# Patient Record
Sex: Female | Born: 1935 | Race: White | Hispanic: No | Marital: Married | Smoking: Never smoker
Health system: Southern US, Community
[De-identification: ages and names within clinical notes are randomized; demographics above are authoritative.]

## PROBLEM LIST (undated history)

## (undated) DIAGNOSIS — I119 Hypertensive heart disease without heart failure: Secondary | ICD-10-CM

## (undated) DIAGNOSIS — I480 Paroxysmal atrial fibrillation: Secondary | ICD-10-CM

## (undated) DIAGNOSIS — N183 Chronic kidney disease, stage 3 unspecified: Secondary | ICD-10-CM

## (undated) DIAGNOSIS — E119 Type 2 diabetes mellitus without complications: Secondary | ICD-10-CM

## (undated) DIAGNOSIS — M1990 Unspecified osteoarthritis, unspecified site: Secondary | ICD-10-CM

## (undated) DIAGNOSIS — S2231XA Fracture of one rib, right side, initial encounter for closed fracture: Secondary | ICD-10-CM

## (undated) DIAGNOSIS — J45909 Unspecified asthma, uncomplicated: Secondary | ICD-10-CM

## (undated) DIAGNOSIS — S4292XA Fracture of left shoulder girdle, part unspecified, initial encounter for closed fracture: Secondary | ICD-10-CM

## (undated) DIAGNOSIS — E785 Hyperlipidemia, unspecified: Secondary | ICD-10-CM

## (undated) DIAGNOSIS — M069 Rheumatoid arthritis, unspecified: Secondary | ICD-10-CM

## (undated) DIAGNOSIS — I639 Cerebral infarction, unspecified: Secondary | ICD-10-CM

## (undated) DIAGNOSIS — I2510 Atherosclerotic heart disease of native coronary artery without angina pectoris: Secondary | ICD-10-CM

## (undated) DIAGNOSIS — I5032 Chronic diastolic (congestive) heart failure: Secondary | ICD-10-CM

## (undated) DIAGNOSIS — K219 Gastro-esophageal reflux disease without esophagitis: Secondary | ICD-10-CM

## (undated) DIAGNOSIS — E669 Obesity, unspecified: Secondary | ICD-10-CM

## (undated) DIAGNOSIS — E039 Hypothyroidism, unspecified: Secondary | ICD-10-CM

## (undated) HISTORY — DX: Rheumatoid arthritis, unspecified: M06.9

## (undated) HISTORY — DX: Atherosclerotic heart disease of native coronary artery without angina pectoris: I25.10

## (undated) HISTORY — DX: Hypothyroidism, unspecified: E03.9

## (undated) HISTORY — DX: Cerebral infarction, unspecified: I63.9

## (undated) HISTORY — PX: OTHER SURGICAL HISTORY: SHX169

## (undated) HISTORY — DX: Hyperlipidemia, unspecified: E78.5

## (undated) HISTORY — DX: Fracture of left shoulder girdle, part unspecified, initial encounter for closed fracture: S42.92XA

## (undated) HISTORY — DX: Type 2 diabetes mellitus without complications: E11.9

## (undated) HISTORY — DX: Obesity, unspecified: E66.9

## (undated) HISTORY — PX: CORONARY ANGIOPLASTY: SHX604

## (undated) HISTORY — DX: Gastro-esophageal reflux disease without esophagitis: K21.9

## (undated) HISTORY — DX: Unspecified osteoarthritis, unspecified site: M19.90

---

## 1998-05-20 HISTORY — PX: OTHER SURGICAL HISTORY: SHX169

## 1999-02-02 ENCOUNTER — Ambulatory Visit (HOSPITAL_COMMUNITY): Admission: RE | Admit: 1999-02-02 | Discharge: 1999-02-03 | Payer: Self-pay | Admitting: Cardiology

## 1999-02-02 ENCOUNTER — Encounter: Payer: Self-pay | Admitting: Cardiology

## 2003-05-21 HISTORY — PX: OTHER SURGICAL HISTORY: SHX169

## 2003-08-21 ENCOUNTER — Inpatient Hospital Stay (HOSPITAL_COMMUNITY): Admission: EM | Admit: 2003-08-21 | Discharge: 2003-08-23 | Payer: Self-pay | Admitting: Cardiology

## 2004-03-29 ENCOUNTER — Ambulatory Visit: Payer: Self-pay | Admitting: Internal Medicine

## 2004-06-27 ENCOUNTER — Ambulatory Visit: Payer: Self-pay | Admitting: Family Medicine

## 2004-07-05 ENCOUNTER — Ambulatory Visit: Payer: Self-pay | Admitting: Internal Medicine

## 2004-07-19 ENCOUNTER — Ambulatory Visit: Payer: Self-pay

## 2004-07-19 ENCOUNTER — Ambulatory Visit: Payer: Self-pay | Admitting: Internal Medicine

## 2004-07-20 ENCOUNTER — Ambulatory Visit: Payer: Self-pay

## 2005-06-17 ENCOUNTER — Ambulatory Visit: Payer: Self-pay | Admitting: Internal Medicine

## 2005-08-12 ENCOUNTER — Ambulatory Visit: Payer: Self-pay | Admitting: Internal Medicine

## 2005-12-16 ENCOUNTER — Ambulatory Visit: Payer: Self-pay | Admitting: Family Medicine

## 2005-12-16 ENCOUNTER — Inpatient Hospital Stay (HOSPITAL_COMMUNITY): Admission: EM | Admit: 2005-12-16 | Discharge: 2005-12-19 | Payer: Self-pay | Admitting: Emergency Medicine

## 2006-05-28 ENCOUNTER — Ambulatory Visit: Payer: Self-pay | Admitting: Cardiology

## 2006-05-29 ENCOUNTER — Inpatient Hospital Stay (HOSPITAL_BASED_OUTPATIENT_CLINIC_OR_DEPARTMENT_OTHER): Admission: RE | Admit: 2006-05-29 | Discharge: 2006-05-29 | Payer: Self-pay | Admitting: Internal Medicine

## 2006-05-29 ENCOUNTER — Ambulatory Visit: Payer: Self-pay | Admitting: Internal Medicine

## 2006-06-04 ENCOUNTER — Ambulatory Visit (HOSPITAL_COMMUNITY): Admission: RE | Admit: 2006-06-04 | Discharge: 2006-06-05 | Payer: Self-pay | Admitting: Cardiology

## 2006-06-04 ENCOUNTER — Ambulatory Visit: Payer: Self-pay | Admitting: Cardiology

## 2006-07-22 ENCOUNTER — Ambulatory Visit: Payer: Self-pay | Admitting: Cardiology

## 2006-07-22 LAB — CONVERTED CEMR LAB
Bilirubin, Direct: 0.1 mg/dL (ref 0.0–0.3)
Cholesterol: 172 mg/dL (ref 0–200)
Direct LDL: 56.7 mg/dL
Total Bilirubin: 0.8 mg/dL (ref 0.3–1.2)
Total Protein: 7.3 g/dL (ref 6.0–8.3)
VLDL: 102 mg/dL — ABNORMAL HIGH (ref 0–40)

## 2006-10-02 ENCOUNTER — Ambulatory Visit: Payer: Self-pay | Admitting: Internal Medicine

## 2006-10-02 LAB — CONVERTED CEMR LAB
BUN: 22 mg/dL (ref 6–23)
CO2: 28 meq/L (ref 19–32)
Chloride: 104 meq/L (ref 96–112)
Creatinine, Ser: 0.8 mg/dL (ref 0.4–1.2)
GFR calc non Af Amer: 76 mL/min
Glucose, Bld: 153 mg/dL — ABNORMAL HIGH (ref 70–99)
Potassium: 4 meq/L (ref 3.5–5.1)
Sodium: 140 meq/L (ref 135–145)

## 2006-12-19 ENCOUNTER — Ambulatory Visit: Payer: Self-pay | Admitting: Internal Medicine

## 2007-01-01 ENCOUNTER — Ambulatory Visit: Payer: Self-pay | Admitting: Internal Medicine

## 2007-03-04 ENCOUNTER — Emergency Department (HOSPITAL_COMMUNITY): Admission: EM | Admit: 2007-03-04 | Discharge: 2007-03-04 | Payer: Self-pay | Admitting: Emergency Medicine

## 2008-07-22 ENCOUNTER — Ambulatory Visit: Payer: Self-pay | Admitting: Internal Medicine

## 2008-07-27 ENCOUNTER — Ambulatory Visit: Payer: Self-pay

## 2008-08-01 ENCOUNTER — Ambulatory Visit: Payer: Self-pay

## 2008-08-17 DIAGNOSIS — M069 Rheumatoid arthritis, unspecified: Secondary | ICD-10-CM | POA: Insufficient documentation

## 2008-08-17 DIAGNOSIS — Z8639 Personal history of other endocrine, nutritional and metabolic disease: Secondary | ICD-10-CM

## 2008-08-17 DIAGNOSIS — E119 Type 2 diabetes mellitus without complications: Secondary | ICD-10-CM | POA: Insufficient documentation

## 2008-08-17 DIAGNOSIS — I2510 Atherosclerotic heart disease of native coronary artery without angina pectoris: Secondary | ICD-10-CM | POA: Insufficient documentation

## 2008-08-17 DIAGNOSIS — I4891 Unspecified atrial fibrillation: Secondary | ICD-10-CM | POA: Insufficient documentation

## 2008-08-17 DIAGNOSIS — M179 Osteoarthritis of knee, unspecified: Secondary | ICD-10-CM | POA: Insufficient documentation

## 2008-08-17 DIAGNOSIS — M1710 Unilateral primary osteoarthritis, unspecified knee: Secondary | ICD-10-CM | POA: Insufficient documentation

## 2008-08-17 DIAGNOSIS — E785 Hyperlipidemia, unspecified: Secondary | ICD-10-CM | POA: Insufficient documentation

## 2008-08-17 DIAGNOSIS — K219 Gastro-esophageal reflux disease without esophagitis: Secondary | ICD-10-CM | POA: Insufficient documentation

## 2008-08-17 DIAGNOSIS — Z862 Personal history of diseases of the blood and blood-forming organs and certain disorders involving the immune mechanism: Secondary | ICD-10-CM | POA: Insufficient documentation

## 2008-08-22 ENCOUNTER — Encounter: Payer: Self-pay | Admitting: Internal Medicine

## 2008-08-22 ENCOUNTER — Ambulatory Visit: Payer: Self-pay | Admitting: Internal Medicine

## 2008-08-22 DIAGNOSIS — R0602 Shortness of breath: Secondary | ICD-10-CM | POA: Insufficient documentation

## 2008-08-22 DIAGNOSIS — E032 Hypothyroidism due to medicaments and other exogenous substances: Secondary | ICD-10-CM | POA: Insufficient documentation

## 2008-08-22 LAB — CONVERTED CEMR LAB: Triglycerides: 167 mg/dL — ABNORMAL HIGH (ref 0.0–149.0)

## 2008-08-29 ENCOUNTER — Telehealth: Payer: Self-pay | Admitting: Internal Medicine

## 2009-01-04 ENCOUNTER — Encounter (INDEPENDENT_AMBULATORY_CARE_PROVIDER_SITE_OTHER): Payer: Self-pay | Admitting: *Deleted

## 2009-01-26 ENCOUNTER — Ambulatory Visit: Payer: Self-pay | Admitting: Cardiovascular Disease

## 2009-01-26 ENCOUNTER — Inpatient Hospital Stay (HOSPITAL_COMMUNITY): Admission: AD | Admit: 2009-01-26 | Discharge: 2009-01-28 | Payer: Self-pay | Admitting: Cardiology

## 2009-02-06 ENCOUNTER — Telehealth: Payer: Self-pay | Admitting: Cardiovascular Disease

## 2009-02-07 ENCOUNTER — Encounter: Payer: Self-pay | Admitting: Cardiology

## 2009-02-09 ENCOUNTER — Telehealth: Payer: Self-pay | Admitting: Cardiovascular Disease

## 2009-02-20 ENCOUNTER — Ambulatory Visit: Payer: Self-pay | Admitting: Cardiovascular Disease

## 2009-06-19 ENCOUNTER — Ambulatory Visit: Payer: Self-pay | Admitting: Cardiovascular Disease

## 2009-07-19 ENCOUNTER — Ambulatory Visit: Payer: Self-pay | Admitting: Cardiovascular Disease

## 2009-09-25 ENCOUNTER — Emergency Department: Payer: Self-pay | Admitting: Emergency Medicine

## 2010-03-19 ENCOUNTER — Telehealth: Payer: Self-pay | Admitting: Cardiovascular Disease

## 2010-06-19 NOTE — Progress Notes (Signed)
  Phone Note Call from Patient   Caller: Patient Summary of Call: Patient called-BP elevated.  Not feeling well.  Lives near [place] office now.  Advised to call that office and make f/u appt. for BP management. Initial call taken by: Dessie Coma  LPN,  March 19, 2010 9:28 AM

## 2010-08-24 LAB — CBC
HCT: 34.9 % — ABNORMAL LOW (ref 36.0–46.0)
HCT: 37.9 % (ref 36.0–46.0)
Hemoglobin: 12.7 g/dL (ref 12.0–15.0)
MCHC: 33.5 g/dL (ref 30.0–36.0)
MCHC: 34.3 g/dL (ref 30.0–36.0)
Platelets: 236 10*3/uL (ref 150–400)
Platelets: 263 10*3/uL (ref 150–400)
WBC: 6.6 10*3/uL (ref 4.0–10.5)
WBC: 7.9 10*3/uL (ref 4.0–10.5)

## 2010-08-24 LAB — LIPID PANEL
LDL Cholesterol: 62 mg/dL (ref 0–99)
Total CHOL/HDL Ratio: 4.1 RATIO

## 2010-08-24 LAB — CARDIAC PANEL(CRET KIN+CKTOT+MB+TROPI)
CK, MB: 1.9 ng/mL (ref 0.3–4.0)
CK, MB: 2.2 ng/mL (ref 0.3–4.0)
CK, MB: 2.3 ng/mL (ref 0.3–4.0)
Relative Index: INVALID (ref 0.0–2.5)
Relative Index: INVALID (ref 0.0–2.5)
Troponin I: 0.02 ng/mL (ref 0.00–0.06)

## 2010-08-24 LAB — BASIC METABOLIC PANEL
BUN: 16 mg/dL (ref 6–23)
Calcium: 8.7 mg/dL (ref 8.4–10.5)
Chloride: 103 mEq/L (ref 96–112)
Creatinine, Ser: 0.9 mg/dL (ref 0.4–1.2)
GFR calc Af Amer: >60 mL/min (ref >60)
GFR calc non Af Amer: >60 mL/min (ref >60)

## 2010-08-24 LAB — COMPREHENSIVE METABOLIC PANEL
ALT: 14 U/L (ref 0–35)
AST: 16 U/L (ref 0–37)
Alkaline Phosphatase: 72 U/L (ref 39–117)
BUN: 16 mg/dL (ref 6–23)
Calcium: 9.3 mg/dL (ref 8.4–10.5)
Creatinine, Ser: 0.95 mg/dL (ref 0.4–1.2)
Sodium: 140 mEq/L (ref 135–145)

## 2010-08-24 LAB — GLUCOSE, CAPILLARY
Glucose-Capillary: 182 mg/dL — ABNORMAL HIGH (ref 70–99)
Glucose-Capillary: 328 mg/dL — ABNORMAL HIGH (ref 70–99)

## 2010-08-24 LAB — TSH: TSH: 2.929 u[IU]/mL (ref 0.350–4.500)

## 2010-08-24 LAB — BRAIN NATRIURETIC PEPTIDE: Pro B Natriuretic peptide (BNP): 71 pg/mL (ref 0.0–100.0)

## 2010-08-24 LAB — APTT: aPTT: 26 seconds (ref 24–37)

## 2010-10-02 NOTE — Assessment & Plan Note (Signed)
[facility]                            CARDIOLOGY OFFICE NOTE   NAME:Delillo, JONNELLE LAWNICZAK                     MRN:          [id number]  DATE:10/02/2006                            DOB:          1935/01/11    IDENTIFICATION:  [name] [name] is a 76 year old woman, who was last seen in  cardiology clinic back in March by Wende Bushy.  I had previously  seen her back in 2007.  She has been seen by a couple of days in  between.   The patient has a history of PTCA with cutting balloon for in-stent  restenosis to the mid-RCA.  She initially had a tri-star tent placed in  2000 by Dr. Juanda Chance, and a cutting balloon angioplasty again, as noted  above, by Dr. Chales Abrahams in 2005.   When she was seen in March, she was told to stop her Toprol, to see if  her fatigue improved.  She did not do that.  She is on the same  medicines.  She gives out, she says, some with activity, takes activity  as tolerated.   She denies chest pain, no dizziness.  Notes occasional fluttering in her  chest that lasts a couple of seconds.   CURRENT MEDICATIONS:  1. Bisoprolol 5/6.25 q. day.  2. Felodipine 5 q. day.  3. Fluorometholone 0.1.  4. Lasix 40 b.i.d.  5. Isorbid 30 q. day.  6. Levothyroxine 175 mcg q. day.  7. Lisinopril 10 q. day.  8. Lovastatin 40 q. day.  9. Metformin 500 b.i.d.  10.Metoprolol 25 q. day.  11.Novolin as directed.  12.Simvastatin 80 q. day.  13.Propoxyphene 100/650 q. day.   PHYSICAL EXAMINATION:  GENERAL:  Patient is in no distress.  VITAL SIGNS:  Blood pressure 130/70, pulse is 64, weight 252, stable,  (Patient notes her weight is down at home by home scales)  LUNGS:  Clear to auscultation.  CARDIAC:  Regular rate and rhythm, S1, S2, no S3, no murmurs.  ABDOMEN:  Benign, obese.  EXTREMITIES:  No edema.   IMPRESSION:  1. Coronary artery disease, clinically stable.  I am not convinced      that her shortness of breath is angina, most likely  deconditioning.      Would continue.  I am not sure why she is on 2 beta blockers.  I      will stop the bisoprolol, go up on her metoprolol 25 b.i.d. and see      her back in 6 weeks for blood pressure evaluation.  Will check a      BMET today with fluttering.  2. Fluttering as noted above.  3. Dyslipidemia.  Continue on Zocor.  We will need to follow up on      this.     Pricilla Riffle, MD, [facility]     PVR/MedQ  DD: 10/02/2006  DT: 10/02/2006  Job #: [id number]   cc:   Cheri Rous

## 2010-10-02 NOTE — Assessment & Plan Note (Signed)
[facility]                        [place] CARDIOLOGY OFFICE NOTE   NAME:[name] [name], [name] [name]                     MRN:          [id number]  DATE:01/26/2009                            DOB:          [birth year]    Office note and admission history and physical.   CHIEF COMPLAINT:  Chest discomfort.   HISTORY OF PRESENT ILLNESS:  [name] [name] is a 76 year old white female  with a past medical history significant for coronary artery disease  status post PCI to the right coronary artery in 2000 with two subsequent  episodes of in-stent stenosis requiring cutting balloon interventions in  2005 and 2008 who is presenting now with a 6-day history of chest  discomfort with exertion and some arm numbness and left face numbness at  rest.  The patient states she has been in her normal state of health  until last Friday when she had acute onset of left arm numbness and  numbness in her tongue lasting approximately 1-2 hours.  There was some  associated shortness of breath.  Over the subsequent several days she  has been experiencing substernal chest discomfort with some radiation to  her left arm associated with some nausea and shortness of breath.  This  chest discomfort predominately occurs with exertion and is relieved with  rest.  Prior to last Friday, the patient had not had any chest  discomfort in quite some time.  She states this pain is similar but not  exactly the same as pain that led to left heart catheterizations in the  past.  Patient also endorses some mild bilateral lower extremity edema  x1 week.  She denies any PND, but states she is more comfortable  sleeping upright than she is lying down.  The patient denies any  syncope, dizziness, hematochezia, melena or other bleeding issues.   PAST MEDICAL HISTORY:  1. Coronary artery disease status post PCI to the right coronary      artery in 2000 with a TriStar stent as part of the POST-IT      protocol.   She had a left heart catheterization in April 2005 and      had in-stent restenosis of that stent and had a 3.5-mm x 15-mm      cutting balloon angioplasty performed.  In January 2008, the      patient again was admitted with chest discomfort and was found to      have in-stent restenosis of that right coronary artery stent and      again underwent a 3.5-mm x 15-mm cutting balloon with a residual      20% stenosis.  During that heart catheterization a distal right      coronary artery lesion of 40% was also found.  In addition, the      patient had a 30% proximal LAD lesion, a 50% to 60% mid LAD lesion      and a 20% ostial circumflex lesion.  2. Diabetes.  3. Obesity.  4. Hypertension.  5. Hyperlipidemia.  6. Chronic urinary tract infections.  7. History of atrial flutter.  8. History of depression.  9. History of gastroesophageal reflux disease.  10.History of hypothyroidism.   SOCIAL HISTORY:  No tobacco, no alcohol.  The patient lives with one of  her children and their spouse.   FAMILY HISTORY:  Significant for esophageal cancer in the mother.   ALLERGIES:  No known drug allergies.   MEDICATIONS:  1. Aspirin 81 mg daily.  2. Lisinopril 10 mg daily.  3. Simvastatin 80 mg daily.  4. Amlodipine 5 mg daily.  5. Lasix 40 mg daily.  6. Fish Oil 1000 mg daily.  7. Novolin N 25 units every morning and 12 units every evening.  8. Regular insulin p.r.n.  9. Cephalexin 250 mg daily.  10.Levothyroxine 150 mcg daily.  11.Isosorbide p.r.n.  12.Metformin 1000 mg b.i.d.  13.Potassium supplementation.  14.Nabumetone 750 mg daily p.r.n.   REVIEW OF SYSTEMS:  As in HPI.  All other systems were reviewed and are  negative.   PHYSICAL EXAM:  The patient has a blood pressure of 129/63, pulse 64,  she weighs 243 pounds, saturating 98% on room air.  GENERAL:  No acute distress.  HEENT:  Nonfocal.  NECK:  Supple.  There is no JVD.  There are no carotid bruits.  HEART:  Regular rate and  rhythm without murmur, rub or gallop.  LUNGS:  Clear to auscultation bilaterally.  ABDOMEN:  Soft, nontender, nondistended.  EXTREMITIES:  There is trace bilateral lower extremity edema.  SKIN:  Warm and dry.  There is no rash.  NEURO:  Nonfocal.  PSYCHIATRIC:  The patient is appropriate with normal levels of insight.  MUSCULOSKELETAL:  The patient has 5/5 bilateral upper and lower  extremity strength.   Electrocardiogram, independently reviewed by myself, demonstrates a  normal sinus rhythm with delayed R wave progression and baseline  artifact.  There is no ST or T-wave abnormalities concerning for  ischemia.   Review of the patient's medical record as above in HPI.   ASSESSMENT:  A 76 year old white female with known coronary artery  disease presenting with symptoms times one week that are concerning for  unstable angina.  Patient also is endorsing increased lower extremity  and perhaps increased dyspnea over the past week that is concerning for  potential left ventricular dysfunction or worsening diastolic  dysfunction that may be associated with ongoing ischemia.   PLAN:  The patient will be admitted to [facility] and ruled out  for acute myocardial infarction.  She will be made n.p.o. after midnight  and will undergo a left heart catheterization with possible intervention  tomorrow.  We will continue her above medications with the exception of  her diabetes medications, instead we will place her on sliding scale  insulin.  She will also be placed on a low dose B-blocker and NTG prn.  We will also hold her proton pump inhibitor as the patient will likely  need percutaneous intervention, and therefore be placed on Plavix.  She  will be placed on Lovenox 40 mg [place] daily for DVT prophylaxis.  Anticoagulation with heparin or full dose lovenox should be considered  if the patients hemoglobin is stable, especially if the chest discomfort  were to reoccur.  The patient's  family will transport her from the  office to [facility] as she is instructed not to drive.     Brayton El, MD  Electronically Signed    SGA/MedQ  DD: 01/26/2009  DT: 01/26/2009  Job #: [id number]

## 2010-10-02 NOTE — Assessment & Plan Note (Signed)
[facility]                        [place] CARDIOLOGY OFFICE NOTE   NAME:[name] [name], [name] [name]                     MRN:          [id number]  DATE:02/20/2009                            DOB:          1934/12/30    PROBLEM LIST:  1. Coronary artery disease.  The patient's most recent heart      catheterization was on January 27, 2009.  At that time, she had      an 80% restenosis of the right coronary artery with subsequent      cutting balloon.  In December 2009, a cutting balloon was previosly      used with a 20% residual stenosis.  The patient also had a mid LAD      lesion 50%.  Her ejection fraction was 60%.  2. Diabetes.  3. Hyperlipidemia.  LDL in April 2010 was 64, HDL 39, triglycerides      167.  4. History of atrial flutter.  5. Depression.  6. Thyroid disorder.  7. Gastroesophageal reflux disease.   INTERVAL HISTORY:  The patient's last office visit, she was admitted to  [facility] for unstable angina.  The patient underwent left heart  catheterization with cutting balloon to in-stent restenosis of the right  coronary artery.  The patient's post procedure course was uneventful.  The patient states that since she has returned to home she has not had  any recurrence of the chest discomfort.  She also denies any PND,  orthopnea, or syncope.  The patient states that she does feel somewhat  tired and of depressed mood.  She has been compliant with her aspirin  and Plavix, although it took her a few days to get the Plavix.   PHYSICAL EXAMINATION:  VITAL SIGNS:  The patient has a blood pressure of  131/58, pulse 67, weight 242 pounds which is the same she weighed in  earlier this month.  She is sating 95% on room air.  GENERAL:  She is in no acute distress.  NECK:  Supple.  There is no JVD in the seated position.  HEART:  Regular rate and rhythm without murmur, rub, or gallop.  LUNGS:  Clear to auscultation bilaterally.  ABDOMEN:  Soft,  nontender, nondistended.  EXTREMITIES:  Without edema.  SKIN:  Warm and dry.  PSYCH:  The patient is appropriate with normal levels of insight, but  does appear as somewhat depressed mood.   ASSESSMENT AND PLAN:  The patient is doing well from a cardiac  standpoint.  She has had no recurrence of angina-like symptoms after her  percutaneous intervention.  Her LDL was at goal earlier on in this year  and she is currently on the same medical therapy.  We recommend she  continue aspirin and Plavix.  She can discontinue the Plavix after 1  month and just take aspirin as her only antiplatelet agent thereafter.  She is following up with her primary care physician later in the week to  discuss the issues regarding depressed mood.  The patient also  states she gets very little sun light.  We recommend that vitamin D  supplementation  may be  helpful for her.  We will see the patient back in 3 months' time, and  she has to call our office in the interim if any problems were to arise.     Brayton El, MD  Electronically Signed    SGA/MedQ  DD: 02/20/2009  DT: 02/21/2009  Job #: [id number]

## 2010-10-02 NOTE — Assessment & Plan Note (Signed)
[facility]                        [place] CARDIOLOGY OFFICE NOTE   NAME:[name] [name], [name] [name]                     MRN:          [id number]  DATE:06/19/2009                            DOB:          1934/08/04    PROBLEM LIST:  1. Coronary artery disease.  Left heart catheterization on January 27, 2009, showed 80% restenosis of the right coronary artery and at      that time, she underwent cutting balloon angioplasty.  She had a      mid left anterior descending lesion at 50%.  Her ejection fraction      was 60%.  2. Diabetes.  3. Hyperlipidemia.  LDL in April 2010 was 64, HDL 39, TG 167.  4. History of aflutter.  5. Depression.  6. Thyroid disorder.  7. Gastroesophageal reflux disease.   INTERVAL HISTORY:  The patient states since her last visit, she has had  infrequent chest discomfort.  She states that it occurs less than once a  week and does not alter in her quality of life.  She endorses of  psychosocial stressors and feels that she is depressed.  She has stopped  taking her Plavix since the last visit, which is reasonable, as she did  not have a stent placed at her last heart cath.   PHYSICAL EXAMINATION:  VITAL SIGNS:  Blood pressure is 120/64, pulse is  63, sating 98% on room air, and she weighs 236 pounds.  GENERAL:  No acute distress.  HEENT:  Normocephalic, atraumatic.  NECK:  Supple.  HEART:  Regular rate and rhythm without murmur, rub or gallop.  LUNGS:  Clear bilaterally.  ABDOMEN:  Soft, nontender, nondistended.  EXTREMITIES:  Without edema.  SKIN:  Warm and dry.   ASSESSMENT AND PLAN:  1. Coronary artery disease.  The patient is not having any significant      angina.  She should continue on medications as listed in the chart.  2. Hypertension.  Her blood pressure is under very good control.  She      should continue on her antihypertensive therapy.  3. Hyperlipidemia.  She had a good lipid profile in April 2010 and   this should be rechecked at      her next office visit.  In the interim, she should continue on      simvastatin 80 mg daily.  We will see the patient back in 4 months'      time.     Brayton El, MD  Electronically Signed    SGA/MedQ  DD: 06/19/2009  DT: 06/20/2009  Job #: [id number]

## 2010-10-02 NOTE — Assessment & Plan Note (Signed)
[facility]                        [place] CARDIOLOGY OFFICE NOTE   NAME:[name] [name], [name] [name]                     MRN:          [id number]  DATE:07/19/2009                            DOB:          May 14, 1935    PROBLEM LIST:  1. Coronary artery disease, left heart catheterization on January 27, 2009, showed 80% of restenosis of the right coronary artery at      that time she underwent cutting balloon angioplasty.  She also had      mid left anterior descending lesion of 50%, her ejection fraction      was 60%.  2. Diabetes.  3. Hyperlipidemia.  LDL in April 2010 was 64, HDL 39, triglycerides      167.  4. History of atrial flutter.  5. Depression.  6. Thyroid disorder.  7. Gastroesophageal reflux disease.   INTERVAL HISTORY:  The patient states that 6 days prior to today she  experienced left arm tingling that involved to the left shoulder pain  and chest pain in the evening.  She states that she is felt like she was  having a heart attack and took 2 aspirins and an isosorbide and went to  sleep.  When she woke up at 3 o'clock the chest discomfort was still  there and over the next day it was much improved.  She saw her primary  care doctor 2 days after.  At that time, her troponin was completely  within normal limits and her EKG was unchanged.  She states that over  the past week she has had increased psychosocial stressors and has  occasionally felt uneasy feeling in her chest.  She endorses some  intermittent chest discomfort, but states it is usually brought on by  stressful interactions.  This morning, the patient states she did not  take her medications as she was in hurry to get out the door the door.   REVIEW OF SYSTEMS:  The patient endorses occasional fleeting  palpitations.   PHYSICAL EXAMINATION:  VITAL SIGNS:  Today, blood pressure is 158/80,  pulse is 59, sating 97% on room air, and she weighs 238 pounds.  GENERAL:  No acute  distress.  HEENT:  Normocephalic and atraumatic.  HEART:  Regular rate and rhythm without murmur, rub, or gallop.  LUNGS:  Clear bilaterally.  ABDOMEN:  Soft, nontender, nondistended.  EXTREMITIES:  Trace bilateral lower extremity edema.   EKG taken today in the office independently interrupted by myself  demonstrates normal sinus rhythm with T-wave inversions in 1 AVL that  are old.  Compared with EKG taken earlier in the week in her primary  care doctor's office, there is no significant change.  There is also no  significant change from EKG dated as January 26, 2009.   Review of the patient's labs, drawn at the primary care doctor's office,  troponin was within normal limits.  A CBC was within normal limits and a  BMP within normal limits with the exception of a glucose of 325.   ASSESSMENT AND PLAN:  1. Coronary artery disease.  The patient is  having some chest      discomfort that appears to be brought on by a psychosocial      stressors.  She also appears to have been intermittently compliant      with her medications.  We stressed the importance of taking her      medications on a regular basis.  I have also increased her      isosorbide dinitrate dose from 5 mg daily to 10 mg b.i.d.  She did      increase her aspirin from 81 mg to 162 mg daily.  And she should      continue on the amlodipine 5 mg daily, simvastatin 40 mg daily, and      lisinopril 10 mg daily.  She will contact our office early next      week to let us know if the chest discomfort has improved.  She will      call 911 if the chest discomfort comes on and does not get relieved      with sublingual nitroglycerin.  2. Hypertension.  The patient's blood pressure is not under control      today likely secondary to noncompliance with medications.  We      stress compliance.  3. Hyperlipidemia.  She should continue on simvastatin 80 mg daily.      We will recheck lipid profile the next office visit.  4.  Palpitations.  The patient appears to have a remote atrial flutter      and she endorses occasional palpitations.  We will defer Holter      monitor to the next office visit as she is currently under      considerable psychosocial stressors.      Brayton El, MD  Electronically Signed    SGA/MedQ  DD: 07/19/2009  DT: 07/20/2009  Job #: [id number]   cc:   [facility]

## 2010-10-02 NOTE — Assessment & Plan Note (Signed)
[facility]                            CARDIOLOGY OFFICE NOTE   NAME:Ernster, NAW LASALA                     MRN:          [id number]  DATE:07/22/2008                            DOB:          1935-01-10    IDENTIFICATION:  [name] [name] is a 76 year old woman, I last saw her back  in August 2008.  She has a history of CAD (status post PTCA with cutting  balloon angioplasty for in-stent restenosis to the RCA).   She called in because she has been experiencing more shortness of  breath.  She says that if she tries to go cross the house she gets short  of breath and feels tight.  She says this is a different from 6 months  ago.  She has cut back or slow down her activity because of this.   CURRENT MEDICINES:  1. Novolin ER 24 units in the a.m. and 12 units in the p.m.  2. Meclizine 25 daily.  3. Lisinopril 10.  4. Metformin 500.  5. Synthroid 200 daily.  6. Cephalexin 250.  7. Nabumetone 750.  8. Furosemide 40 b.i.d.  9. Isorbid 5 mg b.i.d.?  10.Klor-Con 20 b.i.d.  11.Amlodipine 5.   PHYSICAL EXAMINATION:  GENERAL:  On exam, the patient is in no distress  at rest.  VITAL SIGNS:  Blood pressure is 122/63, pulse is 71 and regular, weight  257 which is up 6 pounds from 2008.  LUNGS:  Clear.  She is moving air well.  There no rales or wheezes.  CARDIAC:  Regular rate and rhythm.  S1 and S2.  No S3.  No significant  murmur.  ABDOMEN:  Benign.  EXTREMITIES:  Good distal pulses.  Trace edema.   IMPRESSION:  1. Coronary artery disease and shortness of breath.  I am concerned      again her anatomy back in January 2008, she had stenting to the      right coronary artery that underwent in-stent restenosis, was re-      angioplasty in 2005, and again needed cutting balloon angioplasty      in 2008.  Other vessel shows 30% proximal left anterior descending      lesion, 50-60% mid left anterior descending lesion, and circumflex      had a 20% ostial lesion.   Her symptoms are worrisome, I discussed      with her noninvasive versus invasive evaluation.  We will go ahead      and plan for a Myoview as she was prefer this approach.  I told her      if she has a spell that is worrisome she should of course go to the      emergency room first.  She has nitroglycerin to take as needed.  2. Coronary artery disease as noted above.  3. Dyslipidemia.  We will need to check on fasting lipids.  Note, she      is not on a statin at present, had been on lovastatin.  4. History of fluttering, no complaints significant at present.   I will set to tentatively see  the patient in 1 month, but will be in  touch with her test results.   ADDENDUM:  CBC will be checked today.  Thyroid dose was adjusted.      Pricilla Riffle, MD, [facility]  Electronically Signed    PVR/MedQ  DD: 07/24/2008  DT: 07/25/2008  Job #: [id number]   cc:   Gordy Savers, MD

## 2010-10-02 NOTE — Assessment & Plan Note (Signed)
[facility]                            CARDIOLOGY OFFICE NOTE   NAME:[name] [name], FLOETTA BRICKEY                     MRN:          [id number]  DATE:12/19/2006                            DOB:          [birth year]    IDENTIFICATION:  Ms. Milner is a 76 year old woman, history of CAD (PTCA  with cutting balloon for in-stent restenosis to the mid RCA).  Last  cardiac catheterization again was done in 2008 when she had this done.  I last saw her in May.   When I saw her last I stopped bisoprolol and went up on her metoprolol  to 25 b.i.d.   In the interval she one, realized she was on 2 statins and has stopped,  said she is feeling some better.   She walks a lot, goes up and down steps.  Has noted some arm discomfort,  occasional chest pressure.  She will occasionally feel her heart quiver.  About 2 weeks ago she had some chest pain not associated with any  particular activity.  Actually went to bed with it and woke up with it,  still having the pain.  Took an aspirin, a nitroglycerin the night  before.   On talking to her about her quivering sensation it can last seconds to  minutes, 1-2 times per week.  Occasional dizziness.   Overall on questioning her she thinks she may have felt the same as she  did before she had her intervention done in January.   CURRENT MEDICATIONS:  1. Felodipine 5.  2. Fluorometholone 0.1.  3. Furosemide 40 daily.  4. Isosorbide 30 daily.  5. Synthroid 175 daily.  6. Lisinopril 10 daily.  7. Simvastatin 80.  8. Metoprolol 25 b.i.d.  9. Metformin 500 b.i.d.  10.Propoxyphene.  11.Novolin as directed.   PHYSICAL EXAMINATION:  Patient is in no distress.  Blood pressure  132/70, pulse is 58 and regular, weight 251.  LUNGS:  Clear.  CARDIAC:  Regular rate and rhythm, S1-S2, no S3, no significant murmurs.  ABDOMEN:  Benign.  EXTREMITIES:  No edema.   IMPRESSION:  1. Coronary artery disease is concerning for her symptoms, I  would      schedule a Myoview to rule out a significant ischemia especially in      the right distribution.  2. Dyslipidemia, on 1 statin now,  will need to follow up.  3. Fluttering, not sure what this represents, very short lived.      Continue current medicines for now.  Will check on Myoview, will      also check BMET, BNP, TSH.  I will be in touch with the patient      after seeing the test results, tentative followup for the fall.     Pricilla Riffle, MD, [facility]  Electronically Signed    PVR/MedQ  DD: 12/19/2006  DT: 12/20/2006  Job #: [id number]   cc:   Cheri Rous

## 2010-10-05 NOTE — Discharge Summary (Signed)
NAME:  [name] [name], [name] [name]                        ACCOUNT NO.:  [id number]   MEDICAL RECORD NO.:  [id number]                   PATIENT TYPE:  INP   LOCATION:  2921                                 FACILITY:  [facility]   PHYSICIAN:  Willa Rough, M.D.                  DATE OF BIRTH:  1935-05-10   DATE OF ADMISSION:  08/21/2003  DATE OF DISCHARGE:  08/23/2003                                 DISCHARGE SUMMARY   PROCEDURES:  1. Cardiac catheterization.  2. Coronary arteriogram.  3. Left ventriculogram.  4. PTCA and stent x1 for in-stent restenosis.  5. CT of the chest.   HOSPITAL COURSE:  [name] [name] is a 76 year old female with a history of  coronary artery disease.  She had a cardiac catheterization with a stent to  the RCA for 90% stenosis in September of 2000.  She had onset of 10/10 chest  pain at 7:30 on the day of admission.  It started in the left hand and  forearm and moved to the substernal area of her chest.  She took sublingual  nitroglycerin at home that burned under her tongue but did not help the  pain.  She also took aspirin.  She went to the hospital by car and at  [facility] had IV nitroglycerin, heparin and Integrilin.  She had  morphine which helped more than anything else.  She was unable to qualify  the pain further but it was a 6/10 once she was medicated.  She had never  had this pain before.  It was associated with shortness of breath, nausea,  and diaphoresis.  She was admitted for further evaluation and treatment.   Her enzymes were negative for MI and her pain was controlled with  medication.  It was felt that cardiac catheterization was indicated to  follow-up evaluate her anatomy and this was performed on August 22, 2003.  The  cardiac catheterization showed nonobstructive coronary artery disease in the  LAD, ramus intermedius and circumflex at 30 to 40%.  There was 70% in-stent  restenosis in the RCA and this was treated with PTCA, reducing the stenosis  to  less than 10% with TIMI-3 flow.  Her EF was greater than 55% with no MR.   There was concern still for other causes of chest pain so she had a CT  performed to rule out dissection.  There was no evidence of pulmonary  embolus and no evidence of pneumonia or other acute chest process.  There  was concern that the pain was GI in origin and she was started on a proton  pump inhibitor instead of an H2 blocker.  On August 23, 2003, she had no chest  pain or shortness of breath.  Post procedure labs were within normal limits.  She is to continue her proton pump inhibitor and follow up with Dr. Pricilla Riffle as well as with Dr. Allena Katz.  [name] [name] was considered stable for  discharge on August 23, 2003.   LABORATORY DATA:  Hemoglobin 11.7, hematocrit 33.9, wbc 7.3, platelets 247.  Sodium 137, potassium 3.6, chloride 105, CO2 27, BUN 17, creatinine 0.9,  glucose 247.   Hemoglobin A1C 10.2.   Lipid profile with total cholesterol 177, triglycerides 198, HDL 39, LDL 98.  Iron and TIBC as well as serum ferritin pending at the time of dictation.   CONDITION ON DISCHARGE:  Improved.   DISCHARGE DIAGNOSES:  1. Chest pain, no significant abnormality on chest CT, status post     percutaneous transluminal coronary angioplasty to the right coronary     artery for in-stent restenosis.  2. Status post percutaneous transluminal coronary angioplasty and stent to     the right coronary artery in September of 2000.  3. History of hypothyroidism.  4. Gastroesophageal reflux disease.  5. Depression.  6. History of hemorrhoids.  7. Diabetes.  8. Hypertension.  9. Hyperlipidemia.  10.      Obesity.  11.      Status post cataract surgery, partial hysterectomy, and knee     surgery.   DISCHARGE INSTRUCTIONS:  1. Her activity level is to include no strenuous activity for two days.  2. She is to stick to a low fat, diabetic diet.  3. She is to call the office for problems with the catheterization site.  4. She  has a follow-up appointment with Dr. Dietrich Pates and she also is to     follow up with Dr. Allena Katz.   DISCHARGE MEDICATIONS:  1. Glucophage is to be restarted on August 25, 2003.  2. Nexium 40 mg daily.  3. Humibid p.r.n.  4. Lopid 600 mg b.i.d.  5. K-Dur 20 mEq b.i.d.  6. Imdur 30 mg daily.  7. Lasix 40 mg b.i.d.  8. Levoxyl 200 mcg daily.  9. Atenolol 25 mg daily.  10.      Accupril 40 mg daily.  11.      Lipitor 40 mg daily.  12.      Zyrtec 10 mg daily.  13.      Zoloft 100 mg daily.  14.      Actos 45 mg daily.  15.      Naprosyn and Tylenol No. 3 as prior to admission.  16.      Norvasc 10 mg daily.  17.      70/30 insulin 35 units a.m. and 15 p.m.  18.      Ranitidine is on hold for now.      Theodore Demark, P.A. [facility]                  Willa Rough, M.D.    RB/MEDQ  D:  08/23/2003  T:  08/24/2003  Job:  [id number]   cc:   Dr. Marcelene Butte [facility]  [place], [place]

## 2010-10-05 NOTE — Cardiovascular Report (Signed)
NAMEDEMRI, POULTON              ACCOUNT NO.:  [id number]   MEDICAL RECORD NO.:  [id number]          PATIENT TYPE:  OIB   LOCATION:  6525                         FACILITY:  [facility]   PHYSICIAN:  Everardo Beals. Juanda Chance, MD, FACCDATE OF BIRTH:  Jul 13, 1934   DATE OF PROCEDURE:  06/04/2006  DATE OF DISCHARGE:                            CARDIAC CATHETERIZATION   PROCEDURE:  Percutaneous intervention.   Primary care physician and by Dr. Cheri Rous.  Cardiologist, Dr.  Dietrich Pates.   CLINICAL HISTORY:  Ms. Keckler is 76 years old and has hypertension,  hyperlipidemia and diabetes.  She had a TriStar stent placed in 2000 in  the right coronary artery by myself.  She had cutting balloon  angioplasty for in-stent restenosis in 2005 by Dr. Chales Abrahams.  She recently  developed recurrent exertional chest pain and was catheterized in the  outpatient laboratory by Dr. Gala Romney and found to have moderately  tight in-stent restenosis estimated at 80%.  She was brought in today  for intervention.   PROCEDURE:  The procedure was performed via the right femoral stent  using an arterial sheath and a 6-French JR-4 guiding catheter with side  holes.  A front wall arterial puncture was performed and Omnipaque  contrast was used.  The patient given Angiomax bolus and infusion and  was given a 600 mg of Plavix.  We navigated a Prowater wire down the  right coronary artery across the lesion in the mid right coronary artery  without difficulty.  We then used a 3.5 x 15 mm cutting balloon and  performed approximately 9 inflations within and just out the side of the  proximal and distal edges of the stent with pressures up to 12  atmospheres for 30 seconds.  We then used a 3.5 x 15-mm Quantum Maverick  and performed 2 inflations up to 22 atmospheres for 30 seconds.  Final  diagnostic study was then performed through the guiding catheter.  The  patient tolerated the procedure well and left the laboratory in  satisfactory  condition.   RESULTS:  Initially the stenosis in the mid right coronary within the  stent was 80%.  Following PTCA, this improved to less than 20%.   CONCLUSION:  Successful percutaneous transluminal coronary angioplasty  (cutting balloon) of the in-stent restenotic lesion in the mid-right  coronary with improvement in stenting from 80% to less than 20%.   DISPOSITION:  The patient was returned to the [facility] unit for  further observation.   ADDENDUM:  We which we chose to use a cutting balloon rather than a drug-  eluting stent because of the patient's very uncertain compliance with  Plavix.  However, the vessel is a large-caliber vessel and I do not  think the recurrence rate will be prohibitive.      Bruce Elvera Lennox Juanda Chance, MD, [facility]  Electronically Signed    BRB/MEDQ  D:  06/04/2006  T:  06/05/2006  Job:  [id number]

## 2010-10-05 NOTE — Cardiovascular Report (Signed)
NAME:  [name] [name]                        ACCOUNT NO.:  [id number]   MEDICAL RECORD NO.:  [id number]                   PATIENT TYPE:  INP   LOCATION:  2921                                 FACILITY:  [facility]   PHYSICIAN:  Veneda Melter, M.D.                   DATE OF BIRTH:  12-29-1934   DATE OF PROCEDURE:  08/22/2003  DATE OF DISCHARGE:  08/23/2003                              CARDIAC CATHETERIZATION   PROCEDURES PERFORMED:  1. Left heart catheterization.  2. Left ventriculogram.  3. Selective coronary angiography.  4. Percutaneous transluminal coronary angioplasty of the mid right coronary     artery.  5. Angio-Seal right femoral artery.   DIAGNOSES:  1. Coronary atherosclerotic disease.  2. Normal left ventricular systolic function.  3. Unstable angina.  4. In-stent restenosis.   HISTORY:  [name] is a 76 year old white female with diabetes mellitus and  coronary disease who has previously undergone percutaneous intervention with  stent placement to the mid right coronary artery on February 02, 1999.  This was a TriStar stent as part of the POST-IT protocol.  She developed  severe substernal chest discomfort with radiation to the left arm this  weekend prompting admission to the hospital.  She was stabilized medically  and ruled out for acute myocardial infarction.  She presents now for further  cardiac assessment.   TECHNIQUE:  Informed consent was obtained.  The patient brought to the  catheterization lab.  A 6 French sheath was placed in the right femoral  artery using the modified Seldinger technique.  A 6 [place] JL-4 and JR-4  catheter was then used to engage the left and right coronary arteries and  selective angiography performed in various projections using manual  injection contrast.  A 6 French pigtail catheter was advanced in the left  ventricle and a left ventriculogram performed using power injection  contrast.   FINDINGS:   LEFT HEART  CATHETERIZATION:  1. Left main trunk:  Large caliber vessel and ostial taper of 20-30%.  2. LAD:  This is a medium caliber vessel that provides two trivial diagonal     branches in the distal segment.  The LAD has mild diffuse disease of 30-     40% in the proximal mid section.  3. Left circumflex artery:  This is a medium caliber vessel that provides     two marginal branches.  The left circumflex system has mild disease of     30% in the proximal mid section.  4. Ramus intermedius is a small caliber vessel that provides anterior     lateral wall.  The ramus branch has moderate disease of 30-40% in the     proximal segment.  5. Right coronary artery is dominant.  This is a large caliber vessel that     provides posterior descending artery and posterior ventricular branch in     the terminal segment.  The right coronary artery has previously placed     stent in the mid section with in-stent restenosis of 70%.  The distal     vessel has mild irregularities.   LEFT VENTRICULOGRAPHY:  1. Normal end-systolic and end-diastolic dimensions.  2. Overall left ventricular function is well preserved.  3. Ejection fraction greater than 55%.  4. No mitral regurgitation.  5. LV pressure is 130/15.  6. Aortic pressure is 130/60.  7. LVEDP equals 25.   With these findings, we elected to proceed with percutaneous intervention of  the mid right coronary artery.  The patient had been pretreated with  aspirin, heparin and integrilin. The ACT was greater than 200 seconds.  A 6  [place] JR-4 guide catheter was used to engage the right coronary artery and  0.014 inch support wire advanced into the distal RCA.  A 3.5 x 15-mm cutting  balloon was then introduced and multiple inflations performed at up to 14  atmospheres for 60 seconds. Repeat angiography then showed an excellent  result with significant improvement in vessel lumen with perhaps 10-20%  residual narrowing.  There was no vessel damage and  TIMI-3 flow to the RCA.  The patient did have ECG changes and chest discomfort reminiscent of her  pain on presentation.  These resolved at the end of the case.   Final angiography was performed showing no distal vessel damage.  The guide  catheter was then removed and the Angio-Seal closure device deployed at the  right femoral artery.  Adequate hemostasis was achieved.  The patient was  then transferred to the holding area in stable condition.   FINAL RESULT:  Successful percutaneous transluminal coronary angioplasty of  the mid right coronary artery with reduction of 70% in-stent restenosis to  10% using a 3.5 mm cutting balloon.                                               Veneda Melter, M.D.    NG/MEDQ  D:  08/22/2003  T:  08/23/2003  Job:  [id number]   cc:   Dr. Arsenio Loader

## 2010-10-05 NOTE — Assessment & Plan Note (Signed)
[name]                            CARDIOLOGY OFFICE NOTE   NAME:[name] [name], [name] [name]                     MRN:          [id number]  DATE:07/22/2006                            DOB:          [birth year]    REFERRING PHYSICIAN:  Pricilla Riffle, MD, [facility]   This is a post-[name] visit.  This is a 76 year old white female  patient who underwent cardiac catheterization for recurrent chest pain.  She underwent successful percutaneous cutting balloon angioplasty for in-  stent restenosis of the mid-RCA, with improvement from 80% to less than  20%.  She initially had TriStar stent placed in 2000 in the right  coronary artery by Dr. Juanda Chance, and cutting balloon angioplasty for in-  stent restenosis in 2005 by Dr. Chales Abrahams.   Since the patient has been home, she has not had any recurrent chest  pain.  Her main complaint is she has no energy and complains of complete  fatigue.  She was started on Toprol prior to admission to the [name]  and says she has no energy or appetite.  There is some confusion over  her medications.  She cannot afford the Crestor, and when I gave her a  prescription for lovastatin she says she is actually taking that.  I  asked her to bring her medications with her the next time she comes.  The patient also states that her blood pressure has been running a  little bit low.  Yesterday morning, she took it, and it was 84/36, and  last night it was 90/38.   CURRENT MEDICATIONS:  1. We know she is taking felodipine 5 mg daily.  2. Levothyroxine 175 mcg daily.  3. Aspirin 325 daily.  4. Toprol-XL 25 daily.  5. Lovastatin 10 mg daily.  6. Glucophage 1000 mg b.i.d.  7. Quinapril 10  mg daily.  8. Lasix 40 mg daily.  9. Novolin 70/30 b.i.d.  10.Glucotrol XL 10 mg daily.  11.Proventil 2 puffs q.4 h.  12.K-Dur 20 mEq b.i.d.  13.Isosorbide 30 mg a day.   PHYSICAL EXAMINATION:  GENERAL:  This is a pleasant 76 year old white  female in no  acute distress.  VITAL SIGNS:  Blood pressure 130/70, pulse 60, weight 251, which is down  9 pounds since her office visit 2 months ago.  NECK:  Without JVD, HJR, bruit, or thyroid enlargement.  LUNGS:  Clear anterior, posterior, and lateral.  HEART:  Regular rate and rhythm at 60 beats per minute, normal S1 and  S2.  No murmur, rub, bruit, thrill, or heave noted.  ABDOMEN:  Soft, without organomegaly, masses, lesions, or abnormal  tenderness.  EXTREMITIES:  Without cyanosis, clubbing, or edema.  She has good distal  pulses.   EKG:  Normal sinus rhythm, with nonspecific ST changes.  No acute  change.   IMPRESSION:  1. Coronary artery disease, status post cutting balloon angioplasty      for in-stent restenosis of the right coronary artery on June 04, 2006 by Dr. Charlies Constable.  2. Status post TriStar stent to the right coronary artery  in 2000.  3. Status post cutting balloon angioplasty for in-stent restenosis of      the right coronary artery in 2005.  4. Normal left ventricular function.  5. History of palpitations.  6. Morbid obesity.  7. Diabetes mellitus.  8. History of paroxysmal atrial tachycardia in March 2007 on Holter      monitor.  9. Dyslipidemia.  10.Gastroesophageal reflux disease.  11.Hypothyroidism.  12.Hypertension.  13.Depression.  14.Diabetes mellitus.   PLAN AT THIS TIME:  I have asked the patient to stop her Toprol to see  if that will improve her fatigue and her blood pressure.  We will check  a fasting lipid panel today, as she has been on lovastatin for  approximately 6 weeks, and she has not eaten today.  She will see Dr.  Tenny Craw back in one month and hopefully bring her medications with her at  that time.      Jacolyn Reedy, PA-C  Electronically Signed      Arturo Morton. Riley Kill, MD, [name] [name]  Electronically Signed   ML/MedQ  DD: 07/22/2006  DT: 07/22/2006  Job #: [id number]   cc:   Cheri Rous

## 2010-10-05 NOTE — H&P (Signed)
NAMEMarland Kitchen  EVERETT, RICCIARDELLI NO.:  [id number]   MEDICAL RECORD NO.:  [id number]          PATIENT TYPE:  INP   LOCATION:  1824                         FACILITY:  [facility]   PHYSICIAN:  Pearlean Brownie, M.D.DATE OF BIRTH:  02-26-1935   DATE OF ADMISSION:  12/16/2005  DATE OF DISCHARGE:                                HISTORY & PHYSICAL   CHIEF COMPLAINT:  Left back and abdominal pain x2 weeks.   HISTORY OF PRESENT ILLNESS:  Patient is a 76 year old Caucasian female with  a history of CAD, hypothyroidism, diabetes mellitus, hypertension, who  complains of two weeks of lower left back pain and left abdominal pain  radiating to the ribs.  At worst the pain is 10/10 and described as sharp.  The pain has gotten progressively worse.  There are no alleviating or  aggravating factors.  The patient has tried over-the-counter pain  medications with no relief.  She is also endorsing some fevers, sweats, and  chills.  Over the past several days, the patient has had some nausea and  vomiting.  The patient has also had decreased p.o. intake for the last  several days.  Patient denies any change in urinary habits or bowel  symptoms.  She denies diarrhea, melena, or hematochezia.  In the emergency  department, the pretibial edema received 4 mg of morphine and 4 mg of Zofran  with some relief.  At present, the patient is still having some pain, mostly  in her left back and in the left lower abdomen but has had some resolution.   PAST MEDICAL HISTORY:  1.  CAD, status post RCA stent placement in 2000, restenosis with PTCA      placement in 2005, EF 55% in 2005.  2.  Hypothyroidism.  3.  GERD.  4.  Depression.  5.  Diabetes mellitus.  6.  History of hemorrhoids.  7.  Hypertension.  8.  Hyperlipidemia.  9.  Obesity.  10. Status post cataract surgery, knee surgery, partial hysterectomy.  11. Recent history of bronchitis.   SOCIAL HISTORY:  [place]  She is a retired Psychiatric nurse.  She denies alcohol use, tobacco  use, and illicit drug use.   FAMILY HISTORY:  Patient's mother died of esophageal cancer.   MEDICATIONS:  Home meds include Actos 45 mg daily, bisoprolol/HCTZ 5/6.25 mg  daily, Crestor 40 mg daily, Lasix daily, glipizide 10 mg daily, isosorbide  mononitrate 30 mg p.r.n., Novolin 70/30 25 units in the morning and 15 units  in the evening, felodipine 5 mg daily, Proventil inhaler, Metformin 500 mg  b.i.d., Levothroid 150 mcg daily, Klor-Con 20 daily.   ALLERGIES:  No known drug allergies.   PHYSICAL EXAMINATION:  VITAL SIGNS: Temperature 97.1, pulse 60, respiratory  rate 22, O2 sats 96% on room air, blood pressure 175/87.  GENERAL:  Patient is an obese female lying in bed, appearing in mild pain  but in no apparent distress.  HEENT/NECK:  Patient's extraocular muscles are intact.  PERRL.  Moist mucous  membranes.  Dentures superiorly.  Neck is  supple with no LAD and no enlarged  thyroids.  LUNGS:  Clear to auscultation bilaterally.  CARDIOVASCULAR:  Patient has a regular rate and rhythm with no murmurs, rubs  or gallops.  No carotid bruits.  Good distal pulses.  ABDOMEN:  Patient is obese.  Abdomen is soft, nondistended.  Positive  normoactive bowel sounds.  Tenderness to palpation in the right upper  quadrant and left lower quadrant with no rebound or guarding.  Left CVA  tenderness.  NEUROLOGIC:  Patient is alert and oriented x4.  Cranial nerves II-XII are  intact.  She has decreased sensation bilaterally in her feet.  PSYCHIATRIC:  Patient is pleasant with a full affect.  EXTREMITIES:  No clubbing, cyanosis or edema.   LABORATORY DATA:  Hemoglobin 13.6, hematocrit 40.1, WBC 8 with ANC 5.4,  platelets 221.  BNP:  Patient has a sodium of 138, potassium 3.9, chloride  105, bicarb 26, BUN 10, creatinine 0.8, glucose 161, calcium 9.2.  Of note,  the potassium was hemolyzed on the labs.   Urinalysis showed nitrite  positive, leukocyte esterase positive, 21-50 WBCs per high-powered field  with many bacteria and rare epithelials.  Urine culture and blood culture  are pending.   RADIOLOGY:  CT scan of the abdomen without contrast shows no acute findings,  consistent with an abscess or kidney stones.   ASSESSMENT/PLAN:  This is a 76 year old female with a long medical history  complaining of two weeks of worse back and abdominal pain with nausea,  vomiting, and decreased p.o. intake as well as a dirty urinalysis.  This is  very concerning for acute pyelonephritis.  1.  Pyelonephritis:  We will begin Rocephin 1 gm IV q.24h.  We will add      urine Gram's stain to the urine in the lab.  We also await urine culture      and sensitivity.  Patient will be admitted to the hospital for this      reason.  2.  Diabetes mellitus:  Patient's present blood sugar is 161.  We will cut      her Novolin 70/30 in half.  We will continue her home Actos and      Metformin.  We will hold her glipizide.  We will also check an A1C.  3.  Coronary artery disease/hypertension/hyperlipidemia:  We will continue      her home meds.  We will also check an EKG.  4.  Hypothyroidism:  We will continue her Levothroid medication.  5.  Diet:  Patient will be on clears and advance as tolerated.  6.  Pain:  We will begin the patient on morphine 2 mg IV q.4h.  7.  Prophylaxis:  Patient will be begun on Protonix 40 mg daily for GI      prophylaxis.  Patient will be given TED hose/SCDs for ambulation for DVT      prophylaxis.  8.  Bowel regimen:  Patient will be begun on milk of magnesia.  9.  Possible bronchitis:  Patient will receive a chest x-ray.      Angeline Slim, M.D.    ______________________________  Pearlean Brownie, M.D.    AL/MEDQ  D:  12/16/2005  T:  12/16/2005  Job:  [id number]

## 2010-10-05 NOTE — Discharge Summary (Signed)
[name] [name], [name] [name]              ACCOUNT NO.:  [id number]   MEDICAL RECORD NO.:  [id number]          PATIENT TYPE:  INP   LOCATION:  5006                         FACILITY:  [facility]   PHYSICIAN:  Johney Maine, M.D.   DATE OF BIRTH:  09/09/1934   DATE OF ADMISSION:  12/16/2005  DATE OF DISCHARGE:                                 DISCHARGE SUMMARY   ADMISSION DIAGNOSES:  1.  Pyelonephritis.  2.  Diabetes mellitus.  3.  Coronary artery disease.  4.  Hypertension.  5.  Hypothyroid.   REASON FOR ADMISSION:  This is a 76 year old Caucasian female with history  coronary artery disease, hypothyroid, diabetes and hypertension who  complains of 2 weeks of lower left back pain and left abdominal pain that  radiates to the ribs.  She described the pain, on admission, as a 10/10 and  it was sharp.  It has gotten worse.  Nothing makes it worse or better.  She  denies fevers, sweat or chills.  She has had some nausea and vomiting along  with this.  On the date of admission, a urinalysis was done, which showed a  urinary tract infection with E. coli.   The patient was admitted to treat what was thought to be pyelonephritis.  The patient was started on Rocephin 1 gram q.24 hours.  The patient was  continued on her diabetes medications, although lower than her home dose.  An EKG was performed, which showed bradycardia; otherwise, normal.  The  patient was given IV fluids and kept on a clear liquid diet initially.   HOSPITAL COURSE:  The patient improved during her hospital stay.  Her nausea  and vomiting subsided with her IV fluids and liquid diet.  However, she  continued to have this left-sided back pain also a little bit on the  midline.  Questionable CVA tenderness.  We continued the antibiotics.  Gave  her medicines, including morphine for pain.   Pyelonephritis.  Patient was continued on antibiotics throughout her  hospital stay, although her symptoms began to resemble something other  than  pyelonephritis.  Upon discharge, she was switched to p.o. amoxicillin to  treat a urinary tract infection.  We decided that pyelonephritis was not one  of her diagnoses.   Diabetes mellitus.  This patient continued to have severely elevated blood  sugars in the 200s and 300s throughout her course of stay.  It was noted  that she was not on her home regimen of Novolin 70/30 twenty-five units in  the morning, 15 units in the afternoon.  We thus increased her medications  to her home doses.  Her sugars decreased slightly.  We will continue this  regimen at home.  She will follow up with her primary care [name] for her  diabetes control.  On admission, we initially held her Glipizide, but was  added later in her hospital course.   Hypertension.  This patient's blood pressure was well controlled with her  home meds of amlodipine.  No problems with that.   Nausea and vomiting resolved early on in the course.  Patient does not  have  any nausea at this time.   Anterolisthesis.  This was found on lumbosacral spine x-ray at L4 and L5,  grade I.  Patient has degenerative changes of this area and will follow up  with her primary care [name] if any actions need to be taken.  She will be  sent home on pain medications to help this.   Constipation.  The patient did not have a bowel movement during this  hospital stay; however, she was passing gas.  We began her on MiraLax to  help her with this and she will also be followed by her primary care  Vitoria Conyer at home.   PROCEDURES:  This patient had an EKG, chest x-ray, lumbosacral spine x-ray,  which showed anterolisthesis.  Her chest x-ray showed mild cardiac  enlargement.  CT of the pelvis was done, which showed no acute findings.  A  CT of the abdomen was done, which showed no acute findings.   DISPOSITION:  The patient will be discharged home with her home medications,  in addition to an antibiotic to treat her urinary tract infection.   Patient  discharged in improved condition to her family.  She will follow up with her  primary care [name], Dr. __________ in [place].  Her next appointment is  Monday, December 23, 2005, and this was a previously scheduled appointment.   FOLLOWUP ISSUES:  She needs to follow up with her anterolisthesis with her  primary care [name] and also discuss diabetes management.  Her blood  sugars are not well controlled at this time.   DISCHARGE CONDITION:  Stable with improved condition, able to walk on her  own.   DISCHARGE MEDICATIONS:  1.  Actos 45 mg take 1 p.o. daily.  2.  Bisoprolol/HCTZ take 1 p.o. daily.  3.  Crestor 40 mg take 1 p.o. daily.  4.  Novolin 70/30 take 25 units subcu in the morning, 15 units subcu in the      evening.  5.  Glipizide 10 mg one p.o. daily.  6.  Isosorbide mononitrate take 1 p.o. daily.  7.  Amlodipine 5 mg take 1 p.o. daily.  8.  Lasix 20 mg take 1 p.o. daily.  9.  Levothyroxine 150 mcg one p.o. daily.  10. Metformin 500 mg take 1 pill p.o. b.i.d.  11. Percocet 5/325 take 1 pill p.o. q.4 to 6 hours p.r.n. pain.  12. Amoxicillin 500 mg take 1 pill p.o. t.i.d.  13. Klor-Con 20 mEq one pill p.o. daily.  14. Albuterol MDI inhaler 1 to 2 puffs p.o. q.4 to 6 hours p.r.n.      wheezing/shortness of breath.   DISCHARGE DIAGNOSES:  1.  Urinary tract infection.  2.  Anterolisthesis.  3.  Hypertension.  4.  Diabetes.  5.  Constipation.  6.  Coronary artery disease.   DISCHARGE LABS:  This patient had normal basic metabolic panel, except for  an elevated glucose of 223, creatinine was normal at 0.9.  Labs remained  normal throughout her stay, except for the elevated glucose.  Hemoglobin A1c  on admission was 8.2, which is elevated and follow up with her primary care  physician for this.  Urinary tract infection, which showed positive nitrates  and large amount of esterase.  CBC:  White count was improving.  Today, December 19, 2005, white count 5.2.   Previous white count 7.8.   This patient is discharged to follow up with primary care [name], being  treated with amoxicillin for urinary  tract infection, among many other  problems including diabetes and hypertension.           ______________________________  Johney Maine, M.D.     JT/MEDQ  D:  12/19/2005  T:  12/19/2005  Job:  [id number]   cc:   Dr. Maida Sale, M.D.

## 2010-10-05 NOTE — Assessment & Plan Note (Signed)
[facility]                            CARDIOLOGY OFFICE NOTE   NAME:[name] [name], [name] [name]                     MRN:          [id number]  DATE:05/28/2006                            DOB:          [birth year]    CARDIOLOGIST:  Dr. Pricilla Riffle.   PRIMARY CARE PHYSICIAN:  Dr. Cheri Rous.   HISTORY OF PRESENT ILLNESS:  [name] [name] is a 76 year old female patient  with a history of coronary artery disease status post stenting of the  RCA with angioplasty in April 2005 secondary to InStent restenosis whose  had a history of palpitations in the past. She was evaluated with a  Holter monitor back in March of this year that showed brief runs of  paroxysmal atrial tachycardia. She recently saw Dr. Egbert Garibaldi back in  followup and she had another episode of palpitations. This episode was  somewhat prolonged lasting about 30 minutes to an hour. She felt like it  was rapid. She had no associated symptoms. She denied any shortness of  breath, near syncope or light-headedness. She does have occasional  dyspnea on exertion. She has had this for several years. It may be  getting a little bit worse. She has also noted some lower extremity  edema. This is somewhat chronic but is worse today. Her left lower  extremity is worse than her right. She denies any chest pain. She denies  any true orthopnea or paroxysmal nocturnal dyspnea.   CURRENT MEDICATIONS:  1. Felodipine 5 mg daily.  2. Mobic 15 mg daily.  3. Levothyroxine 175 mg daily.  4. Aspirin 81 mg daily.  5. Glucophage 1 gram b.i.d.  6. Quinapril 10 mg daily.  7. Lasix 40 mg daily.  8. Novolin 70/30 b.i.d.  9. Crestor 40 mg daily - she is currently not taking secondary to      cough.  10.Glucotrol XL 10 mg daily.  11.Proventil.  12.K-Dur 20 mEq twice daily.  13.Isosorbide 30 mg daily.  14.Atrovent nasal spray 3 times a day.  15.Hydrocodone p.r.n.   ALLERGIES:  No known drug allergies.   SOCIAL HISTORY:   She is married, lives in [place]. She does not smoke.   REVIEW OF SYSTEMS:  Please see HPI. She has occasional hiccups. She  denies any excessive caffeine use. Denies any over-the-counter cold  medicine use. She had a bout of bronchitis early last year. The rest of  the review of systems are negative.   PHYSICAL EXAMINATION:  GENERAL:  She is a well-nourished, well-developed  female.  VITAL SIGNS:  Blood pressure 158/74, pulse 66, weight 260 pounds.  HEENT:  Unremarkable.  NECK:  Without JVD.  CARDIAC:  S1, S2, regular rate and rhythm without murmur.  LUNGS:  Clear to auscultation bilaterally without wheeze, rhonchi or  rales.  ABDOMEN:  Soft, nontender. Normal active bowel sounds, no organomegaly.  EXTREMITIES:  With 1+ edema bilaterally, the left being greater than the  right.  SKIN:  Warm and dry.  NEUROLOGIC:  She is alert and oriented x3. Cranial nerves II-XII grossly  intact. Calves are soft nontender. No palpable cords.  Electrocardiogram  reveals sinus rhythm with a heart rate of 66, normal  axis. Slight T wave inversions at 1 and AVL. No significant change since  previous tracing.   IMPRESSION:  1. Palpitations.  2. Coronary artery disease status post intervention to the RCA with      angioplasty in 2005 secondary to InStent restenosis.      a.     Catheterization at that time revealing left main 20%, left       anterior descending 30-40%, proximal mid circumflex with       __________  30%, ramus intermedius 30%.  3.   Dictation ended at this point.      Tereso Newcomer, PA-C       Luis Abed, MD, [facility]  Electronically Signed   SW/MedQ  DD: 05/28/2006  DT: 05/28/2006  Job #: [id number]   cc:   Cheri Rous

## 2010-10-05 NOTE — Assessment & Plan Note (Signed)
[facility]                            CARDIOLOGY OFFICE NOTE   NAME:[name], [name] BRUNETTE                     MRN:          [id number]  DATE:05/28/2006                            DOB:          May 26, 1934    CARDIOLOGIST:  Dietrich Pates, M.D.   PRIMARY CARE PHYSICIAN:  Dr. Cheri Rous   HISTORY OF PRESENT ILLNESS:  [name] [name] is a 76 year old female patient  who is referred back to [place] today by Dr. Egbert Garibaldi for history of  palpitations.  The patient had previously been evaluated for  palpitations back in March 2008.  She had Holter monitor that revealed  brief runs of paroxysmal atrial tachycardia.  She was treated medically.  She recently had an episode of tachypalpitations and saw Dr. Egbert Garibaldi  and he referred her back here.  She notes exertional shortness of breath  and has had this for quite some time.  It seems to be a little bit worse  recently.  She denies any true orthopnea, paroxysmal nocturnal dyspnea.  She has lower extremity edema that is chronic, left greater than the  right.  She thinks it is a little bit worse today than it usually is.  Initially, the patient denied any chest pain.  We had completed our  interview and she was actually on the way out the door when she told us  that she has had recent exertional chest heaviness and left arm pain  that resolves with rest.  She has slight diaphoresis with this.  She  denies any syncope or near-syncope.  She has noted the symptoms for the  last 2-3 months.   CURRENT MEDICATIONS:  1. Felodipine 5 mg daily.  2. Mobic 50 mg daily.  3. Levothyroxine 175 mcg daily.  4. Aspirin 81 mg daily.  5. Glucophage 1 g b.i.d.  6. Quinapril 10 mg daily.  7. Lasix 40 mg daily.  8. Novolin 70/30 b.i.d.  9. Crestor 40 mg daily - she is currently not taking this secondary to      cost.  10.Glucotrol XL 10 mg daily.  11.Proventil inhaler.  12.K-Dur 20 mEq b.i.d.  13.Isosorbide 30 mg daily.  14.Atrovent  nasal spray.  15.Hydrocodone p.r.n.   ALLERGIES:  No known drug allergies.   SOCIAL HISTORY:  She denies any tobacco abuse.   REVIEW OF SYSTEMS:  Please see HPI.  Denies any fever, chills, cough,  melena, hematochezia, hematuria, dysuria.  The rest of review of systems  are negative.   PHYSICAL EXAMINATION:  GENERAL:  She is a well-nourished, well-developed  female in no acute distress.  VITAL SIGNS:  Blood pressure 150/74, weight 260 pounds, pulse 66.  HEENT:  Head:  Normocephalic, atraumatic.  Eyes:  PERRLA, EOMI, sclerae  clear.  NECK:  Without JVD, lymphadenopathy.  ENDOCRINE:  No thyromegaly.  CARDIAC:  S1, S2.  Regular rate and rhythm without murmurs, clicks, rubs  or gallops.  LUNGS:  Clear to auscultation bilaterally without wheezing, rhonchi or  rales.  ABDOMEN:  Soft, nontender, with normal active bowel sounds, no  organomegaly.  EXTREMITIES:  Show 1+ edema bilaterally with  left being greater than  right.  Calves are soft and nontender without palpable cords.  SKIN:  Warm and dry.  NEUROLOGIC:  Alert and oriented x3.  Cranial nerves II-XII grossly  intact.   Electrocardiogram reveals sinus rhythm with a heart rate of 66, normal  axis, T-wave inversions in I and aVL with no significant change since  previous tracings.   IMPRESSION:  1. Exertional chest pain concerning for exertional angina.  2. Palpitations.  3. Coronary artery disease status post intervention to the right      coronary artery with angioplasty to the right coronary artery in      April 2005 secondary to in-stent restenosis.  Catheterization at      that time revealing residual left main 20%, left anterior      descending artery 30-40% proximal/mid, left circumflex with two      obtuse marginals 30%, ramus intermedius 30% proximal.  Her right      coronary artery had 70% mid in-stent restenosis that was      angioplastied.  4. History of good left ventricular function.  5. Dyspnea on exertion.   6. Palpitations.  Intermittent paroxysmal atrial tachycardia in the      past on Holter monitor in March 2007.  7. Dyslipidemia.  8. History of gastroesophageal reflux disease.  9. Treated hypothyroidism.  Levothyroxine recently increased.  10.History of depression.  11.Diabetes mellitus.  12.Morbid obesity.   PLAN:  The patient presents to the office today for palpitations.  Initially I suggested adding Toprol-XL 25 mg a day and setting her up  for a stress test as she has had more shortness of breath recently.  As  noted above, upon leaving the office she noted symptoms that are  concerning for exertional angina.  Therefore, we cancelled the stress  test and have recommended proceeding with cardiac catheterization in the  outpatient lab.  I discussed this with Dr. Myrtis Ser, who agreed.  I have  given her a prescription for sublingual nitroglycerin.  She will  be set up for outpatient catheterization in the next few days.  She will  go ahead and start Toprol-XL today as an anti-anginal and increase her  aspirin to 325 mg a day.      Tereso Newcomer, PA-C  Electronically Signed      Luis Abed, MD, [facility]  Electronically Signed   SW/MedQ  DD: 05/28/2006  DT: 05/28/2006  Job #: [id number]   cc:   Cheri Rous

## 2010-10-05 NOTE — Discharge Summary (Signed)
NAMEMarland Kitchen  [name] [name], [name] [name]              ACCOUNT NO.:  [id number]   MEDICAL RECORD NO.:  [id number]          PATIENT TYPE:  OIB   LOCATION:  6525                         FACILITY:  [facility]   PHYSICIAN:  Noralyn Pick. Eden Emms, MD, FACCDATE OF BIRTH:  07-13-1934   DATE OF ADMISSION:  06/04/2006  DATE OF DISCHARGE:  06/05/2006                               DISCHARGE SUMMARY   DISCHARGING PHYSICIAN:  Dr. Maurine Cane.   PRIMARY CARDIOLOGIST:  Dr. Dietrich Pates.   PRIMARY CARE PHYSICIAN:  Dr. Cheri Rous.   DISCHARGING DIAGNOSIS:  Chest pain, status post cardiac  catheterization/percutaneous intervention on 06/04/2006.  The patient  underwent a successful percutaneous transluminal coronary angioplasty  (cutting balloon) of the in-stent restenotic lesion in the mid right  coronary with improvement with stenting from 80 to less than 20.   PAST MEDICAL HISTORY:  1. Coronary artery disease, status post intervention to the right      coronary artery with angioplasty in 2005, secondary to in-stent      restenosis.  2. Good left ventricular function.  3. Palpitations.  4. Morbid obesity.  5. Status post Holter monitor, March 2007, showing paroxysmal atrial      tachycardia.  6. Dyslipidemia, with the patient discontinuing her Crestor secondary      to financial issues.  7. GERD.  8. Hypothyroidism.  9. Hypertension.  10.Depression.  11.Diabetes.  12.Noncompliance to medication secondary to socioeconomic issues.   HOSPITAL COURSE:  [name] [name] is a 76 year old female seen on January 9 by  Tereso Newcomer in the office setting secondary to exertional chest  discomfort.  The patient was started on Toprol XL 25 mg daily.  The  patient was also given prescription for nitroglycerin, scheduled for  cardiac catheterization.  The patient underwent cardiac catheterization  on the 16th with results as stated above.  She tolerated procedure  without complications.  The patient will not be started on Plavix  secondary to socioeconomic issues, and the patient states she will not  fill prescription because she cannot afford it.   At at time of discharge, Dr. Eden Emms in to see the patient.  Vital Signs:  Stable.  Cath site stable.  She is being discharged home to follow up  with Dr. Tenny Craw after cardiac rehabilitation saw her.  The patient has  been given the post cardiac catheterization discharge instructions.  She  is to follow up with Dr. Tenny Craw in 2 weeks.   MEDICATIONS AT DISCHARGE:  The patient instructed to resume her  Glucophage on the 19th.  Continue aspirin 325, metoprolol 25,  bisoprolol/HCTZ 5.625 daily, insulin as previously ordered, Plendil 5  mg, Glucotrol XL 10 mg daily, levothyroxine 175 mcg daily, furosemide 40  mg b.i.d., potassium 20 mEq b.i.d., quinapril 10 mg daily.  Note:  The  patient did not list quinapril as one of her home medications, and she  has not been receiving here, but it was not a discontinued medication  per office notes.  The patient is unable to tell me the names of her  medications or which ones she is on.  I am going  to have her continue  her quinapril as previously taken.  Nebulizers as previously instructed,  isosorbide 30 mg daily, and I have written her a prescription for  simvastatin 80 mg daily in place of her Crestor, as the patient states  she discontinued herself secondary to financial issues.   Duration of discharge encounter 30 minutes.      Dorian Pod, ACNP      Noralyn Pick. Eden Emms, MD, [facility]  Electronically Signed    MB/MEDQ  D:  06/05/2006  T:  06/05/2006  Job:  [id number]   cc:   Cheri Rous

## 2010-10-05 NOTE — Cardiovascular Report (Signed)
NAMEMarland Kitchen  [name] [name], [name] [name] NO.:  [id number]   MEDICAL RECORD NO.:  [id number]          PATIENT TYPE:  OIB   LOCATION:  1966                         FACILITY:  [facility]   PHYSICIAN:  Bevelyn Buckles. Bensimhon, MDDATE OF BIRTH:  [birth year]   DATE OF PROCEDURE:  05/29/2006  DATE OF DISCHARGE:                            CARDIAC CATHETERIZATION   PRIMARY CARE PHYSICIAN:  Cheri Rous, M.D.   CARDIOLOGIST:  Pricilla Riffle, M.D.   PATIENT IDENTIFICATION:  [name] [name] is a 71-year woman with history of  morbid obesity, diabetes, hypertension, and hyperlipidemia.  She has a  history of coronary artery disease and previously underwent stenting of  her right coronary artery which was complicated by in-stent restenosis,  and this was re-angioplastied by Dr. Juanda Chance in April 2005.  Recently,  she was seen in clinic for some palpitations, and during that visit  admitted to some exertional chest pain.  She was thus referred for  catheterization in the outpatient catheterization lab.   PROCEDURES PERFORMED:  1. Selective coronary angiography.  2. Left heart catheterization.  3. Left ventriculogram.   DESCRIPTION OF THE PROCEDURE:  The risks and benefits of catheterization  were explained.  Consent was signed and placed on the chart.  A 4-French  arterial sheath was placed in the right femoral artery using a modified  Seldinger technique.  Standard catheters included a JL-4, 3DRC,  and  angled pigtail.  All catheter exchanges were made over a wire.  There  were no apparent complications.   Central aortic pressure 121/58, with a mean of 83.  LV pressure 137/0,  with EDP of 9.  There was no aortic stenosis.   Left main had a mid 50% lesion.   LAD was a long vessel, coursing to the apex.  There was a 30% lesion  proximally and a 50-60% lesion in the midsection.   Left circumflex was a moderate-sized system and gave off a moderate size  ramus, a small OM1, and a large OM2. There was a 20%  ostial lesion in  the left circumflex proper.   Right coronary artery was a large dominant vessel that had a dual PDA  system and two small PLs. There was a 70%-75% tubular lesion inside of  the stent.  There was a distal 40% lesion prior to the takeoff of the  first PDA.   Left ventriculogram showed an EF of 60%.  There were no wall motion  abnormalities or mitral regurgitation.   ASSESSMENT:  1. Significant one-vessel coronary artery disease, with 70%-75% in-      stent restenosis of the RCA.  2. Nonobstructive coronary artery disease elsewhere, including a 50%      left main lesion.  3. Normal left ventricular function.   PLAN/DISCUSSION:  I will review the films with Dr. Juanda Chance regarding  possible percutaneous intervention on the RCA.      Bevelyn Buckles. Bensimhon, MD  Electronically Signed     DRB/MEDQ  D:  05/29/2006  T:  05/29/2006  Job:  [id number]   cc:   Myriam Jacobson, MD, [facility]

## 2010-12-17 ENCOUNTER — Encounter: Payer: Self-pay | Admitting: Cardiovascular Disease

## 2010-12-25 ENCOUNTER — Ambulatory Visit: Payer: Self-pay | Admitting: Neurology

## 2011-01-23 ENCOUNTER — Encounter: Payer: Self-pay | Admitting: Cardiovascular Disease

## 2011-01-25 ENCOUNTER — Encounter: Payer: Self-pay | Admitting: Cardiovascular Disease

## 2011-01-25 ENCOUNTER — Ambulatory Visit (INDEPENDENT_AMBULATORY_CARE_PROVIDER_SITE_OTHER): Payer: Medicare PPO | Admitting: Cardiovascular Disease

## 2011-01-25 VITALS — BP 135/60 | HR 66 | Ht 64.0 in | Wt 222.0 lb

## 2011-01-25 DIAGNOSIS — E785 Hyperlipidemia, unspecified: Secondary | ICD-10-CM

## 2011-01-25 DIAGNOSIS — I2510 Atherosclerotic heart disease of native coronary artery without angina pectoris: Secondary | ICD-10-CM

## 2011-01-25 DIAGNOSIS — R0602 Shortness of breath: Secondary | ICD-10-CM

## 2011-01-25 DIAGNOSIS — R002 Palpitations: Secondary | ICD-10-CM

## 2011-01-25 MED ORDER — METOPROLOL TARTRATE 25 MG PO TABS: 25.0000 mg | ORAL_TABLET | Freq: Two times a day (BID) | ORAL | Status: DC | PRN

## 2011-01-25 NOTE — Patient Instructions (Signed)
You are doing well. Take a 1/2 pill of metoprolol for palpitations twice a day as needed. Please call us if you have new issues that need to be addressed before your next appt.  We will call you for a follow up Appt. In 12 months

## 2011-01-26 ENCOUNTER — Encounter: Payer: Self-pay | Admitting: Cardiovascular Disease

## 2011-01-26 NOTE — Assessment & Plan Note (Signed)
Currently with no symptoms of angina. No further workup at this time. Continue current medication regimen. 

## 2011-01-26 NOTE — Assessment & Plan Note (Signed)
We do not have a recent cholesterol panel. She is trying to set up a new primary care in the area as she recently moved from [place]. We will await on her labs she may have a complete panel when she has an appointment with a new primary care physician.

## 2011-01-26 NOTE — Progress Notes (Signed)
Patient ID: [name] [name], female    DOB: [birth year], 76 y.o.   MRN: [id number]  HPI Comments: Ms. Leason is a 76 year old woman with a history of CAD, status post PTCA/cutting balloon angioplasty for in-stent restenosis of the RCA, adenosine Myoview on 3/10 that showed normal perfusion, Who presents for routine followup.  She reports that in general she has been doing well. She did receive prednisone for arthralgias and this caused her sugars to go very high. She continues to have mild chronic shortness of breath with exertion. Her weight has slowly been decreasing from 260 pounds 2 years ago, Now down to 220 pounds. She denies any significant chest pain, lightheadedness or dizziness.  EKG shows normal sinus rhythm with rate 66 beats per minute, left axis deviation, unable to exclude old anterior infarct   Outpatient Encounter Prescriptions as of 01/25/2011  Medication Sig Dispense Refill  . amLODipine (NORVASC) 5 MG tablet Take 5 mg by mouth daily.        Marland Kitchen aspirin 81 MG tablet Take 162 mg by mouth daily.        . cephALEXin (KEFLEX) 250 MG capsule Take 1 tablet by mouth Daily.        . furosemide (LASIX) 40 MG tablet Take 40 mg by mouth 2 (two) times daily as needed.       Marland Kitchen HUMULIN N 100 UNIT/ML injection as directed.      Marland Kitchen HUMULIN R 100 UNIT/ML injection as directed.      . isosorbide mononitrate (ISMO,MONOKET) 10 MG tablet Take 5 mg by mouth 2 (two) times daily.        Marland Kitchen levothyroxine (SYNTHROID, LEVOTHROID) 200 MCG tablet Take 200 mcg by mouth daily.        Marland Kitchen lisinopril (PRINIVIL,ZESTRIL) 10 MG tablet Take 10 mg by mouth daily.        . meclizine (ANTIVERT) 25 MG tablet Take 25 mg by mouth daily.        . metFORMIN (GLUCOPHAGE) 500 MG tablet Take 1,000 mg by mouth 2 (two) times daily.        . nabumetone (RELAFEN) 750 MG tablet Take 750 mg by mouth daily.        . nitroGLYCERIN (NITROSTAT) 0.4 MG SL tablet Place 0.4 mg under the tongue every 5 (five) minutes as needed.        .  potassium chloride SA (K-DUR,KLOR-CON) 20 MEQ tablet Take 20 mEq by mouth 2 (two) times daily.        . simvastatin (ZOCOR) 80 MG tablet Take 80 mg by mouth daily.        . metoprolol tartrate (LOPRESSOR) 25 MG tablet Take 1 tablet (25 mg total) by mouth 2 (two) times daily as needed.  60 tablet  11     Review of Systems  Constitutional: Negative.   HENT: Negative.   Eyes: Negative.   Respiratory: Positive for shortness of breath.   Cardiovascular: Negative.   Gastrointestinal: Negative.   Musculoskeletal: Positive for back pain and arthralgias.  Skin: Negative.   Neurological: Negative.   Hematological: Negative.   Psychiatric/Behavioral: Negative.   All other systems reviewed and are negative.    BP 135/60  Pulse 66  Ht 5\' 4"  (1.626 m)  Wt 222 lb (100.699 kg)  BMI 38.11 kg/m2   Physical Exam  Nursing note and vitals reviewed. Constitutional: She is oriented to person, place, and time. She appears well-developed and well-nourished.       obese  HENT:  Head: Normocephalic.  Nose: Nose normal.  Mouth/Throat: Oropharynx is clear and moist.  Eyes: Conjunctivae are normal. Pupils are equal, round, and reactive to light.  Neck: Normal range of motion. Neck supple. No JVD present.  Cardiovascular: Normal rate, regular rhythm, S1 normal, S2 normal, normal heart sounds and intact distal pulses.  Exam reveals no gallop and no friction rub.   No murmur heard. Pulmonary/Chest: Effort normal and breath sounds normal. No respiratory distress. She has no wheezes. She has no rales. She exhibits no tenderness.  Abdominal: Soft. Bowel sounds are normal. She exhibits no distension. There is no tenderness.  Musculoskeletal: Normal range of motion. She exhibits no edema and no tenderness.  Lymphadenopathy:    She has no cervical adenopathy.  Neurological: She is alert and oriented to person, place, and time. Coordination normal.  Skin: Skin is warm and dry. No rash noted. No erythema.    Psychiatric: She has a normal mood and affect. Her behavior is normal. Judgment and thought content normal.         Assessment and Plan

## 2011-01-26 NOTE — Assessment & Plan Note (Signed)
Chronic mild shortness of breath. I suspect this is secondary to obesity and deconditioning.

## 2011-01-31 ENCOUNTER — Encounter: Payer: Self-pay | Admitting: Cardiovascular Disease

## 2011-02-21 ENCOUNTER — Ambulatory Visit (INDEPENDENT_AMBULATORY_CARE_PROVIDER_SITE_OTHER): Payer: Medicare PPO | Admitting: Internal Medicine

## 2011-02-21 ENCOUNTER — Encounter: Payer: Self-pay | Admitting: Internal Medicine

## 2011-02-21 VITALS — BP 136/74 | HR 80 | Temp 98.9°F | Resp 18 | Ht 60.0 in | Wt 182.0 lb

## 2011-02-21 DIAGNOSIS — M549 Dorsalgia, unspecified: Secondary | ICD-10-CM

## 2011-02-21 DIAGNOSIS — E119 Type 2 diabetes mellitus without complications: Secondary | ICD-10-CM

## 2011-02-21 DIAGNOSIS — Z0000 Encounter for general adult medical examination without abnormal findings: Secondary | ICD-10-CM

## 2011-02-21 DIAGNOSIS — Z23 Encounter for immunization: Secondary | ICD-10-CM

## 2011-02-21 DIAGNOSIS — N390 Urinary tract infection, site not specified: Secondary | ICD-10-CM

## 2011-02-21 DIAGNOSIS — G8929 Other chronic pain: Secondary | ICD-10-CM

## 2011-02-21 DIAGNOSIS — F329 Major depressive disorder, single episode, unspecified: Secondary | ICD-10-CM

## 2011-02-21 DIAGNOSIS — E785 Hyperlipidemia, unspecified: Secondary | ICD-10-CM

## 2011-02-21 DIAGNOSIS — E032 Hypothyroidism due to medicaments and other exogenous substances: Secondary | ICD-10-CM

## 2011-02-21 DIAGNOSIS — F3289 Other specified depressive episodes: Secondary | ICD-10-CM

## 2011-02-21 LAB — MICROALBUMIN / CREATININE URINE RATIO
Creatinine,U: 50 mg/dL
Microalb Creat Ratio: 23 mg/g (ref 0.0–30.0)
Microalb, Ur: 11.5 mg/dL — ABNORMAL HIGH (ref 0.0–1.9)

## 2011-02-21 LAB — POCT URINALYSIS DIPSTICK
Bilirubin, UA: NEGATIVE
Protein, UA: NEGATIVE
pH, UA: 6

## 2011-02-21 LAB — COMPREHENSIVE METABOLIC PANEL
ALT: 15 U/L (ref 0–35)
AST: 17 U/L (ref 0–37)
Albumin: 4 g/dL (ref 3.5–5.2)
Calcium: 9.3 mg/dL (ref 8.4–10.5)
Chloride: 104 mEq/L (ref 96–112)
GFR: 73.5 mL/min (ref >60.00)
Potassium: 4.2 mEq/L (ref 3.5–5.1)
Total Protein: 7.2 g/dL (ref 6.0–8.3)

## 2011-02-21 LAB — TSH: TSH: 0.24 u[IU]/mL — ABNORMAL LOW (ref 0.35–5.50)

## 2011-02-21 NOTE — Patient Instructions (Signed)
Bring record of blood sugar to next appointment in 1 month.

## 2011-02-21 NOTE — Progress Notes (Signed)
Addended by: Jobie Quaker on: 02/21/2011 05:26 PM   Modules accepted: Orders

## 2011-02-21 NOTE — Progress Notes (Signed)
Subjective:    Patient ID: [name] [name], female    DOB: [birth year], 76 y.o.   MRN: [id number]  HPI [name] [name] is a 75 year old female with a history of diabetes, hyperlipidemia, hypertension, coronary artery disease, and hypothyroidism who presents to establish care. She reports that her primary concern is chronic ongoing back pain. She was told in the past that she has spinal stenosis and that no surgical interventions would be helpful. She reports that she is unable to afford evaluation by orthopedics or pain clinic again at this time because of financial constraints. She is currently taking hydrocodone with minimal improvement in her symptoms. She denies any incontinence of bowel or bladder. Her ambulation and function are limited by severe back pain.  In regards to her diabetes, she reports that her blood sugars are typically elevated above 200. She notes that her last hemoglobin A1c was above 11%. She reports that she has tried other forms of insulin including Lantus in the past with no improvement. She reports compliance with her regular insulin but is not always compliant with her NPH insulin because of cost. She reports that after insurance this typically cost her about $25 per month. She denies any recent low blood sugars.  She is tearful today describing isolation from her family. She currently lives alone. She reports that her children do not visit her. Her husband died 3 years ago after suffering from congestive heart failure. She reports that a nurse who cared for him is her main friend and provided some financial support for her such as by paying for groceries. She reports that she can often not afford groceries. She has tried using [employer] Whe[employer] in the past but found his food to be unacceptable. She reports a recent weight loss of over 20 pounds in the setting of her limited access to food.  Outpatient Encounter Prescriptions as of 02/21/2011  Medication Sig Dispense Refill  .  amLODipine (NORVASC) 5 MG tablet Take 5 mg by mouth daily.        Marland Kitchen aspirin 81 MG tablet Take 162 mg by mouth daily.        . furosemide (LASIX) 40 MG tablet Take 40 mg by mouth 2 (two) times daily as needed.       Marland Kitchen HUMULIN N 100 UNIT/ML injection as directed.      Marland Kitchen HUMULIN R 100 UNIT/ML injection as directed.      Marland Kitchen HYDROcodone-acetaminophen (VICODIN) 5-500 MG per tablet Take 1 tablet by mouth as needed.        . isosorbide mononitrate (ISMO,MONOKET) 10 MG tablet Take 5 mg by mouth 2 (two) times daily.        Marland Kitchen levothyroxine (SYNTHROID, LEVOTHROID) 200 MCG tablet Take 200 mcg by mouth daily.        Marland Kitchen lisinopril (PRINIVIL,ZESTRIL) 10 MG tablet Take 10 mg by mouth daily.        . meclizine (ANTIVERT) 25 MG tablet Take 25 mg by mouth daily.        . metFORMIN (GLUCOPHAGE) 500 MG tablet Take 1,000 mg by mouth 2 (two) times daily.        . metoprolol tartrate (LOPRESSOR) 25 MG tablet Take 1 tablet (25 mg total) by mouth 2 (two) times daily as needed.  60 tablet  11  . nabumetone (RELAFEN) 750 MG tablet Take 750 mg by mouth daily.        . nitroGLYCERIN (NITROSTAT) 0.4 MG SL tablet Place 0.4 mg under the tongue  every 5 (five) minutes as needed.        . potassium chloride SA (K-DUR,KLOR-CON) 20 MEQ tablet Take 20 mEq by mouth 2 (two) times daily.        . simvastatin (ZOCOR) 80 MG tablet Take 80 mg by mouth daily.        . cephALEXin (KEFLEX) 250 MG capsule Take 1 tablet by mouth Daily.          Review of Systems  Constitutional: Negative for fever, chills, appetite change, fatigue and unexpected weight change.  HENT: Negative for ear pain, congestion, sore throat, trouble swallowing, neck pain, voice change and sinus pressure.   Eyes: Negative for visual disturbance.  Respiratory: Negative for cough, shortness of breath, wheezing and stridor.   Cardiovascular: Negative for chest pain, palpitations and leg swelling.  Gastrointestinal: Negative for nausea, vomiting, abdominal pain, diarrhea,  constipation, blood in stool, abdominal distention and anal bleeding.  Genitourinary: Positive for urgency. Negative for dysuria and flank pain.  Musculoskeletal: Positive for back pain. Negative for myalgias, arthralgias and gait problem.  Skin: Negative for color change and rash.  Neurological: Negative for dizziness and headaches.  Hematological: Negative for adenopathy. Does not bruise/bleed easily.  Psychiatric/Behavioral: Positive for dysphoric mood. Negative for suicidal ideas and sleep disturbance. The patient is nervous/anxious.    BP 136/74  Pulse 80  Temp 98.9 F (37.2 C)  Resp 18  Ht 5' (1.524 m)  Wt 182 lb (82.555 kg)  BMI 35.54 kg/m2  SpO2 96%     Objective:   Physical Exam  Constitutional: She is oriented to person, place, and time. She appears well-developed and well-nourished. No distress.  HENT:  Head: Normocephalic and atraumatic.  Right Ear: External ear normal.  Left Ear: External ear normal.  Nose: Nose normal.  Mouth/Throat: Oropharynx is clear and moist. No oropharyngeal exudate.  Eyes: Conjunctivae are normal. Pupils are equal, round, and reactive to light. Right eye exhibits no discharge. Left eye exhibits no discharge. No scleral icterus.  Neck: Normal range of motion. Neck supple. No tracheal deviation present. No thyromegaly present.  Cardiovascular: Normal rate, regular rhythm, normal heart sounds and intact distal pulses.  Exam reveals no gallop and no friction rub.   No murmur heard. Pulmonary/Chest: Effort normal and breath sounds normal. No respiratory distress. She has no wheezes. She has no rales. She exhibits no tenderness.  Abdominal: Soft. Bowel sounds are normal. She exhibits no distension and no mass. There is no tenderness. There is no rebound and no guarding.  Musculoskeletal: She exhibits no edema and no tenderness.       Lumbar back: She exhibits decreased range of motion and pain. She exhibits no tenderness.  Lymphadenopathy:    She  has no cervical adenopathy.  Neurological: She is alert and oriented to person, place, and time. No cranial nerve deficit. She exhibits normal muscle tone. Coordination normal.  Skin: Skin is warm and dry. No rash noted. She is not diaphoretic. No erythema. No pallor.  Psychiatric: Her behavior is normal. Judgment and thought content normal. Cognition and memory are normal. She exhibits a depressed mood.          Assessment & Plan:  1. Diabetes - patient reports poor control. Will check hemoglobin A1c with labs today. I think she would benefit from a longer acting insulin such as Lantus or Levemir, however, she does not seem open to this suggestion. For now, will continue NPH and regular insulin. She will record her blood sugars  and bring to her next visit. We'll have her followup in one month.  2. Chronic low back pain -patient with chronic low back pain which is secondary to spinal stenosis per her report. I think she might benefit from evaluation by orthopedics or the pain clinic with consideration for epidural injection. However, she reports being unable to afford co-pays for this evaluation. For now, we'll continue with hydrocodone. Will obtain records on previous evaluation and treatment.  3. Hypertension - patient with history of hypertension. Blood pressure is well-controlled today. We'll check renal function with labs today. She will followup in one month.  4. Hyperlipidemia - patient with history of hyperlipidemia. Currently on simvastatin 80 mg. Will check liver function test with labs today.  5. Depression -patient is tearful today describing isolation from her family and loss of her husband. Offered support today. If symptoms persistent, will discuss counseling and/or medications at her next visit.

## 2011-02-22 ENCOUNTER — Other Ambulatory Visit: Payer: Self-pay | Admitting: *Deleted

## 2011-02-22 LAB — HEMOGLOBIN A1C: Hgb A1c MFr Bld: 11.6 % — ABNORMAL HIGH (ref 4.6–6.5)

## 2011-02-22 MED ORDER — SULFAMETHOXAZOLE-TRIMETHOPRIM 800-160 MG PO TABS: 1.0000 | ORAL_TABLET | Freq: Two times a day (BID) | ORAL | Status: AC

## 2011-02-23 LAB — URINE CULTURE

## 2011-02-27 LAB — CBC
HCT: 38.7
Hemoglobin: 13.3
MCV: 88.3
Platelets: 270
RDW: 12.6

## 2011-02-27 LAB — DIFFERENTIAL
Basophils Absolute: 0
Eosinophils Absolute: 0.1
Eosinophils Relative: 1
Lymphocytes Relative: 26
Lymphs Abs: 1.8
Monocytes Absolute: 0.6

## 2011-02-27 LAB — I-STAT 8, (EC8 V) (CONVERTED LAB)
BUN: 14
Bicarbonate: 23.3
Chloride: 107
pCO2, Ven: 38.4 — ABNORMAL LOW
pH, Ven: 7.39 — ABNORMAL HIGH

## 2011-02-27 LAB — POCT I-STAT CREATININE
Creatinine, Ser: 0.8
Operator id: 146091

## 2011-02-28 ENCOUNTER — Telehealth: Payer: Self-pay | Admitting: Internal Medicine

## 2011-02-28 NOTE — Telephone Encounter (Signed)
Pt called to get her lab results

## 2011-03-01 ENCOUNTER — Telehealth: Payer: Self-pay | Admitting: Internal Medicine

## 2011-03-01 NOTE — Telephone Encounter (Signed)
Patient wants her lab results.

## 2011-03-01 NOTE — Telephone Encounter (Signed)
For some reason her labs were not routed to me. I saw her urine culture and she was treated, but I don't see that she was ever notified about her other labs. Labs show that blood sugar is very high.  We should make sure she has a follow up next week.

## 2011-03-01 NOTE — Telephone Encounter (Signed)
Patient notified. Appt scheduled.

## 2011-03-07 ENCOUNTER — Ambulatory Visit (INDEPENDENT_AMBULATORY_CARE_PROVIDER_SITE_OTHER): Payer: Medicare PPO | Admitting: Internal Medicine

## 2011-03-07 ENCOUNTER — Telehealth: Payer: Self-pay | Admitting: Internal Medicine

## 2011-03-07 ENCOUNTER — Encounter: Payer: Self-pay | Admitting: Internal Medicine

## 2011-03-07 DIAGNOSIS — R300 Dysuria: Secondary | ICD-10-CM

## 2011-03-07 DIAGNOSIS — E1169 Type 2 diabetes mellitus with other specified complication: Secondary | ICD-10-CM | POA: Insufficient documentation

## 2011-03-07 DIAGNOSIS — E669 Obesity, unspecified: Secondary | ICD-10-CM | POA: Insufficient documentation

## 2011-03-07 DIAGNOSIS — E039 Hypothyroidism, unspecified: Secondary | ICD-10-CM

## 2011-03-07 DIAGNOSIS — E119 Type 2 diabetes mellitus without complications: Secondary | ICD-10-CM

## 2011-03-07 MED ORDER — LEVOTHYROXINE SODIUM 150 MCG PO TABS: 150.0000 ug | ORAL_TABLET | Freq: Every day | ORAL | Status: DC

## 2011-03-07 MED ORDER — INSULIN DETEMIR 100 UNIT/ML ~~LOC~~ SOLN: 20.0000 [IU] | Freq: Every day | SUBCUTANEOUS | Status: DC

## 2011-03-07 NOTE — Telephone Encounter (Signed)
OK. She should use the samples until I see her back. I gave her a 3 months supply of Levemir. I think we can get her a voucher card for if.

## 2011-03-07 NOTE — Telephone Encounter (Signed)
Informed patient to use the samples until we see her back.

## 2011-03-07 NOTE — Telephone Encounter (Signed)
Patient states her insulin  Is $80.84, she cannot afford that. Her levothyroxine at home is 175

## 2011-03-07 NOTE — Progress Notes (Signed)
Subjective:    Patient ID: [name] [name], female    DOB: Dec 24, 1934, 76 y.o.   MRN: [id number]  HPI Ms. Gsell is a 76 year old female with a history of diabetes who presents for followup. Her recent blood work showed her hemoglobin A1c was elevated at 11%. Her fasting sugar today is 260. She reports that she did not take her medication today. She denies any low blood sugar.  She also presents to followup after recent urinary tract infection. She reports that symptoms are improved however she continues to have some burning and itching. She denies any fever, chills, flank pain.  Outpatient Prescriptions Prior to Visit  Medication Sig Dispense Refill  . amLODipine (NORVASC) 5 MG tablet Take 5 mg by mouth daily.        Marland Kitchen aspirin 81 MG tablet Take 162 mg by mouth daily.        . cephALEXin (KEFLEX) 250 MG capsule Take 1 tablet by mouth Daily.        . furosemide (LASIX) 40 MG tablet Take 40 mg by mouth 2 (two) times daily as needed.       Marland Kitchen HYDROcodone-acetaminophen (VICODIN) 5-500 MG per tablet Take 1 tablet by mouth as needed.        . isosorbide mononitrate (ISMO,MONOKET) 10 MG tablet Take 5 mg by mouth 2 (two) times daily.        Marland Kitchen lisinopril (PRINIVIL,ZESTRIL) 10 MG tablet Take 10 mg by mouth daily.        . meclizine (ANTIVERT) 25 MG tablet Take 25 mg by mouth daily.        . metFORMIN (GLUCOPHAGE) 500 MG tablet Take 1,000 mg by mouth 2 (two) times daily.        . metoprolol tartrate (LOPRESSOR) 25 MG tablet Take 1 tablet (25 mg total) by mouth 2 (two) times daily as needed.  60 tablet  11  . nabumetone (RELAFEN) 750 MG tablet Take 750 mg by mouth daily.        . nitroGLYCERIN (NITROSTAT) 0.4 MG SL tablet Place 0.4 mg under the tongue every 5 (five) minutes as needed.        . potassium chloride SA (K-DUR,KLOR-CON) 20 MEQ tablet Take 20 mEq by mouth 2 (two) times daily.        . simvastatin (ZOCOR) 80 MG tablet Take 80 mg by mouth daily.        Marland Kitchen HUMULIN N 100 UNIT/ML injection as  directed.      Marland Kitchen HUMULIN R 100 UNIT/ML injection as directed.      Marland Kitchen levothyroxine (SYNTHROID, LEVOTHROID) 200 MCG tablet Take 200 mcg by mouth daily.          Review of Systems  Constitutional: Negative for fever, chills, appetite change, fatigue and unexpected weight change.  HENT: Negative for ear pain, congestion, sore throat, trouble swallowing, neck pain, voice change and sinus pressure.   Eyes: Positive for pain (after applying makeup this morning) and discharge. Negative for visual disturbance.  Respiratory: Negative for cough, shortness of breath, wheezing and stridor.   Cardiovascular: Negative for chest pain, palpitations and leg swelling.  Gastrointestinal: Negative for nausea, vomiting, abdominal pain, diarrhea, constipation, blood in stool, abdominal distention and anal bleeding.  Genitourinary: Negative for dysuria and flank pain.  Musculoskeletal: Positive for myalgias and back pain. Negative for arthralgias and gait problem.  Skin: Negative for color change and rash.  Neurological: Negative for dizziness and headaches.  Hematological: Negative for adenopathy. Does not bruise/bleed  easily.  Psychiatric/Behavioral: Negative for suicidal ideas, sleep disturbance and dysphoric mood. The patient is not nervous/anxious.    BP 152/72  Pulse 67  Temp(Src) 98.5 F (36.9 C) (Oral)  Resp 18  Ht 5' (1.524 m)  Wt 223 lb 4 oz (101.266 kg)  BMI 43.60 kg/m2  SpO2 97%     Objective:   Physical Exam  Constitutional: She is oriented to person, place, and time. She appears well-developed and well-nourished. No distress.  HENT:  Head: Normocephalic and atraumatic.  Right Ear: External ear normal.  Left Ear: External ear normal.  Nose: Nose normal.  Mouth/Throat: Oropharynx is clear and moist. No oropharyngeal exudate.  Eyes: Conjunctivae are normal. Pupils are equal, round, and reactive to light. Right eye exhibits no discharge. Left eye exhibits no discharge. No scleral icterus.    Neck: Normal range of motion. Neck supple. No tracheal deviation present. No thyromegaly present.  Cardiovascular: Normal rate, regular rhythm, normal heart sounds and intact distal pulses.  Exam reveals no gallop and no friction rub.   No murmur heard. Pulmonary/Chest: Effort normal and breath sounds normal. No respiratory distress. She has no wheezes. She has no rales. She exhibits no tenderness.  Musculoskeletal: Normal range of motion. She exhibits no edema and no tenderness.  Lymphadenopathy:    She has no cervical adenopathy.  Neurological: She is alert and oriented to person, place, and time. No cranial nerve deficit. She exhibits normal muscle tone. Coordination normal.  Skin: Skin is warm and dry. No rash noted. She is not diaphoretic. No erythema. No pallor.  Psychiatric: She has a normal mood and affect. Her behavior is normal. Judgment and thought content normal.          Assessment & Plan:  1. Diabetes -poorly controlled on current regimen. We discussed changing to levemir. She was given samples of levemir today and instructed on how to use these. She started with an injection of 10 units today. She will increase to 20 units tomorrow and take 20 units daily until her return visit in one week. She will call if her blood sugar is less than 80 or over 250.  2. Dysuria - patient with recent urinary tract infection and persistent dysuria. Will recheck urinalysis and urine culture today.  3. Hypothyroidism - patient was noted to have over suppressed TSH on labs. Will decrease her Synthroid dose to 150 mcg daily and plan to repeat her TSH in 6 weeks.

## 2011-03-07 NOTE — Patient Instructions (Signed)
Stop Regular Insulin. Stop NPH Insulin. Start Levemir 20 units daily. Call if blood sugar is less than 80 or above 250. Return in 1 week.

## 2011-03-08 LAB — POCT URINALYSIS DIPSTICK
Blood, UA: NEGATIVE
Leukocytes, UA: NEGATIVE
Nitrite, UA: NEGATIVE
Protein, UA: NEGATIVE
Urobilinogen, UA: 0.2
pH, UA: 6

## 2011-03-10 LAB — URINE CULTURE
Colony Count: NO GROWTH
Organism ID, Bacteria: NO GROWTH

## 2011-03-15 ENCOUNTER — Ambulatory Visit: Payer: Medicare PPO | Admitting: Internal Medicine

## 2011-03-28 ENCOUNTER — Ambulatory Visit (INDEPENDENT_AMBULATORY_CARE_PROVIDER_SITE_OTHER): Payer: Medicare PPO | Admitting: Internal Medicine

## 2011-03-28 ENCOUNTER — Encounter: Payer: Self-pay | Admitting: Internal Medicine

## 2011-03-28 DIAGNOSIS — G8929 Other chronic pain: Secondary | ICD-10-CM

## 2011-03-28 DIAGNOSIS — E119 Type 2 diabetes mellitus without complications: Secondary | ICD-10-CM

## 2011-03-28 DIAGNOSIS — M549 Dorsalgia, unspecified: Secondary | ICD-10-CM

## 2011-03-28 DIAGNOSIS — E039 Hypothyroidism, unspecified: Secondary | ICD-10-CM

## 2011-03-28 MED ORDER — INSULIN GLARGINE 100 UNIT/ML ~~LOC~~ SOLN: 40.0000 [IU] | Freq: Every day | SUBCUTANEOUS | Status: DC

## 2011-03-28 MED ORDER — NABUMETONE 750 MG PO TABS: 750.0000 mg | ORAL_TABLET | Freq: Every day | ORAL | Status: DC

## 2011-03-28 NOTE — Patient Instructions (Signed)
Start Lantus 40units daily. Check blood sugars twice daily. Call if blood sugar is less than 80 or greater than 250.  Start Novolog 4units BEFORE meals if blood sugar is greater than 250.

## 2011-03-28 NOTE — Progress Notes (Signed)
Subjective:    Patient ID: [name] [name], female    DOB: 06/30/1934, 76 y.o.   MRN: [id number]  HPI 76 year old female with diabetes mellitus presents for followup. She reports that her blood sugars continue to be elevated, however slightly improved with the use of Levemir. She is currently up to 40 units per day. However, she reports that she is unable to afford this medication. Her out-of-pocket costs over $80 per month. She is interested in considering alternative medications. She reports that over the last month most of her fasting blood sugars have been just below 200. She did have one low blood sugar which cause some diaphoresis and woke her out of her sleep. She reports making effort to her diet by eating small meals to help improve her blood sugar.  She is also concerned about persistent low back pain. She reports that the back pain persists despite the use of anti-inflammatory medicines. He has been present for years. She was told in the past the she abnormality of her spine including spinal stenosis. She reports being unable to afford co-pays for followup at the pain clinic. She denies any loss of continence of her bowel or bladder. She does have weakness in both of her lower extremities.  Outpatient Encounter Prescriptions as of 03/28/2011  Medication Sig Dispense Refill  . amLODipine (NORVASC) 5 MG tablet Take 5 mg by mouth daily.        Marland Kitchen aspirin 81 MG tablet Take 162 mg by mouth daily.        . cephALEXin (KEFLEX) 250 MG capsule Take 1 tablet by mouth Daily.        . furosemide (LASIX) 40 MG tablet Take 40 mg by mouth 2 (two) times daily as needed.       Marland Kitchen HYDROcodone-acetaminophen (VICODIN) 5-500 MG per tablet Take 1 tablet by mouth as needed.        . isosorbide mononitrate (ISMO,MONOKET) 10 MG tablet Take 5 mg by mouth 2 (two) times daily.        Marland Kitchen levothyroxine (LEVOTHROID) 150 MCG tablet Take 1 tablet (150 mcg total) by mouth daily.  30 tablet  11  . lisinopril  (PRINIVIL,ZESTRIL) 10 MG tablet Take 10 mg by mouth daily.        . meclizine (ANTIVERT) 25 MG tablet Take 25 mg by mouth daily.        . metFORMIN (GLUCOPHAGE) 500 MG tablet Take 1,000 mg by mouth 2 (two) times daily.        . metoprolol tartrate (LOPRESSOR) 25 MG tablet Take 1 tablet (25 mg total) by mouth 2 (two) times daily as needed.  60 tablet  11  . nitroGLYCERIN (NITROSTAT) 0.4 MG SL tablet Place 0.4 mg under the tongue every 5 (five) minutes as needed.        . potassium chloride SA (K-DUR,KLOR-CON) 20 MEQ tablet Take 20 mEq by mouth 2 (two) times daily.        . simvastatin (ZOCOR) 80 MG tablet Take 80 mg by mouth daily.           Review of Systems  Constitutional: Negative for fever, chills, appetite change, fatigue and unexpected weight change.  HENT: Positive for congestion. Negative for ear pain and neck pain.   Eyes: Negative for visual disturbance.  Respiratory: Negative for cough, shortness of breath and wheezing.   Cardiovascular: Negative for chest pain, palpitations and leg swelling.  Gastrointestinal: Negative for vomiting, abdominal pain and abdominal distention.  Genitourinary: Negative  for dysuria and flank pain.  Musculoskeletal: Positive for myalgias and back pain. Negative for arthralgias and gait problem.  Skin: Negative for color change and rash.  Neurological: Negative for dizziness and headaches.  Hematological: Negative for adenopathy. Does not bruise/bleed easily.  Psychiatric/Behavioral: Negative for suicidal ideas, sleep disturbance and dysphoric mood. The patient is not nervous/anxious.    BP 142/60  Pulse 68  Temp(Src) 97.7 F (36.5 C) (Oral)  Wt 220 lb (99.791 kg)  SpO2 97%     Objective:   Physical Exam  Constitutional: She is oriented to person, place, and time. She appears well-developed and well-nourished. No distress.  HENT:  Head: Normocephalic and atraumatic.  Right Ear: External ear normal.  Left Ear: External ear normal.  Nose:  Nose normal.  Mouth/Throat: Oropharynx is clear and moist. No oropharyngeal exudate.  Eyes: Conjunctivae are normal. Pupils are equal, round, and reactive to light. Right eye exhibits no discharge. Left eye exhibits no discharge. No scleral icterus.  Neck: Normal range of motion. Neck supple. No tracheal deviation present. No thyromegaly present.  Cardiovascular: Normal rate, regular rhythm, normal heart sounds and intact distal pulses.  Exam reveals no gallop and no friction rub.   No murmur heard. Pulmonary/Chest: Effort normal and breath sounds normal. No respiratory distress. She has no wheezes. She has no rales. She exhibits no tenderness.  Musculoskeletal: She exhibits no edema and no tenderness.       Lumbar back: She exhibits decreased range of motion, tenderness, pain and spasm.  Lymphadenopathy:    She has no cervical adenopathy.  Neurological: She is alert and oriented to person, place, and time. No cranial nerve deficit. She exhibits normal muscle tone. Coordination normal.  Skin: Skin is warm and dry. No rash noted. She is not diaphoretic. No erythema. No pallor.  Psychiatric: She has a normal mood and affect. Her behavior is normal. Judgment and thought content normal.          Assessment & Plan:  1. Diabetes - will change to Lantus as this is a covered medication on her plan. We'll start with 40 units daily. We'll also add NovoLog 4 units prior to a large meal if her blood sugars greater than 250. Will check hemoglobin A1c in one month. She will followup in one month. She will call immediately if any blood sugars less than 80 or greater than 250.  2. Chronic back pain -discussed referral to neurosurgery today. She is willing to set up his referral as long as it is over a month from now so she can save money for her co-pay. We will need to obtain results of her previous MRI prior to referral. She will continue to use nambutone as needed for pain.

## 2011-04-08 ENCOUNTER — Telehealth: Payer: Self-pay | Admitting: Internal Medicine

## 2011-04-08 NOTE — Telephone Encounter (Signed)
Can you look into this?  Thanks. 

## 2011-04-08 NOTE — Telephone Encounter (Signed)
Patient wants to nurse to call her about her insuline .

## 2011-04-08 NOTE — Telephone Encounter (Signed)
Patient about out of Sample Lantus given in office. Informed patient that she had a Rx available at [facility] on [address] for her Lantus. Pt is going to call pharmacy and see what the cost of medication is.

## 2011-04-15 ENCOUNTER — Ambulatory Visit: Payer: Medicare PPO | Admitting: Internal Medicine

## 2011-04-25 ENCOUNTER — Ambulatory Visit (INDEPENDENT_AMBULATORY_CARE_PROVIDER_SITE_OTHER): Payer: Medicare PPO | Admitting: Internal Medicine

## 2011-04-25 ENCOUNTER — Encounter: Payer: Self-pay | Admitting: Internal Medicine

## 2011-04-25 ENCOUNTER — Telehealth: Payer: Self-pay | Admitting: Internal Medicine

## 2011-04-25 DIAGNOSIS — E119 Type 2 diabetes mellitus without complications: Secondary | ICD-10-CM

## 2011-04-25 DIAGNOSIS — M545 Low back pain, unspecified: Secondary | ICD-10-CM

## 2011-04-25 DIAGNOSIS — L0291 Cutaneous abscess, unspecified: Secondary | ICD-10-CM

## 2011-04-25 DIAGNOSIS — L0390 Cellulitis, unspecified: Secondary | ICD-10-CM

## 2011-04-25 DIAGNOSIS — E039 Hypothyroidism, unspecified: Secondary | ICD-10-CM

## 2011-04-25 LAB — COMPREHENSIVE METABOLIC PANEL
ALT: 12 U/L (ref 0–35)
AST: 19 U/L (ref 0–37)
Albumin: 4.2 g/dL (ref 3.5–5.2)
Calcium: 9.2 mg/dL (ref 8.4–10.5)
Chloride: 102 mEq/L (ref 96–112)
Creatinine, Ser: 0.9 mg/dL (ref 0.4–1.2)
Potassium: 3.5 mEq/L (ref 3.5–5.1)
Sodium: 140 mEq/L (ref 135–145)

## 2011-04-25 LAB — TSH: TSH: 0.39 u[IU]/mL (ref 0.35–5.50)

## 2011-04-25 LAB — HEMOGLOBIN A1C: Hgb A1c MFr Bld: 9.2 % — ABNORMAL HIGH (ref 4.6–6.5)

## 2011-04-25 MED ORDER — INSULIN REGULAR HUMAN 100 UNIT/ML IJ SOLN: INTRAMUSCULAR | Status: DC

## 2011-04-25 MED ORDER — HYDROCODONE-ACETAMINOPHEN 5-500 MG PO TABS: 1.0000 | ORAL_TABLET | Freq: Four times a day (QID) | ORAL | Status: DC | PRN

## 2011-04-25 MED ORDER — GENTAMICIN SULFATE 0.1 % EX OINT: TOPICAL_OINTMENT | Freq: Three times a day (TID) | CUTANEOUS | Status: DC

## 2011-04-25 NOTE — Telephone Encounter (Signed)
[phone] Pt called to let you know the ointment she was using  Clotrim/betadiprop  cre   Mupirocin ointment usp 2%   22grams nyspatin cream usp 100,000 units per gram  Nerve meds gapapentan

## 2011-04-25 NOTE — Progress Notes (Signed)
Subjective:    Patient ID: [name] [name], female    DOB: [birth year], 76 y.o.   MRN: [id number]  HPI 76 year old female with a history of diabetes, hypertension, and chronic back pain presents for followup. In regards to her diabetes, she reports that her blood sugars have been fairly well controlled with most blood sugars below 200. She reports full compliance with her Lantus insulin. 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 She would prefer to use regular insulin versus NovoLog. She denies any low blood sugars. She did have one blood sugar above 400 after eating cake on her birthday.  In regards to her chronic back pain she reports that her symptoms are gradually worsening. She notes that she has been spending prolonged periods sewing which has exacerbated her pain. She has been using hydrocodone on occasion for severe pain. She notes that she was contacted by neurosurgery for her appointment however they will not see her until MRI reports from her previous physician are available. She notes some difficulty with her gait secondary to her severe lower back pain. She also notes some worsening pain in her right wrist which she attributes to prolonged period of time spent sewing recently.  She also notes recent skin lesions on her upper back and right arm. She notes that these lesions are red and have occurred in places where she scratched. She notes a history in the past of MRSA. She has not been applying any topical ointments to these lesions. She denies any fever or chills.  Outpatient Encounter Prescriptions as of 04/25/2011  Medication Sig Dispense Refill  . amLODipine (NORVASC) 5 MG tablet Take 5 mg by mouth daily.        Marland Kitchen aspirin 81 MG tablet Take 162 mg by mouth daily.        . cephALEXin (KEFLEX) 250 MG capsule Take 1 tablet by mouth Daily.        . furosemide (LASIX)  40 MG tablet Take 40 mg by mouth 2 (two) times daily as needed.       Marland Kitchen HYDROcodone-acetaminophen (VICODIN) 5-500 MG per tablet Take 1 tablet by mouth every 6 (six) hours as needed.  90 tablet  2  . insulin glargine (LANTUS) 100 UNIT/ML injection Inject 40 Units into the skin at bedtime.  10 mL  12  . isosorbide mononitrate (ISMO,MONOKET) 10 MG tablet Take 5 mg by mouth 2 (two) times daily.        Marland Kitchen levothyroxine (LEVOTHROID) 150 MCG tablet Take 1 tablet (150 mcg total) by mouth daily.  30 tablet  11  . lisinopril (PRINIVIL,ZESTRIL) 10 MG tablet Take 10 mg by mouth daily.        . meclizine (ANTIVERT) 25 MG tablet Take 25 mg by mouth daily.        . metFORMIN (GLUCOPHAGE) 500 MG tablet Take 1,000 mg by mouth 2 (two) times daily.        . metoprolol tartrate (LOPRESSOR) 25 MG tablet Take 1 tablet (25 mg total) by mouth 2 (two) times daily as needed.  60 tablet  11  . nabumetone (RELAFEN) 750 MG tablet Take 1 tablet (750 mg total) by mouth daily.  90 tablet  3  . nitroGLYCERIN (NITROSTAT) 0.4 MG SL tablet Place 0.4 mg under the tongue every 5 (five) minutes as needed.        Marland Kitchen  potassium chloride SA (K-DUR,KLOR-CON) 20 MEQ tablet Take 20 mEq by mouth 2 (two) times daily.        . simvastatin (ZOCOR) 80 MG tablet Take 80 mg by mouth daily.          Review of Systems  Constitutional: Negative for fever, chills, appetite change, fatigue and unexpected weight change.  HENT: Negative for ear pain, congestion, neck pain and sinus pressure.   Eyes: Negative for visual disturbance.  Respiratory: Negative for cough, shortness of breath, wheezing and stridor.   Cardiovascular: Negative for chest pain, palpitations and leg swelling.  Genitourinary: Negative for dysuria and flank pain.  Musculoskeletal: Positive for myalgias, back pain and arthralgias. Negative for gait problem.  Skin: Negative for color change and rash.  Neurological: Negative for dizziness and headaches.  Hematological: Negative for  adenopathy. Does not bruise/bleed easily.  Psychiatric/Behavioral: Negative for suicidal ideas, sleep disturbance and dysphoric mood. The patient is not nervous/anxious.    BP 124/62  Pulse 66  Temp(Src) 97.6 F (36.4 C) (Oral)  Wt 220 lb (99.791 kg)  SpO2 97%     Objective:   Physical Exam  Constitutional: She is oriented to person, place, and time. She appears well-developed and well-nourished. No distress.  HENT:  Head: Normocephalic and atraumatic.  Right Ear: External ear normal.  Left Ear: External ear normal.  Nose: Nose normal.  Mouth/Throat: Oropharynx is clear and moist. No oropharyngeal exudate.  Eyes: Conjunctivae are normal. Pupils are equal, round, and reactive to light. Right eye exhibits no discharge. Left eye exhibits no discharge. No scleral icterus.  Neck: Normal range of motion. Neck supple. No tracheal deviation present. No thyromegaly present.  Cardiovascular: Normal rate, regular rhythm, normal heart sounds and intact distal pulses.  Exam reveals no gallop and no friction rub.   No murmur heard. Pulmonary/Chest: Effort normal and breath sounds normal. No respiratory distress. She has no wheezes. She has no rales. She exhibits no tenderness.  Musculoskeletal: She exhibits no edema and no tenderness.       Right wrist: She exhibits tenderness.       Lumbar back: She exhibits decreased range of motion, tenderness and pain.  Lymphadenopathy:    She has no cervical adenopathy.  Neurological: She is alert and oriented to person, place, and time. No cranial nerve deficit. She exhibits normal muscle tone. Coordination normal.  Skin: Skin is warm and dry. No rash noted. She is not diaphoretic. No erythema. No pallor.       Few excoriated areas on upper back and right arm, with minimal surrounding erythema  Psychiatric: She has a normal mood and affect. Her behavior is normal. Judgment and thought content normal.          Assessment & Plan:  1. Diabetes mellitus  - patient reports good control of blood sugars. Will check hemoglobin A1c with labs today. She prefers to use Lantus and regular insulin instead of NovoLog for her sliding scale. We discussed that regular insulin is quite long acting, however given cost we'll proceed with the use of regular insulin. She will use Lantus 40 units daily. She will use 4 units of regular insulin for blood sugars greater than 250. She will followup in 2 months.  2. Chronic low back pain - Will request MRI lumbar spine from previous physician (second request). Referral to neurosurgery in process, but pending MRI result. Will continue hydrocodone prn severe pain. Follow up 2 months.  3. Cellultitis - Will use gentamicin bid prn.  She will call if symptoms not improving or if fever/chills.

## 2011-05-16 ENCOUNTER — Telehealth: Payer: Self-pay | Admitting: Cardiovascular Disease

## 2011-05-16 NOTE — Telephone Encounter (Signed)
Pt c/o chest discomfort for several days

## 2011-05-16 NOTE — Telephone Encounter (Signed)
Spoke to [name] [name], she has had CP for 1 week, has tried antacid with no relief. Has been taking NTG x 3 with some relief. [name] [name] last seen in office by Dr. Mariah Milling 01/25/11 doing well at visit; h/o CAD, s/p PTCA balloon in-stent restenosis RCA. She had adenosine myoview 07/2008 with normal perfusion. Added [name] [name] on to see Dr. Elease Hashimoto tomorrow, she has to depend on a ride and can come in tomorrow. I advised she go to the ER with her symptoms, she does not want to go at this time. I advised if sx worsen over night/ or unstable to call 911. She agrees. Will f/u in clinic tomorrow.

## 2011-05-16 NOTE — Telephone Encounter (Signed)
Attempted to call pt back, line busy. Will try again later.

## 2011-05-17 ENCOUNTER — Encounter: Payer: Self-pay | Admitting: Cardiovascular Disease

## 2011-05-17 ENCOUNTER — Ambulatory Visit (INDEPENDENT_AMBULATORY_CARE_PROVIDER_SITE_OTHER): Payer: Medicare PPO | Admitting: Cardiovascular Disease

## 2011-05-17 ENCOUNTER — Inpatient Hospital Stay: Payer: Self-pay | Admitting: Internal Medicine

## 2011-05-17 DIAGNOSIS — E119 Type 2 diabetes mellitus without complications: Secondary | ICD-10-CM

## 2011-05-17 DIAGNOSIS — R079 Chest pain, unspecified: Secondary | ICD-10-CM

## 2011-05-17 DIAGNOSIS — I10 Essential (primary) hypertension: Secondary | ICD-10-CM

## 2011-05-17 DIAGNOSIS — E785 Hyperlipidemia, unspecified: Secondary | ICD-10-CM

## 2011-05-17 DIAGNOSIS — R0602 Shortness of breath: Secondary | ICD-10-CM

## 2011-05-17 DIAGNOSIS — I2510 Atherosclerotic heart disease of native coronary artery without angina pectoris: Secondary | ICD-10-CM

## 2011-05-17 MED ORDER — NITROGLYCERIN 0.4 MG SL SUBL: 0.4000 mg | SUBLINGUAL_TABLET | SUBLINGUAL | Status: DC | PRN

## 2011-05-17 MED ORDER — OMEPRAZOLE 20 MG PO CPDR: 20.0000 mg | DELAYED_RELEASE_CAPSULE | Freq: Two times a day (BID) | ORAL | Status: DC

## 2011-05-17 MED ORDER — SIMVASTATIN 20 MG PO TABS: 20.0000 mg | ORAL_TABLET | Freq: Every day | ORAL | Status: DC

## 2011-05-17 NOTE — Assessment & Plan Note (Addendum)
Patient presents with a history of chest pain and chest tightness for the past 8 days. She's had relatively constant chest pain. Despite this she has not had any EKG changes.  We gave her some nitroglycerin in the office and he did not relieve her chest pain.  Her chest pains are somewhat atypical but she does have significant risk factors including a previous history of coronary artery disease and a previous episode of in-stent restenosis.  She finally agreed to be admitted for further evaluation.  I have spoken to Dr. Allena Katz who has accepted her for admission.  Rec:  Admit to step down or ICU Serial CK/MB, D-dimer, other admission labs IV heparin  ? IV NtG depending on CP We will follow through the weekend. She will need a cath on Monday if her enzymes are elevated.  She will need an urgent cath during the weekend if she becomes unstable.

## 2011-05-17 NOTE — Progress Notes (Signed)
[name] [name] Date of Birth  Aug 26, 1934 [facility]     [place] [facility]  [address]    [address]   [address] [place], [place]  [address]    [place], [place]  [address] [phone]  Fax  [phone]  [phone]  Fax [phone]   History of Present Illness:  [name] is a 76 yo with a history of coronary artery disease, hypertension, hypercholesterolemia, and diabetes mellitus.. She status post PTCA and stenting in the past. She is also status post balloon reexpansion of her stent for in-stent restenosis. She presents today with a recent onset of chest heaviness.  It was described as an intense heaviness - like a ton of bricks.    She saw Dr. Mariah Milling in September.  She was having some symptoms of palpitations but was not having any any the chest heaviness.  The pain has been there constantly for the past 8 days. It finally eased up slightly last night but has returned today. She tried old nitroglycerin but that did not help.  The nitroglycerin did still burn under her tongue a little bit. She tried some Gas-X which seemed to help a little bit.    The chest tightness did not worsen with exertion. She does not get any exercise.  She has not been eating any extra salt. Her blood pressure has been little bit higher than normal recently.   Current Outpatient Prescriptions on File Prior to Visit  Medication Sig Dispense Refill  . amLODipine (NORVASC) 5 MG tablet Take 5 mg by mouth daily.        Marland Kitchen aspirin 81 MG tablet Take 81 mg by mouth daily.       . cephALEXin (KEFLEX) 250 MG capsule Take 1 tablet by mouth Daily.        . furosemide (LASIX) 40 MG tablet Take 40 mg by mouth 2 (two) times daily as needed.       Marland Kitchen HYDROcodone-acetaminophen (VICODIN) 5-500 MG per tablet Take 1 tablet by mouth every 6 (six) hours as needed.  90 tablet  2  . insulin glargine (LANTUS) 100 UNIT/ML injection Inject 40 Units into the skin at bedtime.  10 mL  12  . isosorbide mononitrate  (ISMO,MONOKET) 10 MG tablet Take 5 mg by mouth 2 (two) times daily.        Marland Kitchen levothyroxine (LEVOTHROID) 150 MCG tablet Take 1 tablet (150 mcg total) by mouth daily.  30 tablet  11  . lisinopril (PRINIVIL,ZESTRIL) 10 MG tablet Take 10 mg by mouth daily.        . meclizine (ANTIVERT) 25 MG tablet Take 25 mg by mouth daily.        . metFORMIN (GLUCOPHAGE) 500 MG tablet Take 1,000 mg by mouth 2 (two) times daily.        . nabumetone (RELAFEN) 750 MG tablet Take 1 tablet (750 mg total) by mouth daily.  90 tablet  3  . nitroGLYCERIN (NITROSTAT) 0.4 MG SL tablet Place 0.4 mg under the tongue every 5 (five) minutes as needed.        . potassium chloride SA (K-DUR,KLOR-CON) 20 MEQ tablet Take 20 mEq by mouth 2 (two) times daily.        . simvastatin (ZOCOR) 80 MG tablet Take 80 mg by mouth daily.          No Known Allergies  Past Medical History  Diagnosis Date  . DM (diabetes mellitus)   . Atrial flutter   .  Dyslipidemia   . CAD (coronary artery disease)   . Osteoarthritis   . Hypothyroidism     hx  . Rheumatoid arthritis   . Obesity   . GERD (gastroesophageal reflux disease)   . Depression     Past Surgical History  Procedure Date  . Rca stent placement 2000  . Restenosis with ptca placement 2005  . Cataract surgery   . Partial hysterectomy     History  Smoking status  . Never Smoker   Smokeless tobacco  . Not on file  Comment: former passive smoker    History  Alcohol Use No    Family History  Problem Relation Age of Onset  . Esophageal cancer Mother   . Diabetes Mother   . Cancer Mother     esophageal     Reviw of Systems:  Reviewed in the HPI.  All other systems are negative.  Physical Exam: BP 156/68  Pulse 61  Ht 5\' 3"  (1.6 m)  Wt 222 lb 12.8 oz (101.061 kg)  BMI 39.47 kg/m2 The patient is alert and oriented x 3.  The mood and affect are normal.   Skin: warm and dry.  Color is normal.    HEENT:   [place]/AT, normal carotids, no JVD  Lungs: clear    Heart: RR, no murmurs    Abdomen: normal BS, nontender,  Extremities:  No c/c/e  Neuro:  Nonfocal, gait is normal.    ECG: NSR, Poor R wave progression, no ST or T changes  Assessment / Plan:

## 2011-05-17 NOTE — Assessment & Plan Note (Signed)
Her BP remains mildly elevated.  She will likely need some additional meds.

## 2011-05-17 NOTE — Assessment & Plan Note (Signed)
Continue current meds. Plans per Internal Medicine.

## 2011-05-17 NOTE — Patient Instructions (Signed)
Pt will be sent to hospital to be admitted.

## 2011-05-17 NOTE — Assessment & Plan Note (Signed)
She's on simvastatin 80 mg a day. She's also on amlodipine. We will need to decrease her simvastatin to 20 mg a day. We will send in a new prescription for this. She will try to break her current tablets into 1/4 tabs.

## 2011-05-18 ENCOUNTER — Encounter: Payer: Self-pay | Admitting: Cardiovascular Disease

## 2011-05-18 DIAGNOSIS — I2510 Atherosclerotic heart disease of native coronary artery without angina pectoris: Secondary | ICD-10-CM

## 2011-05-18 DIAGNOSIS — R079 Chest pain, unspecified: Secondary | ICD-10-CM

## 2011-05-21 HISTORY — PX: REPLACEMENT TOTAL KNEE: SUR1224

## 2011-05-29 ENCOUNTER — Ambulatory Visit: Payer: Medicare PPO | Admitting: Internal Medicine

## 2011-06-06 ENCOUNTER — Ambulatory Visit: Payer: Medicare PPO | Admitting: Internal Medicine

## 2011-06-07 ENCOUNTER — Encounter: Payer: Medicare PPO | Admitting: Cardiovascular Disease

## 2011-06-11 ENCOUNTER — Encounter: Payer: Self-pay | Admitting: Cardiovascular Disease

## 2011-06-11 ENCOUNTER — Ambulatory Visit (INDEPENDENT_AMBULATORY_CARE_PROVIDER_SITE_OTHER): Payer: Medicare Other | Admitting: Cardiovascular Disease

## 2011-06-11 DIAGNOSIS — I2510 Atherosclerotic heart disease of native coronary artery without angina pectoris: Secondary | ICD-10-CM

## 2011-06-11 DIAGNOSIS — I10 Essential (primary) hypertension: Secondary | ICD-10-CM

## 2011-06-11 DIAGNOSIS — E785 Hyperlipidemia, unspecified: Secondary | ICD-10-CM

## 2011-06-11 DIAGNOSIS — E119 Type 2 diabetes mellitus without complications: Secondary | ICD-10-CM

## 2011-06-11 NOTE — Assessment & Plan Note (Signed)
Currently with no symptoms of angina. No further workup at this time. Continue current medication regimen. 

## 2011-06-11 NOTE — Patient Instructions (Signed)
You are doing well. No medication changes were made.  Please call us if you have new issues that need to be addressed before your next appt.  Your physician wants you to follow-up in: 6 months.  You will receive a reminder letter in the mail two months in advance. If you don't receive a letter, please call our office to schedule the follow-up appointment.   

## 2011-06-11 NOTE — Assessment & Plan Note (Signed)
We have encouraged continued exercise, careful diet management in an effort to lose weight. 

## 2011-06-11 NOTE — Assessment & Plan Note (Signed)
She reports that she takes simvastatin 20 mg daily. She has been on a lower dose for one month. I would agree with the decreased dose given she is on amlodipine.  If she does not have adequately controlled cholesterol, we could change the medication to Lipitor or Crestor.

## 2011-06-11 NOTE — Assessment & Plan Note (Signed)
Blood pressure is well controlled on today's visit. No changes made to the medications. Blood pressure did improve on recheck. I suspect it was elevated initially secondary to worsening knee pain with walking.

## 2011-06-11 NOTE — Progress Notes (Addendum)
Patient ID: [name] [name], female    DOB: 1934/11/13, 77 y.o.   MRN: [id number]  HPI Comments: Ms. Bergquist is a 77 year old woman with a history of CAD, status post PTCA/cutting balloon angioplasty for in-stent restenosis of the RCA, adenosine Myoview on 3/10 that showed normal perfusion, Who presents for routine followup.  She reports that she was seen recently in our clinic and was having chest pain. She was sent over to the hospital, workup including cardiac enzymes and a stress test. Stress test showed no ischemia and she was discharged home. She has not had any significant chest pain since that time. She has had problems with her knee. She was climbing into a van and hurt her right knee and now uses a cane.  She continues to have problems with her weight.  She denies  lightheadedness or dizziness.  EKG shows normal sinus rhythm , left axis deviation, unable to exclude old anterior infarct, T wave abnormality in inferior leads       Outpatient Encounter Prescriptions as of 06/11/2011  Medication Sig Dispense Refill  . amLODipine (NORVASC) 5 MG tablet Take 5 mg by mouth daily.        Marland Kitchen aspirin 81 MG tablet Take 162 mg by mouth daily.       . cephALEXin (KEFLEX) 250 MG capsule Take 1 tablet by mouth Daily.        . furosemide (LASIX) 40 MG tablet Take 40 mg by mouth 2 (two) times daily as needed.       Marland Kitchen HYDROcodone-acetaminophen (VICODIN) 5-500 MG per tablet Take 1 tablet by mouth every 6 (six) hours as needed.  90 tablet  2  . insulin glargine (LANTUS) 100 UNIT/ML injection Inject 40 Units into the skin at bedtime.  10 mL  12  . insulin regular (NOVOLIN R,HUMULIN R) 100 units/mL injection as needed. Take 4 units if blood sugar is >250.       Marland Kitchen isosorbide mononitrate (ISMO,MONOKET) 10 MG tablet Take 5 mg by mouth 2 (two) times daily.        Marland Kitchen levothyroxine (LEVOTHROID) 150 MCG tablet Take 1 tablet (150 mcg total) by mouth daily.  30 tablet  11  . lisinopril (PRINIVIL,ZESTRIL) 10 MG  tablet Take 10 mg by mouth daily.        . meclizine (ANTIVERT) 25 MG tablet Take 25 mg by mouth daily.        . metFORMIN (GLUCOPHAGE) 500 MG tablet Take 500 mg by mouth 2 (two) times daily.       . metoprolol tartrate (LOPRESSOR) 25 MG tablet Take 25 mg by mouth 2 (two) times daily as needed.       . nabumetone (RELAFEN) 750 MG tablet Take 1 tablet (750 mg total) by mouth daily.  90 tablet  3  . nitroGLYCERIN (NITROSTAT) 0.4 MG SL tablet Place 1 tablet (0.4 mg total) under the tongue every 5 (five) minutes as needed.  25 tablet  3  . omeprazole (PRILOSEC) 20 MG capsule Take 1 capsule (20 mg total) by mouth 2 (two) times daily.  60 capsule  6  . potassium chloride SA (K-DUR,KLOR-CON) 20 MEQ tablet Take 20 mEq by mouth 2 (two) times daily.        .  simvastatin (ZOCOR) 20 MG tablet Take 1 tablet (20 mg total) by mouth daily.  30 tablet  6     Review of Systems  Constitutional: Negative.   HENT: Negative.   Eyes: Negative.  Respiratory: Negative.   Cardiovascular: Negative.   Gastrointestinal: Negative.   Musculoskeletal: Positive for joint swelling, arthralgias and gait problem.  Skin: Negative.   Neurological: Negative.   Hematological: Negative.   Psychiatric/Behavioral: Negative.   All other systems reviewed and are negative.    BP 181/79  Pulse 67  Ht 5\' 3"  (1.6 m)  Wt 225 lb 6.4 oz (102.241 kg)  BMI 39.93 kg/m2 Repeat blood pressure is 140/70 Physical Exam  Nursing note and vitals reviewed. Constitutional: She is oriented to person, place, and time. She appears well-developed and well-nourished.  HENT:  Head: Normocephalic.  Nose: Nose normal.  Mouth/Throat: Oropharynx is clear and moist.  Eyes: Conjunctivae are normal. Pupils are equal, round, and reactive to light.  Neck: Normal range of motion. Neck supple. No JVD present.  Cardiovascular: Normal rate, regular rhythm, S1 normal, S2 normal, normal heart sounds and intact distal pulses.  Exam reveals no gallop and no  friction rub.   No murmur heard. Pulmonary/Chest: Effort normal and breath sounds normal. No respiratory distress. She has no wheezes. She has no rales. She exhibits no tenderness.  Abdominal: Soft. Bowel sounds are normal. She exhibits no distension. There is no tenderness.  Musculoskeletal: Normal range of motion. She exhibits no edema and no tenderness.  Lymphadenopathy:    She has no cervical adenopathy.  Neurological: She is alert and oriented to person, place, and time. Coordination normal.  Skin: Skin is warm and dry. No rash noted. No erythema.  Psychiatric: She has a normal mood and affect. Her behavior is normal. Judgment and thought content normal.         Assessment and Plan

## 2011-06-12 ENCOUNTER — Ambulatory Visit (INDEPENDENT_AMBULATORY_CARE_PROVIDER_SITE_OTHER)
Admission: RE | Admit: 2011-06-12 | Discharge: 2011-06-12 | Disposition: A | Discharge status: Home / Self Care | Payer: Medicare Other | Source: Ambulatory Visit | Attending: Internal Medicine | Admitting: Internal Medicine

## 2011-06-12 ENCOUNTER — Ambulatory Visit (INDEPENDENT_AMBULATORY_CARE_PROVIDER_SITE_OTHER): Payer: Medicare Other | Admitting: Internal Medicine

## 2011-06-12 ENCOUNTER — Encounter: Payer: Self-pay | Admitting: Internal Medicine

## 2011-06-12 ENCOUNTER — Telehealth: Payer: Self-pay | Admitting: *Deleted

## 2011-06-12 VITALS — BP 130/60 | HR 62 | Temp 98.0°F | Ht 63.0 in | Wt 224.0 lb

## 2011-06-12 DIAGNOSIS — M545 Low back pain, unspecified: Secondary | ICD-10-CM

## 2011-06-12 DIAGNOSIS — G8929 Other chronic pain: Secondary | ICD-10-CM

## 2011-06-12 DIAGNOSIS — M25561 Pain in right knee: Secondary | ICD-10-CM

## 2011-06-12 DIAGNOSIS — M25569 Pain in unspecified knee: Secondary | ICD-10-CM

## 2011-06-12 DIAGNOSIS — K219 Gastro-esophageal reflux disease without esophagitis: Secondary | ICD-10-CM

## 2011-06-12 DIAGNOSIS — N390 Urinary tract infection, site not specified: Secondary | ICD-10-CM

## 2011-06-12 DIAGNOSIS — M549 Dorsalgia, unspecified: Secondary | ICD-10-CM

## 2011-06-12 DIAGNOSIS — R42 Dizziness and giddiness: Secondary | ICD-10-CM

## 2011-06-12 DIAGNOSIS — I10 Essential (primary) hypertension: Secondary | ICD-10-CM

## 2011-06-12 DIAGNOSIS — E039 Hypothyroidism, unspecified: Secondary | ICD-10-CM

## 2011-06-12 DIAGNOSIS — E785 Hyperlipidemia, unspecified: Secondary | ICD-10-CM

## 2011-06-12 DIAGNOSIS — M1710 Unilateral primary osteoarthritis, unspecified knee: Secondary | ICD-10-CM

## 2011-06-12 DIAGNOSIS — M179 Osteoarthritis of knee, unspecified: Secondary | ICD-10-CM

## 2011-06-12 DIAGNOSIS — E119 Type 2 diabetes mellitus without complications: Secondary | ICD-10-CM

## 2011-06-12 MED ORDER — ISOSORBIDE MONONITRATE 10 MG PO TABS: 10.0000 mg | ORAL_TABLET | Freq: Two times a day (BID) | ORAL | Status: DC

## 2011-06-12 MED ORDER — NABUMETONE 750 MG PO TABS: 750.0000 mg | ORAL_TABLET | Freq: Every day | ORAL | Status: DC

## 2011-06-12 MED ORDER — LISINOPRIL 10 MG PO TABS: 10.0000 mg | ORAL_TABLET | Freq: Every day | ORAL | Status: DC

## 2011-06-12 MED ORDER — OMEPRAZOLE 20 MG PO CPDR: 20.0000 mg | DELAYED_RELEASE_CAPSULE | Freq: Two times a day (BID) | ORAL | Status: DC

## 2011-06-12 MED ORDER — INSULIN GLARGINE 100 UNIT/ML ~~LOC~~ SOLN: 40.0000 [IU] | Freq: Every day | SUBCUTANEOUS | Status: DC

## 2011-06-12 MED ORDER — INSULIN REGULAR HUMAN 100 UNIT/ML IJ SOLN: INTRAMUSCULAR | Status: DC

## 2011-06-12 MED ORDER — FUROSEMIDE 40 MG PO TABS: 40.0000 mg | ORAL_TABLET | Freq: Two times a day (BID) | ORAL | Status: DC

## 2011-06-12 MED ORDER — POTASSIUM CHLORIDE CRYS ER 20 MEQ PO TBCR: 20.0000 meq | EXTENDED_RELEASE_TABLET | Freq: Two times a day (BID) | ORAL | Status: DC

## 2011-06-12 MED ORDER — METOPROLOL TARTRATE 25 MG PO TABS: 25.0000 mg | ORAL_TABLET | Freq: Two times a day (BID) | ORAL | Status: DC | PRN

## 2011-06-12 MED ORDER — METFORMIN HCL 500 MG PO TABS: 500.0000 mg | ORAL_TABLET | Freq: Two times a day (BID) | ORAL | Status: DC

## 2011-06-12 MED ORDER — CEPHALEXIN 250 MG PO CAPS: 250.0000 mg | ORAL_CAPSULE | Freq: Every day | ORAL | Status: DC

## 2011-06-12 MED ORDER — HYDROCODONE-ACETAMINOPHEN 5-500 MG PO TABS: 1.0000 | ORAL_TABLET | Freq: Four times a day (QID) | ORAL | Status: DC | PRN

## 2011-06-12 MED ORDER — LEVOTHYROXINE SODIUM 150 MCG PO TABS: 150.0000 ug | ORAL_TABLET | Freq: Every day | ORAL | Status: DC

## 2011-06-12 MED ORDER — SIMVASTATIN 80 MG PO TABS: 80.0000 mg | ORAL_TABLET | Freq: Every day | ORAL | Status: DC

## 2011-06-12 MED ORDER — MECLIZINE HCL 25 MG PO TABS: 25.0000 mg | ORAL_TABLET | Freq: Every day | ORAL | Status: DC

## 2011-06-12 MED ORDER — AMLODIPINE BESYLATE 5 MG PO TABS: 5.0000 mg | ORAL_TABLET | Freq: Every day | ORAL | Status: DC

## 2011-06-12 NOTE — Assessment & Plan Note (Signed)
Blood pressure well-controlled today. We'll continue current medications. She will followup here in 2 weeks.

## 2011-06-12 NOTE — Telephone Encounter (Signed)
Message copied by Vernie Murders on Wed Jun 12, 2011  5:23 PM ------      Message from: Ronna Polio A      Created: Wed Jun 12, 2011  5:08 PM       XRAY of the knee showed severe osteoarthritis and calcification in the joint.  She needs to see orthopedics asap.  My preference Dr. Martha Clan at [facility].

## 2011-06-12 NOTE — Assessment & Plan Note (Signed)
Symptoms and exam are consistent with either severe osteoarthritis or possible meniscal tear. Will get plain x-ray of the right knee today. We'll set her up with orthopedic surgery. She will use hydrocodone as needed for severe pain. We will followup with her once results of x-ray are available.

## 2011-06-12 NOTE — Assessment & Plan Note (Signed)
Patient reports that blood sugars have been well controlled at home. Will check hemoglobin A1c with labs at next visit in 2 weeks. We'll continue current medications.

## 2011-06-12 NOTE — Telephone Encounter (Signed)
Referral entered, Patient informed  

## 2011-06-12 NOTE — Progress Notes (Signed)
Subjective:    Patient ID: [name] [name], female    DOB: 11/21/1934, 77 y.o.   MRN: [id number]  HPI 77 year old female with a history of hypertension, diabetes, hypothyroidism presents for followup after recent hospitalization. Her primary concern today is right knee pain. She notes that this weekend, she was trying to get onto a bus when she twisted her right knee resulting in severe pain. She notes that, in the past, she has been diagnosed with osteoarthritis and told that she needed a knee replacement. However, her symptoms have been manageable with use of pain medication. After this event over the weekend, she has had difficulty. Weight on her right leg. She has noticed swelling and tenderness over her right knee. She has not had any falls. She has not had any fever or chills.  In regards to her diabetes, she reports that her blood sugars have recently been well-controlled. Her fasting blood sugar this morning was 140. She reports full compliance with her medications. She denies any sugars greater than 250 or below 80.  In regards to her hypertension, she notes that she was seen by her cardiologist yesterday and her blood pressure has been well-controlled. She reports full compliance with her medication. She denies any chest pain, palpitations, or shortness of breath.  Outpatient Encounter Prescriptions as of 06/12/2011  Medication Sig Dispense Refill  . amLODipine (NORVASC) 5 MG tablet Take 1 tablet (5 mg total) by mouth daily.  90 tablet  3  . aspirin 81 MG tablet Take 162 mg by mouth daily.       . cephALEXin (KEFLEX) 250 MG capsule Take 1 capsule (250 mg total) by mouth daily.  90 capsule  3  . furosemide (LASIX) 40 MG tablet Take 1 tablet (40 mg total) by mouth 2 (two) times daily.  180 tablet  1  . HYDROcodone-acetaminophen (VICODIN) 5-500 MG per tablet Take 1 tablet by mouth every 6 (six) hours as needed.  180 tablet  2  . insulin glargine (LANTUS) 100 UNIT/ML injection Inject 40 Units  into the skin at bedtime.  30 mL  3  . insulin regular (NOVOLIN R,HUMULIN R) 100 units/mL injection Take 4 units if blood sugar is >250 before meals  30 mL  3  . isosorbide mononitrate (ISMO,MONOKET) 10 MG tablet Take 1 tablet (10 mg total) by mouth 2 (two) times daily.  90 tablet  3  . levothyroxine (LEVOTHROID) 150 MCG tablet Take 1 tablet (150 mcg total) by mouth daily.  30 tablet  11  . lisinopril (PRINIVIL,ZESTRIL) 10 MG tablet Take 1 tablet (10 mg total) by mouth daily.  90 tablet  3  . meclizine (ANTIVERT) 25 MG tablet Take 1 tablet (25 mg total) by mouth daily. As needed for dizziness or nausea  90 tablet  3  . metFORMIN (GLUCOPHAGE) 500 MG tablet Take 1 tablet (500 mg total) by mouth 2 (two) times daily.  180 tablet  3  . metoprolol tartrate (LOPRESSOR) 25 MG tablet Take 1 tablet (25 mg total) by mouth 2 (two) times daily as needed.  180 tablet  3  . nabumetone (RELAFEN) 750 MG tablet Take 1 tablet (750 mg total) by mouth daily.  90 tablet  3  . nitroGLYCERIN (NITROSTAT) 0.4 MG SL tablet Place 1 tablet (0.4 mg total) under the tongue every 5 (five) minutes as needed.  25 tablet  3  . omeprazole (PRILOSEC) 20 MG capsule Take 1 capsule (20 mg total) by mouth 2 (two) times daily.  180 capsule  3  . potassium chloride SA (K-DUR,KLOR-CON) 20 MEQ tablet Take 1 tablet (20 mEq total) by mouth 2 (two) times daily.  180 tablet  3  . simvastatin (ZOCOR) 80 MG tablet Take 1 tablet (80 mg total) by mouth at bedtime.  90 tablet  3    Review of Systems  Constitutional: Negative for fever, chills and diaphoresis.  Respiratory: Negative for cough and shortness of breath.   Cardiovascular: Negative for chest pain, palpitations and leg swelling.  Gastrointestinal: Negative for abdominal pain.  Musculoskeletal: Positive for myalgias, back pain, joint swelling and arthralgias.   BP 130/60  Pulse 62  Temp(Src) 98 F (36.7 C) (Oral)  Ht 5\' 3"  (1.6 m)  Wt 224 lb (101.606 kg)  BMI 39.68 kg/m2  SpO2  96%     Objective:   Physical Exam  Constitutional: She is oriented to person, place, and time. She appears well-developed and well-nourished. No distress.  HENT:  Head: Normocephalic and atraumatic.  Right Ear: External ear normal.  Left Ear: External ear normal.  Nose: Nose normal.  Mouth/Throat: Oropharynx is clear and moist. No oropharyngeal exudate.  Eyes: Conjunctivae are normal. Pupils are equal, round, and reactive to light. Right eye exhibits no discharge. Left eye exhibits no discharge. No scleral icterus.  Neck: Normal range of motion. Neck supple. No tracheal deviation present. No thyromegaly present.  Cardiovascular: Normal rate, regular rhythm, normal heart sounds and intact distal pulses.  Exam reveals no gallop and no friction rub.   No murmur heard. Pulmonary/Chest: Effort normal and breath sounds normal. No respiratory distress. She has no wheezes. She has no rales. She exhibits no tenderness.  Musculoskeletal: She exhibits no edema and no tenderness.       Right knee: She exhibits decreased range of motion, swelling and bony tenderness.  Lymphadenopathy:    She has no cervical adenopathy.  Neurological: She is alert and oriented to person, place, and time. No cranial nerve deficit. She exhibits normal muscle tone. Coordination normal.  Skin: Skin is warm and dry. No rash noted. She is not diaphoretic. No erythema. No pallor.  Psychiatric: She has a normal mood and affect. Her behavior is normal. Judgment and thought content normal.          Assessment & Plan:

## 2011-06-14 ENCOUNTER — Other Ambulatory Visit: Payer: Self-pay | Admitting: *Deleted

## 2011-06-14 DIAGNOSIS — N390 Urinary tract infection, site not specified: Secondary | ICD-10-CM

## 2011-06-14 MED ORDER — CEPHALEXIN 250 MG PO CAPS: 250.0000 mg | ORAL_CAPSULE | Freq: Every day | ORAL | Status: DC

## 2011-07-02 ENCOUNTER — Ambulatory Visit: Payer: Medicare PPO | Admitting: Internal Medicine

## 2011-07-03 ENCOUNTER — Other Ambulatory Visit: Payer: Self-pay | Admitting: *Deleted

## 2011-07-03 MED ORDER — SIMVASTATIN 40 MG PO TABS: 40.0000 mg | ORAL_TABLET | Freq: Every day | ORAL | Status: DC

## 2011-07-23 ENCOUNTER — Encounter: Payer: Self-pay | Admitting: Internal Medicine

## 2011-08-21 ENCOUNTER — Ambulatory Visit (INDEPENDENT_AMBULATORY_CARE_PROVIDER_SITE_OTHER): Payer: Medicare Other | Admitting: Internal Medicine

## 2011-08-21 ENCOUNTER — Encounter: Payer: Self-pay | Admitting: Internal Medicine

## 2011-08-21 DIAGNOSIS — M069 Rheumatoid arthritis, unspecified: Secondary | ICD-10-CM

## 2011-08-21 DIAGNOSIS — M1990 Unspecified osteoarthritis, unspecified site: Secondary | ICD-10-CM

## 2011-08-21 DIAGNOSIS — M62838 Other muscle spasm: Secondary | ICD-10-CM

## 2011-08-21 DIAGNOSIS — E119 Type 2 diabetes mellitus without complications: Secondary | ICD-10-CM

## 2011-08-21 LAB — COMPREHENSIVE METABOLIC PANEL
ALT: 13 U/L (ref 0–35)
AST: 17 U/L (ref 0–37)
Albumin: 4 g/dL (ref 3.5–5.2)
Alkaline Phosphatase: 65 U/L (ref 39–117)
BUN: 22 mg/dL (ref 6–23)
Potassium: 4.3 mEq/L (ref 3.5–5.1)
Sodium: 142 mEq/L (ref 135–145)

## 2011-08-21 LAB — HEMOGLOBIN A1C: Hgb A1c MFr Bld: 7.9 % — ABNORMAL HIGH (ref 4.6–6.5)

## 2011-08-21 MED ORDER — CYCLOBENZAPRINE HCL 5 MG PO TABS: 5.0000 mg | ORAL_TABLET | Freq: Three times a day (TID) | ORAL | Status: AC | PRN

## 2011-08-21 MED ORDER — CYCLOBENZAPRINE HCL 5 MG PO TABS: 5.0000 mg | ORAL_TABLET | Freq: Three times a day (TID) | ORAL | Status: DC | PRN

## 2011-08-21 NOTE — Assessment & Plan Note (Signed)
Symptoms gradually worsening. Patient would likely benefit from bilateral knee replacement. Her other medical conditions are currently stable. Will send letter to her orthopedic surgeon to this effect.

## 2011-08-21 NOTE — Progress Notes (Signed)
Subjective:    Patient ID: [name] [name], female    DOB: 1934/07/04, 77 y.o.   MRN: [id number]  HPI 77 year old female with history of diabetes and osteoarthritis presents for followup. She is concerned about persistent bilateral knee pain, worse in her right knee versus left. She notes that she was evaluated by orthopedic surgeon and had an injection which she did not find helpful. She is using hydrocodone for severe pain at night with some improvement. She questions whether she might benefit from knee replacement. Pain is worse with persistent standing or movement.  In regards to her diabetes, she notes that her blood sugars have been well-controlled. Average blood sugar over the last 2 weeks was 111 and over the last month was 149. She reports full compliance with her medications. She notes improved efforts at diet and notes that her clothes are fitting better.  Patient is also concerned today about several week history of cramping in her right neck. She denies any known trauma to her neck. She does note some spasm of the muscle over her right neck. She has not taking any medication for this.   Outpatient Encounter Prescriptions as of 08/21/2011  Medication Sig Dispense Refill  . amLODipine (NORVASC) 5 MG tablet Take 1 tablet (5 mg total) by mouth daily.  90 tablet  3  . aspirin 81 MG tablet Take 162 mg by mouth daily.       . cephALEXin (KEFLEX) 250 MG capsule Take 1 capsule (250 mg total) by mouth daily.  90 capsule  3  . furosemide (LASIX) 40 MG tablet Take 1 tablet (40 mg total) by mouth 2 (two) times daily.  180 tablet  1  . HYDROcodone-acetaminophen (VICODIN) 5-500 MG per tablet Take 1 tablet by mouth every 6 (six) hours as needed.  180 tablet  2  . insulin glargine (LANTUS) 100 UNIT/ML injection Inject 40 Units into the skin at bedtime.  30 mL  3  . insulin regular (NOVOLIN R,HUMULIN R) 100 units/mL injection Take 4 units if blood sugar is >250 before meals  30 mL  3  . isosorbide  mononitrate (ISMO,MONOKET) 10 MG tablet Take 1 tablet (10 mg total) by mouth 2 (two) times daily.  90 tablet  3  . levothyroxine (LEVOTHROID) 150 MCG tablet Take 1 tablet (150 mcg total) by mouth daily.  30 tablet  11  . lisinopril (PRINIVIL,ZESTRIL) 10 MG tablet Take 1 tablet (10 mg total) by mouth daily.  90 tablet  3  . meclizine (ANTIVERT) 25 MG tablet Take 1 tablet (25 mg total) by mouth daily. As needed for dizziness or nausea  90 tablet  3  . metFORMIN (GLUCOPHAGE) 500 MG tablet Take 1 tablet (500 mg total) by mouth 2 (two) times daily.  180 tablet  3  . metoprolol tartrate (LOPRESSOR) 25 MG tablet Take 1 tablet (25 mg total) by mouth 2 (two) times daily as needed.  180 tablet  3  . nabumetone (RELAFEN) 750 MG tablet Take 1 tablet (750 mg total) by mouth daily.  90 tablet  3  . nitroGLYCERIN (NITROSTAT) 0.4 MG SL tablet Place 1 tablet (0.4 mg total) under the tongue every 5 (five) minutes as needed.  25 tablet  3  . omeprazole (PRILOSEC) 20 MG capsule Take 1 capsule (20 mg total) by mouth 2 (two) times daily.  180 capsule  3  . potassium chloride SA (K-DUR,KLOR-CON) 20 MEQ tablet Take 1 tablet (20 mEq total) by mouth 2 (two) times daily.  180 tablet  3  . simvastatin (ZOCOR) 40 MG tablet Take 1 tablet (40 mg total) by mouth at bedtime.  90 tablet  3  . cyclobenzaprine (FLEXERIL) 5 MG tablet Take 1 tablet (5 mg total) by mouth 3 (three) times daily as needed for muscle spasms.  30 tablet  1  . DISCONTD: cyclobenzaprine (FLEXERIL) 5 MG tablet Take 1 tablet (5 mg total) by mouth 3 (three) times daily as needed for muscle spasms.  30 tablet  1   BP 132/58  Pulse 67  Temp(Src) 97.7 F (36.5 C) (Oral)  Ht 5\' 3"  (1.6 m)  Wt 223 lb (101.152 kg)  BMI 39.50 kg/m2  SpO2 96%  Review of Systems  Constitutional: Negative for fever, chills, appetite change, fatigue and unexpected weight change.  HENT: Negative for ear pain, congestion, sore throat, trouble swallowing, neck pain, voice change and  sinus pressure.   Eyes: Negative for visual disturbance.  Respiratory: Negative for cough, shortness of breath, wheezing and stridor.   Cardiovascular: Negative for chest pain, palpitations and leg swelling.  Gastrointestinal: Negative for nausea, vomiting, abdominal pain, diarrhea, constipation, blood in stool, abdominal distention and anal bleeding.  Genitourinary: Negative for dysuria and flank pain.  Musculoskeletal: Positive for myalgias, joint swelling and arthralgias. Negative for gait problem.  Skin: Negative for color change and rash.  Neurological: Negative for dizziness and headaches.  Hematological: Negative for adenopathy. Does not bruise/bleed easily.  Psychiatric/Behavioral: Negative for suicidal ideas, sleep disturbance and dysphoric mood. The patient is not nervous/anxious.        Objective:   Physical Exam  Constitutional: She is oriented to person, place, and time. She appears well-developed and well-nourished. No distress.  HENT:  Head: Normocephalic and atraumatic.  Right Ear: External ear normal.  Left Ear: External ear normal.  Nose: Nose normal.  Mouth/Throat: Oropharynx is clear and moist. No oropharyngeal exudate.  Eyes: Conjunctivae are normal. Pupils are equal, round, and reactive to light. Right eye exhibits no discharge. Left eye exhibits no discharge. No scleral icterus.  Neck: Normal range of motion. Neck supple. No tracheal deviation present. No thyromegaly present.  Cardiovascular: Normal rate, regular rhythm, normal heart sounds and intact distal pulses.  Exam reveals no gallop and no friction rub.   No murmur heard. Pulmonary/Chest: Effort normal and breath sounds normal. No respiratory distress. She has no wheezes. She has no rales. She exhibits no tenderness.  Musculoskeletal: She exhibits no edema and no tenderness.       Right knee: She exhibits decreased range of motion and swelling.       Left knee: She exhibits decreased range of motion and  swelling.       Cervical back: She exhibits pain and spasm.  Lymphadenopathy:    She has no cervical adenopathy.  Neurological: She is alert and oriented to person, place, and time. No cranial nerve deficit. She exhibits normal muscle tone. Coordination normal.  Skin: Skin is warm and dry. No rash noted. She is not diaphoretic. No erythema. No pallor.  Psychiatric: She has a normal mood and affect. Her behavior is normal. Judgment and thought content normal.          Assessment & Plan:

## 2011-08-21 NOTE — Assessment & Plan Note (Signed)
Will try adding Flexeril to see if any improvement. Followup in one month.

## 2011-08-21 NOTE — Assessment & Plan Note (Signed)
Patient reports good control of blood sugars. Will check hemoglobin A1c with labs. We'll continue current medications.

## 2011-10-01 ENCOUNTER — Telehealth: Payer: Self-pay | Admitting: Internal Medicine

## 2011-10-01 NOTE — Telephone Encounter (Signed)
Patient waiting on scheduling to be done for TKR surgery; informed pt that we made referral to Dr. Martha Clan office at [facility] has already had an OV], and that she needs to contact surgeon's office to get this information. Patient understood & agreed, and will have [facility] office fax any necessary paperwork needed from us/[facility]

## 2011-10-01 NOTE — Telephone Encounter (Signed)
Patient needs to talk with the doctor about her knee replacement surgery.

## 2011-10-17 ENCOUNTER — Encounter: Payer: Self-pay | Admitting: Internal Medicine

## 2011-10-17 ENCOUNTER — Ambulatory Visit (INDEPENDENT_AMBULATORY_CARE_PROVIDER_SITE_OTHER): Payer: Medicare Other | Admitting: Internal Medicine

## 2011-10-17 DIAGNOSIS — E669 Obesity, unspecified: Secondary | ICD-10-CM

## 2011-10-17 DIAGNOSIS — IMO0001 Reserved for inherently not codable concepts without codable children: Secondary | ICD-10-CM

## 2011-10-17 DIAGNOSIS — M1990 Unspecified osteoarthritis, unspecified site: Secondary | ICD-10-CM

## 2011-10-17 DIAGNOSIS — D649 Anemia, unspecified: Secondary | ICD-10-CM

## 2011-10-17 DIAGNOSIS — I2510 Atherosclerotic heart disease of native coronary artery without angina pectoris: Secondary | ICD-10-CM

## 2011-10-17 DIAGNOSIS — E1169 Type 2 diabetes mellitus with other specified complication: Secondary | ICD-10-CM

## 2011-10-17 DIAGNOSIS — E039 Hypothyroidism, unspecified: Secondary | ICD-10-CM

## 2011-10-17 LAB — CBC WITH DIFFERENTIAL/PLATELET
Basophils Relative: 0.4 % (ref 0.0–3.0)
Eosinophils Absolute: 0.2 10*3/uL (ref 0.0–0.7)
HCT: 39.3 % (ref 36.0–46.0)
Hemoglobin: 13 g/dL (ref 12.0–15.0)
Lymphocytes Relative: 29 % (ref 12.0–46.0)
Lymphs Abs: 2.4 10*3/uL (ref 0.7–4.0)
MCHC: 33.1 g/dL (ref 30.0–36.0)
MCV: 92.7 fl (ref 78.0–100.0)
Monocytes Absolute: 0.5 10*3/uL (ref 0.1–1.0)
Neutro Abs: 5.1 10*3/uL (ref 1.4–7.7)
RBC: 4.24 Mil/uL (ref 3.87–5.11)

## 2011-10-17 LAB — COMPREHENSIVE METABOLIC PANEL
ALT: 11 U/L (ref 0–35)
BUN: 24 mg/dL — ABNORMAL HIGH (ref 6–23)
CO2: 28 mEq/L (ref 19–32)
Chloride: 104 mEq/L (ref 96–112)
Glucose, Bld: 93 mg/dL (ref 70–99)
Potassium: 3.7 mEq/L (ref 3.5–5.1)
Total Protein: 7.4 g/dL (ref 6.0–8.3)

## 2011-10-17 NOTE — Assessment & Plan Note (Signed)
Scheduled for right knee replacement. As above, will set up cardiology evaluation for risk stratification prior to procedure.

## 2011-10-17 NOTE — Assessment & Plan Note (Signed)
Symptoms have been relatively well controlled, however she does occasionally have brief episodes of chest pain. We'll move up her cardiology followup to this month for risk stratification prior to orthopedic surgery.

## 2011-10-17 NOTE — Progress Notes (Signed)
Subjective:    Patient ID: [name] [name], female    DOB: 06/14/1934, 77 y.o.   MRN: [id number]  HPI 77 year old female with history of diabetes, coronary artery disease, and osteoarthritis presents for surgical clearance prior to right knee replacement. She reports that she is generally feeling well. In regards to her coronary artery disease, she does continue to have occasional episodes of brief left-sided chest pain. This resolves without any intervention. It occurs both at rest and on exertion. She denies any palpitations, shortness of breath, or nausea. She reports full compliance with her medications.  In regards to her diabetes, she reports that blood sugars have been mostly below 200. She did not bring her glucometer today. She reports full compliance with her insulin.  In regards to osteoarthritis, she reports severe right knee pain, particularly with ambulation. She reports that plan is for total knee replacement on the right.  She has never had problems with anesthesia in the past. She has no history of easy bleeding or easy bruising.  Outpatient Encounter Prescriptions as of 10/17/2011  Medication Sig Dispense Refill  . amLODipine (NORVASC) 5 MG tablet Take 1 tablet (5 mg total) by mouth daily.  90 tablet  3  . aspirin 81 MG tablet Take 162 mg by mouth daily.       . cephALEXin (KEFLEX) 250 MG capsule Take 1 capsule (250 mg total) by mouth daily.  90 capsule  3  . furosemide (LASIX) 40 MG tablet Take 1 tablet (40 mg total) by mouth 2 (two) times daily.  180 tablet  1  . HYDROcodone-acetaminophen (VICODIN) 5-500 MG per tablet Take 1 tablet by mouth every 6 (six) hours as needed.  180 tablet  2  . insulin glargine (LANTUS) 100 UNIT/ML injection Inject 40 Units into the skin at bedtime.  30 mL  3  . insulin regular (NOVOLIN R,HUMULIN R) 100 units/mL injection Take 4 units if blood sugar is >250 before meals  30 mL  3  . isosorbide mononitrate (ISMO,MONOKET) 10 MG tablet Take 1 tablet  (10 mg total) by mouth 2 (two) times daily.  90 tablet  3  . levothyroxine (LEVOTHROID) 150 MCG tablet Take 1 tablet (150 mcg total) by mouth daily.  30 tablet  11  . lisinopril (PRINIVIL,ZESTRIL) 10 MG tablet Take 1 tablet (10 mg total) by mouth daily.  90 tablet  3  . meclizine (ANTIVERT) 25 MG tablet Take 1 tablet (25 mg total) by mouth daily. As needed for dizziness or nausea  90 tablet  3  . metFORMIN (GLUCOPHAGE) 500 MG tablet Take 1 tablet (500 mg total) by mouth 2 (two) times daily.  180 tablet  3  . metoprolol tartrate (LOPRESSOR) 25 MG tablet Take 1 tablet (25 mg total) by mouth 2 (two) times daily as needed.  180 tablet  3  . nabumetone (RELAFEN) 750 MG tablet Take 1 tablet (750 mg total) by mouth daily.  90 tablet  3  . nitroGLYCERIN (NITROSTAT) 0.4 MG SL tablet Place 1 tablet (0.4 mg total) under the tongue every 5 (five) minutes as needed.  25 tablet  3  . omeprazole (PRILOSEC) 20 MG capsule Take 1 capsule (20 mg total) by mouth 2 (two) times daily.  180 capsule  3  . potassium chloride SA (K-DUR,KLOR-CON) 20 MEQ tablet Take 1 tablet (20 mEq total) by mouth 2 (two) times daily.  180 tablet  3  . simvastatin (ZOCOR) 40 MG tablet Take 1 tablet (40 mg total) by  mouth at bedtime.  90 tablet  3   BP 140/62  Pulse 71  Temp(Src) 98.3 F (36.8 C) (Oral)  Ht 5\' 3"  (1.6 m)  Wt 213 lb 12 oz (96.956 kg)  BMI 37.86 kg/m2  Review of Systems  Constitutional: Negative for fever, chills, appetite change, fatigue and unexpected weight change.  HENT: Negative for ear pain, congestion, sore throat, trouble swallowing, neck pain, voice change and sinus pressure.   Eyes: Negative for visual disturbance.  Respiratory: Negative for cough, shortness of breath, wheezing and stridor.   Cardiovascular: Positive for chest pain. Negative for palpitations and leg swelling.  Gastrointestinal: Negative for nausea, vomiting, abdominal pain, diarrhea, constipation, blood in stool, abdominal distention and anal  bleeding.  Genitourinary: Negative for dysuria and flank pain.  Musculoskeletal: Positive for back pain, arthralgias and gait problem. Negative for myalgias.  Skin: Negative for color change and rash.  Neurological: Negative for dizziness and headaches.  Hematological: Negative for adenopathy. Does not bruise/bleed easily.  Psychiatric/Behavioral: Negative for suicidal ideas, sleep disturbance and dysphoric mood. The patient is not nervous/anxious.        Objective:   Physical Exam  Constitutional: She is oriented to person, place, and time. She appears well-developed and well-nourished. No distress.  HENT:  Head: Normocephalic and atraumatic.  Right Ear: External ear normal.  Left Ear: External ear normal.  Nose: Nose normal.  Mouth/Throat: Oropharynx is clear and moist. No oropharyngeal exudate.  Eyes: Conjunctivae are normal. Pupils are equal, round, and reactive to light. Right eye exhibits no discharge. Left eye exhibits no discharge. No scleral icterus.  Neck: Normal range of motion. Neck supple. No tracheal deviation present. No thyromegaly present.  Cardiovascular: Normal rate, regular rhythm, normal heart sounds and intact distal pulses.  Exam reveals no gallop and no friction rub.   No murmur heard. Pulmonary/Chest: Effort normal and breath sounds normal. No respiratory distress. She has no wheezes. She has no rales. She exhibits no tenderness.  Musculoskeletal: She exhibits no edema and no tenderness.       Right knee: She exhibits decreased range of motion and swelling.  Lymphadenopathy:    She has no cervical adenopathy.  Neurological: She is alert and oriented to person, place, and time. No cranial nerve deficit. She exhibits normal muscle tone. Coordination normal.  Skin: Skin is warm and dry. No rash noted. She is not diaphoretic. No erythema. No pallor.  Psychiatric: She has a normal mood and affect. Her behavior is normal. Judgment and thought content normal.           Assessment & Plan:

## 2011-10-17 NOTE — Assessment & Plan Note (Signed)
Patient reports good control of blood sugars. Will plan to repeat hemoglobin A1c today. We'll continue current medications. She will followup in 3 months.

## 2011-10-17 NOTE — Assessment & Plan Note (Signed)
Congratulated patient on 10 pound weight loss. Encouraged her to continue with efforts at healthy diet and regular physical activity.

## 2011-10-18 ENCOUNTER — Other Ambulatory Visit: Payer: Self-pay | Admitting: *Deleted

## 2011-10-18 DIAGNOSIS — I10 Essential (primary) hypertension: Secondary | ICD-10-CM

## 2011-10-18 NOTE — Telephone Encounter (Signed)
Opened in error

## 2011-10-29 ENCOUNTER — Encounter: Payer: Self-pay | Admitting: Cardiovascular Disease

## 2011-10-29 ENCOUNTER — Ambulatory Visit (INDEPENDENT_AMBULATORY_CARE_PROVIDER_SITE_OTHER): Payer: Medicare Other | Admitting: Cardiovascular Disease

## 2011-10-29 VITALS — BP 166/64 | HR 71 | Ht 63.0 in | Wt 227.0 lb

## 2011-10-29 DIAGNOSIS — E119 Type 2 diabetes mellitus without complications: Secondary | ICD-10-CM

## 2011-10-29 DIAGNOSIS — E1169 Type 2 diabetes mellitus with other specified complication: Secondary | ICD-10-CM

## 2011-10-29 DIAGNOSIS — E785 Hyperlipidemia, unspecified: Secondary | ICD-10-CM

## 2011-10-29 DIAGNOSIS — E669 Obesity, unspecified: Secondary | ICD-10-CM

## 2011-10-29 DIAGNOSIS — I10 Essential (primary) hypertension: Secondary | ICD-10-CM

## 2011-10-29 DIAGNOSIS — I2510 Atherosclerotic heart disease of native coronary artery without angina pectoris: Secondary | ICD-10-CM

## 2011-10-29 DIAGNOSIS — I4892 Unspecified atrial flutter: Secondary | ICD-10-CM

## 2011-10-29 DIAGNOSIS — Z01810 Encounter for preprocedural cardiovascular examination: Secondary | ICD-10-CM

## 2011-10-29 MED ORDER — AMLODIPINE BESYLATE 10 MG PO TABS: 10.0000 mg | ORAL_TABLET | Freq: Every day | ORAL | Status: DC

## 2011-10-29 MED ORDER — LISINOPRIL 10 MG PO TABS: 10.0000 mg | ORAL_TABLET | Freq: Every day | ORAL | Status: DC

## 2011-10-29 MED ORDER — ISOSORBIDE MONONITRATE 10 MG PO TABS: 10.0000 mg | ORAL_TABLET | Freq: Two times a day (BID) | ORAL | Status: DC

## 2011-10-29 MED ORDER — FUROSEMIDE 40 MG PO TABS: 40.0000 mg | ORAL_TABLET | Freq: Two times a day (BID) | ORAL | Status: DC

## 2011-10-29 MED ORDER — METOPROLOL TARTRATE 25 MG PO TABS: 25.0000 mg | ORAL_TABLET | Freq: Two times a day (BID) | ORAL | Status: DC

## 2011-10-29 NOTE — Assessment & Plan Note (Signed)
We have encouraged continued careful diet management in an effort to lose weight.  

## 2011-10-29 NOTE — Assessment & Plan Note (Signed)
We have asked her to watch her blood pressure at home. If it continues to be mildly elevated, she could increase the amlodipine to 10 mg daily or the lisinopril to 20 mg daily. She does report adequate blood pressure at home.

## 2011-10-29 NOTE — Assessment & Plan Note (Signed)
Currently with no symptoms of angina. No further workup at this time. Continue current medication regimen. Negative stress test last year.

## 2011-10-29 NOTE — Progress Notes (Signed)
Patient ID: [name] [name], female    DOB: 19-Oct-1934, 77 y.o.   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  Previous episode of chest pain in 2012 and She was sent over to the hospital, workup including cardiac enzymes and a stress test. Stress test showed no ischemia and she was discharged home. She has not had any significant chest pain since that time. She continues to have  problems with her knee. She uses a cane for support . She continues to have problems with her weight.  She denies  lightheadedness or dizziness. She has a relatively high level of activity as she has to maintain her house. She lives by herself. 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 Weight is down 10 pounds from prior visits by her records. She is drinking Atkins shakes.  EKG shows normal sinus rhythm ,  rate of 71 beats per minute , no significant ST-T wave changes       Outpatient Encounter Prescriptions as of 10/29/2011  Medication Sig Dispense Refill  . amLODipine (NORVASC) 10 MG tablet Take 1 tablet (10 mg total) by mouth daily.  30 tablet  6  . aspirin 81 MG tablet Take 162 mg by mouth daily.       . cephALEXin (KEFLEX) 250 MG capsule Take 250 mg by mouth daily as needed.      . cyclobenzaprine (FLEXERIL) 5 MG tablet Take 5 mg by mouth 3 (three) times daily as needed.      . furosemide (LASIX) 40 MG tablet Take 1 tablet (40 mg total) by mouth 2 (two) times daily.  60 tablet  6  . HYDROcodone-acetaminophen (VICODIN) 5-500 MG per tablet Take 1 tablet by mouth every 6 (six) hours as needed.  180 tablet  2  . insulin glargine (LANTUS) 100 UNIT/ML injection Inject 40 Units into the skin at bedtime.  30 mL  3  . insulin regular (NOVOLIN R,HUMULIN R) 100 units/mL injection Take 4 units if blood sugar is >250 before meals  30 mL  3  .  isosorbide mononitrate (ISMO,MONOKET) 10 MG tablet Take 1 tablet (10 mg total) by mouth 2 (two) times daily.  60 tablet  6  . levothyroxine (LEVOTHROID) 150 MCG tablet Take 1 tablet (150 mcg total) by mouth daily.  30 tablet  11  . lisinopril (PRINIVIL,ZESTRIL) 10 MG tablet Take 1 tablet (10 mg total) by mouth daily.  30 tablet  6  . meclizine (ANTIVERT) 25 MG tablet Take 1 tablet (25 mg total) by mouth daily. As needed for dizziness or nausea  90 tablet  3  . metFORMIN (GLUCOPHAGE) 500 MG tablet Take 1 tablet (500 mg total) by mouth 2 (two) times daily.  180 tablet  3  . metoprolol tartrate (LOPRESSOR) 25 MG tablet Take 1 tablet (25 mg total) by mouth 2 (two) times daily.  60 tablet  6  . nabumetone (RELAFEN) 750 MG tablet Take 1 tablet (750 mg total) by mouth daily.  90 tablet  3  . nitroGLYCERIN (NITROSTAT) 0.4 MG SL tablet Place 1 tablet (0.4 mg total) under the tongue every 5 (five) minutes as needed.  25 tablet  3  . omeprazole (PRILOSEC) 20 MG capsule Take 1 capsule (20 mg total) by mouth 2 (two) times daily.  180 capsule  3  . potassium  chloride SA (K-DUR,KLOR-CON) 20 MEQ tablet Take 1 tablet (20 mEq total) by mouth 2 (two) times daily.  180 tablet  3  . simvastatin (ZOCOR) 40 MG tablet Take 1 tablet (40 mg total) by mouth at bedtime.  90 tablet  3   Review of Systems  Constitutional: Negative.   HENT: Negative.   Eyes: Negative.   Respiratory: Negative.   Cardiovascular: Negative.   Gastrointestinal: Negative.   Musculoskeletal: Positive for joint swelling, arthralgias and gait problem.  Skin: Negative.   Neurological: Negative.   Hematological: Negative.   Psychiatric/Behavioral: Negative.   [name] other systems reviewed and are negative.    BP 166/64  Pulse 71  Ht 5\' 3"  (1.6 m)  Wt 227 lb (102.967 kg)  BMI 40.21 kg/m2 She reports blood pressure at home has been in the 130-140 range Physical Exam  Nursing note and vitals reviewed. Constitutional: She is oriented to person,  place, and time. She appears well-developed and well-nourished.  HENT:  Head: Normocephalic.  Nose: Nose normal.  Mouth/Throat: Oropharynx is clear and moist.  Eyes: Conjunctivae are normal. Pupils are equal, round, and reactive to light.  Neck: Normal range of motion. Neck supple. No JVD present.  Cardiovascular: Normal rate, regular rhythm, S1 normal, S2 normal, normal heart sounds and intact distal pulses.  Exam reveals no gallop and no friction rub.   No murmur heard. Pulmonary/Chest: Effort normal and breath sounds normal. No respiratory distress. She has no wheezes. She has no rales. She exhibits no tenderness.  Abdominal: Soft. Bowel sounds are normal. She exhibits no distension. There is no tenderness.  Musculoskeletal: Normal range of motion. She exhibits no edema and no tenderness.  Lymphadenopathy:    She has no cervical adenopathy.  Neurological: She is alert and oriented to person, place, and time. Coordination normal.  Skin: Skin is warm and dry. No rash noted. No erythema.  Psychiatric: She has a normal mood and affect. Her behavior is normal. Judgment and thought content normal.         Assessment and Plan

## 2011-10-29 NOTE — Assessment & Plan Note (Signed)
Cholesterol is at goal on the current lipid regimen (labs from several years ago). No changes to the medications were made.

## 2011-10-29 NOTE — Assessment & Plan Note (Signed)
No recent shortness of breath or chest pain episodes. Negative stress test last year. She would be acceptable risk for upcoming total knee replacement. No further testing needed.

## 2011-10-29 NOTE — Patient Instructions (Signed)
You are doing well. No medication changes were made.  If your blood pressure is running high, take extra lisinopril or extra amlodipine  Please call us if you have new issues that need to be addressed before your next appt.  Your physician wants you to follow-up in: 6 months.  You will receive a reminder letter in the mail two months in advance. If you don't receive a letter, please call our office to schedule the follow-up appointment.

## 2011-11-01 ENCOUNTER — Telehealth: Payer: Self-pay | Admitting: Internal Medicine

## 2011-11-01 NOTE — Telephone Encounter (Signed)
Pt wanted to know if you could make her appointment with dr Gillermina Hu after 6/16

## 2011-11-04 ENCOUNTER — Telehealth: Payer: Self-pay | Admitting: Internal Medicine

## 2011-11-04 MED ORDER — HYDROCODONE-ACETAMINOPHEN 5-500 MG PO TABS: 1.0000 | ORAL_TABLET | Freq: Three times a day (TID) | ORAL | Status: DC | PRN

## 2011-11-04 NOTE — Telephone Encounter (Signed)
Rx called to [facility], patient advised as instructed via telephone. 

## 2011-11-04 NOTE — Telephone Encounter (Signed)
Patient called in and would like something called in for knee pain, she states you normally call in hydrocodone, however she is out of them.  Please advise

## 2011-11-04 NOTE — Telephone Encounter (Signed)
Fine to call in Hydrocodone 5-500mg  po tid prn pain, #60. No refill.

## 2011-11-14 NOTE — Telephone Encounter (Signed)
Can you do this? I think she means Dr. Merlyn Albert

## 2011-11-18 ENCOUNTER — Ambulatory Visit: Payer: Medicare Other | Admitting: Cardiovascular Disease

## 2011-11-20 ENCOUNTER — Ambulatory Visit: Payer: Medicare Other | Admitting: Internal Medicine

## 2011-11-26 ENCOUNTER — Telehealth: Payer: Self-pay | Admitting: Cardiovascular Disease

## 2011-11-26 NOTE — Telephone Encounter (Signed)
After reviewing Dr. Windell Hummingbird last office note, I faxed surgical clearance form to Dr. Martha Clan.

## 2011-11-26 NOTE — Telephone Encounter (Signed)
Pt needs total knee replacement and need clearance. Surgery set for August 21st. Is pt ok to have surgery? Fax [phone] clearance letter Attn: Delaney Meigs Dr Martha Clan.

## 2011-12-05 ENCOUNTER — Other Ambulatory Visit: Payer: Self-pay | Admitting: *Deleted

## 2011-12-05 MED ORDER — HYDROCODONE-ACETAMINOPHEN 5-500 MG PO TABS: 1.0000 | ORAL_TABLET | Freq: Three times a day (TID) | ORAL | Status: DC | PRN

## 2011-12-05 NOTE — Telephone Encounter (Signed)
Rx called to Casimiro Needle at [facility].

## 2011-12-31 ENCOUNTER — Ambulatory Visit: Payer: Self-pay | Admitting: Orthopedic Surgery

## 2011-12-31 LAB — URINALYSIS, COMPLETE
Bilirubin,UR: NEGATIVE
Glucose,UR: NEGATIVE mg/dL (ref 0–75)
Ph: 5 (ref 4.5–8.0)
Squamous Epithelial: NONE SEEN

## 2011-12-31 LAB — BASIC METABOLIC PANEL
Anion Gap: 5 — ABNORMAL LOW (ref 7–16)
BUN: 16 mg/dL (ref 7–18)
Co2: 31 mmol/L (ref 21–32)
Creatinine: 0.79 mg/dL (ref 0.60–1.30)
Osmolality: 287 (ref 275–301)

## 2011-12-31 LAB — CBC
HCT: 37.6 % (ref 35.0–47.0)
HGB: 13 g/dL (ref 12.0–16.0)
MCH: 30.5 pg (ref 26.0–34.0)
MCHC: 34.4 g/dL (ref 32.0–36.0)
MCV: 89 fL (ref 80–100)
Platelet: 282 10*3/uL (ref 150–440)

## 2011-12-31 LAB — PROTIME-INR
INR: 0.9
Prothrombin Time: 12.1 secs (ref 11.5–14.7)

## 2012-01-06 ENCOUNTER — Telehealth: Payer: Self-pay | Admitting: Internal Medicine

## 2012-01-06 NOTE — Telephone Encounter (Signed)
Looks like patient was treated for an UTI by Dr. Dan Humphreys on 06/12/2011 and was given Cephalexin, patient should have completed this medication and should no longer be on it.  We haven't seen patient since 05/2011.  Called and left a message for Joni to return call.

## 2012-01-06 NOTE — Telephone Encounter (Signed)
[name] [name] @ DR Rico Ala called  [name] [name] had labs there and it looked like she had UTI   Pt stated she was taking cephalexin 250mg  daily Dr Rico Ala wanted to know why she was taking this for uti??  Pt is having total knee replacement on Wednesday Please call [name] [name]

## 2012-01-06 NOTE — Telephone Encounter (Signed)
Joni called back stating that patient was told to continue taking Cephalexin daily.  Does she need to be on this medicine for prophylactic treatment?  Please advise.

## 2012-01-06 NOTE — Telephone Encounter (Signed)
Patient advised as instructed via telephone, she stated that she is waiting to hear back from the doctors office that will be doing her surgery.  I will call Joni back tomorrow and advise her as well.

## 2012-01-06 NOTE — Telephone Encounter (Signed)
I would recommend stopping medication, as low dose antibiotics usually leads to resistance.

## 2012-01-07 NOTE — Telephone Encounter (Signed)
Spoke with Joni with [facility] and she stated that patient only has about six tablets of Keflex left and they advised her to finish what she has.

## 2012-01-08 ENCOUNTER — Inpatient Hospital Stay: Payer: Self-pay | Admitting: Orthopedic Surgery

## 2012-01-08 LAB — URINALYSIS, COMPLETE
Blood: NEGATIVE
Glucose,UR: >=500 mg/dL (ref 0–75)
Hyaline Cast: 4
Nitrite: NEGATIVE
Protein: 100
RBC,UR: 3 /HPF (ref 0–5)
Squamous Epithelial: 1

## 2012-01-09 LAB — BASIC METABOLIC PANEL
Anion Gap: 6 — ABNORMAL LOW (ref 7–16)
Calcium, Total: 8.6 mg/dL (ref 8.5–10.1)
Co2: 28 mmol/L (ref 21–32)
Creatinine: 0.82 mg/dL (ref 0.60–1.30)
Glucose: 200 mg/dL — ABNORMAL HIGH (ref 65–99)
Sodium: 137 mmol/L (ref 136–145)

## 2012-01-09 LAB — CBC WITH DIFFERENTIAL/PLATELET
Basophil %: 0.3 %
Eosinophil %: 0.2 %
HCT: 30.9 % — ABNORMAL LOW (ref 35.0–47.0)
HGB: 10.6 g/dL — ABNORMAL LOW (ref 12.0–16.0)
MCHC: 34.3 g/dL (ref 32.0–36.0)
MCV: 90 fL (ref 80–100)
Monocyte #: 1.5 x10 3/mm — ABNORMAL HIGH (ref 0.2–0.9)
Monocyte %: 10.7 %
Neutrophil #: 10.9 10*3/uL — ABNORMAL HIGH (ref 1.4–6.5)
Platelet: 233 10*3/uL (ref 150–440)
RDW: 12.9 % (ref 11.5–14.5)
WBC: 14 10*3/uL — ABNORMAL HIGH (ref 3.6–11.0)

## 2012-01-10 LAB — CBC WITH DIFFERENTIAL/PLATELET
Basophil #: 0 10*3/uL (ref 0.0–0.1)
Eosinophil #: 0.1 10*3/uL (ref 0.0–0.7)
Eosinophil %: 0.5 %
HCT: 26.8 % — ABNORMAL LOW (ref 35.0–47.0)
HGB: 9.2 g/dL — ABNORMAL LOW (ref 12.0–16.0)
Lymphocyte #: 1.6 10*3/uL (ref 1.0–3.6)
MCH: 30.7 pg (ref 26.0–34.0)
MCHC: 34.2 g/dL (ref 32.0–36.0)
MCV: 90 fL (ref 80–100)
Monocyte #: 1.3 x10 3/mm — ABNORMAL HIGH (ref 0.2–0.9)
Monocyte %: 10.3 %
Neutrophil #: 9.9 10*3/uL — ABNORMAL HIGH (ref 1.4–6.5)
RBC: 2.98 10*6/uL — ABNORMAL LOW (ref 3.80–5.20)
RDW: 13.1 % (ref 11.5–14.5)

## 2012-01-11 LAB — BASIC METABOLIC PANEL
Anion Gap: 7 (ref 7–16)
BUN: 22 mg/dL — ABNORMAL HIGH (ref 7–18)
Chloride: 100 mmol/L (ref 98–107)
Creatinine: 1.17 mg/dL (ref 0.60–1.30)
EGFR (African American): 53 — ABNORMAL LOW
EGFR (Non-African Amer.): 46 — ABNORMAL LOW
Glucose: 249 mg/dL — ABNORMAL HIGH (ref 65–99)
Osmolality: 276 (ref 275–301)

## 2012-01-11 LAB — CBC WITH DIFFERENTIAL/PLATELET
Basophil #: 0 10*3/uL (ref 0.0–0.1)
Eosinophil %: 0.8 %
MCH: 31 pg (ref 26.0–34.0)
MCHC: 34.7 g/dL (ref 32.0–36.0)
Monocyte #: 0.9 x10 3/mm (ref 0.2–0.9)
Monocyte %: 8.7 %
Neutrophil %: 79.5 %
Platelet: 214 10*3/uL (ref 150–440)
RBC: 2.62 10*6/uL — ABNORMAL LOW (ref 3.80–5.20)
WBC: 10.6 10*3/uL (ref 3.6–11.0)

## 2012-01-13 LAB — BASIC METABOLIC PANEL
Anion Gap: 7 (ref 7–16)
Calcium, Total: 8.7 mg/dL (ref 8.5–10.1)
Chloride: 99 mmol/L (ref 98–107)
Co2: 25 mmol/L (ref 21–32)
Creatinine: 1.1 mg/dL (ref 0.60–1.30)
EGFR (African American): 57 — ABNORMAL LOW
EGFR (Non-African Amer.): 49 — ABNORMAL LOW
Osmolality: 277 (ref 275–301)

## 2012-01-13 LAB — CBC WITH DIFFERENTIAL/PLATELET
Basophil #: 0 10*3/uL (ref 0.0–0.1)
Eosinophil #: 0.2 10*3/uL (ref 0.0–0.7)
HCT: 25.2 % — ABNORMAL LOW (ref 35.0–47.0)
Lymphocyte #: 1 10*3/uL (ref 1.0–3.6)
Lymphocyte %: 12.4 %
MCHC: 34 g/dL (ref 32.0–36.0)
MCV: 91 fL (ref 80–100)
Neutrophil #: 5.7 10*3/uL (ref 1.4–6.5)
Platelet: 322 10*3/uL (ref 150–440)
RDW: 13.4 % (ref 11.5–14.5)

## 2012-01-13 LAB — HEMOGLOBIN A1C: Hemoglobin A1C: 8.6 % — ABNORMAL HIGH (ref 4.2–6.3)

## 2012-01-14 LAB — BASIC METABOLIC PANEL
Anion Gap: 7 (ref 7–16)
BUN: 22 mg/dL — ABNORMAL HIGH (ref 7–18)
Chloride: 101 mmol/L (ref 98–107)
Creatinine: 1.14 mg/dL (ref 0.60–1.30)
Potassium: 4 mmol/L (ref 3.5–5.1)
Sodium: 133 mmol/L — ABNORMAL LOW (ref 136–145)

## 2012-01-14 LAB — URINALYSIS, COMPLETE
Glucose,UR: NEGATIVE mg/dL (ref 0–75)
Hyaline Cast: 11
Ketone: NEGATIVE
Leukocyte Esterase: NEGATIVE
Ph: 5 (ref 4.5–8.0)
Protein: NEGATIVE
RBC,UR: 1 /HPF (ref 0–5)
Squamous Epithelial: 2
WBC UR: 2 /HPF (ref 0–5)

## 2012-01-21 ENCOUNTER — Ambulatory Visit: Payer: Medicare Other | Admitting: Internal Medicine

## 2012-01-21 DIAGNOSIS — Z0289 Encounter for other administrative examinations: Secondary | ICD-10-CM

## 2012-01-28 ENCOUNTER — Emergency Department: Payer: Self-pay | Admitting: Emergency Medicine

## 2012-01-31 ENCOUNTER — Telehealth: Payer: Self-pay | Admitting: Internal Medicine

## 2012-01-31 NOTE — Telephone Encounter (Signed)
Patient came from rehab and they have given her more medication . She think she is taking to much medication.

## 2012-02-03 ENCOUNTER — Ambulatory Visit: Payer: Medicare Other | Admitting: Internal Medicine

## 2012-02-04 ENCOUNTER — Encounter: Payer: Self-pay | Admitting: Internal Medicine

## 2012-02-04 ENCOUNTER — Ambulatory Visit (INDEPENDENT_AMBULATORY_CARE_PROVIDER_SITE_OTHER): Payer: Medicare Other | Admitting: Internal Medicine

## 2012-02-04 VITALS — BP 106/64 | HR 64 | Temp 98.3°F | Resp 16 | Wt 215.0 lb

## 2012-02-04 DIAGNOSIS — Z23 Encounter for immunization: Secondary | ICD-10-CM

## 2012-02-04 DIAGNOSIS — IMO0001 Reserved for inherently not codable concepts without codable children: Secondary | ICD-10-CM

## 2012-02-04 DIAGNOSIS — E1159 Type 2 diabetes mellitus with other circulatory complications: Secondary | ICD-10-CM | POA: Insufficient documentation

## 2012-02-04 DIAGNOSIS — E039 Hypothyroidism, unspecified: Secondary | ICD-10-CM

## 2012-02-04 DIAGNOSIS — R300 Dysuria: Secondary | ICD-10-CM

## 2012-02-04 DIAGNOSIS — E1165 Type 2 diabetes mellitus with hyperglycemia: Secondary | ICD-10-CM | POA: Insufficient documentation

## 2012-02-04 DIAGNOSIS — M25569 Pain in unspecified knee: Secondary | ICD-10-CM

## 2012-02-04 DIAGNOSIS — M25561 Pain in right knee: Secondary | ICD-10-CM

## 2012-02-04 DIAGNOSIS — IMO0002 Reserved for concepts with insufficient information to code with codable children: Secondary | ICD-10-CM

## 2012-02-04 LAB — COMPREHENSIVE METABOLIC PANEL
ALT: 11 U/L (ref 0–35)
AST: 16 U/L (ref 0–37)
Albumin: 3.3 g/dL — ABNORMAL LOW (ref 3.5–5.2)
Alkaline Phosphatase: 97 U/L (ref 39–117)
Calcium: 8.5 mg/dL (ref 8.4–10.5)
Chloride: 96 mEq/L (ref 96–112)
Potassium: 4 mEq/L (ref 3.5–5.1)
Sodium: 137 mEq/L (ref 135–145)
Total Protein: 7.2 g/dL (ref 6.0–8.3)

## 2012-02-04 LAB — POCT URINALYSIS DIPSTICK
Blood, UA: NEGATIVE
Ketones, UA: NEGATIVE
Protein, UA: 30
Spec Grav, UA: 1.015
Urobilinogen, UA: 0.2

## 2012-02-04 LAB — HEMOGLOBIN A1C: Hgb A1c MFr Bld: 7.2 % — ABNORMAL HIGH (ref 4.6–6.5)

## 2012-02-04 MED ORDER — HYDROCODONE-ACETAMINOPHEN 5-500 MG PO TABS: 1.0000 | ORAL_TABLET | Freq: Three times a day (TID) | ORAL | Status: DC | PRN

## 2012-02-04 MED ORDER — LEVOTHYROXINE SODIUM 150 MCG PO TABS: 150.0000 ug | ORAL_TABLET | Freq: Every day | ORAL | Status: DC

## 2012-02-04 NOTE — Telephone Encounter (Signed)
Pt was seen today, 09.17.13 for OV/SLS

## 2012-02-04 NOTE — Assessment & Plan Note (Signed)
Patient with right knee pain after total knee replacement. Will use hydrocodone as needed. She has not been able to tolerate oxycodone because of sedation. She has followup with her orthopedic surgeon. Followup here in 4 weeks.

## 2012-02-04 NOTE — Assessment & Plan Note (Signed)
Patient notes that urine appears cloudy. She was started on Macrobid at rehabilitation facility for urinary tract infection. Will repeat urine culture today.

## 2012-02-04 NOTE — Assessment & Plan Note (Signed)
Blood sugars have recently been elevated after surgery. Encourage compliance with Lantus insulin. Will check A1c with labs today. Followup in 4 weeks.

## 2012-02-04 NOTE — Progress Notes (Signed)
Subjective:    Patient ID: [name] [name], female    DOB: 11-07-1934, 77 y.o.   MRN: [id number]  HPI 77 year old female with history of diabetes, hypertension presents for followup after recent right knee replacement. She reports that she completed therapy at rehabilitation. She is at home now. She reports that she continues to have pain in her right knee. She was taking oxycodone but found this to be too sedating. She would like to try alternative medications. She would also like to stop using Lovenox injections because she finds these painful. She has not yet discussed this with her orthopedic surgeon.  In regards to diabetes, she reports blood sugars have been elevated, typically over 200. She reports compliance with her Lantus insulin.  She also notes that her urine has been cloudy over the last few days. She was started on Macrobid in her rehabilitation facility. She denies any fever, chills, flank pain.   Outpatient Encounter Prescriptions as of 02/04/2012  Medication Sig Dispense Refill  . amLODipine (NORVASC) 10 MG tablet Take 1 tablet (10 mg total) by mouth daily.  30 tablet  6  . cyclobenzaprine (FLEXERIL) 5 MG tablet Take 5 mg by mouth 3 (three) times daily as needed.      . furosemide (LASIX) 40 MG tablet Take 1 tablet (40 mg total) by mouth 2 (two) times daily.  60 tablet  6  . HYDROcodone-acetaminophen (VICODIN) 5-500 MG per tablet Take 1 tablet by mouth 3 (three) times daily as needed.  60 tablet  2  . insulin glargine (LANTUS) 100 UNIT/ML injection Inject 40 Units into the skin at bedtime.  30 mL  3  . insulin regular (NOVOLIN R,HUMULIN R) 100 units/mL injection Take 4 units if blood sugar is >250 before meals  30 mL  3  . isosorbide mononitrate (ISMO,MONOKET) 10 MG tablet Take 1 tablet (10 mg total) by mouth 2 (two) times daily.  60 tablet  6  . levothyroxine (LEVOTHROID) 150 MCG tablet Take 1 tablet (150 mcg total) by mouth daily.  30 tablet  11  . lisinopril  (PRINIVIL,ZESTRIL) 10 MG tablet Take 1 tablet (10 mg total) by mouth daily.  30 tablet  6  . meclizine (ANTIVERT) 25 MG tablet Take 1 tablet (25 mg total) by mouth daily. As needed for dizziness or nausea  90 tablet  3  . metFORMIN (GLUCOPHAGE) 500 MG tablet Take 1 tablet (500 mg total) by mouth 2 (two) times daily.  180 tablet  3  . metoprolol tartrate (LOPRESSOR) 25 MG tablet Take 1 tablet (25 mg total) by mouth 2 (two) times daily.  60 tablet  6  . nitroGLYCERIN (NITROSTAT) 0.4 MG SL tablet Place 1 tablet (0.4 mg total) under the tongue every 5 (five) minutes as needed.  25 tablet  3  . omeprazole (PRILOSEC) 20 MG capsule Take 1 capsule (20 mg total) by mouth 2 (two) times daily.  180 capsule  3  . potassium chloride SA (K-DUR,KLOR-CON) 20 MEQ tablet Take 1 tablet (20 mEq total) by mouth 2 (two) times daily.  180 tablet  3  . simvastatin (ZOCOR) 40 MG tablet Take 1 tablet (40 mg total) by mouth at bedtime.  90 tablet  3  . aspirin 81 MG tablet Take 162 mg by mouth daily.       . cephALEXin (KEFLEX) 250 MG capsule Take 250 mg by mouth daily as needed.      . nabumetone (RELAFEN) 750 MG tablet Take 1 tablet (750 mg  total) by mouth daily.  90 tablet  3  . traMADol (ULTRAM) 50 MG tablet        BP 106/64  Pulse 64  Temp 98.3 F (36.8 C) (Oral)  Resp 16  Wt 215 lb (97.523 kg)  SpO2 99%  Review of Systems  Constitutional: Negative for fever, chills, appetite change, fatigue and unexpected weight change.  HENT: Negative for ear pain, congestion, sore throat, trouble swallowing, neck pain, voice change and sinus pressure.   Eyes: Negative for visual disturbance.  Respiratory: Negative for cough, shortness of breath, wheezing and stridor.   Cardiovascular: Negative for chest pain, palpitations and leg swelling.  Gastrointestinal: Negative for nausea, vomiting, abdominal pain, diarrhea, constipation, blood in stool, abdominal distention and anal bleeding.  Genitourinary: Negative for dysuria and  flank pain.  Musculoskeletal: Positive for myalgias, joint swelling, arthralgias and gait problem.  Skin: Negative for color change and rash.  Neurological: Negative for dizziness and headaches.  Hematological: Negative for adenopathy. Does not bruise/bleed easily.  Psychiatric/Behavioral: Negative for suicidal ideas, disturbed wake/sleep cycle and dysphoric mood. The patient is not nervous/anxious.        Objective:   Physical Exam  Constitutional: She is oriented to person, place, and time. She appears well-developed and well-nourished. No distress.  HENT:  Head: Normocephalic and atraumatic.  Right Ear: External ear normal.  Left Ear: External ear normal.  Nose: Nose normal.  Mouth/Throat: Oropharynx is clear and moist. No oropharyngeal exudate.  Eyes: Conjunctivae normal are normal. Pupils are equal, round, and reactive to light. Right eye exhibits no discharge. Left eye exhibits no discharge. No scleral icterus.  Neck: Normal range of motion. Neck supple. No tracheal deviation present. No thyromegaly present.  Cardiovascular: Normal rate, regular rhythm, normal heart sounds and intact distal pulses.  Exam reveals no gallop and no friction rub.   No murmur heard. Pulmonary/Chest: Effort normal and breath sounds normal. No respiratory distress. She has no wheezes. She has no rales. She exhibits no tenderness.  Musculoskeletal: Normal range of motion. She exhibits no edema and no tenderness.       Legs: Lymphadenopathy:    She has no cervical adenopathy.  Neurological: She is alert and oriented to person, place, and time. No cranial nerve deficit. She exhibits normal muscle tone. Coordination normal.  Skin: Skin is warm and dry. No rash noted. She is not diaphoretic. No erythema. No pallor.  Psychiatric: She has a normal mood and affect. Her behavior is normal. Judgment and thought content normal.          Assessment & Plan:

## 2012-02-06 LAB — URINE CULTURE: Colony Count: 75000

## 2012-02-10 ENCOUNTER — Other Ambulatory Visit: Payer: Medicare Other

## 2012-02-10 DIAGNOSIS — N390 Urinary tract infection, site not specified: Secondary | ICD-10-CM

## 2012-02-12 LAB — URINE CULTURE: Colony Count: >=100000

## 2012-02-14 ENCOUNTER — Other Ambulatory Visit (INDEPENDENT_AMBULATORY_CARE_PROVIDER_SITE_OTHER): Payer: Medicare Other

## 2012-02-14 DIAGNOSIS — R300 Dysuria: Secondary | ICD-10-CM

## 2012-02-14 LAB — POCT URINALYSIS DIPSTICK
Bilirubin, UA: NEGATIVE
Blood, UA: NEGATIVE
Ketones, UA: NEGATIVE
pH, UA: 5

## 2012-02-16 LAB — URINE CULTURE

## 2012-02-17 ENCOUNTER — Telehealth: Payer: Self-pay | Admitting: Internal Medicine

## 2012-02-17 DIAGNOSIS — M25561 Pain in right knee: Secondary | ICD-10-CM

## 2012-02-17 NOTE — Telephone Encounter (Signed)
Patient wants her prescription for her hydrocodone sent to [facility] [phone].

## 2012-02-17 NOTE — Telephone Encounter (Signed)
We can refill #30 hydrocodone no additional refills

## 2012-02-18 ENCOUNTER — Other Ambulatory Visit: Payer: Self-pay

## 2012-02-19 MED ORDER — HYDROCODONE-ACETAMINOPHEN 5-500 MG PO TABS: 1.0000 | ORAL_TABLET | Freq: Three times a day (TID) | ORAL | Status: DC | PRN

## 2012-02-19 NOTE — Telephone Encounter (Signed)
rx called into pharmacy. [facility]

## 2012-03-02 ENCOUNTER — Encounter: Payer: Self-pay | Admitting: Internal Medicine

## 2012-03-02 ENCOUNTER — Ambulatory Visit (INDEPENDENT_AMBULATORY_CARE_PROVIDER_SITE_OTHER): Payer: Medicare Other | Admitting: Internal Medicine

## 2012-03-02 VITALS — BP 122/70 | HR 73 | Temp 98.3°F | Ht 63.0 in | Wt 217.2 lb

## 2012-03-02 DIAGNOSIS — N390 Urinary tract infection, site not specified: Secondary | ICD-10-CM

## 2012-03-02 DIAGNOSIS — E1165 Type 2 diabetes mellitus with hyperglycemia: Secondary | ICD-10-CM

## 2012-03-02 DIAGNOSIS — IMO0001 Reserved for inherently not codable concepts without codable children: Secondary | ICD-10-CM

## 2012-03-02 DIAGNOSIS — R1013 Epigastric pain: Secondary | ICD-10-CM

## 2012-03-02 DIAGNOSIS — IMO0002 Reserved for concepts with insufficient information to code with codable children: Secondary | ICD-10-CM

## 2012-03-02 LAB — POCT URINALYSIS DIPSTICK
Glucose, UA: NEGATIVE
Nitrite, UA: NEGATIVE
Protein, UA: 100
Urobilinogen, UA: 0.2

## 2012-03-02 NOTE — Assessment & Plan Note (Signed)
Recent A1c was improved at 7.2%. We'll continue current medications. Followup in 4 weeks or sooner as needed.

## 2012-03-02 NOTE — Progress Notes (Signed)
Subjective:    Patient ID: [name] [name], female    DOB: 04/13/1935, 77 y.o.   MRN: [id number]  HPI 77 year old female with history of diabetes, hypertension, hyperlipidemia, osteoarthritis status post right knee replacement presents for followup. She reports that she is generally doing well. She notes her blood sugars continue to be quite variable however are better controlled than previous. She reports full compliance with her medications. In regards to osteoarthritis, she reports that she is gradually improving her ability to move her right knee postoperatively. She continues to be dissipating physical therapy.  She is concerned today about several week history of epigastric abdominal pain. She reports this first began after she develops diarrhea and dehydration when in rehabilitation. She feels that her symptoms are gradually improving. She has been taking omeprazole twice daily. She denies any nausea, vomiting, diarrhea, blood in her stool, fever, chills.  Outpatient Encounter Prescriptions as of 03/02/2012  Medication Sig Dispense Refill  . amLODipine (NORVASC) 10 MG tablet Take 1 tablet (10 mg total) by mouth daily.  30 tablet  6  . aspirin 81 MG tablet Take 162 mg by mouth daily.       . cyclobenzaprine (FLEXERIL) 5 MG tablet Take 5 mg by mouth 3 (three) times daily as needed.      . furosemide (LASIX) 40 MG tablet Take 1 tablet (40 mg total) by mouth 2 (two) times daily.  60 tablet  6  . HYDROcodone-acetaminophen (VICODIN) 5-500 MG per tablet Take 1 tablet by mouth 3 (three) times daily as needed.  30 tablet  0  . insulin glargine (LANTUS) 100 UNIT/ML injection Inject 40 Units into the skin at bedtime.  30 mL  3  . isosorbide mononitrate (ISMO,MONOKET) 10 MG tablet Take 1 tablet (10 mg total) by mouth 2 (two) times daily.  60 tablet  6  . levothyroxine (LEVOTHROID) 150 MCG tablet Take 1 tablet (150 mcg total) by mouth daily.  30 tablet  11  . lisinopril (PRINIVIL,ZESTRIL) 10 MG tablet  Take 1 tablet (10 mg total) by mouth daily.  30 tablet  6  . meclizine (ANTIVERT) 25 MG tablet Take 1 tablet (25 mg total) by mouth daily. As needed for dizziness or nausea  90 tablet  3  . metFORMIN (GLUCOPHAGE) 500 MG tablet Take 1 tablet (500 mg total) by mouth 2 (two) times daily.  180 tablet  3  . metoprolol tartrate (LOPRESSOR) 25 MG tablet Take 1 tablet (25 mg total) by mouth 2 (two) times daily.  60 tablet  6  . nitroGLYCERIN (NITROSTAT) 0.4 MG SL tablet Place 1 tablet (0.4 mg total) under the tongue every 5 (five) minutes as needed.  25 tablet  3  . omeprazole (PRILOSEC) 20 MG capsule Take 1 capsule (20 mg total) by mouth 2 (two) times daily.  180 capsule  3  . potassium chloride SA (K-DUR,KLOR-CON) 20 MEQ tablet Take 1 tablet (20 mEq total) by mouth 2 (two) times daily.  180 tablet  3  . simvastatin (ZOCOR) 40 MG tablet Take 1 tablet (40 mg total) by mouth at bedtime.  90 tablet  3  . traMADol (ULTRAM) 50 MG tablet        BP 122/70  Pulse 73  Temp 98.3 F (36.8 C) (Oral)  Ht 5\' 3"  (1.6 m)  Wt 217 lb 4 oz (98.544 kg)  BMI 38.48 kg/m2  SpO2 97%  Review of Systems  Constitutional: Negative for fever, chills, appetite change, fatigue and unexpected weight change.  HENT: Negative for ear pain, congestion, sore throat, trouble swallowing, neck pain, voice change and sinus pressure.   Eyes: Negative for visual disturbance.  Respiratory: Negative for cough, shortness of breath, wheezing and stridor.   Cardiovascular: Negative for chest pain, palpitations and leg swelling.  Gastrointestinal: Positive for abdominal pain. Negative for nausea, vomiting, diarrhea, constipation, blood in stool, abdominal distention and anal bleeding.  Genitourinary: Negative for dysuria and flank pain.  Musculoskeletal: Positive for myalgias, arthralgias and gait problem.  Skin: Negative for color change and rash.  Neurological: Negative for dizziness and headaches.  Hematological: Negative for adenopathy.  Does not bruise/bleed easily.  Psychiatric/Behavioral: Negative for suicidal ideas, disturbed wake/sleep cycle and dysphoric mood. The patient is not nervous/anxious.        Objective:   Physical Exam  Constitutional: She is oriented to person, place, and time. She appears well-developed and well-nourished. No distress.  HENT:  Head: Normocephalic and atraumatic.  Right Ear: External ear normal.  Left Ear: External ear normal.  Nose: Nose normal.  Mouth/Throat: Oropharynx is clear and moist. No oropharyngeal exudate.  Eyes: Conjunctivae normal are normal. Pupils are equal, round, and reactive to light. Right eye exhibits no discharge. Left eye exhibits no discharge. No scleral icterus.  Neck: Normal range of motion. Neck supple. No tracheal deviation present. No thyromegaly present.  Cardiovascular: Normal rate, regular rhythm, normal heart sounds and intact distal pulses.  Exam reveals no gallop and no friction rub.   No murmur heard. Pulmonary/Chest: Effort normal and breath sounds normal. No respiratory distress. She has no wheezes. She has no rales. She exhibits no tenderness.  Abdominal: Soft. Bowel sounds are normal. She exhibits no distension and no mass. There is tenderness (epigastric area). There is no rebound and no guarding.  Musculoskeletal: She exhibits no edema and no tenderness.       Right knee: She exhibits decreased range of motion.       Legs: Lymphadenopathy:    She has no cervical adenopathy.  Neurological: She is alert and oriented to person, place, and time. No cranial nerve deficit. She exhibits normal muscle tone. Coordination normal.  Skin: Skin is warm and dry. No rash noted. She is not diaphoretic. No erythema. No pallor.  Psychiatric: She has a normal mood and affect. Her behavior is normal. Judgment and thought content normal.          Assessment & Plan:

## 2012-03-02 NOTE — Assessment & Plan Note (Signed)
Patient with several week history of epigastric abdominal pain. Suspect gastritis or ulcer. Question H. pylori. Will send testing for H. pylori today. We'll have her continue omeprazole twice daily. She will call or return to clinic if symptoms are worsening or become severe. If symptoms do not continue to improve, would favor GI referral for EGD. Followup in 4 weeks.

## 2012-03-04 LAB — URINE CULTURE

## 2012-03-05 ENCOUNTER — Other Ambulatory Visit: Payer: Self-pay | Admitting: *Deleted

## 2012-03-05 ENCOUNTER — Telehealth: Payer: Self-pay | Admitting: Internal Medicine

## 2012-03-05 MED ORDER — NYSTATIN-TRIAMCINOLONE 100000-0.1 UNIT/GM-% EX CREA: 1.0000 "application " | TOPICAL_CREAM | Freq: Two times a day (BID) | CUTANEOUS | Status: DC | PRN

## 2012-03-05 MED ORDER — NYSTATIN-TRIAMCINOLONE 100000-0.1 UNIT/GM-% EX OINT: TOPICAL_OINTMENT | Freq: Two times a day (BID) | CUTANEOUS | Status: DC

## 2012-03-05 NOTE — Telephone Encounter (Signed)
Caller: Margorie/Patient; Patient Name: [name] [name]; PCP: Ronna Polio (Adults only); Best Callback Phone Number: [phone] Chronic urinary symptoms for long time.  Nocturia, urinates small amounts during day.  Afebrile.  Seen in office Monday 10/14 and had urine culture.  Got call from office and trying to call back - thinks message was that she would have medicine for itching but pharmacy has not received prescription yet.   Wanting to know how she is to treat the itching and has scratched until raw.  Still cloudy urine at times and sometimes clear.  Low back discomfort also present for very long time.    Triaged in Urinary Symptoms Guideline - Disposition:  See Provider Within 24 Hours due to Genital itching. Uses [facility]   [phone].   Can reach patient at  [phone] please.

## 2012-03-05 NOTE — Telephone Encounter (Signed)
Urine culture showed mixed small amount of bacteria.  I would recommend starting Triamcinolon/Nystatin cream (I will call in) topically and setting up exam with pelvic exam. (see previous note)

## 2012-03-05 NOTE — Telephone Encounter (Signed)
Patient advised via telephone

## 2012-04-08 ENCOUNTER — Encounter: Payer: Self-pay | Admitting: Internal Medicine

## 2012-04-08 ENCOUNTER — Ambulatory Visit (INDEPENDENT_AMBULATORY_CARE_PROVIDER_SITE_OTHER): Payer: Medicare Other | Admitting: Internal Medicine

## 2012-04-08 VITALS — BP 112/66 | HR 69 | Temp 97.6°F | Resp 15 | Wt 214.5 lb

## 2012-04-08 DIAGNOSIS — G629 Polyneuropathy, unspecified: Secondary | ICD-10-CM

## 2012-04-08 DIAGNOSIS — G589 Mononeuropathy, unspecified: Secondary | ICD-10-CM

## 2012-04-08 DIAGNOSIS — B3731 Acute candidiasis of vulva and vagina: Secondary | ICD-10-CM | POA: Insufficient documentation

## 2012-04-08 DIAGNOSIS — M25569 Pain in unspecified knee: Secondary | ICD-10-CM

## 2012-04-08 DIAGNOSIS — E1165 Type 2 diabetes mellitus with hyperglycemia: Secondary | ICD-10-CM

## 2012-04-08 DIAGNOSIS — B373 Candidiasis of vulva and vagina: Secondary | ICD-10-CM

## 2012-04-08 DIAGNOSIS — IMO0002 Reserved for concepts with insufficient information to code with codable children: Secondary | ICD-10-CM

## 2012-04-08 DIAGNOSIS — M25561 Pain in right knee: Secondary | ICD-10-CM

## 2012-04-08 DIAGNOSIS — G8929 Other chronic pain: Secondary | ICD-10-CM

## 2012-04-08 DIAGNOSIS — M549 Dorsalgia, unspecified: Secondary | ICD-10-CM

## 2012-04-08 DIAGNOSIS — IMO0001 Reserved for inherently not codable concepts without codable children: Secondary | ICD-10-CM

## 2012-04-08 LAB — POCT URINALYSIS DIPSTICK
Bilirubin, UA: NEGATIVE
Ketones, UA: NEGATIVE
pH, UA: 5.5

## 2012-04-08 MED ORDER — LIRAGLUTIDE 18 MG/3ML ~~LOC~~ SOLN: 0.6000 mg | Freq: Every day | SUBCUTANEOUS | Status: DC

## 2012-04-08 MED ORDER — GABAPENTIN 100 MG PO CAPS: 100.0000 mg | ORAL_CAPSULE | Freq: Three times a day (TID) | ORAL | Status: DC

## 2012-04-08 MED ORDER — FLUCONAZOLE 150 MG PO TABS: 150.0000 mg | ORAL_TABLET | Freq: Once | ORAL | Status: DC

## 2012-04-08 MED ORDER — HYDROCODONE-ACETAMINOPHEN 5-500 MG PO TABS: 1.0000 | ORAL_TABLET | Freq: Three times a day (TID) | ORAL | Status: DC | PRN

## 2012-04-08 NOTE — Progress Notes (Signed)
Subjective:    Patient ID: [name] Bible, female    DOB: [birth year], 77 y.o.   MRN: [id number]  HPI 77 year old female with history of hypertension, hypothyroidism, diabetes, and osteoarthritis presents for followup. In regards to diabetes, she reports blood sugars have been fairly well-controlled. She did not bring a record of blood sugars today. She reports full compliance with medication. She would like to consider trying new medication, Victoza, to better control blood sugars and help with weight loss. She reports that she eats very little but has difficulty losing weight. Exercise is limited because of chronic osteoarthritis.   She reports chronic ongoing pain in her lower back. This limits her ability to exercise. She is currently using Flexeril, and occasional hydrocodone with pain relief. We reviewed MRI lumbar spine results today. This showed spinal stenosis and degenerative changes. She is not interested in referral to neurosurgery for evaluation at this time. She like to continue on current medications.  Outpatient Encounter Prescriptions as of 04/08/2012  Medication Sig Dispense Refill  . amLODipine (NORVASC) 10 MG tablet Take 1 tablet (10 mg total) by mouth daily.  30 tablet  6  . aspirin 81 MG tablet Take 162 mg by mouth daily.       . cyclobenzaprine (FLEXERIL) 5 MG tablet Take 5 mg by mouth 3 (three) times daily as needed.      . furosemide (LASIX) 40 MG tablet Take 1 tablet (40 mg total) by mouth 2 (two) times daily.  60 tablet  6  . HYDROcodone-acetaminophen (VICODIN) 5-500 MG per tablet Take 1 tablet by mouth 3 (three) times daily as needed.  90 tablet  3  . insulin glargine (LANTUS) 100 UNIT/ML injection Inject 40 Units into the skin at bedtime.  30 mL  3  . isosorbide mononitrate (ISMO,MONOKET) 10 MG tablet Take 1 tablet (10 mg total) by mouth 2 (two) times daily.  60 tablet  6  . latanoprost (XALATAN) 0.005 % ophthalmic solution       . levothyroxine (LEVOTHROID) 150 MCG  tablet Take 1 tablet (150 mcg total) by mouth daily.  30 tablet  11  . lisinopril (PRINIVIL,ZESTRIL) 10 MG tablet Take 1 tablet (10 mg total) by mouth daily.  30 tablet  6  . meclizine (ANTIVERT) 25 MG tablet Take 1 tablet (25 mg total) by mouth daily. As needed for dizziness or nausea  90 tablet  3  . meloxicam (MOBIC) 7.5 MG tablet       . metFORMIN (GLUCOPHAGE) 500 MG tablet Take 1 tablet (500 mg total) by mouth 2 (two) times daily.  180 tablet  3  . metoprolol tartrate (LOPRESSOR) 25 MG tablet Take 1 tablet (25 mg total) by mouth 2 (two) times daily.  60 tablet  6  . nitroGLYCERIN (NITROSTAT) 0.4 MG SL tablet Place 1 tablet (0.4 mg total) under the tongue every 5 (five) minutes as needed.  25 tablet  3  . nystatin-triamcinolone (MYCOLOG II) cream Apply 1 application topically 2 (two) times daily as needed.  30 g  1  . nystatin-triamcinolone ointment (MYCOLOG) Apply topically 2 (two) times daily.  30 g  0  . omeprazole (PRILOSEC) 20 MG capsule Take 1 capsule (20 mg total) by mouth 2 (two) times daily.  180 capsule  3  . potassium chloride SA (K-DUR,KLOR-CON) 20 MEQ tablet Take 1 tablet (20 mEq total) by mouth 2 (two) times daily.  180 tablet  3  . simvastatin (ZOCOR) 40 MG tablet Take 1 tablet (40  mg total) by mouth at bedtime.  90 tablet  3  . traMADol (ULTRAM) 50 MG tablet       . [DISCONTINUED] HYDROcodone-acetaminophen (VICODIN) 5-500 MG per tablet Take 1 tablet by mouth 3 (three) times daily as needed.  30 tablet  0  . fluconazole (DIFLUCAN) 150 MG tablet Take 1 tablet (150 mg total) by mouth once.  1 tablet  0  . gabapentin (NEURONTIN) 100 MG capsule Take 1 capsule (100 mg total) by mouth 3 (three) times daily.  90 capsule  3  . Liraglutide (VICTOZA) 18 MG/3ML SOLN Inject 0.1 mLs (0.6 mg total) into the skin daily.  6 mL  0   BP 112/66  Pulse 69  Temp 97.6 F (36.4 C) (Oral)  Resp 15  Wt 214 lb 8 oz (97.297 kg)  SpO2 97%  Review of Systems  Constitutional: Negative for fever,  chills, appetite change, fatigue and unexpected weight change.  HENT: Negative for ear pain, congestion, sore throat, trouble swallowing, neck pain, voice change and sinus pressure.   Eyes: Negative for visual disturbance.  Respiratory: Negative for cough, shortness of breath, wheezing and stridor.   Cardiovascular: Negative for chest pain, palpitations and leg swelling.  Gastrointestinal: Negative for nausea, vomiting, abdominal pain, diarrhea, constipation, blood in stool, abdominal distention and anal bleeding.  Genitourinary: Negative for dysuria and flank pain.  Musculoskeletal: Positive for myalgias and arthralgias. Negative for gait problem.  Skin: Negative for color change and rash.  Neurological: Negative for dizziness and headaches.  Hematological: Negative for adenopathy. Does not bruise/bleed easily.  Psychiatric/Behavioral: Negative for suicidal ideas, sleep disturbance and dysphoric mood. The patient is not nervous/anxious.        Objective:   Physical Exam  Constitutional: She is oriented to person, place, and time. She appears well-developed and well-nourished. No distress.  HENT:  Head: Normocephalic and atraumatic.  Right Ear: External ear normal.  Left Ear: External ear normal.  Nose: Nose normal.  Mouth/Throat: Oropharynx is clear and moist. No oropharyngeal exudate.  Eyes: Conjunctivae normal are normal. Pupils are equal, round, and reactive to light. Right eye exhibits no discharge. Left eye exhibits no discharge. No scleral icterus.  Neck: Normal range of motion. Neck supple. No tracheal deviation present. No thyromegaly present.  Cardiovascular: Normal rate, regular rhythm, normal heart sounds and intact distal pulses.  Exam reveals no gallop and no friction rub.   No murmur heard. Pulmonary/Chest: Effort normal and breath sounds normal. No respiratory distress. She has no wheezes. She has no rales. She exhibits no tenderness.  Musculoskeletal: She exhibits no  edema and no tenderness.       Lumbar back: She exhibits decreased range of motion and pain. She exhibits no tenderness.  Lymphadenopathy:    She has no cervical adenopathy.  Neurological: She is alert and oriented to person, place, and time. No cranial nerve deficit. She exhibits normal muscle tone. Coordination normal.  Skin: Skin is warm and dry. No rash noted. She is not diaphoretic. No erythema. No pallor.  Psychiatric: She has a normal mood and affect. Her behavior is normal. Judgment and thought content normal.          Assessment & Plan:

## 2012-04-08 NOTE — Assessment & Plan Note (Signed)
Symptoms consistent with vaginal candidiasis. Will treat with Diflucan. Patient will call if no improvement. We discussed pelvic exam with culture if symptoms are persistent.

## 2012-04-08 NOTE — Assessment & Plan Note (Signed)
Symptoms well controlled on current medications. Will continue. We reviewed recent MRI lumbar spine and findings of DJD and spinal stenosis. Recommended [facility] referral, however pt would like to defer for now. Pt is having pain relief with hydrocodone prn, and we discussed referral to pain management for chronic narcotics. Referral placed.

## 2012-04-08 NOTE — Assessment & Plan Note (Signed)
Will check A1c with labs today. Patient would like to try Victoza to see if she can obtain better blood sugar control and weight loss. We discussed potential side effects of nausea. Patient will start at 0.6 mg daily. She will monitor blood sugars carefully for hypoglycemia. She will followup in one month.

## 2012-04-09 LAB — COMPREHENSIVE METABOLIC PANEL
ALT: 13 U/L (ref 0–35)
AST: 14 U/L (ref 0–37)
Albumin: 4 g/dL (ref 3.5–5.2)
Alkaline Phosphatase: 74 U/L (ref 39–117)
Potassium: 4.5 mEq/L (ref 3.5–5.1)
Sodium: 135 mEq/L (ref 135–145)
Total Protein: 7.9 g/dL (ref 6.0–8.3)

## 2012-04-10 LAB — URINE CULTURE: Colony Count: >=100000

## 2012-04-10 MED ORDER — CIPROFLOXACIN HCL 500 MG PO TABS: 500.0000 mg | ORAL_TABLET | Freq: Two times a day (BID) | ORAL | Status: DC

## 2012-04-10 NOTE — Addendum Note (Signed)
Addended by: Jackson Latino on: 04/10/2012 12:08 PM   Modules accepted: Orders

## 2012-04-14 ENCOUNTER — Telehealth: Payer: Self-pay | Admitting: Internal Medicine

## 2012-04-14 NOTE — Telephone Encounter (Signed)
Pt has been taking Cipro for 5 days. Advised to stop the Victoza

## 2012-04-14 NOTE — Telephone Encounter (Signed)
Patient Information:  Caller Name: Morine  Phone: [phone]  Patient: [name] [name]  Gender: Female  DOB: Dec 24, 1934  Age: 77 Years  PCP: Ronna Polio (Adults only)   Symptoms  Reason For Call & Symptoms: abdominal pain with episodes of vomiting x 1  Reviewed Health History In EMR: Yes  Reviewed Medications In EMR: Yes  Reviewed Allergies In EMR: Yes  Date of Onset of Symptoms: 04/13/2012  Treatments Tried: emetrol  Treatments Tried Worked: No  Guideline(s) Used:  Vomiting  Disposition Per Guideline:   Callback by PCP Today  Reason For Disposition Reached:   Vomiting a prescription medication  Advice Given:  N/A  Office Follow Up:  Does the office need to follow up with this patient?: Yes  Instructions For The Office: Vomiting x 1,, nausea upper abdominal discomfort; taking victoza and cipro; BS 188 krs/can  RN Note:  Taking cipro for positive e. coli in urine.  BS 188 on arising AM 04/14/12.  Abdominal pain noted "in upper part of my stomach," and patient believes it may be secondary to her Victoza or cipro.  INfo to office for staff/provider review/callback.  May reach patient at [phone].

## 2012-04-14 NOTE — Telephone Encounter (Signed)
I would recommend that she stop the Victoza.  How many days of Cipro has she completed?

## 2012-04-14 NOTE — Telephone Encounter (Signed)
Pt.notified

## 2012-04-14 NOTE — Telephone Encounter (Signed)
OK. She can also stop Cipro.

## 2012-05-06 ENCOUNTER — Ambulatory Visit (INDEPENDENT_AMBULATORY_CARE_PROVIDER_SITE_OTHER): Payer: Medicare Other | Admitting: Internal Medicine

## 2012-05-06 ENCOUNTER — Encounter: Payer: Self-pay | Admitting: Internal Medicine

## 2012-05-06 VITALS — BP 124/72 | HR 69 | Temp 98.2°F | Resp 16 | Wt 215.0 lb

## 2012-05-06 DIAGNOSIS — E1165 Type 2 diabetes mellitus with hyperglycemia: Secondary | ICD-10-CM

## 2012-05-06 DIAGNOSIS — M549 Dorsalgia, unspecified: Secondary | ICD-10-CM

## 2012-05-06 DIAGNOSIS — IMO0001 Reserved for inherently not codable concepts without codable children: Secondary | ICD-10-CM

## 2012-05-06 DIAGNOSIS — IMO0002 Reserved for concepts with insufficient information to code with codable children: Secondary | ICD-10-CM

## 2012-05-06 DIAGNOSIS — E669 Obesity, unspecified: Secondary | ICD-10-CM

## 2012-05-06 LAB — POCT URINALYSIS DIPSTICK
Blood, UA: NEGATIVE
Glucose, UA: NEGATIVE
Nitrite, UA: NEGATIVE
Protein, UA: 30
Urobilinogen, UA: 0.2
pH, UA: 5

## 2012-05-06 MED ORDER — TRAMADOL HCL 50 MG PO TABS: 50.0000 mg | ORAL_TABLET | Freq: Three times a day (TID) | ORAL | Status: DC | PRN

## 2012-05-06 MED ORDER — INSULIN GLARGINE 100 UNIT/ML ~~LOC~~ SOLN: 50.0000 [IU] | Freq: Every day | SUBCUTANEOUS | Status: DC

## 2012-05-06 NOTE — Assessment & Plan Note (Signed)
Patient with a several day history of right sided back and flank pain. Exam is most consistent with musculoskeletal strain. Will continue meloxicam and Flexeril. Will continue hydrocodone as needed for severe pain. Urinalysis is negative for blood and there are no other symptoms to suggest nephrolithiasis. We discussed potentially getting imaging to she would like to hold off for now. She will call if symptoms are persistent or worsening.

## 2012-05-06 NOTE — Assessment & Plan Note (Signed)
Blood sugars continue to be poorly controlled. Will increase Lantus to 50 units daily. Patient was unable to tolerate Victoza because of nausea. Encouraged compliance with diet. Encouraged increased physical activity. Note the physical activity is limited by chronic back pain and osteoarthritis.

## 2012-05-06 NOTE — Progress Notes (Signed)
Subjective:    Patient ID: [name] Bible, female    DOB: 1935/04/23, 77 y.o.   MRN: [id number]  HPI 77 year old female with history of diabetes, obesity, hypertension, hypothyroidism, osteoarthritis presents for followup. In regards to diabetes, she reports that blood sugars have been elevated typically between 150 and 200. She has not been compliant with diet. She was unable to tolerate Victoza because of nausea. She does report compliance with her insulin. She has tried to increase her physical activity, participating in a local dancing group.  She is concerned today about several day history of right-sided low back and flank pain. She believes that this pain first occurred when she was doing some laundry and lifting heavy objects. Pain is described as aching which is worse with movement. She denies any fever, chills, change in bowel habits, dysuria, urinary frequency, urgency. She does occasionally have pain that radiates around to her right upper abdomen. However this is rare.  Outpatient Encounter Prescriptions as of 05/06/2012  Medication Sig Dispense Refill  . amLODipine (NORVASC) 10 MG tablet Take 1 tablet (10 mg total) by mouth daily.  30 tablet  6  . aspirin 81 MG tablet Take 162 mg by mouth daily.       . cyclobenzaprine (FLEXERIL) 5 MG tablet Take 5 mg by mouth 3 (three) times daily as needed.      . fluconazole (DIFLUCAN) 150 MG tablet Take 1 tablet (150 mg total) by mouth once.  1 tablet  0  . furosemide (LASIX) 40 MG tablet Take 1 tablet (40 mg total) by mouth 2 (two) times daily.  60 tablet  6  . gabapentin (NEURONTIN) 100 MG capsule Take 1 capsule (100 mg total) by mouth 3 (three) times daily.  90 capsule  3  . HYDROcodone-acetaminophen (VICODIN) 5-500 MG per tablet Take 1 tablet by mouth 3 (three) times daily as needed.  90 tablet  3  . insulin glargine (LANTUS) 100 UNIT/ML injection Inject 50 Units into the skin at bedtime.  30 mL  3  . isosorbide mononitrate (ISMO,MONOKET)  10 MG tablet Take 1 tablet (10 mg total) by mouth 2 (two) times daily.  60 tablet  6  . latanoprost (XALATAN) 0.005 % ophthalmic solution       . levothyroxine (LEVOTHROID) 150 MCG tablet Take 1 tablet (150 mcg total) by mouth daily.  30 tablet  11  . lisinopril (PRINIVIL,ZESTRIL) 10 MG tablet Take 1 tablet (10 mg total) by mouth daily.  30 tablet  6  . meclizine (ANTIVERT) 25 MG tablet Take 1 tablet (25 mg total) by mouth daily. As needed for dizziness or nausea  90 tablet  3  . meloxicam (MOBIC) 7.5 MG tablet       . metFORMIN (GLUCOPHAGE) 500 MG tablet Take 1 tablet (500 mg total) by mouth 2 (two) times daily.  180 tablet  3  . metoprolol tartrate (LOPRESSOR) 25 MG tablet Take 1 tablet (25 mg total) by mouth 2 (two) times daily.  60 tablet  6  . nitroGLYCERIN (NITROSTAT) 0.4 MG SL tablet Place 1 tablet (0.4 mg total) under the tongue every 5 (five) minutes as needed.  25 tablet  3  . nystatin-triamcinolone (MYCOLOG II) cream Apply 1 application topically 2 (two) times daily as needed.  30 g  1  . nystatin-triamcinolone ointment (MYCOLOG) Apply topically 2 (two) times daily.  30 g  0  . omeprazole (PRILOSEC) 20 MG capsule Take 1 capsule (20 mg total) by mouth 2 (two)  times daily.  180 capsule  3  . potassium chloride SA (K-DUR,KLOR-CON) 20 MEQ tablet Take 1 tablet (20 mEq total) by mouth 2 (two) times daily.  180 tablet  3  . simvastatin (ZOCOR) 40 MG tablet Take 1 tablet (40 mg total) by mouth at bedtime.  90 tablet  3  . traMADol (ULTRAM) 50 MG tablet Take 1 tablet (50 mg total) by mouth every 8 (eight) hours as needed for pain.  90 tablet  3   BP 124/72  Pulse 69  Temp 98.2 F (36.8 C) (Oral)  Resp 16  Wt 215 lb (97.523 kg)  SpO2 99%  Review of Systems  Constitutional: Negative for fever, chills, appetite change, fatigue and unexpected weight change.  HENT: Negative for ear pain, congestion, sore throat, trouble swallowing, neck pain, voice change and sinus pressure.   Eyes: Negative  for visual disturbance.  Respiratory: Negative for cough, shortness of breath, wheezing and stridor.   Cardiovascular: Negative for chest pain, palpitations and leg swelling.  Gastrointestinal: Negative for nausea, vomiting, abdominal pain, diarrhea, constipation, blood in stool, abdominal distention and anal bleeding.  Genitourinary: Positive for flank pain. Negative for dysuria.  Musculoskeletal: Positive for back pain. Negative for myalgias, arthralgias and gait problem.  Skin: Negative for color change and rash.  Neurological: Negative for dizziness and headaches.  Hematological: Negative for adenopathy. Does not bruise/bleed easily.  Psychiatric/Behavioral: Negative for suicidal ideas, sleep disturbance and dysphoric mood. The patient is not nervous/anxious.        Objective:   Physical Exam  Constitutional: She is oriented to person, place, and time. She appears well-developed and well-nourished. No distress.  HENT:  Head: Normocephalic and atraumatic.  Right Ear: External ear normal.  Left Ear: External ear normal.  Nose: Nose normal.  Mouth/Throat: Oropharynx is clear and moist. No oropharyngeal exudate.  Eyes: Conjunctivae normal are normal. Pupils are equal, round, and reactive to light. Right eye exhibits no discharge. Left eye exhibits no discharge. No scleral icterus.  Neck: Normal range of motion. Neck supple. No tracheal deviation present. No thyromegaly present.  Cardiovascular: Normal rate, regular rhythm, normal heart sounds and intact distal pulses.  Exam reveals no gallop and no friction rub.   No murmur heard. Pulmonary/Chest: Effort normal and breath sounds normal. No respiratory distress. She has no wheezes. She has no rales. She exhibits no tenderness.  Musculoskeletal: Normal range of motion. She exhibits no edema and no tenderness.       Back:  Lymphadenopathy:    She has no cervical adenopathy.  Neurological: She is alert and oriented to person, place, and  time. No cranial nerve deficit. She exhibits normal muscle tone. Coordination normal.  Skin: Skin is warm and dry. No rash noted. She is not diaphoretic. No erythema. No pallor.  Psychiatric: She has a normal mood and affect. Her behavior is normal. Judgment and thought content normal.          Assessment & Plan:

## 2012-05-06 NOTE — Assessment & Plan Note (Signed)
Weight has been stable. Encouraged her to continue efforts at healthy diet and work on limiting intake of processed carbohydrates. Encouraged her to increase physical activity.

## 2012-05-15 ENCOUNTER — Telehealth: Payer: Self-pay | Admitting: Internal Medicine

## 2012-05-15 NOTE — Telephone Encounter (Signed)
Patient Information:  Caller Name: [name]  Phone: [phone]  Patient: [name] [name]  Gender: Female  DOB: 01-01-1935  Age: 77 Years  PCP: Ronna Polio (Adults only)  Office Follow Up:  Does the office need to follow up with this patient?: Yes  Instructions For The Office: Patient has no way to come to appt. in the office would olike Rx called in.  PLEASE F/U WITH PATIENT.  THANK YOU.  RN Note:  Patient states she lives alone and does not have anyone to bring her to the office for appt. today.  Please f/u with patient for appt. needs.  Symptoms  Reason For Call & Symptoms: Cough and congestion  Reviewed Health History In EMR: Yes  Reviewed Medications In EMR: Yes  Reviewed Allergies In EMR: Yes  Reviewed Surgeries / Procedures: Yes  Date of Onset of Symptoms: 05/14/2012  Guideline(s) Used:  Cough  Disposition Per Guideline:   Go to Office Now  Reason For Disposition Reached:   Wheezing is present  Advice Given:  Reassurance  Coughing is the way that our lungs remove irritants and mucus. It helps protect our lungs from getting pneumonia.  You can also get a cough after being exposed to irritating substances like smoke, strong perfumes, and dust.  Patient Refused Recommendation:  Patient Requests Prescription  Patient states she does not have anyone to bring her to the office per disposition.  Would like Rx called in.

## 2012-05-15 NOTE — Telephone Encounter (Signed)
Needs to be evaluated prior to any Rx called in. If cannot come in today, will need to go to urgent care over weekend. May also want to try Robitussin DM for cough which is OTC

## 2012-05-15 NOTE — Telephone Encounter (Signed)
Called at 2pm on 12/27 to advise. Could not LM due to no voicemail set up.

## 2012-05-21 ENCOUNTER — Ambulatory Visit (INDEPENDENT_AMBULATORY_CARE_PROVIDER_SITE_OTHER)
Admission: RE | Admit: 2012-05-21 | Discharge: 2012-05-21 | Disposition: A | Discharge status: Home / Self Care | Payer: Medicare Other | Source: Ambulatory Visit | Attending: Adult Health | Admitting: Adult Health

## 2012-05-21 ENCOUNTER — Telehealth: Payer: Self-pay | Admitting: *Deleted

## 2012-05-21 ENCOUNTER — Ambulatory Visit (INDEPENDENT_AMBULATORY_CARE_PROVIDER_SITE_OTHER): Payer: Medicare Other | Admitting: Adult Health

## 2012-05-21 ENCOUNTER — Encounter: Payer: Self-pay | Admitting: Adult Health

## 2012-05-21 VITALS — BP 190/95 | HR 74 | Temp 98.4°F | Ht 62.0 in | Wt 216.0 lb

## 2012-05-21 DIAGNOSIS — I10 Essential (primary) hypertension: Secondary | ICD-10-CM

## 2012-05-21 DIAGNOSIS — R059 Cough, unspecified: Secondary | ICD-10-CM

## 2012-05-21 DIAGNOSIS — J189 Pneumonia, unspecified organism: Secondary | ICD-10-CM

## 2012-05-21 DIAGNOSIS — R05 Cough: Secondary | ICD-10-CM

## 2012-05-21 MED ORDER — LEVOFLOXACIN 750 MG PO TABS: ORAL_TABLET | ORAL | Status: DC

## 2012-05-21 MED ORDER — HYDROCODONE-HOMATROPINE 5-1.5 MG/5ML PO SYRP: 5.0000 mL | ORAL_SOLUTION | Freq: Three times a day (TID) | ORAL | Status: DC | PRN

## 2012-05-21 NOTE — Telephone Encounter (Signed)
Opened in error

## 2012-05-21 NOTE — Patient Instructions (Addendum)
  Please go to [facility] for your chest xray.  I suspect you may have pneumonia.  Start your antibiotic today and take with food.  You can take the Hycodan syrup for your cough. This can cause drowsiness.  Drink plenty of fluids to stay hydrated.

## 2012-05-21 NOTE — Progress Notes (Signed)
Subjective:    Patient ID: [name] [name], female    DOB: 03-27-1935, 78 y.o.   MRN: [id number]  HPI  Patient presents to clinic with productive cough of green phlegm, HA and chills since Christmas day. Denies fever. She has had a few episodes of N/V and diarrhea, although she reports this has subsided. Pt has tried some OTC cough medicine with little results. She is concerned that she may have pneumonia. She also reports that she had some Cipro 500 mg tablets and was cutting those in half and taking it once a day. She took this for 8 days.   Current Outpatient Prescriptions on File Prior to Visit  Medication Sig Dispense Refill  . amLODipine (NORVASC) 10 MG tablet Take 1 tablet (10 mg total) by mouth daily.  30 tablet  6  . aspirin 81 MG tablet Take 162 mg by mouth daily.       . cyclobenzaprine (FLEXERIL) 5 MG tablet Take 5 mg by mouth 3 (three) times daily as needed.      . furosemide (LASIX) 40 MG tablet Take 1 tablet (40 mg total) by mouth 2 (two) times daily.  60 tablet  6  . gabapentin (NEURONTIN) 100 MG capsule Take 1 capsule (100 mg total) by mouth 3 (three) times daily.  90 capsule  3  . HYDROcodone-acetaminophen (VICODIN) 5-500 MG per tablet Take 1 tablet by mouth 3 (three) times daily as needed.  90 tablet  3  . insulin glargine (LANTUS) 100 UNIT/ML injection Inject 50 Units into the skin at bedtime.  30 mL  3  . isosorbide mononitrate (ISMO,MONOKET) 10 MG tablet Take 1 tablet (10 mg total) by mouth 2 (two) times daily.  60 tablet  6  . latanoprost (XALATAN) 0.005 % ophthalmic solution       . levothyroxine (LEVOTHROID) 150 MCG tablet Take 1 tablet (150 mcg total) by mouth daily.  30 tablet  11  . lisinopril (PRINIVIL,ZESTRIL) 10 MG tablet Take 1 tablet (10 mg total) by mouth daily.  30 tablet  6  . meclizine (ANTIVERT) 25 MG tablet Take 1 tablet (25 mg total) by mouth daily. As needed for dizziness or nausea  90 tablet  3  . meloxicam (MOBIC) 7.5 MG tablet       . metoprolol  tartrate (LOPRESSOR) 25 MG tablet Take 1 tablet (25 mg total) by mouth 2 (two) times daily.  60 tablet  6  . omeprazole (PRILOSEC) 20 MG capsule Take 1 capsule (20 mg total) by mouth 2 (two) times daily.  180 capsule  3  . potassium chloride SA (K-DUR,KLOR-CON) 20 MEQ tablet Take 1 tablet (20 mEq total) by mouth 2 (two) times daily.  180 tablet  3  . simvastatin (ZOCOR) 40 MG tablet Take 1 tablet (40 mg total) by mouth at bedtime.  90 tablet  3  . traMADol (ULTRAM) 50 MG tablet Take 1 tablet (50 mg total) by mouth every 8 (eight) hours as needed for pain.  90 tablet  3  . fluconazole (DIFLUCAN) 150 MG tablet Take 1 tablet (150 mg total) by mouth once.  1 tablet  0  . metFORMIN (GLUCOPHAGE) 500 MG tablet Take 1 tablet (500 mg total) by mouth 2 (two) times daily.  180 tablet  3  . nitroGLYCERIN (NITROSTAT) 0.4 MG SL tablet Place 1 tablet (0.4 mg total) under the tongue every 5 (five) minutes as needed.  25 tablet  3  . nystatin-triamcinolone (MYCOLOG II) cream Apply 1 application  topically 2 (two) times daily as needed.  30 g  1  . nystatin-triamcinolone ointment (MYCOLOG) Apply topically 2 (two) times daily.  30 g  0     Review of Systems  Constitutional: Positive for chills and fatigue. Negative for fever.  HENT: Positive for congestion, rhinorrhea and postnasal drip. Negative for sore throat.   Eyes: Negative.   Respiratory: Positive for cough, chest tightness, shortness of breath and wheezing.   Cardiovascular: Negative for chest pain.  Gastrointestinal: Negative for nausea, vomiting and diarrhea.  Genitourinary: Negative.   Neurological: Positive for dizziness and light-headedness.  Psychiatric/Behavioral: Negative.     BP 190/95  Pulse 74  Temp 98.4 F (36.9 C) (Oral)  Ht 5\' 2"  (1.575 m)  Wt 216 lb (97.977 kg)  BMI 39.51 kg/m2  SpO2 96%     Objective:   Physical Exam  Constitutional: She is oriented to person, place, and time. She appears well-developed and well-nourished.    Cardiovascular: Normal rate, regular rhythm and normal heart sounds.   Pulmonary/Chest: Effort normal. No respiratory distress. She has no rales.       Positive for rhonchi bilateral upper lobes. Does not clear with coughing. Diminished air movement bilateral lower lobes.  Abdominal: Soft. Bowel sounds are normal.  Neurological: She is alert and oriented to person, place, and time.  Skin: Skin is warm and dry. No rash noted.  Psychiatric: She has a normal mood and affect. Her behavior is normal. Thought content normal.           Assessment & Plan:

## 2012-05-21 NOTE — Assessment & Plan Note (Signed)
Patient is hypertensive during visit. She reports she did not take her medicine today. Instructed to take her medicine as soon as she gets home. She has a B/P machine and she will recheck and call in the morning with result.

## 2012-05-21 NOTE — Assessment & Plan Note (Signed)
Coarse lung sounds that do not clear with coughing and decreased air movement at bases bilaterally. Suspect pneumonia. Chest xray ordered. Start Levaquin. Kidney function recently checked and is normal.

## 2012-05-22 ENCOUNTER — Telehealth: Payer: Self-pay | Admitting: Internal Medicine

## 2012-05-22 NOTE — Telephone Encounter (Signed)
Pt forgot to take b/p this morning she just took it and it was 134/58 and pulse is 73. She was told to call in and give you these readings.

## 2012-06-01 ENCOUNTER — Other Ambulatory Visit: Payer: Self-pay | Admitting: Internal Medicine

## 2012-06-03 ENCOUNTER — Ambulatory Visit: Payer: Medicare Other | Admitting: Internal Medicine

## 2012-06-05 ENCOUNTER — Ambulatory Visit (INDEPENDENT_AMBULATORY_CARE_PROVIDER_SITE_OTHER): Payer: Medicare Other | Admitting: Adult Health

## 2012-06-05 ENCOUNTER — Ambulatory Visit: Payer: Medicare Other | Admitting: Adult Health

## 2012-06-05 ENCOUNTER — Encounter: Payer: Self-pay | Admitting: Adult Health

## 2012-06-05 VITALS — BP 185/81 | HR 72 | Temp 98.3°F | Resp 18 | Ht 64.0 in | Wt 223.0 lb

## 2012-06-05 DIAGNOSIS — B3731 Acute candidiasis of vulva and vagina: Secondary | ICD-10-CM

## 2012-06-05 DIAGNOSIS — B373 Candidiasis of vulva and vagina: Secondary | ICD-10-CM

## 2012-06-05 DIAGNOSIS — J189 Pneumonia, unspecified organism: Secondary | ICD-10-CM

## 2012-06-05 MED ORDER — FLUTICASONE PROPIONATE 50 MCG/ACT NA SUSP: 2.0000 | Freq: Every day | NASAL | Status: DC

## 2012-06-05 MED ORDER — FLUCONAZOLE 150 MG PO TABS: ORAL_TABLET | ORAL | Status: DC

## 2012-06-05 MED ORDER — LEVOFLOXACIN 750 MG PO TABS: ORAL_TABLET | ORAL | Status: DC

## 2012-06-05 NOTE — Progress Notes (Signed)
Subjective:    Patient ID: [name] [name], female    DOB: Sep 17, 1934, 78 y.o.   MRN: [id number]  HPI  Patient is a pleasant 78 y/o female who presents today for f/u visit. She was last seen in clinic on 05/21/12 for suspected pneumonia, although, chest xray did not show this. Patient was started on antibiotic and was asked to return in 2 weeks. Patient states she is feeling much better but feels her lungs have not completely healed. She reports completing course of antibiotic. Patient still has a cough and congestion but overall feels much improved. She has been getting around and has been very active. She has driven herself here today.  Patient reports that she has a yeast infection secondary to antibiotic.   Current Outpatient Prescriptions on File Prior to Visit  Medication Sig Dispense Refill  . amLODipine (NORVASC) 10 MG tablet Take 1 tablet (10 mg total) by mouth daily.  30 tablet  6  . aspirin 81 MG tablet Take 162 mg by mouth daily.       . cyclobenzaprine (FLEXERIL) 5 MG tablet Take 5 mg by mouth 3 (three) times daily as needed.      . furosemide (LASIX) 40 MG tablet Take 1 tablet (40 mg total) by mouth 2 (two) times daily.  60 tablet  6  . gabapentin (NEURONTIN) 100 MG capsule Take 1 capsule (100 mg total) by mouth 3 (three) times daily.  90 capsule  3  . HYDROcodone-acetaminophen (VICODIN) 5-500 MG per tablet Take 1 tablet by mouth 3 (three) times daily as needed.  90 tablet  3  . insulin glargine (LANTUS) 100 UNIT/ML injection Inject 50 Units into the skin at bedtime.  30 mL  3  . isosorbide mononitrate (ISMO,MONOKET) 10 MG tablet Take 1 tablet (10 mg total) by mouth 2 (two) times daily.  60 tablet  6  . latanoprost (XALATAN) 0.005 % ophthalmic solution       . levothyroxine (LEVOTHROID) 150 MCG tablet Take 1 tablet (150 mcg total) by mouth daily.  30 tablet  11  . lisinopril (PRINIVIL,ZESTRIL) 10 MG tablet Take 1 tablet (10 mg total) by mouth daily.  30 tablet  6  . meclizine  (ANTIVERT) 25 MG tablet Take 1 tablet (25 mg total) by mouth daily. As needed for dizziness or nausea  90 tablet  3  . meloxicam (MOBIC) 7.5 MG tablet       . metFORMIN (GLUCOPHAGE) 500 MG tablet Take 1 tablet (500 mg total) by mouth 2 (two) times daily.  180 tablet  3  . metoprolol tartrate (LOPRESSOR) 25 MG tablet Take 1 tablet (25 mg total) by mouth 2 (two) times daily.  60 tablet  6  . nitroGLYCERIN (NITROSTAT) 0.4 MG SL tablet Place 1 tablet (0.4 mg total) under the tongue every 5 (five) minutes as needed.  25 tablet  3  . NOVOLIN R RELION 100 UNIT/ML injection INJECT  SUBCUTANEOUSLY 4  UNITS  IF  BLOOD  SUGAR  IS  GREATER  THAN  250  10 mL  2  . nystatin-triamcinolone (MYCOLOG II) cream Apply 1 application topically 2 (two) times daily as needed.  30 g  1  . nystatin-triamcinolone ointment (MYCOLOG) Apply topically 2 (two) times daily.  30 g  0  . omeprazole (PRILOSEC) 20 MG capsule Take 1 capsule (20 mg total) by mouth 2 (two) times daily.  180 capsule  3  . potassium chloride SA (K-DUR,KLOR-CON) 20 MEQ tablet Take 1 tablet (  20 mEq total) by mouth 2 (two) times daily.  180 tablet  3  . simvastatin (ZOCOR) 40 MG tablet Take 1 tablet (40 mg total) by mouth at bedtime.  90 tablet  3  . traMADol (ULTRAM) 50 MG tablet Take 1 tablet (50 mg total) by mouth every 8 (eight) hours as needed for pain.  90 tablet  3  . fluticasone (FLONASE) 50 MCG/ACT nasal spray Place 2 sprays into the nose daily.  16 g  6  . HYDROcodone-homatropine (HYCODAN) 5-1.5 MG/5ML syrup Take 5 mLs by mouth every 8 (eight) hours as needed for cough.  120 mL  0     Review of Systems  Constitutional: Negative for fever, chills and appetite change.  HENT: Positive for congestion and postnasal drip. Negative for sore throat and sinus pressure.   Respiratory: Positive for cough. Negative for shortness of breath.   Cardiovascular: Negative for chest pain and palpitations.  Gastrointestinal: Negative.   Genitourinary: Negative.     Musculoskeletal: Negative.   Psychiatric/Behavioral: Negative.         Objective:   Physical Exam  Constitutional: She is oriented to person, place, and time. She appears well-developed and well-nourished. No distress.  HENT:  Head: Normocephalic and atraumatic.  Right Ear: External ear normal.  Left Ear: External ear normal.  Nose: Nose normal.  Mouth/Throat: Oropharynx is clear and moist. No oropharyngeal exudate.  Eyes: Conjunctivae normal are normal. No scleral icterus.  Neck: No tracheal deviation present.  Cardiovascular: Normal rate, regular rhythm and normal heart sounds.  Exam reveals no gallop.   No murmur heard. Pulmonary/Chest: Effort normal. She has wheezes. She has no rales.       Scattered rhonchi; clear with cough. Exp wheezing bil upper lobes posteriorly  Abdominal: Soft. Bowel sounds are normal.  Lymphadenopathy:    She has no cervical adenopathy.  Neurological: She is alert and oriented to person, place, and time.  Skin: Skin is warm and dry.  Psychiatric: She has a normal mood and affect. Her behavior is normal. Judgment and thought content normal.          Assessment & Plan:

## 2012-06-05 NOTE — Patient Instructions (Addendum)
  We will do an antibiotic for 5 more days.  Diflucan for yeast infection. Take one today and then if symptoms do not improve take another tablet in 3 days.  Flonase nasal spray daily to help with inflammation.  Call if your symptoms are not better when you finish this round of antibiotics.

## 2012-06-07 ENCOUNTER — Encounter: Payer: Self-pay | Admitting: Adult Health

## 2012-06-07 DIAGNOSIS — B373 Candidiasis of vulva and vagina: Secondary | ICD-10-CM | POA: Insufficient documentation

## 2012-06-07 DIAGNOSIS — B3731 Acute candidiasis of vulva and vagina: Secondary | ICD-10-CM | POA: Insufficient documentation

## 2012-06-07 NOTE — Assessment & Plan Note (Signed)
Completed course of antibiotic. Symptoms greatly improved but still with some cough and exp. Wheezing. Will do second course of Levaquin. Continue flonase.

## 2012-06-07 NOTE — Assessment & Plan Note (Signed)
Developed yeast with antibiotic. Diflucan prescribed.

## 2012-06-11 ENCOUNTER — Other Ambulatory Visit: Payer: Self-pay | Admitting: Internal Medicine

## 2012-06-11 DIAGNOSIS — R42 Dizziness and giddiness: Secondary | ICD-10-CM

## 2012-06-11 DIAGNOSIS — G629 Polyneuropathy, unspecified: Secondary | ICD-10-CM

## 2012-06-11 DIAGNOSIS — I10 Essential (primary) hypertension: Secondary | ICD-10-CM

## 2012-06-11 DIAGNOSIS — E039 Hypothyroidism, unspecified: Secondary | ICD-10-CM

## 2012-06-11 NOTE — Telephone Encounter (Signed)
Pt called..her mail order pharmacy [facility]  The phone # [phone]   Fax# [phone]  Pt wanted to know if you could sent rx to them.  All but her insulin she gets this at a local pharmacy.

## 2012-06-12 MED ORDER — FUROSEMIDE 40 MG PO TABS: 40.0000 mg | ORAL_TABLET | Freq: Two times a day (BID) | ORAL | Status: DC

## 2012-06-12 MED ORDER — METFORMIN HCL 500 MG PO TABS: 500.0000 mg | ORAL_TABLET | Freq: Two times a day (BID) | ORAL | Status: DC

## 2012-06-12 MED ORDER — GABAPENTIN 100 MG PO CAPS: 100.0000 mg | ORAL_CAPSULE | Freq: Three times a day (TID) | ORAL | Status: DC

## 2012-06-12 MED ORDER — SIMVASTATIN 40 MG PO TABS: 40.0000 mg | ORAL_TABLET | Freq: Every day | ORAL | Status: DC

## 2012-06-12 MED ORDER — LISINOPRIL 10 MG PO TABS: 10.0000 mg | ORAL_TABLET | Freq: Every day | ORAL | Status: DC

## 2012-06-12 MED ORDER — ISOSORBIDE MONONITRATE 10 MG PO TABS: 10.0000 mg | ORAL_TABLET | Freq: Two times a day (BID) | ORAL | Status: DC

## 2012-06-12 MED ORDER — LEVOTHYROXINE SODIUM 150 MCG PO TABS: 150.0000 ug | ORAL_TABLET | Freq: Every day | ORAL | Status: DC

## 2012-06-12 MED ORDER — POTASSIUM CHLORIDE CRYS ER 20 MEQ PO TBCR: 20.0000 meq | EXTENDED_RELEASE_TABLET | Freq: Two times a day (BID) | ORAL | Status: DC

## 2012-06-12 MED ORDER — METOPROLOL TARTRATE 25 MG PO TABS: 25.0000 mg | ORAL_TABLET | Freq: Two times a day (BID) | ORAL | Status: DC

## 2012-06-12 MED ORDER — AMLODIPINE BESYLATE 10 MG PO TABS: 10.0000 mg | ORAL_TABLET | Freq: Every day | ORAL | Status: DC

## 2012-06-12 MED ORDER — MECLIZINE HCL 25 MG PO TABS: 25.0000 mg | ORAL_TABLET | Freq: Every day | ORAL | Status: DC

## 2012-06-12 MED ORDER — FLUTICASONE PROPIONATE 50 MCG/ACT NA SUSP: 2.0000 | Freq: Every day | NASAL | Status: DC

## 2012-06-12 NOTE — Telephone Encounter (Signed)
Pt called back and clarified pharmacy is now [facility]. Told pt okay will send Rx's there for her. Pt verbalized understanding. Rx's sent to [facility] all 90 day supply.

## 2012-06-15 ENCOUNTER — Telehealth: Payer: Self-pay | Admitting: *Deleted

## 2012-06-15 DIAGNOSIS — K219 Gastro-esophageal reflux disease without esophagitis: Secondary | ICD-10-CM

## 2012-06-15 MED ORDER — OMEPRAZOLE 20 MG PO CPDR: 20.0000 mg | DELAYED_RELEASE_CAPSULE | Freq: Two times a day (BID) | ORAL | Status: DC

## 2012-06-15 NOTE — Telephone Encounter (Signed)
Med filled.  

## 2012-06-15 NOTE — Telephone Encounter (Signed)
Rx request:  Omeprazole cap   20mg   1 PO BID  Quantity: 90 days supply

## 2012-06-25 ENCOUNTER — Telehealth: Payer: Self-pay | Admitting: Internal Medicine

## 2012-06-25 DIAGNOSIS — E039 Hypothyroidism, unspecified: Secondary | ICD-10-CM

## 2012-06-25 NOTE — Telephone Encounter (Signed)
Pt states insurance Herbalist) is needing an explanation for why the patient needs insulin R.  Pt asking for a return call so she can speak with the nurse about this.  Please advise.

## 2012-06-26 NOTE — Telephone Encounter (Signed)
Tried calling patient, phone just rang, no answering machine, will try again later

## 2012-06-29 NOTE — Telephone Encounter (Signed)
Called [facility], as of 05/20/2012 Novolin R is not covered. She will fax form to be completed and faxed back.

## 2012-06-29 NOTE — Telephone Encounter (Signed)
The insurance company wants to know why she is using insulin, a prior autho is needed. [facility] is stating [employer] will not cover unless PA is done.

## 2012-06-30 NOTE — Telephone Encounter (Signed)
Patient needing her Levothyroxine filled also.

## 2012-07-08 ENCOUNTER — Telehealth: Payer: Self-pay | Admitting: *Deleted

## 2012-07-08 NOTE — Telephone Encounter (Signed)
Received a notice of approval for the Novolin R, fax to the pharmacy ([facility])

## 2012-07-09 MED ORDER — LEVOTHYROXINE SODIUM 150 MCG PO TABS: 150.0000 ug | ORAL_TABLET | Freq: Every day | ORAL | Status: DC

## 2012-07-09 NOTE — Telephone Encounter (Signed)
Rx sent 

## 2012-07-17 LAB — HM DIABETES EYE EXAM

## 2012-08-03 ENCOUNTER — Telehealth: Payer: Self-pay | Admitting: Internal Medicine

## 2012-08-03 NOTE — Telephone Encounter (Signed)
Pt asking to have medication called in.  Has chest pain and afraid she is going to get pneumonia again.  Has been taking sinus headache tablets from [employer] but that is not helping at all and is all she has to take.  Pt also having shoulder pain and asking for pain medication.  States she cannot get out and drive in this weather.  Please call.  Transferred to triage line.

## 2012-08-03 NOTE — Telephone Encounter (Signed)
Needs appointment to be seen

## 2012-08-03 NOTE — Telephone Encounter (Signed)
Okey Regal, Didn't you offer her an appointment and she declined? She needs to be seen prior to calling in any meds.

## 2012-08-03 NOTE — Telephone Encounter (Signed)
Patient Information:  Caller Name: Veronica  Phone: [phone]  Patient: [name] [name]  Gender: Female  DOB: [birth year]  Age: 78 Years  PCP: Ronna Polio (Adults only)  Office Follow Up:  Does the office need to follow up with this patient?: Yes  Instructions For The Office: Please review and contact patient  [phone].  RN Note:  Patient declinig Appt  Symptoms  Reason For Call & Symptoms: Visiting in son's home, has smoke in home and started congestion head Friday 3/7, returning home Tues 3/11.  Now drainage down back of throat "getting into lungs", denies cough.  Thinks mucus is colored but does not know what color.   Also thinks she pulled muscle in Left shoulder while changing bed 2 weeks ago, constant pain, limited range of motion, can only lift arm but about half way up and not over head or behind back.    Reviewed Health History In EMR: Yes  Reviewed Medications In EMR: Yes  Reviewed Allergies In EMR: Yes  Reviewed Surgeries / Procedures: Yes  Date of Onset of Symptoms: 07/24/2012  Treatments Tried: pill for nasal congestion that she got [facility]  Treatments Tried Worked: No  Guideline(s) Used:  Colds  Shoulder Pain  Disposition Per Guideline:   See Today or Tomorrow in Office  Reason For Disposition Reached:   Sinus congestion (pressure, fullness) present > 10 days  Advice Given:  Call Back If  You become worse.  Patient Refused Recommendation:  Patient Requests Prescription  Patient declining Appt, wanting medication called in.  Uses [facility]  [phone]

## 2012-08-03 NOTE — Telephone Encounter (Signed)
Had pneumonia in January and has been to her son house where they smoke. Not sure if this may be any relation but she has that stuff again all down in her chest. Head has been all stopped now it is all down in her chest, when she get sick she need abx because the stuff OTC does not do her any good. Does not have a cough, per patient she does have nasal discharge but does not know what color it be. All she know it has gotten down in her chest. Also was making up her bed and feels like she may have pulled a muscle in her left shoulder.

## 2012-08-03 NOTE — Telephone Encounter (Signed)
Patient informed and she declined scheduling an appointment today, will call back

## 2012-08-04 NOTE — Telephone Encounter (Signed)
Called patient again this morning but no answer or voicemail to leave message

## 2012-08-04 NOTE — Telephone Encounter (Signed)
Can she see another provider? Or we can add her next Tuesday afternoon. Will need to ask Carollee Herter about that schedule.

## 2012-08-04 NOTE — Telephone Encounter (Signed)
Patient called back and insisted something be called in for her, I explained to her we can not call in antibitotics without being seen. Patient finally agreed to be come in, do not see anything with you tomorrow and she do not want to come early in the morning. Please give me any suggestions.

## 2012-08-04 NOTE — Telephone Encounter (Signed)
Offered patient an appointment with NP tomorrow she declined and stated she wanted to wait to see Dr. Dan Humphreys on Friday. Appointment scheduled for Friday with Dr. Dan Humphreys.

## 2012-08-07 ENCOUNTER — Ambulatory Visit (INDEPENDENT_AMBULATORY_CARE_PROVIDER_SITE_OTHER): Payer: Medicare Other | Admitting: Internal Medicine

## 2012-08-07 ENCOUNTER — Encounter: Payer: Self-pay | Admitting: Internal Medicine

## 2012-08-07 VITALS — BP 150/70 | HR 72 | Temp 98.2°F | Wt 222.0 lb

## 2012-08-07 DIAGNOSIS — M25512 Pain in left shoulder: Secondary | ICD-10-CM

## 2012-08-07 DIAGNOSIS — J0100 Acute maxillary sinusitis, unspecified: Secondary | ICD-10-CM

## 2012-08-07 DIAGNOSIS — M25519 Pain in unspecified shoulder: Secondary | ICD-10-CM

## 2012-08-07 MED ORDER — AMOXICILLIN-POT CLAVULANATE 875-125 MG PO TABS: 1.0000 | ORAL_TABLET | Freq: Two times a day (BID) | ORAL | Status: DC

## 2012-08-07 MED ORDER — HYDROCODONE-ACETAMINOPHEN 5-325 MG PO TABS: 1.0000 | ORAL_TABLET | Freq: Four times a day (QID) | ORAL | Status: DC | PRN

## 2012-08-07 NOTE — Assessment & Plan Note (Signed)
Symptoms and exam are consistent with acute maxillary sinusitis. Will treat with Augmentin. Patient will call or return to clinic if symptoms are not and over the next 72 hours.

## 2012-08-07 NOTE — Progress Notes (Signed)
Subjective:    Patient ID: [name] [name], female    DOB: 01-26-1935, 78 y.o.   MRN: [id number]  HPI 78 year old female with history of diabetes, hypertension presents for acute visit complaining of one-week history of sinus congestion, bilateral ear pain, cough productive of purulent sputum. She denies shortness of breath, chest pain, fever, chills. Symptoms first began when she stated her son's house. She reports that he smokes and is often trigger sinus infections for her.  She is also concerned about several days of left shoulder pain and weakness with movement of her left arm. She reports that she was putting a new ruffle on her bed and she developed sudden pain in her left shoulder. She also notes some anterior swelling in her left shoulder. She describes this as painful. The pain does not radiate. She is not currently taking anything for pain except for meloxicam with minimal improvement.  Outpatient Encounter Prescriptions as of 08/07/2012  Medication Sig Dispense Refill  . amLODipine (NORVASC) 10 MG tablet Take 1 tablet (10 mg total) by mouth daily.  90 tablet  3  . aspirin 81 MG tablet Take 162 mg by mouth daily.       . cyclobenzaprine (FLEXERIL) 5 MG tablet Take 5 mg by mouth 3 (three) times daily as needed.      . fluticasone (FLONASE) 50 MCG/ACT nasal spray Place 2 sprays into the nose daily.  16 g  3  . furosemide (LASIX) 40 MG tablet Take 1 tablet (40 mg total) by mouth 2 (two) times daily.  180 tablet  3  . gabapentin (NEURONTIN) 100 MG capsule Take 1 capsule (100 mg total) by mouth 3 (three) times daily.  270 capsule  3  . insulin glargine (LANTUS) 100 UNIT/ML injection Inject 50 Units into the skin at bedtime.  30 mL  3  . isosorbide mononitrate (ISMO,MONOKET) 10 MG tablet Take 1 tablet (10 mg total) by mouth 2 (two) times daily.  180 tablet  3  . latanoprost (XALATAN) 0.005 % ophthalmic solution       . lisinopril (PRINIVIL,ZESTRIL) 10 MG tablet Take 1 tablet (10 mg total) by  mouth daily.  90 tablet  3  . meclizine (ANTIVERT) 25 MG tablet Take 1 tablet (25 mg total) by mouth daily. As needed for dizziness or nausea  90 tablet  3  . meloxicam (MOBIC) 7.5 MG tablet       . metFORMIN (GLUCOPHAGE) 500 MG tablet Take 1 tablet (500 mg total) by mouth 2 (two) times daily.  180 tablet  3  . metoprolol tartrate (LOPRESSOR) 25 MG tablet Take 1 tablet (25 mg total) by mouth 2 (two) times daily.  180 tablet  3  . nitroGLYCERIN (NITROSTAT) 0.4 MG SL tablet Place 1 tablet (0.4 mg total) under the tongue every 5 (five) minutes as needed.  25 tablet  3  . NOVOLIN R RELION 100 UNIT/ML injection INJECT  SUBCUTANEOUSLY 4  UNITS  IF  BLOOD  SUGAR  IS  GREATER  THAN  250  10 mL  2  . nystatin-triamcinolone (MYCOLOG II) cream Apply 1 application topically 2 (two) times daily as needed.  30 g  1  . nystatin-triamcinolone ointment (MYCOLOG) Apply topically 2 (two) times daily.  30 g  0  . omeprazole (PRILOSEC) 20 MG capsule Take 1 capsule (20 mg total) by mouth 2 (two) times daily.  180 capsule  3  . potassium chloride SA (K-DUR,KLOR-CON) 20 MEQ tablet Take 1 tablet (20 mEq  total) by mouth 2 (two) times daily.  180 tablet  3  . simvastatin (ZOCOR) 40 MG tablet Take 1 tablet (40 mg total) by mouth at bedtime.  90 tablet  3  . traMADol (ULTRAM) 50 MG tablet Take 1 tablet (50 mg total) by mouth every 8 (eight) hours as needed for pain.  90 tablet  3  . amoxicillin-clavulanate (AUGMENTIN) 875-125 MG per tablet Take 1 tablet by mouth 2 (two) times daily.  20 tablet  0  . fluconazole (DIFLUCAN) 150 MG tablet Take one tablet today. If symptoms do not improve then take the second tablet.  2 tablet  0  . HYDROcodone-acetaminophen (NORCO/VICODIN) 5-325 MG per tablet Take 1 tablet by mouth every 6 (six) hours as needed for pain.  30 tablet  0  . levothyroxine (LEVOTHROID) 150 MCG tablet Take 1 tablet (150 mcg total) by mouth daily.  90 tablet  3   No facility-administered encounter medications on file as  of 08/07/2012.   BP 150/70  Pulse 72  Temp(Src) 98.2 F (36.8 C) (Oral)  Wt 222 lb (100.699 kg)  BMI 38.09 kg/m2  SpO2 96%  Review of Systems  Constitutional: Negative for fever, chills, appetite change, fatigue and unexpected weight change.  HENT: Positive for ear pain, congestion, postnasal drip and sinus pressure. Negative for sore throat, trouble swallowing, neck pain and voice change.   Eyes: Negative for visual disturbance.  Respiratory: Positive for cough. Negative for shortness of breath, wheezing and stridor.   Cardiovascular: Negative for chest pain, palpitations and leg swelling.  Gastrointestinal: Negative for nausea, vomiting, abdominal pain, diarrhea, constipation, blood in stool, abdominal distention and anal bleeding.  Genitourinary: Negative for dysuria and flank pain.  Musculoskeletal: Positive for myalgias and arthralgias. Negative for gait problem.  Skin: Negative for color change and rash.  Neurological: Positive for weakness. Negative for dizziness and headaches.  Hematological: Negative for adenopathy. Does not bruise/bleed easily.  Psychiatric/Behavioral: Negative for suicidal ideas, sleep disturbance and dysphoric mood. The patient is not nervous/anxious.        Objective:   Physical Exam  Constitutional: She is oriented to person, place, and time. She appears well-developed and well-nourished. No distress.  HENT:  Head: Normocephalic and atraumatic.  Right Ear: External ear normal. Tympanic membrane is bulging. A middle ear effusion is present.  Left Ear: External ear normal. Tympanic membrane is bulging. A middle ear effusion is present.  Nose: Mucosal edema and sinus tenderness present.  Mouth/Throat: Oropharynx is clear and moist. No oropharyngeal exudate or posterior oropharyngeal erythema.  Eyes: Conjunctivae are normal. Pupils are equal, round, and reactive to light. Right eye exhibits no discharge. Left eye exhibits no discharge. No scleral icterus.   Neck: Normal range of motion. Neck supple. No tracheal deviation present. No thyromegaly present.  Cardiovascular: Normal rate, regular rhythm, normal heart sounds and intact distal pulses.  Exam reveals no gallop and no friction rub.   No murmur heard. Pulmonary/Chest: Effort normal and breath sounds normal. No accessory muscle usage. Not tachypneic. No respiratory distress. She has no decreased breath sounds. She has no wheezes. She has no rhonchi. She has no rales. She exhibits no tenderness.  Musculoskeletal: She exhibits no edema and no tenderness.       Left shoulder: She exhibits decreased range of motion, tenderness, bony tenderness, swelling, pain and decreased strength (abduction).  Lymphadenopathy:    She has no cervical adenopathy.  Neurological: She is alert and oriented to person, place, and time. No cranial  nerve deficit. She exhibits normal muscle tone. Coordination normal.  Skin: Skin is warm and dry. No rash noted. She is not diaphoretic. No erythema. No pallor.  Psychiatric: She has a normal mood and affect. Her behavior is normal. Judgment and thought content normal.          Assessment & Plan:

## 2012-08-07 NOTE — Assessment & Plan Note (Signed)
Left shoulder pain and weakness of left arm abduction after injury are most consistent with rotator cuff tear. Discussed scheduling MRI evaluation and orthopedic evaluation. Patient prefers to set up her own appointment with her orthopedic next week. Will use hydrocodone prn pain in short term.

## 2012-08-17 ENCOUNTER — Telehealth: Payer: Self-pay | Admitting: Internal Medicine

## 2012-08-17 NOTE — Telephone Encounter (Signed)
Amber, Can you look into this?

## 2012-08-17 NOTE — Telephone Encounter (Signed)
Please read below...

## 2012-08-17 NOTE — Telephone Encounter (Signed)
Pt missed her appt for pain management and the office she was scheduled at told her to call us and get another appt scheduled ???

## 2012-08-28 NOTE — Telephone Encounter (Signed)
Amber, has this been taken care of?

## 2012-08-28 NOTE — Telephone Encounter (Signed)
Yes, they gave her 1 extra chance before she is d/c'd. Dan Humphreys is aware.

## 2012-09-14 ENCOUNTER — Ambulatory Visit: Payer: Self-pay | Admitting: Pain Medicine

## 2012-09-16 ENCOUNTER — Telehealth: Payer: Self-pay | Admitting: Internal Medicine

## 2012-09-16 NOTE — Telephone Encounter (Signed)
She will need to get pain meds from the pain clinic.

## 2012-09-16 NOTE — Telephone Encounter (Signed)
Patient is calling stating she has seen the pain clinic and they need her to have a MRI. The patient was wondering if you will call her in some pain meds for her. Please advise.

## 2012-09-16 NOTE — Telephone Encounter (Signed)
She will need to be seen then if severe pain.

## 2012-09-16 NOTE — Telephone Encounter (Signed)
Informed patient to get the medication from pain clinic, per patient they would not give her anything.

## 2012-09-17 NOTE — Telephone Encounter (Signed)
Spoke with patient, she has already been scheduled for an appointment on Monday with Dr. Dan Humphreys. Stated she has Tylenol at home but she can not take it because it messes up her stomach, but would like to know if some Tylenol #3 could be called in.

## 2012-09-17 NOTE — Telephone Encounter (Signed)
No. We cannot call in narcotics for chronic back pain. She will need to either be seen or follow at the pain clinic.

## 2012-09-17 NOTE — Telephone Encounter (Signed)
Patient informed and will wait for appointment next week.

## 2012-09-21 ENCOUNTER — Telehealth: Payer: Self-pay | Admitting: Internal Medicine

## 2012-09-21 ENCOUNTER — Encounter: Payer: Self-pay | Admitting: Internal Medicine

## 2012-09-21 ENCOUNTER — Ambulatory Visit (INDEPENDENT_AMBULATORY_CARE_PROVIDER_SITE_OTHER): Payer: Medicare Other | Admitting: Internal Medicine

## 2012-09-21 VITALS — BP 208/90 | HR 74 | Temp 98.6°F | Wt 220.0 lb

## 2012-09-21 DIAGNOSIS — M25512 Pain in left shoulder: Secondary | ICD-10-CM

## 2012-09-21 DIAGNOSIS — M25519 Pain in unspecified shoulder: Secondary | ICD-10-CM

## 2012-09-21 DIAGNOSIS — G8929 Other chronic pain: Secondary | ICD-10-CM

## 2012-09-21 DIAGNOSIS — M549 Dorsalgia, unspecified: Secondary | ICD-10-CM

## 2012-09-21 DIAGNOSIS — E1165 Type 2 diabetes mellitus with hyperglycemia: Secondary | ICD-10-CM

## 2012-09-21 DIAGNOSIS — L03319 Cellulitis of trunk, unspecified: Secondary | ICD-10-CM | POA: Insufficient documentation

## 2012-09-21 DIAGNOSIS — IMO0002 Reserved for concepts with insufficient information to code with codable children: Secondary | ICD-10-CM

## 2012-09-21 DIAGNOSIS — IMO0001 Reserved for inherently not codable concepts without codable children: Secondary | ICD-10-CM

## 2012-09-21 DIAGNOSIS — L02219 Cutaneous abscess of trunk, unspecified: Secondary | ICD-10-CM | POA: Insufficient documentation

## 2012-09-21 DIAGNOSIS — I10 Essential (primary) hypertension: Secondary | ICD-10-CM

## 2012-09-21 DIAGNOSIS — B379 Candidiasis, unspecified: Secondary | ICD-10-CM | POA: Insufficient documentation

## 2012-09-21 MED ORDER — HYDROCODONE-ACETAMINOPHEN 5-325 MG PO TABS: 1.0000 | ORAL_TABLET | Freq: Four times a day (QID) | ORAL | Status: DC | PRN

## 2012-09-21 MED ORDER — NYSTATIN 100000 UNIT/GM EX CREA: TOPICAL_CREAM | Freq: Two times a day (BID) | CUTANEOUS | Status: DC

## 2012-09-21 MED ORDER — GENTAMICIN SULFATE 0.1 % EX CREA: TOPICAL_CREAM | Freq: Three times a day (TID) | CUTANEOUS | Status: DC

## 2012-09-21 MED ORDER — CYCLOBENZAPRINE HCL 5 MG PO TABS: 5.0000 mg | ORAL_TABLET | Freq: Three times a day (TID) | ORAL | Status: DC | PRN

## 2012-09-21 MED ORDER — ISOSORBIDE MONONITRATE 10 MG PO TABS: 10.0000 mg | ORAL_TABLET | Freq: Two times a day (BID) | ORAL | Status: DC

## 2012-09-21 NOTE — Progress Notes (Signed)
Subjective:    Patient ID: [name] [name], female    DOB: 05-18-1935, 78 y.o.   MRN: [id number]  HPI 78 year old female with history of diabetes, hypertension, chronic back pain secondary to degenerative arthritis presents for followup. She reports she is generally feeling well. She reports blood sugars have been well-controlled. She has been compliant with her medication. She had one low blood sugar over the last 3 months for which she was symptomatic with diaphoresis and nausea. This resolved by taking some orange juice. She denies blood sugars greater than 250.  She reports that blood pressures have been well-controlled at home., Typically 120s over 80s. She denies any chest pain, palpitations, headache. She is compliant with her medication.  She is concerned today about rash over her upper back described as itching. She denies use of any new soaps, lotions, perfumes. She denies any fever or chills. It has been present for several weeks. She has been applying a topical antibiotic ointment with some improvement.  She is also concerned about some redness in her bellybutton over the last several weeks. She describes a foul odor coming from this area. She has been applying baby powder with no improvement.  Outpatient Encounter Prescriptions as of 09/21/2012  Medication Sig Dispense Refill  . amLODipine (NORVASC) 10 MG tablet Take 1 tablet (10 mg total) by mouth daily.  90 tablet  3  . aspirin 81 MG tablet Take 162 mg by mouth daily.       . fluticasone (FLONASE) 50 MCG/ACT nasal spray Place 2 sprays into the nose daily.  16 g  3  . furosemide (LASIX) 40 MG tablet Take 1 tablet (40 mg total) by mouth 2 (two) times daily.  180 tablet  3  . gabapentin (NEURONTIN) 100 MG capsule Take 1 capsule (100 mg total) by mouth 3 (three) times daily.  270 capsule  3  . insulin glargine (LANTUS) 100 UNIT/ML injection Inject 50 Units into the skin at bedtime.  30 mL  3  . isosorbide mononitrate (ISMO,MONOKET) 10  MG tablet Take 1 tablet (10 mg total) by mouth 2 (two) times daily.  180 tablet  3  . latanoprost (XALATAN) 0.005 % ophthalmic solution       . levothyroxine (LEVOTHROID) 150 MCG tablet Take 1 tablet (150 mcg total) by mouth daily.  90 tablet  3  . lisinopril (PRINIVIL,ZESTRIL) 10 MG tablet Take 1 tablet (10 mg total) by mouth daily.  90 tablet  3  . meclizine (ANTIVERT) 25 MG tablet Take 1 tablet (25 mg total) by mouth daily. As needed for dizziness or nausea  90 tablet  3  . metFORMIN (GLUCOPHAGE) 500 MG tablet Take 1 tablet (500 mg total) by mouth 2 (two) times daily.  180 tablet  3  . metoprolol tartrate (LOPRESSOR) 25 MG tablet Take 1 tablet (25 mg total) by mouth 2 (two) times daily.  180 tablet  3  . nitroGLYCERIN (NITROSTAT) 0.4 MG SL tablet Place 1 tablet (0.4 mg total) under the tongue every 5 (five) minutes as needed.  25 tablet  3  . NOVOLIN R RELION 100 UNIT/ML injection INJECT  SUBCUTANEOUSLY 4  UNITS  IF  BLOOD  SUGAR  IS  GREATER  THAN  250  10 mL  2  . omeprazole (PRILOSEC) 20 MG capsule Take 1 capsule (20 mg total) by mouth 2 (two) times daily.  180 capsule  3  . potassium chloride SA (K-DUR,KLOR-CON) 20 MEQ tablet Take 1 tablet (20 mEq total)  by mouth 2 (two) times daily.  180 tablet  3  . simvastatin (ZOCOR) 40 MG tablet Take 1 tablet (40 mg total) by mouth at bedtime.  90 tablet  3  . traMADol (ULTRAM) 50 MG tablet Take 1 tablet (50 mg total) by mouth every 8 (eight) hours as needed for pain.  90 tablet  3  . [DISCONTINUED] isosorbide mononitrate (ISMO,MONOKET) 10 MG tablet Take 1 tablet (10 mg total) by mouth 2 (two) times daily.  180 tablet  3  . cyclobenzaprine (FLEXERIL) 5 MG tablet Take 1 tablet (5 mg total) by mouth 3 (three) times daily as needed.  90 tablet  1  . diazepam (VALIUM) 5 MG tablet       . fluconazole (DIFLUCAN) 150 MG tablet Take one tablet today. If symptoms do not improve then take the second tablet.  2 tablet  0  . gentamicin cream (GARAMYCIN) 0.1 % Apply  topically 3 (three) times daily.  15 g  0  . HYDROcodone-acetaminophen (NORCO/VICODIN) 5-325 MG per tablet Take 1 tablet by mouth every 6 (six) hours as needed for pain.  30 tablet  0  . levofloxacin (LEVAQUIN) 750 MG tablet Take 1 tablet daily for 5 days. Take with food.  5 tablet  0  . meloxicam (MOBIC) 7.5 MG tablet       . nystatin cream (MYCOSTATIN) Apply topically 2 (two) times daily.  30 g  0  . nystatin-triamcinolone (MYCOLOG II) cream Apply 1 application topically 2 (two) times daily as needed.  30 g  1  . nystatin-triamcinolone ointment (MYCOLOG) Apply topically 2 (two) times daily.  30 g  0   No facility-administered encounter medications on file as of 09/21/2012.   BP 208/90  Pulse 74  Temp(Src) 98.6 F (37 C) (Oral)  Wt 220 lb (99.791 kg)  BMI 37.74 kg/m2  SpO2 96%  Review of Systems  Constitutional: Negative for fever, chills, appetite change, fatigue and unexpected weight change.  HENT: Negative for ear pain, congestion, sore throat, trouble swallowing, neck pain, voice change and sinus pressure.   Eyes: Negative for visual disturbance.  Respiratory: Negative for cough, shortness of breath, wheezing and stridor.   Cardiovascular: Negative for chest pain, palpitations and leg swelling.  Gastrointestinal: Negative for nausea, vomiting, abdominal pain, diarrhea, constipation, blood in stool, abdominal distention and anal bleeding.  Genitourinary: Negative for dysuria and flank pain.  Musculoskeletal: Positive for myalgias, back pain and arthralgias. Negative for gait problem.  Skin: Positive for color change and wound. Negative for rash.  Neurological: Negative for dizziness and headaches.  Hematological: Negative for adenopathy. Does not bruise/bleed easily.  Psychiatric/Behavioral: Negative for suicidal ideas, sleep disturbance and dysphoric mood. The patient is not nervous/anxious.        Objective:   Physical Exam  Constitutional: She is oriented to person, place,  and time. She appears well-developed and well-nourished. No distress.  HENT:  Head: Normocephalic and atraumatic.  Right Ear: External ear normal.  Left Ear: External ear normal.  Nose: Nose normal.  Mouth/Throat: Oropharynx is clear and moist. No oropharyngeal exudate.  Eyes: Conjunctivae are normal. Pupils are equal, round, and reactive to light. Right eye exhibits no discharge. Left eye exhibits no discharge. No scleral icterus.  Neck: Normal range of motion. Neck supple. No tracheal deviation present. No thyromegaly present.  Cardiovascular: Normal rate, regular rhythm, normal heart sounds and intact distal pulses.  Exam reveals no gallop and no friction rub.   No murmur heard. Pulmonary/Chest: Effort  normal and breath sounds normal. No accessory muscle usage. Not tachypneic. No respiratory distress. She has no decreased breath sounds. She has no wheezes. She has no rhonchi. She has no rales. She exhibits no tenderness.  Musculoskeletal: Normal range of motion. She exhibits no edema and no tenderness.  Lymphadenopathy:    She has no cervical adenopathy.  Neurological: She is alert and oriented to person, place, and time. No cranial nerve deficit. She exhibits normal muscle tone. Coordination normal.  Skin: Skin is warm and dry. Rash noted. Rash is papular (with overlying excoriations over upper back, consistent with dermatitis with excoriations from  scratching). She is not diaphoretic. There is erythema (periumbilical with white patches consistent with candidiasis.). No pallor.  Psychiatric: She has a normal mood and affect. Her behavior is normal. Judgment and thought content normal.          Assessment & Plan:

## 2012-09-21 NOTE — Telephone Encounter (Signed)
Can you ask pt to monitor her BP at home and email with readings? She will need a nurse visit end of this week to recheck blood pressure and a 1 month follow up.

## 2012-09-21 NOTE — Assessment & Plan Note (Signed)
Umbilical rash most consistent with candidiasis. Will treat with topical nystatin. Pt will call if no improvement.

## 2012-09-21 NOTE — Assessment & Plan Note (Signed)
Rash over upper back most consistent with dermatitis, exacerbated by scratching. Lesions are crusted and appear to be healing with no surrounding induration or drainage. Given her h/o MRSA, will apply topical gentamicin. She will call if lesions do not continue to improve.

## 2012-09-21 NOTE — Assessment & Plan Note (Signed)
Symptoms are persistent. Patient reports that recent imaging showed potential fracture in the lumbar spine. Will request notes from the pain clinic. Will continue as needed hydrocodone for severe pain. Additional refills on Hydrocodone should be placed through pain clinic.

## 2012-09-21 NOTE — Assessment & Plan Note (Signed)
Patient reports good control of her blood sugars with current medications however did not bring record of blood sugars today. Will check A1c with labs. Followup 3 months.

## 2012-09-21 NOTE — Assessment & Plan Note (Signed)
Blood pressure markedly elevated today. However, patient reports blood pressure has been well-controlled at home. We'll have her return to clinic later this week for repeat check of her blood pressure. She will also record readings at home and bring with her. If persistently >150/90, consider increase lisinopril to 20mg  daily. Will check renal function with labs today.

## 2012-09-21 NOTE — Telephone Encounter (Signed)
Patient did not answer or have a voicemail to leave message

## 2012-09-21 NOTE — Assessment & Plan Note (Signed)
Persistent symptoms of left shoulder pain and decreased ROM. S/p recent evaluation at the pain clinic. Additional imaging pending. Will request notes on evaluation/plan.

## 2012-09-22 LAB — COMPREHENSIVE METABOLIC PANEL
ALT: 13 U/L (ref 0–35)
Albumin: 3.8 g/dL (ref 3.5–5.2)
Alkaline Phosphatase: 72 U/L (ref 39–117)
CO2: 25 mEq/L (ref 19–32)
GFR: 77.58 mL/min (ref >60.00)
Glucose, Bld: 145 mg/dL — ABNORMAL HIGH (ref 70–99)
Potassium: 4.2 mEq/L (ref 3.5–5.1)
Sodium: 138 mEq/L (ref 135–145)
Total Bilirubin: 0.8 mg/dL (ref 0.3–1.2)
Total Protein: 7.4 g/dL (ref 6.0–8.3)

## 2012-09-22 NOTE — Telephone Encounter (Signed)
Spoke with patient she state she is not sure if she will be able to come in the end of the week for a nurse visit, she will call back to schedule appointment. Also she has been monitoring her BP at home, but will not be able to email them to you because she does not have a computer. Informed her to bring them in with her when she come for her BP check.

## 2012-09-23 ENCOUNTER — Telehealth: Payer: Self-pay | Admitting: Emergency Medicine

## 2012-09-23 NOTE — Telephone Encounter (Signed)
Spoke with patient and informed her of lab results.

## 2012-09-23 NOTE — Telephone Encounter (Signed)
Pt calling again.  Pt states Okey Regal called her this morning and she has not been able to get back in touch with her.

## 2012-09-23 NOTE — Telephone Encounter (Signed)
Patient is returning your call. She would like for you to call her back and let the phone ring and ring. She will be in her sewing room and its far from her phone. Please let the phone ring a little longer.  Patient states she is out of her norvasc. Please advise as well.

## 2012-09-24 NOTE — Telephone Encounter (Signed)
Spoke with patient she called and gave some BP readings, they are as listed below: 5/7 134/81 and 134/66 5/8 123/60

## 2012-10-01 ENCOUNTER — Ambulatory Visit: Payer: Self-pay | Admitting: Pain Medicine

## 2012-10-01 LAB — BASIC METABOLIC PANEL
Anion Gap: 5 — ABNORMAL LOW (ref 7–16)
BUN: 15 mg/dL (ref 7–18)
Calcium, Total: 9.5 mg/dL (ref 8.5–10.1)
Creatinine: 0.65 mg/dL (ref 0.60–1.30)
EGFR (African American): >60
Osmolality: 282 (ref 275–301)
Sodium: 138 mmol/L (ref 136–145)

## 2012-10-01 LAB — SEDIMENTATION RATE: Erythrocyte Sed Rate: 33 mm/hr — ABNORMAL HIGH (ref 0–30)

## 2012-10-01 LAB — MAGNESIUM: Magnesium: 1.4 mg/dL — ABNORMAL LOW

## 2012-10-05 ENCOUNTER — Telehealth: Payer: Self-pay | Admitting: Internal Medicine

## 2012-10-05 MED ORDER — AMLODIPINE BESYLATE 10 MG PO TABS: 10.0000 mg | ORAL_TABLET | Freq: Every day | ORAL | Status: DC

## 2012-10-05 NOTE — Telephone Encounter (Signed)
amLODipine (NORVASC) 10 MG tablet #90 °

## 2012-10-05 NOTE — Telephone Encounter (Signed)
Spoke with patient, she stated she never received the refill sent in January. Rx re-sent to [facility]

## 2012-10-28 ENCOUNTER — Ambulatory Visit: Payer: Medicare Other | Admitting: Internal Medicine

## 2012-11-02 ENCOUNTER — Telehealth: Payer: Self-pay | Admitting: Internal Medicine

## 2012-11-02 NOTE — Telephone Encounter (Signed)
Needs to be seen by ortho or in ED where they can get imaging of her shoulder.

## 2012-11-02 NOTE — Telephone Encounter (Signed)
Patient Information:  Caller Name: Tommi  Phone: [phone]  Patient: [name] [name]  Gender: Female  DOB: May 21, 1934  Age: 78 Years  PCP: Ronna Polio (Adults only)  Office Follow Up:  Does the office need to follow up with this patient?: No  Instructions For The Office: N/A  RN Note:  Pt states that she doesn't want to go to the ED and would prefer office visit; offered an appt for today but pt states that she called her orthopedist office and would prefer to be seen there; pt states that she is going to wait to hear from Orthopedist office first; will call us back if unable to see Orthopedist  Symptoms  Reason For Call & Symptoms: Pt is calling and states that she fell in her yard on to her left shoulder;  incident occured 10/31/12;  left shoulder is painful around shoulder blade, ribs and neck area; denies diff breathing; can not move left arm above waist  Reviewed Health History In EMR: Yes  Reviewed Medications In EMR: Yes  Reviewed Allergies In EMR: Yes  Reviewed Surgeries / Procedures: Yes  Date of Onset of Symptoms: 10/31/2012  Treatments Tried: Tramadol  Treatments Tried Worked: No  Guideline(s) Used:  Shoulder Injury  Disposition Per Guideline:   Go to ED Now (or to Office with PCP Approval)  Reason For Disposition Reached:   Can't move injured shoulder at all  Advice Given:  Apply a Cold Pack:  Continue this for the first 48 hours after an injury.  Apply a Cold Pack:  Apply a cold pack or an ice bag (wrapped in a moist towel) to the area for 20 minutes. Repeat in 1 hour, then every 4 hours while awake.  Continue this for the first 48 hours after an injury.  Apply Heat to the Area:  Beginning 48 hours after an injury, apply a warm washcloth or heating pad for 10 minutes three times a day.  This will help increase blood flow and improve healing.  Rest vs. Movement:  Continue normal activities as much as your pain permits.  Avoid heavy lifting and  active sports for 1-2 weeks or until the pain and swelling are gone.  Call Back If:  You become worse.  Patient Refused Recommendation:  Patient Will Make Own Appointment  Pt wants to be seen by her orthopedist and she is waiting to hear back from his office

## 2012-11-02 NOTE — Telephone Encounter (Signed)
[name] [name] call back still wanting to be seen today by Dr. Dan Humphreys, told [name] [name] that Dr. Dan Humphreys said she needs to be by a ortho or in ED where they can get imaging of her shoulder. Pt response was okay and she hung up.

## 2012-11-02 NOTE — Telephone Encounter (Signed)
Fwd to Dr. Walker 

## 2012-11-13 ENCOUNTER — Other Ambulatory Visit: Payer: Self-pay | Admitting: Internal Medicine

## 2012-11-18 ENCOUNTER — Telehealth: Payer: Self-pay | Admitting: Internal Medicine

## 2012-11-18 NOTE — Telephone Encounter (Signed)
Patient Information:  Caller Name: [name] [name]  Phone: [phone]  Patient: [name] [name]  Gender: Female  DOB: 05-23-1934  Age: 78 Years  PCP: Ronna Polio (Adults only)  Office Follow Up:  Does the office need to follow up with this patient?: No  Instructions For The Office: N/A  RN Note:  Called to requet antibiotic for flank pain. Denies UTI symptoms. Right flank pain rated 9/10 now, pain worse with movement. Reluctant to go to ED due to no one available to drive her.  Offered suggestions including calling cab or 911, if necessary.    Symptoms  Reason For Call & Symptoms: Right flank pain with movement.  FBS 123 at 0600. Fell 2 weeks ago in yard injuring left shoulder; evaluated by orthoped.  Reviewed Health History In EMR: Yes  Reviewed Medications In EMR: Yes  Reviewed Allergies In EMR: Yes  Reviewed Surgeries / Procedures: Yes  Date of Onset of Symptoms: 11/15/2012  Treatments Tried: Azo standard, Tramadol, Flexer cold patch  Treatments Tried Worked: No  Guideline(s) Used:  Flank Pain  Disposition Per Guideline:   Go to ED Now  Reason For Disposition Reached:   Severe pain (e.g., excruciating, scale 8-10) and present > 1 hour  Advice Given:  N/A  Patient Will Follow Care Advice:  YES

## 2012-11-18 NOTE — Telephone Encounter (Signed)
Spoke with patient, she does have any transportation get any medical treatment today, has another appointment tomorrow at [facility] so she could not come until later in the morning. Patient confirmed appointment with Raquel tomorrow.

## 2012-11-18 NOTE — Telephone Encounter (Signed)
The patient did not go to the ER. She could not find a ride.

## 2012-11-18 NOTE — Telephone Encounter (Signed)
She needs to be seen. At this point, either at urgent care or ED tonight, or could be seen in visit tomorrow in office if pain not severe.

## 2012-11-18 NOTE — Telephone Encounter (Signed)
Tried to reach patient at home number, must of followed RN advise and went to ER.

## 2012-11-18 NOTE — Telephone Encounter (Signed)
Spoke with patient, the pain began about 3 days ago on Sunday. At the end of ribs on the back, just a bad pain and any movement makes it hurt. As long as she sit still it will not hurt, can not describe the pain. If she make any move it will hurt. Did not go to the ED, was not able to find a ride.

## 2012-11-19 ENCOUNTER — Encounter: Payer: Self-pay | Admitting: Adult Health

## 2012-11-19 ENCOUNTER — Ambulatory Visit (INDEPENDENT_AMBULATORY_CARE_PROVIDER_SITE_OTHER): Payer: Medicare Other | Admitting: Adult Health

## 2012-11-19 ENCOUNTER — Telehealth: Payer: Self-pay | Admitting: *Deleted

## 2012-11-19 ENCOUNTER — Ambulatory Visit: Payer: Self-pay | Admitting: Adult Health

## 2012-11-19 VITALS — BP 126/60 | HR 73 | Temp 98.4°F | Resp 14 | Wt 219.0 lb

## 2012-11-19 DIAGNOSIS — M549 Dorsalgia, unspecified: Secondary | ICD-10-CM

## 2012-11-19 LAB — BASIC METABOLIC PANEL
Calcium: 9.4 mg/dL (ref 8.4–10.5)
GFR: 79.94 mL/min (ref >60.00)
Glucose, Bld: 161 mg/dL — ABNORMAL HIGH (ref 70–99)
Potassium: 4.4 mEq/L (ref 3.5–5.1)
Sodium: 140 mEq/L (ref 135–145)

## 2012-11-19 LAB — POCT URINALYSIS DIPSTICK
Bilirubin, UA: NEGATIVE
Ketones, UA: NEGATIVE
Protein, UA: 100
Spec Grav, UA: 1.02
pH, UA: 5.5

## 2012-11-19 NOTE — Assessment & Plan Note (Signed)
Patient's pain is over right flank area. Musculoskeletal vs. Nephrolithiasis. UA dipstick shows blood. Send urine for culture. CT scan of abdomen and pelvis without to rule out nephrolithiasis. Patient has tramadol for pain.

## 2012-11-19 NOTE — Telephone Encounter (Signed)
Would you like a culture done?

## 2012-11-19 NOTE — Telephone Encounter (Signed)
Yes

## 2012-11-19 NOTE — Progress Notes (Signed)
Subjective:    Patient ID: [name] [name], female    DOB: Oct 01, 1934, 78 y.o.   MRN: [id number]  HPI  Patient presents with right flank pain which began on Sunday. Worse with movement, improves with rest. She denies any activity that may have caused her pain. No fever. She has taken tramadol without any relief.  She has applied ice and heat to the area but does not notice any improvement with either. No dysuria but is having some hesitancy. She has taken AZO but has not helped.  Current Outpatient Prescriptions on File Prior to Visit  Medication Sig Dispense Refill  . amLODipine (NORVASC) 10 MG tablet Take 1 tablet (10 mg total) by mouth daily.  90 tablet  2  . aspirin 81 MG tablet Take 162 mg by mouth daily.       . cyclobenzaprine (FLEXERIL) 5 MG tablet Take 1 tablet (5 mg total) by mouth 3 (three) times daily as needed.  90 tablet  1  . fluticasone (FLONASE) 50 MCG/ACT nasal spray Place 2 sprays into the nose daily.  16 g  3  . furosemide (LASIX) 40 MG tablet Take 1 tablet (40 mg total) by mouth 2 (two) times daily.  180 tablet  3  . gabapentin (NEURONTIN) 100 MG capsule Take 1 capsule (100 mg total) by mouth 3 (three) times daily.  270 capsule  3  . gentamicin cream (GARAMYCIN) 0.1 % Apply topically 3 (three) times daily.  15 g  0  . insulin glargine (LANTUS) 100 UNIT/ML injection Inject 50 Units into the skin at bedtime.  30 mL  3  . isosorbide mononitrate (ISMO,MONOKET) 10 MG tablet Take 1 tablet (10 mg total) by mouth 2 (two) times daily.  180 tablet  3  . latanoprost (XALATAN) 0.005 % ophthalmic solution       . levothyroxine (LEVOTHROID) 150 MCG tablet Take 1 tablet (150 mcg total) by mouth daily.  90 tablet  3  . lisinopril (PRINIVIL,ZESTRIL) 10 MG tablet Take 1 tablet (10 mg total) by mouth daily.  90 tablet  3  . meclizine (ANTIVERT) 25 MG tablet Take 1 tablet (25 mg total) by mouth daily. As needed for dizziness or nausea  90 tablet  3  . meloxicam (MOBIC) 7.5 MG tablet       .  metFORMIN (GLUCOPHAGE) 500 MG tablet Take 1 tablet (500 mg total) by mouth 2 (two) times daily.  180 tablet  3  . metoprolol tartrate (LOPRESSOR) 25 MG tablet Take 1 tablet (25 mg total) by mouth 2 (two) times daily.  180 tablet  3  . nitroGLYCERIN (NITROSTAT) 0.4 MG SL tablet Place 1 tablet (0.4 mg total) under the tongue every 5 (five) minutes as needed.  25 tablet  3  . NOVOLIN R RELION 100 UNIT/ML injection INJECT  SUBCUTANEOUSLY 4  UNITS  IF  BLOOD  SUGAR  IS  GREATER  THAN  250  10 mL  2  . nystatin cream (MYCOSTATIN) Apply topically 2 (two) times daily.  30 g  0  . nystatin-triamcinolone (MYCOLOG II) cream Apply 1 application topically 2 (two) times daily as needed.  30 g  1  . omeprazole (PRILOSEC) 20 MG capsule Take 1 capsule (20 mg total) by mouth 2 (two) times daily.  180 capsule  3  . potassium chloride SA (K-DUR,KLOR-CON) 20 MEQ tablet Take 1 tablet (20 mEq total) by mouth 2 (two) times daily.  180 tablet  3  . simvastatin (ZOCOR) 40 MG  tablet Take 1 tablet (40 mg total) by mouth at bedtime.  90 tablet  3  . traMADol (ULTRAM) 50 MG tablet Take 1 tablet (50 mg total) by mouth every 8 (eight) hours as needed for pain.  90 tablet  3   No current facility-administered medications on file prior to visit.      Review of Systems  Respiratory: Negative.   Cardiovascular: Negative.   Gastrointestinal: Negative for nausea, vomiting and abdominal pain.  Genitourinary: Negative for dysuria and hematuria.       Objective:   Physical Exam  Constitutional: She is oriented to person, place, and time.  Obese, pleasant female who appears uncomfortable with movement  Cardiovascular: Normal rate, regular rhythm and normal heart sounds.  Exam reveals no gallop and no friction rub.   No murmur heard. Pulmonary/Chest: Effort normal and breath sounds normal. No respiratory distress. She has no wheezes. She has no rales.  Abdominal: Soft. Bowel sounds are normal.  Musculoskeletal: She exhibits  tenderness.  Difficulty moving from sitting to standing position. Ambulating with a cane.  Neurological: She is alert and oriented to person, place, and time.  Skin: Skin is warm and dry.  Psychiatric: She has a normal mood and affect. Her behavior is normal. Judgment and thought content normal.          Assessment & Plan:

## 2012-11-22 LAB — URINE CULTURE

## 2012-11-23 ENCOUNTER — Other Ambulatory Visit: Payer: Self-pay | Admitting: *Deleted

## 2012-11-23 ENCOUNTER — Other Ambulatory Visit: Payer: Self-pay | Admitting: Adult Health

## 2012-11-23 DIAGNOSIS — M25512 Pain in left shoulder: Secondary | ICD-10-CM

## 2012-11-23 DIAGNOSIS — G8929 Other chronic pain: Secondary | ICD-10-CM

## 2012-11-23 MED ORDER — HYDROCODONE-ACETAMINOPHEN 5-325 MG PO TABS: 1.0000 | ORAL_TABLET | Freq: Four times a day (QID) | ORAL | Status: DC | PRN

## 2012-11-23 MED ORDER — INSULIN REGULAR HUMAN 100 UNIT/ML IJ SOLN: INTRAMUSCULAR | Status: DC

## 2012-11-23 MED ORDER — CYCLOBENZAPRINE HCL 5 MG PO TABS: 5.0000 mg | ORAL_TABLET | Freq: Three times a day (TID) | ORAL | Status: DC | PRN

## 2012-11-23 NOTE — Progress Notes (Signed)
  Patient with pain in right flank area. CT abdomen/pelvis without acute abnormality.  Flexeril and Norco ordered for short term.  Return to clinic if symptoms do not improve within 1 week.

## 2012-11-23 NOTE — Telephone Encounter (Signed)
Pt notified of CT results, denies any urinary frequency or dysuria, daily BMs. Notified of Rxs sent to pharmacy, advised to call back next week with failure of improvement. Norco called to [facility].

## 2012-11-23 NOTE — Telephone Encounter (Signed)
Pt called today requesting the result from her CT scan ([facility]). Pt is still in a lot of pain. And states that she wasn't given any pain meds.

## 2012-11-23 NOTE — Telephone Encounter (Signed)
Sent in prescription for flexeril and Norco. This is only for short term. Please tell patient to return to clinic if symptoms not improved within 1 week.

## 2012-11-23 NOTE — Telephone Encounter (Signed)
Pt called requesting refill & ct scan results ordered by Raquel (sent note to [facility]).

## 2012-12-10 ENCOUNTER — Telehealth: Payer: Self-pay | Admitting: Internal Medicine

## 2012-12-10 MED ORDER — INSULIN REGULAR HUMAN 100 UNIT/ML IJ SOLN: INTRAMUSCULAR | Status: DC

## 2012-12-10 NOTE — Telephone Encounter (Signed)
Pt states pharmacy told her that her insulin was not ordered.  Advised pt med lists shows receipt from pharmacy 7/7.  Pt states she is completely out.  Please resend.

## 2012-12-10 NOTE — Telephone Encounter (Signed)
Rx re-sent to pharmacy, stated it was to go to [place], not [place]. Informed patient this is why we ask them to contact their pharmacy and they contact us, if done this way it will cut down all the confusion especially when you use multiple pharmacies.

## 2012-12-15 ENCOUNTER — Other Ambulatory Visit: Payer: Self-pay | Admitting: Adult Health

## 2012-12-15 ENCOUNTER — Other Ambulatory Visit: Payer: Self-pay | Admitting: *Deleted

## 2012-12-15 DIAGNOSIS — G8929 Other chronic pain: Secondary | ICD-10-CM

## 2012-12-15 MED ORDER — CYCLOBENZAPRINE HCL 5 MG PO TABS: 5.0000 mg | ORAL_TABLET | Freq: Three times a day (TID) | ORAL | Status: DC | PRN

## 2012-12-15 NOTE — Telephone Encounter (Signed)
Spoke with pt, states back pain has not improved from 11/19/12 visit. States [facility] and sent for numerous tests. Has an appointment to see that physician again next week. Advised pt that she would need to contact that office for pain medication refills. Refill denied.

## 2012-12-21 ENCOUNTER — Ambulatory Visit: Payer: Self-pay | Admitting: Pain Medicine

## 2012-12-30 ENCOUNTER — Ambulatory Visit: Payer: Medicare Other | Admitting: Internal Medicine

## 2013-01-08 ENCOUNTER — Ambulatory Visit: Payer: Self-pay | Admitting: Internal Medicine

## 2013-01-13 ENCOUNTER — Encounter: Payer: Self-pay | Admitting: *Deleted

## 2013-01-14 ENCOUNTER — Encounter: Payer: Self-pay | Admitting: Internal Medicine

## 2013-01-14 ENCOUNTER — Ambulatory Visit (INDEPENDENT_AMBULATORY_CARE_PROVIDER_SITE_OTHER): Payer: Medicare Other | Admitting: Internal Medicine

## 2013-01-14 VITALS — BP 122/64 | HR 76 | Temp 98.2°F | Wt 225.0 lb

## 2013-01-14 DIAGNOSIS — E1165 Type 2 diabetes mellitus with hyperglycemia: Secondary | ICD-10-CM

## 2013-01-14 DIAGNOSIS — IMO0001 Reserved for inherently not codable concepts without codable children: Secondary | ICD-10-CM

## 2013-01-14 DIAGNOSIS — IMO0002 Reserved for concepts with insufficient information to code with codable children: Secondary | ICD-10-CM

## 2013-01-14 DIAGNOSIS — E039 Hypothyroidism, unspecified: Secondary | ICD-10-CM

## 2013-01-14 DIAGNOSIS — I10 Essential (primary) hypertension: Secondary | ICD-10-CM

## 2013-01-14 DIAGNOSIS — Z1239 Encounter for other screening for malignant neoplasm of breast: Secondary | ICD-10-CM | POA: Insufficient documentation

## 2013-01-14 DIAGNOSIS — E669 Obesity, unspecified: Secondary | ICD-10-CM

## 2013-01-14 DIAGNOSIS — N390 Urinary tract infection, site not specified: Secondary | ICD-10-CM

## 2013-01-14 DIAGNOSIS — E785 Hyperlipidemia, unspecified: Secondary | ICD-10-CM

## 2013-01-14 LAB — POCT URINALYSIS DIPSTICK
Bilirubin, UA: NEGATIVE
Nitrite, UA: POSITIVE
Urobilinogen, UA: 1
pH, UA: 5

## 2013-01-14 LAB — MICROALBUMIN / CREATININE URINE RATIO
Creatinine,U: 173.7 mg/dL
Microalb Creat Ratio: 23 mg/g (ref 0.0–30.0)

## 2013-01-14 LAB — HM DIABETES FOOT EXAM: HM Diabetic Foot Exam: NORMAL

## 2013-01-14 MED ORDER — LISINOPRIL 10 MG PO TABS: 10.0000 mg | ORAL_TABLET | Freq: Every day | ORAL | Status: DC

## 2013-01-14 NOTE — Assessment & Plan Note (Signed)
Will check lipids and LFTs with labs today. Continue simvastatin. 

## 2013-01-14 NOTE — Assessment & Plan Note (Signed)
Will check TSH with labs today. Continue levothyroxine. 

## 2013-01-14 NOTE — Assessment & Plan Note (Signed)
BP Readings from Last 3 Encounters:  01/14/13 122/64  11/19/12 126/60  09/21/12 208/90   Blood pressure well-controlled on current medications. Will check renal function and urine microalbumin with labs today.

## 2013-01-14 NOTE — Progress Notes (Signed)
Subjective:    Patient ID: [name] [name], female    DOB: 08/17/1934, 78 y.o.   MRN: [id number]  HPI 78 year old female with history of obesity, diabetes, hypertension, hyperlipidemia, chronic back pain with spinal stenosis, and recent history of left shoulder fracture presents for followup.  In regards to diabetes, she reports blood sugars have been elevated. She did not bring record of her blood sugars today. Last hemoglobin A1c was 8.5%. She is interested in trying alternative medications to better control her blood sugar. She is trying to follow a lower glycemic index diet.  She reports ongoing issues with chronic pain in her back and now pain in her left shoulder after fracture. She is followed at the pain clinic and has been using hydrocodone with some improvement in symptoms. She is scheduled to have epidural steroid injection next month. She is also scheduled to have surgical repair of her shoulder fracture next month.  She is concerned about her weight. She reports that she continues to gain weight despite trying to limit caloric intake. She has been unable to exercise recently because of ongoing issues with pain in her back and shoulder. She is interested in trying weight loss medications.  Outpatient Encounter Prescriptions as of 01/14/2013  Medication Sig Dispense Refill  . amLODipine (NORVASC) 10 MG tablet Take 1 tablet (10 mg total) by mouth daily.  90 tablet  2  . aspirin 81 MG tablet Take 162 mg by mouth daily.       . cyclobenzaprine (FLEXERIL) 5 MG tablet Take 1 tablet (5 mg total) by mouth 3 (three) times daily as needed.  30 tablet  0  . diazepam (VALIUM) 5 MG tablet       . ferrous sulfate 325 (65 FE) MG tablet Take 325 mg by mouth daily with breakfast.      . fluticasone (FLONASE) 50 MCG/ACT nasal spray Place 2 sprays into the nose daily.  16 g  3  . furosemide (LASIX) 40 MG tablet Take 1 tablet (40 mg total) by mouth 2 (two) times daily.  180 tablet  3  . gabapentin  (NEURONTIN) 100 MG capsule Take 1 capsule (100 mg total) by mouth 3 (three) times daily.  270 capsule  3  . gentamicin cream (GARAMYCIN) 0.1 % Apply topically 3 (three) times daily.  15 g  0  . HYDROcodone-acetaminophen (NORCO/VICODIN) 5-325 MG per tablet Take 1 tablet by mouth every 6 (six) hours as needed for pain.  30 tablet  0  . insulin glargine (LANTUS) 100 UNIT/ML injection Inject 50 Units into the skin at bedtime.  30 mL  3  . insulin regular (NOVOLIN R RELION) 100 units/mL injection INJECT  SUBCUTANEOUSLY 4  UNITS  IF  BLOOD  SUGAR  IS  GREATER  THAN  250  10 mL  2  . isosorbide mononitrate (ISMO,MONOKET) 10 MG tablet Take 1 tablet (10 mg total) by mouth 2 (two) times daily.  180 tablet  3  . latanoprost (XALATAN) 0.005 % ophthalmic solution       . levothyroxine (LEVOTHROID) 150 MCG tablet Take 1 tablet (150 mcg total) by mouth daily.  90 tablet  3  . lisinopril (PRINIVIL,ZESTRIL) 10 MG tablet Take 1 tablet (10 mg total) by mouth daily.  90 tablet  3  . meclizine (ANTIVERT) 25 MG tablet Take 1 tablet (25 mg total) by mouth daily. As needed for dizziness or nausea  90 tablet  3  . meloxicam (MOBIC) 7.5 MG tablet       .  metFORMIN (GLUCOPHAGE) 500 MG tablet Take 1 tablet (500 mg total) by mouth 2 (two) times daily.  180 tablet  3  . metoprolol tartrate (LOPRESSOR) 25 MG tablet Take 1 tablet (25 mg total) by mouth 2 (two) times daily.  180 tablet  3  . nitroGLYCERIN (NITROSTAT) 0.4 MG SL tablet Place 1 tablet (0.4 mg total) under the tongue every 5 (five) minutes as needed.  25 tablet  3  . nystatin cream (MYCOSTATIN) Apply topically 2 (two) times daily.  30 g  0  . nystatin-triamcinolone (MYCOLOG II) cream Apply 1 application topically 2 (two) times daily as needed.  30 g  1  . omeprazole (PRILOSEC) 20 MG capsule Take 1 capsule (20 mg total) by mouth 2 (two) times daily.  180 capsule  3  . potassium chloride SA (K-DUR,KLOR-CON) 20 MEQ tablet Take 1 tablet (20 mEq total) by mouth 2 (two)  times daily.  180 tablet  3  . simvastatin (ZOCOR) 40 MG tablet Take 1 tablet (40 mg total) by mouth at bedtime.  90 tablet  3  . traMADol (ULTRAM) 50 MG tablet Take 1 tablet (50 mg total) by mouth every 8 (eight) hours as needed for pain.  90 tablet  3  . [DISCONTINUED] lisinopril (PRINIVIL,ZESTRIL) 10 MG tablet Take 1 tablet (10 mg total) by mouth daily.  90 tablet  3   No facility-administered encounter medications on file as of 01/14/2013.   BP 122/64  Pulse 76  Temp(Src) 98.2 F (36.8 C) (Oral)  Wt 225 lb (102.059 kg)  BMI 38.6 kg/m2  SpO2 96%  Review of Systems  Constitutional: Negative for fever, chills, appetite change, fatigue and unexpected weight change.  HENT: Negative for ear pain, congestion, sore throat, trouble swallowing, neck pain, voice change and sinus pressure.   Eyes: Negative for visual disturbance.  Respiratory: Negative for cough, shortness of breath, wheezing and stridor.   Cardiovascular: Negative for chest pain, palpitations and leg swelling.  Gastrointestinal: Negative for nausea, vomiting, abdominal pain, diarrhea, constipation, blood in stool, abdominal distention and anal bleeding.  Genitourinary: Negative for dysuria and flank pain.  Musculoskeletal: Positive for myalgias, back pain and arthralgias. Negative for gait problem.  Skin: Negative for color change and rash.  Neurological: Negative for dizziness and headaches.  Hematological: Negative for adenopathy. Does not bruise/bleed easily.  Psychiatric/Behavioral: Negative for suicidal ideas, sleep disturbance and dysphoric mood. The patient is not nervous/anxious.        Objective:   Physical Exam  Constitutional: She is oriented to person, place, and time. She appears well-developed and well-nourished. No distress.  HENT:  Head: Normocephalic and atraumatic.  Right Ear: External ear normal.  Left Ear: External ear normal.  Nose: Nose normal.  Mouth/Throat: Oropharynx is clear and moist. No  oropharyngeal exudate.  Eyes: Conjunctivae are normal. Pupils are equal, round, and reactive to light. Right eye exhibits no discharge. Left eye exhibits no discharge. No scleral icterus.  Neck: Normal range of motion. Neck supple. No tracheal deviation present. No thyromegaly present.  Cardiovascular: Normal rate, regular rhythm, normal heart sounds and intact distal pulses.  Exam reveals no gallop and no friction rub.   No murmur heard. Pulmonary/Chest: Effort normal and breath sounds normal. No accessory muscle usage. Not tachypneic. No respiratory distress. She has no decreased breath sounds. She has no wheezes. She has no rhonchi. She has no rales. She exhibits no tenderness.  Musculoskeletal: Normal range of motion. She exhibits no edema and no tenderness.  Lymphadenopathy:  She has no cervical adenopathy.  Neurological: She is alert and oriented to person, place, and time. No cranial nerve deficit. She exhibits normal muscle tone. Coordination normal.  Skin: Skin is warm and dry. No rash noted. She is not diaphoretic. No erythema. No pallor.  Psychiatric: She has a normal mood and affect. Her behavior is normal. Judgment and thought content normal.          Assessment & Plan:

## 2013-01-14 NOTE — Assessment & Plan Note (Signed)
Wt Readings from Last 3 Encounters:  01/14/13 225 lb (102.059 kg)  11/19/12 219 lb (99.338 kg)  09/21/12 220 lb (99.791 kg)   Encouraged better compliance with healthy, low glycemic index diet and regular physical activity. Patient is interested in taking medications to help suppress appetite however we reviewed potential medications and side effects, and because of risks with her h/o hypertension and heart disease, will hold off for now.

## 2013-01-14 NOTE — Assessment & Plan Note (Signed)
Blood sugars have been historically poorly controlled. Patient did not bring record of blood sugars today. Encouraged her monitor blood sugar at home. Encouraged compliance with healthy, low glycemic index diet. Will check A1c with labs today. Will continue Lantus insulin and metformin. Will set up endocrinology referral. Foot exam abnormal with diminished sensation to monofilament bilaterally.

## 2013-01-14 NOTE — Assessment & Plan Note (Signed)
Patient is overdue for screening mammogram. She has been reluctant to schedule because of cost. Reassured her that mammogram are typically covered by insurance. Will schedule mammogram today. She can discuss costs of procedure with staff at [facility].

## 2013-01-14 NOTE — Addendum Note (Signed)
Addended by: Montine Circle D on: 01/14/2013 02:33 PM   Modules accepted: Orders

## 2013-01-15 LAB — COMPREHENSIVE METABOLIC PANEL
ALT: 10 U/L (ref 0–35)
AST: 18 U/L (ref 0–37)
Alkaline Phosphatase: 76 U/L (ref 39–117)
Creatinine, Ser: 0.7 mg/dL (ref 0.4–1.2)
Sodium: 137 mEq/L (ref 135–145)
Total Bilirubin: 0.6 mg/dL (ref 0.3–1.2)
Total Protein: 7.5 g/dL (ref 6.0–8.3)

## 2013-01-15 LAB — LDL CHOLESTEROL, DIRECT: Direct LDL: 92.7 mg/dL

## 2013-01-15 LAB — LIPID PANEL: HDL: 42 mg/dL (ref >39.00)

## 2013-01-15 LAB — URINE CULTURE
Colony Count: NO GROWTH
Organism ID, Bacteria: NO GROWTH

## 2013-01-19 ENCOUNTER — Telehealth: Payer: Self-pay | Admitting: *Deleted

## 2013-01-19 DIAGNOSIS — N390 Urinary tract infection, site not specified: Secondary | ICD-10-CM

## 2013-01-19 MED ORDER — CIPROFLOXACIN HCL 250 MG PO TABS: 250.0000 mg | ORAL_TABLET | Freq: Two times a day (BID) | ORAL | Status: DC

## 2013-01-19 NOTE — Telephone Encounter (Signed)
Message copied by Theola Sequin on Tue Jan 19, 2013 11:10 AM ------      Message from: Ronna Polio A      Created: Mon Jan 18, 2013  4:38 PM       Blood sugar continues to be very high. Thyroid function is also slightly decreased. I would like to set up referral to Dr. Elvera Lennox in endocrinology. ------

## 2013-01-19 NOTE — Telephone Encounter (Signed)
Informed patient of her lab results, she would like to know if you would like to do anything about her thyroid? Antibiotics for UTI sent to pharmacy of patient choice. She is also fine with being referred, but thought you had already done it.

## 2013-01-19 NOTE — Telephone Encounter (Signed)
Can we schedule her a follow up this week to discuss the thyroid function?

## 2013-01-19 NOTE — Telephone Encounter (Signed)
Message copied by Theola Sequin on Tue Jan 19, 2013 11:10 AM ------      Message from: Ronna Polio A      Created: Thu Jan 14, 2013  2:10 PM       Urinalysis is consistent with UTI. Please send urine for culture and start Cipro 250mg  po bid x 7 days. We will need to let pt know results and to pick up Cipro ------

## 2013-01-20 NOTE — Telephone Encounter (Signed)
Patient scheduled for appointment on Monday.

## 2013-01-21 ENCOUNTER — Telehealth: Payer: Self-pay | Admitting: Internal Medicine

## 2013-01-21 NOTE — Telephone Encounter (Signed)
We have a slot open on 9/16 at 7:15 that we can use. There is not enough time at the visit 9/8

## 2013-01-21 NOTE — Telephone Encounter (Signed)
Fwd to Dr. Walker 

## 2013-01-21 NOTE — Telephone Encounter (Signed)
Dr. Claiborne Rigg office calling to see if pt appt 9/8 can be extended to include medical clearance.  Pt is having L shoulder surgery 9/18.  Pt is in acute visit slot 9/8, no 30 min slot available.

## 2013-01-25 ENCOUNTER — Ambulatory Visit: Payer: Medicare Other | Admitting: Internal Medicine

## 2013-01-26 ENCOUNTER — Ambulatory Visit: Payer: Self-pay | Admitting: Pain Medicine

## 2013-01-26 ENCOUNTER — Ambulatory Visit: Payer: Medicare Other | Admitting: Adult Health

## 2013-01-26 ENCOUNTER — Encounter: Payer: Self-pay | Admitting: *Deleted

## 2013-01-26 NOTE — Telephone Encounter (Signed)
Patient could not do that time, it was too early for her. Scheduled an appointment to see [name] [name].

## 2013-01-27 ENCOUNTER — Ambulatory Visit (INDEPENDENT_AMBULATORY_CARE_PROVIDER_SITE_OTHER): Payer: Medicare Other | Admitting: Adult Health

## 2013-01-27 ENCOUNTER — Encounter: Payer: Self-pay | Admitting: Adult Health

## 2013-01-27 VITALS — BP 126/62 | HR 61 | Temp 98.3°F | Resp 12 | Wt 214.5 lb

## 2013-01-27 DIAGNOSIS — Z01818 Encounter for other preprocedural examination: Secondary | ICD-10-CM | POA: Insufficient documentation

## 2013-01-27 NOTE — Assessment & Plan Note (Signed)
Blood work 01/14/13 shows uncontrolled blood glucose. Pt has been referred to endocrine for hx of uncontrolled diabetes. Steroid injection into her back driving blood glucose up further. She does not have any hx of poor healing; however, that is a concern with upcoming surgery. She would be at some risk for poor wound healing. EKG showed inverted T waves in lateral leads and slight ST depression in V5, V6. We have scheduled her to be seen by her cardiologist tomorrow for cardiac clearance.

## 2013-01-27 NOTE — Patient Instructions (Addendum)
  We have scheduled an appointment for you to see Dr. Mariah Milling tomorrow at 3:15 pm  For cardiac clearance prior to your surgery.

## 2013-01-27 NOTE — Progress Notes (Signed)
Subjective:    Patient ID: [name] [name], female    DOB: 1934/08/20, 78 y.o.   MRN: [id number]  HPI  Patient is a pleasant 78 year old female who presents to clinic for medical evaluation prior to having left shoulder surgery on 02/04/2013. Patient reports that she is having preop testing at the hospital tomorrow. She is requesting that we not repeat blood work today since she has been getting bills for multiple lab work and she voices concerns about being able to afford them. We will check an EKG. Recent lab work done 01/14/2013 revealed blood sugars not well controlled. She had a hemoglobin A1c of 8.3. She reports being on Lantus 50 units at bedtime and regular insulin 4 units if blood glucose greater than 250. She is also on metformin 500 mg twice a day. Patient reports getting steroid injection yesterday for her back pain. This drove her blood glucose levels up to 500 yesterday per her report. She reports taking regular insulin at that time to drop levels. There are also some dietary indiscretions. Patient has been referred to endocrinology for history of uncontrolled diabetes.   Current Outpatient Prescriptions on File Prior to Visit  Medication Sig Dispense Refill  . amLODipine (NORVASC) 10 MG tablet Take 1 tablet (10 mg total) by mouth daily.  90 tablet  2  . aspirin 81 MG tablet Take by mouth daily.       . ciprofloxacin (CIPRO) 250 MG tablet Take 1 tablet (250 mg total) by mouth 2 (two) times daily.  14 tablet  0  . cyclobenzaprine (FLEXERIL) 5 MG tablet Take 1 tablet (5 mg total) by mouth 3 (three) times daily as needed.  30 tablet  0  . ferrous sulfate 325 (65 FE) MG tablet Take 325 mg by mouth daily with breakfast.      . furosemide (LASIX) 40 MG tablet Take 1 tablet (40 mg total) by mouth 2 (two) times daily.  180 tablet  3  . gabapentin (NEURONTIN) 100 MG capsule Take 1 capsule (100 mg total) by mouth 3 (three) times daily.  270 capsule  3  . gentamicin cream (GARAMYCIN) 0.1 %  Apply topically 3 (three) times daily.  15 g  0  . HYDROcodone-acetaminophen (NORCO/VICODIN) 5-325 MG per tablet Take 1 tablet by mouth every 6 (six) hours as needed for pain.  30 tablet  0  . insulin glargine (LANTUS) 100 UNIT/ML injection Inject 50 Units into the skin at bedtime.  30 mL  3  . insulin regular (NOVOLIN R RELION) 100 units/mL injection INJECT  SUBCUTANEOUSLY 4  UNITS  IF  BLOOD  SUGAR  IS  GREATER  THAN  250  10 mL  2  . isosorbide mononitrate (ISMO,MONOKET) 10 MG tablet Take 1 tablet (10 mg total) by mouth 2 (two) times daily.  180 tablet  3  . latanoprost (XALATAN) 0.005 % ophthalmic solution Place 1 drop into both eyes at bedtime.       Marland Kitchen levothyroxine (LEVOTHROID) 150 MCG tablet Take 1 tablet (150 mcg total) by mouth daily.  90 tablet  3  . lisinopril (PRINIVIL,ZESTRIL) 10 MG tablet Take 1 tablet (10 mg total) by mouth daily.  90 tablet  3  . meclizine (ANTIVERT) 25 MG tablet Take 1 tablet (25 mg total) by mouth daily. As needed for dizziness or nausea  90 tablet  3  . metFORMIN (GLUCOPHAGE) 500 MG tablet Take 1 tablet (500 mg total) by mouth 2 (two) times daily.  180 tablet  3  . omeprazole (PRILOSEC) 20 MG capsule Take 1 capsule (20 mg total) by mouth 2 (two) times daily.  180 capsule  3  . potassium chloride SA (K-DUR,KLOR-CON) 20 MEQ tablet Take 1 tablet (20 mEq total) by mouth 2 (two) times daily.  180 tablet  3  . simvastatin (ZOCOR) 40 MG tablet Take 1 tablet (40 mg total) by mouth at bedtime.  90 tablet  3  . traMADol (ULTRAM) 50 MG tablet Take 1 tablet (50 mg total) by mouth every 8 (eight) hours as needed for pain.  90 tablet  3  . fluticasone (FLONASE) 50 MCG/ACT nasal spray Place 2 sprays into the nose daily.  16 g  3  . nitroGLYCERIN (NITROSTAT) 0.4 MG SL tablet Place 1 tablet (0.4 mg total) under the tongue every 5 (five) minutes as needed.  25 tablet  3  . nystatin cream (MYCOSTATIN) Apply topically 2 (two) times daily.  30 g  0  . nystatin-triamcinolone (MYCOLOG  II) cream Apply 1 application topically 2 (two) times daily as needed.  30 g  1   No current facility-administered medications on file prior to visit.    Review of Systems  Constitutional: Negative.   Respiratory: Negative.   Cardiovascular: Negative.   Gastrointestinal: Negative.   Genitourinary: Positive for flank pain.       Currently on antibiotic for recent UTI  Musculoskeletal:       Pain left shoulder.  Skin: Negative.   Neurological: Negative.   Hematological: Negative.   Psychiatric/Behavioral: Negative.    BP 126/62  Pulse 61  Temp(Src) 98.3 F (36.8 C) (Oral)  Resp 12  Wt 214 lb 8 oz (97.297 kg)  BMI 36.8 kg/m2  SpO2 97%    Objective:   Physical Exam  Constitutional: She is oriented to person, place, and time.  Cardiovascular: Normal rate, regular rhythm, normal heart sounds and intact distal pulses.  Exam reveals no gallop and no friction rub.   No murmur heard. Pulmonary/Chest: Effort normal and breath sounds normal. No respiratory distress. She has no wheezes. She has no rales.  Abdominal: Soft. Bowel sounds are normal.  Musculoskeletal:  Fracture of left shoulder - inability to raise left arm. Sgy for repair scheduled 02/04/13. Ambulating with cane.   Neurological: She is alert and oriented to person, place, and time.  Psychiatric: She has a normal mood and affect. Her behavior is normal. Judgment and thought content normal.        Assessment & Plan:

## 2013-01-28 ENCOUNTER — Encounter: Payer: Self-pay | Admitting: Cardiovascular Disease

## 2013-01-28 ENCOUNTER — Ambulatory Visit (INDEPENDENT_AMBULATORY_CARE_PROVIDER_SITE_OTHER): Payer: Medicare Other | Admitting: Cardiovascular Disease

## 2013-01-28 VITALS — BP 158/80 | HR 66 | Ht 63.0 in | Wt 225.5 lb

## 2013-01-28 DIAGNOSIS — I10 Essential (primary) hypertension: Secondary | ICD-10-CM

## 2013-01-28 DIAGNOSIS — IMO0001 Reserved for inherently not codable concepts without codable children: Secondary | ICD-10-CM

## 2013-01-28 DIAGNOSIS — R0602 Shortness of breath: Secondary | ICD-10-CM

## 2013-01-28 DIAGNOSIS — E1165 Type 2 diabetes mellitus with hyperglycemia: Secondary | ICD-10-CM

## 2013-01-28 DIAGNOSIS — I2510 Atherosclerotic heart disease of native coronary artery without angina pectoris: Secondary | ICD-10-CM

## 2013-01-28 DIAGNOSIS — IMO0002 Reserved for concepts with insufficient information to code with codable children: Secondary | ICD-10-CM

## 2013-01-28 DIAGNOSIS — E785 Hyperlipidemia, unspecified: Secondary | ICD-10-CM

## 2013-01-28 DIAGNOSIS — Z01810 Encounter for preprocedural cardiovascular examination: Secondary | ICD-10-CM

## 2013-01-28 NOTE — Assessment & Plan Note (Signed)
Talked to her about a more strict diet. She attributes the poorly controlled diabetes 2 cortisone shots

## 2013-01-28 NOTE — Assessment & Plan Note (Signed)
Acceptable risk for upcoming left shoulder surgery. No further testing needed

## 2013-01-28 NOTE — Patient Instructions (Addendum)
You are doing well. No medication changes were made.  Please call us if you have new issues that need to be addressed before your next appt.  Your physician wants you to follow-up in: 12 months.  You will receive a reminder letter in the mail two months in advance. If you don't receive a letter, please call our office to schedule the follow-up appointment. 

## 2013-01-28 NOTE — Progress Notes (Signed)
Patient ID: [name] [name], female    DOB: 1935/02/04, 78 y.o.   MRN: [id number]  HPI Comments: [name] [name] is a 78 year old woman with a history of CAD, status post PTCA/cutting balloon angioplasty for in-stent restenosis of the RCA,  Who presents for routine followup.   episode of chest pain in 2012, admitted to the hospital, workup including cardiac enzymes and a stress test. Stress test showed no ischemia and she was discharged home She has significant orthopedic problems. She had a fall in the recent past, broke her left shoulder. Scheduled for surgery on September 18.  She reports having history of total right knee replacement, may need surgery on the left knee, also spinal stenosis. She has mild chronic shortness of breath. This is unchanged from previous visits. Has received prednisone/cortisone shots which has pushed her sugars up. Hemoglobin A1c 8.3. Total cholesterol has increased from 154 up to 195.  She denies any chest pain, anginal-type symptoms, no lightheadedness or dizziness. She walks with a cane  EKG shows normal sinus rhythm ,  rate of 66 and beats per minute , ST and T wave abnormality in lead one and aVL       Outpatient Encounter Prescriptions as of 01/28/2013  Medication Sig Dispense Refill  . amLODipine (NORVASC) 10 MG tablet Take 1 tablet (10 mg total) by mouth daily.  90 tablet  2  . aspirin 81 MG tablet Take by mouth daily.       . ciprofloxacin (CIPRO) 250 MG tablet Take 1 tablet (250 mg total) by mouth 2 (two) times daily.  14 tablet  0  . cyclobenzaprine (FLEXERIL) 5 MG tablet Take 1 tablet (5 mg total) by mouth 3 (three) times daily as needed.  30 tablet  0  . ferrous sulfate 325 (65 FE) MG tablet Take 325 mg by mouth daily with breakfast.      . fluticasone (FLONASE) 50 MCG/ACT nasal spray Place 2 sprays into the nose daily.  16 g  3  . furosemide (LASIX) 40 MG tablet Take 1 tablet (40 mg total) by mouth 2 (two) times daily.  180 tablet  3  . gabapentin  (NEURONTIN) 100 MG capsule Take 1 capsule (100 mg total) by mouth 3 (three) times daily.  270 capsule  3  . gentamicin cream (GARAMYCIN) 0.1 % Apply topically 3 (three) times daily.  15 g  0  . HYDROcodone-acetaminophen (NORCO/VICODIN) 5-325 MG per tablet Take 1 tablet by mouth every 6 (six) hours as needed for pain.  30 tablet  0  . insulin glargine (LANTUS) 100 UNIT/ML injection Inject 50 Units into the skin at bedtime.  30 mL  3  . insulin regular (NOVOLIN R RELION) 100 units/mL injection INJECT  SUBCUTANEOUSLY 4  UNITS  IF  BLOOD  SUGAR  IS  GREATER  THAN  250  10 mL  2  . isosorbide mononitrate (ISMO,MONOKET) 10 MG tablet Take 1 tablet (10 mg total) by mouth 2 (two) times daily.  180 tablet  3  . latanoprost (XALATAN) 0.005 % ophthalmic solution Place 1 drop into both eyes at bedtime.       Marland Kitchen levothyroxine (LEVOTHROID) 150 MCG tablet Take 1 tablet (150 mcg total) by mouth daily.  90 tablet  3  . lisinopril (PRINIVIL,ZESTRIL) 10 MG tablet Take 1 tablet (10 mg total) by mouth daily.  90 tablet  3  . meclizine (ANTIVERT) 25 MG tablet Take 1 tablet (25 mg total) by mouth daily. As needed for  dizziness or nausea  90 tablet  3  . metFORMIN (GLUCOPHAGE) 500 MG tablet Take 1 tablet (500 mg total) by mouth 2 (two) times daily.  180 tablet  3  . metoprolol tartrate (LOPRESSOR) 25 MG tablet Take 25 mg by mouth 2 (two) times daily as needed.      . nitroGLYCERIN (NITROSTAT) 0.4 MG SL tablet Place 1 tablet (0.4 mg total) under the tongue every 5 (five) minutes as needed.  25 tablet  3  . nystatin cream (MYCOSTATIN) Apply topically 2 (two) times daily.  30 g  0  . nystatin-triamcinolone (MYCOLOG II) cream Apply 1 application topically 2 (two) times daily as needed.  30 g  1  . omeprazole (PRILOSEC) 20 MG capsule Take 1 capsule (20 mg total) by mouth 2 (two) times daily.  180 capsule  3  . potassium chloride SA (K-DUR,KLOR-CON) 20 MEQ tablet Take 1 tablet (20 mEq total) by mouth 2 (two) times daily.  180  tablet  3  . simvastatin (ZOCOR) 40 MG tablet Take 1 tablet (40 mg total) by mouth at bedtime.  90 tablet  3  . traMADol (ULTRAM) 50 MG tablet Take 1 tablet (50 mg total) by mouth every 8 (eight) hours as needed for pain.  90 tablet  3    Review of Systems  Constitutional: Negative.   HENT: Negative.   Eyes: Negative.   Respiratory: Positive for shortness of breath.   Cardiovascular: Negative.   Gastrointestinal: Negative.   Endocrine: Negative.   Musculoskeletal: Positive for back pain, joint swelling, arthralgias and gait problem.  Skin: Negative.   Allergic/Immunologic: Negative.   Neurological: Negative.   Hematological: Negative.   Psychiatric/Behavioral: Negative.   All other systems reviewed and are negative.    BP 158/80  Pulse 66  Ht 5\' 3"  (1.6 m)  Wt 225 lb 8 oz (102.286 kg)  BMI 39.96 kg/m2  Physical Exam  Nursing note and vitals reviewed. Constitutional: She is oriented to person, place, and time. She appears well-developed and well-nourished.  Obese  HENT:  Head: Normocephalic.  Nose: Nose normal.  Mouth/Throat: Oropharynx is clear and moist.  Eyes: Conjunctivae are normal. Pupils are equal, round, and reactive to light.  Neck: Normal range of motion. Neck supple. No JVD present.  Cardiovascular: Normal rate, regular rhythm, S1 normal, S2 normal, normal heart sounds and intact distal pulses.  Exam reveals no gallop and no friction rub.   No murmur heard. Pulmonary/Chest: Effort normal and breath sounds normal. No respiratory distress. She has no wheezes. She has no rales. She exhibits no tenderness.  Abdominal: Soft. Bowel sounds are normal. She exhibits no distension. There is no tenderness.  Musculoskeletal: Normal range of motion. She exhibits no edema and no tenderness.  Lymphadenopathy:    She has no cervical adenopathy.  Neurological: She is alert and oriented to person, place, and time. Coordination normal.  Skin: Skin is warm and dry. No rash noted.  No erythema.  Psychiatric: She has a normal mood and affect. Her behavior is normal. Judgment and thought content normal.    Assessment and Plan

## 2013-01-28 NOTE — Assessment & Plan Note (Signed)
Currently with no symptoms of angina. No further workup at this time. Continue current medication regimen. 

## 2013-01-28 NOTE — Assessment & Plan Note (Signed)
Blood pressure is well controlled on today's visit. No changes made to the medications. 

## 2013-01-28 NOTE — Assessment & Plan Note (Signed)
Cholesterol is above goal. Diabetes is poorly controlled. Encouraged her to watch her diet, try to lose weight. We'll continue on current dose of statin

## 2013-01-29 ENCOUNTER — Telehealth: Payer: Self-pay

## 2013-01-29 NOTE — Telephone Encounter (Signed)
Will fax yesterday's office note to Joni at [facility] at [phone], as it states that pt is "acceptable risk for surgery".

## 2013-01-29 NOTE — Telephone Encounter (Signed)
Needs clearance for shoulder surgery for 02/04/2013. Please call. Pt was seen yesterday.

## 2013-02-01 NOTE — Telephone Encounter (Signed)
Joni with [facility] calling back, states she did not received the surgery clearance. Please call.

## 2013-02-01 NOTE — Telephone Encounter (Signed)
Spoke with Joni--she is aware I am faxing note for clearance.

## 2013-02-02 ENCOUNTER — Telehealth: Payer: Self-pay

## 2013-02-02 ENCOUNTER — Ambulatory Visit: Payer: Self-pay | Admitting: Orthopedic Surgery

## 2013-02-02 LAB — CBC
HCT: 39.3 % (ref 35.0–47.0)
MCH: 29.1 pg (ref 26.0–34.0)
MCHC: 33.5 g/dL (ref 32.0–36.0)

## 2013-02-02 LAB — PROTIME-INR: INR: 0.9

## 2013-02-02 LAB — BASIC METABOLIC PANEL
Anion Gap: 4 — ABNORMAL LOW (ref 7–16)
Calcium, Total: 9.3 mg/dL (ref 8.5–10.1)
EGFR (African American): >60
Osmolality: 283 (ref 275–301)
Potassium: 4.3 mmol/L (ref 3.5–5.1)
Sodium: 138 mmol/L (ref 136–145)

## 2013-02-02 NOTE — Telephone Encounter (Signed)
States she needs completion of last note. Only received 1st and 2nd page. Fax [phone]

## 2013-02-02 NOTE — Telephone Encounter (Signed)
Faxed last office note to Joni at [phone].

## 2013-02-04 ENCOUNTER — Ambulatory Visit: Payer: Self-pay | Admitting: Specialist

## 2013-02-04 HISTORY — PX: SHOULDER SURGERY: SHX246

## 2013-02-04 LAB — CBC WITH DIFFERENTIAL/PLATELET
Basophil #: 0 10*3/uL (ref 0.0–0.1)
Basophil %: 0.2 %
HCT: 34.8 % — ABNORMAL LOW (ref 35.0–47.0)
HGB: 11.7 g/dL — ABNORMAL LOW (ref 12.0–16.0)
Lymphocyte #: 1.4 10*3/uL (ref 1.0–3.6)
MCH: 29.5 pg (ref 26.0–34.0)
MCHC: 33.5 g/dL (ref 32.0–36.0)
MCV: 88 fL (ref 80–100)
Monocyte #: 1.1 x10 3/mm — ABNORMAL HIGH (ref 0.2–0.9)
Platelet: 293 10*3/uL (ref 150–440)
RBC: 3.96 10*6/uL (ref 3.80–5.20)
RDW: 14.2 % (ref 11.5–14.5)
WBC: 15.7 10*3/uL — ABNORMAL HIGH (ref 3.6–11.0)

## 2013-02-04 LAB — URINALYSIS, COMPLETE
Bilirubin,UR: NEGATIVE
Glucose,UR: >=500 mg/dL (ref 0–75)
Hyaline Cast: 6
Ketone: NEGATIVE
Leukocyte Esterase: NEGATIVE
Protein: 100
Squamous Epithelial: 1
WBC UR: 2 /HPF (ref 0–5)

## 2013-02-04 LAB — BASIC METABOLIC PANEL
Anion Gap: 8 (ref 7–16)
BUN: 21 mg/dL — ABNORMAL HIGH (ref 7–18)
Calcium, Total: 8.4 mg/dL — ABNORMAL LOW (ref 8.5–10.1)
Co2: 24 mmol/L (ref 21–32)
EGFR (African American): >60
EGFR (Non-African Amer.): 54 — ABNORMAL LOW
Glucose: 284 mg/dL — ABNORMAL HIGH (ref 65–99)
Sodium: 141 mmol/L (ref 136–145)

## 2013-02-16 ENCOUNTER — Ambulatory Visit: Payer: Medicare Other | Admitting: Cardiovascular Disease

## 2013-03-08 ENCOUNTER — Encounter: Payer: Self-pay | Admitting: *Deleted

## 2013-03-09 ENCOUNTER — Encounter: Payer: Self-pay | Admitting: Adult Health

## 2013-03-09 ENCOUNTER — Ambulatory Visit (INDEPENDENT_AMBULATORY_CARE_PROVIDER_SITE_OTHER): Payer: Medicare Other | Admitting: Adult Health

## 2013-03-09 VITALS — BP 150/62 | HR 68 | Temp 98.4°F | Resp 12 | Wt 227.5 lb

## 2013-03-09 DIAGNOSIS — M25512 Pain in left shoulder: Secondary | ICD-10-CM

## 2013-03-09 DIAGNOSIS — M25519 Pain in unspecified shoulder: Secondary | ICD-10-CM

## 2013-03-09 DIAGNOSIS — I10 Essential (primary) hypertension: Secondary | ICD-10-CM

## 2013-03-09 MED ORDER — LISINOPRIL 10 MG PO TABS: 10.0000 mg | ORAL_TABLET | Freq: Every day | ORAL | Status: DC

## 2013-03-09 NOTE — Assessment & Plan Note (Addendum)
Status post surgery for rotator cuff repair and feels greatly improved. She has not required much pain medication. She is scheduled to see her surgeon tomorrow. Maintain arm in sling until instructed otherwise by surgeon. Continue to work with physical therapy.

## 2013-03-09 NOTE — Progress Notes (Signed)
Subjective:    Patient ID: [name] [name], female    DOB: 1934-07-28, 78 y.o.   MRN: [id number]  HPI  Patient is a pleasant 78 y/o female who presents to clinic s/p left rotator cuff repair. Surgery was 02/04/13. She has been receiving physical therapy at home. She is wearing a sling. Patient reports that her level of pain in the left shoulder has significantly improved. Patient reports she has not needed much pain medication. Returns for follow up with her surgeon tomorrow. Requesting refill on her lisinopril.    Current Outpatient Prescriptions on File Prior to Visit  Medication Sig Dispense Refill  . amLODipine (NORVASC) 10 MG tablet Take 1 tablet (10 mg total) by mouth daily.  90 tablet  2  . aspirin 81 MG tablet Take by mouth daily.       . cyclobenzaprine (FLEXERIL) 5 MG tablet Take 1 tablet (5 mg total) by mouth 3 (three) times daily as needed.  30 tablet  0  . ferrous sulfate 325 (65 FE) MG tablet Take 325 mg by mouth daily with breakfast.      . fluticasone (FLONASE) 50 MCG/ACT nasal spray Place 2 sprays into the nose daily.  16 g  3  . furosemide (LASIX) 40 MG tablet Take 1 tablet (40 mg total) by mouth 2 (two) times daily.  180 tablet  3  . gabapentin (NEURONTIN) 100 MG capsule Take 1 capsule (100 mg total) by mouth 3 (three) times daily.  270 capsule  3  . gentamicin cream (GARAMYCIN) 0.1 % Apply topically 3 (three) times daily.  15 g  0  . HYDROcodone-acetaminophen (NORCO/VICODIN) 5-325 MG per tablet Take 1 tablet by mouth every 6 (six) hours as needed for pain.  30 tablet  0  . insulin glargine (LANTUS) 100 UNIT/ML injection Inject 50 Units into the skin at bedtime.  30 mL  3  . insulin regular (NOVOLIN R RELION) 100 units/mL injection INJECT  SUBCUTANEOUSLY 4  UNITS  IF  BLOOD  SUGAR  IS  GREATER  THAN  250  10 mL  2  . isosorbide mononitrate (ISMO,MONOKET) 10 MG tablet Take 1 tablet (10 mg total) by mouth 2 (two) times daily.  180 tablet  3  . latanoprost (XALATAN) 0.005 %  ophthalmic solution Place 1 drop into both eyes at bedtime.       Marland Kitchen levothyroxine (LEVOTHROID) 150 MCG tablet Take 1 tablet (150 mcg total) by mouth daily.  90 tablet  3  . meclizine (ANTIVERT) 25 MG tablet Take 1 tablet (25 mg total) by mouth daily. As needed for dizziness or nausea  90 tablet  3  . metFORMIN (GLUCOPHAGE) 500 MG tablet Take 1 tablet (500 mg total) by mouth 2 (two) times daily.  180 tablet  3  . metoprolol tartrate (LOPRESSOR) 25 MG tablet Take 25 mg by mouth 2 (two) times daily as needed.      . nitroGLYCERIN (NITROSTAT) 0.4 MG SL tablet Place 1 tablet (0.4 mg total) under the tongue every 5 (five) minutes as needed.  25 tablet  3  . omeprazole (PRILOSEC) 20 MG capsule Take 1 capsule (20 mg total) by mouth 2 (two) times daily.  180 capsule  3  . potassium chloride SA (K-DUR,KLOR-CON) 20 MEQ tablet Take 1 tablet (20 mEq total) by mouth 2 (two) times daily.  180 tablet  3  . simvastatin (ZOCOR) 40 MG tablet Take 1 tablet (40 mg total) by mouth at bedtime.  90 tablet  3  . traMADol (ULTRAM) 50 MG tablet Take 1 tablet (50 mg total) by mouth every 8 (eight) hours as needed for pain.  90 tablet  3  . nystatin cream (MYCOSTATIN) Apply topically 2 (two) times daily.  30 g  0  . nystatin-triamcinolone (MYCOLOG II) cream Apply 1 application topically 2 (two) times daily as needed.  30 g  1   No current facility-administered medications on file prior to visit.    Review of Systems  Constitutional: Negative.   Respiratory: Negative.   Cardiovascular: Negative.   Gastrointestinal: Negative.   Musculoskeletal:       Left arm in sling. S/p rotator cuff repair       Objective:   Physical Exam  Constitutional: She is oriented to person, place, and time. She appears well-developed and well-nourished. No distress.  Cardiovascular: Normal rate, regular rhythm, normal heart sounds and intact distal pulses.  Exam reveals no gallop.   No murmur heard. Pulmonary/Chest: Effort normal and  breath sounds normal. She has no wheezes. She has no rales.  Musculoskeletal:  Left arm with limited ROM. Arm in sling following left rotator cuff repair. She is able to abduct arm to 90. Working with physical therapy.  Neurological: She is alert and oriented to person, place, and time.  Skin: Skin is warm and dry.  Psychiatric: She has a normal mood and affect. Her behavior is normal. Judgment and thought content normal.          Assessment & Plan:

## 2013-03-31 ENCOUNTER — Encounter: Payer: Self-pay | Admitting: Internal Medicine

## 2013-03-31 ENCOUNTER — Ambulatory Visit (INDEPENDENT_AMBULATORY_CARE_PROVIDER_SITE_OTHER): Payer: Medicare Other | Admitting: Internal Medicine

## 2013-03-31 VITALS — BP 130/64 | HR 84 | Temp 98.0°F | Resp 12 | Ht 63.5 in | Wt 215.0 lb

## 2013-03-31 DIAGNOSIS — E1159 Type 2 diabetes mellitus with other circulatory complications: Secondary | ICD-10-CM

## 2013-03-31 DIAGNOSIS — E1165 Type 2 diabetes mellitus with hyperglycemia: Secondary | ICD-10-CM

## 2013-03-31 MED ORDER — LINAGLIPTIN 5 MG PO TABS: 5.0000 mg | ORAL_TABLET | Freq: Every day | ORAL | Status: DC

## 2013-03-31 NOTE — Patient Instructions (Addendum)
Please return in 1 month with your sugar log.  Start 5 mg Tradjenta in am, before breakfast. You also have combination Tradjenta-Metformin - take 2 tablets with breakfast and skip the morning metformin when you do this.  Continue Lantus, but decrease to 36 units at night.   PATIENT INSTRUCTIONS FOR TYPE 2 DIABETES:  **Please join MyChart!** - see attached instructions about how to join   DIET AND EXERCISE Diet and exercise is an important part of diabetic treatment.  We recommended aerobic exercise in the form of brisk walking (working between 40-60% of maximal aerobic capacity, similar to brisk walking) for 150 minutes per week (such as 30 minutes five days per week) along with 3 times per week performing 'resistance' training (using various gauge rubber tubes with handles) 5-10 exercises involving the major muscle groups (upper body, lower body and core) performing 10-15 repetitions (or near fatigue) each exercise. Start at half the above goal but build slowly to reach the above goals. If limited by weight, joint pain, or disability, we recommend daily walking in a swimming pool with water up to waist to reduce pressure from joints while allow for adequate exercise.    BLOOD GLUCOSES Monitoring your blood glucoses is important for continued management of your diabetes. Please check your blood glucoses 2-4 times a day: fasting, before meals and at bedtime (you can rotate these measurements - e.g. one day check before the 3 meals, the next day check before 2 of the meals and before bedtime, etc.   HYPOGLYCEMIA (low blood sugar) Hypoglycemia is usually a reaction to not eating, exercising, or taking too much insulin/ other diabetes drugs.  Symptoms include tremors, sweating, hunger, confusion, headache, etc. Treat IMMEDIATELY with 15 grams of Carbs:   4 glucose tablets    cup regular juice/soda   2 tablespoons raisins   4 teaspoons sugar   1 tablespoon honey Recheck blood glucose in 15 mins  and repeat above if still symptomatic/blood glucose <100. Please contact our office at [phone] if you have questions about how to next handle your insulin.  RECOMMENDATIONS TO REDUCE YOUR RISK OF DIABETIC COMPLICATIONS: * Take your prescribed MEDICATION(S). * Follow a DIABETIC diet: Complex carbs, fiber rich foods, heart healthy fish twice weekly, (monounsaturated and polyunsaturated) fats * AVOID saturated/trans fats, high fat foods, >2,300 mg salt per day. * EXERCISE at least 5 times a week for 30 minutes or preferably daily.  * DO NOT SMOKE OR DRINK more than 1 drink a day. * Check your FEET every day. Do not wear tightfitting shoes. Contact us if you develop an ulcer * See your EYE doctor once a year or more if needed * Get a FLU shot once a year * Get a PNEUMONIA vaccine once before and once after age 75 years  GOALS:  * Your Hemoglobin A1c of <7%  * fasting sugars need to be <130 * after meals sugars need to be <180 (2h after you start eating) * Your Systolic BP should be 140 or lower  * Your Diastolic BP should be 80 or lower  * Your HDL (Good Cholesterol) should be 40 or higher  * Your LDL (Bad Cholesterol) should be 100 or lower  * Your Triglycerides should be 150 or lower  * Your Urine microalbumin (kidney function) should be <30 * Your Body Mass Index should be 25 or lower   We will be glad to help you achieve these goals. Our telephone number is: [phone].

## 2013-03-31 NOTE — Progress Notes (Signed)
Patient ID: [name] [name], female   DOB: [birth year], 78 y.o.   MRN: [id number]  HPI: [name] [name] is a 78 y.o.-year-old female, referred by her PCP, Dr.Walker, for management of DM2, insulin-dependent, uncontrolled, with complications (CAD, s/p stent placement; PN).  Patient has been diagnosed with diabetes in 1983; she started insulin in 1989. Last hemoglobin A1c was: Lab Results  Component Value Date   HGBA1C 8.3* 01/14/2013   HGBA1C 8.5* 09/21/2012   HGBA1C 8.2* 04/08/2012  She had a steroid inj in back >> sugars 500s.  Pt is on a regimen of: - Metformin 1000 mg po bid - Lantus 40 units qhs - vials - Regular SSI units tid ac - only if sugars high >> over 250: draws a little in the needle (? Amount). She is on the doughnut hole.  Pt checks her sugars 3x a day and they are: - am: 90-197 - 2h after b'fast: n/a - before lunch: 120-200s - 2h after lunch: n/a - before dinner: n/a - 2h after dinner: n/a - bedtime: 200s - nighttime: 3-4 am: 39-40  Drops sugars especially after chocolate. Lost 10 lbs recently by dieting and exercise.  Has lows. Lowest sugar was 39 very rarely >> at night, wakes up sweating; she has hypoglycemia awareness at 60.  Highest sugar was 550 (after steroids this summer).   She exercises on the stepper every day for few minutes at a time.  - no CKD, last BUN/creatinine:  Lab Results  Component Value Date   BUN 16 01/14/2013   CREATININE 0.7 01/14/2013  She is on Lisinopril.  - last set of lipids: Lab Results  Component Value Date   CHOL 195 01/14/2013   HDL 42.00 01/14/2013   LDLCALC  Value: 62        Total Cholesterol/HDL:CHD Risk Coronary Heart Disease Risk Table                     Men   Women  1/2 Average Risk   3.4   3.3  Average Risk       5.0   4.4  2 X Average Risk   9.6   7.1  3 X Average Risk  23.4   11.0        Use the calculated Patient Ratio above and the CHD Risk Table to determine the patient's CHD Risk.        ATP III CLASSIFICATION  (LDL):  <100     mg/dL   Optimal  409-811  mg/dL   Near or Above                    Optimal  130-159  mg/dL   Borderline  914-782  mg/dL   High  >956     mg/dL   Very High 07/03/863   LDLDIRECT 92.7 01/14/2013   TRIG 343.0* 01/14/2013   CHOLHDL 5 01/14/2013  She is on Zocor. She is on ASA 81. - last eye exam was last year - cannot afford it this year. No DR.  - + numbness and tingling in her feet. On Neurontin.  Pt has FH of DM in daughter.  ROS: Constitutional: no weight gain/loss, no fatigue, no subjective hyperthermia/hypothermia Eyes: no blurry vision, no xerophthalmia ENT: no sore throat, no nodules palpated in throat, no dysphagia/odynophagia, no hoarseness Cardiovascular: no CP/SOB/palpitations/leg swelling Respiratory: no cough/SOB Gastrointestinal: no N/V/D/C Musculoskeletal: no muscle/joint aches Skin: no rashes Neurological: no tremors/numbness/tingling/dizziness Psychiatric: no depression/anxiety  Past  Medical History  Diagnosis Date  . DM (diabetes mellitus)   . Atrial flutter   . Dyslipidemia   . CAD (coronary artery disease)   . Osteoarthritis   . Hypothyroidism     hx  . Rheumatoid arthritis(714.0)   . Obesity   . GERD (gastroesophageal reflux disease)   . Depression   . Shoulder fracture, left     Dr. Martha Clan   Past Surgical History  Procedure Laterality Date  . Rca stent placement  2000  . Restenosis with ptca placement  2005  . Cataract surgery    . Partial hysterectomy    . Replacement total knee  2013    right  . Shoulder surgery  02/04/13   History   Social History  . Marital Status: Widowed    Spouse Name: N/A    Number of Children: 3   Occupational History  . retired   Social History Main Topics  . Smoking status: Never Smoker   . Smokeless tobacco: Not on file     Comment: former passive smoker  . Alcohol Use: No  . Drug Use: No   Social History Narrative   Widow; lives in [place]. Three children. 4 grandchildren; 4 great  grandchildren   Regular exercise: yes   Caffeine use: tea occasionally   Current Outpatient Prescriptions on File Prior to Visit  Medication Sig Dispense Refill  . amLODipine (NORVASC) 10 MG tablet Take 1 tablet (10 mg total) by mouth daily.  90 tablet  2  . aspirin 81 MG tablet Take by mouth daily.       . cyclobenzaprine (FLEXERIL) 5 MG tablet Take 1 tablet (5 mg total) by mouth 3 (three) times daily as needed.  30 tablet  0  . ferrous sulfate 325 (65 FE) MG tablet Take 325 mg by mouth daily with breakfast.      . fluticasone (FLONASE) 50 MCG/ACT nasal spray Place 2 sprays into the nose daily.  16 g  3  . furosemide (LASIX) 40 MG tablet Take 1 tablet (40 mg total) by mouth 2 (two) times daily.  180 tablet  3  . gabapentin (NEURONTIN) 100 MG capsule Take 1 capsule (100 mg total) by mouth 3 (three) times daily.  270 capsule  3  . gentamicin cream (GARAMYCIN) 0.1 % Apply topically 3 (three) times daily.  15 g  0  . HYDROcodone-acetaminophen (NORCO/VICODIN) 5-325 MG per tablet Take 1 tablet by mouth every 6 (six) hours as needed for pain.  30 tablet  0  . insulin glargine (LANTUS) 100 UNIT/ML injection Inject 50 Units into the skin at bedtime.  30 mL  3  . insulin regular (NOVOLIN R RELION) 100 units/mL injection INJECT  SUBCUTANEOUSLY 4  UNITS  IF  BLOOD  SUGAR  IS  GREATER  THAN  250  10 mL  2  . isosorbide mononitrate (ISMO,MONOKET) 10 MG tablet Take 1 tablet (10 mg total) by mouth 2 (two) times daily.  180 tablet  3  . latanoprost (XALATAN) 0.005 % ophthalmic solution Place 1 drop into both eyes at bedtime.       Marland Kitchen levothyroxine (LEVOTHROID) 150 MCG tablet Take 1 tablet (150 mcg total) by mouth daily.  90 tablet  3  . lisinopril (PRINIVIL,ZESTRIL) 10 MG tablet Take 1 tablet (10 mg total) by mouth daily.  90 tablet  3  . meclizine (ANTIVERT) 25 MG tablet Take 1 tablet (25 mg total) by mouth daily. As needed for dizziness or nausea  90 tablet  3  . metFORMIN (GLUCOPHAGE) 500 MG tablet Take 1  tablet (500 mg total) by mouth 2 (two) times daily.  180 tablet  3  . metoprolol tartrate (LOPRESSOR) 25 MG tablet Take 25 mg by mouth 2 (two) times daily as needed.      . nitroGLYCERIN (NITROSTAT) 0.4 MG SL tablet Place 1 tablet (0.4 mg total) under the tongue every 5 (five) minutes as needed.  25 tablet  3  . nystatin cream (MYCOSTATIN) Apply topically 2 (two) times daily.  30 g  0  . nystatin-triamcinolone (MYCOLOG II) cream Apply 1 application topically 2 (two) times daily as needed.  30 g  1  . omeprazole (PRILOSEC) 20 MG capsule Take 1 capsule (20 mg total) by mouth 2 (two) times daily.  180 capsule  3  . potassium chloride SA (K-DUR,KLOR-CON) 20 MEQ tablet Take 1 tablet (20 mEq total) by mouth 2 (two) times daily.  180 tablet  3  . simvastatin (ZOCOR) 40 MG tablet Take 1 tablet (40 mg total) by mouth at bedtime.  90 tablet  3  . traMADol (ULTRAM) 50 MG tablet Take 1 tablet (50 mg total) by mouth every 8 (eight) hours as needed for pain.  90 tablet  3   No current facility-administered medications on file prior to visit.   Allergies  Allergen Reactions  . Oxycodone Nausea And Vomiting   Family History  Problem Relation Age of Onset  . Esophageal cancer Mother   . Diabetes Mother   . Cancer Mother     esophageal    PE: BP 130/64  Pulse 84  Temp(Src) 98 F (36.7 C) (Oral)  Resp 12  Ht 5' 3.5" (1.613 m)  Wt 215 lb (97.523 kg)  BMI 37.48 kg/m2  SpO2 95% Wt Readings from Last 3 Encounters:  03/31/13 215 lb (97.523 kg)  03/09/13 227 lb 8 oz (103.193 kg)  01/28/13 225 lb 8 oz (102.286 kg)   Constitutional: overweight, in NAD Eyes: PERRLA, EOMI, no exophthalmos ENT: moist mucous membranes, no thyromegaly, no cervical lymphadenopathy Cardiovascular: RRR, No MRG Respiratory: CTA B Gastrointestinal: abdomen soft, NT, ND, BS+ Musculoskeletal: no deformities, strength - could not check in arms 2/2 bilateral shoulder pain Skin: moist, warm, no rashes Neurological: no tremor  with outstretched hands, DTR normal in all 4  ASSESSMENT: 1. DM2, insulin-dependent, uncontrolled, with complications - CAD, s/p stents - PN  PLAN:  1. Patient with long-standing, recently more uncontrolled diabetes, on oral antidiabetic regimen, which became insufficient - We discussed about options for treatment, and I suggested to:  Please return in 1 month with your sugar log.  Start 5 mg Tradjenta in am, before breakfast. I am not sure if this would work for her >> I think she probably will need mealtime insulin soon. Continue Lantus, but decrease to 36 units at night. - samples of Tradjenta given - continue checking sugars at different times of the day - check 3 times a day, rotating checks - given sugar log and advised how to fill it and to bring it at next appt  - given foot care handout and explained the principles  - given instructions for hypoglycemia management "15-15 rule"  - advised for yearly eye exams - had the flu vaccine this season - Return to clinic in 1 mo with sugar log

## 2013-04-01 ENCOUNTER — Encounter: Payer: Self-pay | Admitting: Internal Medicine

## 2013-04-01 ENCOUNTER — Telehealth: Payer: Self-pay | Admitting: Internal Medicine

## 2013-04-01 NOTE — Telephone Encounter (Signed)
Fwd to Dr. Walker 

## 2013-04-01 NOTE — Telephone Encounter (Signed)
Patient called in regarding blood pressure. Attempted to contact patient, there was no answer.

## 2013-04-01 NOTE — Telephone Encounter (Signed)
Patient Information:  Caller Name: [name] [name]  Phone: [phone]  Patient: [name] [name]  Gender: Female  DOB: [birth year]  Age: 78 Years  PCP: Ronna Polio (Adults only)  Office Follow Up:  Does the office need to follow up with this patient?: No  Instructions For The Office: N/A  RN Note:  No current symptoms. Denies weakness,fatigue, or dizziness.  Called for information about what numbers are too low and what is normal or too high.  Advised to call if systolic < 90 or diastolic < 60.  BP should be <140/<90.  Encouraged to hydrate and be active to > blood pressure.  Call if symptomatic or other questions.  Symptoms  Reason For Call & Symptoms: Asking if BP numbers are OK. BP 114/56 left arm at 1130.  BP 128/63, P 68 now at 13:00.  Yesterday,03/31/13,  BP was 136/67 at endocrinologist office.  FBS 197 at 0930.  Reviewed Health History In EMR: Yes  Reviewed Medications In EMR: Yes  Reviewed Allergies In EMR: Yes  Reviewed Surgeries / Procedures: Yes  Date of Onset of Symptoms: 04/01/2013  Treatments Tried: Drank some coffee  Treatments Tried Worked: Yes  Guideline(s) Used:  Weakness (Generalized) and Fatigue  No Protocol Available - Information Only  Disposition Per Guideline:   Home Care  Reason For Disposition Reached:   Information only question and nurse able to answer  Advice Given:  N/A  Patient Will Follow Care Advice:  YES

## 2013-04-28 ENCOUNTER — Other Ambulatory Visit: Payer: Self-pay | Admitting: *Deleted

## 2013-04-28 DIAGNOSIS — I10 Essential (primary) hypertension: Secondary | ICD-10-CM

## 2013-04-28 MED ORDER — LISINOPRIL 10 MG PO TABS: 10.0000 mg | ORAL_TABLET | Freq: Every day | ORAL | Status: DC

## 2013-04-28 NOTE — Telephone Encounter (Signed)
Patient called stating she was out of her Lisinopril and would like a refill. She is in the doughnut hole and would like prescription sent to local pharmacy. Rx sent to the pharmacy.

## 2013-05-05 ENCOUNTER — Ambulatory Visit: Payer: Medicare Other | Admitting: Internal Medicine

## 2013-05-05 DIAGNOSIS — Z0289 Encounter for other administrative examinations: Secondary | ICD-10-CM

## 2013-05-12 ENCOUNTER — Ambulatory Visit: Payer: Medicare Other | Admitting: Internal Medicine

## 2013-05-12 DIAGNOSIS — Z0289 Encounter for other administrative examinations: Secondary | ICD-10-CM

## 2013-05-17 ENCOUNTER — Observation Stay: Payer: Self-pay | Admitting: Internal Medicine

## 2013-05-17 ENCOUNTER — Telehealth: Payer: Self-pay | Admitting: Internal Medicine

## 2013-05-17 LAB — CBC WITH DIFFERENTIAL/PLATELET
Eosinophil #: 0.1 10*3/uL (ref 0.0–0.7)
Eosinophil %: 1.2 %
HCT: 35.5 % (ref 35.0–47.0)
Lymphocyte #: 2 10*3/uL (ref 1.0–3.6)
MCH: 29.6 pg (ref 26.0–34.0)
Monocyte %: 8.4 %
Neutrophil %: 68.3 %
RBC: 4.08 10*6/uL (ref 3.80–5.20)
WBC: 9.8 10*3/uL (ref 3.6–11.0)

## 2013-05-17 LAB — BASIC METABOLIC PANEL
Anion Gap: 7 (ref 7–16)
BUN: 19 mg/dL — ABNORMAL HIGH (ref 7–18)
Calcium, Total: 9.5 mg/dL (ref 8.5–10.1)
Co2: 24 mmol/L (ref 21–32)
Creatinine: 0.78 mg/dL (ref 0.60–1.30)
Glucose: 179 mg/dL — ABNORMAL HIGH (ref 65–99)
Osmolality: 286 (ref 275–301)
Potassium: 3.8 mmol/L (ref 3.5–5.1)
Sodium: 140 mmol/L (ref 136–145)

## 2013-05-17 NOTE — Telephone Encounter (Signed)
FYI to Dr. Walker 

## 2013-05-17 NOTE — Telephone Encounter (Signed)
Patient Information:  Caller Name: Higinio Plan  Phone: [phone]  Patient: [name] [name]  Gender: Female  DOB: 25-Jan-1935  Age: 78 Years  PCP: Ronna Polio (Adults only)  Office Follow Up:  Does the office need to follow up with this patient?: No  Instructions For The Office: N/A  RN Note:  Pt feels faint, and feels like ton of bricks on chest with SOB. Pt is by herself. RN advised to hang up and dial 911 now - Pt states she is going to call her neighbor to take her to ED  Symptoms  Reason For Call & Symptoms: Today, 05/17/2013, pt calling with c/o of SOB and high BP  since 05/13/2013. This morning BP 208/99 ,  Pulse 68.  Reviewed Health History In EMR: Yes  Reviewed Medications In EMR: Yes  Reviewed Allergies In EMR: Yes  Reviewed Surgeries / Procedures: Yes  Date of Onset of Symptoms: 05/13/2013  Guideline(s) Used:  Chest Pain  Disposition Per Guideline:   Call EMS 911 Now  Reason For Disposition Reached:   Severe difficulty breathing (e.g., struggling for each breath, speaks in single words)  Advice Given:  N/A  Patient Refused Recommendation:  Patient Will Go To ED  Pt declined calling 911 and is going to see if her neighbor will take her to ED instead

## 2013-05-18 DIAGNOSIS — R0609 Other forms of dyspnea: Secondary | ICD-10-CM

## 2013-05-18 DIAGNOSIS — R079 Chest pain, unspecified: Secondary | ICD-10-CM

## 2013-05-18 DIAGNOSIS — R0989 Other specified symptoms and signs involving the circulatory and respiratory systems: Secondary | ICD-10-CM

## 2013-05-18 DIAGNOSIS — I10 Essential (primary) hypertension: Secondary | ICD-10-CM

## 2013-05-18 LAB — BASIC METABOLIC PANEL
Calcium, Total: 8.7 mg/dL (ref 8.5–10.1)
Chloride: 108 mmol/L — ABNORMAL HIGH (ref 98–107)
Creatinine: 0.87 mg/dL (ref 0.60–1.30)
EGFR (Non-African Amer.): >60
Potassium: 3.8 mmol/L (ref 3.5–5.1)

## 2013-05-18 LAB — LIPID PANEL
Cholesterol: 170 mg/dL (ref 0–200)
Triglycerides: 133 mg/dL (ref 0–200)

## 2013-05-19 ENCOUNTER — Telehealth: Payer: Self-pay | Admitting: *Deleted

## 2013-05-19 NOTE — Telephone Encounter (Signed)
Patient contacted regarding discharge from [facility] on 05/18/13.  Patient understands to follow up with provider Gollan on 05/24/13 at 3:30pm at [facility]. Patient understands discharge instructions? yes Patient understands medications and regiment? yes Patient understands to bring all medications to this visit? yes

## 2013-05-21 ENCOUNTER — Telehealth: Payer: Self-pay

## 2013-05-21 NOTE — Telephone Encounter (Signed)
Pt called stating that her BP this am is 161/76, pulse 66. She reports that she feels fine but that the "little heart on my monitor is wiggling" and she is unclear as to what this means. Pt states that her monitor is old and she no longer has the paperwork for it.  She requests that we check it at her appt on 05/24/13. Reports that she feels fine, but her ears are ringing more than usual this morning. Reports longstanding h/o tinnitus, but "it's so loud this morning, I can't hardly hear over it."   Took meclizine around 9:30 and states that she is feeling better but doesn't think it's "completely kicked in yet." Reports BG this am of 88, she ate breakfast and now feels better.  Pt states that she will keep her TCM appt w/ [place] on Monday.

## 2013-05-21 NOTE — Telephone Encounter (Signed)
This encounter was created in error - please disregard.

## 2013-05-24 ENCOUNTER — Encounter: Payer: Self-pay | Admitting: Cardiovascular Disease

## 2013-05-24 ENCOUNTER — Ambulatory Visit (INDEPENDENT_AMBULATORY_CARE_PROVIDER_SITE_OTHER): Payer: Commercial Managed Care - HMO | Admitting: Cardiovascular Disease

## 2013-05-24 VITALS — BP 140/70 | HR 60 | Ht 63.0 in | Wt 227.0 lb

## 2013-05-24 DIAGNOSIS — E1165 Type 2 diabetes mellitus with hyperglycemia: Secondary | ICD-10-CM

## 2013-05-24 DIAGNOSIS — I10 Essential (primary) hypertension: Secondary | ICD-10-CM

## 2013-05-24 DIAGNOSIS — I4892 Unspecified atrial flutter: Secondary | ICD-10-CM

## 2013-05-24 DIAGNOSIS — E785 Hyperlipidemia, unspecified: Secondary | ICD-10-CM

## 2013-05-24 DIAGNOSIS — E1159 Type 2 diabetes mellitus with other circulatory complications: Secondary | ICD-10-CM

## 2013-05-24 DIAGNOSIS — I2510 Atherosclerotic heart disease of native coronary artery without angina pectoris: Secondary | ICD-10-CM

## 2013-05-24 NOTE — Patient Instructions (Signed)
You are doing well. No medication changes were made. Go back on the simvastatin  Please call us if you have new issues that need to be addressed before your next appt.  Your physician wants you to follow-up in: 6 months.  You will receive a reminder letter in the mail two months in advance. If you don't receive a letter, please call our office to schedule the follow-up appointment.

## 2013-05-24 NOTE — Assessment & Plan Note (Signed)
She reports she has been not been consistent with taking her simvastatin. We have stressed to her the importance of taking this daily. We'll continue simvastatin 40 mg daily

## 2013-05-24 NOTE — Progress Notes (Signed)
Patient ID: [name] [name], female    DOB: Jan 22, 1935, 79 y.o.   MRN: [id number]  HPI Comments: [name] [name] is a 79 year old woman with a history of CAD, status post PTCA/cutting balloon angioplasty for in-stent restenosis of the RCA,  Who presents for routine followup.  She was recently admitted to the hospital at the end of May 17 2013 with discharge 05/18/2013 with diagnosis of chest pain. She had evaluation by Dr. Rollene Rotunda . Cardiac enzymes were negative. She had stress test that showed no ischemia. She was discharged home with followup today. Total cholesterol 170, LDL 96. She reports that she has not been faithfully taking her simvastatin   episode of chest pain in 2012, admitted to the hospital, workup including cardiac enzymes and a stress test. Stress test showed no ischemia and she was discharged home She has significant orthopedic problems. She had a fall in the recent past, broke her left shoulder. Scheduled for surgery on September 18.  history of total right knee replacement,  also history of spinal stenosis. She has mild chronic shortness of breath. This is unchanged from previous visits. Previous Hemoglobin A1c 8.3. After cortisone shot  She denies any chest pain, anginal-type symptoms, no lightheadedness or dizziness since she has left the hospital. She walks with a cane  EKG shows normal sinus rhythm ,  rate of 60 and beats per minute , ST and T wave abnormality in lead one and aVL       Outpatient Encounter Prescriptions as of 05/24/2013  Medication Sig  . amLODipine (NORVASC) 10 MG tablet Take 1 tablet (10 mg total) by mouth daily.  Marland Kitchen aspirin 81 MG tablet Take by mouth daily.   . cyclobenzaprine (FLEXERIL) 5 MG tablet Take 1 tablet (5 mg total) by mouth 3 (three) times daily as needed.  . ferrous sulfate 325 (65 FE) MG tablet Take 325 mg by mouth daily with breakfast.  . fluticasone (FLONASE) 50 MCG/ACT nasal spray Place 2 sprays into the nose daily.  .  furosemide (LASIX) 40 MG tablet Take 1 tablet (40 mg total) by mouth 2 (two) times daily.  Marland Kitchen gabapentin (NEURONTIN) 100 MG capsule Take 1 capsule (100 mg total) by mouth 3 (three) times daily.  Marland Kitchen gentamicin cream (GARAMYCIN) 0.1 % Apply topically 3 (three) times daily.  Marland Kitchen HYDROcodone-acetaminophen (NORCO/VICODIN) 5-325 MG per tablet Take 1 tablet by mouth every 6 (six) hours as needed for pain.  Marland Kitchen insulin glargine (LANTUS) 100 UNIT/ML injection Inject 50 Units into the skin at bedtime.  . insulin regular (NOVOLIN R RELION) 100 units/mL injection INJECT  SUBCUTANEOUSLY 4  UNITS  IF  BLOOD  SUGAR  IS  GREATER  THAN  250  . isosorbide mononitrate (ISMO,MONOKET) 10 MG tablet Take 1 tablet (10 mg total) by mouth 2 (two) times daily.  Marland Kitchen latanoprost (XALATAN) 0.005 % ophthalmic solution Place 1 drop into both eyes at bedtime.   Marland Kitchen levothyroxine (LEVOTHROID) 150 MCG tablet Take 1 tablet (150 mcg total) by mouth daily.  Marland Kitchen linagliptin (TRADJENTA) 5 MG TABS tablet Take 1 tablet (5 mg total) by mouth daily.  Marland Kitchen lisinopril (PRINIVIL,ZESTRIL) 10 MG tablet Take 1 tablet (10 mg total) by mouth daily.  . meclizine (ANTIVERT) 25 MG tablet Take 1 tablet (25 mg total) by mouth daily. As needed for dizziness or nausea  . metFORMIN (GLUCOPHAGE) 500 MG tablet Take 1 tablet (500 mg total) by mouth 2 (two) times daily.  . metoprolol tartrate (LOPRESSOR) 25 MG tablet Take  25 mg by mouth 2 (two) times daily as needed.  . nitroGLYCERIN (NITROSTAT) 0.4 MG SL tablet Place 1 tablet (0.4 mg total) under the tongue every 5 (five) minutes as needed.  . nystatin cream (MYCOSTATIN) Apply topically 2 (two) times daily.  Marland Kitchen nystatin-triamcinolone (MYCOLOG II) cream Apply 1 application topically 2 (two) times daily as needed.  Marland Kitchen omeprazole (PRILOSEC) 20 MG capsule Take 1 capsule (20 mg total) by mouth 2 (two) times daily.  . potassium chloride SA (K-DUR,KLOR-CON) 20 MEQ tablet Take 1 tablet (20 mEq total) by mouth 2 (two) times daily.  .  simvastatin (ZOCOR) 40 MG tablet Take 1 tablet (40 mg total) by mouth at bedtime.  . traMADol (ULTRAM) 50 MG tablet Take 1 tablet (50 mg total) by mouth every 8 (eight) hours as needed for pain.    Review of Systems  Constitutional: Negative.   HENT: Negative.   Eyes: Negative.   Respiratory: Positive for shortness of breath.   Cardiovascular: Negative.   Gastrointestinal: Negative.   Endocrine: Negative.   Musculoskeletal: Positive for arthralgias, back pain, gait problem and joint swelling.  Skin: Negative.   Allergic/Immunologic: Negative.   Neurological: Negative.   Hematological: Negative.   Psychiatric/Behavioral: Negative.   All other systems reviewed and are negative.    BP 140/70  Pulse 60  Ht 5\' 3"  (1.6 m)  Wt 227 lb (102.967 kg)  BMI 40.22 kg/m2  Physical Exam  Nursing note and vitals reviewed. Constitutional: She is oriented to person, place, and time. She appears well-developed and well-nourished.  Obese  HENT:  Head: Normocephalic.  Nose: Nose normal.  Mouth/Throat: Oropharynx is clear and moist.  Eyes: Conjunctivae are normal. Pupils are equal, round, and reactive to light.  Neck: Normal range of motion. Neck supple. No JVD present.  Cardiovascular: Normal rate, regular rhythm, S1 normal, S2 normal, normal heart sounds and intact distal pulses.  Exam reveals no gallop and no friction rub.   No murmur heard. Pulmonary/Chest: Effort normal and breath sounds normal. No respiratory distress. She has no wheezes. She has no rales. She exhibits no tenderness.  Abdominal: Soft. Bowel sounds are normal. She exhibits no distension. There is no tenderness.  Musculoskeletal: Normal range of motion. She exhibits no edema and no tenderness.  Lymphadenopathy:    She has no cervical adenopathy.  Neurological: She is alert and oriented to person, place, and time. Coordination normal.  Skin: Skin is warm and dry. No rash noted. No erythema.  Psychiatric: She has a normal  mood and affect. Her behavior is normal. Judgment and thought content normal.    Assessment and Plan

## 2013-05-24 NOTE — Assessment & Plan Note (Signed)
Reason he did chest pain. Stress test showing no ischemia. Cardiac enzymes negative. No further workup at this time

## 2013-05-24 NOTE — Assessment & Plan Note (Signed)
Blood pressure is well controlled on today's visit. No changes made to the medications. 

## 2013-05-24 NOTE — Assessment & Plan Note (Signed)
We have encouraged continued exercise, careful diet management in an effort to lose weight. 

## 2013-05-31 ENCOUNTER — Encounter: Payer: Self-pay | Admitting: *Deleted

## 2013-06-02 ENCOUNTER — Telehealth: Payer: Self-pay | Admitting: Internal Medicine

## 2013-06-02 NOTE — Telephone Encounter (Signed)
[facility] discharged 2 week ago/[facility]  Pt stated she still didn't feel good. I wanted to sendher triage to talk to a nurse who could ask more clinical questions pt refused to talk to triage nurse  Hospital follow up 06/11/12

## 2013-06-03 NOTE — Telephone Encounter (Signed)
We could try combining two visits on Monday to make a slot for her?

## 2013-06-03 NOTE — Telephone Encounter (Signed)
Called and spoke with patient, she state she is doing fine however she would like to be seen sooner than next Friday.

## 2013-06-07 ENCOUNTER — Telehealth: Payer: Self-pay

## 2013-06-07 ENCOUNTER — Telehealth: Payer: Self-pay | Admitting: *Deleted

## 2013-06-07 NOTE — Telephone Encounter (Signed)
Pt called and left message that her BP came down to 127/49.  Called back and spoke w/ pt.   She reports that she just took her BP and it is 147/57.  Pt reports that she has a bulging disc in her neck that her "other" doctor other she "needs to get taken care of". Advised pt that pain can send BP up.  She reports that feels better now and that she will contact her PCP about the possibility of surgery for her neck and see if this helps her BP, as "that would be great, especially at my age". Advised pt to contact us with any other questions or concerns.

## 2013-06-07 NOTE — Telephone Encounter (Signed)
Patient called and her bp is rising. This morning it's at 180/77 and last night 158/70. Patient is sob. Please advise

## 2013-06-07 NOTE — Telephone Encounter (Signed)
Spoke w/ pt.  She reports that her BP was 180/77 an hour ago and she was told to call the office if systolic > 170. Pt is audibly sob, states that she was just running around letting the dogs in and uncovering the bird.  Advised pt take her am meds, relax for a little bit and recheck BP. She reports that BP was elevated in the 140-150s systolic over the weekend. Reports that her "heart sounds sluggish like a washing machine" but it has been like that for about 4 years, since a procedure she had done before she arrived in our area. Reports that she is frequently sob and her chest feels heavy at times.  Denies chest pain, n/v, or sweating. Asked pt to come in today to see Alinda Money, [place], but she reports that she does not have a ride, as her friend who normally drives her had to work today. Advised pt to see if she could get a ride and call back to schedule appt for today or tomorrow. Pt verbalizes understanding that if sob worsens or is accompanied by cp, n/v or sweating, she is to call 911. Pt reports that she will take her am meds, rest for about an hour, recheck her BP and call back if it has not come down.

## 2013-06-09 NOTE — Telephone Encounter (Signed)
No available slots were available for combining. Friday was the earliest.

## 2013-06-10 LAB — CBC
HCT: 34.7 % — AB (ref 35.0–47.0)
HGB: 11.5 g/dL — AB (ref 12.0–16.0)
MCH: 29.1 pg (ref 26.0–34.0)
MCHC: 33.1 g/dL (ref 32.0–36.0)
MCV: 88 fL (ref 80–100)
Platelet: 337 10*3/uL (ref 150–440)
RBC: 3.95 10*6/uL (ref 3.80–5.20)
RDW: 13.6 % (ref 11.5–14.5)
WBC: 11.6 10*3/uL — ABNORMAL HIGH (ref 3.6–11.0)

## 2013-06-10 LAB — BASIC METABOLIC PANEL
Anion Gap: 6 — ABNORMAL LOW (ref 7–16)
BUN: 27 mg/dL — ABNORMAL HIGH (ref 7–18)
CALCIUM: 9.2 mg/dL (ref 8.5–10.1)
Chloride: 103 mmol/L (ref 98–107)
Co2: 27 mmol/L (ref 21–32)
Creatinine: 1.23 mg/dL (ref 0.60–1.30)
EGFR (Non-African Amer.): 43 — ABNORMAL LOW
GFR CALC AF AMER: 49 — AB
Glucose: 125 mg/dL — ABNORMAL HIGH (ref 65–99)
Osmolality: 279 (ref 275–301)
Potassium: 3.9 mmol/L (ref 3.5–5.1)
Sodium: 136 mmol/L (ref 136–145)

## 2013-06-10 LAB — TROPONIN I: Troponin-I: <0.02 ng/mL

## 2013-06-10 LAB — PRO B NATRIURETIC PEPTIDE: B-Type Natriuretic Peptide: 405 pg/mL (ref 0–450)

## 2013-06-11 ENCOUNTER — Observation Stay: Payer: Self-pay | Admitting: Internal Medicine

## 2013-06-11 ENCOUNTER — Ambulatory Visit: Payer: 59 | Admitting: Internal Medicine

## 2013-06-11 DIAGNOSIS — R079 Chest pain, unspecified: Secondary | ICD-10-CM

## 2013-06-11 DIAGNOSIS — I10 Essential (primary) hypertension: Secondary | ICD-10-CM

## 2013-06-11 DIAGNOSIS — R0989 Other specified symptoms and signs involving the circulatory and respiratory systems: Secondary | ICD-10-CM

## 2013-06-11 DIAGNOSIS — I2510 Atherosclerotic heart disease of native coronary artery without angina pectoris: Secondary | ICD-10-CM

## 2013-06-11 DIAGNOSIS — E785 Hyperlipidemia, unspecified: Secondary | ICD-10-CM

## 2013-06-11 DIAGNOSIS — R0609 Other forms of dyspnea: Secondary | ICD-10-CM

## 2013-06-11 HISTORY — PX: CARDIAC CATHETERIZATION: SHX172

## 2013-06-11 LAB — TROPONIN I: Troponin-I: <0.02 ng/mL

## 2013-06-11 LAB — CK TOTAL AND CKMB (NOT AT ARMC)
CK, Total: 49 U/L (ref 21–215)
CK, Total: 40 U/L (ref 21–215)
CK, Total: 43 U/L (ref 21–215)
CK-MB: 0.9 ng/mL (ref 0.5–3.6)
CK-MB: 0.5 ng/mL (ref 0.5–3.6)
CK-MB: 0.8 ng/mL (ref 0.5–3.6)

## 2013-06-12 DIAGNOSIS — I2510 Atherosclerotic heart disease of native coronary artery without angina pectoris: Secondary | ICD-10-CM

## 2013-06-14 ENCOUNTER — Telehealth: Payer: Self-pay | Admitting: Internal Medicine

## 2013-06-14 ENCOUNTER — Encounter: Payer: Self-pay | Admitting: Cardiovascular Disease

## 2013-06-14 NOTE — Telephone Encounter (Signed)
Pt was recently at [facility] and missed her appointment last Friday. I scheduled her for Raquel on Wednesday for a pain in her side that has just come up after she left the hospital but she will also need a hospital f/u but Dr. Dan Humphreys is out until March ??? Please advise

## 2013-06-15 NOTE — Telephone Encounter (Signed)
Please read below and advise.

## 2013-06-15 NOTE — Telephone Encounter (Signed)
We can use the Thursday Feb 12th hospital follow up slot.

## 2013-06-16 ENCOUNTER — Ambulatory Visit (INDEPENDENT_AMBULATORY_CARE_PROVIDER_SITE_OTHER): Payer: Medicare HMO | Admitting: Adult Health

## 2013-06-16 ENCOUNTER — Encounter: Payer: Self-pay | Admitting: Adult Health

## 2013-06-16 ENCOUNTER — Ambulatory Visit (INDEPENDENT_AMBULATORY_CARE_PROVIDER_SITE_OTHER)
Admission: RE | Admit: 2013-06-16 | Discharge: 2013-06-16 | Disposition: A | Discharge status: Home / Self Care | Payer: Medicare HMO | Source: Ambulatory Visit | Attending: Adult Health | Admitting: Adult Health

## 2013-06-16 VITALS — BP 122/60 | HR 63 | Temp 97.7°F | Resp 12 | Wt 223.0 lb

## 2013-06-16 DIAGNOSIS — R109 Unspecified abdominal pain: Secondary | ICD-10-CM

## 2013-06-16 LAB — POCT URINALYSIS DIPSTICK
Bilirubin, UA: NEGATIVE
Blood, UA: NEGATIVE
Glucose, UA: NEGATIVE
Ketones, UA: NEGATIVE
NITRITE UA: NEGATIVE
PH UA: 5
Spec Grav, UA: 1.015
UROBILINOGEN UA: 0.2

## 2013-06-16 MED ORDER — NITROGLYCERIN 0.4 MG SL SUBL: 0.4000 mg | SUBLINGUAL_TABLET | SUBLINGUAL | Status: DC | PRN

## 2013-06-16 MED ORDER — CIPROFLOXACIN HCL 250 MG PO TABS: 250.0000 mg | ORAL_TABLET | Freq: Two times a day (BID) | ORAL | Status: DC

## 2013-06-16 NOTE — Assessment & Plan Note (Addendum)
UA shows possible UTI. Send for culture. Start Cipro 250 mg twice a day for 7 days. Send to [facility] for KUB - ? Kidney stone vs infection.

## 2013-06-16 NOTE — Progress Notes (Signed)
Pre visit review using our clinic review tool, if applicable. No additional management support is needed unless otherwise documented below in the visit note. 

## 2013-06-16 NOTE — Progress Notes (Signed)
Subjective:    Patient ID: [name] [name], female    DOB: [birth year], 79 y.o.   MRN: [id number]  HPI Patient is a 79 year old female who presents to clinic with right flank pain that radiates to the front lower abdomen. She reports that pain is worse with movement. She denies any activity or injury that may have caused her discomfort. Pain has been ongoing x 3 weeks. She reports she is having incontinence and frequency. Denies dysuria.  Current Outpatient Prescriptions on File Prior to Visit  Medication Sig Dispense Refill  . amLODipine (NORVASC) 10 MG tablet Take 1 tablet (10 mg total) by mouth daily.  90 tablet  2  . aspirin 81 MG tablet Take by mouth daily.       . cyclobenzaprine (FLEXERIL) 5 MG tablet Take 1 tablet (5 mg total) by mouth 3 (three) times daily as needed.  30 tablet  0  . ferrous sulfate 325 (65 FE) MG tablet Take 325 mg by mouth daily with breakfast.      . fluticasone (FLONASE) 50 MCG/ACT nasal spray Place 2 sprays into the nose daily.  16 g  3  . furosemide (LASIX) 40 MG tablet Take 1 tablet (40 mg total) by mouth 2 (two) times daily.  180 tablet  3  . gabapentin (NEURONTIN) 100 MG capsule Take 1 capsule (100 mg total) by mouth 3 (three) times daily.  270 capsule  3  . gentamicin cream (GARAMYCIN) 0.1 % Apply topically 3 (three) times daily.  15 g  0  . HYDROcodone-acetaminophen (NORCO/VICODIN) 5-325 MG per tablet Take 1 tablet by mouth every 6 (six) hours as needed for pain.  30 tablet  0  . insulin regular (NOVOLIN R RELION) 100 units/mL injection INJECT  SUBCUTANEOUSLY 4  UNITS  IF  BLOOD  SUGAR  IS  GREATER  THAN  250  10 mL  2  . isosorbide mononitrate (ISMO,MONOKET) 10 MG tablet Take 1 tablet (10 mg total) by mouth 2 (two) times daily.  180 tablet  3  . latanoprost (XALATAN) 0.005 % ophthalmic solution Place 1 drop into both eyes at bedtime.       Marland Kitchen levothyroxine (LEVOTHROID) 150 MCG tablet Take 1 tablet (150 mcg total) by mouth daily.  90 tablet  3  .  linagliptin (TRADJENTA) 5 MG TABS tablet Take 1 tablet (5 mg total) by mouth daily.  1 tablet  0  . lisinopril (PRINIVIL,ZESTRIL) 10 MG tablet Take 1 tablet (10 mg total) by mouth daily.  90 tablet  0  . meclizine (ANTIVERT) 25 MG tablet Take 1 tablet (25 mg total) by mouth daily. As needed for dizziness or nausea  90 tablet  3  . metFORMIN (GLUCOPHAGE) 500 MG tablet Take 1 tablet (500 mg total) by mouth 2 (two) times daily.  180 tablet  3  . nystatin cream (MYCOSTATIN) Apply topically 2 (two) times daily.  30 g  0  . nystatin-triamcinolone (MYCOLOG II) cream Apply 1 application topically 2 (two) times daily as needed.  30 g  1  . potassium chloride SA (K-DUR,KLOR-CON) 20 MEQ tablet Take 1 tablet (20 mEq total) by mouth 2 (two) times daily.  180 tablet  3  . traMADol (ULTRAM) 50 MG tablet Take 1 tablet (50 mg total) by mouth every 8 (eight) hours as needed for pain.  90 tablet  3  . insulin glargine (LANTUS) 100 UNIT/ML injection Inject 50 Units into the skin at bedtime.  30 mL  3  .  metoprolol tartrate (LOPRESSOR) 25 MG tablet Take 25 mg by mouth 2 (two) times daily as needed.      Marland Kitchen omeprazole (PRILOSEC) 20 MG capsule Take 1 capsule (20 mg total) by mouth 2 (two) times daily.  180 capsule  3  . simvastatin (ZOCOR) 40 MG tablet Take 1 tablet (40 mg total) by mouth at bedtime.  90 tablet  3   No current facility-administered medications on file prior to visit.    Review of Systems  Constitutional: Negative for fever and chills.  HENT: Negative.   Respiratory: Negative.   Cardiovascular: Negative.   Genitourinary: Positive for urgency, frequency, flank pain and difficulty urinating. Negative for dysuria, hematuria and pelvic pain.       Incontinence  Neurological: Negative.   Psychiatric/Behavioral: Negative.   All other systems reviewed and are negative.       Objective:   Physical Exam  Constitutional: She is oriented to person, place, and time.  Appears uncomfortable    Cardiovascular: Normal rate and regular rhythm.   Pulmonary/Chest: Effort normal. No respiratory distress.  Genitourinary:  Right costovertebral angle tenderness with palpation  Musculoskeletal:  Guarded movements.  Neurological: She is alert and oriented to person, place, and time.  Skin: Skin is warm and dry.  Psychiatric: She has a normal mood and affect. Her behavior is normal. Judgment and thought content normal.          Assessment & Plan:

## 2013-06-16 NOTE — Patient Instructions (Signed)
  February 12 at 2:00 pm is your appointment with Dr. Dan Humphreys  She will fill your medications at that visit.  Please go to the [facility] for you abdominal film.  We will call you once we get the results.

## 2013-06-17 ENCOUNTER — Telehealth: Payer: Self-pay | Admitting: *Deleted

## 2013-06-17 NOTE — Telephone Encounter (Signed)
Pt notified of results, see result note.  

## 2013-06-17 NOTE — Telephone Encounter (Signed)
Pt left message, wanting xray results

## 2013-06-21 ENCOUNTER — Ambulatory Visit (INDEPENDENT_AMBULATORY_CARE_PROVIDER_SITE_OTHER)
Admission: RE | Admit: 2013-06-21 | Discharge: 2013-06-21 | Disposition: A | Discharge status: Home / Self Care | Payer: Medicare HMO | Source: Ambulatory Visit | Attending: Family Medicine | Admitting: Family Medicine

## 2013-06-21 ENCOUNTER — Encounter: Payer: Self-pay | Admitting: Family Medicine

## 2013-06-21 ENCOUNTER — Ambulatory Visit (INDEPENDENT_AMBULATORY_CARE_PROVIDER_SITE_OTHER): Payer: Medicare HMO | Admitting: Family Medicine

## 2013-06-21 ENCOUNTER — Telehealth: Payer: Self-pay | Admitting: Internal Medicine

## 2013-06-21 VITALS — BP 118/78 | HR 68 | Temp 98.3°F | Wt 222.5 lb

## 2013-06-21 DIAGNOSIS — M25512 Pain in left shoulder: Secondary | ICD-10-CM

## 2013-06-21 DIAGNOSIS — R109 Unspecified abdominal pain: Secondary | ICD-10-CM

## 2013-06-21 DIAGNOSIS — M25519 Pain in unspecified shoulder: Secondary | ICD-10-CM

## 2013-06-21 MED ORDER — HYDROCODONE-ACETAMINOPHEN 5-325 MG PO TABS: 1.0000 | ORAL_TABLET | Freq: Four times a day (QID) | ORAL | Status: DC | PRN

## 2013-06-21 NOTE — Patient Instructions (Signed)
Use the pain medicine tonight and either we and/or your regular clinic will be in touch.  Take care.

## 2013-06-21 NOTE — Progress Notes (Signed)
Pre-visit discussion using our clinic review tool. No additional management support is needed unless otherwise documented below in the visit note.  3-4 weeks of episodic R flank pain.  Seen recently, started on cipro for presumed UTI, ucx not resulted yet.  No stones seen on KUB.  Still with R flank pain, no L sided sx.  No fevers, no vomiting. Some nausea last night.  No burning with urination.  No diarrhea.  No known h/o renal stones.  Now with a possible knot on the R flank, near the R midaxillary line in the lower abd, unclear how long it has been there, likely not longer than a few weeks.  Not improved on the cipro in the interval.   Meds, vitals, and allergies reviewed.   ROS: See HPI.  Otherwise, noncontributory.  nad Chronically ill appearing woman Mmm rrr ctab Abd soft, with likely lipoma on the R flank.   R flank ttp w/o focal mass. Normal BS No rebound.  No rash, no dermatomal changes on skin  Obese Ext w/o pitting edema  KUB noted, labs pending.

## 2013-06-21 NOTE — Assessment & Plan Note (Signed)
Unclear source, likely not emergent/urgent given the time line. Could be stone not visible on plain films. Uncomfortable but not acute abd.  Will check cbc and cmet, ucx prev ordered by primary clinic.  If labs unremarkable, would defer back to PCP's clinic for consideration of CT abd.  rx given for vicodin to use in meantime in the event that this is in the abd wall, ie from spine DJD or other nonacute spine/radicular source. Opiate caution given to patient.  >25 min spent with face to face with patient, >50% counseling and/or coordinating care.

## 2013-06-21 NOTE — Telephone Encounter (Signed)
Patient Information:  Caller Name: [name] [name]  Phone: [phone]  Patient: [name] [name]  Gender: Female  DOB: [birth year]  Age: 79 Years  PCP: Ronna Polio (Adults only)  Office Follow Up:  Does the office need to follow up with this patient?: Yes  Instructions For The Office: Please call pt with possible appt time. Refused ER. SHe is calling around looking for a ride.  RN Note:  Pt refuses ER disposition. States she will not go. Home alone and not sure if she can get a ride anywhere. Pt to call around and see if anyone can bring her to the office. Please call pt and let her know if office will see her.  Symptoms  Reason For Call & Symptoms: Pt calling regarding right side abdominal pain going around to back. "Hurts to move" and "feels big knot". No better than when seen last week in office on 06/16/13 for UTI.  Reviewed Health History In EMR: Yes  Reviewed Medications In EMR: Yes  Reviewed Allergies In EMR: Yes  Reviewed Surgeries / Procedures: Yes  Date of Onset of Symptoms: 06/14/2013  Treatments Tried: antibx for UTI  Treatments Tried Worked: No  Guideline(s) Used:  Abdominal Pain - Female  Abdominal Pain - Upper  Disposition Per Guideline:   Go to ED Now  Reason For Disposition Reached:   Severe abdominal pain (e.g., excruciating)  Advice Given:  N/A  Patient Will Follow Care Advice:  YES

## 2013-06-21 NOTE — Telephone Encounter (Signed)
Scheduling called pt and scheduled an appointment today at [facility] at 3:15.

## 2013-06-21 NOTE — Telephone Encounter (Signed)
RN/CAN let the phone ring for a long time, no answering machine to leave a message. RN/CAN was trying to f/u with incoming Emergency call regarding abdominal pain.

## 2013-06-22 LAB — COMPREHENSIVE METABOLIC PANEL
ALK PHOS: 80 U/L (ref 39–117)
ALT: 10 U/L (ref 0–35)
AST: 14 U/L (ref 0–37)
Albumin: 3.7 g/dL (ref 3.5–5.2)
BUN: 16 mg/dL (ref 6–23)
CO2: 26 mEq/L (ref 19–32)
CREATININE: 0.9 mg/dL (ref 0.4–1.2)
Calcium: 9.6 mg/dL (ref 8.4–10.5)
Chloride: 107 mEq/L (ref 96–112)
GFR: 69.08 mL/min (ref >60.00)
Glucose, Bld: 276 mg/dL — ABNORMAL HIGH (ref 70–99)
Potassium: 3.9 mEq/L (ref 3.5–5.1)
Sodium: 141 mEq/L (ref 135–145)
Total Bilirubin: 0.7 mg/dL (ref 0.3–1.2)
Total Protein: 7.7 g/dL (ref 6.0–8.3)

## 2013-06-22 LAB — CBC WITH DIFFERENTIAL/PLATELET
BASOS PCT: 0.3 % (ref 0.0–3.0)
Basophils Absolute: 0 10*3/uL (ref 0.0–0.1)
Eosinophils Absolute: 0.2 10*3/uL (ref 0.0–0.7)
Eosinophils Relative: 2.1 % (ref 0.0–5.0)
HCT: 34 % — ABNORMAL LOW (ref 36.0–46.0)
Hemoglobin: 11 g/dL — ABNORMAL LOW (ref 12.0–15.0)
LYMPHS PCT: 23.8 % (ref 12.0–46.0)
Lymphs Abs: 1.8 10*3/uL (ref 0.7–4.0)
MCHC: 32.3 g/dL (ref 30.0–36.0)
MCV: 90.6 fl (ref 78.0–100.0)
Monocytes Absolute: 0.5 10*3/uL (ref 0.1–1.0)
Monocytes Relative: 6.8 % (ref 3.0–12.0)
NEUTROS ABS: 5 10*3/uL (ref 1.4–7.7)
Neutrophils Relative %: 67 % (ref 43.0–77.0)
Platelets: 353 10*3/uL (ref 150.0–400.0)
RBC: 3.75 Mil/uL — AB (ref 3.87–5.11)
RDW: 13.9 % (ref 11.5–14.6)
WBC: 7.5 10*3/uL (ref 4.5–10.5)

## 2013-06-25 ENCOUNTER — Telehealth: Payer: Self-pay | Admitting: Internal Medicine

## 2013-06-25 NOTE — Telephone Encounter (Signed)
Patient reports no fever but has very sore throat  and a bad cough , tried to offer patient an appointment with Raquel Rey Np patient stated she has no transportation and ask if MD would call something in. Please advise.

## 2013-06-25 NOTE — Telephone Encounter (Signed)
She must be seen and evaluated prior to Rx.

## 2013-06-25 NOTE — Telephone Encounter (Signed)
Charmel/Patient calling about medicine - not sure what to take with very sore throat and cough that started yesterday 2/5.  Keeps feeling like something in throat, has slight cough but feels chest congestion.  Drinking hot coffee helped some temporarily.  Thinks slight difficulty breathing due to congestion in chest.  Triaged in offline Guidelines - See Provider within 24 hours due to New or worsening cough AND known cardiac or respiratory condition.  Had cardiac cath and stints 2 weeks ago.   Says does not feel like driving to office.   Patient also out of [place] eye gtts for glaucoma. Asking for medication to be called to [facility] before 1 pm since they start the daily delivery at that time. Please review - contact patient at  [phone].

## 2013-06-28 ENCOUNTER — Encounter: Payer: Self-pay | Admitting: Family Medicine

## 2013-06-28 ENCOUNTER — Ambulatory Visit (INDEPENDENT_AMBULATORY_CARE_PROVIDER_SITE_OTHER): Payer: Medicare HMO | Admitting: Family Medicine

## 2013-06-28 VITALS — BP 124/70 | HR 72 | Temp 98.9°F | Wt 203.0 lb

## 2013-06-28 DIAGNOSIS — J209 Acute bronchitis, unspecified: Secondary | ICD-10-CM

## 2013-06-28 MED ORDER — DOXYCYCLINE HYCLATE 100 MG PO TABS: 100.0000 mg | ORAL_TABLET | Freq: Two times a day (BID) | ORAL | Status: DC

## 2013-06-28 MED ORDER — ALBUTEROL SULFATE HFA 108 (90 BASE) MCG/ACT IN AERS: 2.0000 | INHALATION_SPRAY | Freq: Four times a day (QID) | RESPIRATORY_TRACT | Status: DC | PRN

## 2013-06-28 NOTE — Telephone Encounter (Signed)
Patient scheduled with Dr. Para March today at [place]

## 2013-06-28 NOTE — Patient Instructions (Signed)
Drink plenty of fluids in the meantime and try to get some rest.  Start the antibiotics and use the inhaler as needed for cough or wheeze.  This should gradually get better.  Follow up with your regular doctor in a few days if needed.  Take care.

## 2013-06-28 NOTE — Progress Notes (Signed)
Pre-visit discussion using our clinic review tool. No additional management support is needed unless otherwise documented below in the visit note.  She was sick for the last ~5 days.  Started with ST.  Then voice was hoarse.  Chest is sore.  She has not been coughing much.  Chest is congested.  Some sputum today.  No fevers.  No vomiting, occ diarrhea.  Some ear pain, B. No rhinorrhea.  Ears are ringing.  Sneezing frequently.  She has baseline lack of exercise tolerance.  She can still walk through the house now, at/near baseline.  No wheeze noted by patient.  Reports h/o intermittent asthma, h/o distant SABA use.    Meds, vitals, and allergies reviewed.   ROS: See HPI.  Otherwise, noncontributory.  Chronically but not acute ill appearing lady Tm wnl Nasal exam stuffy Op wnl, mmm rrr Diffuse, faint scattered rhonchi with no wheeze.  No focal dec in BS No inc in wob abd soft Ext with trace edema

## 2013-06-29 DIAGNOSIS — J209 Acute bronchitis, unspecified: Secondary | ICD-10-CM | POA: Insufficient documentation

## 2013-06-29 NOTE — Assessment & Plan Note (Signed)
Presumed, given her other medical conditions and the rhonchi, would treat.  She agrees. Start doxy, use SABA prn in meantime and f/u with PCP prn.  Pt agrees.  Supportive tx o/w. Okay for outpatient f/u.

## 2013-07-01 ENCOUNTER — Encounter: Payer: Self-pay | Admitting: Internal Medicine

## 2013-07-01 ENCOUNTER — Ambulatory Visit (INDEPENDENT_AMBULATORY_CARE_PROVIDER_SITE_OTHER): Payer: Medicare HMO | Admitting: Internal Medicine

## 2013-07-01 VITALS — BP 120/50 | HR 67 | Temp 98.3°F | Wt 218.0 lb

## 2013-07-01 DIAGNOSIS — I999 Unspecified disorder of circulatory system: Secondary | ICD-10-CM

## 2013-07-01 DIAGNOSIS — E785 Hyperlipidemia, unspecified: Secondary | ICD-10-CM

## 2013-07-01 DIAGNOSIS — E039 Hypothyroidism, unspecified: Secondary | ICD-10-CM

## 2013-07-01 DIAGNOSIS — I2510 Atherosclerotic heart disease of native coronary artery without angina pectoris: Secondary | ICD-10-CM

## 2013-07-01 DIAGNOSIS — I10 Essential (primary) hypertension: Secondary | ICD-10-CM

## 2013-07-01 DIAGNOSIS — J209 Acute bronchitis, unspecified: Secondary | ICD-10-CM

## 2013-07-01 DIAGNOSIS — E1159 Type 2 diabetes mellitus with other circulatory complications: Secondary | ICD-10-CM

## 2013-07-01 DIAGNOSIS — E1165 Type 2 diabetes mellitus with hyperglycemia: Secondary | ICD-10-CM

## 2013-07-01 DIAGNOSIS — IMO0001 Reserved for inherently not codable concepts without codable children: Secondary | ICD-10-CM

## 2013-07-01 DIAGNOSIS — IMO0002 Reserved for concepts with insufficient information to code with codable children: Secondary | ICD-10-CM

## 2013-07-01 DIAGNOSIS — I798 Other disorders of arteries, arterioles and capillaries in diseases classified elsewhere: Secondary | ICD-10-CM

## 2013-07-01 LAB — COMPREHENSIVE METABOLIC PANEL
ALBUMIN: 3.6 g/dL (ref 3.5–5.2)
ALK PHOS: 74 U/L (ref 39–117)
ALT: 10 U/L (ref 0–35)
AST: 14 U/L (ref 0–37)
BUN: 24 mg/dL — ABNORMAL HIGH (ref 6–23)
CO2: 24 mEq/L (ref 19–32)
Calcium: 9.4 mg/dL (ref 8.4–10.5)
Chloride: 108 mEq/L (ref 96–112)
Creatinine, Ser: 0.9 mg/dL (ref 0.4–1.2)
GFR: 65.51 mL/min (ref >60.00)
Glucose, Bld: 228 mg/dL — ABNORMAL HIGH (ref 70–99)
POTASSIUM: 4.8 meq/L (ref 3.5–5.1)
SODIUM: 140 meq/L (ref 135–145)
TOTAL PROTEIN: 7.5 g/dL (ref 6.0–8.3)
Total Bilirubin: 0.9 mg/dL (ref 0.3–1.2)

## 2013-07-01 LAB — HM MAMMOGRAPHY

## 2013-07-01 LAB — HEMOGLOBIN A1C: Hgb A1c MFr Bld: 7.8 % — ABNORMAL HIGH (ref 4.6–6.5)

## 2013-07-01 LAB — TSH: TSH: 0.59 u[IU]/mL (ref 0.35–5.50)

## 2013-07-01 LAB — LIPID PANEL
CHOL/HDL RATIO: 3
Cholesterol: 134 mg/dL (ref 0–200)
HDL: 40.6 mg/dL (ref >39.00)
LDL CALC: 59 mg/dL (ref 0–99)
Triglycerides: 170 mg/dL — ABNORMAL HIGH (ref 0.0–149.0)
VLDL: 34 mg/dL (ref 0.0–40.0)

## 2013-07-01 LAB — MICROALBUMIN / CREATININE URINE RATIO
CREATININE, U: 67.4 mg/dL
MICROALB UR: 9.3 mg/dL — AB (ref 0.0–1.9)
MICROALB/CREAT RATIO: 13.8 mg/g (ref 0.0–30.0)

## 2013-07-01 MED ORDER — SIMVASTATIN 40 MG PO TABS: 40.0000 mg | ORAL_TABLET | Freq: Every day | ORAL | Status: DC

## 2013-07-01 MED ORDER — METOPROLOL TARTRATE 25 MG PO TABS: 25.0000 mg | ORAL_TABLET | Freq: Two times a day (BID) | ORAL | Status: DC | PRN

## 2013-07-01 MED ORDER — FUROSEMIDE 40 MG PO TABS
40.0000 mg | ORAL_TABLET | Freq: Two times a day (BID) | ORAL | Status: DC
Start: 2013-07-01 — End: 2014-06-26

## 2013-07-01 MED ORDER — LISINOPRIL 10 MG PO TABS: 10.0000 mg | ORAL_TABLET | Freq: Every day | ORAL | Status: DC

## 2013-07-01 MED ORDER — INSULIN GLARGINE 100 UNIT/ML ~~LOC~~ SOLN: 50.0000 [IU] | Freq: Every day | SUBCUTANEOUS | Status: DC

## 2013-07-01 MED ORDER — LEVOTHYROXINE SODIUM 150 MCG PO TABS: 150.0000 ug | ORAL_TABLET | Freq: Every day | ORAL | Status: DC

## 2013-07-01 MED ORDER — METFORMIN HCL 500 MG PO TABS: 500.0000 mg | ORAL_TABLET | Freq: Two times a day (BID) | ORAL | Status: DC

## 2013-07-01 MED ORDER — AMLODIPINE BESYLATE 10 MG PO TABS: 10.0000 mg | ORAL_TABLET | Freq: Every day | ORAL | Status: DC

## 2013-07-01 NOTE — Assessment & Plan Note (Signed)
May need to consider change to Atorvastatin given use of Amlodipine, however will confirm with her no problems with this medication in the past. Will check LFTs with labs today.Marland Kitchen

## 2013-07-01 NOTE — Assessment & Plan Note (Signed)
Symptomatically doing well. Continue statin, aspirin, ACEi, BB. Encouraged better control of diet and blood sugars. Will set up follow up with her cardiologist.

## 2013-07-01 NOTE — Progress Notes (Signed)
Pre-visit discussion using our clinic review tool. No additional management support is needed unless otherwise documented below in the visit note.  

## 2013-07-01 NOTE — Assessment & Plan Note (Signed)
BP Readings from Last 3 Encounters:  07/01/13 120/50  06/28/13 124/70  06/21/13 118/78   BP well controlled. Will check renal function with labs. Continue lisinopril, metoprolol, amlodipine.

## 2013-07-01 NOTE — Assessment & Plan Note (Signed)
Symptoms improving on Doxycycline. Will continue. Plan follow up in 1-2 weeks. If symptoms persistent, discussed checking CXR.

## 2013-07-01 NOTE — Progress Notes (Signed)
Subjective:    Patient ID: [name] [name], female    DOB: 09/17/1934, 79 y.o.   MRN: [id number]  HPI 79YO female with CAD, DM, HTN presents for hospital follow up.  Recently treated for acute bronchitis with Doxycyline. Started medication 2/10. Continues to have some persistent cough, however improving. Sputum has been clearing. No fever, chills. Shortness of breath unchanged from baseline with exercise intolerance, but no dyspnea at rest.  Hospitalized in January twice, once for elevated BP and then for chest pain. Had cardiac cath which reportedly showed 60% in-stent stenosis. Decision made to treat medically. No recent chest pain. Compliant with medications. Has not yet followed up with cardiology.  DM - Taking Lantus 50units daily and metformin. Occasional BG over 200, most near 150  Review of Systems  Constitutional: Positive for fatigue. Negative for fever, chills, appetite change and unexpected weight change.  HENT: Negative for congestion, ear pain, sinus pressure, sore throat, trouble swallowing and voice change.   Eyes: Negative for visual disturbance.  Respiratory: Positive for cough and shortness of breath. Negative for wheezing and stridor.   Cardiovascular: Negative for chest pain, palpitations and leg swelling.  Gastrointestinal: Negative for nausea, vomiting, abdominal pain, diarrhea, constipation, blood in stool, abdominal distention and anal bleeding.  Genitourinary: Negative for dysuria and flank pain.  Musculoskeletal: Negative for arthralgias, gait problem, myalgias and neck pain.  Skin: Negative for color change and rash.  Neurological: Negative for dizziness and headaches.  Hematological: Negative for adenopathy. Does not bruise/bleed easily.  Psychiatric/Behavioral: Negative for suicidal ideas, sleep disturbance and dysphoric mood. The patient is not nervous/anxious.        Objective:    BP 120/50  Pulse 67  Temp(Src) 98.3 F (36.8 C) (Oral)  Wt 218 lb  (98.884 kg)  SpO2 97% Physical Exam  Constitutional: She is oriented to person, place, and time. She appears well-developed and well-nourished. No distress.  HENT:  Head: Normocephalic and atraumatic.  Right Ear: External ear normal.  Left Ear: External ear normal.  Nose: Nose normal.  Mouth/Throat: Oropharynx is clear and moist. No oropharyngeal exudate.  Eyes: Conjunctivae are normal. Pupils are equal, round, and reactive to light. Right eye exhibits no discharge. Left eye exhibits no discharge. No scleral icterus.  Neck: Normal range of motion. Neck supple. No tracheal deviation present. No thyromegaly present.  Cardiovascular: Normal rate, regular rhythm, normal heart sounds and intact distal pulses.  Exam reveals no gallop and no friction rub.   No murmur heard. Pulmonary/Chest: Effort normal. No accessory muscle usage. Not tachypneic. No respiratory distress. She has no decreased breath sounds. She has no wheezes. She has rhonchi (few scattered). She has no rales. She exhibits no tenderness.  Musculoskeletal: Normal range of motion. She exhibits no edema and no tenderness.  Lymphadenopathy:    She has no cervical adenopathy.  Neurological: She is alert and oriented to person, place, and time. No cranial nerve deficit. She exhibits normal muscle tone. Coordination normal.  Skin: Skin is warm and dry. No rash noted. She is not diaphoretic. No erythema. No pallor.  Psychiatric: She has a normal mood and affect. Her behavior is normal. Judgment and thought content normal.          Assessment & Plan:   Problem List Items Addressed This Visit   Acute bronchitis     Symptoms improving on Doxycycline. Will continue. Plan follow up in 1-2 weeks. If symptoms persistent, discussed checking CXR.    CAD (coronary artery disease)  Symptomatically doing well. Continue statin, aspirin, ACEi, BB. Encouraged better control of diet and blood sugars. Will set up follow up with her  cardiologist.    Relevant Medications      simvastatin (ZOCOR) tablet      lisinopril (PRINIVIL,ZESTRIL) tablet      metoprolol tartrate (LOPRESSOR) tablet      amLODIpine (NORVASC) tablet      furosemide (LASIX) tablet   Hyperlipidemia     May need to consider change to Atorvastatin given use of Amlodipine, however will confirm with her no problems with this medication in the past. Will check LFTs with labs today..    Hypertension      BP Readings from Last 3 Encounters:  07/01/13 120/50  06/28/13 124/70  06/21/13 118/78   BP well controlled. Will check renal function with labs. Continue lisinopril, metoprolol, amlodipine.    Hypothyroidism     Will check TSH with labs today.    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 Continue Lantus, Metformin. Follow up 2-4 weeks.    Relevant Medications      metFORMIN (GLUCOPHAGE) tablet      insulin glargine (LANTUS) 100 UNIT/ML injection    Other Visit Diagnoses   Diabetes mellitus type 2, uncontrolled    -  Primary    Relevant Orders       Comprehensive metabolic panel (Completed)       Hemoglobin A1c (Completed)       Microalbumin / creatinine urine ratio (Completed)    Other and unspecified hyperlipidemia        Relevant Orders       Lipid panel (Completed)        Return in about 2 weeks (around 07/15/2013).

## 2013-07-01 NOTE — Assessment & Plan Note (Signed)
Will check TSH with labs today. 

## 2013-07-01 NOTE — Assessment & Plan Note (Signed)
Will check A1c with labs today. Continue Lantus, Metformin. Follow up 2-4 weeks.

## 2013-07-02 ENCOUNTER — Telehealth: Payer: Self-pay | Admitting: Internal Medicine

## 2013-07-02 ENCOUNTER — Ambulatory Visit: Payer: Commercial Managed Care - HMO

## 2013-07-02 ENCOUNTER — Telehealth: Payer: Self-pay

## 2013-07-02 DIAGNOSIS — E039 Hypothyroidism, unspecified: Secondary | ICD-10-CM

## 2013-07-02 DIAGNOSIS — E785 Hyperlipidemia, unspecified: Secondary | ICD-10-CM

## 2013-07-02 LAB — T4, FREE: Free T4: 1.6 ng/dL (ref 0.60–1.60)

## 2013-07-02 MED ORDER — ATORVASTATIN CALCIUM 20 MG PO TABS: 20.0000 mg | ORAL_TABLET | Freq: Every day | ORAL | Status: DC

## 2013-07-02 NOTE — Telephone Encounter (Signed)
Relevant patient education mailed to patient.  

## 2013-07-02 NOTE — Telephone Encounter (Signed)
Message copied by Shelia Media on Fri Jul 02, 2013  5:02 PM ------      Message from: Theola Sequin      Created: Fri Jul 02, 2013  1:46 PM      Regarding: RE: Statin       Spoke with patient, she is ok with that. Would like this called in to [facility] pharmacy      ----- Message -----         From: Shelia Media, MD         Sent: 07/01/2013   5:14 PM           To: Theola Sequin, CMA      Subject: Statin                                                   I would like to change her statin from Simvastatin to Atorvastatin. Can you ask her if she has ever taken Atorvastatin?      Thanks       ------

## 2013-07-02 NOTE — Telephone Encounter (Signed)
One additional test of thyroid function was slightly high. I would like to repeat a TSH and Free T4 in 4 weeks.

## 2013-07-12 ENCOUNTER — Ambulatory Visit: Payer: Medicare HMO | Admitting: Cardiovascular Disease

## 2013-07-13 LAB — HM DIABETES EYE EXAM

## 2013-07-19 ENCOUNTER — Ambulatory Visit: Payer: Medicare HMO | Admitting: Internal Medicine

## 2013-07-19 ENCOUNTER — Telehealth: Payer: Self-pay | Admitting: Internal Medicine

## 2013-07-19 NOTE — Telephone Encounter (Signed)
Patient confirmed appointment for 3/10 at 10:00

## 2013-07-19 NOTE — Telephone Encounter (Signed)
Next available . May look at Tuesday 3/10 as this schedule could be opened up.

## 2013-07-19 NOTE — Telephone Encounter (Signed)
Pt called to cancel appt today due to still having ice on her car.  States she is afraid to drive.  Advised that next available (30 min) appt is 3/24 at 4:30 p.m.  Pt states she cannot come that late in the day, or wait that long for the appt.  She is also concerned that she is catching a cold.  Asked to send Dr. Dan Humphreys a msg letting her know and to see if she could be seen sooner, but not today.  States she does not have anyone who can bring her in and she is afraid to drive.

## 2013-07-19 NOTE — Telephone Encounter (Signed)
Fwd to Dr. Dan Humphreys, please advise for available openings for patient to be seen sooner.

## 2013-07-22 ENCOUNTER — Ambulatory Visit: Payer: Medicare HMO | Admitting: Cardiovascular Disease

## 2013-07-27 ENCOUNTER — Ambulatory Visit: Payer: Medicare HMO | Admitting: Internal Medicine

## 2013-08-03 ENCOUNTER — Encounter: Payer: Self-pay | Admitting: Cardiovascular Disease

## 2013-08-03 ENCOUNTER — Ambulatory Visit (INDEPENDENT_AMBULATORY_CARE_PROVIDER_SITE_OTHER): Payer: Commercial Managed Care - HMO | Admitting: Cardiovascular Disease

## 2013-08-03 VITALS — BP 140/62 | HR 62 | Ht 63.0 in | Wt 223.0 lb

## 2013-08-03 DIAGNOSIS — E785 Hyperlipidemia, unspecified: Secondary | ICD-10-CM

## 2013-08-03 DIAGNOSIS — G8929 Other chronic pain: Secondary | ICD-10-CM

## 2013-08-03 DIAGNOSIS — M549 Dorsalgia, unspecified: Secondary | ICD-10-CM

## 2013-08-03 DIAGNOSIS — I2510 Atherosclerotic heart disease of native coronary artery without angina pectoris: Secondary | ICD-10-CM

## 2013-08-03 DIAGNOSIS — E1159 Type 2 diabetes mellitus with other circulatory complications: Secondary | ICD-10-CM

## 2013-08-03 DIAGNOSIS — I798 Other disorders of arteries, arterioles and capillaries in diseases classified elsewhere: Secondary | ICD-10-CM

## 2013-08-03 DIAGNOSIS — I4892 Unspecified atrial flutter: Secondary | ICD-10-CM

## 2013-08-03 DIAGNOSIS — I10 Essential (primary) hypertension: Secondary | ICD-10-CM

## 2013-08-03 DIAGNOSIS — E1165 Type 2 diabetes mellitus with hyperglycemia: Secondary | ICD-10-CM

## 2013-08-03 DIAGNOSIS — I999 Unspecified disorder of circulatory system: Secondary | ICD-10-CM

## 2013-08-03 NOTE — Assessment & Plan Note (Signed)
Weight is a major issue. She is unable to exercise secondary to severe back pain on today's visit. We have encouraged continued careful diet management in an effort to lose weight.

## 2013-08-03 NOTE — Assessment & Plan Note (Signed)
Blood pressure is well controlled on today's visit. No changes made to the medications. 

## 2013-08-03 NOTE — Assessment & Plan Note (Signed)
Worsening back pain over the past 3 weeks after she injured it. Recommended she discuss this with Dr. walker. Perhaps she could try naproxen if she has insomnia with ibuprofen. May need pain medication, referral back to orthopedics.

## 2013-08-03 NOTE — Assessment & Plan Note (Signed)
Medical management was recommended after recent catheterization. She does have moderate RCA and ramus vessel disease, significant OM branch disease but this vessel is small. Intervention on the ramus was offered if she has worsening angina. Today symptoms are of general malaise, weakness from her back and leg pain. We'll continue aggressive medical management

## 2013-08-03 NOTE — Assessment & Plan Note (Signed)
Cholesterol is at goal on the current lipid regimen. No changes to the medications were made.  

## 2013-08-03 NOTE — Patient Instructions (Signed)
You are doing well. No medication changes were made.  Please call us if you have new issues that need to be addressed before your next appt.  Your physician wants you to follow-up in: 6 months.  You will receive a reminder letter in the mail two months in advance. If you don't receive a letter, please call our office to schedule the follow-up appointment.   

## 2013-08-03 NOTE — Progress Notes (Signed)
Patient ID: [name] [name], female    DOB: 10-09-1934, 79 y.o.   MRN: [id number]  HPI Comments: [name] [name] is a 79 year old woman with a history of CAD, status post PTCA/cutting balloon angioplasty for in-stent restenosis of the RCA,  Who presents for routine followup. history of total right knee replacement,  also history of spinal stenosis.  She was recently admitted to the hospital at the end of May 17 2013 with discharge 05/18/2013 with diagnosis of chest pain.  Cardiac enzymes were negative. She had stress test that showed no ischemia.   Repeat hospital admission 06/12/2011 with discharge 06/12/2013. Admitted with chest pain. She had cardiac catheterization that showed 60% mid RCA disease, FFR performed with the results of 0.86, no intervention done. Also with 75% ramus disease, 90% small OM disease. Medical management was recommended  In followup today she has significant stressors at home. She reports that her landlord placed a water pump on her electric meter and her electrical is very high. She is arguing about this with her landlady. Also has severe right low back pain radiating down her leg for the past 3 weeks. Was unable to see orthopedics at regularly scheduled appointment earlier this year secondary to weather. He does not have her pain medication. Ibuprofen for the past 2 nights has kept her awake all night. She has mild chronic shortness of breath. She denies any chest pain, anginal-type symptoms, no lightheadedness or dizziness since she has left the hospital. She is having significant financial hardships. She walks with a cane.  Prior lab work; Total cholesterol 170, LDL 96.    episode of chest pain in 2012, admitted to the hospital, workup including cardiac enzymes and a stress test. Stress test showed no ischemia and she was discharged home She has significant orthopedic problems. She had a fall in the recent past, broke her left shoulder. Scheduled for surgery on  September 18.  Previous Hemoglobin A1c 8.3. After cortisone shot   EKG shows normal sinus rhythm ,  rate of 62 and beats per minute , ST and T wave abnormality in lead one and aVL       Outpatient Encounter Prescriptions as of 08/03/2013  Medication Sig  . albuterol (PROVENTIL HFA;VENTOLIN HFA) 108 (90 BASE) MCG/ACT inhaler Inhale 2 puffs into the lungs every 6 (six) hours as needed for wheezing or shortness of breath.  Marland Kitchen amLODipine (NORVASC) 10 MG tablet Take 1 tablet (10 mg total) by mouth daily.  Marland Kitchen aspirin 81 MG tablet Take by mouth daily.   Marland Kitchen atorvastatin (LIPITOR) 20 MG tablet Take 1 tablet (20 mg total) by mouth daily.  . cyclobenzaprine (FLEXERIL) 5 MG tablet Take 1 tablet (5 mg total) by mouth 3 (three) times daily as needed.  . doxycycline (VIBRA-TABS) 100 MG tablet Take 1 tablet (100 mg total) by mouth 2 (two) times daily.  . ferrous sulfate 325 (65 FE) MG tablet Take 325 mg by mouth daily with breakfast.  . fluticasone (FLONASE) 50 MCG/ACT nasal spray Place 2 sprays into the nose daily.  . furosemide (LASIX) 40 MG tablet Take 1 tablet (40 mg total) by mouth 2 (two) times daily.  Marland Kitchen gabapentin (NEURONTIN) 100 MG capsule Take 1 capsule (100 mg total) by mouth 3 (three) times daily.  Marland Kitchen HYDROcodone-acetaminophen (NORCO/VICODIN) 5-325 MG per tablet Take 1 tablet by mouth every 6 (six) hours as needed.  . insulin glargine (LANTUS) 100 UNIT/ML injection Inject 0.5 mLs (50 Units total) into the skin at bedtime.  Marland Kitchen  insulin regular (NOVOLIN R RELION) 100 units/mL injection INJECT  SUBCUTANEOUSLY 4  UNITS  IF  BLOOD  SUGAR  IS  GREATER  THAN  250  . isosorbide mononitrate (ISMO,MONOKET) 10 MG tablet Take 1 tablet (10 mg total) by mouth 2 (two) times daily.  Marland Kitchen latanoprost (XALATAN) 0.005 % ophthalmic solution Place 1 drop into both eyes at bedtime.   Marland Kitchen levothyroxine (LEVOTHROID) 150 MCG tablet Take 1 tablet (150 mcg total) by mouth daily.  Marland Kitchen lisinopril (PRINIVIL,ZESTRIL) 10 MG tablet Take 1  tablet (10 mg total) by mouth daily.  . meclizine (ANTIVERT) 25 MG tablet Take 1 tablet (25 mg total) by mouth daily. As needed for dizziness or nausea  . metFORMIN (GLUCOPHAGE) 500 MG tablet Take 1 tablet (500 mg total) by mouth 2 (two) times daily.  . metoprolol tartrate (LOPRESSOR) 25 MG tablet Take 1 tablet (25 mg total) by mouth 2 (two) times daily as needed.  . nitroGLYCERIN (NITROSTAT) 0.4 MG SL tablet Place 1 tablet (0.4 mg total) under the tongue every 5 (five) minutes as needed.  . nystatin cream (MYCOSTATIN) Apply topically 2 (two) times daily.  Marland Kitchen nystatin-triamcinolone (MYCOLOG II) cream Apply 1 application topically 2 (two) times daily as needed.  Marland Kitchen omeprazole (PRILOSEC) 20 MG capsule Take 1 capsule (20 mg total) by mouth 2 (two) times daily.  . potassium chloride SA (K-DUR,KLOR-CON) 20 MEQ tablet Take 1 tablet (20 mEq total) by mouth 2 (two) times daily.  . traMADol (ULTRAM) 50 MG tablet Take 1 tablet (50 mg total) by mouth every 8 (eight) hours as needed for pain.    Review of Systems  HENT: Negative.   Eyes: Negative.   Respiratory: Positive for shortness of breath.   Cardiovascular: Negative.   Gastrointestinal: Negative.   Endocrine: Negative.   Musculoskeletal: Positive for arthralgias, back pain, gait problem and joint swelling.  Skin: Negative.   Allergic/Immunologic: Negative.   Neurological: Positive for weakness.  Hematological: Negative.   Psychiatric/Behavioral: Negative.   All other systems reviewed and are negative.    BP 140/62  Pulse 62  Ht 5\' 3"  (1.6 m)  Wt 223 lb (101.152 kg)  BMI 39.51 kg/m2  Physical Exam  Nursing note and vitals reviewed. Constitutional: She is oriented to person, place, and time. She appears well-developed and well-nourished.  Obese  HENT:  Head: Normocephalic.  Nose: Nose normal.  Mouth/Throat: Oropharynx is clear and moist.  Eyes: Conjunctivae are normal. Pupils are equal, round, and reactive to light.  Neck: Normal  range of motion. Neck supple. No JVD present.  Cardiovascular: Normal rate, regular rhythm, S1 normal, S2 normal, normal heart sounds and intact distal pulses.  Exam reveals no gallop and no friction rub.   No murmur heard. Pulmonary/Chest: Effort normal and breath sounds normal. No respiratory distress. She has no wheezes. She has no rales. She exhibits no tenderness.  Abdominal: Soft. Bowel sounds are normal. She exhibits no distension. There is no tenderness.  Musculoskeletal: Normal range of motion. She exhibits no edema and no tenderness.  Lymphadenopathy:    She has no cervical adenopathy.  Neurological: She is alert and oriented to person, place, and time. Coordination normal.  Skin: Skin is warm and dry. No rash noted. No erythema.  Psychiatric: She has a normal mood and affect. Her behavior is normal. Judgment and thought content normal.    Assessment and Plan

## 2013-08-04 ENCOUNTER — Ambulatory Visit (INDEPENDENT_AMBULATORY_CARE_PROVIDER_SITE_OTHER): Payer: Medicare HMO | Admitting: Internal Medicine

## 2013-08-04 ENCOUNTER — Encounter: Payer: Self-pay | Admitting: Internal Medicine

## 2013-08-04 VITALS — BP 140/78 | HR 67 | Temp 97.9°F

## 2013-08-04 DIAGNOSIS — E785 Hyperlipidemia, unspecified: Secondary | ICD-10-CM

## 2013-08-04 DIAGNOSIS — I798 Other disorders of arteries, arterioles and capillaries in diseases classified elsewhere: Secondary | ICD-10-CM

## 2013-08-04 DIAGNOSIS — M549 Dorsalgia, unspecified: Secondary | ICD-10-CM

## 2013-08-04 DIAGNOSIS — I2510 Atherosclerotic heart disease of native coronary artery without angina pectoris: Secondary | ICD-10-CM

## 2013-08-04 DIAGNOSIS — E1165 Type 2 diabetes mellitus with hyperglycemia: Secondary | ICD-10-CM

## 2013-08-04 DIAGNOSIS — G629 Polyneuropathy, unspecified: Secondary | ICD-10-CM

## 2013-08-04 DIAGNOSIS — E1159 Type 2 diabetes mellitus with other circulatory complications: Secondary | ICD-10-CM

## 2013-08-04 DIAGNOSIS — I10 Essential (primary) hypertension: Secondary | ICD-10-CM

## 2013-08-04 DIAGNOSIS — M25559 Pain in unspecified hip: Secondary | ICD-10-CM

## 2013-08-04 DIAGNOSIS — G589 Mononeuropathy, unspecified: Secondary | ICD-10-CM

## 2013-08-04 DIAGNOSIS — M25551 Pain in right hip: Secondary | ICD-10-CM

## 2013-08-04 DIAGNOSIS — G8929 Other chronic pain: Secondary | ICD-10-CM

## 2013-08-04 DIAGNOSIS — I999 Unspecified disorder of circulatory system: Secondary | ICD-10-CM

## 2013-08-04 LAB — COMPREHENSIVE METABOLIC PANEL
ALK PHOS: 74 U/L (ref 39–117)
ALT: 14 U/L (ref 0–35)
AST: 16 U/L (ref 0–37)
Albumin: 3.9 g/dL (ref 3.5–5.2)
BILIRUBIN TOTAL: 0.5 mg/dL (ref 0.3–1.2)
BUN: 25 mg/dL — ABNORMAL HIGH (ref 6–23)
CO2: 24 mEq/L (ref 19–32)
CREATININE: 0.9 mg/dL (ref 0.4–1.2)
Calcium: 9.2 mg/dL (ref 8.4–10.5)
Chloride: 105 mEq/L (ref 96–112)
GFR: 63.83 mL/min (ref >60.00)
Glucose, Bld: 255 mg/dL — ABNORMAL HIGH (ref 70–99)
Potassium: 4.1 mEq/L (ref 3.5–5.1)
SODIUM: 138 meq/L (ref 135–145)
TOTAL PROTEIN: 7.4 g/dL (ref 6.0–8.3)

## 2013-08-04 LAB — LIPID PANEL
Cholesterol: 151 mg/dL (ref 0–200)
HDL: 48.2 mg/dL (ref >39.00)
LDL CALC: 78 mg/dL (ref 0–99)
Total CHOL/HDL Ratio: 3
Triglycerides: 126 mg/dL (ref 0.0–149.0)
VLDL: 25.2 mg/dL (ref 0.0–40.0)

## 2013-08-04 MED ORDER — GABAPENTIN 300 MG PO CAPS: 300.0000 mg | ORAL_CAPSULE | Freq: Three times a day (TID) | ORAL | Status: DC

## 2013-08-04 NOTE — Patient Instructions (Signed)
Increase Neurontin to 300mg  three times daily.  We will order xray of right hip for further evaluation.

## 2013-08-04 NOTE — Assessment & Plan Note (Signed)
Encouraged weight loss, compliance with medications.

## 2013-08-04 NOTE — Assessment & Plan Note (Signed)
Chronic pain in neck, back. Referral placed to orthopedics at last visit, however pt did not keep scheduled follow up with them.Symptoms most likely secondary to OA. Encouraged her to try Naprosyn as directed by Dr. Mariah Milling. Will increase Neurontin to 300mg  tid in attempt to improve pain control. Encouraged her to keep follow up with ortho.

## 2013-08-04 NOTE — Progress Notes (Signed)
Pre visit review using our clinic review tool, if applicable. No additional management support is needed unless otherwise documented below in the visit note. 

## 2013-08-04 NOTE — Assessment & Plan Note (Signed)
Pt complains of severe right hip pain over last several weeks. Exam is remarkable for tenderness even to light palpation over entire hip and groin. Question if she may have fracture or OA leading to symptoms. Encouraged her to try changing to Naproxyn for pain as suggested by Dr. Mariah Milling yesterday. Encouraged her to follow up as scheduled with orthopedics. Encouraged her repeatedly to get xray of her hip, however she is still trying to decide if she will proceed with this.

## 2013-08-04 NOTE — Assessment & Plan Note (Signed)
BP Readings from Last 3 Encounters:  08/04/13 140/78  08/03/13 140/62  07/01/13 120/50   BP generally well controlled. Continue current medications.

## 2013-08-04 NOTE — Assessment & Plan Note (Signed)
No recent chest pain. Will continue medical management.

## 2013-08-04 NOTE — Progress Notes (Signed)
Subjective:    Patient ID: [name] [name], female    DOB: 03-10-1935, 79 y.o.   MRN: [id number]  HPI 79YO female with DM, HTN, CAD and chronic pain presents for follow up.  Chronic pain - Has worsening pain in right hip over last 3 weeks. No recent injury. Unable to lift/move right leg without some  Able to ambulate with a cane. Tried taking ibuprofen but unable to sleep. Plans to try aleve. In the past used Tramadol with no improvement. Has not followed up with ortho because of finances.  Burning in left foot over last few days. Taking Gabapentin with no improvement. Slightly improved today.  CAD - s/p recent admission for chest pain. Cardiac cath showed RCA and ramus disease. Plan for medical management. No recent chest pain.  HL - started taking Atorvastatin.  DM - Does not monitor BG. Complaint with medications.  Review of Systems  Constitutional: Negative for fever, chills, appetite change, fatigue and unexpected weight change.  HENT: Negative for congestion, ear pain, sinus pressure, sore throat, trouble swallowing and voice change.   Eyes: Negative for visual disturbance.  Respiratory: Negative for cough, shortness of breath, wheezing and stridor.   Cardiovascular: Negative for chest pain, palpitations and leg swelling.  Gastrointestinal: Negative for nausea, vomiting, abdominal pain, diarrhea, constipation, blood in stool, abdominal distention and anal bleeding.  Genitourinary: Negative for dysuria and flank pain.  Musculoskeletal: Positive for arthralgias, back pain, gait problem and myalgias. Negative for neck pain.  Skin: Negative for color change and rash.  Neurological: Negative for dizziness and headaches.  Hematological: Negative for adenopathy. Does not bruise/bleed easily.  Psychiatric/Behavioral: Negative for suicidal ideas, sleep disturbance and dysphoric mood. The patient is not nervous/anxious.        Objective:    BP 140/78  Pulse 67  Temp(Src) 97.9 F  (36.6 C) (Oral)  SpO2 96% Physical Exam  Constitutional: She is oriented to person, place, and time. She appears well-developed and well-nourished. No distress.  HENT:  Head: Normocephalic and atraumatic.  Right Ear: External ear normal.  Left Ear: External ear normal.  Nose: Nose normal.  Mouth/Throat: Oropharynx is clear and moist. No oropharyngeal exudate.  Eyes: Conjunctivae are normal. Pupils are equal, round, and reactive to light. Right eye exhibits no discharge. Left eye exhibits no discharge. No scleral icterus.  Neck: Normal range of motion. Neck supple. No tracheal deviation present. No thyromegaly present.  Cardiovascular: Normal rate, regular rhythm, normal heart sounds and intact distal pulses.  Exam reveals no gallop and no friction rub.   No murmur heard. Pulmonary/Chest: Effort normal and breath sounds normal. No accessory muscle usage. Not tachypneic. No respiratory distress. She has no decreased breath sounds. She has no wheezes. She has no rhonchi. She has no rales. She exhibits no tenderness.  Musculoskeletal: She exhibits no edema.       Right hip: She exhibits decreased range of motion, tenderness (diffuse anterior and lateral hip) and bony tenderness. She exhibits normal strength, no swelling and no deformity.       Left ankle: She exhibits normal range of motion and no swelling. No tenderness.  Lymphadenopathy:    She has no cervical adenopathy.  Neurological: She is alert and oriented to person, place, and time. No cranial nerve deficit. She exhibits normal muscle tone. Coordination normal.  Skin: Skin is warm and dry. No rash noted. She is not diaphoretic. No erythema. No pallor.  Psychiatric: She has a normal mood and affect. Her behavior  is normal. Judgment and thought content normal.          Assessment & Plan:   Problem List Items Addressed This Visit   CAD (coronary artery disease)     No recent chest pain. Will continue medical management.     Chronic back pain     Chronic pain in neck, back. Referral placed to orthopedics at last visit, however pt did not keep scheduled follow up with them.Symptoms most likely secondary to OA. Encouraged her to try Naprosyn as directed by Dr. Mariah Milling. Will increase Neurontin to 300mg  tid in attempt to improve pain control. Encouraged her to keep follow up with ortho.    Hyperlipidemia   Relevant Orders      Comprehensive metabolic panel (Completed)      Lipid panel (Completed)   Hypertension      BP Readings from Last 3 Encounters:  08/04/13 140/78  08/03/13 140/62  07/01/13 120/50   BP generally well controlled. Continue current medications.    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 Exam is remarkable for tenderness even to light palpation over entire hip and groin. Question if she may have fracture or OA leading to symptoms. Encouraged her to try changing to Naproxyn for pain as suggested by Dr. 08/29/13 yesterday. Encouraged her to follow up as scheduled with orthopedics. Encouraged her repeatedly to get xray of her hip, however she is still trying to decide if she will proceed with this.    Relevant Orders      DG Hip Complete Right    Other Visit Diagnoses   Neuropathy        Relevant Medications       gabapentin (NEURONTIN) capsule        Return in about 4 weeks (around 09/01/2013).

## 2013-08-05 ENCOUNTER — Telehealth: Payer: Self-pay | Admitting: Internal Medicine

## 2013-08-05 ENCOUNTER — Ambulatory Visit (INDEPENDENT_AMBULATORY_CARE_PROVIDER_SITE_OTHER)
Admission: RE | Admit: 2013-08-05 | Discharge: 2013-08-05 | Disposition: A | Discharge status: Home / Self Care | Payer: Medicare HMO | Source: Ambulatory Visit | Attending: Internal Medicine | Admitting: Internal Medicine

## 2013-08-05 ENCOUNTER — Encounter: Payer: Self-pay | Admitting: *Deleted

## 2013-08-05 DIAGNOSIS — G8929 Other chronic pain: Secondary | ICD-10-CM

## 2013-08-05 DIAGNOSIS — M25559 Pain in unspecified hip: Secondary | ICD-10-CM

## 2013-08-05 DIAGNOSIS — M549 Dorsalgia, unspecified: Principal | ICD-10-CM

## 2013-08-05 DIAGNOSIS — M25551 Pain in right hip: Secondary | ICD-10-CM

## 2013-08-05 NOTE — Telephone Encounter (Signed)
The patient called wanting xray results and something for pain.

## 2013-08-06 MED ORDER — CYCLOBENZAPRINE HCL 5 MG PO TABS: 5.0000 mg | ORAL_TABLET | Freq: Three times a day (TID) | ORAL | Status: DC | PRN

## 2013-08-06 NOTE — Telephone Encounter (Signed)
I would suggest that she try using the Naproxen (Aleve) which is over the counter, as suggested by Dr. Mariah Milling

## 2013-08-06 NOTE — Telephone Encounter (Signed)
Has she tried her flexeril or tramadol?

## 2013-08-06 NOTE — Telephone Encounter (Signed)
Called and spoke with patient yesterday to inform her of X-ray results. Patient would like to know what is she to do about the pain she is still having. Informed patient Dr. Dan Humphreys would like her to be seen by orthopedist. She is fine with doing that however would like something until she is seen because the OTC medications are not working.

## 2013-08-06 NOTE — Telephone Encounter (Signed)
Patient informed and verbally agreed understanding. She has not tried that in the past, she will pick it up on Monday.

## 2013-08-06 NOTE — Telephone Encounter (Signed)
Does not have Flexeril or Tramadol at home, is out of Rxs.

## 2013-08-06 NOTE — Telephone Encounter (Signed)
Spoke with pt, states she has taken Aleve with no reflief, took 2 this morning and has not improved her pain. Also, does she need to call her orthopedic doctor to make appt or will we refer? Sees Dr. Martha Clan

## 2013-08-06 NOTE — Telephone Encounter (Signed)
OK. We can call in Flexeril for her to try to see if any improvement in pain. I will place order. Please let her know that this medication can cause drowsiness.

## 2013-09-03 ENCOUNTER — Encounter: Payer: Self-pay | Admitting: Internal Medicine

## 2013-09-03 ENCOUNTER — Ambulatory Visit (INDEPENDENT_AMBULATORY_CARE_PROVIDER_SITE_OTHER): Payer: Commercial Managed Care - HMO | Admitting: Internal Medicine

## 2013-09-03 VITALS — BP 110/60 | HR 65 | Resp 16 | Wt 228.5 lb

## 2013-09-03 DIAGNOSIS — M25551 Pain in right hip: Secondary | ICD-10-CM

## 2013-09-03 DIAGNOSIS — E1159 Type 2 diabetes mellitus with other circulatory complications: Secondary | ICD-10-CM

## 2013-09-03 DIAGNOSIS — M25559 Pain in unspecified hip: Secondary | ICD-10-CM

## 2013-09-03 DIAGNOSIS — E119 Type 2 diabetes mellitus without complications: Secondary | ICD-10-CM

## 2013-09-03 DIAGNOSIS — Z0100 Encounter for examination of eyes and vision without abnormal findings: Secondary | ICD-10-CM

## 2013-09-03 DIAGNOSIS — I999 Unspecified disorder of circulatory system: Secondary | ICD-10-CM

## 2013-09-03 DIAGNOSIS — I798 Other disorders of arteries, arterioles and capillaries in diseases classified elsewhere: Secondary | ICD-10-CM

## 2013-09-03 DIAGNOSIS — Z23 Encounter for immunization: Secondary | ICD-10-CM

## 2013-09-03 DIAGNOSIS — I10 Essential (primary) hypertension: Secondary | ICD-10-CM

## 2013-09-03 DIAGNOSIS — E1165 Type 2 diabetes mellitus with hyperglycemia: Secondary | ICD-10-CM

## 2013-09-03 DIAGNOSIS — M549 Dorsalgia, unspecified: Secondary | ICD-10-CM

## 2013-09-03 LAB — POCT URINALYSIS DIPSTICK
Bilirubin, UA: NEGATIVE
Glucose, UA: NEGATIVE
Ketones, UA: NEGATIVE
Nitrite, UA: NEGATIVE
PH UA: 5
PROTEIN UA: NEGATIVE
SPEC GRAV UA: 1.01
UROBILINOGEN UA: 0.2

## 2013-09-03 MED ORDER — INSULIN REGULAR HUMAN 100 UNIT/ML IJ SOLN: INTRAMUSCULAR | Status: DC

## 2013-09-03 MED ORDER — TRAMADOL HCL 50 MG PO TABS: 100.0000 mg | ORAL_TABLET | Freq: Three times a day (TID) | ORAL | Status: DC | PRN

## 2013-09-03 NOTE — Patient Instructions (Addendum)
Increase neurontin to 400mg  three times daily to help with hip pain.  Follow up with Dr. .  Start Gentamicin ointment for skin lesions twice daily.  Try replacing 2 meals per day with a low-glycemic index protein shake, such as EAS from [employer].

## 2013-09-03 NOTE — Assessment & Plan Note (Signed)
BP Readings from Last 3 Encounters:  09/03/13 110/60  08/04/13 140/78  08/03/13 140/62   BP well controlled. Will continue current medication.

## 2013-09-03 NOTE — Assessment & Plan Note (Signed)
Persistent pain, improved somewhat with Neurontin. Will increase Neurontin to 400mg  tid. Reviewed xray today which was normal. Encouraged follow up with ortho. Prn tramadol for severe pain only.

## 2013-09-03 NOTE — Progress Notes (Signed)
Subjective:    Patient ID: [name] [name], female    DOB: 10/24/1934, 79 y.o.   MRN: [id number]  HPI 79YO female presents for follow up.  Right hip pain - Symptoms have improved somewhat with use of Neurontin. Continues to have some aching pain in right hip. Has not yet followed up with Dr. Martha Clan because of financial issues. No weakness in right leg. No falls.  DM - BG generally well controlled. Did not bring record today. Compliant with meds.  Urinary incontinence - Noted occasionally in the mornings mostly, unable to get to restroom. No dysuria, fever, chills, flank pain, hematuria.  Review of Systems  Constitutional: Negative for fever, chills, appetite change, fatigue and unexpected weight change.  HENT: Negative for congestion, ear pain, sinus pressure, sore throat, trouble swallowing and voice change.   Eyes: Negative for visual disturbance.  Respiratory: Negative for cough, shortness of breath, wheezing and stridor.   Cardiovascular: Negative for chest pain, palpitations and leg swelling.  Gastrointestinal: Negative for nausea, vomiting, abdominal pain, diarrhea, constipation, blood in stool, abdominal distention and anal bleeding.  Genitourinary: Positive for urgency and frequency. Negative for dysuria and flank pain.  Musculoskeletal: Positive for arthralgias and back pain. Negative for gait problem, myalgias and neck pain.  Skin: Negative for color change and rash.  Neurological: Negative for dizziness and headaches.  Hematological: Negative for adenopathy. Does not bruise/bleed easily.  Psychiatric/Behavioral: Negative for suicidal ideas, sleep disturbance and dysphoric mood. The patient is not nervous/anxious.        Objective:    BP 110/60  Pulse 65  Resp 16  Wt 228 lb 8 oz (103.647 kg)  SpO2 95% Physical Exam  Constitutional: She is oriented to person, place, and time. She appears well-developed and well-nourished. No distress.  HENT:  Head: Normocephalic  and atraumatic.  Right Ear: External ear normal.  Left Ear: External ear normal.  Nose: Nose normal.  Mouth/Throat: Oropharynx is clear and moist. No oropharyngeal exudate.  Eyes: Conjunctivae are normal. Pupils are equal, round, and reactive to light. Right eye exhibits no discharge. Left eye exhibits no discharge. No scleral icterus.  Neck: Normal range of motion. Neck supple. No tracheal deviation present. No thyromegaly present.  Cardiovascular: Normal rate, regular rhythm, normal heart sounds and intact distal pulses.  Exam reveals no gallop and no friction rub.   No murmur heard. Pulmonary/Chest: Effort normal and breath sounds normal. No accessory muscle usage. Not tachypneic. No respiratory distress. She has no decreased breath sounds. She has no wheezes. She has no rhonchi. She has no rales. She exhibits no tenderness.  Musculoskeletal: Normal range of motion. She exhibits no edema.       Right hip: She exhibits tenderness. She exhibits normal range of motion, normal strength, no bony tenderness and no swelling.  Lymphadenopathy:    She has no cervical adenopathy.  Neurological: She is alert and oriented to person, place, and time. No cranial nerve deficit. She exhibits normal muscle tone. Coordination normal.  Skin: Skin is warm and dry. No rash noted. She is not diaphoretic. No erythema. No pallor.  Psychiatric: She has a normal mood and affect. Her behavior is normal. Judgment and thought content normal.          Assessment & Plan:   Problem List Items Addressed This Visit   Diabetic eye exam   Relevant Medications      insulin regular (NOVOLIN R RELION) 100 units/mL injection   Other Relevant Orders  Ambulatory referral to Ophthalmology      POCT urinalysis dipstick (Completed)      Urine culture   Hypertension      BP Readings from Last 3 Encounters:  09/03/13 110/60  08/04/13 140/78  08/03/13 140/62   BP well controlled. Will continue current medication.      Relevant Orders      POCT urinalysis dipstick (Completed)      Urine culture   Poorly controlled type 2 diabetes mellitus with circulatory disorder - Primary (Chronic)      Lab Results  Component Value Date   HGBA1C 7.8* 07/01/2013   BG stable per pt. Will continue current medications. Follow up 1 month with repeat A1c.    Relevant Medications      insulin regular (NOVOLIN R RELION) 100 units/mL injection   Other Relevant Orders      POCT urinalysis dipstick (Completed)      Urine culture   Right hip pain     Persistent pain, improved somewhat with Neurontin. Will increase Neurontin to 400mg  tid. Reviewed xray today which was normal. Encouraged follow up with ortho. Prn tramadol for severe pain only.    Relevant Orders      POCT urinalysis dipstick (Completed)      Urine culture    Other Visit Diagnoses   Back pain        Relevant Medications       traMADol (ULTRAM) 50 MG tablet    Other Relevant Orders       POCT urinalysis dipstick (Completed)       Urine culture    Need for prophylactic vaccination against Streptococcus pneumoniae (pneumococcus)        Relevant Orders       Pneumococcal conjugate vaccine 13-valent (Completed)       POCT urinalysis dipstick (Completed)       Urine culture        Return in about 1 month (around 10/03/2013) for Recheck of Diabetes.

## 2013-09-03 NOTE — Progress Notes (Signed)
Pre visit review using our clinic review tool, if applicable. No additional management support is needed unless otherwise documented below in the visit note. 

## 2013-09-03 NOTE — Assessment & Plan Note (Signed)
Lab Results  Component Value Date   HGBA1C 7.8* 07/01/2013   BG stable per pt. Will continue current medications. Follow up 1 month with repeat A1c.

## 2013-09-05 LAB — URINE CULTURE
COLONY COUNT: NO GROWTH
Organism ID, Bacteria: NO GROWTH

## 2013-09-06 ENCOUNTER — Encounter: Payer: Self-pay | Admitting: Emergency Medicine

## 2013-09-06 ENCOUNTER — Encounter: Payer: Self-pay | Admitting: *Deleted

## 2013-09-27 ENCOUNTER — Telehealth: Payer: Self-pay | Admitting: Internal Medicine

## 2013-09-27 DIAGNOSIS — I10 Essential (primary) hypertension: Secondary | ICD-10-CM

## 2013-09-27 MED ORDER — LISINOPRIL 10 MG PO TABS: 10.0000 mg | ORAL_TABLET | Freq: Every day | ORAL | Status: DC

## 2013-09-27 NOTE — Telephone Encounter (Signed)
t has not received mail order, needs Rx sent to [facility].

## 2013-09-27 NOTE — Telephone Encounter (Signed)
lisinopril (PRINIVIL,ZESTRIL) 10 MG tablet °

## 2013-10-06 ENCOUNTER — Ambulatory Visit (INDEPENDENT_AMBULATORY_CARE_PROVIDER_SITE_OTHER): Payer: Commercial Managed Care - HMO | Admitting: Internal Medicine

## 2013-10-06 ENCOUNTER — Encounter: Payer: Self-pay | Admitting: Internal Medicine

## 2013-10-06 ENCOUNTER — Telehealth: Payer: Self-pay | Admitting: Internal Medicine

## 2013-10-06 ENCOUNTER — Encounter (INDEPENDENT_AMBULATORY_CARE_PROVIDER_SITE_OTHER): Payer: Self-pay

## 2013-10-06 VITALS — BP 136/60 | HR 58 | Temp 98.2°F | Ht 63.5 in | Wt 225.5 lb

## 2013-10-06 DIAGNOSIS — E039 Hypothyroidism, unspecified: Secondary | ICD-10-CM

## 2013-10-06 DIAGNOSIS — N390 Urinary tract infection, site not specified: Secondary | ICD-10-CM

## 2013-10-06 DIAGNOSIS — E1165 Type 2 diabetes mellitus with hyperglycemia: Secondary | ICD-10-CM

## 2013-10-06 DIAGNOSIS — I10 Essential (primary) hypertension: Secondary | ICD-10-CM

## 2013-10-06 DIAGNOSIS — E1159 Type 2 diabetes mellitus with other circulatory complications: Secondary | ICD-10-CM

## 2013-10-06 DIAGNOSIS — I798 Other disorders of arteries, arterioles and capillaries in diseases classified elsewhere: Secondary | ICD-10-CM

## 2013-10-06 DIAGNOSIS — I999 Unspecified disorder of circulatory system: Secondary | ICD-10-CM

## 2013-10-06 DIAGNOSIS — I2510 Atherosclerotic heart disease of native coronary artery without angina pectoris: Secondary | ICD-10-CM

## 2013-10-06 DIAGNOSIS — R300 Dysuria: Secondary | ICD-10-CM

## 2013-10-06 DIAGNOSIS — E785 Hyperlipidemia, unspecified: Secondary | ICD-10-CM

## 2013-10-06 LAB — LIPID PANEL
CHOL/HDL RATIO: 3
Cholesterol: 130 mg/dL (ref 0–200)
HDL: 40 mg/dL (ref >39.00)
LDL CALC: 63 mg/dL (ref 0–99)
TRIGLYCERIDES: 136 mg/dL (ref 0.0–149.0)
VLDL: 27.2 mg/dL (ref 0.0–40.0)

## 2013-10-06 LAB — POCT URINALYSIS DIPSTICK
BILIRUBIN UA: NEGATIVE
GLUCOSE UA: NEGATIVE
KETONES UA: NEGATIVE
Nitrite, UA: NEGATIVE
Protein, UA: 100
SPEC GRAV UA: 1.02
UROBILINOGEN UA: 0.2
pH, UA: 5.5

## 2013-10-06 LAB — COMPREHENSIVE METABOLIC PANEL
ALK PHOS: 104 U/L (ref 39–117)
ALT: 14 U/L (ref 0–35)
AST: 15 U/L (ref 0–37)
Albumin: 3.6 g/dL (ref 3.5–5.2)
BUN: 21 mg/dL (ref 6–23)
CHLORIDE: 110 meq/L (ref 96–112)
CO2: 24 meq/L (ref 19–32)
Calcium: 9.5 mg/dL (ref 8.4–10.5)
Creatinine, Ser: 0.9 mg/dL (ref 0.4–1.2)
GFR: 61.46 mL/min (ref >60.00)
GLUCOSE: 156 mg/dL — AB (ref 70–99)
Potassium: 4.3 mEq/L (ref 3.5–5.1)
SODIUM: 141 meq/L (ref 135–145)
Total Bilirubin: 0.7 mg/dL (ref 0.2–1.2)
Total Protein: 6.9 g/dL (ref 6.0–8.3)

## 2013-10-06 LAB — TSH: TSH: 0.72 u[IU]/mL (ref 0.35–4.50)

## 2013-10-06 LAB — MICROALBUMIN / CREATININE URINE RATIO
Creatinine,U: 95.3 mg/dL
Microalb Creat Ratio: 34.6 mg/g — ABNORMAL HIGH (ref 0.0–30.0)
Microalb, Ur: 33 mg/dL — ABNORMAL HIGH (ref 0.0–1.9)

## 2013-10-06 LAB — HEMOGLOBIN A1C: Hgb A1c MFr Bld: 8.3 % — ABNORMAL HIGH (ref 4.6–6.5)

## 2013-10-06 LAB — T4, FREE: Free T4: 1.21 ng/dL (ref 0.60–1.60)

## 2013-10-06 MED ORDER — CIPROFLOXACIN HCL 500 MG PO TABS: 500.0000 mg | ORAL_TABLET | Freq: Two times a day (BID) | ORAL | Status: DC

## 2013-10-06 NOTE — Assessment & Plan Note (Signed)
Will check A1c with labs today. Foot exam normal today. 

## 2013-10-06 NOTE — Assessment & Plan Note (Signed)
One recent episode of chest pain, however this occurred in setting of anxiety. Symptoms improved with NTG. Encouraged follow up with her cardiologist. Encouraged compliance with medication. She will call if any recurrent symptoms.

## 2013-10-06 NOTE — Patient Instructions (Addendum)
We will check labs today.  Continue current medications.  Start Cipro 500mg  twice daily for urinary tract infection.  Follow up for Wellness Visit in 3 months.

## 2013-10-06 NOTE — Assessment & Plan Note (Signed)
Symptoms and UA c/w UTI. Will start Cipro. Send urine for culture. Continue Azo prn. Follow up if symptoms are not improving.

## 2013-10-06 NOTE — Assessment & Plan Note (Signed)
Will check lipids and LFT with labs today. Continue Atorvastatin.

## 2013-10-06 NOTE — Assessment & Plan Note (Signed)
BP Readings from Last 3 Encounters:  10/06/13 136/60  09/03/13 110/60  08/04/13 140/78   BP well controlled on current medications.Will check renal function today.

## 2013-10-06 NOTE — Progress Notes (Signed)
Pre visit review using our clinic review tool, if applicable. No additional management support is needed unless otherwise documented below in the visit note. 

## 2013-10-06 NOTE — Progress Notes (Signed)
Subjective:    Patient ID: [name] [name], female    DOB: 07-25-1934, 79 y.o.   MRN: [id number]  HPI 79YO female presents for follow up.  DM - BG 85 this morning. Most sugars well controlled. Couple of blood sugars over 200. Compliant with meds.  Dysuria - Having some burning with urination with some lower pelvic pressure, over last 1.5 weeks. Took Azo with no improvement. No fever, flank pain.  Sunday morning had some woke up with pressure in left chest. Took 3 NTG with some improvement. No recurrent episodes. Compliant with meds.  Review of Systems  Constitutional: Negative for fever, chills, appetite change, fatigue and unexpected weight change.  HENT: Negative for congestion, ear pain, sinus pressure, sore throat, trouble swallowing and voice change.   Eyes: Negative for visual disturbance.  Respiratory: Negative for cough, shortness of breath, wheezing and stridor.   Cardiovascular: Negative for chest pain, palpitations and leg swelling.  Gastrointestinal: Negative for nausea, vomiting, abdominal pain, diarrhea, constipation, blood in stool, abdominal distention and anal bleeding.  Genitourinary: Negative for dysuria and flank pain.  Musculoskeletal: Positive for arthralgias and myalgias. Negative for gait problem and neck pain.  Skin: Negative for color change and rash.  Neurological: Negative for dizziness and headaches.  Hematological: Negative for adenopathy. Does not bruise/bleed easily.  Psychiatric/Behavioral: Negative for suicidal ideas, sleep disturbance and dysphoric mood. The patient is nervous/anxious (intermittent).        Objective:    BP 136/60  Pulse 58  Temp(Src) 98.2 F (36.8 C) (Oral)  Ht 5' 3.5" (1.613 m)  Wt 225 lb 8 oz (102.286 kg)  BMI 39.31 kg/m2  SpO2 97% Physical Exam  Constitutional: She is oriented to person, place, and time. She appears well-developed and well-nourished. No distress.  HENT:  Head: Normocephalic and atraumatic.  Right  Ear: External ear normal.  Left Ear: External ear normal.  Nose: Nose normal.  Mouth/Throat: Oropharynx is clear and moist. No oropharyngeal exudate.  Eyes: Conjunctivae are normal. Pupils are equal, round, and reactive to light. Right eye exhibits no discharge. Left eye exhibits no discharge. No scleral icterus.  Neck: Normal range of motion. Neck supple. No tracheal deviation present. No thyromegaly present.  Cardiovascular: Normal rate, regular rhythm, normal heart sounds and intact distal pulses.  Exam reveals no gallop and no friction rub.   No murmur heard. Pulmonary/Chest: Effort normal and breath sounds normal. No accessory muscle usage. Not tachypneic. No respiratory distress. She has no decreased breath sounds. She has no wheezes. She has no rhonchi. She has no rales. She exhibits no tenderness.  Musculoskeletal: Normal range of motion. She exhibits no edema and no tenderness.  Lymphadenopathy:    She has no cervical adenopathy.  Neurological: She is alert and oriented to person, place, and time. No cranial nerve deficit. She exhibits normal muscle tone. Coordination normal.  Skin: Skin is warm and dry. No rash noted. She is not diaphoretic. No erythema. No pallor.  Psychiatric: She has a normal mood and affect. Her behavior is normal. Judgment and thought content normal.          Assessment & Plan:   Problem List Items Addressed This Visit     High   CAD (coronary artery disease)     One recent episode of chest pain, however this occurred in setting of anxiety. Symptoms improved with NTG. Encouraged follow up with her cardiologist. Encouraged compliance with medication. She will call if any recurrent symptoms.    Hyperlipidemia  Will check lipids and LFT with labs today. Continue Atorvastatin.    Relevant Orders      Lipid panel   Hypertension      BP Readings from Last 3 Encounters:  10/06/13 136/60  09/03/13 110/60  08/04/13 140/78   BP well controlled on  current medications.Will check renal function today.    Hypothyroidism     Will check TSH and T4 with labs today. Continue Levothyroxine.    Relevant Orders      TSH      T4, free   Poorly controlled type 2 diabetes mellitus with circulatory disorder - Primary (Chronic)     Will check A1c with labs today. Foot exam normal today.    Relevant Orders      Comprehensive metabolic panel      Hemoglobin A1c      Microalbumin / creatinine urine ratio     Unprioritized   Infection of urinary tract     Symptoms and UA c/w UTI. Will start Cipro. Send urine for culture. Continue Azo prn. Follow up if symptoms are not improving.    Relevant Medications      ciprofloxacin (CIPRO) tablet   Other Relevant Orders      CULTURE, URINE COMPREHENSIVE    Other Visit Diagnoses   Dysuria        Relevant Orders       POCT Urinalysis Dipstick (Completed)        Return in about 3 months (around 01/06/2014) for Wellness Visit.

## 2013-10-06 NOTE — Assessment & Plan Note (Signed)
Will check TSH and T4 with labs today. Continue Levothyroxine.

## 2013-10-06 NOTE — Telephone Encounter (Signed)
States she forgot at appointment to ask Dr. Dan Humphreys if she is to continue D3-2000.  States if so, she is out and needs it called to [facility]

## 2013-10-08 NOTE — Telephone Encounter (Signed)
Pt notified and verbalized understanding.

## 2013-10-08 NOTE — Telephone Encounter (Signed)
Do you want me to refill? 

## 2013-10-08 NOTE — Telephone Encounter (Signed)
She should continue this, however this is available over the counter.

## 2013-10-09 LAB — CULTURE, URINE COMPREHENSIVE: Colony Count: >=100000

## 2013-10-13 ENCOUNTER — Telehealth: Payer: Self-pay | Admitting: Internal Medicine

## 2013-10-13 NOTE — Telephone Encounter (Signed)
Pt states that she is not going to the ED, she says she is fine besides the antibiotic making her sick.  Pt feels as though the antibiotic is too strong.  Advised pt to take the medication with food.

## 2013-10-13 NOTE — Telephone Encounter (Signed)
Please advise 

## 2013-10-13 NOTE — Telephone Encounter (Signed)
Patient Information:  Caller Name: Jayma  Phone: [phone]  Patient: [name] [name]  Gender: Female  DOB: 1934/07/07  Age: 79 Years  PCP: Ronna Polio (Adults only)  Office Follow Up:  Does the office need to follow up with this patient?: Yes  Instructions For The Office: Please call to advise if must be seen;  Requesting Rx for nausea.  RN Note:  FBS 69.  Withheld diabetes meds this morning.  Weakness present.  Has not eaten anything for 2 days because there is nothing in the house but canned goods.  Drank 4 oz milk mixed with Pepsi.  Repeat blood sugar 201 at 1040.  Reports anorexia and poor fluid intake.  Blood sugar below 80 "a couple of times" in the past week.   Mild diarrhea present.  Voided small amounts at at 0100 and 1030.  Not able to sleep for 2 days.  RN suggested appointment due to poor hydration, weakness and hypoglycemia.  Declined appointment since she has "no one to drive me there."  Stopped taking Cipro this morning, 10/13/13.  Dysuria improved and urine color is lighter. Reports clear "stuff" coming out of navel with foul odor. Has no food in home or someone to get it. Family members do not come visit. [facility] will deliver Rx if ordered before 1300. Explained importance of more frequent blood sugar testing when has an infection or sympotoms of hypoglycemia.  Symptoms  Reason For Call & Symptoms: Nausea on antibiotic for UTI with upper abdominal pain.  Started on Cipro BID 10/07/13 for 7 days.  Reviewed Health History In EMR: Yes  Reviewed Medications In EMR: Yes  Reviewed Allergies In EMR: Yes  Reviewed Surgeries / Procedures: Yes  Date of Onset of Symptoms: 10/11/2013  Treatments Tried: Pepto Bismol  Treatments Tried Worked: No  Guideline(s) Used:  Diabetes - Low Blood Sugar  Disposition Per Guideline:   Discuss with PCP and Callback by Nurse Today  Reason For Disposition Reached:   Morning (before breakfast) blood glucose < 80 mg/dl (4.5 mmol/L)  and more than once in past week  Advice Given:  Reassurance  It sounds like an episode of low blood sugar (hypoglycemia) that we can treat at home.  Low blood sugar can result from taking too much diabetes medication, delayed meals, strenuous exercise, or a combination of these factors.  Prevention  Meals: Do not skip or delay meals. Try to eat meals and snacks at the same time every day.  Glucose: Keep some type of sugar (e.g., glucose tablets or gels, honey, juice box) with you at all times. Do you have them available at work, at school, during exercise, and in your car?  Inform Your Friends and Family  If you take insulin or any other diabetic medication, you are at risk of having a hypoglycemic spell. Inform your family, close friends, and coworkers that you have diabetes and what to do if you have hypoglycemia.  Wear a medical alert bracelet that identifies that you have diabetes.  General  Check your blood glucose every 2-4 hours. Write down the results.  Drink liquids. It is important to prevent dehydration. Drink small amounts frequently.  Avoid hypoglycemia. If your appetite is bad, you are not eating solid food, and your blood glucose is less than 200 mg /dl (11 mmol/l), then you should be drinking sugar containing liquids. Examples are soda, clear juices, sports drinks.  Call Back If:  You become worse or have more questions.  Patient Will Follow  Care Advice:  YES

## 2013-10-13 NOTE — Telephone Encounter (Signed)
Needs to be seen. If she cannot arrange transportation, then needs to go by EMS to ED.

## 2013-10-19 ENCOUNTER — Telehealth: Payer: Self-pay

## 2013-10-19 ENCOUNTER — Encounter: Payer: Self-pay | Admitting: Nurse Practitioner

## 2013-10-19 ENCOUNTER — Ambulatory Visit (INDEPENDENT_AMBULATORY_CARE_PROVIDER_SITE_OTHER): Payer: Commercial Managed Care - HMO | Admitting: Nurse Practitioner

## 2013-10-19 VITALS — BP 165/79 | HR 58 | Ht 63.0 in | Wt 223.5 lb

## 2013-10-19 DIAGNOSIS — R0602 Shortness of breath: Secondary | ICD-10-CM

## 2013-10-19 DIAGNOSIS — R002 Palpitations: Secondary | ICD-10-CM

## 2013-10-19 DIAGNOSIS — E119 Type 2 diabetes mellitus without complications: Secondary | ICD-10-CM

## 2013-10-19 DIAGNOSIS — I10 Essential (primary) hypertension: Secondary | ICD-10-CM

## 2013-10-19 DIAGNOSIS — E785 Hyperlipidemia, unspecified: Secondary | ICD-10-CM

## 2013-10-19 DIAGNOSIS — R42 Dizziness and giddiness: Secondary | ICD-10-CM

## 2013-10-19 DIAGNOSIS — N390 Urinary tract infection, site not specified: Secondary | ICD-10-CM

## 2013-10-19 DIAGNOSIS — I2510 Atherosclerotic heart disease of native coronary artery without angina pectoris: Secondary | ICD-10-CM

## 2013-10-19 MED ORDER — MECLIZINE HCL 25 MG PO TABS: 25.0000 mg | ORAL_TABLET | Freq: Two times a day (BID) | ORAL | Status: DC

## 2013-10-19 NOTE — Progress Notes (Signed)
Patient Name: [name] [name] Date of Encounter: 10/19/2013  Primary Care Provider:  [name], MD Primary Cardiologist:  Concha Se, MD   Patient Profile  79 y/o female added on today secondary to a several wk h/o progressive dizziness.  Problem List   Past Medical History  Diagnosis Date  . DM (diabetes mellitus)   . Paroxysmal atrial flutter   . Dyslipidemia   . CAD (coronary artery disease)     a. s/p prior PCI RCA with ISR req CBA;  b. 04/2013 Neg MV;  c. 05/2013 Cath: LM 20, LAD 30p, LCX 20p, OM1 90 small, RCA 54m, PDA 40-->Med Rx.  . Osteoarthritis   . Hypothyroidism     hx  . Rheumatoid arthritis(714.0)   . Obesity   . GERD (gastroesophageal reflux disease)   . Depression   . Shoulder fracture, left     Dr. Martha Clan   Past Surgical History  Procedure Laterality Date  . Rca stent placement  2000  . Restenosis with ptca placement  2005  . Cataract surgery    . Partial hysterectomy    . Replacement total knee  2013    right  . Shoulder surgery  02/04/13  . Cardiac catheterization  06/11/2013  . Coronary angioplasty      Allergies  Allergies  Allergen Reactions  . Oxycodone Nausea And Vomiting    HPI  79 y/o female with the above complex problem list.  She has some degree of chronic, intermittent, fleeting, focal, left sided chest discomfort, for which she underwent MV in 04/2013 (nl) and subsequently cath in 05/2013, revealing predominantly nonobs CAD and a 90% stenosis within a small OM1.  She has been medically managed since.  She says that she has been having dizziness and vertiginous Ss worsened by position changes and rotation of her head over the past several wks.  She primary care on 5/20, but it does not appear that she mentioned these Ss.  She was found to have a UTI in the setting of dysuria and was placed on cipro.  She says that she didn't tolerate this, developing n, v, anorexia, and self-d/c'd if after 3 doses.  She was not placed on  an alternate abx.  She also says that er lantus was increased from 36U daily to 60U daily.  She only went up to 40U daily for fear of hypoglycemia.  Unfortunately, since then, her dizziness has persisted along with generalized malaise.  She continues to have intermittent fleeting chest discomfort, similar to that which was worked up earlier this year and also experiences DOE (chronic), and mild lower extremity edema when she does not elevate her legs.  She has also been experiencing intermittent tachypalpitations.   Last night, she felt very dizzy before bed and checked her blood glucose and found it to be 36.  She drank some ginger ale and orange juice w/ some improvement in dizziness.  When she awoke this AM, she continued to feel dizzy and called the office and was added onto my schedule for today.  Here, ECG shows sinus brady/arrhythmia w/o acute changes.  VS stable.  Home Medications  Prior to Admission medications   Medication Sig Start Date End Date Taking? Authorizing Provider  albuterol (PROVENTIL HFA;VENTOLIN HFA) 108 (90 BASE) MCG/ACT inhaler Inhale 2 puffs into the lungs every 6 (six) hours as needed for wheezing or shortness of breath. 06/28/13  Yes Joaquim Nam, MD  amLODipine (NORVASC) 10 MG tablet Take 1 tablet (10  mg total) by mouth daily. 07/01/13  Yes Shelia Media, MD  aspirin 81 MG tablet Take by mouth daily.    Yes Historical Provider, MD  atorvastatin (LIPITOR) 20 MG tablet Take 1 tablet (20 mg total) by mouth daily. 07/02/13  Yes Shelia Media, MD  cyclobenzaprine (FLEXERIL) 5 MG tablet Take 1 tablet (5 mg total) by mouth 3 (three) times daily as needed. 08/06/13  Yes Shelia Media, MD  ferrous sulfate 325 (65 FE) MG tablet Take 325 mg by mouth daily with breakfast.   Yes Historical Provider, MD  fluticasone (FLONASE) 50 MCG/ACT nasal spray Place 2 sprays into the nose daily. 06/12/12  Yes Shelia Media, MD  furosemide (LASIX) 40 MG tablet Take 1 tablet (40 mg  total) by mouth 2 (two) times daily. 07/01/13  Yes Shelia Media, MD  gabapentin (NEURONTIN) 300 MG capsule Take 1 capsule (300 mg total) by mouth 3 (three) times daily. 08/04/13  Yes Shelia Media, MD  insulin glargine (LANTUS) 100 UNIT/ML injection Inject 0.5 mLs (50 Units total) into the skin at bedtime. 07/01/13 08/26/15 Yes Shelia Media, MD  insulin regular (NOVOLIN R RELION) 100 units/mL injection INJECT  SUBCUTANEOUSLY 4  UNITS  IF  BLOOD  SUGAR  IS  GREATER  THAN  250 09/03/13  Yes Shelia Media, MD  isosorbide mononitrate (ISMO,MONOKET) 10 MG tablet Take 1 tablet (10 mg total) by mouth 2 (two) times daily. 09/21/12  Yes Shelia Media, MD  latanoprost (XALATAN) 0.005 % ophthalmic solution Place 1 drop into both eyes at bedtime.  04/06/12  Yes Historical Provider, MD  levothyroxine (LEVOTHROID) 150 MCG tablet Take 1 tablet (150 mcg total) by mouth daily. 07/01/13 07/01/14 Yes Shelia Media, MD  lisinopril (PRINIVIL,ZESTRIL) 10 MG tablet Take 1 tablet (10 mg total) by mouth daily. 09/27/13  Yes Shelia Media, MD  meclizine (ANTIVERT) 25 MG tablet Take 1 tablet (25 mg total) by mouth 2 (two) times daily. As needed for dizziness or nausea 10/19/13  Yes Ok Anis, NP  metFORMIN (GLUCOPHAGE) 500 MG tablet Take 1 tablet (500 mg total) by mouth 2 (two) times daily. 07/01/13  Yes Shelia Media, MD  metoprolol tartrate (LOPRESSOR) 25 MG tablet Take 1 tablet (25 mg total) by mouth 2 (two) times daily as needed. 07/01/13 07/01/14 Yes Shelia Media, MD  nitroGLYCERIN (NITROSTAT) 0.4 MG SL tablet Place 1 tablet (0.4 mg total) under the tongue every 5 (five) minutes as needed. 06/16/13  Yes Raquel Rey, NP  nystatin cream (MYCOSTATIN) Apply topically 2 (two) times daily. 09/21/12  Yes Shelia Media, MD  nystatin-triamcinolone (MYCOLOG II) cream Apply 1 application topically 2 (two) times daily as needed. 03/05/12  Yes Shelia Media, MD  omeprazole (PRILOSEC) 20 MG capsule  Take 1 capsule (20 mg total) by mouth 2 (two) times daily. 06/15/12 10/19/13 Yes Shelia Media, MD  potassium chloride SA (K-DUR,KLOR-CON) 20 MEQ tablet Take 1 tablet (20 mEq total) by mouth 2 (two) times daily. 06/12/12  Yes Shelia Media, MD  traMADol (ULTRAM) 50 MG tablet Take 2 tablets (100 mg total) by mouth every 8 (eight) hours as needed. 09/03/13  Yes Shelia Media, MD    Review of Systems  Multiple complaints as above - generalized malaise w/o fevers/chills; dizziness (room spinning), chronic fleeting c/p, chronic DOE, hypoglycemia, , palpitations, mild LEE.  All other systems reviewed and are otherwise negative except as noted above.  Physical Exam  Blood pressure 165/79, pulse 58, height 5\' 3"  (1.6 m), weight 223 lb 8 oz (101.379 kg).  Orthostatics: Lying: 144/81, HR 60 Sitting: 130/79, HR 59 (dizzy) Standing 139/60, HR 68 (dizzy) Standing 5 mins: 125/76, HR 71. General: Pleasant, NAD Psych: Normal affect. Neuro: Alert and oriented X 3. Moves all extremities spontaneously. HEENT: Normal  Neck: Supple without bruits or JVD. Lungs:  Resp regular and unlabored, CTA. Heart: RRR no s3, s4, or murmurs. Abdomen: Soft, non-tender, non-distended, BS + x 4.  Extremities: No clubbing, cyanosis.  Trace bilat LE edema. DP/PT/Radials 2+ and equal bilaterally.  Accessory Clinical Findings  ECG - sinus brady with sinus arrhythmia, minimal lateral ST depression - not acutely changed.  Assessment & Plan  1.  Dizziness/Vertigo:  Pt presents with a several wk h/o dizziness that is worsened with position changes and head movements.  Likely multifactorial in the setting of mild orthostasis and intermittent hypoglycemia.  She does have a h/o vertigo and has a Rx for meclizine @ home but hasn't used any despite her Ss.  I've advised that she take meclizine 25mg  bid over the next week.  As she reports palpitations I did offer her an event monitor, especially since she has a h/o aflutter,  however she requested to defer and wishes to see if she feels better on meclizine.  2.  Chronic chest pain/CAD:  S/p cath earlier this year for ongoing Ss, which showed stable, predominantly nonobstructive anatomy with small vessel OM dzs.  Cont med Rx with asa, statin, bb, nitrate.  ECG non-acute.  No further w/u @ this time.  3.  HTN:  With some degree of orthostasis.  Cont current regimen.  4. HL:  On statin.  5.  Palpitations:  Reports occas tachypalps.  Deferred monitor for now.  Recent BMET and TSH were wnl.  Cont BB.  6.  DM II/Hypoglycemia:  She has cut her lantus back to 40 U daily.  Defer further mgmt to IM and have recommended that she f/u sooner than her August appt.  7.  Recent UTI:  She stopped taking cipro after only 3 doses.  She denies Ss at this time.  Will repeat UA/UC.  8.  Disposition:  Rec f/u with primary care within next 2 wks.  Ok Anis, NP 10/19/2013, 2:51 PM

## 2013-10-19 NOTE — Patient Instructions (Signed)
Please go to the medical mall entrance of [facility] for a urinalysis and urine culture Please take meclizine 25mg  twice daily  Please follow up with your primary care doctor in 1-2 weeks

## 2013-10-19 NOTE — Telephone Encounter (Signed)
Spoke w/ pt.  She reports that she had fixed herself some lunch and as she sat down to eat, she felt like she was going to pass out.  Reports dizziness and near syncope. Reports that her BP monitor will not give her HR, it just flashes.  Pt states that her fingers are numb and she is unable to feel her pulse. Reports that her BG last night was 36, today 200.   Dr. Dan Humphreys recently changed her insulin dose, but she has not implemented this change yet.  Pt is unsure if sx are r/t her heart or her DM. Offered pt to come in to see Ward Givens, NP this afternoon at 1:45. She will try to reach her son to bring her over and call if she cannot keep this appt.

## 2013-10-19 NOTE — Telephone Encounter (Signed)
Pt called and states she is having a irregular BP is  170/60, states" it doesn't even show up on my meter, states she feels like she is going to pass out"

## 2013-10-21 ENCOUNTER — Inpatient Hospital Stay (HOSPITAL_COMMUNITY)
Admission: EM | Admit: 2013-10-21 | Discharge: 2013-10-27 | DRG: 065 | Disposition: A | Discharge status: Home Health | Payer: Medicare HMO | Attending: Internal Medicine | Admitting: Internal Medicine

## 2013-10-21 ENCOUNTER — Emergency Department (HOSPITAL_COMMUNITY): Payer: Medicare HMO

## 2013-10-21 ENCOUNTER — Encounter (HOSPITAL_COMMUNITY): Payer: Self-pay | Admitting: Emergency Medicine

## 2013-10-21 DIAGNOSIS — I10 Essential (primary) hypertension: Secondary | ICD-10-CM | POA: Diagnosis present

## 2013-10-21 DIAGNOSIS — Z9861 Coronary angioplasty status: Secondary | ICD-10-CM

## 2013-10-21 DIAGNOSIS — M069 Rheumatoid arthritis, unspecified: Secondary | ICD-10-CM | POA: Diagnosis present

## 2013-10-21 DIAGNOSIS — F329 Major depressive disorder, single episode, unspecified: Secondary | ICD-10-CM

## 2013-10-21 DIAGNOSIS — Z1239 Encounter for other screening for malignant neoplasm of breast: Secondary | ICD-10-CM

## 2013-10-21 DIAGNOSIS — E1165 Type 2 diabetes mellitus with hyperglycemia: Secondary | ICD-10-CM | POA: Diagnosis present

## 2013-10-21 DIAGNOSIS — M1990 Unspecified osteoarthritis, unspecified site: Secondary | ICD-10-CM | POA: Diagnosis present

## 2013-10-21 DIAGNOSIS — M549 Dorsalgia, unspecified: Secondary | ICD-10-CM

## 2013-10-21 DIAGNOSIS — E119 Type 2 diabetes mellitus without complications: Secondary | ICD-10-CM

## 2013-10-21 DIAGNOSIS — I4892 Unspecified atrial flutter: Secondary | ICD-10-CM | POA: Diagnosis present

## 2013-10-21 DIAGNOSIS — K219 Gastro-esophageal reflux disease without esophagitis: Secondary | ICD-10-CM | POA: Diagnosis present

## 2013-10-21 DIAGNOSIS — E1159 Type 2 diabetes mellitus with other circulatory complications: Secondary | ICD-10-CM

## 2013-10-21 DIAGNOSIS — Z6838 Body mass index (BMI) 38.0-38.9, adult: Secondary | ICD-10-CM

## 2013-10-21 DIAGNOSIS — I2510 Atherosclerotic heart disease of native coronary artery without angina pectoris: Secondary | ICD-10-CM | POA: Diagnosis present

## 2013-10-21 DIAGNOSIS — I798 Other disorders of arteries, arterioles and capillaries in diseases classified elsewhere: Secondary | ICD-10-CM

## 2013-10-21 DIAGNOSIS — I6350 Cerebral infarction due to unspecified occlusion or stenosis of unspecified cerebral artery: Principal | ICD-10-CM | POA: Diagnosis present

## 2013-10-21 DIAGNOSIS — N390 Urinary tract infection, site not specified: Secondary | ICD-10-CM

## 2013-10-21 DIAGNOSIS — E039 Hypothyroidism, unspecified: Secondary | ICD-10-CM | POA: Diagnosis present

## 2013-10-21 DIAGNOSIS — F3289 Other specified depressive episodes: Secondary | ICD-10-CM

## 2013-10-21 DIAGNOSIS — R4701 Aphasia: Secondary | ICD-10-CM | POA: Diagnosis present

## 2013-10-21 DIAGNOSIS — E785 Hyperlipidemia, unspecified: Secondary | ICD-10-CM | POA: Diagnosis present

## 2013-10-21 DIAGNOSIS — Z01810 Encounter for preprocedural cardiovascular examination: Secondary | ICD-10-CM

## 2013-10-21 DIAGNOSIS — Z01818 Encounter for other preprocedural examination: Secondary | ICD-10-CM

## 2013-10-21 DIAGNOSIS — G8929 Other chronic pain: Secondary | ICD-10-CM

## 2013-10-21 DIAGNOSIS — Z0100 Encounter for examination of eyes and vision without abnormal findings: Secondary | ICD-10-CM

## 2013-10-21 DIAGNOSIS — I999 Unspecified disorder of circulatory system: Secondary | ICD-10-CM

## 2013-10-21 DIAGNOSIS — Z794 Long term (current) use of insulin: Secondary | ICD-10-CM

## 2013-10-21 DIAGNOSIS — R739 Hyperglycemia, unspecified: Secondary | ICD-10-CM

## 2013-10-21 DIAGNOSIS — I6529 Occlusion and stenosis of unspecified carotid artery: Secondary | ICD-10-CM | POA: Diagnosis present

## 2013-10-21 DIAGNOSIS — Z79899 Other long term (current) drug therapy: Secondary | ICD-10-CM

## 2013-10-21 DIAGNOSIS — M25551 Pain in right hip: Secondary | ICD-10-CM

## 2013-10-21 DIAGNOSIS — I4891 Unspecified atrial fibrillation: Secondary | ICD-10-CM | POA: Diagnosis present

## 2013-10-21 DIAGNOSIS — E669 Obesity, unspecified: Secondary | ICD-10-CM

## 2013-10-21 DIAGNOSIS — I639 Cerebral infarction, unspecified: Secondary | ICD-10-CM | POA: Diagnosis present

## 2013-10-21 DIAGNOSIS — Z96659 Presence of unspecified artificial knee joint: Secondary | ICD-10-CM

## 2013-10-21 LAB — GLUCOSE, CAPILLARY: Glucose-Capillary: 298 mg/dL — ABNORMAL HIGH (ref 70–99)

## 2013-10-21 LAB — CBC WITH DIFFERENTIAL/PLATELET
BASOS ABS: 0 10*3/uL (ref 0.0–0.1)
BASOS PCT: 0 % (ref 0–1)
Eosinophils Absolute: 0 10*3/uL (ref 0.0–0.7)
Eosinophils Relative: 0 % (ref 0–5)
HCT: 36 % (ref 36.0–46.0)
HEMOGLOBIN: 11.9 g/dL — AB (ref 12.0–15.0)
Lymphocytes Relative: 19 % (ref 12–46)
Lymphs Abs: 2 10*3/uL (ref 0.7–4.0)
MCH: 29 pg (ref 26.0–34.0)
MCHC: 33.1 g/dL (ref 30.0–36.0)
MCV: 87.6 fL (ref 78.0–100.0)
MONOS PCT: 9 % (ref 3–12)
Monocytes Absolute: 1 10*3/uL (ref 0.1–1.0)
Neutro Abs: 7.7 10*3/uL (ref 1.7–7.7)
Neutrophils Relative %: 72 % (ref 43–77)
Platelets: 284 10*3/uL (ref 150–400)
RBC: 4.11 MIL/uL (ref 3.87–5.11)
RDW: 14 % (ref 11.5–15.5)
WBC: 10.7 10*3/uL — ABNORMAL HIGH (ref 4.0–10.5)

## 2013-10-21 LAB — PROTIME-INR
INR: 1.05 (ref 0.00–1.49)
Prothrombin Time: 13.5 seconds (ref 11.6–15.2)

## 2013-10-21 LAB — COMPREHENSIVE METABOLIC PANEL
ALT: 7 U/L (ref 0–35)
AST: 14 U/L (ref 0–37)
Albumin: 3.4 g/dL — ABNORMAL LOW (ref 3.5–5.2)
Alkaline Phosphatase: 102 U/L (ref 39–117)
BILIRUBIN TOTAL: 1.1 mg/dL (ref 0.3–1.2)
BUN: 18 mg/dL (ref 6–23)
CO2: 24 mEq/L (ref 19–32)
Calcium: 9.5 mg/dL (ref 8.4–10.5)
Chloride: 102 mEq/L (ref 96–112)
Creatinine, Ser: 0.85 mg/dL (ref 0.50–1.10)
GFR calc Af Amer: 75 mL/min — ABNORMAL LOW (ref >90)
GFR calc non Af Amer: 65 mL/min — ABNORMAL LOW (ref >90)
Glucose, Bld: 335 mg/dL — ABNORMAL HIGH (ref 70–99)
POTASSIUM: 4.2 meq/L (ref 3.7–5.3)
Sodium: 141 mEq/L (ref 137–147)
Total Protein: 7.3 g/dL (ref 6.0–8.3)

## 2013-10-21 LAB — URINE MICROSCOPIC-ADD ON

## 2013-10-21 LAB — URINALYSIS, ROUTINE W REFLEX MICROSCOPIC
BILIRUBIN URINE: NEGATIVE
GLUCOSE, UA: 100 mg/dL — AB
KETONES UR: 15 mg/dL — AB
Leukocytes, UA: NEGATIVE
Nitrite: NEGATIVE
Specific Gravity, Urine: 1.019 (ref 1.005–1.030)
Urobilinogen, UA: 0.2 mg/dL (ref 0.0–1.0)
pH: 5.5 (ref 5.0–8.0)

## 2013-10-21 LAB — I-STAT TROPONIN, ED: Troponin i, poc: 0.04 ng/mL (ref 0.00–0.08)

## 2013-10-21 LAB — APTT: aPTT: 25 seconds (ref 24–37)

## 2013-10-21 LAB — RAPID URINE DRUG SCREEN, HOSP PERFORMED
AMPHETAMINES: NOT DETECTED
BENZODIAZEPINES: NOT DETECTED
Barbiturates: NOT DETECTED
COCAINE: NOT DETECTED
OPIATES: NOT DETECTED
TETRAHYDROCANNABINOL: NOT DETECTED

## 2013-10-21 LAB — ETHANOL: Alcohol, Ethyl (B): <11 mg/dL (ref 0–11)

## 2013-10-21 MED ORDER — TRAMADOL HCL 50 MG PO TABS: 100.0000 mg | ORAL_TABLET | Freq: Three times a day (TID) | ORAL | Status: DC | PRN

## 2013-10-21 MED ORDER — SENNOSIDES-DOCUSATE SODIUM 8.6-50 MG PO TABS: 1.0000 | ORAL_TABLET | Freq: Every evening | ORAL | Status: DC | PRN

## 2013-10-21 MED ORDER — LEVOTHYROXINE SODIUM 150 MCG PO TABS
150.0000 ug | ORAL_TABLET | Freq: Every day | ORAL | Status: DC
  Administered 2013-10-21 – 2013-10-27 (×7): 150 ug via ORAL
  Filled 2013-10-21 (×7): qty 1

## 2013-10-21 MED ORDER — ATORVASTATIN CALCIUM 20 MG PO TABS
20.0000 mg | ORAL_TABLET | Freq: Every day | ORAL | Status: DC
  Administered 2013-10-21 – 2013-10-27 (×7): 20 mg via ORAL
  Filled 2013-10-21 (×7): qty 1

## 2013-10-21 MED ORDER — SODIUM CHLORIDE 0.9 % IV SOLN
INTRAVENOUS | Status: DC
  Administered 2013-10-21: 17:00:00 via INTRAVENOUS

## 2013-10-21 MED ORDER — INSULIN GLARGINE 100 UNIT/ML ~~LOC~~ SOLN
50.0000 [IU] | Freq: Every day | SUBCUTANEOUS | Status: DC
  Administered 2013-10-21 – 2013-10-26 (×6): 50 [IU] via SUBCUTANEOUS
  Filled 2013-10-21 (×8): qty 0.5

## 2013-10-21 MED ORDER — GABAPENTIN 300 MG PO CAPS
300.0000 mg | ORAL_CAPSULE | Freq: Three times a day (TID) | ORAL | Status: DC
  Administered 2013-10-21 – 2013-10-27 (×19): 300 mg via ORAL
  Filled 2013-10-21 (×20): qty 1

## 2013-10-21 MED ORDER — FUROSEMIDE 40 MG PO TABS: 40.0000 mg | ORAL_TABLET | Freq: Every day | ORAL | Status: DC

## 2013-10-21 MED ORDER — SODIUM CHLORIDE 0.9 % IV BOLUS (SEPSIS)
500.0000 mL | Freq: Once | INTRAVENOUS | Status: AC
  Administered 2013-10-21: 500 mL via INTRAVENOUS

## 2013-10-21 MED ORDER — ASPIRIN 325 MG PO TABS
325.0000 mg | ORAL_TABLET | Freq: Every day | ORAL | Status: DC
  Administered 2013-10-21 – 2013-10-22 (×2): 325 mg via ORAL
  Filled 2013-10-21 (×2): qty 1

## 2013-10-21 MED ORDER — ASPIRIN 300 MG RE SUPP
300.0000 mg | Freq: Every day | RECTAL | Status: DC
  Filled 2013-10-21: qty 1

## 2013-10-21 MED ORDER — INSULIN ASPART 100 UNIT/ML ~~LOC~~ SOLN
0.0000 [IU] | Freq: Every day | SUBCUTANEOUS | Status: DC
  Administered 2013-10-22 – 2013-10-25 (×3): 3 [IU] via SUBCUTANEOUS
  Administered 2013-10-26: 23:00:00 via SUBCUTANEOUS

## 2013-10-21 MED ORDER — INSULIN ASPART 100 UNIT/ML ~~LOC~~ SOLN
0.0000 [IU] | Freq: Three times a day (TID) | SUBCUTANEOUS | Status: DC
  Administered 2013-10-21: 8 [IU] via SUBCUTANEOUS
  Administered 2013-10-22 (×3): 3 [IU] via SUBCUTANEOUS
  Administered 2013-10-23: 8 [IU] via SUBCUTANEOUS
  Administered 2013-10-23: 11 [IU] via SUBCUTANEOUS
  Administered 2013-10-23: 3 [IU] via SUBCUTANEOUS
  Administered 2013-10-24: 5 [IU] via SUBCUTANEOUS
  Administered 2013-10-24: 3 [IU] via SUBCUTANEOUS
  Administered 2013-10-24: 8 [IU] via SUBCUTANEOUS
  Administered 2013-10-25 (×2): 3 [IU] via SUBCUTANEOUS
  Administered 2013-10-25: 8 [IU] via SUBCUTANEOUS
  Administered 2013-10-26: 3 [IU] via SUBCUTANEOUS
  Administered 2013-10-26: 2 [IU] via SUBCUTANEOUS
  Administered 2013-10-26: 8 [IU] via SUBCUTANEOUS
  Administered 2013-10-27: 2 [IU] via SUBCUTANEOUS
  Administered 2013-10-27: 3 [IU] via SUBCUTANEOUS

## 2013-10-21 MED ORDER — ENOXAPARIN SODIUM 40 MG/0.4ML ~~LOC~~ SOLN
40.0000 mg | SUBCUTANEOUS | Status: DC
  Administered 2013-10-21: 40 mg via SUBCUTANEOUS
  Filled 2013-10-21 (×2): qty 0.4

## 2013-10-21 NOTE — Progress Notes (Signed)
Patient admitted to room 4N13 and noted severe expressive aphasia. During assessment, patient started speaking a little clearly in simple words. Swallow eval repeated at bedside per Dr. Vanessa Barbara and patient passed.

## 2013-10-21 NOTE — ED Notes (Signed)
Pt brought by EMS for AMS.  Pt lives at home alone and did not answer phone this am.   Daughter called 911 and EMS/[employer] went to house and was let in by landlord.  Pt was found sitting at kitchen table confused.  CBG 325, 140/80, hr 70.  Pt able to follow commands, but states 89 when asked age.  Per EMS, pt stated she couldn't smile and was having difficulty swallowing.  Daughter Edson Snowball [phone] is aware pt is here.

## 2013-10-21 NOTE — Consult Note (Signed)
Referring Physician: Criss Alvine    Chief Complaint: Stroke  HPI:                                                                                                                                         Patient unable to give history due to aphasia  [name] [name] is an 79 y.o. female who lives alone.  LSN is unknown. Daughter called patient and she was not answering phone thus 911 was called. EMS arrived at scene and house was opened by landlord.  Patient was found sitting at the table and not following commands. Patient was brought to ED.  Currently she is in ED exhibiting receptive>expressive aphasia.   Per chartt she is on ASA and Lipitor  Date last known well: Unable to determine Time last known well: Unable to determine tPA Given: No: No LSN  Past Medical History  Diagnosis Date  . DM (diabetes mellitus)   . Paroxysmal atrial flutter   . Dyslipidemia   . CAD (coronary artery disease)     a. s/p prior PCI RCA with ISR req CBA;  b. 04/2013 Neg MV;  c. 05/2013 Cath: LM 20, LAD 30p, LCX 20p, OM1 90 small, RCA 84m, PDA 40-->Med Rx.  . Osteoarthritis   . Hypothyroidism     hx  . Rheumatoid arthritis(714.0)   . Obesity   . GERD (gastroesophageal reflux disease)   . Depression   . Shoulder fracture, left     Dr. Martha Clan    Past Surgical History  Procedure Laterality Date  . Rca stent placement  2000  . Restenosis with ptca placement  2005  . Cataract surgery    . Partial hysterectomy    . Replacement total knee  2013    right  . Shoulder surgery  02/04/13  . Cardiac catheterization  06/11/2013  . Coronary angioplasty      Family History  Problem Relation Age of Onset  . Esophageal cancer Mother   . Diabetes Mother   . Cancer Mother     esophageal    Social History:  reports that she has never smoked. She has never used smokeless tobacco. She reports that she does not drink alcohol or use illicit drugs.  Allergies:  Allergies  Allergen Reactions  . Oxycodone Nausea  And Vomiting    Medications:  No current facility-administered medications for this encounter.   Current Outpatient Prescriptions  Medication Sig Dispense Refill  . albuterol (PROVENTIL HFA;VENTOLIN HFA) 108 (90 BASE) MCG/ACT inhaler Inhale 2 puffs into the lungs every 6 (six) hours as needed for wheezing or shortness of breath.  1 Inhaler  0  . amLODipine (NORVASC) 10 MG tablet Take 1 tablet (10 mg total) by mouth daily.  90 tablet  2  . aspirin 81 MG tablet Take by mouth daily.       Marland Kitchen atorvastatin (LIPITOR) 20 MG tablet Take 1 tablet (20 mg total) by mouth daily.  30 tablet  3  . ciprofloxacin (CIPRO) 500 MG tablet       . cyclobenzaprine (FLEXERIL) 5 MG tablet Take 1 tablet (5 mg total) by mouth 3 (three) times daily as needed.  30 tablet  0  . ferrous sulfate 325 (65 FE) MG tablet Take 325 mg by mouth daily with breakfast.      . fluticasone (FLONASE) 50 MCG/ACT nasal spray Place 2 sprays into the nose daily.  16 g  3  . furosemide (LASIX) 40 MG tablet Take 1 tablet (40 mg total) by mouth 2 (two) times daily.  180 tablet  3  . gabapentin (NEURONTIN) 300 MG capsule Take 1 capsule (300 mg total) by mouth 3 (three) times daily.  270 capsule  3  . insulin glargine (LANTUS) 100 UNIT/ML injection Inject 0.5 mLs (50 Units total) into the skin at bedtime.  30 mL  3  . insulin regular (NOVOLIN R RELION) 100 units/mL injection INJECT  SUBCUTANEOUSLY 4  UNITS  IF  BLOOD  SUGAR  IS  GREATER  THAN  250  10 mL  6  . isosorbide mononitrate (ISMO,MONOKET) 10 MG tablet Take 1 tablet (10 mg total) by mouth 2 (two) times daily.  180 tablet  3  . latanoprost (XALATAN) 0.005 % ophthalmic solution Place 1 drop into both eyes at bedtime.       Marland Kitchen levothyroxine (LEVOTHROID) 150 MCG tablet Take 1 tablet (150 mcg total) by mouth daily.  90 tablet  3  . lisinopril (PRINIVIL,ZESTRIL) 10 MG tablet  Take 1 tablet (10 mg total) by mouth daily.  90 tablet  1  . meclizine (ANTIVERT) 25 MG tablet Take 1 tablet (25 mg total) by mouth 2 (two) times daily. As needed for dizziness or nausea  60 tablet  3  . metFORMIN (GLUCOPHAGE) 500 MG tablet Take 1 tablet (500 mg total) by mouth 2 (two) times daily.  180 tablet  3  . metoprolol tartrate (LOPRESSOR) 25 MG tablet Take 1 tablet (25 mg total) by mouth 2 (two) times daily as needed.  180 tablet  3  . nitroGLYCERIN (NITROSTAT) 0.4 MG SL tablet Place 1 tablet (0.4 mg total) under the tongue every 5 (five) minutes as needed.  25 tablet  3  . nystatin cream (MYCOSTATIN) Apply topically 2 (two) times daily.  30 g  0  . nystatin-triamcinolone (MYCOLOG II) cream Apply 1 application topically 2 (two) times daily as needed.  30 g  1  . omeprazole (PRILOSEC) 20 MG capsule Take 1 capsule (20 mg total) by mouth 2 (two) times daily.  180 capsule  3  . potassium chloride SA (K-DUR,KLOR-CON) 20 MEQ tablet Take 1 tablet (20 mEq total) by mouth 2 (two) times daily.  180 tablet  3  . traMADol (ULTRAM) 50 MG tablet Take 2 tablets (100 mg total) by mouth every 8 (eight) hours as needed.  180 tablet  3     ROS:                                                                                                                                       History obtained from unobtainable from patient due to language barrier  Neurologic Examination:                                                                                                      Blood pressure 147/60, pulse 65, temperature 99.4 F (37.4 C), temperature source Rectal, resp. rate 16, SpO2 96.00%.  Mental Status: Alert, shows both expressive and receptive aphasia. She is unable to name objects, follow verbal commands but is able to repeat phrases and words. She is able to visually follow commands.  Cranial Nerves: II: Discs flat bilaterally; Visual fields grossly normal, pupils equal, round, reactive to light and  accommodation III,IV, VI: ptosis not present, extra-ocular motions intact bilaterally V,VII: smile symmetric with right NL fold decrease at rest, facial light touch sensation normal bilaterally  However difficult to assess due to aphasia VIII: hearing normal bilaterally IX,X: gag reflex present XI: bilateral shoulder shrug XII: midline tongue extension without atrophy or fasciculations  Motor: Right : Upper extremity   4/5    Left:     Upper extremity   5/5  Lower extremity   5/5     Lower extremity   4-/5 (due to back pain) Tone and bulk:normal tone throughout; no atrophy noted Sensory: Pinprick and light touch intact throughout, bilaterally Deep Tendon Reflexes:  Right: Upper Extremity   Left: Upper extremity   biceps (C-5 to C-6) 2/4   biceps (C-5 to C-6) 2/4 tricep (C7) 2/4    triceps (C7) 2/4 Brachioradialis (C6) 2/4  Brachioradialis (C6) 2/4  Lower Extremity Lower Extremity  quadriceps (L-2 to L-4) 1/4   quadriceps (L-2 to L-4) 1/4 Achilles (S1) 0/4   Achilles (S1) 0/4  Plantars: Right: downgoing   Left: downgoing Cerebellar: normal finger-to-nose,  normal heel-to-shin test with right leg unable to do with left leg due to back pain Gait: not tested.  CV: pulses palpable throughout    Lab Results: Basic Metabolic Panel:  Recent Labs Lab 10/21/13 1403  NA 141  K 4.2  CL 102  CO2 24  GLUCOSE 335*  BUN 18  CREATININE 0.85  CALCIUM 9.5    Liver Function Tests:  Recent Labs Lab 10/21/13 1403  AST 14  ALT 7  ALKPHOS 102  BILITOT 1.1  PROT 7.3  ALBUMIN 3.4*   No results found for this basename: LIPASE, AMYLASE,  in the last 168 hours No results found for this basename: AMMONIA,  in the last 168 hours  CBC:  Recent Labs Lab 10/21/13 1403  WBC 10.7*  NEUTROABS 7.7  HGB 11.9*  HCT 36.0  MCV 87.6  PLT 284    Cardiac Enzymes: No results found for this basename: CKTOTAL, CKMB, CKMBINDEX, TROPONINI,  in the last 168 hours  Lipid Panel: No results  found for this basename: CHOL, TRIG, HDL, CHOLHDL, VLDL, LDLCALC,  in the last 168 hours  CBG: No results found for this basename: GLUCAP,  in the last 168 hours  Microbiology: Results for orders placed in visit on 10/06/13  CULTURE, URINE COMPREHENSIVE     Status: None   Collection Time    10/06/13  2:03 PM      Result Value Ref Range Status   Culture CITROBACTER KOSERI   Final   Colony Count >=100,000 COLONIES/ML   Final   Organism ID, Bacteria CITROBACTER KOSERI   Final    Coagulation Studies: No results found for this basename: LABPROT, INR,  in the last 72 hours  Imaging: Ct Head Wo Contrast  10/21/2013   CLINICAL DATA:  79 year old female with altered mental status.  EXAM: CT HEAD WITHOUT CONTRAST  TECHNIQUE: Contiguous axial images were obtained from the base of the skull through the vertex without intravenous contrast.  COMPARISON:  02/06/2009  FINDINGS: An infarct involving the posterior left frontal lobe is identified without hemorrhage - acute to subacute.  Mild chronic small-vessel white matter ischemic changes are present.  A remote lacunar infarct within the left thalamus is present.  There is no evidence of hydrocephalus, midline shift or extra-axial collection.  The bony calvarium is unremarkable.  IMPRESSION: Acute to subacute posterior left frontal lobe infarct without hemorrhage.  Chronic small-vessel white matter ischemic changes and remote left thalamic lacunar infarct.   Electronically Signed   By: Laveda Abbe M.D.   On: 10/21/2013 14:32    Felicie Morn PA-C [facility] [phone]  10/21/2013, 3:17 PM  Patient seen and examined.  Clinical course and management discussed.  Necessary edits performed.  I agree with the above.  Assessment and plan of care developed and discussed below.     Assessment: 79 y.o. female who presents with expressive and receptive aphasia.  Otherwise only mild right sided findings on neurological examination.  Head CT reviewed and  shows a hypodensity in the left frontal lobe consistent with an acute/subacute infarct. tPA was not given due to unknown LKW.  Further work up recommended.  Stroke Risk Factors - diabetes mellitus, CAD and hyperlipidemia   Recommendations: 1. HgbA1c, fasting lipid panel 2. MRI, MRA  of the brain without contrast 3. PT consult, OT consult, Speech consult 4. Echocardiogram 5. Carotid dopplers 6. Prophylactic therapy-Antiplatelet med: Aspirin - dose 325mg  daily po or 300mg  rectally 7. Risk factor modification 8. Telemetry monitoring 9. Frequent neuro checks    , MD [facility] [phone]  10/21/2013  3:53 PM

## 2013-10-21 NOTE — ED Provider Notes (Signed)
CSN: [id number]     Arrival date & time 10/21/13  1328 History   First MD Initiated Contact with Patient 10/21/13 1336     Chief Complaint  Patient presents with  . Altered Mental Status     (Consider location/radiation/quality/duration/timing/severity/associated sxs/prior Treatment) HPI Comments: The patient is a 4 her old female past medical history of diabetes, CAD, [place] who organism, brought in by EMS for altered mental status. Per EMS report the patient was last known normal time was last night on the phone with her daughter. The patient daughter called 911 when she answer the phone the morning. Patient was found sitting at the kitchen table and confused. A little 5 caveat applies due to altered mental status.  Patient is a 79 y.o. female presenting with altered mental status. The history is provided by the patient and the EMS personnel. No language interpreter was used.  Altered Mental Status   Past Medical History  Diagnosis Date  . DM (diabetes mellitus)   . Paroxysmal atrial flutter   . Dyslipidemia   . CAD (coronary artery disease)     a. s/p prior PCI RCA with ISR req CBA;  b. 04/2013 Neg MV;  c. 05/2013 Cath: LM 20, LAD 30p, LCX 20p, OM1 90 small, RCA 37m, PDA 40-->Med Rx.  . Osteoarthritis   . Hypothyroidism     hx  . Rheumatoid arthritis(714.0)   . Obesity   . GERD (gastroesophageal reflux disease)   . Depression   . Shoulder fracture, left     Dr. Martha Clan   Past Surgical History  Procedure Laterality Date  . Rca stent placement  2000  . Restenosis with ptca placement  2005  . Cataract surgery    . Partial hysterectomy    . Replacement total knee  2013    right  . Shoulder surgery  02/04/13  . Cardiac catheterization  06/11/2013  . Coronary angioplasty     Family History  Problem Relation Age of Onset  . Esophageal cancer Mother   . Diabetes Mother   . Cancer Mother     esophageal    History  Substance Use Topics  . Smoking status: Never  Smoker   . Smokeless tobacco: Never Used     Comment: former passive smoker  . Alcohol Use: No   OB History   Grav Para Term Preterm Abortions TAB SAB Ect Mult Living                 Review of Systems  Unable to perform ROS: Mental status change      Allergies  Oxycodone  Home Medications   Prior to Admission medications   Medication Sig Start Date End Date Taking? Authorizing Provider  albuterol (PROVENTIL HFA;VENTOLIN HFA) 108 (90 BASE) MCG/ACT inhaler Inhale 2 puffs into the lungs every 6 (six) hours as needed for wheezing or shortness of breath. 06/28/13   Joaquim Nam, MD  amLODipine (NORVASC) 10 MG tablet Take 1 tablet (10 mg total) by mouth daily. 07/01/13   Shelia Media, MD  aspirin 81 MG tablet Take by mouth daily.     Historical Provider, MD  atorvastatin (LIPITOR) 20 MG tablet Take 1 tablet (20 mg total) by mouth daily. 07/02/13   Shelia Media, MD  cyclobenzaprine (FLEXERIL) 5 MG tablet Take 1 tablet (5 mg total) by mouth 3 (three) times daily as needed. 08/06/13   Shelia Media, MD  ferrous sulfate 325 (65 FE) MG tablet Take 325  mg by mouth daily with breakfast.    Historical Provider, MD  fluticasone (FLONASE) 50 MCG/ACT nasal spray Place 2 sprays into the nose daily. 06/12/12   Shelia Media, MD  furosemide (LASIX) 40 MG tablet Take 1 tablet (40 mg total) by mouth 2 (two) times daily. 07/01/13   Shelia Media, MD  gabapentin (NEURONTIN) 300 MG capsule Take 1 capsule (300 mg total) by mouth 3 (three) times daily. 08/04/13   Shelia Media, MD  insulin glargine (LANTUS) 100 UNIT/ML injection Inject 0.5 mLs (50 Units total) into the skin at bedtime. 07/01/13 08/26/15  Shelia Media, MD  insulin regular (NOVOLIN R RELION) 100 units/mL injection INJECT  SUBCUTANEOUSLY 4  UNITS  IF  BLOOD  SUGAR  IS  GREATER  THAN  250 09/03/13   Shelia Media, MD  isosorbide mononitrate (ISMO,MONOKET) 10 MG tablet Take 1 tablet (10 mg total) by mouth 2 (two) times  daily. 09/21/12   Shelia Media, MD  latanoprost (XALATAN) 0.005 % ophthalmic solution Place 1 drop into both eyes at bedtime.  04/06/12   Historical Provider, MD  levothyroxine (LEVOTHROID) 150 MCG tablet Take 1 tablet (150 mcg total) by mouth daily. 07/01/13 07/01/14  Shelia Media, MD  lisinopril (PRINIVIL,ZESTRIL) 10 MG tablet Take 1 tablet (10 mg total) by mouth daily. 09/27/13   Shelia Media, MD  meclizine (ANTIVERT) 25 MG tablet Take 1 tablet (25 mg total) by mouth 2 (two) times daily. As needed for dizziness or nausea 10/19/13   Ok Anis, NP  metFORMIN (GLUCOPHAGE) 500 MG tablet Take 1 tablet (500 mg total) by mouth 2 (two) times daily. 07/01/13   Shelia Media, MD  metoprolol tartrate (LOPRESSOR) 25 MG tablet Take 1 tablet (25 mg total) by mouth 2 (two) times daily as needed. 07/01/13 07/01/14  Shelia Media, MD  nitroGLYCERIN (NITROSTAT) 0.4 MG SL tablet Place 1 tablet (0.4 mg total) under the tongue every 5 (five) minutes as needed. 06/16/13   Raquel Rey, NP  nystatin cream (MYCOSTATIN) Apply topically 2 (two) times daily. 09/21/12   Shelia Media, MD  nystatin-triamcinolone (MYCOLOG II) cream Apply 1 application topically 2 (two) times daily as needed. 03/05/12   Shelia Media, MD  omeprazole (PRILOSEC) 20 MG capsule Take 1 capsule (20 mg total) by mouth 2 (two) times daily. 06/15/12 10/19/13  Shelia Media, MD  potassium chloride SA (K-DUR,KLOR-CON) 20 MEQ tablet Take 1 tablet (20 mEq total) by mouth 2 (two) times daily. 06/12/12   Shelia Media, MD  traMADol (ULTRAM) 50 MG tablet Take 2 tablets (100 mg total) by mouth every 8 (eight) hours as needed. 09/03/13   Shelia Media, MD   There were no vitals taken for this visit. Physical Exam  Nursing note and vitals reviewed. Constitutional: She appears well-developed and well-nourished.  Non-toxic appearance. She does not have a sickly appearance. She does not appear ill. No distress.  HENT:  Head:  Normocephalic and atraumatic.  Right Ear: External ear normal.  Left Ear: External ear normal.  Mouth/Throat: Uvula is midline. Mucous membranes are dry.  Eyes: EOM are normal. Pupils are equal, round, and reactive to light. Right eye exhibits no discharge. Left eye exhibits no discharge.  Neck: Neck supple.  Cardiovascular: Normal rate and regular rhythm.   Pulmonary/Chest: Effort normal. Not tachypneic. No respiratory distress. She has no decreased breath sounds. She has no wheezes. She has no rhonchi. She has no rales.  Abdominal: Soft. There is no tenderness. There is no rebound.  Neurological: She is alert. She is disoriented. GCS eye subscore is 4. GCS verbal subscore is 4. GCS motor subscore is 6.  Patient with expressive aphasia. States "okay okay okay" when asked name. Unable to answer time and place. Speech is clear. No obvious facial droop on exam, patient unable to perform facial motor tests, normal sensation to light touch on the face. Normal strength in upper and lower extremities bilaterally, strong and equal grip strength. Abnormal sensation to light touch left lower extremity.  Normal finger to nose and rapid alternating movements No pronator drift  Skin: Skin is warm and dry. She is not diaphoretic.  Psychiatric: She has a normal mood and affect. Her behavior is normal.    ED Course  Procedures (including critical care time) Labs Review Results for orders placed during the hospital encounter of 10/21/13  CBC WITH DIFFERENTIAL      Result Value Ref Range   WBC 10.7 (*) 4.0 - 10.5 K/uL   RBC 4.11  3.87 - 5.11 MIL/uL   Hemoglobin 11.9 (*) 12.0 - 15.0 g/dL   HCT 13.2  44.0 - 10.2 %   MCV 87.6  78.0 - 100.0 fL   MCH 29.0  26.0 - 34.0 pg   MCHC 33.1  30.0 - 36.0 g/dL   RDW 72.5  36.6 - 44.0 %   Platelets 284  150 - 400 K/uL   Neutrophils Relative % 72  43 - 77 %   Neutro Abs 7.7  1.7 - 7.7 K/uL   Lymphocytes Relative 19  12 - 46 %   Lymphs Abs 2.0  0.7 - 4.0 K/uL    Monocytes Relative 9  3 - 12 %   Monocytes Absolute 1.0  0.1 - 1.0 K/uL   Eosinophils Relative 0  0 - 5 %   Eosinophils Absolute 0.0  0.0 - 0.7 K/uL   Basophils Relative 0  0 - 1 %   Basophils Absolute 0.0  0.0 - 0.1 K/uL  COMPREHENSIVE METABOLIC PANEL      Result Value Ref Range   Sodium 141  137 - 147 mEq/L   Potassium 4.2  3.7 - 5.3 mEq/L   Chloride 102  96 - 112 mEq/L   CO2 24  19 - 32 mEq/L   Glucose, Bld 335 (*) 70 - 99 mg/dL   BUN 18  6 - 23 mg/dL   Creatinine, Ser 3.47  0.50 - 1.10 mg/dL   Calcium 9.5  8.4 - 42.5 mg/dL   Total Protein 7.3  6.0 - 8.3 g/dL   Albumin 3.4 (*) 3.5 - 5.2 g/dL   AST 14  0 - 37 U/L   ALT 7  0 - 35 U/L   Alkaline Phosphatase 102  39 - 117 U/L   Total Bilirubin 1.1  0.3 - 1.2 mg/dL   GFR calc non Af Amer 65 (*) >90 mL/min   GFR calc Af Amer 75 (*) >90 mL/min  URINALYSIS, ROUTINE W REFLEX MICROSCOPIC      Result Value Ref Range   Color, Urine YELLOW  YELLOW   APPearance CLEAR  CLEAR   Specific Gravity, Urine 1.019  1.005 - 1.030   pH 5.5  5.0 - 8.0   Glucose, UA 100 (*) NEGATIVE mg/dL   Hgb urine dipstick SMALL (*) NEGATIVE   Bilirubin Urine NEGATIVE  NEGATIVE   Ketones, ur 15 (*) NEGATIVE mg/dL   Protein, ur >956 (*) NEGATIVE  mg/dL   Urobilinogen, UA 0.2  0.0 - 1.0 mg/dL   Nitrite NEGATIVE  NEGATIVE   Leukocytes, UA NEGATIVE  NEGATIVE  ETHANOL      Result Value Ref Range   Alcohol, Ethyl (B) <11  0 - 11 mg/dL  URINE RAPID DRUG SCREEN (HOSP PERFORMED)      Result Value Ref Range   Opiates NONE DETECTED  NONE DETECTED   Cocaine NONE DETECTED  NONE DETECTED   Benzodiazepines NONE DETECTED  NONE DETECTED   Amphetamines NONE DETECTED  NONE DETECTED   Tetrahydrocannabinol NONE DETECTED  NONE DETECTED   Barbiturates NONE DETECTED  NONE DETECTED  PROTIME-INR      Result Value Ref Range   Prothrombin Time 13.5  11.6 - 15.2 seconds   INR 1.05  0.00 - 1.49  APTT      Result Value Ref Range   aPTT 25  24 - 37 seconds  URINE MICROSCOPIC-ADD  ON      Result Value Ref Range   Squamous Epithelial / LPF RARE  RARE   WBC, UA 0-2  <3 WBC/hpf   RBC / HPF 0-2  <3 RBC/hpf   Bacteria, UA RARE  RARE   Casts HYALINE CASTS (*) NEGATIVE   Urine-Other MUCOUS PRESENT    I-STAT TROPOININ, ED      Result Value Ref Range   Troponin i, poc 0.04  0.00 - 0.08 ng/mL   Comment 3            Ct Head Wo Contrast  10/21/2013   CLINICAL DATA:  79 year old female with altered mental status.  EXAM: CT HEAD WITHOUT CONTRAST  TECHNIQUE: Contiguous axial images were obtained from the base of the skull through the vertex without intravenous contrast.  COMPARISON:  02/06/2009  FINDINGS: An infarct involving the posterior left frontal lobe is identified without hemorrhage - acute to subacute.  Mild chronic small-vessel white matter ischemic changes are present.  A remote lacunar infarct within the left thalamus is present.  There is no evidence of hydrocephalus, midline shift or extra-axial collection.  The bony calvarium is unremarkable.  IMPRESSION: Acute to subacute posterior left frontal lobe infarct without hemorrhage.  Chronic small-vessel white matter ischemic changes and remote left thalamic lacunar infarct.   Electronically Signed   By: Laveda Abbe M.D.   On: 10/21/2013 14:32    EKG Interpretation   Date/Time:  Thursday October 21 2013 13:35:03 EDT Ventricular Rate:  68 PR Interval:  73 QRS Duration: 96 QT Interval:  441 QTC Calculation: 469 R Axis:   1 Text Interpretation:  Sinus rhythm Short PR interval no significant change  since 2010 Confirmed by GOLDSTON  MD, SCOTT ([id number]) on 10/21/2013 2:39:32 PM      MDM   Final diagnoses:  CVA (cerebral infarction)  Hyperglycemia   She presents from home, well out of the TPA window, last known normal time yesterday afternoon-evening on the phone. Since today with expressive aphasia. Exam concerning for CVA. Labs and CT head ordered. Discussed patient history, condition and current evaluation with Dr.  Criss Alvine. CT shows  acute to subacute posterior left frontal lobe infarct without hemorrhage.  Dr. Criss Alvine discuss CT findings with Dr. Thad Ranger, neurologist, who agrees to consult during the patient's admission. Dr. Criss Alvine evaluate patient during this encounter. He spoke with the patient's daughter on the phone and discussed CT results. The patient's daughter confirms last known normal yesterday afternoon/evening. Discussed patient history, workup with Dr. Vanessa Barbara who agrees to admit the  patient.   Meds given in ED:  Medications  sodium chloride 0.9 % bolus 500 mL (500 mLs Intravenous New Bag/Given 10/21/13 1458)    New Prescriptions   No medications on file        Clabe Seal, PA-C 10/21/13 1551

## 2013-10-21 NOTE — H&P (Signed)
[facility] History and Physical  [name] [name] IHK:[id number] DOB: November 23, 1934 DOA: 10/21/2013  Referring physician:  PCP: [name], MD   Chief Complaint: Expressive Aphasia  HPI: [name] [name] is a 79 y.o. female with a past medical history of type 2 diabetes mellitus, hypertension, paroxysmal atrial flutter, coronary artery disease, who was brought to the emergency room today by EMS. Patient unable to provide history and do to expressive aphasia. History obtained from emergency room staff and medical records. It is unknown when the patient was last seen in her usual state health. Her daughter called her mother today and found that she was not answering telephone, dry concerned called 911. EMS found her to be sitting at the table, unable to get words out, not following commands, and brought her to the emergency room. She was seen and evaluated by neurology in the emergency department. Was not felt to be candidate for TPA. CT scan of brain without contrast showed an acute to subacute posterior left frontal lobe infarct without hemorrhage.                                                                                                                                                                                                                                                             Review of Systems:  Unable to obtain due to expressive aphasia  Past Medical History  Diagnosis Date  . DM (diabetes mellitus)   . Paroxysmal atrial flutter   . Dyslipidemia   . CAD (coronary artery disease)     a. s/p prior PCI RCA with ISR req CBA;  b. 04/2013 Neg MV;  c. 05/2013 Cath: LM 20, LAD 30p, LCX 20p, OM1 90 small, RCA 7m, PDA 40-->Med Rx.  . Osteoarthritis   . Hypothyroidism     hx  . Rheumatoid arthritis(714.0)   . Obesity   . GERD (gastroesophageal reflux disease)   . Depression   . Shoulder fracture, left     Dr. Martha Clan   Past Surgical History  Procedure  Laterality Date  . Rca stent placement  2000  . Restenosis with ptca placement  2005  . Cataract surgery    . Partial hysterectomy    . Replacement total knee  2013    right  . Shoulder surgery  02/04/13  . Cardiac catheterization  06/11/2013  . Coronary angioplasty     Social History:  reports that she has never smoked. She has never used smokeless tobacco. She reports that she does not drink alcohol or use illicit drugs.  Allergies  Allergen Reactions  . Oxycodone Nausea And Vomiting    Family History  Problem Relation Age of Onset  . Esophageal cancer Mother   . Diabetes Mother   . Cancer Mother     esophageal      Prior to Admission medications   Medication Sig Start Date End Date Taking? Authorizing Provider  albuterol (PROVENTIL HFA;VENTOLIN HFA) 108 (90 BASE) MCG/ACT inhaler Inhale 2 puffs into the lungs every 6 (six) hours as needed for wheezing or shortness of breath. 06/28/13  Yes Joaquim Nam, MD  amLODipine (NORVASC) 10 MG tablet Take 1 tablet (10 mg total) by mouth daily. 07/01/13  Yes Shelia Media, MD  aspirin 81 MG tablet Take by mouth daily.    Yes Historical Provider, MD  atorvastatin (LIPITOR) 20 MG tablet Take 1 tablet (20 mg total) by mouth daily. 07/02/13  Yes Shelia Media, MD  cyclobenzaprine (FLEXERIL) 5 MG tablet Take 1 tablet (5 mg total) by mouth 3 (three) times daily as needed. 08/06/13  Yes Shelia Media, MD  ferrous sulfate 325 (65 FE) MG tablet Take 325 mg by mouth daily with breakfast.   Yes Historical Provider, MD  fluticasone (FLONASE) 50 MCG/ACT nasal spray Place 2 sprays into the nose daily. 06/12/12  Yes Shelia Media, MD  furosemide (LASIX) 40 MG tablet Take 1 tablet (40 mg total) by mouth 2 (two) times daily. 07/01/13  Yes Shelia Media, MD  gabapentin (NEURONTIN) 300 MG capsule Take 1 capsule (300 mg total) by mouth 3 (three) times daily. 08/04/13  Yes Shelia Media, MD  insulin glargine (LANTUS) 100 UNIT/ML injection  Inject 0.5 mLs (50 Units total) into the skin at bedtime. 07/01/13 08/26/15 Yes Shelia Media, MD  insulin regular (NOVOLIN R RELION) 100 units/mL injection INJECT  SUBCUTANEOUSLY 4  UNITS  IF  BLOOD  SUGAR  IS  GREATER  THAN  250 09/03/13  Yes Shelia Media, MD  isosorbide mononitrate (ISMO,MONOKET) 10 MG tablet Take 1 tablet (10 mg total) by mouth 2 (two) times daily. 09/21/12  Yes Shelia Media, MD  latanoprost (XALATAN) 0.005 % ophthalmic solution Place 1 drop into both eyes at bedtime.  04/06/12  Yes Historical Provider, MD  levothyroxine (LEVOTHROID) 150 MCG tablet Take 1 tablet (150 mcg total) by mouth daily. 07/01/13 07/01/14 Yes Shelia Media, MD  lisinopril (PRINIVIL,ZESTRIL) 10 MG tablet Take 1 tablet (10 mg total) by mouth daily. 09/27/13  Yes Shelia Media, MD  meclizine (ANTIVERT) 25 MG tablet Take 1 tablet (25 mg total) by mouth 2 (two) times daily. As needed for dizziness or nausea 10/19/13  Yes Ok Anis, NP  metFORMIN (GLUCOPHAGE) 500 MG tablet Take 1 tablet (500 mg total) by mouth 2 (two) times daily. 07/01/13  Yes Shelia Media, MD  metoprolol tartrate (LOPRESSOR) 25 MG tablet Take 1 tablet (25 mg total) by mouth 2 (two) times daily as needed. 07/01/13 07/01/14 Yes Shelia Media, MD  nitroGLYCERIN (NITROSTAT) 0.4 MG SL tablet Place 1 tablet (0.4 mg total) under the tongue every 5 (five) minutes as needed. 06/16/13  Yes Raquel Rey, NP  potassium chloride SA (K-DUR,KLOR-CON) 20 MEQ tablet Take 1 tablet (20 mEq total) by  mouth 2 (two) times daily. 06/12/12  Yes Shelia MediaJennifer A Walker, MD  traMADol (ULTRAM) 50 MG tablet Take 100 mg by mouth every 8 (eight) hours as needed for moderate pain. 09/03/13  Yes Shelia MediaJennifer A Walker, MD  omeprazole (PRILOSEC) 20 MG capsule Take 1 capsule (20 mg total) by mouth 2 (two) times daily. 06/15/12 10/19/13  Shelia MediaJennifer A Walker, MD   Physical Exam: Filed Vitals:   10/21/13 1530  BP: 157/95  Pulse: 64  Temp:   Resp: 18    BP 157/95   Pulse 64  Temp(Src) 99.4 F (37.4 C) (Rectal)  Resp 18  Ht 5\' 3"  (1.6 m)  Wt 99.4 kg (219 lb 2.2 oz)  BMI 38.83 kg/m2  SpO2 96%  General:  Patient with expressive aphasia, can not provide history. She is following commands. Eyes: PERRL, normal lids, irises & conjunctiva ENT: grossly normal hearing, lips & tongue Neck: no LAD, masses or thyromegaly Cardiovascular: RRR, no m/r/g. No LE edema. Telemetry: SR, no arrhythmias  Respiratory: CTA bilaterally, no w/r/r. Normal respiratory effort. Abdomen: soft, ntnd Skin: no rash or induration seen on limited exam Musculoskeletal: grossly normal tone BUE/BLE Psychiatric: Patient with expressive aphasia Neurologic: Patient is awake, alert, can't follow commands. She continues to have expressive aphasia during my encounter. I do not note facial droop/asymmetry. No facial weakness or tongue deviation. Neck was supple. She had 4-5 muscle strength to her left upper extremity, 5 of 5 muscle strength to right upper extremity. 4-5 muscle strength to left lower extremity and following, strength to right lower extremity. Sensation appears to be intact. Finger to nose intact.           Labs on Admission:  Basic Metabolic Panel:  Recent Labs Lab 10/21/13 1403  NA 141  K 4.2  CL 102  CO2 24  GLUCOSE 335*  BUN 18  CREATININE 0.85  CALCIUM 9.5   Liver Function Tests:  Recent Labs Lab 10/21/13 1403  AST 14  ALT 7  ALKPHOS 102  BILITOT 1.1  PROT 7.3  ALBUMIN 3.4*   No results found for this basename: LIPASE, AMYLASE,  in the last 168 hours No results found for this basename: AMMONIA,  in the last 168 hours CBC:  Recent Labs Lab 10/21/13 1403  WBC 10.7*  NEUTROABS 7.7  HGB 11.9*  HCT 36.0  MCV 87.6  PLT 284   Cardiac Enzymes: No results found for this basename: CKTOTAL, CKMB, CKMBINDEX, TROPONINI,  in the last 168 hours  BNP (last 3 results) No results found for this basename: PROBNP,  in the last 8760 hours CBG: No results  found for this basename: GLUCAP,  in the last 168 hours  Radiological Exams on Admission: Ct Head Wo Contrast  10/21/2013   CLINICAL DATA:  79 year old female with altered mental status.  EXAM: CT HEAD WITHOUT CONTRAST  TECHNIQUE: Contiguous axial images were obtained from the base of the skull through the vertex without intravenous contrast.  COMPARISON:  02/06/2009  FINDINGS: An infarct involving the posterior left frontal lobe is identified without hemorrhage - acute to subacute.  Mild chronic small-vessel white matter ischemic changes are present.  A remote lacunar infarct within the left thalamus is present.  There is no evidence of hydrocephalus, midline shift or extra-axial collection.  The bony calvarium is unremarkable.  IMPRESSION: Acute to subacute posterior left frontal lobe infarct without hemorrhage.  Chronic small-vessel white matter ischemic changes and remote left thalamic lacunar infarct.   Electronically Signed   By:  Laveda Abbe M.D.   On: 10/21/2013 14:32    EKG: Independently reviewed. Sinus rhythm  Assessment/Plan Principal Problem:   CVA (cerebral infarction) Active Problems:   CAD (coronary artery disease)   Atrial flutter   Hypothyroidism   Hypertension   Poorly controlled type 2 diabetes mellitus with circulatory disorder   1. CVA. Patient with multiple cardiac vascular risk factors including history of coronary artery disease, found with expressive aphasia today at her apartment. CT scan of brain without contrast performed on admission showing an acute to subacute posterior left frontal lobe infarct without hemorrhage. She was seen and evaluated by Dr. Thad Ranger in the emergency room. Dr. Thad Ranger recommending stroke protocol with MRA/MRI, transthoracic echocardiogram, carotid Dopplers, and antiplatelet therapy with aspirin. She passed bedside swallow evauation will advance diet. Admit her to telemetry, perform frequent neuro checks, followup on imaging studies. 2. Type 2  diabetes mellitus, poorly controlled. Having hemoglobin A1c of 8.3 on 10/06/2013. She presents with blood sugars in the 300 range. She takes insulin Lantus 50 units subcutaneous each bedtime, accu-checks qac qhs.  3. Hypertension. Patient systolic blood pressures in the 140s to 150s. Will hold off on antihypertensive agents tonight for permissive hypertension to favor cerebral perfusion in setting of acute CVA. 4. History of atrial flutter. Per medication reconciliation she was not on anticoagulation. Neurology recommending antiplatelet therapy with aspirin for now. 5. History of coronary artery disease. There is no reports of chest pain, initial troponin was within normal limits. 6. Hypothyroidism. Most recent TSH of 0.72 on 10/06/2013, continue current dose of Synthroid 7. DVT prophylaxis. Lovenox    Code Status: Full Code. Family unavailable to discuss code status Family Communication: Family unavailable Disposition Plan: Will admit patient to tele, anticipate will require greater than 2 nights hospitalization  Time spent: 70 min  Jeralyn Bennett [facility] Pager [phone]  **Disclaimer: This note may have been dictated with voice recognition software. Similar sounding words can inadvertently be transcribed and this note may contain transcription errors which may not have been corrected upon publication of note.**

## 2013-10-22 ENCOUNTER — Inpatient Hospital Stay (HOSPITAL_COMMUNITY): Payer: Medicare HMO

## 2013-10-22 DIAGNOSIS — I6350 Cerebral infarction due to unspecified occlusion or stenosis of unspecified cerebral artery: Secondary | ICD-10-CM | POA: Diagnosis not present

## 2013-10-22 DIAGNOSIS — I059 Rheumatic mitral valve disease, unspecified: Secondary | ICD-10-CM

## 2013-10-22 LAB — BASIC METABOLIC PANEL
BUN: 16 mg/dL (ref 6–23)
CO2: 22 mEq/L (ref 19–32)
Calcium: 8.8 mg/dL (ref 8.4–10.5)
Chloride: 102 mEq/L (ref 96–112)
Creatinine, Ser: 0.81 mg/dL (ref 0.50–1.10)
GFR, EST AFRICAN AMERICAN: 80 mL/min — AB (ref >90)
GFR, EST NON AFRICAN AMERICAN: 69 mL/min — AB (ref >90)
Glucose, Bld: 147 mg/dL — ABNORMAL HIGH (ref 70–99)
POTASSIUM: 3.7 meq/L (ref 3.7–5.3)
SODIUM: 140 meq/L (ref 137–147)

## 2013-10-22 LAB — LIPID PANEL
CHOLESTEROL: 131 mg/dL (ref 0–200)
HDL: 36 mg/dL — ABNORMAL LOW (ref >39)
LDL CALC: 67 mg/dL (ref 0–99)
Total CHOL/HDL Ratio: 3.6 RATIO
Triglycerides: 142 mg/dL (ref <150)
VLDL: 28 mg/dL (ref 0–40)

## 2013-10-22 LAB — HEMOGLOBIN A1C
Hgb A1c MFr Bld: 7.7 % — ABNORMAL HIGH (ref <5.7)
Mean Plasma Glucose: 174 mg/dL — ABNORMAL HIGH (ref <117)

## 2013-10-22 LAB — GLUCOSE, CAPILLARY
GLUCOSE-CAPILLARY: 181 mg/dL — AB (ref 70–99)
Glucose-Capillary: 178 mg/dL — ABNORMAL HIGH (ref 70–99)
Glucose-Capillary: 175 mg/dL — ABNORMAL HIGH (ref 70–99)
Glucose-Capillary: 164 mg/dL — ABNORMAL HIGH (ref 70–99)
Glucose-Capillary: 252 mg/dL — ABNORMAL HIGH (ref 70–99)

## 2013-10-22 MED ORDER — APIXABAN 5 MG PO TABS
5.0000 mg | ORAL_TABLET | Freq: Two times a day (BID) | ORAL | Status: DC
  Administered 2013-10-22 – 2013-10-27 (×10): 5 mg via ORAL
  Filled 2013-10-22 (×11): qty 1

## 2013-10-22 NOTE — ED Provider Notes (Signed)
Medical screening examination/treatment/procedure(s) were conducted as a shared visit with non-physician practitioner(s) and myself.  I personally evaluated the patient during the encounter.   EKG Interpretation   Date/Time:  Thursday October 21 2013 13:35:03 EDT Ventricular Rate:  68 PR Interval:  73 QRS Duration: 96 QT Interval:  441 QTC Calculation: 469 R Axis:   1 Text Interpretation:  Sinus rhythm Short PR interval no significant change  since 2010 Confirmed by Valon Glasscock  MD, Teddie Mehta ([id number]) on 10/21/2013 2:39:32 PM       Patient last talked to the day prior to arrival and was supposedly normal. Now with some confusion and speaking difficulty. CT concerning for stroke, clearly out of intervention window. I discussed these results with patient and her daughter over the phone. Neurology consulted, will admit to medicine.  Audree Camel, MD 10/22/13 [phone]

## 2013-10-22 NOTE — Evaluation (Signed)
Physical Therapy Evaluation Patient Details Name: [name] [name] MRN: [id number] DOB: 21-Feb-1935 Today's Date: 10/22/2013   History of Present Illness  79 year old female admitted 10/21/13 due to expressive aphasia. PMH significant for DM2, HTN, CAD. CT revealed acute-subacute posterior left frontal infarct and remote left thalamic lacunar infarct.   Clinical Impression  Pt adm from home due to the above. Pt presents with decreased independence with gt due to generalized weakness and balance deficits. Difficulty assessing cognition and PLOF due to expressive aphasia. Pt does report she has a grand daughter for support. Pt to benefit from skilled acute PT to maximize functional mobility. At this time will recommend CIR for pt to return to independent level of mobility and function prior to D/C home.     Follow Up Recommendations CIR    Equipment Recommendations  None recommended by PT    Recommendations for Other Services OT consult;Rehab consult     Precautions / Restrictions Precautions Precautions: Fall Restrictions Weight Bearing Restrictions: No      Mobility  Bed Mobility Overal bed mobility: Needs Assistance Bed Mobility: Sit to Supine       Sit to supine: Min assist   General bed mobility comments: (A) to control trunk to proper supine position; cues for sequencing; incr time due to weakness   Transfers Overall transfer level: Needs assistance Equipment used: Rolling walker (2 wheeled) Transfers: Sit to/from Stand Sit to Stand: Min assist         General transfer comment: (A) to achieve upright standing position from chair; cues for hand placement and safety with RW; pt demo generalized weakness in LEs with sit to stand transition   Ambulation/Gait Ambulation/Gait assistance: Min assist Ambulation Distance (Feet): 18 Feet Assistive device: Rolling walker (2 wheeled) Gait Pattern/deviations: Step-through pattern;Wide base of support;Trunk flexed;Decreased  stride length Gait velocity: decreased Gait velocity interpretation: Below normal speed for age/gender General Gait Details: pt with short shuffled  unsteady gt; difficulty managing RW around obstacles; cues for safety and min (A) to manage RW   Stairs            Wheelchair Mobility    Modified Rankin (Stroke Patients Only) Modified Rankin (Stroke Patients Only) Pre-Morbid Rankin Score: No symptoms Modified Rankin: Moderately severe disability     Balance Overall balance assessment: Needs assistance Sitting-balance support: Feet supported;No upper extremity supported Sitting balance-Leahy Scale: Fair     Standing balance support: During functional activity;Bilateral upper extremity supported Standing balance-Leahy Scale: Poor Standing balance comment: pt with sway and relies heavily on UE support for balance                              Pertinent Vitals/Pain No c/o pain.     Home Living Family/patient expects to be discharged to:: Private residence Living Arrangements: Alone Available Help at Discharge: Available PRN/intermittently;Family Type of Home: House Home Access: Level entry       Home Equipment: None      Prior Function Level of Independence: Independent         Comments: pt reports she lives alone but has grandchildren who can check on her; difficulty obtaining PLOF; no family in room      Hand Dominance        Extremity/Trunk Assessment   Upper Extremity Assessment: Defer to OT evaluation           Lower Extremity Assessment: Generalized weakness  Cervical / Trunk Assessment: Kyphotic  Communication   Communication: Expressive difficulties  Cognition Arousal/Alertness: Awake/alert Behavior During Therapy: Restless Overall Cognitive Status: Difficult to assess                      General Comments      Exercises        Assessment/Plan    PT Assessment Patient needs continued PT services  PT  Diagnosis Abnormality of gait;Generalized weakness   PT Problem List Decreased strength;Decreased activity tolerance;Decreased balance;Decreased mobility;Decreased cognition;Decreased knowledge of use of DME  PT Treatment Interventions DME instruction;Gait training;Functional mobility training;Therapeutic activities;Therapeutic exercise;Balance training;Neuromuscular re-education;Patient/family education   PT Goals (Current goals can be found in the Care Plan section) Acute Rehab PT Goals Patient Stated Goal: home  PT Goal Formulation: With patient Time For Goal Achievement: 10/29/13 Potential to Achieve Goals: Good    Frequency Min 4X/week   Barriers to discharge Decreased caregiver support lives alone     Co-evaluation               End of Session Equipment Utilized During Treatment: Gait belt Activity Tolerance: Patient tolerated treatment well Patient left: in bed;with call bell/phone within reach;with bed alarm set Nurse Communication: Mobility status;Precautions         Time: 5621-3086 PT Time Calculation (min): 18 min   Charges:   PT Evaluation $Initial PT Evaluation Tier I: 1 Procedure PT Treatments $Gait Training: 8-22 mins   PT G CodesNadara Mustard [place], [place]  [phone] 10/22/2013, 2:10 PM

## 2013-10-22 NOTE — Progress Notes (Signed)
Utilization review completed. Abbygale Lapid, RN, BSN. 

## 2013-10-22 NOTE — Progress Notes (Signed)
ANTICOAGULATION CONSULT NOTE - Initial Consult  Pharmacy Consult for Eliquis Indication: atrial fibrillation  Allergies  Allergen Reactions  . Oxycodone Nausea And Vomiting    Patient Measurements: Height: 5\' 3"  (160 cm) Weight: 219 lb 2.2 oz (99.4 kg) IBW/kg (Calculated) : 52.4  Vital Signs: Temp: 99.1 F (37.3 C) (06/05 0938) Temp src: Oral (06/05 0938) BP: 128/70 mmHg (06/05 0938) Pulse Rate: 71 (06/05 0938)  Labs:  Recent Labs  10/21/13 1403 10/21/13 1459 10/22/13 0626  HGB 11.9*  --   --   HCT 36.0  --   --   PLT 284  --   --   APTT  --  25  --   LABPROT  --  13.5  --   INR  --  1.05  --   CREATININE 0.85  --  0.81    Estimated Creatinine Clearance: 66.4 ml/min (by C-G formula based on Cr of 0.81).   Medical History: Past Medical History  Diagnosis Date  . DM (diabetes mellitus)   . Paroxysmal atrial flutter   . Dyslipidemia   . CAD (coronary artery disease)     a. s/p prior PCI RCA with ISR req CBA;  b. 04/2013 Neg MV;  c. 05/2013 Cath: LM 20, LAD 30p, LCX 20p, OM1 90 small, RCA 66m, PDA 40-->Med Rx.  . Osteoarthritis   . Hypothyroidism     hx  . Rheumatoid arthritis(714.0)   . Obesity   . GERD (gastroesophageal reflux disease)   . Depression   . Shoulder fracture, left     Dr. 74m    Medications:  Scheduled:  . atorvastatin  20 mg Oral Daily  . gabapentin  300 mg Oral TID  . insulin aspart  0-15 Units Subcutaneous TID WC  . insulin aspart  0-5 Units Subcutaneous QHS  . insulin glargine  50 Units Subcutaneous QHS  . levothyroxine  150 mcg Oral Daily    Assessment: 79 yo F with acute L MCA CVA and hx of atrial flutter.  Pt was not on anticoagulation PTA.  To start Eliquis for secondary stroke prevention.    Hgb 11.9, SCr 0.8    Goal of Therapy:  Therapeutic Anticoagulation  Monitor platelets by anticoagulation protocol: Yes   Plan:  D/C Lovenox - was on 40mg  once daily for VTE px - last dose 6/4 at 1700 Begin Eliquis 5mg  PO  BID - first dose tonight.  73, Pharm.D., BCPS Clinical Pharmacist Pager [phone] 10/22/2013 1:46 PM

## 2013-10-22 NOTE — Progress Notes (Signed)
Patient Demographics  [name] [name], is a 79 y.o. female, DOB - 1934/06/01, AST:[id number]  Admit date - 10/21/2013   Admitting Physician Jeralyn Bennett, MD  Outpatient Primary MD for the patient is [name], MD  LOS - 1   Chief Complaint  Patient presents with  . Altered Mental Status           Subjective:   [name] [name] today has, No headache, No chest pain, No abdominal pain - No Nausea, No new weakness tingling or numbness, No Cough - SOB. Still has problems processing words and understanding questions clearly.    Assessment & Plan    1. Exp Aphasia due to L MCA territory ischemic CVA with some petechial hemorrhages. She is on full dose aspirin increased from 81 mg of aspirin, continue statin LDL is less than 100, A1c is 8.3 Will adjust insulin for better control, we'll be seen by PT OT and speech, neuro following. Echogram, carotids pending. Currently in sinus rhythm but does have history of atrial flutter in the chart.     2.CAD with history of atrial flutter. Chest pain-free, on Lopressor, monitor on telemetry, will defer event monitor versus loop recorder and anticoagulation to stroke team.    3. Hypothyroidism. On Synthroid continue.    4. DM 2. A1c 8.3, on sliding scale along with Lantus. Will require A1c and glycemic monitoring and control outpatient post discharge.  Lab Results  Component Value Date   HGBA1C 8.3* 10/06/2013    CBG (last 3)   Recent Labs  10/21/13 1714 10/21/13 2139 10/22/13 0936  GLUCAP 298* 178* 181*      5. Hypertension. Continue home dose Lopressor and monitor.       Code Status: Full  Family Communication: None present  Disposition Plan: To be decided   Procedures CT head, MRI/MRA brain, echogram, carotid  duplex   Consults  Neuro   Medications  Scheduled Meds: . aspirin  300 mg Rectal Daily   Or  . aspirin  325 mg Oral Daily  . atorvastatin  20 mg Oral Daily  . enoxaparin (LOVENOX) injection  40 mg Subcutaneous Q24H  . gabapentin  300 mg Oral TID  . insulin aspart  0-15 Units Subcutaneous TID WC  . insulin aspart  0-5 Units Subcutaneous QHS  . insulin glargine  50 Units Subcutaneous QHS  . levothyroxine  150 mcg Oral Daily   Continuous Infusions:  PRN Meds:.senna-docusate, traMADol  DVT Prophylaxis  Lovenox   Lab Results  Component Value Date   PLT 284 10/21/2013    Antibiotics     Anti-infectives   None          Objective:   Filed Vitals:   10/22/13 0150 10/22/13 0333 10/22/13 0549 10/22/13 0938  BP: 157/51 158/62 172/52 128/70  Pulse: 68 68 70 71  Temp: 99.6 F (37.6 C) 98.5 F (36.9 C) 98 F (36.7 C) 99.1 F (37.3 C)  TempSrc: Oral Oral Oral Oral  Resp: 18 16 18 18   Height:      Weight:      SpO2: 92% 96% 93% 96%    Wt Readings from Last 3 Encounters:  10/21/13 99.4 kg (219 lb 2.2 oz)  10/19/13 101.379 kg (223 lb 8  oz)  10/06/13 102.286 kg (225 lb 8 oz)     Intake/Output Summary (Last 24 hours) at 10/22/13 1017 Last data filed at 10/21/13 2028  Gross per 24 hour  Intake      0 ml  Output      1 ml  Net     -1 ml     Physical Exam  Awake Alert, Oriented X 3, No new F.N deficits, does have expressive aphasia, Normal affect [place].AT,PERRAL Supple Neck,No JVD, No cervical lymphadenopathy appriciated.  Symmetrical Chest wall movement, Good air movement bilaterally, CTAB RRR,No Gallops,Rubs or new Murmurs, No Parasternal Heave +ve B.Sounds, Abd Soft, No tenderness, No organomegaly appriciated, No rebound - guarding or rigidity. No Cyanosis, Clubbing or edema, No new Rash or bruise      Data Review   Micro Results No results found for this or any previous visit (from the past 240 hour(s)).  Radiology Reports Dg Chest 2 View  10/22/2013    CLINICAL DATA:  Stroke.  EXAM: CHEST  2 VIEW  COMPARISON:  05/21/2012  FINDINGS: The cardiac silhouette remains enlarged. The lungs are slightly less well inflated than on the prior study with minimal prominence of the central pulmonary vasculature. There is no evidence of acute airspace consolidation, overt pulmonary edema, pleural effusion, or pneumothorax. Focal linear opacity in the left midlung is unchanged and may reflect scarring. No acute osseous abnormality is identified.  IMPRESSION: No active cardiopulmonary disease.   Electronically Signed   By: Sebastian Ache   On: 10/22/2013 08:29   Ct Head Wo Contrast  10/21/2013   CLINICAL DATA:  79 year old female with altered mental status.  EXAM: CT HEAD WITHOUT CONTRAST  TECHNIQUE: Contiguous axial images were obtained from the base of the skull through the vertex without intravenous contrast.  COMPARISON:  02/06/2009  FINDINGS: An infarct involving the posterior left frontal lobe is identified without hemorrhage - acute to subacute.  Mild chronic small-vessel white matter ischemic changes are present.  A remote lacunar infarct within the left thalamus is present.  There is no evidence of hydrocephalus, midline shift or extra-axial collection.  The bony calvarium is unremarkable.  IMPRESSION: Acute to subacute posterior left frontal lobe infarct without hemorrhage.  Chronic small-vessel white matter ischemic changes and remote left thalamic lacunar infarct.   Electronically Signed   By: Laveda Abbe M.D.   On: 10/21/2013 14:32   Mr Brain Wo Contrast  10/22/2013   CLINICAL DATA:  Expressive aphasia.  EXAM: MRI HEAD WITHOUT CONTRAST  MRA HEAD WITHOUT CONTRAST  TECHNIQUE: Multiplanar, multiecho pulse sequences of the brain and surrounding structures were obtained without intravenous contrast. Angiographic images of the head were obtained using MRA technique without contrast.  COMPARISON:  Head CT 10/21/2013  FINDINGS: MRI HEAD FINDINGS  There is a 4.8 cm in diameter  region of acute infarction affecting the left posterior frontal and insular region. The brain shows mild swelling. There is a small amount of petechial bleeding but no measurable hematoma. No significant mass effect or shift however. No other region of acute infarction.  There are old small vessel infarctions within the pons, more notable on the right. The cerebellum shows atrophy with a few old small vessel infarctions. There are old small vessel infarctions affecting the thalami and basal ganglia. There are old infarctions within the cerebral hemispheric white matter bilaterally. No other large vessel territory infarction. No mass lesion, hydrocephalus or extra-axial collection. No pituitary mass. No inflammatory sinus disease. No abnormality of the  skull or skullbase region.  MRA HEAD FINDINGS  Both internal carotid arteries are patent through the skullbase. There is atherosclerotic narrowing in both carotid siphon regions but no focal flow-limiting stenosis. There is atherosclerotic narrowing and irregularity of the medium size vessels throughout the anterior and middle cerebral artery territories bilaterally. No evidence of a correctable proximal stenosis. There are a few missing MCA branches on the left corresponding to the region of acute infarction.  The left vertebral artery is the dominant vessel widely patent to the basilar. The right vertebral artery shows a stenosis 1.5 cm proximal to the basilar. The basilar artery shows mild narrowing and irregularity but no correctable stenosis. The superior cerebellar and posterior cerebral arteries are patent bilaterally but show atherosclerotic narrowing and irregularity.  IMPRESSION: 4.8 cm region of acute infarction affecting the left posterior frontal and insular region. Petechial blood products are present but there is no frank hematoma. Mild swelling but no shift. No other acute infarction.  Extensive chronic small vessel insults throughout the brain as  outlined above.  MR angiography shows diffuse and widespread atherosclerotic narrowing and irregularity of the medium sized intracranial arteries. There are a few missing MCA branches on the left corresponding with the region of infarction. No correctable stenosis is seen.   Electronically Signed   By: Paulina Fusi M.D.   On: 10/22/2013 08:55   Mr Maxine Glenn Head/brain Wo Cm  10/22/2013   CLINICAL DATA:  Expressive aphasia.  EXAM: MRI HEAD WITHOUT CONTRAST  MRA HEAD WITHOUT CONTRAST  TECHNIQUE: Multiplanar, multiecho pulse sequences of the brain and surrounding structures were obtained without intravenous contrast. Angiographic images of the head were obtained using MRA technique without contrast.  COMPARISON:  Head CT 10/21/2013  FINDINGS: MRI HEAD FINDINGS  There is a 4.8 cm in diameter region of acute infarction affecting the left posterior frontal and insular region. The brain shows mild swelling. There is a small amount of petechial bleeding but no measurable hematoma. No significant mass effect or shift however. No other region of acute infarction.  There are old small vessel infarctions within the pons, more notable on the right. The cerebellum shows atrophy with a few old small vessel infarctions. There are old small vessel infarctions affecting the thalami and basal ganglia. There are old infarctions within the cerebral hemispheric white matter bilaterally. No other large vessel territory infarction. No mass lesion, hydrocephalus or extra-axial collection. No pituitary mass. No inflammatory sinus disease. No abnormality of the skull or skullbase region.  MRA HEAD FINDINGS  Both internal carotid arteries are patent through the skullbase. There is atherosclerotic narrowing in both carotid siphon regions but no focal flow-limiting stenosis. There is atherosclerotic narrowing and irregularity of the medium size vessels throughout the anterior and middle cerebral artery territories bilaterally. No evidence of a  correctable proximal stenosis. There are a few missing MCA branches on the left corresponding to the region of acute infarction.  The left vertebral artery is the dominant vessel widely patent to the basilar. The right vertebral artery shows a stenosis 1.5 cm proximal to the basilar. The basilar artery shows mild narrowing and irregularity but no correctable stenosis. The superior cerebellar and posterior cerebral arteries are patent bilaterally but show atherosclerotic narrowing and irregularity.   IMPRESSION: 4.8 cm region of acute infarction affecting the left posterior frontal and insular region. Petechial blood products are present but there is no frank hematoma. Mild swelling but no shift. No other acute infarction.  Extensive chronic small vessel insults throughout  the brain as outlined above.  MR angiography shows diffuse and widespread atherosclerotic narrowing and irregularity of the medium sized intracranial arteries. There are a few missing MCA branches on the left corresponding with the region of infarction. No correctable stenosis is seen.     Electronically Signed   By: Paulina Fusi M.D.   On: 10/22/2013 08:55    CBC  Recent Labs Lab 10/21/13 1403  WBC 10.7*  HGB 11.9*  HCT 36.0  PLT 284  MCV 87.6  MCH 29.0  MCHC 33.1  RDW 14.0  LYMPHSABS 2.0  MONOABS 1.0  EOSABS 0.0  BASOSABS 0.0    Chemistries   Recent Labs Lab 10/21/13 1403 10/22/13 0626  NA 141 140  K 4.2 3.7  CL 102 102  CO2 24 22  GLUCOSE 335* 147*  BUN 18 16  CREATININE 0.85 0.81  CALCIUM 9.5 8.8  AST 14  --   ALT 7  --   ALKPHOS 102  --   BILITOT 1.1  --    ------------------------------------------------------------------------------------------------------------------ estimated creatinine clearance is 66.4 ml/min (by C-G formula based on Cr of 0.81). ------------------------------------------------------------------------------------------------------------------ No results found for this  basename: HGBA1C,  in the last 72 hours ------------------------------------------------------------------------------------------------------------------  Recent Labs  10/22/13 0626  CHOL 131  HDL 36*  LDLCALC 67  TRIG 409  CHOLHDL 3.6   ------------------------------------------------------------------------------------------------------------------ No results found for this basename: TSH, T4TOTAL, FREET3, T3FREE, THYROIDAB,  in the last 72 hours ------------------------------------------------------------------------------------------------------------------ No results found for this basename: VITAMINB12, FOLATE, FERRITIN, TIBC, IRON, RETICCTPCT,  in the last 72 hours  Coagulation profile  Recent Labs Lab 10/21/13 1459  INR 1.05    No results found for this basename: DDIMER,  in the last 72 hours  Cardiac Enzymes No results found for this basename: CK, CKMB, TROPONINI, MYOGLOBIN,  in the last 168 hours ------------------------------------------------------------------------------------------------------------------ No components found with this basename: POCBNP,      Time Spent in minutes   35   Leroy Sea M.D on 10/22/2013 at 10:17 AM  Between 7am to 7pm - Pager - [phone]  After 7pm go to [email or web address] - password [email or web address]  And look for the night coverage person covering for me after hours  [facility] Office  [phone]   **Disclaimer: This note may have been dictated with voice recognition software. Similar sounding words can inadvertently be transcribed and this note may contain transcription errors which may not have been corrected upon publication of note.**

## 2013-10-22 NOTE — Progress Notes (Signed)
Rehab Admissions Coordinator Note:  Patient was screened by Clois Dupes for appropriateness for an Inpatient Acute Rehab Consult.  At this time, we are recommending Inpatient Rehab consult. Please place order.  Foye Spurling [facility] 10/22/2013, 2:20 PM  I can be reached at [phone].

## 2013-10-22 NOTE — Progress Notes (Signed)
Bilateral carotid artery duplex completed.  Right:  40-59% internal carotid artery stenosis.  Left:  1-39% ICA stenosis.  Bilateral:  Vertebral artery flow is antegrade.   

## 2013-10-22 NOTE — Progress Notes (Signed)
  Echocardiogram 2D Echocardiogram has been performed.  [name] [name] 10/22/2013, 10:05 AM

## 2013-10-22 NOTE — Progress Notes (Signed)
Occupational Therapy Evaluation Patient Details Name: [name] [name] MRN: [id number] DOB: [birth year] Today's Date: 10/22/2013    History of Present Illness 79 year old female admitted 10/21/13 due to expressive aphasia. PMH significant for DM2, HTN, CAD. CT revealed acute-subacute posterior left frontal infarct and remote left thalamic lacunar infarct.    Clinical Impression   PTA pt lived at home alone and was independent with ADLs and functional mobility. Pt/family reports that pt uses SPC at times for ambulation. Assisted pt in placing dinner order by phone. Pt able to communicate if given the time to process and respond. Pt required encouragement to get OOB and after sit<>stand requested to return to bed. Pt would benefit from continued skilled OT to increase independence with ADLs. Pt would be an excellent candidate for CIR.     Follow Up Recommendations  CIR;Supervision/Assistance - 24 hour    Equipment Recommendations  Other (comment) (Defer to next venue)       Precautions / Restrictions Precautions Precautions: Fall Restrictions Weight Bearing Restrictions: No      Mobility Bed Mobility Overal bed mobility: Needs Assistance Bed Mobility: Sit to Supine;Supine to Sit     Supine to sit: Min assist Sit to supine: Min assist   General bed mobility comments: (A) for trunk control, increased time due to weakness  Transfers Overall transfer level: Needs assistance Equipment used: Rolling walker (2 wheeled) Transfers: Sit to/from Stand Sit to Stand: Min assist         General transfer comment: Pt required (A) to power up to standing upright. Cues for sequencing and safety.          ADL Overall ADL's : Needs assistance/impaired Eating/Feeding: Independent;Sitting   Grooming: Wash/dry face;Brushing hair;Set up;Sitting   Upper Body Bathing: Minimal assitance;Sitting   Lower Body Bathing: Minimal assistance;Sit to/from stand   Upper Body Dressing : Moderate  assistance;Sitting   Lower Body Dressing: Moderate assistance;Sit to/from stand   Toilet Transfer: Minimal assistance;Ambulation;BSC;RW       Tub/ Shower Transfer: Total assistance   Functional mobility during ADLs: Minimal assistance;Rolling walker       Vision  Pt wears glasses for reading only. Pt reports no change from baseline.               Additional Comments: Brief vision screen performed with no abnormalities noted.   Perception Perception Perception Tested?: No   Praxis Praxis Praxis tested?: Within functional limits    Pertinent Vitals/Pain NAD     Hand Dominance Right   Extremity/Trunk Assessment Upper Extremity Assessment Upper Extremity Assessment: LUE deficits/detail;Generalized weakness LUE Deficits / Details: Pt reports she had L shoulder surgery for a fx related to a fall. Unable to report when the surgery occurred (per chart, 02/04/2013).     Lower Extremity Assessment Lower Extremity Assessment: Defer to PT evaluation   Cervical / Trunk Assessment Cervical / Trunk Assessment: Kyphotic   Communication Communication Communication: Expressive difficulties   Cognition Arousal/Alertness: Awake/alert Behavior During Therapy: Flat affect;Restless;Anxious Overall Cognitive Status: Impaired/Different from baseline Area of Impairment: Attention;Memory;Safety/judgement;Problem solving;Orientation Orientation Level: Disoriented to;Time;Situation Current Attention Level: Sustained Memory: Decreased short-term memory   Safety/Judgement: Decreased awareness of safety   Problem Solving: Slow processing General Comments: Pt with expressive aphasia, complicating true cognitive assessment. However pt appears to have slow processing and is disoriented to time and situation. Pt also demonstrates perseveration, specifically with numbers.               Home Living Family/patient expects to  be discharged to:: Private residence Living Arrangements:  Alone Available Help at Discharge: Available PRN/intermittently;Family Type of Home: House Home Access: Level entry     Home Layout: One level     Bathroom Shower/Tub: Chief Strategy Officer: Standard     Home Equipment: Medical laboratory scientific officer - single point      Lives With: Alone    Prior Functioning/Environment Level of Independence: Independent        Comments: Pt's family in room and reports that pt was independent, however uses SPC at times.     OT Diagnosis: Generalized weakness;Cognitive deficits   OT Problem List: Decreased strength;Decreased activity tolerance;Impaired balance (sitting and/or standing);Decreased cognition;Decreased safety awareness;Decreased knowledge of use of DME or AE;Impaired UE functional use   OT Treatment/Interventions: Self-care/ADL training;Therapeutic exercise;Neuromuscular education;Energy conservation;DME and/or AE instruction;Therapeutic activities;Cognitive remediation/compensation;Patient/family education;Balance training    OT Goals(Current goals can be found in the care plan section) Acute Rehab OT Goals Patient Stated Goal: To go home OT Goal Formulation: With patient Time For Goal Achievement: 11/05/13 Potential to Achieve Goals: Good ADL Goals Pt Will Perform Grooming: with supervision;standing Pt Will Perform Lower Body Bathing: with supervision;sit to/from stand Pt Will Perform Lower Body Dressing: with supervision;sit to/from stand Pt Will Transfer to Toilet: with supervision;ambulating;regular height toilet Pt Will Perform Tub/Shower Transfer: Tub transfer;with supervision;ambulating;rolling walker;3 in 1 Additional ADL Goal #1: Pt will perform bed mobility with Supervision to prepare for ADLs.   OT Frequency: Min 2X/week   Barriers to D/C: Decreased caregiver support  Pt lives alone and has family to assist PRN.           End of Session Equipment Utilized During Treatment: Gait belt;Rolling walker  Activity Tolerance:  Patient limited by fatigue Patient left: in bed;with call bell/phone within reach;with bed alarm set   Time: 1093-2355 OT Time Calculation (min): 32 min Charges:  OT General Charges $OT Visit: 1 Procedure OT Evaluation $Initial OT Evaluation Tier I: 1 Procedure OT Treatments $Self Care/Home Management : 8-22 mins  Rae Lips [phone] 10/22/2013, 4:36 PM

## 2013-10-22 NOTE — Evaluation (Signed)
Speech Language Pathology Evaluation Patient Details Name: [name] [name] MRN: [id number] DOB: 1934/11/25 Today's Date: 10/22/2013 Time: 1010-1055 SLP Time Calculation (min): 45 min  Problem List:  Patient Active Problem List   Diagnosis Date Noted  . CVA (cerebral infarction) 10/21/2013  . Infection of urinary tract 10/06/2013  . Diabetic eye exam 09/03/2013  . Right hip pain 08/04/2013  . Preop examination 01/27/2013  . Screening for breast cancer 01/14/2013  . Poorly controlled type 2 diabetes mellitus with circulatory disorder 02/04/2012  . Preoperative cardiovascular examination 10/29/2011  . Chronic UTI 06/12/2011  . GERD (gastroesophageal reflux disease) 06/12/2011  . Hyperlipidemia 06/12/2011  . Hypertension 05/17/2011  . Hypothyroidism 03/07/2011  . Chronic back pain 02/21/2011  . OBESITY 08/17/2008  . DEPRESSION 08/17/2008  . CAD (coronary artery disease) 08/17/2008  . Atrial flutter 08/17/2008  . GASTROESOPHAGEAL REFLUX DISEASE 08/17/2008  . OSTEOARTHRITIS 08/17/2008   Past Medical History:  Past Medical History  Diagnosis Date  . DM (diabetes mellitus)   . Paroxysmal atrial flutter   . Dyslipidemia   . CAD (coronary artery disease)     a. s/p prior PCI RCA with ISR req CBA;  b. 04/2013 Neg MV;  c. 05/2013 Cath: LM 20, LAD 30p, LCX 20p, OM1 90 small, RCA 22m, PDA 40-->Med Rx.  . Osteoarthritis   . Hypothyroidism     hx  . Rheumatoid arthritis(714.0)   . Obesity   . GERD (gastroesophageal reflux disease)   . Depression   . Shoulder fracture, left     Dr. Martha Clan   Past Surgical History:  Past Surgical History  Procedure Laterality Date  . Rca stent placement  2000  . Restenosis with ptca placement  2005  . Cataract surgery    . Partial hysterectomy    . Replacement total knee  2013    right  . Shoulder surgery  02/04/13  . Cardiac catheterization  06/11/2013  . Coronary angioplasty     HPI:  79 year old female admitted 10/21/13 due to expressive  aphasia. PMH significant for DM2, HTN, CAD. CT revealed acute-subacute posterior left frontal infarct and remote left thalamic lacunar infarct. SLE ordered to evaluate receptive and expressive language status.   Assessment / Plan / Recommendation Clinical Impression  Pt currently presents with mild-moderate receptive and moderate expressive aphasia, characterized as follows: RECEPTIVE: Pt responses to simple yes/no questions are NOT accurate. Pt is able to follow 1-2 step verbal directions, but was unable to follow 3 step commands. No visual or tactile cues were provided during assessment. Pt also had difficulty with right/left discrimination, although at this time, it is difficult to determine if this deficits is due to right/left discrimination or high level direction following. Pt is able to identify body parts and objects around her room without error. EXPRESSIVE: Pt requires assistance to start automatic sequences, then requries significant additional time, and perseverates on numbers. Pt is able to repeat single words up to 6 syllables, but accuracy begins to deteriorate at the phrase level. Pt completes sentences with 40% accuracy, responsive naming tasks also 40%. When attempting to name objects around the room, neologistic paraphasias prevail. She is unable to describe the activity in a photograph wtihout neologisms, and was unable to provide a list of animals.  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  Pt is very  pleasant and cooperative, and would benefit from continued skilled speech therapy intervention to improve receptive and expressive language skills for functional and effective communication. Continued ST after DC is anticipated.     SLP Assessment  Patient needs continued Speech Lanaguage Pathology Services    Follow Up Recommendations   Inpatient Rehab    Frequency and Duration min 2x/week  2 weeks   Pertinent Vitals/Pain VSS, no pain reported   SLP Goals  SLP Goals Potential to Achieve Goals: Good Potential Considerations: Ability to learn/carryover information;Financial resources;Family/community support;Cooperation/participation level;Previous level of function;Severity of impairments  SLP Evaluation Prior Functioning  Cognitive/Linguistic Baseline: Within functional limits Type of Home: House  Lives With: Alone Available Help at Discharge:  (unknown. No family present at this time) Education: 10th grade per pt Vocation: Retired   IT consultant  Overall Cognitive Status: Difficult to assess (due to language deficits)    Comprehension  Auditory Comprehension Overall Auditory Comprehension: Impaired Basic Biographical Questions: 51-75% accurate Basic Immediate Environment Questions: 75-100% accurate Commands: Impaired One Step Basic Commands: 75-100% accurate Two Step Basic Commands: 75-100% accurate Multistep Basic Commands: 25-49% accurate Conversation: Simple (social responses intact) Visual Recognition/Discrimination Discrimination: Not tested Reading Comprehension Reading Status: Not tested    Expression Expression Primary Mode of Expression: Verbal Verbal Expression Overall Verbal Expression: Impaired Initiation: No impairment Automatic Speech: Name;Social Response (requires cue to get started) Level of Generative/Spontaneous Verbalization: Phrase Repetition: Impaired Level of Impairment: Sentence level Naming: Impairment Responsive: 26-50% accurate Confrontation: Impaired Convergent: 0-24% accurate Divergent: 0-24% accurate Verbal Errors: Neologisms;Aware of errors;Perseveration Pragmatics: No impairment Non-Verbal Means of Communication: Not applicable Written Expression Written Expression: Not tested   Oral / Motor Oral Motor/Sensory Function Overall Oral Motor/Sensory Function:  Appears within functional limits for tasks assessed Motor Speech Overall Motor Speech: Appears within functional limits for tasks assessed Motor Speech Errors: Not applicable   GO    [name] [name] B. Murvin [facility], CCC-SLP [phone]  [name] [name] 10/22/2013, 11:16 AM

## 2013-10-22 NOTE — Progress Notes (Signed)
Stroke Team Progress Note  HISTORY  Chief Complaint: Stroke  HPI:   [name] [name] is an 79 y.o. female who lives alone. LSN is unknown. Daughter called patient 10/22/13 and she was not answering phone thus 911 was called. EMS arrived at scene and house was opened by landlord. Patient was found sitting at the table and not following commands. Patient was brought to ED. On presentation to ED exhibiting receptive>expressive aphasia.  Per chart she is on ASA and Lipitor  Date last known well: Unable to determine  Time last known well: Unable to determine   Patient was not administered TPA secondary to unable to determine last known well. She was admitted to the [facility] for further evaluation and treatment. [facility].   SUBJECTIVE Patient is alert but demonstrating speech difficulty.   OBJECTIVE Most recent Vital Signs: Filed Vitals:   10/22/13 0150 10/22/13 0333 10/22/13 0549 10/22/13 0938  BP: 157/51 158/62 172/52 128/70  Pulse: 68 68 70 71  Temp: 99.6 F (37.6 C) 98.5 F (36.9 C) 98 F (36.7 C) 99.1 F (37.3 C)  TempSrc: Oral Oral Oral Oral  Resp: 18 16 18 18   Height:      Weight:      SpO2: 92% 96% 93% 96%   CBG (last 3)   Recent Labs  10/21/13 1714 10/21/13 2139 10/22/13 0936  GLUCAP 298* 178* 181*    IV Fluid Intake:     MEDICATIONS  . aspirin  300 mg Rectal Daily   Or  . aspirin  325 mg Oral Daily  . atorvastatin  20 mg Oral Daily  . enoxaparin (LOVENOX) injection  40 mg Subcutaneous Q24H  . gabapentin  300 mg Oral TID  . insulin aspart  0-15 Units Subcutaneous TID WC  . insulin aspart  0-5 Units Subcutaneous QHS  . insulin glargine  50 Units Subcutaneous QHS  . levothyroxine  150 mcg Oral Daily   PRN:  senna-docusate, traMADol  Diet:  Carb Control thin liquids Activity:  Bedrest DVT Prophylaxis:  On apixaban  CLINICALLY SIGNIFICANT STUDIES Basic Metabolic Panel:  Recent Labs Lab 10/21/13 1403 10/22/13 0626  NA 141 140   K 4.2 3.7  CL 102 102  CO2 24 22  GLUCOSE 335* 147*  BUN 18 16  CREATININE 0.85 0.81  CALCIUM 9.5 8.8   Liver Function Tests:  Recent Labs Lab 10/21/13 1403  AST 14  ALT 7  ALKPHOS 102  BILITOT 1.1  PROT 7.3  ALBUMIN 3.4*   CBC:  Recent Labs Lab 10/21/13 1403  WBC 10.7*  NEUTROABS 7.7  HGB 11.9*  HCT 36.0  MCV 87.6  PLT 284   Coagulation:  Recent Labs Lab 10/21/13 1459  LABPROT 13.5  INR 1.05   Cardiac Enzymes: No results found for this basename: CKTOTAL, CKMB, CKMBINDEX, TROPONINI,  in the last 168 hours Urinalysis:  Recent Labs Lab 10/21/13 1445  COLORURINE YELLOW  LABSPEC 1.019  PHURINE 5.5  GLUCOSEU 100*  HGBUR SMALL*  BILIRUBINUR NEGATIVE  KETONESUR 15*  PROTEINUR >300*  UROBILINOGEN 0.2  NITRITE NEGATIVE  LEUKOCYTESUR NEGATIVE   Lipid Panel    Component Value Date/Time   CHOL 131 10/22/2013 0626   TRIG 142 10/22/2013 0626   HDL 36* 10/22/2013 0626   CHOLHDL 3.6 10/22/2013 0626   VLDL 28 10/22/2013 0626   LDLCALC 67 10/22/2013 0626   HgbA1C  Lab Results  Component Value Date   HGBA1C 8.3* 10/06/2013    Urine Drug Screen:  Component Value Date/Time   LABOPIA NONE DETECTED 10/21/2013 1445   COCAINSCRNUR NONE DETECTED 10/21/2013 1445   LABBENZ NONE DETECTED 10/21/2013 1445   AMPHETMU NONE DETECTED 10/21/2013 1445   THCU NONE DETECTED 10/21/2013 1445   LABBARB NONE DETECTED 10/21/2013 1445    Alcohol Level:  Recent Labs Lab 10/21/13 1459  ETH <11    Dg Chest 2 View  10/22/2013     IMPRESSION: No active cardiopulmonary disease.      Ct Head Wo Contrast  IMPRESSION: Acute to subacute posterior left frontal lobe infarct without hemorrhage.  Chronic small-vessel white matter ischemic changes and remote left thalamic lacunar infarct.    MRI/MRA   10/22/2013  IMPRESSION: 4.8 cm region of acute infarction affecting the left posterior frontal and insular region. Petechial blood products are present but there is no frank hematoma. Mild swelling but no  shift. No other acute infarction.  Extensive chronic small vessel insults.  MR angiography shows diffuse and widespread atherosclerotic narrowing and irregularity of the medium sized intracranial arteries. There are a few missing MCA branches on the left corresponding with the region of infarction. No correctable stenosis is seen.       2D Echocardiogram pending  Carotid Doppler 10/22/13 RICA 40-59% stenosis. LICA <39% stenosis.   EKG 10/21/13 Sinus rhythm  Therapy Recommendations PT recs CIR  Physical Exam   Frail elderly lady not in distress.Awake alert. Afebrile. Head is nontraumatic. Neck is supple without bruit. Hearing is normal. Cardiac exam no murmur or gallop. Lungs are clear to auscultation. Distal pulses are well felt. Neurological Exam :  Awake alert mild nonfluent speech with word hesitancy and dysfluency.follows commands. No paraphasias. Difficulty with naming.no facial weakness.moves all 4 limbs well. No focal weakness. No sensory loss. Gait deferred ASSESSMENT/PLAN [name] is a 79 y.o. female with pmh HTN, HLD, DM, CAD, atrial flutter presenting with mixed expressive and receptive aphasia and R sided weakness. Did not receive IV t-PA due to unable to determine last known well. Imaging confirms acute L MCA infarct, underlying etiology likely secondary to atrial fibrillation not on anticoagulation.  Patient not on anticoagulation prior to admission. Now on eliquis (apixaban) for secondary stroke prevention. Patient with resultant transcortical motor aphasia. Stroke work up underway.  L MCA Stroke in setting of atrial flutter not on anticoagulation MRI/MRA shows acute infarct in L posterior frontal and insular region, widespread atherosclerotic narrowings and irregularity of arteries Echo pending Carotid US shows 40-59% stenosis of RICA, and <39% stenosis of LICA Started Apixaban for antithrombotic therapy LDL 67, On atorvastatin 20  HgbA1C pending PT recs CIR  Hospital  day # 1  SIGNED Stephani Police, MSN, ANP-C, CNRN, MSCS [place] Stroke Team [phone]  I have personally obtained a history, examined the patient, evaluated imaging results, and formulated the assessment and plan of care. I agree with the above. Delia Heady, MD   To contact Stroke Continuity provider, please refer to [email or web address]. After hours, contact General Neurology

## 2013-10-23 LAB — GLUCOSE, CAPILLARY
GLUCOSE-CAPILLARY: 271 mg/dL — AB (ref 70–99)
Glucose-Capillary: 186 mg/dL — ABNORMAL HIGH (ref 70–99)
Glucose-Capillary: 306 mg/dL — ABNORMAL HIGH (ref 70–99)
Glucose-Capillary: 178 mg/dL — ABNORMAL HIGH (ref 70–99)

## 2013-10-23 MED ORDER — ACETAMINOPHEN 325 MG PO TABS
650.0000 mg | ORAL_TABLET | ORAL | Status: DC | PRN
  Administered 2013-10-23: 650 mg via ORAL
  Filled 2013-10-23: qty 2

## 2013-10-23 NOTE — Progress Notes (Signed)
Physical Therapy Treatment Patient Details Name: [name] [name] MRN: [id number] DOB: 1934/05/28 Today's Date: 10/23/2013    History of Present Illness 79 year old female admitted 10/21/13 due to expressive aphasia. PMH significant for DM2, HTN, CAD. CT revealed acute-subacute posterior left frontal infarct and remote left thalamic lacunar infarct.     PT Comments    Pt progressing with mobility but cont's to present with decreased balance, generalized weakness, & decreased safety.    Follow Up Recommendations  CIR     Equipment Recommendations  None recommended by PT    Recommendations for Other Services       Precautions / Restrictions Precautions Precautions: Fall Restrictions Weight Bearing Restrictions: No    Mobility  Bed Mobility                  Transfers Overall transfer level: Needs assistance Equipment used: Rolling walker (2 wheeled) Transfers: Sit to/from Stand Sit to Stand: Min assist         General transfer comment: cues for hand placement & technique.  Did not need (A) to achieve standing but (A) to control descent.    Ambulation/Gait Ambulation/Gait assistance: Min guard Ambulation Distance (Feet): 30 Feet Assistive device: Rolling walker (2 wheeled) Gait Pattern/deviations: Step-through pattern;Decreased stride length;Trunk flexed Gait velocity: decreased   General Gait Details: Pt with improved gait quality today but still with decreased stride & needs cues for obstacle negotiation.     Stairs            Wheelchair Mobility    Modified Rankin (Stroke Patients Only) Modified Rankin (Stroke Patients Only) Pre-Morbid Rankin Score: No symptoms Modified Rankin: Moderately severe disability     Balance Overall balance assessment: Needs assistance Sitting-balance support: Feet supported Sitting balance-Leahy Scale: Fair Sitting balance - Comments: Able to donn socks sitting in recliner     Standing balance-Leahy Scale:  Fair Standing balance comment: Pt stands with minimal sway when eyes open but when standing with eyes closed she has increased sway & requires min assist for balance however she does show good reactions as she reaches for RW directly in front of her to catch herself.                      Cognition Arousal/Alertness: Awake/alert Behavior During Therapy: Flat affect;Restless;Anxious Overall Cognitive Status: Impaired/Different from baseline Area of Impairment: Attention;Memory;Safety/judgement;Problem solving;Orientation Orientation Level: Disoriented to;Place;Time;Situation (answered when given options) Current Attention Level: Sustained     Safety/Judgement: Decreased awareness of safety   Problem Solving: Slow processing      Exercises      General Comments        Pertinent Vitals/Pain Responds "yes" when asked if she has pain but when asked where or what hurts she states "I don't know".      Home Living                      Prior Function            PT Goals (current goals can now be found in the care plan section) Acute Rehab PT Goals Patient Stated Goal: To go home PT Goal Formulation: With patient Time For Goal Achievement: 10/29/13 Potential to Achieve Goals: Good Progress towards PT goals: Progressing toward goals    Frequency  Min 4X/week    PT Plan Current plan remains appropriate    Co-evaluation             End of  Session   Activity Tolerance: Patient tolerated treatment well Patient left: in chair;with chair alarm set;with call bell/phone within reach     Time: 0917-0928 PT Time Calculation (min): 11 min  Charges:  $Gait Training: 8-22 mins                    G Codes:      Lara Mulch 31-Oct-2013, 9:53 AM  Verdell Face, PTA [phone] 2013/10/31

## 2013-10-23 NOTE — Progress Notes (Signed)
Patient Demographics  [name] [name], is a 79 y.o. female, DOB - [birth year], YNW:[id number]  Admit date - 10/21/2013   Admitting Physician Jeralyn Bennett, MD  Outpatient Primary MD for the patient is [name], MD  LOS - 2   Chief Complaint  Patient presents with  . Altered Mental Status           Subjective:    [name] today has, No headache, No chest pain, No abdominal pain - No Nausea, No new weakness tingling or numbness, No Cough - SOB. Still has problems processing words and understanding questions clearly.    Assessment & Plan    1. Exp Aphasia due to L MCA territory ischemic CVA with some petechial hemorrhages. Switched from aspirin to liquids in the light of atrial fib - flutter, continue statin LDL is less than 100, A1c is 8.3 Will adjust insulin for better control, Seen by PT OT and speech, neuro following. May require placement.   Carotids - Bilateral carotid artery duplex completed. Right: 40-59% internal carotid artery stenosis. Left: 1-39% ICA stenosis. Bilateral: Vertebral artery flow is antegrade.  Echo - - Left ventricle: The cavity size was normal. Wall thickness was increased in a pattern of mild LVH. Systolic function was normal. The estimated ejection fraction was in the range of 60% to 65%. Wall motion was normal; there were no regional wall motion abnormalities. Doppler parameters are consistent with abnormal left ventricular relaxation (grade 1 diastolic dysfunction). The E/e&' ratio is >15, suggesting elevated LV filling pressure. - Aortic valve: Trileaflet. Sclerosis without stenosis. There was no regurgitation. - Mitral valve: Moderately calcified posterior annulus. Sclerotic anterior mitral leaflet tip. There was mild, posteriorly directed  regurgitation. - Left atrium: Mildly dilated (area 24 cm2).  Impressions:  LVEF 60-65%, mild LAE, diastolic dysfunction with elevated LV filling pressure, MAD with mild MR, sclerotic AV and MV.    Lab Results  Component Value Date   HGBA1C 7.7* 10/22/2013    Lab Results  Component Value Date   CHOL 131 10/22/2013   HDL 36* 10/22/2013   LDLCALC 67 10/22/2013   LDLDIRECT 92.7 01/14/2013   TRIG 142 10/22/2013   CHOLHDL 3.6 10/22/2013     2.CAD with history of atrial flutter. Chest pain-free, on Lopressor, monitor on telemetry, will defer event monitor versus loop recorder and anticoagulation to stroke team.    3. Hypothyroidism. On Synthroid continue.    4. DM 2. A1c 8.3, on sliding scale along with Lantus. Will require A1c and glycemic monitoring and control outpatient post discharge.  Lab Results  Component Value Date   HGBA1C 7.7* 10/22/2013    CBG (last 3)   Recent Labs  10/22/13 1213 10/22/13 1622 10/22/13 2235  GLUCAP 175* 164* 252*      5. Hypertension. Continue home dose Lopressor and monitor.    6. Carotid artery stenosis. Significant stenosis on the right side. Does not correlate with the site of stroke, we'll continue secondary prevention with Eliquis and insulin for glycemic control, will require outpatient vascular surgery followup.      Code Status: Full  Family Communication: None present  Disposition Plan: To be decided   Procedures CT head, MRI/MRA brain, echogram, carotid duplex   Consults  Neuro  Medications  Scheduled Meds: . apixaban  5 mg Oral BID  . atorvastatin  20 mg Oral Daily  . gabapentin  300 mg Oral TID  . insulin aspart  0-15 Units Subcutaneous TID WC  . insulin aspart  0-5 Units Subcutaneous QHS  . insulin glargine  50 Units Subcutaneous QHS  . levothyroxine  150 mcg Oral Daily   Continuous Infusions:  PRN Meds:.senna-docusate, traMADol  DVT Prophylaxis  Lovenox   Lab Results  Component Value Date   PLT 284  10/21/2013    Antibiotics     Anti-infectives   None          Objective:   Filed Vitals:   10/23/13 0217 10/23/13 0427 10/23/13 0639 10/23/13 0950  BP: 171/54 148/41 144/51 109/45  Pulse: 74 72 66 79  Temp: 98.9 F (37.2 C) 98.8 F (37.1 C) 98.4 F (36.9 C) 98.1 F (36.7 C)  TempSrc: Axillary Oral Oral Oral  Resp: 22 20 20 20   Height:      Weight:      SpO2: 96% 96% 93% 100%    Wt Readings from Last 3 Encounters:  10/21/13 99.4 kg (219 lb 2.2 oz)  10/19/13 101.379 kg (223 lb 8 oz)  10/06/13 102.286 kg (225 lb 8 oz)     Intake/Output Summary (Last 24 hours) at 10/23/13 1017 Last data filed at 10/22/13 1700  Gross per 24 hour  Intake    240 ml  Output      0 ml  Net    240 ml     Physical Exam  Awake Alert, Oriented X 3, No new F.N deficits, does have expressive aphasia, Normal affect [place].AT,PERRAL Supple Neck,No JVD, No cervical lymphadenopathy appriciated.  Symmetrical Chest wall movement, Good air movement bilaterally, CTAB RRR,No Gallops,Rubs or new Murmurs, No Parasternal Heave +ve B.Sounds, Abd Soft, No tenderness, No organomegaly appriciated, No rebound - guarding or rigidity. No Cyanosis, Clubbing or edema, No new Rash or bruise      Data Review   Micro Results No results found for this or any previous visit (from the past 240 hour(s)).  Radiology Reports Dg Chest 2 View  10/22/2013   CLINICAL DATA:  Stroke.  EXAM: CHEST  2 VIEW  COMPARISON:  05/21/2012  FINDINGS: The cardiac silhouette remains enlarged. The lungs are slightly less well inflated than on the prior study with minimal prominence of the central pulmonary vasculature. There is no evidence of acute airspace consolidation, overt pulmonary edema, pleural effusion, or pneumothorax. Focal linear opacity in the left midlung is unchanged and may reflect scarring. No acute osseous abnormality is identified.  IMPRESSION: No active cardiopulmonary disease.   Electronically Signed   By: Sebastian Ache    On: 10/22/2013 08:29   Ct Head Wo Contrast  10/21/2013   CLINICAL DATA:  79 year old female with altered mental status.  EXAM: CT HEAD WITHOUT CONTRAST  TECHNIQUE: Contiguous axial images were obtained from the base of the skull through the vertex without intravenous contrast.  COMPARISON:  02/06/2009  FINDINGS: An infarct involving the posterior left frontal lobe is identified without hemorrhage - acute to subacute.  Mild chronic small-vessel white matter ischemic changes are present.  A remote lacunar infarct within the left thalamus is present.  There is no evidence of hydrocephalus, midline shift or extra-axial collection.  The bony calvarium is unremarkable.  IMPRESSION: Acute to subacute posterior left frontal lobe infarct without hemorrhage.  Chronic small-vessel white matter ischemic changes and remote left thalamic lacunar  infarct.   Electronically Signed   By: Laveda Abbe M.D.   On: 10/21/2013 14:32   Mr Brain Wo Contrast  10/22/2013   CLINICAL DATA:  Expressive aphasia.  EXAM: MRI HEAD WITHOUT CONTRAST  MRA HEAD WITHOUT CONTRAST  TECHNIQUE: Multiplanar, multiecho pulse sequences of the brain and surrounding structures were obtained without intravenous contrast. Angiographic images of the head were obtained using MRA technique without contrast.  COMPARISON:  Head CT 10/21/2013  FINDINGS: MRI HEAD FINDINGS  There is a 4.8 cm in diameter region of acute infarction affecting the left posterior frontal and insular region. The brain shows mild swelling. There is a small amount of petechial bleeding but no measurable hematoma. No significant mass effect or shift however. No other region of acute infarction.  There are old small vessel infarctions within the pons, more notable on the right. The cerebellum shows atrophy with a few old small vessel infarctions. There are old small vessel infarctions affecting the thalami and basal ganglia. There are old infarctions within the cerebral hemispheric white matter  bilaterally. No other large vessel territory infarction. No mass lesion, hydrocephalus or extra-axial collection. No pituitary mass. No inflammatory sinus disease. No abnormality of the skull or skullbase region.  MRA HEAD FINDINGS  Both internal carotid arteries are patent through the skullbase. There is atherosclerotic narrowing in both carotid siphon regions but no focal flow-limiting stenosis. There is atherosclerotic narrowing and irregularity of the medium size vessels throughout the anterior and middle cerebral artery territories bilaterally. No evidence of a correctable proximal stenosis. There are a few missing MCA branches on the left corresponding to the region of acute infarction.  The left vertebral artery is the dominant vessel widely patent to the basilar. The right vertebral artery shows a stenosis 1.5 cm proximal to the basilar. The basilar artery shows mild narrowing and irregularity but no correctable stenosis. The superior cerebellar and posterior cerebral arteries are patent bilaterally but show atherosclerotic narrowing and irregularity.  IMPRESSION: 4.8 cm region of acute infarction affecting the left posterior frontal and insular region. Petechial blood products are present but there is no frank hematoma. Mild swelling but no shift. No other acute infarction.  Extensive chronic small vessel insults throughout the brain as outlined above.  MR angiography shows diffuse and widespread atherosclerotic narrowing and irregularity of the medium sized intracranial arteries. There are a few missing MCA branches on the left corresponding with the region of infarction. No correctable stenosis is seen.   Electronically Signed   By: Paulina Fusi M.D.   On: 10/22/2013 08:55   Mr Maxine Glenn Head/brain Wo Cm  10/22/2013   CLINICAL DATA:  Expressive aphasia.  EXAM: MRI HEAD WITHOUT CONTRAST  MRA HEAD WITHOUT CONTRAST  TECHNIQUE: Multiplanar, multiecho pulse sequences of the brain and surrounding structures were  obtained without intravenous contrast. Angiographic images of the head were obtained using MRA technique without contrast.  COMPARISON:  Head CT 10/21/2013  FINDINGS: MRI HEAD FINDINGS  There is a 4.8 cm in diameter region of acute infarction affecting the left posterior frontal and insular region. The brain shows mild swelling. There is a small amount of petechial bleeding but no measurable hematoma. No significant mass effect or shift however. No other region of acute infarction.  There are old small vessel infarctions within the pons, more notable on the right. The cerebellum shows atrophy with a few old small vessel infarctions. There are old small vessel infarctions affecting the thalami and basal ganglia. There are old infarctions  within the cerebral hemispheric white matter bilaterally. No other large vessel territory infarction. No mass lesion, hydrocephalus or extra-axial collection. No pituitary mass. No inflammatory sinus disease. No abnormality of the skull or skullbase region.  MRA HEAD FINDINGS  Both internal carotid arteries are patent through the skullbase. There is atherosclerotic narrowing in both carotid siphon regions but no focal flow-limiting stenosis. There is atherosclerotic narrowing and irregularity of the medium size vessels throughout the anterior and middle cerebral artery territories bilaterally. No evidence of a correctable proximal stenosis. There are a few missing MCA branches on the left corresponding to the region of acute infarction.  The left vertebral artery is the dominant vessel widely patent to the basilar. The right vertebral artery shows a stenosis 1.5 cm proximal to the basilar. The basilar artery shows mild narrowing and irregularity but no correctable stenosis. The superior cerebellar and posterior cerebral arteries are patent bilaterally but show atherosclerotic narrowing and irregularity.   IMPRESSION: 4.8 cm region of acute infarction affecting the left posterior  frontal and insular region. Petechial blood products are present but there is no frank hematoma. Mild swelling but no shift. No other acute infarction.  Extensive chronic small vessel insults throughout the brain as outlined above.  MR angiography shows diffuse and widespread atherosclerotic narrowing and irregularity of the medium sized intracranial arteries. There are a few missing MCA branches on the left corresponding with the region of infarction. No correctable stenosis is seen.     Electronically Signed   By: Paulina Fusi M.D.   On: 10/22/2013 08:55    CBC  Recent Labs Lab 10/21/13 1403  WBC 10.7*  HGB 11.9*  HCT 36.0  PLT 284  MCV 87.6  MCH 29.0  MCHC 33.1  RDW 14.0  LYMPHSABS 2.0  MONOABS 1.0  EOSABS 0.0  BASOSABS 0.0    Chemistries   Recent Labs Lab 10/21/13 1403 10/22/13 0626  NA 141 140  K 4.2 3.7  CL 102 102  CO2 24 22  GLUCOSE 335* 147*  BUN 18 16  CREATININE 0.85 0.81  CALCIUM 9.5 8.8  AST 14  --   ALT 7  --   ALKPHOS 102  --   BILITOT 1.1  --    ------------------------------------------------------------------------------------------------------------------ estimated creatinine clearance is 66.4 ml/min (by C-G formula based on Cr of 0.81). ------------------------------------------------------------------------------------------------------------------  Recent Labs  10/22/13 0626  HGBA1C 7.7*   ------------------------------------------------------------------------------------------------------------------  Recent Labs  10/22/13 0626  CHOL 131  HDL 36*  LDLCALC 67  TRIG 161  CHOLHDL 3.6   ------------------------------------------------------------------------------------------------------------------ No results found for this basename: TSH, T4TOTAL, FREET3, T3FREE, THYROIDAB,  in the last 72 hours ------------------------------------------------------------------------------------------------------------------ No results found for  this basename: VITAMINB12, FOLATE, FERRITIN, TIBC, IRON, RETICCTPCT,  in the last 72 hours  Coagulation profile  Recent Labs Lab 10/21/13 1459  INR 1.05    No results found for this basename: DDIMER,  in the last 72 hours  Cardiac Enzymes No results found for this basename: CK, CKMB, TROPONINI, MYOGLOBIN,  in the last 168 hours ------------------------------------------------------------------------------------------------------------------ No components found with this basename: POCBNP,      Time Spent in minutes   35   Leroy Sea M.D on 10/23/2013 at 10:17 AM  Between 7am to 7pm - Pager - [phone]  After 7pm go to [email or web address] - password [email or web address]  And look for the night coverage person covering for me after hours  [facility] Office  [phone]   **Disclaimer: This note may have been dictated with  voice recognition software. Similar sounding words can inadvertently be transcribed and this note may contain transcription errors which may not have been corrected upon publication of note.**

## 2013-10-23 NOTE — Progress Notes (Signed)
Stroke Team Progress Note  HISTORY  Chief Complaint: Stroke  HPI:   [name] [name] is a 79 y.o. female who lives alone. LSN was unknown. Daughter called patient 10/22/13 and she was not answering phone thus 911 was called. EMS arrived at scene and house was opened by landlord. Patient was found sitting at the table and not following commands. Patient was brought to ED. On presentation to ED exhibiting receptive>expressive aphasia.  Per chart she was on ASA and Lipitor  Date last known well: Unable to determine  Time last known well: Unable to determine   Patient was not administered TPA secondary to unable to determine last known well. She was admitted to the [facility] Medicine Service for further evaluation and treatment. Neurology Consulting.   SUBJECTIVE There no family members present. The patient feels she is still having difficulty with her speech.  OBJECTIVE Most recent Vital Signs: Filed Vitals:   10/22/13 2238 10/23/13 0217 10/23/13 0427 10/23/13 0639  BP: 157/48 171/54 148/41 144/51  Pulse: 73 74 72 66  Temp: 99.7 F (37.6 C) 98.9 F (37.2 C) 98.8 F (37.1 C) 98.4 F (36.9 C)  TempSrc: Oral Axillary Oral Oral  Resp: 18 22 20 20   Height:      Weight:      SpO2: 94% 96% 96% 93%   CBG (last 3)   Recent Labs  10/22/13 1213 10/22/13 1622 10/22/13 2235  GLUCAP 175* 164* 252*    IV Fluid Intake:     MEDICATIONS  . apixaban  5 mg Oral BID  . atorvastatin  20 mg Oral Daily  . gabapentin  300 mg Oral TID  . insulin aspart  0-15 Units Subcutaneous TID WC  . insulin aspart  0-5 Units Subcutaneous QHS  . insulin glargine  50 Units Subcutaneous QHS  . levothyroxine  150 mcg Oral Daily   PRN:  senna-docusate, traMADol  Diet:  Carb Control thin liquids Activity:  Bedrest DVT Prophylaxis:  On apixaban  CLINICALLY SIGNIFICANT STUDIES Basic Metabolic Panel:   Recent Labs Lab 10/21/13 1403 10/22/13 0626  NA 141 140  K 4.2 3.7  CL 102 102  CO2 24 22  GLUCOSE  335* 147*  BUN 18 16  CREATININE 0.85 0.81  CALCIUM 9.5 8.8   Liver Function Tests:   Recent Labs Lab 10/21/13 1403  AST 14  ALT 7  ALKPHOS 102  BILITOT 1.1  PROT 7.3  ALBUMIN 3.4*   CBC:   Recent Labs Lab 10/21/13 1403  WBC 10.7*  NEUTROABS 7.7  HGB 11.9*  HCT 36.0  MCV 87.6  PLT 284   Coagulation:   Recent Labs Lab 10/21/13 1459  LABPROT 13.5  INR 1.05   Cardiac Enzymes: No results found for this basename: CKTOTAL, CKMB, CKMBINDEX, TROPONINI,  in the last 168 hours Urinalysis:   Recent Labs Lab 10/21/13 1445  COLORURINE YELLOW  LABSPEC 1.019  PHURINE 5.5  GLUCOSEU 100*  HGBUR SMALL*  BILIRUBINUR NEGATIVE  KETONESUR 15*  PROTEINUR >300*  UROBILINOGEN 0.2  NITRITE NEGATIVE  LEUKOCYTESUR NEGATIVE   Lipid Panel    Component Value Date/Time   CHOL 131 10/22/2013 0626   TRIG 142 10/22/2013 0626   HDL 36* 10/22/2013 0626   CHOLHDL 3.6 10/22/2013 0626   VLDL 28 10/22/2013 0626   LDLCALC 67 10/22/2013 0626   HgbA1C  Lab Results  Component Value Date   HGBA1C 7.7* 10/22/2013    Urine Drug Screen:     Component Value Date/Time  LABOPIA NONE DETECTED 10/21/2013 1445   COCAINSCRNUR NONE DETECTED 10/21/2013 1445   LABBENZ NONE DETECTED 10/21/2013 1445   AMPHETMU NONE DETECTED 10/21/2013 1445   THCU NONE DETECTED 10/21/2013 1445   LABBARB NONE DETECTED 10/21/2013 1445    Alcohol Level:   Recent Labs Lab 10/21/13 1459  ETH <11    Dg Chest 2 View  10/22/2013     IMPRESSION: No active cardiopulmonary disease.      Ct Head Wo Contrast 10/21/2013 Acute to subacute posterior left frontal lobe infarct without hemorrhage.  Chronic small-vessel white matter ischemic changes and remote left thalamic lacunar infarct.    MRI/MRA  10/22/2013 4.8 cm region of acute infarction affecting the left posterior frontal and insular region. Petechial blood products are present but there is no frank hematoma. Mild swelling but no shift. No other acute infarction.  Extensive chronic  small vessel insults.    MR angiography  Diffuse and widespread atherosclerotic narrowing and irregularity of the medium sized intracranial arteries. There are a few missing MCA branches on the left corresponding with the region of infarction. No correctable stenosis is seen.       2D Echocardiogram - ejection fraction 60-65%. No cardiac source of emboli identified.  Carotid Doppler 10/22/13 RICA 40-59% stenosis. LICA <39% stenosis.   EKG 10/21/13 Sinus rhythm  Therapy Recommendations PT recs CIR  Physical Exam   Frail elderly lady not in distress.Awake alert. Afebrile. Head is nontraumatic. Neck is supple without bruit. Hearing is normal. Cardiac exam no murmur or gallop. Lungs are clear to auscultation. Distal pulses are well felt. Neurological Exam :  Awake alert mild nonfluent speech with word hesitancy and dysfluency.  Able to follow commands. No paraphasias. Naming intact. No facial weakness.moves all 4 limbs well. No focal weakness. Mildly decreased sensation on the left.. Gait deferred   ASSESSMENT/PLAN [name] is a 79 y.o. female with pmh HTN, HLD, DM, CAD, atrial flutter presenting with mixed expressive and receptive aphasia and R sided weakness. Did not receive IV t-PA due to unable to determine last known well. Imaging confirms acute L MCA infarct, underlying etiology likely secondary to atrial fibrillation not on anticoagulation.  Patient not on anticoagulation prior to admission. Now on eliquis (apixaban) for secondary stroke prevention. Patient with resultant transcortical motor aphasia. Stroke work up underway.  L MCA Stroke in setting of atrial flutter not on anticoagulation MRI/MRA shows acute infarct in L posterior frontal and insular region, widespread atherosclerotic narrowings and irregularity of arteries Started Apixaban for antithrombotic therapy LDL 67, On atorvastatin 20  HgbA1C - 7.7  (goal less than 7) PT recs CIR - will order M.D. rehabilitation  consult.  Stroke team will sign off - please call for further questions or concerns. Followup Dr. Pearlean Brownie in 2 months.  Hospital day # 2  SIGNED Delton See PA-C [facility] Pager [phone] 10/23/2013, 8:53 AM   I have personally obtained a history, examined the patient, evaluated imaging results, and formulated the assessment and plan of care. I agree with the above.   Elspeth Cho, DO [facility] Pager: [phone]  To contact Stroke Continuity provider, please refer to [email or web address]. After hours, contact General Neurology

## 2013-10-24 LAB — GLUCOSE, CAPILLARY
GLUCOSE-CAPILLARY: 256 mg/dL — AB (ref 70–99)
Glucose-Capillary: 183 mg/dL — ABNORMAL HIGH (ref 70–99)
Glucose-Capillary: 208 mg/dL — ABNORMAL HIGH (ref 70–99)
Glucose-Capillary: 289 mg/dL — ABNORMAL HIGH (ref 70–99)

## 2013-10-24 LAB — CLOSTRIDIUM DIFFICILE BY PCR: Toxigenic C. Difficile by PCR: NEGATIVE

## 2013-10-24 MED ORDER — LOPERAMIDE HCL 2 MG PO CAPS
2.0000 mg | ORAL_CAPSULE | Freq: Four times a day (QID) | ORAL | Status: DC | PRN
  Filled 2013-10-24 (×2): qty 1

## 2013-10-24 NOTE — Discharge Instructions (Addendum)
Follow with Primary MD [name], MD in 7 days   Get CBC, CMP,    by Primary MD next visit.    Activity: As tolerated with Full fall precautions use walker/cane & assistance as needed   Disposition Home    Diet: Heart Healthy Low Carb, feeding assistance and aspiration precautions.  For Heart failure patients - Check your Weight same time everyday, if you gain over 2 pounds, or you develop in leg swelling, experience more shortness of breath or chest pain, call your Primary MD immediately. Follow Cardiac Low Salt Diet and 1.8 lit/day fluid restriction.   On your next visit with her primary care physician please Get Medicines reviewed and adjusted.  Please request your Prim.MD to go over all Hospital Tests and Procedure/Radiological results at the follow up, please get all Hospital records sent to your Prim MD by signing hospital release before you go home.   If you experience worsening of your admission symptoms, develop shortness of breath, life threatening emergency, suicidal or homicidal thoughts you must seek medical attention immediately by calling 911 or calling your MD immediately  if symptoms less severe.  You Must read complete instructions/literature along with all the possible adverse reactions/side effects for all the Medicines you take and that have been prescribed to you. Take any new Medicines after you have completely understood and accpet all the possible adverse reactions/side effects.   Do not drive and provide baby sitting services if your were admitted for syncope or siezures until you have seen by Primary MD or a Neurologist and advised to do so again.  Do not drive when taking Pain medications.    Do not take more than prescribed Pain, Sleep and Anxiety Medications  Special Instructions: If you have smoked or chewed Tobacco  in the last 2 yrs please stop smoking, stop any regular Alcohol  and or any Recreational drug use.  Wear Seat belts while  driving.   Please note  You were cared for by a hospitalist during your hospital stay. If you have any questions about your discharge medications or the care you received while you were in the hospital after you are discharged, you can call the unit and asked to speak with the hospitalist on call if the hospitalist that took care of you is not available. Once you are discharged, your primary care physician will handle any further medical issues. Please note that NO REFILLS for any discharge medications will be authorized once you are discharged, as it is imperative that you return to your primary care physician (or establish a relationship with a primary care physician if you do not have one) for your aftercare needs so that they can reassess your need for medications and monitor your lab values.       Information on my medicine - ELIQUIS (apixaban)  This medication education was reviewed with me or my healthcare representative as part of my discharge preparation.    Why was Eliquis prescribed for you? Eliquis was prescribed for you to reduce the risk of a blood clot forming that can cause a stroke if you have a medical condition called atrial fibrillation (a type of irregular heartbeat).  What do You need to know about Eliquis ? Take your Eliquis 5mg  TWICE DAILY - one tablet in the morning and one tablet in the evening with or without food. If you have difficulty swallowing the tablet whole please discuss with your pharmacist how to take the medication safely.  Take Eliquis  exactly as prescribed by your doctor and DO NOT stop taking Eliquis without talking to the doctor who prescribed the medication.  Stopping may increase your risk of developing a stroke.  Refill your prescription before you run out.  After discharge, you should have regular check-up appointments with your healthcare provider that is prescribing your Eliquis.  In the future your dose may need to be changed if your  kidney function or weight changes by a significant amount or as you get older.  What do you do if you miss a dose? If you miss a dose, take it as soon as you remember on the same day and resume taking twice daily.  Do not take more than one dose of ELIQUIS at the same time to make up a missed dose.  Important Safety Information A possible side effect of Eliquis is bleeding. You should call your healthcare provider right away if you experience any of the following:   Bleeding from an injury or your nose that does not stop.   Unusual colored urine (red or dark brown) or unusual colored stools (red or black).   Unusual bruising for unknown reasons.   A serious fall or if you hit your head (even if there is no bleeding).  Some medicines may interact with Eliquis and might increase your risk of bleeding or clotting while on Eliquis. To help avoid this, consult your healthcare provider or pharmacist prior to using any new prescription or non-prescription medications, including herbals, vitamins, non-steroidal anti-inflammatory drugs (NSAIDs) and supplements.  This website has more information on Eliquis (apixaban): [email or web address].com.cy.

## 2013-10-24 NOTE — Progress Notes (Signed)
Patient Demographics  [name] [name], is a 79 y.o. female, DOB - 1935-05-15, VPX:[id number]  Admit date - 10/21/2013   Admitting Physician Jeralyn Bennett, MD  Outpatient Primary MD for the patient is [name], MD  LOS - 3   Chief Complaint  Patient presents with  . Altered Mental Status           Subjective:    [name] [name] today has, No headache, No chest pain, No abdominal pain - No Nausea, No new weakness tingling or numbness, No Cough - SOB. Still has problems processing words and understanding questions clearly.    Assessment & Plan    1. Exp Aphasia due to L MCA territory ischemic CVA with some petechial hemorrhages. Switched from aspirin to liquids in the light of atrial fib - flutter, continue statin LDL is less than 100, A1c is 8.3 Will adjust insulin for better control, Seen by PT OT and speech, neuro following. She refuses placement and will like to go home on 10/25/13.  Carotids - Bilateral carotid artery duplex completed. Right: 40-59% internal carotid artery stenosis. Left: 1-39% ICA stenosis. Bilateral: Vertebral artery flow is antegrade.  Echo - - Left ventricle: The cavity size was normal. Wall thickness was increased in a pattern of mild LVH. Systolic function was normal. The estimated ejection fraction was in the range of 60% to 65%. Wall motion was normal; there were no regional wall motion abnormalities. Doppler parameters are consistent with abnormal left ventricular relaxation (grade 1 diastolic dysfunction). The E/e&' ratio is >15, suggesting elevated LV filling pressure. - Aortic valve: Trileaflet. Sclerosis without stenosis. There was no regurgitation. - Mitral valve: Moderately calcified posterior annulus. Sclerotic anterior mitral leaflet tip. There was  mild, posteriorly directed regurgitation. - Left atrium: Mildly dilated (area 24 cm2).  Impressions:  LVEF 60-65%, mild LAE, diastolic dysfunction with elevated LV filling pressure, MAD with mild MR, sclerotic AV and MV.    Lab Results  Component Value Date   HGBA1C 7.7* 10/22/2013    Lab Results  Component Value Date   CHOL 131 10/22/2013   HDL 36* 10/22/2013   LDLCALC 67 10/22/2013   LDLDIRECT 92.7 01/14/2013   TRIG 142 10/22/2013   CHOLHDL 3.6 10/22/2013     2.CAD with history of atrial flutter. Chest pain-free, on Lopressor, monitor on telemetry, will defer event monitor versus loop recorder and anticoagulation to stroke team.    3. Hypothyroidism. On Synthroid continue.    4. DM 2. A1c 8.3, on sliding scale along with Lantus. Will require A1c and glycemic monitoring and control outpatient post discharge.  Lab Results  Component Value Date   HGBA1C 7.7* 10/22/2013    CBG (last 3)   Recent Labs  10/23/13 1636 10/23/13 2142 10/24/13 0654  GLUCAP 306* 178* 183*      5. Hypertension. Continue home dose Lopressor and monitor.    6. Carotid artery stenosis. Significant stenosis on the right side. Does not correlate with the site of stroke, we'll continue secondary prevention with Eliquis and insulin for glycemic control, will require outpatient vascular surgery followup.      Code Status: Full  Family Communication: None present  Disposition Plan: To be decided   Procedures CT head, MRI/MRA brain, echogram, carotid  duplex   Consults  Neuro   Medications  Scheduled Meds: . apixaban  5 mg Oral BID  . atorvastatin  20 mg Oral Daily  . gabapentin  300 mg Oral TID  . insulin aspart  0-15 Units Subcutaneous TID WC  . insulin aspart  0-5 Units Subcutaneous QHS  . insulin glargine  50 Units Subcutaneous QHS  . levothyroxine  150 mcg Oral Daily   Continuous Infusions:  PRN Meds:.acetaminophen, senna-docusate, traMADol  DVT Prophylaxis  Lovenox   Lab  Results  Component Value Date   PLT 284 10/21/2013    Antibiotics     Anti-infectives   None          Objective:   Filed Vitals:   10/23/13 1807 10/23/13 2100 10/24/13 0125 10/24/13 0528  BP: 122/59 127/55 139/44 169/53  Pulse: 72 70 60 59  Temp: 98.1 F (36.7 C) 100.3 F (37.9 C) 98.1 F (36.7 C) 98.5 F (36.9 C)  TempSrc: Oral Oral Oral Oral  Resp: 20 20 18 18   Height:      Weight:      SpO2: 97% 94% 95% 96%    Wt Readings from Last 3 Encounters:  10/21/13 99.4 kg (219 lb 2.2 oz)  10/19/13 101.379 kg (223 lb 8 oz)  10/06/13 102.286 kg (225 lb 8 oz)     Intake/Output Summary (Last 24 hours) at 10/24/13 0921 Last data filed at 10/24/13 0800  Gross per 24 hour  Intake    240 ml  Output      0 ml  Net    240 ml     Physical Exam  Awake Alert, Oriented X 3, No new F.N deficits, does have expressive aphasia, Normal affect [place].AT,PERRAL Supple Neck,No JVD, No cervical lymphadenopathy appriciated.  Symmetrical Chest wall movement, Good air movement bilaterally, CTAB RRR,No Gallops,Rubs or new Murmurs, No Parasternal Heave +ve B.Sounds, Abd Soft, No tenderness, No organomegaly appriciated, No rebound - guarding or rigidity. No Cyanosis, Clubbing or edema, No new Rash or bruise      Data Review   Micro Results No results found for this or any previous visit (from the past 240 hour(s)).  Radiology Reports Dg Chest 2 View  10/22/2013   CLINICAL DATA:  Stroke.  EXAM: CHEST  2 VIEW  COMPARISON:  05/21/2012  FINDINGS: The cardiac silhouette remains enlarged. The lungs are slightly less well inflated than on the prior study with minimal prominence of the central pulmonary vasculature. There is no evidence of acute airspace consolidation, overt pulmonary edema, pleural effusion, or pneumothorax. Focal linear opacity in the left midlung is unchanged and may reflect scarring. No acute osseous abnormality is identified.  IMPRESSION: No active cardiopulmonary disease.    Electronically Signed   By: 07/19/2012   On: 10/22/2013 08:29   Ct Head Wo Contrast  10/21/2013   CLINICAL DATA:  79 year old female with altered mental status.  EXAM: CT HEAD WITHOUT CONTRAST  TECHNIQUE: Contiguous axial images were obtained from the base of the skull through the vertex without intravenous contrast.  COMPARISON:  02/06/2009  FINDINGS: An infarct involving the posterior left frontal lobe is identified without hemorrhage - acute to subacute.  Mild chronic small-vessel white matter ischemic changes are present.  A remote lacunar infarct within the left thalamus is present.  There is no evidence of hydrocephalus, midline shift or extra-axial collection.  The bony calvarium is unremarkable.  IMPRESSION: Acute to subacute posterior left frontal lobe infarct without hemorrhage.  Chronic small-vessel  white matter ischemic changes and remote left thalamic lacunar infarct.   Electronically Signed   By: Laveda Abbe M.D.   On: 10/21/2013 14:32   Mr Brain Wo Contrast  10/22/2013   CLINICAL DATA:  Expressive aphasia.  EXAM: MRI HEAD WITHOUT CONTRAST  MRA HEAD WITHOUT CONTRAST  TECHNIQUE: Multiplanar, multiecho pulse sequences of the brain and surrounding structures were obtained without intravenous contrast. Angiographic images of the head were obtained using MRA technique without contrast.  COMPARISON:  Head CT 10/21/2013  FINDINGS: MRI HEAD FINDINGS  There is a 4.8 cm in diameter region of acute infarction affecting the left posterior frontal and insular region. The brain shows mild swelling. There is a small amount of petechial bleeding but no measurable hematoma. No significant mass effect or shift however. No other region of acute infarction.  There are old small vessel infarctions within the pons, more notable on the right. The cerebellum shows atrophy with a few old small vessel infarctions. There are old small vessel infarctions affecting the thalami and basal ganglia. There are old infarctions within  the cerebral hemispheric white matter bilaterally. No other large vessel territory infarction. No mass lesion, hydrocephalus or extra-axial collection. No pituitary mass. No inflammatory sinus disease. No abnormality of the skull or skullbase region.  MRA HEAD FINDINGS  Both internal carotid arteries are patent through the skullbase. There is atherosclerotic narrowing in both carotid siphon regions but no focal flow-limiting stenosis. There is atherosclerotic narrowing and irregularity of the medium size vessels throughout the anterior and middle cerebral artery territories bilaterally. No evidence of a correctable proximal stenosis. There are a few missing MCA branches on the left corresponding to the region of acute infarction.  The left vertebral artery is the dominant vessel widely patent to the basilar. The right vertebral artery shows a stenosis 1.5 cm proximal to the basilar. The basilar artery shows mild narrowing and irregularity but no correctable stenosis. The superior cerebellar and posterior cerebral arteries are patent bilaterally but show atherosclerotic narrowing and irregularity.  IMPRESSION: 4.8 cm region of acute infarction affecting the left posterior frontal and insular region. Petechial blood products are present but there is no frank hematoma. Mild swelling but no shift. No other acute infarction.  Extensive chronic small vessel insults throughout the brain as outlined above.  MR angiography shows diffuse and widespread atherosclerotic narrowing and irregularity of the medium sized intracranial arteries. There are a few missing MCA branches on the left corresponding with the region of infarction. No correctable stenosis is seen.   Electronically Signed   By: Paulina Fusi M.D.   On: 10/22/2013 08:55   Mr Maxine Glenn Head/brain Wo Cm  10/22/2013   CLINICAL DATA:  Expressive aphasia.  EXAM: MRI HEAD WITHOUT CONTRAST  MRA HEAD WITHOUT CONTRAST  TECHNIQUE: Multiplanar, multiecho pulse sequences of the  brain and surrounding structures were obtained without intravenous contrast. Angiographic images of the head were obtained using MRA technique without contrast.  COMPARISON:  Head CT 10/21/2013  FINDINGS: MRI HEAD FINDINGS  There is a 4.8 cm in diameter region of acute infarction affecting the left posterior frontal and insular region. The brain shows mild swelling. There is a small amount of petechial bleeding but no measurable hematoma. No significant mass effect or shift however. No other region of acute infarction.  There are old small vessel infarctions within the pons, more notable on the right. The cerebellum shows atrophy with a few old small vessel infarctions. There are old small vessel infarctions affecting  the thalami and basal ganglia. There are old infarctions within the cerebral hemispheric white matter bilaterally. No other large vessel territory infarction. No mass lesion, hydrocephalus or extra-axial collection. No pituitary mass. No inflammatory sinus disease. No abnormality of the skull or skullbase region.  MRA HEAD FINDINGS  Both internal carotid arteries are patent through the skullbase. There is atherosclerotic narrowing in both carotid siphon regions but no focal flow-limiting stenosis. There is atherosclerotic narrowing and irregularity of the medium size vessels throughout the anterior and middle cerebral artery territories bilaterally. No evidence of a correctable proximal stenosis. There are a few missing MCA branches on the left corresponding to the region of acute infarction.  The left vertebral artery is the dominant vessel widely patent to the basilar. The right vertebral artery shows a stenosis 1.5 cm proximal to the basilar. The basilar artery shows mild narrowing and irregularity but no correctable stenosis. The superior cerebellar and posterior cerebral arteries are patent bilaterally but show atherosclerotic narrowing and irregularity.   IMPRESSION: 4.8 cm region of acute  infarction affecting the left posterior frontal and insular region. Petechial blood products are present but there is no frank hematoma. Mild swelling but no shift. No other acute infarction.  Extensive chronic small vessel insults throughout the brain as outlined above.  MR angiography shows diffuse and widespread atherosclerotic narrowing and irregularity of the medium sized intracranial arteries. There are a few missing MCA branches on the left corresponding with the region of infarction. No correctable stenosis is seen.     Electronically Signed   By: Paulina Fusi M.D.   On: 10/22/2013 08:55    CBC  Recent Labs Lab 10/21/13 1403  WBC 10.7*  HGB 11.9*  HCT 36.0  PLT 284  MCV 87.6  MCH 29.0  MCHC 33.1  RDW 14.0  LYMPHSABS 2.0  MONOABS 1.0  EOSABS 0.0  BASOSABS 0.0    Chemistries   Recent Labs Lab 10/21/13 1403 10/22/13 0626  NA 141 140  K 4.2 3.7  CL 102 102  CO2 24 22  GLUCOSE 335* 147*  BUN 18 16  CREATININE 0.85 0.81  CALCIUM 9.5 8.8  AST 14  --   ALT 7  --   ALKPHOS 102  --   BILITOT 1.1  --    ------------------------------------------------------------------------------------------------------------------ estimated creatinine clearance is 66.4 ml/min (by C-G formula based on Cr of 0.81). ------------------------------------------------------------------------------------------------------------------  Recent Labs  10/22/13 0626  HGBA1C 7.7*   ------------------------------------------------------------------------------------------------------------------  Recent Labs  10/22/13 0626  CHOL 131  HDL 36*  LDLCALC 67  TRIG 004  CHOLHDL 3.6   ------------------------------------------------------------------------------------------------------------------ No results found for this basename: TSH, T4TOTAL, FREET3, T3FREE, THYROIDAB,  in the last 72  hours ------------------------------------------------------------------------------------------------------------------ No results found for this basename: VITAMINB12, FOLATE, FERRITIN, TIBC, IRON, RETICCTPCT,  in the last 72 hours  Coagulation profile  Recent Labs Lab 10/21/13 1459  INR 1.05    No results found for this basename: DDIMER,  in the last 72 hours  Cardiac Enzymes No results found for this basename: CK, CKMB, TROPONINI, MYOGLOBIN,  in the last 168 hours ------------------------------------------------------------------------------------------------------------------ No components found with this basename: POCBNP,      Time Spent in minutes   35   Leroy Sea M.D on 10/24/2013 at 9:21 AM  Between 7am to 7pm - Pager - [phone]  After 7pm go to [email or web address] - password [email or web address]  And look for the night coverage person covering for me after hours  [facility] Office  [phone]   **  Disclaimer: This note may have been dictated with voice recognition software. Similar sounding words can inadvertently be transcribed and this note may contain transcription errors which may not have been corrected upon publication of note.**

## 2013-10-25 DIAGNOSIS — I6330 Cerebral infarction due to thrombosis of unspecified cerebral artery: Secondary | ICD-10-CM

## 2013-10-25 LAB — CBC
HCT: 32.3 % — ABNORMAL LOW (ref 36.0–46.0)
Hemoglobin: 10.7 g/dL — ABNORMAL LOW (ref 12.0–15.0)
MCH: 28.2 pg (ref 26.0–34.0)
MCHC: 33.1 g/dL (ref 30.0–36.0)
MCV: 85.2 fL (ref 78.0–100.0)
PLATELETS: 236 10*3/uL (ref 150–400)
RBC: 3.79 MIL/uL — ABNORMAL LOW (ref 3.87–5.11)
RDW: 13.5 % (ref 11.5–15.5)
WBC: 5.6 10*3/uL (ref 4.0–10.5)

## 2013-10-25 LAB — GLUCOSE, CAPILLARY
GLUCOSE-CAPILLARY: 192 mg/dL — AB (ref 70–99)
GLUCOSE-CAPILLARY: 269 mg/dL — AB (ref 70–99)
Glucose-Capillary: 156 mg/dL — ABNORMAL HIGH (ref 70–99)
Glucose-Capillary: 286 mg/dL — ABNORMAL HIGH (ref 70–99)

## 2013-10-25 MED ORDER — INSULIN ASPART 100 UNIT/ML ~~LOC~~ SOLN: SUBCUTANEOUS | Status: DC

## 2013-10-25 MED ORDER — APIXABAN 5 MG PO TABS: 5.0000 mg | ORAL_TABLET | Freq: Two times a day (BID) | ORAL | Status: DC

## 2013-10-25 MED ORDER — VANCOMYCIN HCL 10 G IV SOLR
1250.0000 mg | Freq: Two times a day (BID) | INTRAVENOUS | Status: DC
  Administered 2013-10-25 – 2013-10-27 (×4): 1250 mg via INTRAVENOUS
  Filled 2013-10-25 (×5): qty 1250

## 2013-10-25 NOTE — Progress Notes (Signed)
Inpatient Diabetes Program Recommendations  AACE/ADA: New Consensus Statement on Inpatient Glycemic Control (2013)  Target Ranges:  Prepandial:   less than 140 mg/dL      Peak postprandial:   less than 180 mg/dL (1-2 hours)      Critically ill patients:  140 - 180 mg/dL   Results for LONITA, DEBES (MRN [id number]) as of 10/25/2013 10:39  Ref. Range 10/24/2013 06:54 10/24/2013 11:45 10/24/2013 16:42 10/24/2013 21:31 10/25/2013 07:00  Glucose-Capillary Latest Range: 70-99 mg/dL 465 (H) 681 (H) 275 (H) 289 (H) 156 (H)   Diabetes history: DM2 Outpatient Diabetes medications: Lantus 50 units QHS, Novolin R 4 units (if CBG > 250 mg/dl), Metformin 170 mg BID Current orders for Inpatient glycemic control: Lantus 50 units QHS, Novolog 0-15 units AC, Novolog 0-5 units HS  Inpatient Diabetes Program Recommendations Insulin - Meal Coverage: Please consider ordering Novolog 6 units TID with meals for meal coverage if patient eats at least 50% of meals.  Thanks, Orlando Penner, RN, MSN, CCRN Diabetes Coordinator Inpatient Diabetes Program [phone] (Team Pager) [phone] (AP office) [phone] [facility] office)

## 2013-10-25 NOTE — Progress Notes (Signed)
I met with pt at bedside . Patient continues to be adament about wanting to go home. Pt lives alone and does not have 24/7 assist at home. I contacted her daughter, Gisella Alwine, by phone. Daughter is aware that pt is refusing SNF rehab as I recommend to her and daughter states she has MS and unable to assist her mother. I discussed with RN CM and SW. Pt is [employer]. [phone]

## 2013-10-25 NOTE — Progress Notes (Signed)
Speech Language Pathology Treatment: Cognitive-Linquistic  Patient Details Name: [name] [name] MRN: [id number] DOB: 19-Feb-1935 Today's Date: 10/25/2013 Time: 1445-1530 SLP Time Calculation (min): 45 min  Assessment / Plan / Recommendation Clinical Impression  Pt seen for treatment of receptive and expressive language deficits. Pt had visitors present during session. Pt noted to have improved in both receptive and expressive skills since last seen by SLP.  Receptively, pt continues to have difficulty with yes/no responses and following complex directions.  Expressively, pt was able to name common objects around her room with 70% accuracy, a significant improvement since eval.  Paraphasic errors are still present, however, are markedly reduced.  Perseveration is also noted to continue, but is less severe than on eval.  Social exchanges and responses to questions are more appropriate in context, or when visual/verbal/tactile cues are utilized.  Pt was given a communication board to facilitate effective communication with staff and visitors. This was reviewed with pt and company.   DC is apparently pending placement decisions.  At the present time, this speech pathologist recommends 24/7 supervision, largely due to the functional effect of her receptive and expressive language deficits.  Continued therapy is highly recommended as well, for remediation of aphasia, and continued pt/family education for compensatory strategies. Many options are available for pt, and will be discussed with her with case management.  This was thoroughly discussed with case management, pt, and friends (with pt permission).   HPI HPI: 79 year old female admitted 10/21/13 due to expressive aphasia. PMH significant for DM2, HTN, CAD. CT revealed acute-subacute posterior left frontal infarct and remote left thalamic lacunar infarct. SLE ordered to evaluate receptive and expressive language status.   Pertinent Vitals VSS  SLP Plan  Continue with current plan of care    Recommendations                Follow up Recommendations: 24 hour supervision/assistance Plan: Continue with current plan of care    GO   [name] [name] [facility], CCC-SLP [phone]  [name] [name] 10/25/2013, 3:44 PM

## 2013-10-25 NOTE — Progress Notes (Signed)
Occupational Therapy Treatment Patient Details Name: [name] [name] MRN: [id number] DOB: 1934/12/07 Today's Date: 10/25/2013    History of present illness 79 year old female admitted 10/21/13 due to expressive aphasia. PMH significant for DM2, HTN, CAD. CT revealed acute-subacute posterior left frontal infarct and remote left thalamic lacunar infarct.    OT comments  Pt demonstrates decreased safety and independence with self care tasks despite repeated cues during session. Feel she would benefit from post acute rehab to increased her safety and independence with her self care tasks. She does not have 24/7 assist available. Pt currently wavering about whether she would agree to CIR--but does not adamantly refuse CIR after discussing the safety concerns OT has with her in depth.    Follow Up Recommendations  CIR;Supervision/Assistance - 24 hour    Equipment Recommendations  Other (comment) (if pt does decide d/c home, recommend a tubseat if pt agreeable.)    Recommendations for Other Services      Precautions / Restrictions Precautions Precautions: Fall Restrictions Weight Bearing Restrictions: No       Mobility Bed Mobility                  Transfers Overall transfer level: Needs assistance Equipment used: Rolling walker (2 wheeled) Transfers: Sit to/from Stand Sit to Stand: Min guard         General transfer comment: verbal cues for hand placement. min assist for stand to sit as pt is impulsive and tries to sit prematurely.    Balance                                   ADL       Grooming: Wash/dry hands;Standing;Min guard (with verbal cues for safety with using walker to the sink)               Lower Body Dressing: Set up;Sitting/lateral leans (socks only)   Toilet Transfer: Minimal assistance;Ambulation;RW;Comfort height toilet;Grab bars           Functional mobility during ADLs: Minimal assistance;Rolling walker General ADL  Comments: Pt currently wavering about whether she wants to d/c home or to CIR. Early in session today, pt stating she wants to go home and take care of her animals. Later in session after OT discussed safety concerns with how pt is performing her ADL, pt stating she isnt sure if she is able to go home or not. When asked about going to rehab, she states "she may not approve." When asked who "she" is, pt states a friend of hers. OT inquired  why a friend would be concerned and pt states friend may be able to stay with her. Appears that pt doesnt have a confirmed 24/7 care plan in place and currently pt is min to min guard assist level with ADL. Therefore feel she will need post acute rehab at d/c to improve safety and independence with self care tasks. Pt with complaint of a sore tender spot on the top of her R hand. OT noted raised area on R hand and informed nursing. Pt attempted to sit down prematurely on toilet before fully backing up to the toielt with her walker so min assist needed to descend safely. She tried to pull up to standing with the walker despite a cue to use the grab bar. She also tends to push the walker aside and step around to the sink without it. SHe needed  a reminder to wash her hands after toileting. Asked about a tubseat or bench but pt declines at this time. She states she will sponge bathe initially. Pt having difficulty expressing ideas at times and was attempting to explain that the chair alarm would go off if she got up but instead was pointing to the buttons on the bed. When OT asked if she meant the chair alarm she said yes.       Vision                     Perception     Praxis      Cognition   Behavior During Therapy: Impulsive Overall Cognitive Status: Impaired/Different from baseline Area of Impairment: Safety/judgement;Problem solving;Awareness     Memory: Decreased short-term memory    Safety/Judgement: Decreased awareness of safety   Problem Solving:  Slow processing General Comments: Pt noted to have expressive aphasia but was able to indicate most of her needs and ideas during session. Pt is slow to process. She demonstrates decreased safety and impulsivity despite repeated cues.    Extremity/Trunk Assessment               Exercises     Shoulder Instructions       General Comments      Pertinent Vitals/ Pain       Some discomfort on top of R hand and note raised area; informed nursing.  Home Living                                          Prior Functioning/Environment              Frequency Min 2X/week     Progress Toward Goals  OT Goals(current goals can now be found in the care plan section)  Progress towards OT goals: Progressing toward goals     Plan Discharge plan remains appropriate    Co-evaluation                 End of Session Equipment Utilized During Treatment: Gait belt;Rolling walker   Activity Tolerance Patient tolerated treatment well   Patient Left in chair;with call bell/phone within reach;with chair alarm set   Nurse Communication          Time: 5027-7412 OT Time Calculation (min): 30 min  Charges: OT General Charges $OT Visit: 1 Procedure OT Treatments $Self Care/Home Management : 8-22 mins $Therapeutic Activity: 8-22 mins  Sabino Gasser Jordane Hisle [phone] 10/25/2013, 1:09 PM

## 2013-10-25 NOTE — Progress Notes (Signed)
Noted updated OT notes. Pt does not have 24/7 supervision at home which is recommended for home d/c from any rehab venue. Daughter unable to provide. We are recommending SNF at this time. [phone]

## 2013-10-25 NOTE — Care Management Note (Signed)
Page 1 of 2   10/27/2013     10:45:22 AM CARE MANAGEMENT NOTE 10/27/2013  Patient:  [name], [name]   Account Number:  [id number]  Date Initiated:  10/25/2013  Documentation initiated by:  Lorne Skeens  Subjective/Objective Assessment:   Patient was admitted with CVA. Lives at home alone.     Action/Plan:   Will follow for discharge needs pending PT/OT evals and physician orders   Anticipated DC Date:  10/25/2013   Anticipated DC Plan:  [place]  In-house referral  Clinical Social Worker      DC Planning Services  CM consult      Choice offered to / List presented to:  C-4 Adult Children        [place] arranged  HH-1 RN  [place].   Status of service:  Completed, signed off Medicare Important Message given?  YES (If response is "NO", the following Medicare IM given date fields will be blank) Date Medicare IM given:   Date Additional Medicare IM given:  10/25/2013  Discharge Disposition:  [place]  Per UR Regulation:  Reviewed for med. necessity/level of care/duration of stay  If discussed at [place] of Stay Meetings, dates discussed:    Comments:  10/27/13 [place], MSN, CM- Patient is now declining SNF placement. CM spoke with patient's daughter [name] [name], who confirms that they plan to have patient discharged home with home health.  Daughter has chosen [facility], who they have used for multiple family members in the past.  Lelan Pons with [facility] was notified and has accepted the referral for discharge home today.  Patient will be discharging to the address listed in the chart.  Please call daughter [name] [name] [phone] for appointments. Patient's friend Fraser Din will be transporting patient home later today.  Private duty agency list was left at bedside for patient's daughter.  RN, MD and CSW were updated.   10/25/13 [place], MSN, CM- Met again with patient while friends were at bedside.  PT/SLP also at bedside.  With the encouragement of her friends, patient has now agreed to SNF placement and admits to feeling unsafe (hgh fall risk) at home. CM contacted CSW to relay permission to fax patient out.  With permission, CM updated patient's daughter [name] [name].  Text page was sent to Dr Candiss Norse to provide update on discharge disposition.   10/25/13 [place], MSN, CM- Met with patient to discuss discharge planning.  Patient is currently refusing CIR/SNF, wants to go home with home health.  CM requested permission to speak with patient's daughter Levette Paulick to verify that appropriate family support was in place.  CM spoke with [name] [name] via phone, who states that she herself is disabled and unable to care for her mother at home.  She wishes for her mother to come home, but feels that she needs rehab prior for safety.  CM discussed what home health services were ordered as well as the option of private duty agencies.  Per daughter, private duty is not an option financially, and she does not feel that her mother is safe to go home without 24hr supervision.  Family is not able to provide this at this time.  Daughter plans to speak with her mother to see if she is willing to consider rehab prior to  going home.  CIR liason and CSW are aware that discharge disposition is undetermined at this time. CM will notify Dr Candiss Norse once final decision is made. Patient was provided with Medicare IM letter.

## 2013-10-25 NOTE — Progress Notes (Signed)
Physical Therapy Treatment Patient Details Name: [name] [name] MRN: [id number] DOB: [birth year] Today's Date: 10/25/2013    History of Present Illness 79 year old female admitted 10/21/13 due to expressive aphasia. PMH significant for DM2, HTN, CAD. CT revealed acute-subacute posterior left frontal infarct and remote left thalamic lacunar infarct.     PT Comments    Pt is progressing well with her mobility, but safety remains an issue.  Both her ability to be able to communicate her needs and her ability to problem solve and assess the safety of a situation are impaired.  She would continue to benefit from CIR level therapies at d/c for continued rehab.  I do not feel she is safe to go home alone at this time.    Follow Up Recommendations  CIR     Equipment Recommendations  None recommended by PT    Recommendations for Other Services   NA     Precautions / Restrictions Precautions Precautions: Fall Precaution Comments: pt unsteady on her feet, safety awareness issues, and difficulty communicating.  Restrictions Weight Bearing Restrictions: No    Mobility  Transfers Overall transfer level: Needs assistance Equipment used: Rolling walker (2 wheeled) Transfers: Sit to/from Stand Sit to Stand: Min assist         General transfer comment: verbal and tactile cues for safe hand placement.  Max verbal cues when going to sit back down as pt "parks" her RW a few feet from the chair.    Ambulation/Gait Ambulation/Gait assistance: Min guard Ambulation Distance (Feet): 180 Feet Assistive device: Rolling walker (2 wheeled) Gait Pattern/deviations: Step-through pattern;Staggering left;Staggering right Gait velocity: too fast to be safe due to impulsivity and imbalance Gait velocity interpretation: at or above normal speed for age/gender General Gait Details: Pt frequently stopping and letting go of RW without warning.  Verbal cues for safe use of RW, stay closer to RW for good  posture, slow down for safety.        Modified Rankin (Stroke Patients Only) Modified Rankin (Stroke Patients Only) Pre-Morbid Rankin Score: No symptoms Modified Rankin: Moderately severe disability     Balance Overall balance assessment: Needs assistance Sitting-balance support: Feet supported Sitting balance-Leahy Scale: Good     Standing balance support: Bilateral upper extremity supported;No upper extremity supported;Single extremity supported Standing balance-Leahy Scale: Fair Standing balance comment: reliance on hands for balance, can let go for brief periods, but then goes back to having hands stabilize her as she moves.                     Cognition Arousal/Alertness: Awake/alert Behavior During Therapy: Impulsive Overall Cognitive Status: Impaired/Different from baseline Area of Impairment: Safety/judgement;Awareness;Problem solving Orientation Level: Disoriented to;Person Current Attention Level: Sustained Memory: Decreased short-term memory   Safety/Judgement: Decreased awareness of deficits;Decreased awareness of safety Awareness: Emergent Problem Solving: Difficulty sequencing;Requires verbal cues General Comments: Pt unable to name her friend of give any indication of how she met her.  She can report some one answers appropriately, but others not.  Seems more than just expressive difficulty.     Exercises General Exercises - Upper Extremity Shoulder Flexion: AROM;Both;10 reps;Seated Elbow Flexion: AROM;Both;10 reps;Seated General Exercises - Lower Extremity Long Arc Quad: AROM;Both;10 reps;Seated Hip Flexion/Marching: AROM;Both;10 reps;Seated Toe Raises: AROM;Both;10 reps;Seated Heel Raises: AROM;Both;10 reps;Seated    General Comments General comments (skin integrity, edema, etc.): Pt with limited endurance.  Friends report she did not do much walking at baseline at home.  DOE 2/4 with gait  and seated exercises.  O2 sats and HR stable on RA.        Pertinent Vitals/Pain DOE 2/4 during mobility and seated exercises.  O2 sats and HR stable on RA.            PT Goals (current goals can now be found in the care plan section) Acute Rehab PT Goals Patient Stated Goal: To go home Progress towards PT goals: Progressing toward goals    Frequency  Min 4X/week    PT Plan Current plan remains appropriate       End of Session   Activity Tolerance: Patient limited by fatigue Patient left: in chair;with call bell/phone within reach;with chair alarm set;with family/visitor present     Time: 2671-2458 PT Time Calculation (min): 28 min  Charges:  $Gait Training: 8-22 mins $Therapeutic Exercise: 8-22 mins                      Gunda Maqueda B. [place], [place], DPT [phone]   10/25/2013, 4:11 PM

## 2013-10-25 NOTE — Clinical Social Work Note (Signed)
Referred to this CSW today for ?SNF. Chart reviewed and have spoken with RNCM- per PM&R note, patient plans to d/c home with [facility] [address] and DME-. CSW to sign off- please contact us if SW needs arise. Reece Levy, MSW, Theresia Majors [phone]

## 2013-10-25 NOTE — Consult Note (Signed)
Physical Medicine and Rehabilitation Consult Reason for Consult: CVA Referring Physician: [facility]   HPI: [name] [name] is a 79 y.o. right-handed female with history of diabetes mellitus peripheral neuropathy, hypertension, paroxysmal atrial flutter and coronary artery disease. Independent prior to admission living alone. Presented 10/21/2013 with expressive aphasia. MRI of the brain showed a 4.8 cm region of acute infarction affecting the left posterior frontal and insular region. Extensive chronic small vessel insults throughout the brain. MRA of the head without stenosis or occlusion. Echocardiogram with ejection fraction 65% grade 1 diastolic dysfunction. Carotid Dopplers with right 40-59% ICA stenosis. EKG normal sinus rhythm. Patient did not receive TPA. Neurology services consulted maintained on Eliquis for CVA prophylaxis. Patient is tolerating a regular consistency diet. Physical therapy evaluation completed 10/22/2013 with recommendations for physical medicine rehabilitation consult.   Review of Systems  Cardiovascular: Positive for palpitations.  Gastrointestinal:       GERD  Musculoskeletal: Positive for myalgias.  Psychiatric/Behavioral: Positive for depression.  All other systems reviewed and are negative.  Past Medical History  Diagnosis Date  . DM (diabetes mellitus)   . Paroxysmal atrial flutter   . Dyslipidemia   . CAD (coronary artery disease)     a. s/p prior PCI RCA with ISR req CBA;  b. 04/2013 Neg MV;  c. 05/2013 Cath: LM 20, LAD 30p, LCX 20p, OM1 90 small, RCA 42m, PDA 40-->Med Rx.  . Osteoarthritis   . Hypothyroidism     hx  . Rheumatoid arthritis(714.0)   . Obesity   . GERD (gastroesophageal reflux disease)   . Depression   . Shoulder fracture, left     Dr. Martha Clan   Past Surgical History  Procedure Laterality Date  . Rca stent placement  2000  . Restenosis with ptca placement  2005  . Cataract surgery    . Partial hysterectomy    .  Replacement total knee  2013    right  . Shoulder surgery  02/04/13  . Cardiac catheterization  06/11/2013  . Coronary angioplasty     Family History  Problem Relation Age of Onset  . Esophageal cancer Mother   . Diabetes Mother   . Cancer Mother     esophageal    Social History:  reports that she has never smoked. She has never used smokeless tobacco. She reports that she does not drink alcohol or use illicit drugs. Allergies:  Allergies  Allergen Reactions  . Oxycodone Nausea And Vomiting   Medications Prior to Admission  Medication Sig Dispense Refill  . albuterol (PROVENTIL HFA;VENTOLIN HFA) 108 (90 BASE) MCG/ACT inhaler Inhale 2 puffs into the lungs every 6 (six) hours as needed for wheezing or shortness of breath.  1 Inhaler  0  . amLODipine (NORVASC) 10 MG tablet Take 1 tablet (10 mg total) by mouth daily.  90 tablet  2  . aspirin 81 MG tablet Take by mouth daily.       Marland Kitchen atorvastatin (LIPITOR) 20 MG tablet Take 1 tablet (20 mg total) by mouth daily.  30 tablet  3  . cyclobenzaprine (FLEXERIL) 5 MG tablet Take 1 tablet (5 mg total) by mouth 3 (three) times daily as needed.  30 tablet  0  . ferrous sulfate 325 (65 FE) MG tablet Take 325 mg by mouth daily with breakfast.      . fluticasone (FLONASE) 50 MCG/ACT nasal spray Place 2 sprays into the nose daily.  16 g  3  . furosemide (LASIX) 40  MG tablet Take 1 tablet (40 mg total) by mouth 2 (two) times daily.  180 tablet  3  . gabapentin (NEURONTIN) 300 MG capsule Take 1 capsule (300 mg total) by mouth 3 (three) times daily.  270 capsule  3  . insulin glargine (LANTUS) 100 UNIT/ML injection Inject 0.5 mLs (50 Units total) into the skin at bedtime.  30 mL  3  . insulin regular (NOVOLIN R RELION) 100 units/mL injection INJECT  SUBCUTANEOUSLY 4  UNITS  IF  BLOOD  SUGAR  IS  GREATER  THAN  250  10 mL  6  . isosorbide mononitrate (ISMO,MONOKET) 10 MG tablet Take 1 tablet (10 mg total) by mouth 2 (two) times daily.  180 tablet  3  .  latanoprost (XALATAN) 0.005 % ophthalmic solution Place 1 drop into both eyes at bedtime.       Marland Kitchen levothyroxine (LEVOTHROID) 150 MCG tablet Take 1 tablet (150 mcg total) by mouth daily.  90 tablet  3  . lisinopril (PRINIVIL,ZESTRIL) 10 MG tablet Take 1 tablet (10 mg total) by mouth daily.  90 tablet  1  . meclizine (ANTIVERT) 25 MG tablet Take 1 tablet (25 mg total) by mouth 2 (two) times daily. As needed for dizziness or nausea  60 tablet  3  . metFORMIN (GLUCOPHAGE) 500 MG tablet Take 1 tablet (500 mg total) by mouth 2 (two) times daily.  180 tablet  3  . metoprolol tartrate (LOPRESSOR) 25 MG tablet Take 1 tablet (25 mg total) by mouth 2 (two) times daily as needed.  180 tablet  3  . nitroGLYCERIN (NITROSTAT) 0.4 MG SL tablet Place 1 tablet (0.4 mg total) under the tongue every 5 (five) minutes as needed.  25 tablet  3  . potassium chloride SA (K-DUR,KLOR-CON) 20 MEQ tablet Take 1 tablet (20 mEq total) by mouth 2 (two) times daily.  180 tablet  3  . traMADol (ULTRAM) 50 MG tablet Take 100 mg by mouth every 8 (eight) hours as needed for moderate pain.      Marland Kitchen omeprazole (PRILOSEC) 20 MG capsule Take 1 capsule (20 mg total) by mouth 2 (two) times daily.  180 capsule  3    Home: Home Living Family/patient expects to be discharged to:: Private residence Living Arrangements: Alone Available Help at Discharge: Available PRN/intermittently;Family Type of Home: House Home Access: Level entry Home Layout: One level Home Equipment: Cane - single point  Lives With: Alone  Functional History: Prior Function Level of Independence: Independent Comments: Pt's family in room and reports that pt was independent, however uses SPC at times.  Functional Status:  Mobility: Bed Mobility Overal bed mobility: Needs Assistance Bed Mobility: Sit to Supine;Supine to Sit Supine to sit: Min assist Sit to supine: Min assist General bed mobility comments: (A) for trunk control, increased time due to  weakness Transfers Overall transfer level: Needs assistance Equipment used: Rolling walker (2 wheeled) Transfers: Sit to/from Stand Sit to Stand: Min assist General transfer comment: cues for hand placement & technique.  Did not need (A) to achieve standing but (A) to control descent.   Ambulation/Gait Ambulation/Gait assistance: Min guard Ambulation Distance (Feet): 30 Feet Assistive device: Rolling walker (2 wheeled) Gait Pattern/deviations: Step-through pattern;Decreased stride length;Trunk flexed Gait velocity: decreased Gait velocity interpretation: Below normal speed for age/gender General Gait Details: Pt with improved gait quality today but still with decreased stride & needs cues for obstacle negotiation.      ADL: ADL Overall ADL's : Needs assistance/impaired Eating/Feeding:  Independent;Sitting Grooming: Wash/dry face;Brushing hair;Set up;Sitting Upper Body Bathing: Minimal assitance;Sitting Lower Body Bathing: Minimal assistance;Sit to/from stand Upper Body Dressing : Moderate assistance;Sitting Lower Body Dressing: Moderate assistance;Sit to/from stand Toilet Transfer: Minimal assistance;Ambulation;BSC;RW Tub/ Shower Transfer: Total assistance Functional mobility during ADLs: Minimal assistance;Rolling walker  Cognition: Cognition Overall Cognitive Status: Impaired/Different from baseline Orientation Level: Oriented to person;Disoriented to situation;Oriented to place;Disoriented to time Cognition Arousal/Alertness: Awake/alert Behavior During Therapy: Flat affect;Restless;Anxious Overall Cognitive Status: Impaired/Different from baseline Area of Impairment: Attention;Memory;Safety/judgement;Problem solving;Orientation Orientation Level: Disoriented to;Place;Time;Situation (answered when given options) Current Attention Level: Sustained Memory: Decreased short-term memory Safety/Judgement: Decreased awareness of safety Problem Solving: Slow processing General  Comments: Pt with expressive aphasia, complicating true cognitive assessment. However pt appears to have slow processing and is disoriented to time and situation. Pt also demonstrates perseveration, specifically with numbers.  Difficult to assess due to: Impaired communication (aphasia)  Blood pressure 150/56, pulse 66, temperature 99.8 F (37.7 C), temperature source Oral, resp. rate 18, height 5\' 3"  (1.6 m), weight 99.4 kg (219 lb 2.2 oz), SpO2 95.00%. Physical Exam  Constitutional: She appears well-developed.  79 year old female sitting at edge of bed  HENT:  Head: Normocephalic.  Eyes: EOM are normal.  Neck: Normal range of motion. Neck supple. No thyromegaly present.  Respiratory: Effort normal and breath sounds normal. No respiratory distress.  GI: Soft. Bowel sounds are normal. She exhibits no distension.  Neurological: She is alert.  She was able to state place, date of birth, age as well as her home address. She did follow simple commands. Appears to have expressive and receptive language deficits but can convey thoughts and intentions with extra time. Moves all 4's fairly equally. Senses pain in all 4's.   Skin: Skin is warm and dry.  Psychiatric: She has a normal mood and affect. Her behavior is normal.    Results for orders placed during the hospital encounter of 10/21/13 (from the past 24 hour(s))  GLUCOSE, CAPILLARY     Status: Abnormal   Collection Time    10/24/13  6:54 AM      Result Value Ref Range   Glucose-Capillary 183 (*) 70 - 99 mg/dL   Comment 1 Documented in Chart     Comment 2 Notify RN    GLUCOSE, CAPILLARY     Status: Abnormal   Collection Time    10/24/13 11:45 AM      Result Value Ref Range   Glucose-Capillary 256 (*) 70 - 99 mg/dL  GLUCOSE, CAPILLARY     Status: Abnormal   Collection Time    10/24/13  4:42 PM      Result Value Ref Range   Glucose-Capillary 208 (*) 70 - 99 mg/dL  CLOSTRIDIUM DIFFICILE BY PCR     Status: None   Collection Time     10/24/13  5:48 PM      Result Value Ref Range   C difficile by pcr NEGATIVE  NEGATIVE  GLUCOSE, CAPILLARY     Status: Abnormal   Collection Time    10/24/13  9:31 PM      Result Value Ref Range   Glucose-Capillary 289 (*) 70 - 99 mg/dL   Comment 1 Documented in Chart     Comment 2 Notify RN    CBC     Status: Abnormal   Collection Time    10/25/13  4:17 AM      Result Value Ref Range   WBC 5.6  4.0 - 10.5 K/uL   RBC  3.79 (*) 3.87 - 5.11 MIL/uL   Hemoglobin 10.7 (*) 12.0 - 15.0 g/dL   HCT 16.6 (*) 06.3 - 01.6 %   MCV 85.2  78.0 - 100.0 fL   MCH 28.2  26.0 - 34.0 pg   MCHC 33.1  30.0 - 36.0 g/dL   RDW 01.0  93.2 - 35.5 %   Platelets 236  150 - 400 K/uL   No results found.  Assessment/Plan: Diagnosis: left frontal CVA 1. Does the need for close, 24 hr/day medical supervision in concert with the patient's rehab needs make it unreasonable for this patient to be served in a less intensive setting? Potentially 2. Co-Morbidities requiring supervision/potential complications: cad, afib, htn 3. Due to bladder management, bowel management, safety, skin/wound care, disease management, medication administration, pain management and patient education, does the patient require 24 hr/day rehab nursing? Yes 4. Does the patient require coordinated care of a physician, rehab nurse, PT (1-2 hrs/day, 5 days/week), OT (1-2 hrs/day, 5 days/week) and SLP (1-2 hrs/day, 5 days/week) to address physical and functional deficits in the context of the above medical diagnosis(es)? Yes/potentially Addressing deficits in the following areas: balance, endurance, locomotion, strength, transferring, bowel/bladder control, bathing, dressing, feeding, grooming, toileting, cognition, speech, language, swallowing and psychosocial support 5. Can the patient actively participate in an intensive therapy program of at least 3 hrs of therapy per day at least 5 days per week? Yes 6. The potential for patient to make measurable  gains while on inpatient rehab is good and fair 7. Anticipated functional outcomes upon discharge from inpatient rehab are supervision  with PT, supervision with OT, supervision and min assist with SLP. 8. Estimated rehab length of stay to reach the above functional goals is: 7 days or less 9. Does the patient have adequate social supports to accommodate these discharge functional goals? Potentially 10. Anticipated D/C setting: Home 11. Anticipated post D/C treatments: HH therapy and Outpatient therapy 12. Overall Rehab/Functional Prognosis: excellent  RECOMMENDATIONS: This patient's condition is appropriate for continued rehabilitative care in the following setting: see below Patient has agreed to participate in recommended program. Potentially Note that insurance prior authorization may be required for reimbursement for recommended care.  Comment: Pt was pretty adamant that she prefers to go home. She is moving fairly well. If family is available and is in agreement that they can manage her at home, then she could return home with [facility] or outpt therapies.   Ranelle Oyster, MD, [facility] Physical Medicine & Rehabilitation     10/25/2013

## 2013-10-25 NOTE — Progress Notes (Signed)
RN receives call from lab concerning positive anaerobic bottle blood culture for gram cocci clusters at 1913 during shift change. NP Shorr on call notified at [phone] and new orders received at 1920. Reported off to incoming RN Sarah. Arabella Merles Nusaiba Guallpa RN.

## 2013-10-25 NOTE — Discharge Summary (Addendum)
[name] [name], is a 79 y.o. female  DOB 1935-01-23  MRN [id number].  Admission date:  10/21/2013  Admitting Physician  Jeralyn Bennett, MD  Discharge Date:  10/26/2013   Primary MD  [name], MD  Recommendations for primary care physician for things to follow:   Continue monitoring secondary risk factors for stroke   Admission Diagnosis  Atrial flutter [427.32] CAD (coronary artery disease) [414.00] Hypertension [401.9] Hyperglycemia [790.29] CVA (cerebral infarction) [434.91] Poorly controlled type 2 diabetes mellitus with circulatory disorder [250.70, 443.81, 459.9]   Discharge Diagnosis  Atrial flutter [427.32] CAD (coronary artery disease) [414.00] Hypertension [401.9] Hyperglycemia [790.29] CVA (cerebral infarction) [434.91] Poorly controlled type 2 diabetes mellitus with circulatory disorder [250.70, 443.81, 459.9]    Principal Problem:   CVA (cerebral infarction) Active Problems:   CAD (coronary artery disease)   Atrial flutter   Hypothyroidism   Hypertension   Hyperlipidemia   Poorly controlled type 2 diabetes mellitus with circulatory disorder      Past Medical History  Diagnosis Date  . DM (diabetes mellitus)   . Paroxysmal atrial flutter   . Dyslipidemia   . CAD (coronary artery disease)     a. s/p prior PCI RCA with ISR req CBA;  b. 04/2013 Neg MV;  c. 05/2013 Cath: LM 20, LAD 30p, LCX 20p, OM1 90 small, RCA 44m, PDA 40-->Med Rx.  . Osteoarthritis   . Hypothyroidism     hx  . Rheumatoid arthritis(714.0)   . Obesity   . GERD (gastroesophageal reflux disease)   . Depression   . Shoulder fracture, left     Dr. Martha Clan    Past Surgical History  Procedure Laterality Date  . Rca stent placement  2000  . Restenosis with ptca placement  2005  . Cataract surgery    . Partial  hysterectomy    . Replacement total knee  2013    right  . Shoulder surgery  02/04/13  . Cardiac catheterization  06/11/2013  . Coronary angioplasty       Discharge Condition: stable   Follow UP  Follow-up Information   Follow up with SETHI,PRAMODKUMAR P, MD. Schedule an appointment as soon as possible for a visit in 2 months.   Specialties:  Neurology, Radiology   Contact information:   [address] [place] [address] [phone]       Follow up with [name], MD. Schedule an appointment as soon as possible for a visit in 1 week.   Specialty:  Internal Medicine   Contact information:   [address] [place] [address] [phone]       Follow up with EARLY, TODD, MD. Schedule an appointment as soon as possible for a visit in 1 week.   Specialty:  Vascular Surgery   Contact information:   [address] [place] [address] [phone]         Discharge Instructions  and  Discharge Medications         Discharge Instructions  Diet - low sodium heart healthy    Complete by:  As directed      Discharge instructions    Complete by:  As directed   Follow with Primary MD [name], MD in 7 days   Get CBC, CMP,    by Primary MD next visit.    Activity: As tolerated with Full fall precautions use walker/cane & assistance as needed   Disposition Home    Diet: Heart Healthy Low Carb, feeding assistance and aspiration precautions.  For Heart failure patients - Check your Weight same time everyday, if you gain over 2 pounds, or you develop in leg swelling, experience more shortness of breath or chest pain, call your Primary MD immediately. Follow Cardiac Low Salt Diet and 1.8 lit/day fluid restriction.   On your next visit with her primary care physician please Get Medicines reviewed and adjusted.  Please request your Prim.MD to go over all Hospital Tests and Procedure/Radiological results at the  follow up, please get all Hospital records sent to your Prim MD by signing hospital release before you go home.   If you experience worsening of your admission symptoms, develop shortness of breath, life threatening emergency, suicidal or homicidal thoughts you must seek medical attention immediately by calling 911 or calling your MD immediately  if symptoms less severe.  You Must read complete instructions/literature along with all the possible adverse reactions/side effects for all the Medicines you take and that have been prescribed to you. Take any new Medicines after you have completely understood and accpet all the possible adverse reactions/side effects.   Do not drive and provide baby sitting services if your were admitted for syncope or siezures until you have seen by Primary MD or a Neurologist and advised to do so again.  Do not drive when taking Pain medications.    Do not take more than prescribed Pain, Sleep and Anxiety Medications  Special Instructions: If you have smoked or chewed Tobacco  in the last 2 yrs please stop smoking, stop any regular Alcohol  and or any Recreational drug use.  Wear Seat belts while driving.   Please note  You were cared for by a hospitalist during your hospital stay. If you have any questions about your discharge medications or the care you received while you were in the hospital after you are discharged, you can call the unit and asked to speak with the hospitalist on call if the hospitalist that took care of you is not available. Once you are discharged, your primary care physician will handle any further medical issues. Please note that NO REFILLS for any discharge medications will be authorized once you are discharged, as it is imperative that you return to your primary care physician (or establish a relationship with a primary care physician if you do not have one) for your aftercare needs so that they can reassess your need for medications and  monitor your lab values.  Follow with Primary MD [name], MD in 7 days   Get CBC, CMP,    by Primary MD next visit.    Activity: As tolerated with Full fall precautions use walker/cane & assistance as needed   Disposition Home    Diet: Heart Healthy Low Carb, feeding assistance and aspiration precautions.  For Heart failure patients - Check your Weight same time everyday, if you gain over 2 pounds, or you develop in leg swelling, experience more shortness of breath or chest pain, call your Primary MD immediately. Follow  Cardiac Low Salt Diet and 1.8 lit/day fluid restriction.   On your next visit with her primary care physician please Get Medicines reviewed and adjusted.  Please request your Prim.MD to go over all Hospital Tests and Procedure/Radiological results at the follow up, please get all Hospital records sent to your Prim MD by signing hospital release before you go home.   If you experience worsening of your admission symptoms, develop shortness of breath, life threatening emergency, suicidal or homicidal thoughts you must seek medical attention immediately by calling 911 or calling your MD immediately  if symptoms less severe.  You Must read complete instructions/literature along with all the possible adverse reactions/side effects for all the Medicines you take and that have been prescribed to you. Take any new Medicines after you have completely understood and accpet all the possible adverse reactions/side effects.   Do not drive and provide baby sitting services if your were admitted for syncope or siezures until you have seen by Primary MD or a Neurologist and advised to do so again.  Do not drive when taking Pain medications.    Do not take more than prescribed Pain, Sleep and Anxiety Medications  Special Instructions: If you have smoked or chewed Tobacco  in the last 2 yrs please stop smoking, stop any regular Alcohol  and or any Recreational drug  use.  Wear Seat belts while driving.   Please note  You were cared for by a hospitalist during your hospital stay. If you have any questions about your discharge medications or the care you received while you were in the hospital after you are discharged, you can call the unit and asked to speak with the hospitalist on call if the hospitalist that took care of you is not available. Once you are discharged, your primary care physician will handle any further medical issues. Please note that NO REFILLS for any discharge medications will be authorized once you are discharged, as it is imperative that you return to your primary care physician (or establish a relationship with a primary care physician if you do not have one) for your aftercare needs so that they can reassess your need for medications and monitor your lab values.     Increase activity slowly    Complete by:  As directed      Increase activity slowly    Complete by:  As directed             Medication List    STOP taking these medications       aspirin 81 MG tablet     insulin regular 100 units/mL injection  Commonly known as:  NOVOLIN R RELION      TAKE these medications       albuterol 108 (90 BASE) MCG/ACT inhaler  Commonly known as:  PROVENTIL HFA;VENTOLIN HFA  Inhale 2 puffs into the lungs every 6 (six) hours as needed for wheezing or shortness of breath.     amLODipine 10 MG tablet  Commonly known as:  NORVASC  Take 1 tablet (10 mg total) by mouth daily.     apixaban 5 MG Tabs tablet  Commonly known as:  ELIQUIS  Take 1 tablet (5 mg total) by mouth 2 (two) times daily.     atorvastatin 20 MG tablet  Commonly known as:  LIPITOR  Take 1 tablet (20 mg total) by mouth daily.     cyclobenzaprine 5 MG tablet  Commonly known as:  FLEXERIL  Take 1 tablet (5  mg total) by mouth 3 (three) times daily as needed.     ferrous sulfate 325 (65 FE) MG tablet  Take 325 mg by mouth daily with breakfast.      fluticasone 50 MCG/ACT nasal spray  Commonly known as:  FLONASE  Place 2 sprays into the nose daily.     furosemide 40 MG tablet  Commonly known as:  LASIX  Take 1 tablet (40 mg total) by mouth 2 (two) times daily.     gabapentin 300 MG capsule  Commonly known as:  NEURONTIN  Take 1 capsule (300 mg total) by mouth 3 (three) times daily.     insulin aspart 100 UNIT/ML injection  Commonly known as:  novoLOG  - Before each meal 3 times a day, 140-199 - 2 units, 200-250 - 4 units, 251-299 - 6 units,  300-349 - 8 units,  350 or above 10 units.  - Insulin PEN if approved, provide syringes and needles if needed.     insulin glargine 100 UNIT/ML injection  Commonly known as:  LANTUS  Inject 0.5 mLs (50 Units total) into the skin at bedtime.     isosorbide mononitrate 10 MG tablet  Commonly known as:  ISMO,MONOKET  Take 1 tablet (10 mg total) by mouth 2 (two) times daily.     latanoprost 0.005 % ophthalmic solution  Commonly known as:  XALATAN  Place 1 drop into both eyes at bedtime.     levothyroxine 150 MCG tablet  Commonly known as:  LEVOTHROID  Take 1 tablet (150 mcg total) by mouth daily.     lisinopril 10 MG tablet  Commonly known as:  PRINIVIL,ZESTRIL  Take 1 tablet (10 mg total) by mouth daily.     meclizine 25 MG tablet  Commonly known as:  ANTIVERT  Take 1 tablet (25 mg total) by mouth 2 (two) times daily. As needed for dizziness or nausea     metFORMIN 500 MG tablet  Commonly known as:  GLUCOPHAGE  Take 1 tablet (500 mg total) by mouth 2 (two) times daily.     metoprolol tartrate 25 MG tablet  Commonly known as:  LOPRESSOR  Take 1 tablet (25 mg total) by mouth 2 (two) times daily as needed.     nitroGLYCERIN 0.4 MG SL tablet  Commonly known as:  NITROSTAT  Place 1 tablet (0.4 mg total) under the tongue every 5 (five) minutes as needed.     omeprazole 20 MG capsule  Commonly known as:  PRILOSEC  Take 1 capsule (20 mg total) by mouth 2 (two) times daily.      potassium chloride SA 20 MEQ tablet  Commonly known as:  K-DUR,KLOR-CON  Take 1 tablet (20 mEq total) by mouth 2 (two) times daily.     traMADol 50 MG tablet  Commonly known as:  ULTRAM  Take 100 mg by mouth every 8 (eight) hours as needed for moderate pain.          Diet and Activity recommendation: See Discharge Instructions above   Consults obtained - Neuro   Major procedures and Radiology Reports - PLEASE review detailed and final reports for all details, in brief -    Carotids - Bilateral carotid artery duplex completed. Right: 40-59% internal carotid artery stenosis. Left: 1-39% ICA stenosis. Bilateral: Vertebral artery flow is antegrade.    Echo - - Left ventricle: The cavity size was normal. Wall thickness was increased in a pattern of mild LVH. Systolic function was normal. The estimated ejection  fraction was in the range of 60% to 65%. Wall motion was normal; there were no regional wall motion abnormalities. Doppler parameters are consistent with abnormal left ventricular relaxation (grade 1 diastolic dysfunction). The E/e&' ratio is >15, suggesting elevated LV filling pressure. - Aortic valve: Trileaflet. Sclerosis without stenosis. There was no regurgitation. - Mitral valve: Moderately calcified posterior annulus. Sclerotic anterior mitral leaflet tip. There was mild, posteriorly directed regurgitation. - Left atrium: Mildly dilated (area 24 cm2).  Impressions: LVEF 60-65%, mild LAE, diastolic dysfunction with elevated LV filling pressure, MAD with mild MR, sclerotic AV and MV.    Dg Chest 2 View  10/22/2013   CLINICAL DATA:  Stroke.  EXAM: CHEST  2 VIEW  COMPARISON:  05/21/2012  FINDINGS: The cardiac silhouette remains enlarged. The lungs are slightly less well inflated than on the prior study with minimal prominence of the central pulmonary vasculature. There is no evidence of acute airspace consolidation, overt pulmonary edema, pleural effusion, or pneumothorax. Focal  linear opacity in the left midlung is unchanged and may reflect scarring. No acute osseous abnormality is identified.  IMPRESSION: No active cardiopulmonary disease.   Electronically Signed   By: Sebastian Ache   On: 10/22/2013 08:29   Ct Head Wo Contrast  10/21/2013   CLINICAL DATA:  79 year old female with altered mental status.  EXAM: CT HEAD WITHOUT CONTRAST  TECHNIQUE: Contiguous axial images were obtained from the base of the skull through the vertex without intravenous contrast.  COMPARISON:  02/06/2009  FINDINGS: An infarct involving the posterior left frontal lobe is identified without hemorrhage - acute to subacute.  Mild chronic small-vessel white matter ischemic changes are present.  A remote lacunar infarct within the left thalamus is present.  There is no evidence of hydrocephalus, midline shift or extra-axial collection.  The bony calvarium is unremarkable.  IMPRESSION: Acute to subacute posterior left frontal lobe infarct without hemorrhage.  Chronic small-vessel white matter ischemic changes and remote left thalamic lacunar infarct.   Electronically Signed   By: Laveda Abbe M.D.   On: 10/21/2013 14:32   Mr Brain Wo Contrast  10/22/2013   CLINICAL DATA:  Expressive aphasia.  EXAM: MRI HEAD WITHOUT CONTRAST  MRA HEAD WITHOUT CONTRAST  TECHNIQUE: Multiplanar, multiecho pulse sequences of the brain and surrounding structures were obtained without intravenous contrast. Angiographic images of the head were obtained using MRA technique without contrast.  COMPARISON:  Head CT 10/21/2013  FINDINGS: MRI HEAD FINDINGS  There is a 4.8 cm in diameter region of acute infarction affecting the left posterior frontal and insular region. The brain shows mild swelling. There is a small amount of petechial bleeding but no measurable hematoma. No significant mass effect or shift however. No other region of acute infarction.  There are old small vessel infarctions within the pons, more notable on the right. The cerebellum  shows atrophy with a few old small vessel infarctions. There are old small vessel infarctions affecting the thalami and basal ganglia. There are old infarctions within the cerebral hemispheric white matter bilaterally. No other large vessel territory infarction. No mass lesion, hydrocephalus or extra-axial collection. No pituitary mass. No inflammatory sinus disease. No abnormality of the skull or skullbase region.  MRA HEAD FINDINGS  Both internal carotid arteries are patent through the skullbase. There is atherosclerotic narrowing in both carotid siphon regions but no focal flow-limiting stenosis. There is atherosclerotic narrowing and irregularity of the medium size vessels throughout the anterior and middle cerebral artery territories bilaterally. No evidence of  a correctable proximal stenosis. There are a few missing MCA branches on the left corresponding to the region of acute infarction.  The left vertebral artery is the dominant vessel widely patent to the basilar. The right vertebral artery shows a stenosis 1.5 cm proximal to the basilar. The basilar artery shows mild narrowing and irregularity but no correctable stenosis. The superior cerebellar and posterior cerebral arteries are patent bilaterally but show atherosclerotic narrowing and irregularity.  IMPRESSION: 4.8 cm region of acute infarction affecting the left posterior frontal and insular region. Petechial blood products are present but there is no frank hematoma. Mild swelling but no shift. No other acute infarction.  Extensive chronic small vessel insults throughout the brain as outlined above.  MR angiography shows diffuse and widespread atherosclerotic narrowing and irregularity of the medium sized intracranial arteries. There are a few missing MCA branches on the left corresponding with the region of infarction. No correctable stenosis is seen.   Electronically Signed   By: Paulina Fusi M.D.   On: 10/22/2013 08:55   Mr Maxine Glenn Head/brain Wo  Cm  10/22/2013   CLINICAL DATA:  Expressive aphasia.  EXAM: MRI HEAD WITHOUT CONTRAST  MRA HEAD WITHOUT CONTRAST  TECHNIQUE: Multiplanar, multiecho pulse sequences of the brain and surrounding structures were obtained without intravenous contrast. Angiographic images of the head were obtained using MRA technique without contrast.  COMPARISON:  Head CT 10/21/2013  FINDINGS: MRI HEAD FINDINGS  There is a 4.8 cm in diameter region of acute infarction affecting the left posterior frontal and insular region. The brain shows mild swelling. There is a small amount of petechial bleeding but no measurable hematoma. No significant mass effect or shift however. No other region of acute infarction.  There are old small vessel infarctions within the pons, more notable on the right. The cerebellum shows atrophy with a few old small vessel infarctions. There are old small vessel infarctions affecting the thalami and basal ganglia. There are old infarctions within the cerebral hemispheric white matter bilaterally. No other large vessel territory infarction. No mass lesion, hydrocephalus or extra-axial collection. No pituitary mass. No inflammatory sinus disease. No abnormality of the skull or skullbase region.  MRA HEAD FINDINGS  Both internal carotid arteries are patent through the skullbase. There is atherosclerotic narrowing in both carotid siphon regions but no focal flow-limiting stenosis. There is atherosclerotic narrowing and irregularity of the medium size vessels throughout the anterior and middle cerebral artery territories bilaterally. No evidence of a correctable proximal stenosis. There are a few missing MCA branches on the left corresponding to the region of acute infarction.  The left vertebral artery is the dominant vessel widely patent to the basilar. The right vertebral artery shows a stenosis 1.5 cm proximal to the basilar. The basilar artery shows mild narrowing and irregularity but no correctable stenosis. The  superior cerebellar and posterior cerebral arteries are patent bilaterally but show atherosclerotic narrowing and irregularity.  IMPRESSION: 4.8 cm region of acute infarction affecting the left posterior frontal and insular region. Petechial blood products are present but there is no frank hematoma. Mild swelling but no shift. No other acute infarction.  Extensive chronic small vessel insults throughout the brain as outlined above.  MR angiography shows diffuse and widespread atherosclerotic narrowing and irregularity of the medium sized intracranial arteries. There are a few missing MCA branches on the left corresponding with the region of infarction. No correctable stenosis is seen.   Electronically Signed   By: Scherrie Bateman.D.  On: 10/22/2013 08:55    Micro Results      Recent Results (from the past 240 hour(s))  CULTURE, BLOOD (ROUTINE X 2)     Status: None   Collection Time    10/24/13  3:00 PM      Result Value Ref Range Status   Specimen Description BLOOD LEFT ARM   Final   Special Requests BOTTLES DRAWN AEROBIC AND ANAEROBIC 10CC   Final   Culture  Setup Time     Final   Value: 10/24/2013 22:48     Performed at [facility]   Culture     Final   Value: GRAM POSITIVE COCCI IN CLUSTERS     Note: Gram Stain Report Called to,Read Back By and Verified With: T PRISCILLA KUR ON 10/25/2013 AT 7:15P BY WILEJ     Performed at [facility]   Report Status PENDING   Incomplete  CULTURE, BLOOD (ROUTINE X 2)     Status: None   Collection Time    10/24/13  3:10 PM      Result Value Ref Range Status   Specimen Description BLOOD RIGHT ARM   Final   Special Requests BOTTLES DRAWN AEROBIC AND ANAEROBIC 10CC   Final   Culture  Setup Time     Final   Value: 10/24/2013 22:48     Performed at [facility]   Culture     Final   Value:        BLOOD CULTURE RECEIVED NO GROWTH TO DATE CULTURE WILL BE HELD FOR 5 DAYS BEFORE ISSUING A FINAL NEGATIVE REPORT     Performed at  [facility]   Report Status PENDING   Incomplete  CLOSTRIDIUM DIFFICILE BY PCR     Status: None   Collection Time    10/24/13  5:48 PM      Result Value Ref Range Status   C difficile by pcr NEGATIVE  NEGATIVE Final     History of present illness and  Hospital Course:     Kindly see H&P for history of present illness and admission details, please review complete Labs, Consult reports and Test reports for all details in brief [name] [name], is a 79 y.o. female, patient with history of  type 2 diabetes mellitus, hypertension, paroxysmal atrial flutter, coronary artery disease, who was brought to the emergency room today by EMS. Patient unable to provide history and do to expressive aphasia. History obtained from emergency room staff and medical records. It is unknown when the patient was last seen in her usual state health. Her daughter called her mother today and found that she was not answering telephone, dry concerned called 911. EMS found her to be sitting at the table, unable to get words out, not following commands, and brought her to the emergency room. She was seen and evaluated by neurology in the emergency department. Was not felt to be candidate for TPA. CT scan of brain without contrast showed an acute to subacute posterior left frontal lobe infarct without hemorrhage.       1. Exp Aphasia due to L MCA territory ischemic CVA with some petechial hemorrhages. Switched from aspirin to liquids in the light of atrial fib - flutter, continue statin LDL is less than 100, A1c is 7.7, continue to adjust insulin for better control, Seen by PT OT and speech, neuro, CIR if patient agreeable.    2.CAD with history of atrial flutter. Chest pain-free, on Lopressor stable placed  on Eliquis.    3. Hypothyroidism. On Synthroid continue.     4. DM 2. A1c 8.3, on sliding scale along with Lantus longer sliding scale and home oral medications, A1c was 7.7, we'll request PCP to monitor  glycemic control closely post discharge in the outpatient setting.  Lab Results   Component  Value  Date    HGBA1C  7.7*  10/22/2013    CBG (last 3)   Recent Labs   10/23/13 1636  10/23/13 2142  10/24/13 0654   GLUCAP  306*  178*  183*       5. Hypertension. Continue home home meds at this point.    6. Carotid artery stenosis. Significant stenosis on the right side. Does not correlate with the site of stroke, we'll continue secondary prevention with Eliquis and insulin for glycemic control, will require outpatient vascular surgery followup.      Note during her initial hospitalization blood cultures were drawn he did one out of 2 bottles is growing gram-positive cocci, I think this is contamination from coag-negative staph. She has no signs of active infection. We'll request rehabilitation M.D. to continue monitoring final culture results. If there are any signs of infection to address appropriately. Again I am confident that this is skin contamination of the blood culture.       Today   Subjective:   [name] [name] today has no headache,no chest abdominal pain,no new weakness tingling or numbness, feels much better wants to go home today.   Objective:   Blood pressure 148/50, pulse 57, temperature 98.1 F (36.7 C), temperature source Oral, resp. rate 18, height 5\' 3"  (1.6 m), weight 99.4 kg (219 lb 2.2 oz), SpO2 96.00%.   Intake/Output Summary (Last 24 hours) at 10/26/13 1114 Last data filed at 10/26/13 0900  Gross per 24 hour  Intake    760 ml  Output      0 ml  Net    760 ml    Exam Awake Alert, Oriented x 3, No new F.N deficits, Normal affect, mild expressive aphasia [place].AT,PERRAL Supple Neck,No JVD, No cervical lymphadenopathy appriciated.  Symmetrical Chest wall movement, Good air movement bilaterally, CTAB RRR,No Gallops,Rubs or new Murmurs, No Parasternal Heave +ve B.Sounds, Abd Soft, Non tender, No organomegaly appriciated, No rebound -guarding or  rigidity. No Cyanosis, Clubbing or edema, No new Rash or bruise  Data Review   CBC w Diff: Lab Results  Component Value Date   WBC 5.6 10/25/2013   HGB 10.7* 10/25/2013   HCT 32.3* 10/25/2013   PLT 236 10/25/2013   LYMPHOPCT 19 10/21/2013   MONOPCT 9 10/21/2013   EOSPCT 0 10/21/2013   BASOPCT 0 10/21/2013    CMP: Lab Results  Component Value Date   NA 140 10/22/2013   K 3.7 10/22/2013   CL 102 10/22/2013   CO2 22 10/22/2013   BUN 16 10/22/2013   CREATININE 0.81 10/22/2013   PROT 7.3 10/21/2013   ALBUMIN 3.4* 10/21/2013   BILITOT 1.1 10/21/2013   ALKPHOS 102 10/21/2013   AST 14 10/21/2013   ALT 7 10/21/2013  . Lab Results  Component Value Date   HGBA1C 7.7* 10/22/2013    Lab Results  Component Value Date   CHOL 131 10/22/2013   HDL 36* 10/22/2013   LDLCALC 67 10/22/2013   LDLDIRECT 92.7 01/14/2013   TRIG 142 10/22/2013   CHOLHDL 3.6 10/22/2013     Total Time in preparing paper work, data evaluation and todays exam - 35 minutes  Leroy Sea M.D on 10/26/2013 at 11:14 AM  [facility] Office  [phone]   **Disclaimer: This note may have been dictated with voice recognition software. Similar sounding words can inadvertently be transcribed and this note may contain transcription errors which may not have been corrected upon publication of note.**

## 2013-10-25 NOTE — Progress Notes (Signed)
ANTIBIOTIC CONSULT NOTE - INITIAL  Pharmacy Consult for Vanco Indication: GPC in BC x 1  Allergies  Allergen Reactions  . Oxycodone Nausea And Vomiting    Patient Measurements: Height: 5\' 3"  (160 cm) Weight: 219 lb 2.2 oz (99.4 kg) IBW/kg (Calculated) : 52.4 Adjusted Body Weight:    Vital Signs: Temp: 98.2 F (36.8 C) (06/08 1733) Temp src: Oral (06/08 1733) BP: 152/76 mmHg (06/08 1733) Pulse Rate: 62 (06/08 1733) Intake/Output from previous day: 06/07 0701 - 06/08 0700 In: 240 [P.O.:240] Out: -  Intake/Output from this shift:    Labs:  Recent Labs  10/25/13 0417  WBC 5.6  HGB 10.7*  PLT 236   Estimated Creatinine Clearance: 66.4 ml/min (by C-G formula based on Cr of 0.81). No results found for this basename: VANCOTROUGH, VANCOPEAK, VANCORANDOM, GENTTROUGH, GENTPEAK, GENTRANDOM, TOBRATROUGH, TOBRAPEAK, TOBRARND, AMIKACINPEAK, AMIKACINTROU, AMIKACIN,  in the last 72 hours   Microbiology: Recent Results (from the past 720 hour(s))  CULTURE, URINE COMPREHENSIVE     Status: None   Collection Time    10/06/13  2:03 PM      Result Value Ref Range Status   Culture CITROBACTER KOSERI   Final   Colony Count >=100,000 COLONIES/ML   Final   Organism ID, Bacteria CITROBACTER KOSERI   Final  CULTURE, BLOOD (ROUTINE X 2)     Status: None   Collection Time    10/24/13  3:00 PM      Result Value Ref Range Status   Specimen Description BLOOD LEFT ARM   Final   Special Requests BOTTLES DRAWN AEROBIC AND ANAEROBIC 10CC   Final   Culture  Setup Time     Final   Value: 10/24/2013 22:48     Performed at 12/24/2013   Culture     Final   Value: GRAM POSITIVE COCCI IN CLUSTERS     Note: Gram Stain Report Called to,Read Back By and Verified With: T PRISCILLA KUR ON 10/25/2013 AT 7:15P BY WILEJ     Performed at 12/25/2013   Report Status PENDING   Incomplete  CULTURE, BLOOD (ROUTINE X 2)     Status: None   Collection Time    10/24/13  3:10 PM      Result  Value Ref Range Status   Specimen Description BLOOD RIGHT ARM   Final   Special Requests BOTTLES DRAWN AEROBIC AND ANAEROBIC 10CC   Final   Culture  Setup Time     Final   Value: 10/24/2013 22:48     Performed at 12/24/2013   Culture     Final   Value:        BLOOD CULTURE RECEIVED NO GROWTH TO DATE CULTURE WILL BE HELD FOR 5 DAYS BEFORE ISSUING A FINAL NEGATIVE REPORT     Performed at [facility]   Report Status PENDING   Incomplete  CLOSTRIDIUM DIFFICILE BY PCR     Status: None   Collection Time    10/24/13  5:48 PM      Result Value Ref Range Status   C difficile by pcr NEGATIVE  NEGATIVE Final    Medical History: Past Medical History  Diagnosis Date  . DM (diabetes mellitus)   . Paroxysmal atrial flutter   . Dyslipidemia   . CAD (coronary artery disease)     a. s/p prior PCI RCA with ISR req CBA;  b. 04/2013 Neg MV;  c. 05/2013 Cath: LM 20, LAD 30p, LCX  20p, OM1 90 small, RCA 72m, PDA 40-->Med Rx.  . Osteoarthritis   . Hypothyroidism     hx  . Rheumatoid arthritis(714.0)   . Obesity   . GERD (gastroesophageal reflux disease)   . Depression   . Shoulder fracture, left     Dr. Martha Clan    Medications:  Prescriptions prior to admission  Medication Sig Dispense Refill  . albuterol (PROVENTIL HFA;VENTOLIN HFA) 108 (90 BASE) MCG/ACT inhaler Inhale 2 puffs into the lungs every 6 (six) hours as needed for wheezing or shortness of breath.  1 Inhaler  0  . amLODipine (NORVASC) 10 MG tablet Take 1 tablet (10 mg total) by mouth daily.  90 tablet  2  . aspirin 81 MG tablet Take by mouth daily.       Marland Kitchen atorvastatin (LIPITOR) 20 MG tablet Take 1 tablet (20 mg total) by mouth daily.  30 tablet  3  . cyclobenzaprine (FLEXERIL) 5 MG tablet Take 1 tablet (5 mg total) by mouth 3 (three) times daily as needed.  30 tablet  0  . ferrous sulfate 325 (65 FE) MG tablet Take 325 mg by mouth daily with breakfast.      . fluticasone (FLONASE) 50 MCG/ACT nasal spray Place 2  sprays into the nose daily.  16 g  3  . furosemide (LASIX) 40 MG tablet Take 1 tablet (40 mg total) by mouth 2 (two) times daily.  180 tablet  3  . gabapentin (NEURONTIN) 300 MG capsule Take 1 capsule (300 mg total) by mouth 3 (three) times daily.  270 capsule  3  . insulin glargine (LANTUS) 100 UNIT/ML injection Inject 0.5 mLs (50 Units total) into the skin at bedtime.  30 mL  3  . insulin regular (NOVOLIN R RELION) 100 units/mL injection INJECT  SUBCUTANEOUSLY 4  UNITS  IF  BLOOD  SUGAR  IS  GREATER  THAN  250  10 mL  6  . isosorbide mononitrate (ISMO,MONOKET) 10 MG tablet Take 1 tablet (10 mg total) by mouth 2 (two) times daily.  180 tablet  3  . latanoprost (XALATAN) 0.005 % ophthalmic solution Place 1 drop into both eyes at bedtime.       Marland Kitchen levothyroxine (LEVOTHROID) 150 MCG tablet Take 1 tablet (150 mcg total) by mouth daily.  90 tablet  3  . lisinopril (PRINIVIL,ZESTRIL) 10 MG tablet Take 1 tablet (10 mg total) by mouth daily.  90 tablet  1  . meclizine (ANTIVERT) 25 MG tablet Take 1 tablet (25 mg total) by mouth 2 (two) times daily. As needed for dizziness or nausea  60 tablet  3  . metFORMIN (GLUCOPHAGE) 500 MG tablet Take 1 tablet (500 mg total) by mouth 2 (two) times daily.  180 tablet  3  . metoprolol tartrate (LOPRESSOR) 25 MG tablet Take 1 tablet (25 mg total) by mouth 2 (two) times daily as needed.  180 tablet  3  . nitroGLYCERIN (NITROSTAT) 0.4 MG SL tablet Place 1 tablet (0.4 mg total) under the tongue every 5 (five) minutes as needed.  25 tablet  3  . potassium chloride SA (K-DUR,KLOR-CON) 20 MEQ tablet Take 1 tablet (20 mEq total) by mouth 2 (two) times daily.  180 tablet  3  . traMADol (ULTRAM) 50 MG tablet Take 100 mg by mouth every 8 (eight) hours as needed for moderate pain.      Marland Kitchen omeprazole (PRILOSEC) 20 MG capsule Take 1 capsule (20 mg total) by mouth 2 (two) times daily.  180 capsule  3   Assessment: CVA 79 y/o F with L MCA territory ischemic CVA with some petechial  hemorrhages. Now on Eliquis for afib/flutter. Noted growing GPC in BC x 1.  Labs: WBC 5.6, Scr 0.81, CrCl 66  Goal of Therapy:  Vancomycin trough level 15-20 mcg/ml  Plan:  Start Vancomycin 1250mg  IV q12hrs. Vancomycin trough at steady state after 3-5 doses.  Amry Cathy S. , PharmD, [facility] Clinical Staff Pharmacist Pager [phone]  [phone] Carollee Massed 10/25/2013,7:48 PM

## 2013-10-26 LAB — GLUCOSE, CAPILLARY
GLUCOSE-CAPILLARY: 231 mg/dL — AB (ref 70–99)
Glucose-Capillary: 129 mg/dL — ABNORMAL HIGH (ref 70–99)
Glucose-Capillary: 189 mg/dL — ABNORMAL HIGH (ref 70–99)

## 2013-10-26 NOTE — Clinical Social Work Psychosocial (Signed)
Clinical Social Work Department BRIEF PSYCHOSOCIAL ASSESSMENT 10/26/2013  Patient:  [name] [name], [name] [name]     Account Number:  [id number]     Admit date:  10/21/2013  Clinical Social Worker:  Daiva Huge  Date/Time:  10/26/2013 11:01 AM  Referred by:  Physician  Date Referred:  10/26/2013 Referred for  SNF Placement   Other Referral:   Interview type:  Patient Other interview type:    PSYCHOSOCIAL DATA Living Status:  ALONE Admitted from facility:   Level of care:   Primary support name:  DAUGHTER AND SON Primary support relationship to patient:  FAMILY Degree of support available:   GOOD BUT NOT ABLE TO PROVIDE 24 HR CARE    CURRENT CONCERNS Current Concerns  Post-Acute Placement   Other Concerns:    SOCIAL WORK ASSESSMENT / PLAN Met with patient to discuss SNF placement with her- she is very resistant to the idea of going to a SNF for rehab- she is concerned about who will take care of her pets (2 dogs and bird). CS spoke with paitent along with SLP and attempted to encourage her to consider the SNF option for a short stay before going home- at this time, she is indecisive and not committed but agreeable to SNF search and for me to speak with her family -  Patient became tearful while talking about her pets- she seems overly focused on their not being cared for in her absence- CSW and SLP reassured her arrangements could be made with family assistance as well as the concern that if she is unable to care for hersself she will be unable to care for them also.   Assessment/plan status:  Other - See comment Other assessment/ plan:   Information/referral to community resources:   SNF list    PATIENT'S/FAMILY'S RESPONSE TO PLAN OF CARE: Patient uncertain if she will agree to SNF at d/c- CSW is pursuing options and will present to her- her family reports to [place] that they cannot provide 24 hour care and are planning to have more conversation with her about SNF-    Eduard Clos, MSW, [place]

## 2013-10-26 NOTE — Clinical Social Work Placement (Signed)
Social Work Department CLINICAL SOCIAL WORK PLACEMENT NOTE 10/26/2013  Patient:  [name] [name], [name] [name]  Account Number:  [id number] Admit date:  10/21/2013  Clinical Social Worker:  Robin Searing  Date/time:  10/26/2013 11:12 AM  Clinical Social Work is seeking post-discharge placement for this patient at the following level of care:   SKILLED NURSING   (*CSW will update this form in Epic as items are completed)   10/26/2013  Patient/family provided with [facility] Department of Clinical Social Work's list of facilities offering this level of care within the geographic area requested by the patient (or if unable, by the patient's family).  10/26/2013  Patient/family informed of their freedom to choose among providers that offer the needed level of care, that participate in Medicare, Medicaid or managed care program needed by the patient, have an available bed and are willing to accept the patient.  10/26/2013  Patient/family informed of [facility]' ownership interest in [facility], as well as of the fact that they are under no obligation to receive care at this facility.  PASARR submitted to EDS on 10/26/2013 PASARR number received on 10/26/2013  FL2 transmitted to all facilities in geographic area requested by pt/family on  10/26/2013 FL2 transmitted to all facilities within larger geographic area on   Patient informed that his/her managed care company has contracts with or will negotiate with  certain facilities, including the following:     Patient/family informed of bed offers received:   Patient chooses bed at  Physician recommends and patient chooses bed at    Patient to be transferred to  on   Patient to be transferred to facility by  Patient and family notified of transfer on  Name of family member notified:    The following physician request were entered in Epic:   Additional Comments: Reece Levy, MSW, [place] [phone]

## 2013-10-26 NOTE — Progress Notes (Signed)
Speech Language Pathology Treatment: Cognitive-Linquistic  Patient Details Name: NAZARIA RIESEN MRN: [id number] DOB: 30-Jul-1934 Today's Date: 10/26/2013 Time: 5597-4163 SLP Time Calculation (min): 31 min  Assessment / Plan / Recommendation Clinical Impression   F/u for aphasia: Pt continues with receptive and expressive deficits, with impaired insight into how communication impacts safety and independence.  Verbalizing desire to go home; discussed benefits of rehab to maximize independence and safety.  Naming to confrontation with 70% accuracy for line drawings; requires phonemic cues for word-retrieval.  Expression marked by heavy use of functor words with fewer nouns/verbs carrying substantive meaning.  Comprehension impairments require slower rate of speaking with use of gesture and emphasis of important concepts in writing (mod assist overall).  Pt emotional today; provided encouragement re: communication and benefit of ongoing rehab.      HPI HPI: 79 year old female admitted 10/21/13 due to expressive aphasia. PMH significant for DM2, HTN, CAD. CT revealed acute-subacute posterior left frontal infarct and remote left thalamic lacunar infarct. SLE ordered to evaluate receptive and expressive language status.      SLP Plan  Continue with current plan of care    Recommendations               Follow up Recommendations: 24 hour supervision/assistance Plan: Continue with current plan of care    GO     Carolan Shiver 10/26/2013, 11:30 AM

## 2013-10-26 NOTE — Progress Notes (Signed)
Inpatient Diabetes Program Recommendations  AACE/ADA: New Consensus Statement on Inpatient Glycemic Control (2013)  Target Ranges:  Prepandial:   less than 140 mg/dL      Peak postprandial:   less than 180 mg/dL (1-2 hours)      Critically ill patients:  140 - 180 mg/dL   Results for [name] [name], [name] [name] (MRN [id number]) as of 10/26/2013 11:39  Ref. Range 10/25/2013 07:00 10/25/2013 11:38 10/25/2013 16:48 10/25/2013 21:32 10/26/2013 06:18  Glucose-Capillary Latest Range: 70-99 mg/dL 021 (H) 115 (H) 520 (H) 286 (H) 129 (H)   Diabetes history: DM2  Outpatient Diabetes medications: Lantus 50 units QHS, Novolin R 4 units (if CBG > 250 mg/dl), Metformin 802 mg BID  Current orders for Inpatient glycemic control: Lantus 50 units QHS, Novolog 0-15 units AC, Novolog 0-5 units HS  Inpatient Diabetes Program Recommendations Insulin - Meal Coverage: Post prandial glucose consistently elevated. Please consider ordering Novolog 6 units TID with meals for meal coverage if patient eats at least 50% of meals.  Thanks, Orlando Penner, RN, MSN, CCRN Diabetes Coordinator Inpatient Diabetes Program [phone] (Team Pager) [phone] (AP office) [phone] [facility] office)

## 2013-10-26 NOTE — Clinical Social Work Note (Signed)
CSW is working to secure SNF options for patient and family to consider, however, patient remains resistant to this plan- CSW is also attempting to get a PASARR # for patient as there is a discrepancy in her SS# on file and PASARR customer support is indicating they are closed.... Will continue to work on this and advise.  Reece Levy, MSW, [facility] Majors [phone]

## 2013-10-26 NOTE — Progress Notes (Signed)
Physical Therapy Treatment Patient Details Name: [name] [name] MRN: [id number] DOB: 06/11/1934 Today's Date: 10/26/2013    History of Present Illness 79 year old female admitted 10/21/13 due to expressive aphasia. PMH significant for DM2, HTN, CAD. CT revealed acute-subacute posterior left frontal infarct and remote left thalamic lacunar infarct.     PT Comments    Pt continues to progress daily with her mobility, but her safety awareness and decreased ability to accurately communicate her needs make her a significant safety concern for d/c home.  I continue to endorse that she will need some short term rehab to get stronger and work more aggressively with therapy before d/c ing home alone.  PT will continue to follow acutely.   Follow Up Recommendations  CIR     Equipment Recommendations  None recommended by PT    Recommendations for Other Services   NA     Precautions / Restrictions Precautions Precautions: Fall Precaution Comments: pt unsteady on her feet, safety awareness issues, and difficulty communicating.     Mobility  Bed Mobility   Bed Mobility: Supine to Sit     Supine to sit: Modified independent (Device/Increase time)     General bed mobility comments: Pt got up to EOB quickly and easily  Transfers Overall transfer level: Needs assistance Equipment used: Rolling walker (2 wheeled);None Transfers: Sit to/from Stand Sit to Stand: Min guard         General transfer comment: Min guard assist for safety, verbal cues for safe technique  Ambulation/Gait Ambulation/Gait assistance: Min guard;Min assist Ambulation Distance (Feet): 200 Feet Assistive device: Rolling walker (2 wheeled);None Gait Pattern/deviations: Step-through pattern;Antalgic     General Gait Details: Attempted short distance gait to the bathroom with no assistive device and pt with very antalgic staggering gait pattern.  She is at high risk for falls and required min assist without  assistive device.  With RW only min guard assist when distracted and turning lose of RW.  Pt reports she likely would not use a RW at home at discharge, again, showing poor awareness of her deficits and safety.    Stairs Stairs: Yes Stairs assistance: Min assist Stair Management: One rail Right;Step to pattern;Forwards Number of Stairs: 3 General stair comments: leading with right leg up the stairs.  Step to pattern like she did PTA.  Railing needed for support.  Min assist on other hand for support and balance during transitions to power up the stairs.       Modified Rankin (Stroke Patients Only) Modified Rankin (Stroke Patients Only) Pre-Morbid Rankin Score: No symptoms Modified Rankin: Moderately severe disability     Balance Overall balance assessment: Needs assistance Sitting-balance support: Feet supported;No upper extremity supported Sitting balance-Leahy Scale: Good Sitting balance - Comments: pt able to donn both socks with figur 4 position seated EOB.    Standing balance support: Bilateral upper extremity supported;Single extremity supported Standing balance-Leahy Scale: Fair                      Cognition Arousal/Alertness: Awake/alert Behavior During Therapy: Impulsive Overall Cognitive Status: Impaired/Different from baseline Area of Impairment: Safety/judgement;Awareness;Problem solving   Current Attention Level: Sustained Memory: Decreased short-term memory   Safety/Judgement: Decreased awareness of safety;Decreased awareness of deficits Awareness: Emergent Problem Solving: Difficulty sequencing;Requires verbal cues;Requires tactile cues General Comments: Pt continues to have decreased safety awarness, easily distracted in the hallway.            Pertinent Vitals/Pain See vitals flow  sheet.            PT Goals (current goals can now be found in the care plan section) Acute Rehab PT Goals Patient Stated Goal: To go home Progress towards PT  goals: Progressing toward goals    Frequency  Min 4X/week    PT Plan Current plan remains appropriate       End of Session   Activity Tolerance: Patient limited by fatigue Patient left: in chair;with call bell/phone within reach;with chair alarm set     Time: 1530-1600 PT Time Calculation (min): 30 min  Charges:  $Gait Training: 23-37 mins                      Delene Morais B. Jadalynn Burr, PT, DPT [phone]   10/26/2013, 4:09 PM

## 2013-10-27 DIAGNOSIS — I4892 Unspecified atrial flutter: Secondary | ICD-10-CM | POA: Diagnosis not present

## 2013-10-27 DIAGNOSIS — I6350 Cerebral infarction due to unspecified occlusion or stenosis of unspecified cerebral artery: Secondary | ICD-10-CM | POA: Diagnosis not present

## 2013-10-27 LAB — CULTURE, BLOOD (ROUTINE X 2)

## 2013-10-27 LAB — GLUCOSE, CAPILLARY
GLUCOSE-CAPILLARY: 242 mg/dL — AB (ref 70–99)
Glucose-Capillary: 126 mg/dL — ABNORMAL HIGH (ref 70–99)
Glucose-Capillary: 157 mg/dL — ABNORMAL HIGH (ref 70–99)

## 2013-10-27 MED ORDER — AMLODIPINE BESYLATE 10 MG PO TABS: 10.0000 mg | ORAL_TABLET | Freq: Every day | ORAL | Status: DC

## 2013-10-27 MED ORDER — APIXABAN 5 MG PO TABS: 5.0000 mg | ORAL_TABLET | Freq: Two times a day (BID) | ORAL | Status: DC

## 2013-10-27 MED ORDER — ATORVASTATIN CALCIUM 20 MG PO TABS: 20.0000 mg | ORAL_TABLET | Freq: Every day | ORAL | Status: DC

## 2013-10-27 MED ORDER — METOPROLOL TARTRATE 25 MG PO TABS
25.0000 mg | ORAL_TABLET | Freq: Two times a day (BID) | ORAL | Status: DC | PRN
Start: 2013-10-27 — End: 2014-06-26

## 2013-10-27 MED ORDER — INSULIN ASPART 100 UNIT/ML ~~LOC~~ SOLN: SUBCUTANEOUS | Status: DC

## 2013-10-27 NOTE — Clinical Social Work Note (Signed)
Patient continues to decline SNF and family has spoke with her over night to no avail-  Patient refusing SNF and per daughter, they will take her home. MD and RNCM advised and to arrange home health needs. CSW to sign off at this time.  [name] [name], MSW, Theresia Majors [phone]

## 2013-10-27 NOTE — Progress Notes (Signed)
[facility] PROGRESS NOTE  [name] [name] OMB:[id number] DOB: 04-Sep-1934 DOA: 10/21/2013 PCP: [name], MD  Assessment/Plan: Pls see d/c summary for details   79 y.o. female, patient with history of type 2 diabetes mellitus, hypertension, paroxysmal atrial flutter, coronary artery disease, who was brought to the emergency room today by EMS. Patient unable to provide history and do to expressive aphasia. History obtained from emergency room staff and medical records. It is unknown when the patient was last seen in her usual state health. Her daughter called her mother today and found that she was not answering telephone, dry concerned called 911. EMS found her to be sitting at the table, unable to get words out, not following commands, and brought her to the emergency room. She was seen and evaluated by neurology in the emergency department. Was not felt to be candidate for TPA. CT scan of brain without contrast showed an acute to subacute posterior left frontal lobe infarct without hemorrhage.   1. Exp Aphasia due to L MCA territory ischemic CVA with some petechial hemorrhages. atrial fib - flutter started apixaban,  continue statin LDL is less than 100, A1c is 7.7, continue to adjust insulin for better control, Seen by PT OT and speech, neuro, CIR   But Patient declined refused;  -arranging HHC;  2.CAD with history of atrial flutter. Chest pain-free, on Lopressor stable placed on Eliquis.  3. Hypothyroidism. On Synthroid continue.  4. DM 2. A1c 8.3, on sliding scale along with Lantus longer sliding scale and home oral medications, A1c was 7.7, we'll request PCP to monitor glycemic control closely post discharge in the outpatient setting.  5. Hypertension. Continue home home meds at this point.  6. Carotid artery stenosis. Significant stenosis on the right side. Does not correlate with the site of stroke, we'll continue secondary prevention with Eliquis and insulin for glycemic  control, will require outpatient vascular surgery followup. D/w patient to f/u with vascular as outpatient in 1 -2 weeks    Note during her initial hospitalization blood cultures were drawn he did one out of 2 bottles is growing gram-positive cocci, I think this is contamination from coag-negative staph. She has no signs of active infection.  -outpatient f/u with PCP   Code Status: full Family Communication:  D/w patient (indicate person spoken with, relationship, and if by phone, the number) Disposition Plan: HHC   Consultants:  Neurology   Procedures:  none  Antibiotics:  none (indicate start date, and stop date if known)  HPI/Subjective: alert  Objective: Filed Vitals:   10/27/13 1406  BP: 155/49  Pulse:   Temp: 98 F (36.7 C)  Resp: 18    Intake/Output Summary (Last 24 hours) at 10/27/13 1521 Last data filed at 10/27/13 4536  Gross per 24 hour  Intake    480 ml  Output      0 ml  Net    480 ml   Filed Weights   10/21/13 1635  Weight: 99.4 kg (219 lb 2.2 oz)    Exam:   General:  alert  Cardiovascular: s1,s2 rrr  Respiratory: CTA BL  Abdomen: soft, nt,nd   Musculoskeletal: no LE edema3   Data Reviewed: Basic Metabolic Panel:  Recent Labs Lab 10/21/13 1403 10/22/13 0626  NA 141 140  K 4.2 3.7  CL 102 102  CO2 24 22  GLUCOSE 335* 147*  BUN 18 16  CREATININE 0.85 0.81  CALCIUM 9.5 8.8   Liver Function Tests:  Recent Labs Lab 10/21/13  1403  AST 14  ALT 7  ALKPHOS 102  BILITOT 1.1  PROT 7.3  ALBUMIN 3.4*   No results found for this basename: LIPASE, AMYLASE,  in the last 168 hours No results found for this basename: AMMONIA,  in the last 168 hours CBC:  Recent Labs Lab 10/21/13 1403 10/25/13 0417  WBC 10.7* 5.6  NEUTROABS 7.7  --   HGB 11.9* 10.7*  HCT 36.0 32.3*  MCV 87.6 85.2  PLT 284 236   Cardiac Enzymes: No results found for this basename: CKTOTAL, CKMB, CKMBINDEX, TROPONINI,  in the last 168 hours BNP (last  3 results) No results found for this basename: PROBNP,  in the last 8760 hours CBG:  Recent Labs Lab 10/26/13 1233 10/26/13 1623 10/26/13 2125 10/27/13 0627 10/27/13 1214  GLUCAP 189* 242* 231* 126* 157*    Recent Results (from the past 240 hour(s))  CULTURE, BLOOD (ROUTINE X 2)     Status: None   Collection Time    10/24/13  3:00 PM      Result Value Ref Range Status   Specimen Description BLOOD LEFT ARM   Final   Special Requests BOTTLES DRAWN AEROBIC AND ANAEROBIC 10CC   Final   Culture  Setup Time     Final   Value: 10/24/2013 22:48     Performed at [facility]   Culture     Final   Value: STAPHYLOCOCCUS SPECIES (COAGULASE NEGATIVE)     Note: THE SIGNIFICANCE OF ISOLATING THIS ORGANISM FROM A SINGLE SET OF BLOOD CULTURES WHEN MULTIPLE SETS ARE DRAWN IS UNCERTAIN. PLEASE NOTIFY THE MICROBIOLOGY DEPARTMENT WITHIN ONE WEEK IF SPECIATION AND SENSITIVITIES ARE REQUIRED.     Note: Gram Stain Report Called to,Read Back By and Verified With: T PRISCILLA KUR ON 10/25/2013 AT 7:15P BY WILEJ     Performed at [facility]   Report Status 10/27/2013 FINAL   Final  CULTURE, BLOOD (ROUTINE X 2)     Status: None   Collection Time    10/24/13  3:10 PM      Result Value Ref Range Status   Specimen Description BLOOD RIGHT ARM   Final   Special Requests BOTTLES DRAWN AEROBIC AND ANAEROBIC 10CC   Final   Culture  Setup Time     Final   Value: 10/24/2013 22:48     Performed at [facility]   Culture     Final   Value:        BLOOD CULTURE RECEIVED NO GROWTH TO DATE CULTURE WILL BE HELD FOR 5 DAYS BEFORE ISSUING A FINAL NEGATIVE REPORT     Performed at [facility]   Report Status PENDING   Incomplete  CLOSTRIDIUM DIFFICILE BY PCR     Status: None   Collection Time    10/24/13  5:48 PM      Result Value Ref Range Status   C difficile by pcr NEGATIVE  NEGATIVE Final     Studies: No results found.  Scheduled Meds: . apixaban  5 mg Oral BID  .  atorvastatin  20 mg Oral Daily  . gabapentin  300 mg Oral TID  . insulin aspart  0-15 Units Subcutaneous TID WC  . insulin aspart  0-5 Units Subcutaneous QHS  . insulin glargine  50 Units Subcutaneous QHS  . levothyroxine  150 mcg Oral Daily  . vancomycin  1,250 mg Intravenous Q12H   Continuous Infusions:   Principal Problem:   CVA (cerebral infarction)  Active Problems:   CAD (coronary artery disease)   Atrial flutter   Hypothyroidism   Hypertension   Hyperlipidemia   Poorly controlled type 2 diabetes mellitus with circulatory disorder    Time spent: >35 minutes     Esperanza Sheets  [facility] Pager [phone]. If 7PM-7AM, please contact night-coverage at [email or web address], password [facility] 10/27/2013, 3:21 PM  LOS: 6 days

## 2013-10-27 NOTE — Progress Notes (Signed)
Pt discharge education and instructions completed with pt and friend [name] at bedside. Pt handed print out education on stroke. Both pt and Pat voices understanding and denies any questions. All lines including IV and telemetry removed from pt. Pt transported off unit via wheelchair with belongings at side. Home health arranged by case management prior to pt discharge. Pt handed her prescription for Eliquis, Lipitor, novolog, Lopressor and Norvasc. Arabella Merles Hideo Googe RN.

## 2013-10-27 NOTE — Progress Notes (Signed)
Physical Therapy Treatment Patient Details Name: [name] [name] MRN: [id number] DOB: 01/08/1935 Today's Date: 10/27/2013    History of Present Illness 79 year old female admitted 10/21/13 due to expressive aphasia. PMH significant for DM2, HTN, CAD. CT revealed acute-subacute posterior left frontal infarct and remote left thalamic lacunar infarct.     PT Comments    Pt progressing her mobility with therapy, however, continues to be a fall risk due to decreased safety awareness. Discussed with pt recommendations for post acute rehab and pt continues to be adamant on returning home from hospital stay. Pt refusing to practice steps with therapy today.D/C disposition updated due to pt refusing SNF.   Follow Up Recommendations  Home health PT;Supervision/Assistance - 24 hour;Other (comment) (due to pt refusing SNF)     Equipment Recommendations  Rolling walker with 5" wheels    Recommendations for Other Services       Precautions / Restrictions Precautions Precautions: Fall Precaution Comments: pt unsteady on her feet, safety awareness issues, and difficulty communicating.  Restrictions Weight Bearing Restrictions: No    Mobility  Bed Mobility               General bed mobility comments: not addressed; pt up in chair   Transfers Overall transfer level: Needs assistance Equipment used: Rolling walker (2 wheeled);None Transfers: Sit to/from Stand Sit to Stand: Supervision         General transfer comment: cues for hand placement and safety with RW   Ambulation/Gait Ambulation/Gait assistance: Min guard Ambulation Distance (Feet): 300 Feet Assistive device: Rolling walker (2 wheeled) Gait Pattern/deviations: Step-through pattern;Decreased stride length;Wide base of support;Trunk flexed Gait velocity: too fast to be safe due to impulsivity and imbalance   General Gait Details: pt unsteady with gt and has difficulty negotiating RW around obstacles; easily distracted in  hallway and continues to be a fall risk due to cognition and balance deficits    Stairs Stairs:  (pt refusing to practice "ive already done it" )          Wheelchair Mobility    Modified Rankin (Stroke Patients Only) Modified Rankin (Stroke Patients Only) Pre-Morbid Rankin Score: No symptoms Modified Rankin: Moderately severe disability     Balance Overall balance assessment: Needs assistance         Standing balance support: During functional activity;No upper extremity supported Standing balance-Leahy Scale: Fair                      Cognition Arousal/Alertness: Awake/alert Behavior During Therapy: Impulsive Overall Cognitive Status: Impaired/Different from baseline Area of Impairment: Safety/judgement;Awareness;Problem solving     Memory: Decreased short-term memory   Safety/Judgement: Decreased awareness of safety;Decreased awareness of deficits Awareness: Emergent Problem Solving: Difficulty sequencing;Requires verbal cues;Requires tactile cues General Comments: easily distracted in hallway and difficulty problem solving to negotiate obstacles     Exercises      General Comments General comments (skin integrity, edema, etc.): discussed D/C recommendations with pt; pt adamant on D/C home      Pertinent Vitals/Pain No c/o pain     Home Living                      Prior Function            PT Goals (current goals can now be found in the care plan section) Acute Rehab PT Goals Patient Stated Goal: To go home PT Goal Formulation: With patient Time For Goal Achievement: 10/29/13 Potential  to Achieve Goals: Good Progress towards PT goals: Progressing toward goals    Frequency  Min 4X/week    PT Plan Discharge plan needs to be updated    Co-evaluation             End of Session Equipment Utilized During Treatment: Gait belt Activity Tolerance: Patient limited by fatigue Patient left: in chair;with call bell/phone within  reach;with chair alarm set     Time: 0911-0925 PT Time Calculation (min): 14 min  Charges:  $Gait Training: 8-22 mins                    G CodesDonnamarie Poag [place], [place]  [phone] 10/27/2013, 9:35 AM

## 2013-10-27 NOTE — Progress Notes (Signed)
Occupational Therapy Treatment Patient Details Name: [name] [name] MRN: [id number] DOB: 01-12-1935 Today's Date: 10/27/2013    History of present illness 79 year old female admitted 10/21/13 due to expressive aphasia. PMH significant for DM2, HTN, CAD. CT revealed acute-subacute posterior left frontal infarct and remote left thalamic lacunar infarct.    OT comments  Pt seen today for ADLs and functional mobility. Pt continues to have decreased awareness of safety and insight into deficits which impair pt's safety with ADLs. Pt is adamantly refusing SNF at this time and education provided to pt regarding home safety, fall prevention, and safety with DME. Continue to feel that pt would benefit from SNF, however if pt continues to refuse, HHOT would benefit pt with home safety.    Follow Up Recommendations  Home health OT;Other (comment) (SNF would be safest, but pt refusing at this time. )    Equipment Recommendations  3 in 1 bedside comode       Precautions / Restrictions Precautions Precautions: Fall Precaution Comments: pt unsteady on her feet, safety awareness issues, and difficulty communicating.  Restrictions Weight Bearing Restrictions: No       Mobility Bed Mobility               General bed mobility comments: Pt in recliner before and after OT session  Transfers Overall transfer level: Needs assistance Equipment used: Rolling walker (2 wheeled);None Transfers: Sit to/from Stand Sit to Stand: Supervision         General transfer comment: cues for hand placement and safety with RW     Balance Overall balance assessment: Needs assistance Sitting-balance support: No upper extremity supported;Feet supported Sitting balance-Leahy Scale: Good     Standing balance support: Bilateral upper extremity supported;During functional activity Standing balance-Leahy Scale: Fair                     ADL Overall ADL's : Needs assistance/impaired     Grooming:  Wash/dry face;Wash/dry hands;Supervision/safety;Standing (with RW) Grooming Details (indicate cue type and reason): Pt attempted to step around RW to stand at sink, however encouraged pt to keep RW with her at all times.      Lower Body Bathing: Sit to/from stand;Supervison/ safety       Lower Body Dressing: Sit to/from stand;Supervision/safety   Toilet Transfer: Min guard;Ambulation;RW           Functional mobility during ADLs: Min guard;Rolling walker General ADL Comments: Pt reports that her "friend" is coming to pick her up and pt was very concerned that her friend was downstairs waiting for her. Discussed home safety with pt including what to do in an emergency and fall prevention with use of RW. Pt continues to have decreased awareness of safety and deficits. Pt reported that she is going to live home alone without supervision and OT discussed with pt the reason for supervision.          Perception         Cognition  Arousal/Alertness: Awake/ Alert Behavior During Therapy: Impulsive Overall Cognitive Status: Impaired/Different from baseline Area of Impairment: Safety/judgement;Awareness;Problem solving;Attention;Memory   Current Attention Level: Sustained Memory: Decreased short-term memory    Safety/Judgement: Decreased awareness of safety;Decreased awareness of deficits Awareness: Emergent Problem Solving: Difficulty sequencing;Requires verbal cues;Requires tactile cues General Comments: Pt continues to perseverate on numbers and relies on short common phrases to communicate, however has difficulty when pushed to describe or elaborate.  Pertinent Vitals/ Pain       NAD         Frequency Min 2X/week     Progress Toward Goals  OT Goals(current goals can now be found in the care plan section)  Progress towards OT goals: Goals met and updated - see care plan  ADL Goals Pt Will Perform Grooming: with modified independence;standing Pt  Will Perform Lower Body Bathing: with modified independence;sit to/from stand Pt Will Perform Lower Body Dressing: with modified independence;sit to/from stand Pt Will Transfer to Toilet: with modified independence;ambulating;regular height toilet Pt Will Perform Tub/Shower Transfer: Tub transfer;with modified independence;ambulating;3 in 1;rolling walker  Plan Discharge plan needs to be updated       End of Session Equipment Utilized During Treatment: Gait belt;Rolling walker   Activity Tolerance Patient tolerated treatment well   Patient Left in chair;with call bell/phone within reach;with chair alarm set           Time: 6659-9357 OT Time Calculation (min): 31 min  Charges: OT General Charges $OT Visit: 1 Procedure OT Treatments $Self Care/Home Management : 23-37 mins  Juluis Rainier [phone] 10/27/2013, 5:37 PM

## 2013-10-28 LAB — GLUCOSE, CAPILLARY: GLUCOSE-CAPILLARY: 215 mg/dL — AB (ref 70–99)

## 2013-10-30 LAB — CULTURE, BLOOD (ROUTINE X 2): Culture: NO GROWTH

## 2013-11-04 ENCOUNTER — Telehealth: Payer: Self-pay | Admitting: *Deleted

## 2013-11-04 NOTE — Telephone Encounter (Signed)
Rosalita Chessman called states pt is refusing to change from Novolin to Novolog as indicated in chart upon discharge from hospital on 6.10.15.  Further states [facility] has no record of Novolog Rx.  Is the pt to take Novolin or Novolog?  If pt is to take Novolin what are the directions.  Please advise

## 2013-11-04 NOTE — Telephone Encounter (Signed)
Please make her a follow up. She should be on Novolog.

## 2013-11-04 NOTE — Telephone Encounter (Signed)
Spoke with [facility] RN advised pt has appoint already scheduled on 6.30.15.

## 2013-11-15 ENCOUNTER — Encounter: Payer: Self-pay | Admitting: Vascular Surgery

## 2013-11-16 ENCOUNTER — Encounter: Payer: Self-pay | Admitting: Internal Medicine

## 2013-11-16 ENCOUNTER — Ambulatory Visit (INDEPENDENT_AMBULATORY_CARE_PROVIDER_SITE_OTHER): Payer: Commercial Managed Care - HMO | Admitting: Internal Medicine

## 2013-11-16 ENCOUNTER — Ambulatory Visit (INDEPENDENT_AMBULATORY_CARE_PROVIDER_SITE_OTHER): Payer: Commercial Managed Care - HMO | Admitting: Vascular Surgery

## 2013-11-16 ENCOUNTER — Telehealth: Payer: Self-pay | Admitting: Internal Medicine

## 2013-11-16 ENCOUNTER — Encounter: Payer: Self-pay | Admitting: Vascular Surgery

## 2013-11-16 VITALS — BP 102/58 | HR 70 | Temp 98.2°F | Ht 63.0 in | Wt 219.8 lb

## 2013-11-16 VITALS — BP 124/53 | HR 70 | Resp 18 | Ht 63.0 in | Wt 218.7 lb

## 2013-11-16 DIAGNOSIS — I63239 Cerebral infarction due to unspecified occlusion or stenosis of unspecified carotid arteries: Secondary | ICD-10-CM

## 2013-11-16 DIAGNOSIS — I6529 Occlusion and stenosis of unspecified carotid artery: Secondary | ICD-10-CM

## 2013-11-16 DIAGNOSIS — I10 Essential (primary) hypertension: Secondary | ICD-10-CM

## 2013-11-16 DIAGNOSIS — I999 Unspecified disorder of circulatory system: Secondary | ICD-10-CM

## 2013-11-16 DIAGNOSIS — E1159 Type 2 diabetes mellitus with other circulatory complications: Secondary | ICD-10-CM

## 2013-11-16 DIAGNOSIS — I798 Other disorders of arteries, arterioles and capillaries in diseases classified elsewhere: Secondary | ICD-10-CM

## 2013-11-16 DIAGNOSIS — E1165 Type 2 diabetes mellitus with hyperglycemia: Secondary | ICD-10-CM

## 2013-11-16 MED ORDER — APIXABAN 5 MG PO TABS: 5.0000 mg | ORAL_TABLET | Freq: Two times a day (BID) | ORAL | Status: DC

## 2013-11-16 NOTE — Telephone Encounter (Signed)
Pt was told to return in 2 weeks for a recheck. Please advise of date and time to place patient.

## 2013-11-16 NOTE — Assessment & Plan Note (Signed)
BP Readings from Last 3 Encounters:  11/16/13 102/58  11/16/13 124/53  10/27/13 175/66   BP well controlled on current medications. Will continue.

## 2013-11-16 NOTE — Assessment & Plan Note (Signed)
Lab Results  Component Value Date   HGBA1C 7.7* 10/22/2013   Recent BG relatively well controlled. Continue Lantus and Novolog insulin.

## 2013-11-16 NOTE — Progress Notes (Signed)
Subjective:    Patient ID: [name] [name], female    DOB: 05/17/1935, 79 y.o.   MRN: [id number]  HPI 79YO female presents for hospital follow up. She was brought to clinic by a friend.  Admitted 6/4 with acute CVA. Found unresponsive by landlord. On evaluation, MRI showed left posterior frontal and insular CVA, secondary to left MCA occlusion. Started on Eliquis. Discharged home 6/10.  She is confused today and has trouble answering questions. Reports that she is doing well at home. Having speech therapy and physical therapy every other day.  Feels that function is back to normal. Would like to start driving. Has follow up scheduled with neurology scheduled, but unsure when this is.      Review of Systems  Constitutional: Negative for fever, chills, appetite change, fatigue and unexpected weight change.  Eyes: Negative for visual disturbance.  Respiratory: Negative for shortness of breath.   Cardiovascular: Negative for chest pain and leg swelling.  Gastrointestinal: Negative for abdominal pain.  Skin: Negative for color change and rash.  Neurological: Negative for dizziness, tremors, seizures, syncope, facial asymmetry, speech difficulty, weakness, light-headedness, numbness and headaches.  Hematological: Negative for adenopathy. Does not bruise/bleed easily.  Psychiatric/Behavioral: Positive for confusion and decreased concentration. Negative for sleep disturbance and dysphoric mood. The patient is not nervous/anxious.        Objective:    BP 102/58  Pulse 70  Temp(Src) 98.2 F (36.8 C) (Oral)  Ht 5\' 3"  (1.6 m)  Wt 219 lb 12 oz (99.678 kg)  BMI 38.94 kg/m2  SpO2 96% Physical Exam  Constitutional: She is oriented to person, place, and time. She appears well-developed and well-nourished. No distress.  HENT:  Head: Normocephalic and atraumatic.  Right Ear: External ear normal.  Left Ear: External ear normal.  Nose: Nose normal.  Mouth/Throat: Oropharynx is clear and  moist. No oropharyngeal exudate.  Eyes: Conjunctivae are normal. Pupils are equal, round, and reactive to light. Right eye exhibits no discharge. Left eye exhibits no discharge. No scleral icterus.  Neck: Normal range of motion. Neck supple. No tracheal deviation present. No thyromegaly present.  Cardiovascular: Normal rate, regular rhythm, normal heart sounds and intact distal pulses.  Exam reveals no gallop and no friction rub.   No murmur heard. Pulmonary/Chest: Effort normal and breath sounds normal. No accessory muscle usage. Not tachypneic. No respiratory distress. She has no decreased breath sounds. She has no wheezes. She has no rhonchi. She has no rales. She exhibits no tenderness.  Musculoskeletal: Normal range of motion. She exhibits no edema and no tenderness.  Lymphadenopathy:    She has no cervical adenopathy.  Neurological: She is alert and oriented to person, place, and time. She is not disoriented. She displays no atrophy and no tremor. No cranial nerve deficit or sensory deficit. She exhibits normal muscle tone. She displays no seizure activity. Coordination and gait normal.  Skin: Skin is warm and dry. No rash noted. She is not diaphoretic. No erythema. No pallor.  Psychiatric: She has a normal mood and affect. Her speech is normal and behavior is normal. Thought content normal. Cognition and memory are impaired. She expresses inappropriate judgment. She exhibits abnormal recent memory and abnormal remote memory.          Assessment & Plan:   Problem List Items Addressed This Visit     High   CVA (cerebral infarction) - Primary     Recent left MCA CVA. Overall, strength seems to be improved compared to  initial event, however pt continues to display some confusion today with decreased short and long term memory. Advised her not to drive. She will call with follow up appointment with neurology, and if this has not been scheduled, we will set up for her. Continue Eliquis.  Continue statin. Continue PT and ST. Reviewed hospital records including imaging and labs with pt today. Follow up here in 2 weeks.    Relevant Medications      apixaban (ELIQUIS) tablet   Hypertension      BP Readings from Last 3 Encounters:  11/16/13 102/58  11/16/13 124/53  10/27/13 175/66   BP well controlled on current medications. Will continue.     Relevant Medications      apixaban (ELIQUIS) tablet   Poorly controlled type 2 diabetes mellitus with circulatory disorder (Chronic)      Lab Results  Component Value Date   HGBA1C 7.7* 10/22/2013   Recent BG relatively well controlled. Continue Lantus and Novolog insulin.    Relevant Medications      apixaban (ELIQUIS) tablet       Return in about 2 weeks (around 11/30/2013) for Recheck.

## 2013-11-16 NOTE — Progress Notes (Signed)
Patient name: [name] [name] MRN: [id number] DOB: 1934-11-20 Sex: female   Referred by: [facility]  Reason for referral:  Chief Complaint  Patient presents with  . New Evaluation    referral from [facility] for carotid stenosis  . Carotid    HISTORY OF PRESENT ILLNESS: Patient is a 79 year old female who is seen today for discussion of extracranial cerebrovascular occlusive disease. She was admitted to [facility] several weeks ago for a left brain stroke. She presented with confusion and aphasia. She specifically denies any focal motor deficits. Workup included MRI showing acute stroke and also she underwent a carotid duplex and is here today for further discussion of this. She was found to have atrial flutter and this was the presumed mechanism of her stroke. She was placed on anticoagulation therapy. She denies any prior neurologic deficits before this recent admission event. Does have history of coronary artery disease.  Past Medical History  Diagnosis Date  . DM (diabetes mellitus)   . Paroxysmal atrial flutter   . Dyslipidemia   . CAD (coronary artery disease)     a. s/p prior PCI RCA with ISR req CBA;  b. 04/2013 Neg MV;  c. 05/2013 Cath: LM 20, LAD 30p, LCX 20p, OM1 90 small, RCA 42m, PDA 40-->Med Rx.  . Osteoarthritis   . Hypothyroidism     hx  . Rheumatoid arthritis(714.0)   . Obesity   . GERD (gastroesophageal reflux disease)   . Depression   . Shoulder fracture, left     Dr. Martha Clan    Past Surgical History  Procedure Laterality Date  . Rca stent placement  2000  . Restenosis with ptca placement  2005  . Cataract surgery    . Partial hysterectomy    . Replacement total knee  2013    right  . Shoulder surgery  02/04/13  . Cardiac catheterization  06/11/2013  . Coronary angioplasty      History   Social History  . Marital Status: Married    Spouse Name: N/A    Number of Children: N/A  . Years of Education: N/A    Occupational History  . Not on file.   Social History Main Topics  . Smoking status: Never Smoker   . Smokeless tobacco: Never Used     Comment: former passive smoker  . Alcohol Use: No  . Drug Use: No  . Sexual Activity: Not on file   Other Topics Concern  . Not on file   Social History Narrative   Widow; lives in [place]. Three children. 4 grandchildren; 4 great grandchildren   Regular exercise: yes   Caffeine use: tea occasionally    Family History  Problem Relation Age of Onset  . Esophageal cancer Mother   . Diabetes Mother   . Cancer Mother     esophageal     Allergies as of 11/16/2013 - Review Complete 11/16/2013  Allergen Reaction Noted  . Oxycodone Nausea And Vomiting 02/04/2012    Current Outpatient Prescriptions on File Prior to Visit  Medication Sig Dispense Refill  . albuterol (PROVENTIL HFA;VENTOLIN HFA) 108 (90 BASE) MCG/ACT inhaler Inhale 2 puffs into the lungs every 6 (six) hours as needed for wheezing or shortness of breath.  1 Inhaler  0  . amLODipine (NORVASC) 10 MG tablet Take 1 tablet (10 mg total) by mouth daily.  90 tablet  2  . apixaban (ELIQUIS) 5 MG TABS tablet Take 1 tablet (5 mg total) by  mouth 2 (two) times daily.  60 tablet  0  . atorvastatin (LIPITOR) 20 MG tablet Take 1 tablet (20 mg total) by mouth daily.  30 tablet  3  . cyclobenzaprine (FLEXERIL) 5 MG tablet Take 1 tablet (5 mg total) by mouth 3 (three) times daily as needed.  30 tablet  0  . ferrous sulfate 325 (65 FE) MG tablet Take 325 mg by mouth daily with breakfast.      . fluticasone (FLONASE) 50 MCG/ACT nasal spray Place 2 sprays into the nose daily.  16 g  3  . furosemide (LASIX) 40 MG tablet Take 1 tablet (40 mg total) by mouth 2 (two) times daily.  180 tablet  3  . gabapentin (NEURONTIN) 300 MG capsule Take 1 capsule (300 mg total) by mouth 3 (three) times daily.  270 capsule  3  . insulin aspart (NOVOLOG) 100 UNIT/ML injection Before each meal 3 times a day, 140-199 - 2  units, 200-250 - 4 units, 251-299 - 6 units,  300-349 - 8 units,  350 or above 10 units. Insulin PEN if approved, provide syringes and needles if needed.  10 mL  11  . insulin glargine (LANTUS) 100 UNIT/ML injection Inject 0.5 mLs (50 Units total) into the skin at bedtime.  30 mL  3  . isosorbide mononitrate (ISMO,MONOKET) 10 MG tablet Take 1 tablet (10 mg total) by mouth 2 (two) times daily.  180 tablet  3  . latanoprost (XALATAN) 0.005 % ophthalmic solution Place 1 drop into both eyes at bedtime.       Marland Kitchen levothyroxine (LEVOTHROID) 150 MCG tablet Take 1 tablet (150 mcg total) by mouth daily.  90 tablet  3  . lisinopril (PRINIVIL,ZESTRIL) 10 MG tablet Take 1 tablet (10 mg total) by mouth daily.  90 tablet  1  . meclizine (ANTIVERT) 25 MG tablet Take 1 tablet (25 mg total) by mouth 2 (two) times daily. As needed for dizziness or nausea  60 tablet  3  . metFORMIN (GLUCOPHAGE) 500 MG tablet Take 1 tablet (500 mg total) by mouth 2 (two) times daily.  180 tablet  3  . metoprolol tartrate (LOPRESSOR) 25 MG tablet Take 1 tablet (25 mg total) by mouth 2 (two) times daily as needed.  180 tablet  3  . nitroGLYCERIN (NITROSTAT) 0.4 MG SL tablet Place 1 tablet (0.4 mg total) under the tongue every 5 (five) minutes as needed.  25 tablet  3  . potassium chloride SA (K-DUR,KLOR-CON) 20 MEQ tablet Take 1 tablet (20 mEq total) by mouth 2 (two) times daily.  180 tablet  3  . traMADol (ULTRAM) 50 MG tablet Take 100 mg by mouth every 8 (eight) hours as needed for moderate pain.      Marland Kitchen omeprazole (PRILOSEC) 20 MG capsule Take 1 capsule (20 mg total) by mouth 2 (two) times daily.  180 capsule  3   No current facility-administered medications on file prior to visit.     REVIEW OF SYSTEMS:  Positives indicated with an "X"  CARDIOVASCULAR:  [ ]  chest pain   [ ]  chest pressure   [ ]  palpitations   [ ]  orthopnea   [ ]  dyspnea on exertion   [ ]  claudication   [ ]  rest pain   [ ]  DVT   [ ]  phlebitis PULMONARY:   [ ]   productive cough   [ ]  asthma   [ ]  wheezing NEUROLOGIC:   [ ]  weakness  [ ]  paresthesias  [ ]   aphasia  [ ]  amaurosis  [ ]  dizziness HEMATOLOGIC:   [ ]  bleeding problems   [ ]  clotting disorders MUSCULOSKELETAL:  [ ]  joint pain   [ ]  joint swelling GASTROINTESTINAL: [ ]   blood in stool  [ ]   hematemesis GENITOURINARY:  [ ]   dysuria  [ ]   hematuria PSYCHIATRIC:  [ ]  history of major depression INTEGUMENTARY:  [ ]  rashes  [ ]  ulcers CONSTITUTIONAL:  [ ]  fever   [ ]  chills  PHYSICAL EXAMINATION:  General: The patient is a well-nourished female, in no acute distress. Vital signs are BP 124/53  Pulse 70  Resp 18  Ht 5\' 3"  (1.6 m)  Wt 218 lb 11.2 oz (99.202 kg)  BMI 38.75 kg/m2 Pulmonary: There is a good air exchange bilaterally without wheezing or rales. Abdomen: Soft and non-tender . Musculoskeletal: There are no major deformities.  There is no significant extremity pain. Neurologic: No focal weakness or paresthesias are detected, Skin: There are no ulcer or rashes noted. Psychiatric: The patient has normal affect. Cardiovascular: There is a regular rate  Pulse status 2+ radial and 2+ or cells pedis pulses bilaterally Carotid arteries without bruits bilaterally  Vascular Lab Studies: I reviewed her carotid duplex from her hospitalization at [facility]. This shows minimal plaque with no significant stenosis in the left carotid artery. Right carotid is 40-59% stenosis. Impression and Plan:  Had a long discussion with the patient explaining that there is no evidence of carotid disease could have caused her left brain event leaving her with some ectasia. This is improving fortunately. I did explain the recommendation for surveillance of her right carotid stenosis and explained symptoms of carotid disease. She knows to notify immediately should this occur or present emergent need to the emergency room should she have a worsening deficit. We will see her again in one year for repeat  carotid duplex    Mylo Driskill [facility] Office: [phone]

## 2013-11-16 NOTE — Progress Notes (Signed)
Pre visit review using our clinic review tool, if applicable. No additional management support is needed unless otherwise documented below in the visit note. 

## 2013-11-16 NOTE — Assessment & Plan Note (Signed)
Recent left MCA CVA. Overall, strength seems to be improved compared to initial event, however pt continues to display some confusion today with decreased short and long term memory. Advised her not to drive. She will call with follow up appointment with neurology, and if this has not been scheduled, we will set up for her. Continue Eliquis. Continue statin. Continue PT and ST. Reviewed hospital records including imaging and labs with pt today. Follow up here in 2 weeks.

## 2013-11-16 NOTE — Patient Instructions (Signed)
Call and let us know when your follow up with neurology is scheduled.

## 2013-11-17 ENCOUNTER — Other Ambulatory Visit: Payer: Self-pay | Admitting: *Deleted

## 2013-11-17 ENCOUNTER — Telehealth: Payer: Self-pay | Admitting: Neurology

## 2013-11-17 DIAGNOSIS — IMO0002 Reserved for concepts with insufficient information to code with codable children: Secondary | ICD-10-CM

## 2013-11-17 DIAGNOSIS — E1165 Type 2 diabetes mellitus with hyperglycemia: Secondary | ICD-10-CM

## 2013-11-17 MED ORDER — METFORMIN HCL 500 MG PO TABS: 500.0000 mg | ORAL_TABLET | Freq: Two times a day (BID) | ORAL | Status: DC

## 2013-11-17 NOTE — Telephone Encounter (Signed)
Patient called and stated PCP instructed her see Dr. Pearlean Brownie asap.  She stated Dr. Pearlean Brownie informed her not to drive and [place]  Please call and advise.  She has an appointment scheduled with Dr. Roda Shutters on 8/10.

## 2013-11-17 NOTE — Telephone Encounter (Signed)
Pt called, states she was told to call for her meds today.  Needs metformin and D3 2000.  States if they are called before 1:00 they can be delivered.  States she cannot get out to pick up medications and needs them delivered.

## 2013-11-17 NOTE — Telephone Encounter (Signed)
Return call to patient ,could not leave VM message that(Dr Pearlean Brownie is not in the office) , will be back next week so we will touch basis with him concerning a sooner appt.

## 2013-11-17 NOTE — Telephone Encounter (Signed)
Sent refill to pharmacy. 

## 2013-11-17 NOTE — Telephone Encounter (Signed)
Spoke with patient and she would like a sooner appt, requesting to be able to drive(see Dr Minus Breeding ED notes from 10/23/13)

## 2013-11-22 ENCOUNTER — Telehealth: Payer: Self-pay | Admitting: Neurology

## 2013-11-22 NOTE — Telephone Encounter (Signed)
Patient is returning call.  °

## 2013-11-23 NOTE — Telephone Encounter (Signed)
Spoke with patient and she said that Dr Pearlean Brownie told her in hospital that she is not to drive but she is fine, and needs to be able to drive before her visit in August. Has noone to take drive for her.Is schedule to f/u with Dr Roda Shutters.

## 2013-11-27 NOTE — Telephone Encounter (Signed)
Ok to drive 

## 2013-11-29 ENCOUNTER — Telehealth: Payer: Self-pay | Admitting: Neurology

## 2013-11-29 NOTE — Telephone Encounter (Signed)
Patient is returning a call--see Dr. Marlis Edelson message 11-27-13--please call patient--thank you.

## 2013-11-29 NOTE — Telephone Encounter (Signed)
Spoke with patient and informed per Dr Marlis Edelson message that it is ok to drive

## 2013-12-02 ENCOUNTER — Telehealth: Payer: Self-pay | Admitting: *Deleted

## 2013-12-02 NOTE — Telephone Encounter (Signed)
Spoke with Amy, advised of MDs message 

## 2013-12-02 NOTE — Telephone Encounter (Signed)
Fine to order 

## 2013-12-02 NOTE — Telephone Encounter (Signed)
Amy Speech Therapist from [facility] called requesting Speech Therapy twice a week for 3 weeks.  Please advise

## 2013-12-20 ENCOUNTER — Telehealth: Payer: Self-pay | Admitting: *Deleted

## 2013-12-20 NOTE — Telephone Encounter (Signed)
Amy called requesting Home health for speech therapy twice a week for 4 weeks.  Please advise

## 2013-12-20 NOTE — Telephone Encounter (Signed)
Fine to give order for ST

## 2013-12-21 ENCOUNTER — Ambulatory Visit: Payer: Commercial Managed Care - HMO | Admitting: Internal Medicine

## 2013-12-21 NOTE — Telephone Encounter (Signed)
Amy notified

## 2013-12-22 ENCOUNTER — Ambulatory Visit: Payer: Commercial Managed Care - HMO | Admitting: Adult Health

## 2013-12-23 ENCOUNTER — Ambulatory Visit (INDEPENDENT_AMBULATORY_CARE_PROVIDER_SITE_OTHER): Payer: Commercial Managed Care - HMO | Admitting: Adult Health

## 2013-12-23 ENCOUNTER — Encounter: Payer: Self-pay | Admitting: Adult Health

## 2013-12-23 VITALS — BP 118/72 | HR 71 | Temp 98.2°F | Resp 14 | Wt 221.2 lb

## 2013-12-23 DIAGNOSIS — M25562 Pain in left knee: Secondary | ICD-10-CM

## 2013-12-23 DIAGNOSIS — M25569 Pain in unspecified knee: Secondary | ICD-10-CM

## 2013-12-23 NOTE — Progress Notes (Signed)
Patient ID: [name] [name], female   DOB: 1934/06/12, 79 y.o.   MRN: [id number]   Subjective:    Patient ID: [name] [name], female    DOB: 05-01-1935, 79 y.o.   MRN: [id number]  HPI Pt is a pleasant 79 y/o female who presents to clinic with left knee pain that has been ongoing for > 3 weeks. She has been applying a topical pain medication but does not remember the name. She reports worse with standing and walking. Improves with rest but then painful because of stiffening. She has not been applying ice to the area. Uses a cane to get around. She is limping 2/2 pain.   Past Medical History  Diagnosis Date  . DM (diabetes mellitus)   . Paroxysmal atrial flutter   . Dyslipidemia   . CAD (coronary artery disease)     a. s/p prior PCI RCA with ISR req CBA;  b. 04/2013 Neg MV;  c. 05/2013 Cath: LM 20, LAD 30p, LCX 20p, OM1 90 small, RCA 3m, PDA 40-->Med Rx.  . Osteoarthritis   . Hypothyroidism     hx  . Rheumatoid arthritis(714.0)   . Obesity   . GERD (gastroesophageal reflux disease)   . Depression   . Shoulder fracture, left     Dr. Martha Clan    Current Outpatient Prescriptions on File Prior to Visit  Medication Sig Dispense Refill  . albuterol (PROVENTIL HFA;VENTOLIN HFA) 108 (90 BASE) MCG/ACT inhaler Inhale 2 puffs into the lungs every 6 (six) hours as needed for wheezing or shortness of breath.  1 Inhaler  0  . amLODipine (NORVASC) 10 MG tablet Take 1 tablet (10 mg total) by mouth daily.  90 tablet  2  . apixaban (ELIQUIS) 5 MG TABS tablet Take 1 tablet (5 mg total) by mouth 2 (two) times daily.  60 tablet  3  . atorvastatin (LIPITOR) 20 MG tablet Take 1 tablet (20 mg total) by mouth daily.  30 tablet  3  . cyclobenzaprine (FLEXERIL) 5 MG tablet Take 1 tablet (5 mg total) by mouth 3 (three) times daily as needed.  30 tablet  0  . ferrous sulfate 325 (65 FE) MG tablet Take 325 mg by mouth daily with breakfast.      . fluticasone (FLONASE) 50 MCG/ACT nasal spray Place 2 sprays into  the nose daily.  16 g  3  . furosemide (LASIX) 40 MG tablet Take 1 tablet (40 mg total) by mouth 2 (two) times daily.  180 tablet  3  . gabapentin (NEURONTIN) 300 MG capsule Take 1 capsule (300 mg total) by mouth 3 (three) times daily.  270 capsule  3  . insulin aspart (NOVOLOG) 100 UNIT/ML injection Before each meal 3 times a day, 140-199 - 2 units, 200-250 - 4 units, 251-299 - 6 units,  300-349 - 8 units,  350 or above 10 units. Insulin PEN if approved, provide syringes and needles if needed.  10 mL  11  . insulin glargine (LANTUS) 100 UNIT/ML injection Inject 0.5 mLs (50 Units total) into the skin at bedtime.  30 mL  3  . isosorbide mononitrate (ISMO,MONOKET) 10 MG tablet Take 1 tablet (10 mg total) by mouth 2 (two) times daily.  180 tablet  3  . latanoprost (XALATAN) 0.005 % ophthalmic solution Place 1 drop into both eyes at bedtime.       Marland Kitchen levothyroxine (LEVOTHROID) 150 MCG tablet Take 1 tablet (150 mcg total) by mouth daily.  90 tablet  3  .  lisinopril (PRINIVIL,ZESTRIL) 10 MG tablet Take 1 tablet (10 mg total) by mouth daily.  90 tablet  1  . meclizine (ANTIVERT) 25 MG tablet Take 1 tablet (25 mg total) by mouth 2 (two) times daily. As needed for dizziness or nausea  60 tablet  3  . metFORMIN (GLUCOPHAGE) 500 MG tablet Take 1 tablet (500 mg total) by mouth 2 (two) times daily.  180 tablet  3  . metoprolol tartrate (LOPRESSOR) 25 MG tablet Take 1 tablet (25 mg total) by mouth 2 (two) times daily as needed.  180 tablet  3  . nitroGLYCERIN (NITROSTAT) 0.4 MG SL tablet Place 1 tablet (0.4 mg total) under the tongue every 5 (five) minutes as needed.  25 tablet  3  . potassium chloride SA (K-DUR,KLOR-CON) 20 MEQ tablet Take 1 tablet (20 mEq total) by mouth 2 (two) times daily.  180 tablet  3  . omeprazole (PRILOSEC) 20 MG capsule Take 1 capsule (20 mg total) by mouth 2 (two) times daily.  180 capsule  3  . traMADol (ULTRAM) 50 MG tablet Take 100 mg by mouth every 8 (eight) hours as needed for  moderate pain.       No current facility-administered medications on file prior to visit.    Review of Systems  Musculoskeletal: Positive for arthralgias and joint swelling.       Left knee pain with weight bearing, swelling       Objective:  BP 118/72  Pulse 71  Temp(Src) 98.2 F (36.8 C) (Oral)  Resp 14  Wt 221 lb 4 oz (100.358 kg)  SpO2 96%   Physical Exam  Constitutional: She is oriented to person, place, and time. No distress.  Cardiovascular: Normal rate and regular rhythm.   Pulmonary/Chest: Effort normal. No respiratory distress.  Musculoskeletal: Normal range of motion. She exhibits edema and tenderness.  Crepitus of left knee. Swelling. Tender to touch. Pt is ambulating with a cane and limping.  Neurological: She is alert and oriented to person, place, and time.  Skin: Skin is warm and dry.  Psychiatric: She has a normal mood and affect. Her behavior is normal. Judgment and thought content normal.      Assessment & Plan:   1. Pain in left knee Started ~ 3 weeks ago. Does not recall injury. No stairs in home. Crepitus, swelling and tender to touch. Rest, elevate extremity, apply ice 3-4 times daily for 20 min. Will refer to ortho for evaluation and management. Hx of surgery of left knee many years ago but does not recall what procedure. She did not have a replacement. - Ambulatory referral to Orthopedic Surgery

## 2013-12-27 ENCOUNTER — Ambulatory Visit: Payer: Self-pay | Admitting: Neurology

## 2013-12-31 ENCOUNTER — Ambulatory Visit (INDEPENDENT_AMBULATORY_CARE_PROVIDER_SITE_OTHER): Payer: Commercial Managed Care - HMO | Admitting: Nurse Practitioner

## 2013-12-31 ENCOUNTER — Encounter: Payer: Self-pay | Admitting: Neurology

## 2013-12-31 VITALS — BP 160/67 | HR 70 | Ht 62.5 in | Wt 226.0 lb

## 2013-12-31 DIAGNOSIS — I6350 Cerebral infarction due to unspecified occlusion or stenosis of unspecified cerebral artery: Secondary | ICD-10-CM

## 2013-12-31 DIAGNOSIS — I639 Cerebral infarction, unspecified: Secondary | ICD-10-CM

## 2013-12-31 DIAGNOSIS — R4701 Aphasia: Secondary | ICD-10-CM

## 2013-12-31 NOTE — Patient Instructions (Addendum)
Continue eliquis (apixaban) for secondary stroke prevention and maintain strict control of hypertension with blood pressure goal below 140/90, diabetes with hemoglobin A1c goal below 7% and lipids with LDL cholesterol goal below 100 mg/dL. Followup in the future with Dr. Roda Shutters in 3 months. Sooner as needed.   Stroke Prevention Some medical conditions and behaviors are associated with an increased chance of having a stroke. You may prevent a stroke by making healthy choices and managing medical conditions. HOW CAN I REDUCE MY RISK OF HAVING A STROKE?   Stay physically active. Get at least 30 minutes of activity on most or all days.  Do not smoke. It may also be helpful to avoid exposure to secondhand smoke.  Limit alcohol use. Moderate alcohol use is considered to be:  No more than 2 drinks per day for men.  No more than 1 drink per day for nonpregnant women.  Eat healthy foods. This involves:  Eating 5 or more servings of fruits and vegetables a day.  Making dietary changes that address high blood pressure (hypertension), high cholesterol, diabetes, or obesity.  Manage your cholesterol levels.  Making food choices that are high in fiber and low in saturated fat, trans fat, and cholesterol may control cholesterol levels.  Take any prescribed medicines to control cholesterol as directed by your health care provider.  Manage your diabetes.  Controlling your carbohydrate and sugar intake is recommended to manage diabetes.  Take any prescribed medicines to control diabetes as directed by your health care provider.  Control your hypertension.  Making food choices that are low in salt (sodium), saturated fat, trans fat, and cholesterol is recommended to manage hypertension.  Take any prescribed medicines to control hypertension as directed by your health care provider.  Maintain a healthy weight.  Reducing calorie intake and making food choices that are low in sodium, saturated fat,  trans fat, and cholesterol are recommended to manage weight.  Stop drug abuse.  Avoid taking birth control pills.  Talk to your health care provider about the risks of taking birth control pills if you are over 65 years old, smoke, get migraines, or have ever had a blood clot.  Get evaluated for sleep disorders (sleep apnea).  Talk to your health care provider about getting a sleep evaluation if you snore a lot or have excessive sleepiness.  Take medicines only as directed by your health care provider.  For some people, aspirin or blood thinners (anticoagulants) are helpful in reducing the risk of forming abnormal blood clots that can lead to stroke. If you have the irregular heart rhythm of atrial fibrillation, you should be on a blood thinner unless there is a good reason you cannot take them.  Understand all your medicine instructions.  Make sure that other conditions (such as anemia or atherosclerosis) are addressed. SEEK IMMEDIATE MEDICAL CARE IF:   You have sudden weakness or numbness of the face, arm, or leg, especially on one side of the body.  Your face or eyelid droops to one side.  You have sudden confusion.  You have trouble speaking (aphasia) or understanding.  You have sudden trouble seeing in one or both eyes.  You have sudden trouble walking.  You have dizziness.  You have a loss of balance or coordination.  You have a sudden, severe headache with no known cause.  You have new chest pain or an irregular heartbeat. Any of these symptoms may represent a serious problem that is an emergency. Do not wait to see  if the symptoms will go away. Get medical help at once. Call your local emergency services (911 in [place] Do not drive yourself to the hospital. Document Released: 06/13/2004 Document Revised: 09/20/2013 Document Reviewed: 11/06/2012 [facility] Patient Information 2015 [employer], [place]. This information is not intended to replace advice given to you by your  health care provider. Make sure you discuss any questions you have with your health care provider.

## 2013-12-31 NOTE — Progress Notes (Signed)
PATIENT: [name] [name] DOB: [birth year]  REASON FOR VISIT: hospital follow up for stroke HISTORY FROM: patient  HISTORY OF PRESENT ILLNESS: [name] [name] is an 79 y.o. Caucasian female who comes to the office for first hospital follow up post hospital discharge for stroke. Daughter called patient 10/22/13 and she was not answering phone thus 911 was called. EMS arrived at scene and house was opened by landlord. Patient was found sitting at the table and not following commands. Patient was brought to ED. On presentation to ED exhibiting receptive>expressive aphasia. She was found to have atrial flutter and this was the presumed mechanism of her stroke. MRI showed a 4.8 cm region of acute infarction affecting the left posterior frontal and insular region.  MRA showed diffuse and widespread atherosclerotic narrowing and irregularity of the medium sized intracranial arteries. There are a few missing MCA branches on the left corresponding with the region of infarction. No correctable stenosis is seen.  She specifically denies any focal motor deficits. Carotid duplex showed minimal plaque with no significant stenosis in the left carotid artery. Right carotid is 40-59% stenosis. She is being followed by Dr. Arbie Cookey. She was placed on anticoagulation therapy with Eliquis. She was discharged to home with Home Health PT/OT and ST. She is still having ST. She reports some word finding difficulty and some cognitive slowing.  REVIEW OF SYSTEMS: Full 14 system review of systems performed and notable only for: ringing in ears, light sensitivity, memory loss, speech difficulty, joint swelling, back pain, walking difficulty  ALLERGIES: Allergies  Allergen Reactions  . Oxycodone Nausea And Vomiting    HOME MEDICATIONS: Outpatient Prescriptions Prior to Visit  Medication Sig Dispense Refill  . albuterol (PROVENTIL HFA;VENTOLIN HFA) 108 (90 BASE) MCG/ACT inhaler Inhale 2 puffs into the lungs every 6 (six)  hours as needed for wheezing or shortness of breath.  1 Inhaler  0  . amLODipine (NORVASC) 10 MG tablet Take 1 tablet (10 mg total) by mouth daily.  90 tablet  2  . apixaban (ELIQUIS) 5 MG TABS tablet Take 1 tablet (5 mg total) by mouth 2 (two) times daily.  60 tablet  3  . atorvastatin (LIPITOR) 20 MG tablet Take 1 tablet (20 mg total) by mouth daily.  30 tablet  3  . cyclobenzaprine (FLEXERIL) 5 MG tablet Take 1 tablet (5 mg total) by mouth 3 (three) times daily as needed.  30 tablet  0  . ferrous sulfate 325 (65 FE) MG tablet Take 325 mg by mouth daily with breakfast.      . fluticasone (FLONASE) 50 MCG/ACT nasal spray Place 2 sprays into the nose daily.  16 g  3  . furosemide (LASIX) 40 MG tablet Take 1 tablet (40 mg total) by mouth 2 (two) times daily.  180 tablet  3  . gabapentin (NEURONTIN) 300 MG capsule Take 1 capsule (300 mg total) by mouth 3 (three) times daily.  270 capsule  3  . insulin aspart (NOVOLOG) 100 UNIT/ML injection Before each meal 3 times a day, 140-199 - 2 units, 200-250 - 4 units, 251-299 - 6 units,  300-349 - 8 units,  350 or above 10 units. Insulin PEN if approved, provide syringes and needles if needed.  10 mL  11  . insulin glargine (LANTUS) 100 UNIT/ML injection Inject 0.5 mLs (50 Units total) into the skin at bedtime.  30 mL  3  . isosorbide mononitrate (ISMO,MONOKET) 10 MG tablet Take 1 tablet (10 mg total) by  mouth 2 (two) times daily.  180 tablet  3  . latanoprost (XALATAN) 0.005 % ophthalmic solution Place 1 drop into both eyes at bedtime.       Marland Kitchen levothyroxine (LEVOTHROID) 150 MCG tablet Take 1 tablet (150 mcg total) by mouth daily.  90 tablet  3  . lisinopril (PRINIVIL,ZESTRIL) 10 MG tablet Take 1 tablet (10 mg total) by mouth daily.  90 tablet  1  . meclizine (ANTIVERT) 25 MG tablet Take 1 tablet (25 mg total) by mouth 2 (two) times daily. As needed for dizziness or nausea  60 tablet  3  . metFORMIN (GLUCOPHAGE) 500 MG tablet Take 1 tablet (500 mg total) by  mouth 2 (two) times daily.  180 tablet  3  . metoprolol tartrate (LOPRESSOR) 25 MG tablet Take 1 tablet (25 mg total) by mouth 2 (two) times daily as needed.  180 tablet  3  . nitroGLYCERIN (NITROSTAT) 0.4 MG SL tablet Place 1 tablet (0.4 mg total) under the tongue every 5 (five) minutes as needed.  25 tablet  3  . potassium chloride SA (K-DUR,KLOR-CON) 20 MEQ tablet Take 1 tablet (20 mEq total) by mouth 2 (two) times daily.  180 tablet  3  . traMADol (ULTRAM) 50 MG tablet Take 100 mg by mouth every 8 (eight) hours as needed for moderate pain.      Marland Kitchen omeprazole (PRILOSEC) 20 MG capsule Take 1 capsule (20 mg total) by mouth 2 (two) times daily.  180 capsule  3   No facility-administered medications prior to visit.    PHYSICAL EXAM Filed Vitals:   12/31/13 1612  BP: 160/67  Pulse: 70  Height: 5' 2.5" (1.588 m)  Weight: 226 lb (102.513 kg)   Body mass index is 40.65 kg/(m^2).  Visual Acuity Screening   Right eye Left eye Both eyes  Without correction: 20/40 20/70-1   With correction:      No flowsheet data found.  MMSE - Mini Mental State Exam 12/31/2013  Orientation to time 4  Orientation to Place 5  Registration 3  Attention/ Calculation 4  Recall 3  Language- name 2 objects 1  Language- repeat 1  Language- follow 3 step command 1  Language- read & follow direction 1  Write a sentence 1  Copy design 0  Total score 24   Generalized: Obese Caucasian female in no acute distress  Head: normocephalic and atraumatic. Oropharynx benign  Neck: Supple, no carotid bruits  Cardiac: Irregular rate rhythm, no murmur  Musculoskeletal: No deformity   Neurological examination  Mentation: Alert oriented to time, place, history taking. Follows all commands speech is clear with mild word-finding difficulty.  Tearful at times, geriatric depression score of 7 suggests depression. Cranial nerve II-XII: Fundoscopic exam not done. Pupils were equal round reactive to light extraocular movements  were full, visual field were full on confrontational test. Facial sensation and strength were normal. Hearing was intact to finger rubbing bilaterally. Uvula tongue midline. head turning and shoulder shrug and were normal and symmetric.Tongue protrusion into cheek strength was normal. Motor: The motor testing reveals 5 over 5 strength of all 4 extremities. Poor effort in LLE due to knee pain.  Good symmetric motor tone is noted throughout.  Sensory: Sensory testing is intact to soft touch on all 4 extremities. No evidence of extinction is noted.  Coordination: Cerebellar testing reveals good finger-nose-finger, heel-to-shin bilaterally .  Gait and station: Gait is antalgic due to left knee pain. Romberg is negative. Not able to  toe, heel or tandem walk due to left knee. Reflexes: Deep tendon reflexes are symmetric and normal bilaterally.  NIHSS: 1 mRs: 1  DIAGNOSTIC DATA (LABS, IMAGING, TESTING) - I reviewed patient records, labs, notes, testing and imaging myself where available.  Lab Results  Component Value Date   WBC 5.6 10/25/2013   HGB 10.7* 10/25/2013   HCT 32.3* 10/25/2013   MCV 85.2 10/25/2013   PLT 236 10/25/2013      Component Value Date/Time   NA 140 10/22/2013 0626   K 3.7 10/22/2013 0626   CL 102 10/22/2013 0626   CO2 22 10/22/2013 0626   GLUCOSE 147* 10/22/2013 0626   BUN 16 10/22/2013 0626   CREATININE 0.81 10/22/2013 0626   CALCIUM 8.8 10/22/2013 0626   PROT 7.3 10/21/2013 1403   ALBUMIN 3.4* 10/21/2013 1403   AST 14 10/21/2013 1403   ALT 7 10/21/2013 1403   ALKPHOS 102 10/21/2013 1403   BILITOT 1.1 10/21/2013 1403   GFRNONAA 69* 10/22/2013 0626   GFRAA 80* 10/22/2013 0626   Lab Results  Component Value Date   CHOL 131 10/22/2013   HDL 36* 10/22/2013   LDLCALC 67 10/22/2013   LDLDIRECT 92.7 01/14/2013   TRIG 142 10/22/2013   CHOLHDL 3.6 10/22/2013   Lab Results  Component Value Date   HGBA1C 7.7* 10/22/2013   Lab Results  Component Value Date   TSH 0.72 10/06/2013    ASSESSMENT: [name] is a 79 y.o. female with pmh HTN, HLD, DM, CAD, atrial flutter presenting with mixed expressive and receptive aphasia and R sided weakness on 10/22/13. Imaging confirms acute L MCA infarct, underlying etiology likely secondary to atrial fibrillation not on anticoagulation.   PLAN: I had a long discussion with the patient and daughter regarding her recent stroke, discussed results of evaluation in the hospital and answered questions. Continue eliquis (apixaban) for secondary stroke prevention and maintain strict control of hypertension with blood pressure goal below 140/90, diabetes with hemoglobin A1c goal below 7% and lipids with LDL cholesterol goal below 100 mg/dL. She was advised on healthy diet and reduced portions for weight loss and better glucose control.  Followup in the future with Dr. Roda Shutters in 3 months. Sooner as needed.  Tawny Asal LAM, MSN, FNP-BC, A/GNP-C 12/31/2013, 4:52 PM [facility] [address] [place], [place] [address] [phone]  Note: This document was prepared with digital dictation and possible smart phrase technology. Any transcriptional errors that result from this process are unintentional.

## 2014-01-10 ENCOUNTER — Ambulatory Visit (INDEPENDENT_AMBULATORY_CARE_PROVIDER_SITE_OTHER): Payer: Commercial Managed Care - HMO | Admitting: Internal Medicine

## 2014-01-10 ENCOUNTER — Encounter: Payer: Self-pay | Admitting: Internal Medicine

## 2014-01-10 VITALS — BP 120/62 | HR 69 | Temp 98.2°F | Resp 12 | Ht 62.5 in | Wt 221.5 lb

## 2014-01-10 DIAGNOSIS — I10 Essential (primary) hypertension: Secondary | ICD-10-CM

## 2014-01-10 DIAGNOSIS — E1159 Type 2 diabetes mellitus with other circulatory complications: Secondary | ICD-10-CM

## 2014-01-10 DIAGNOSIS — I63239 Cerebral infarction due to unspecified occlusion or stenosis of unspecified carotid arteries: Secondary | ICD-10-CM

## 2014-01-10 DIAGNOSIS — I798 Other disorders of arteries, arterioles and capillaries in diseases classified elsewhere: Secondary | ICD-10-CM

## 2014-01-10 DIAGNOSIS — R1013 Epigastric pain: Secondary | ICD-10-CM

## 2014-01-10 DIAGNOSIS — I999 Unspecified disorder of circulatory system: Secondary | ICD-10-CM

## 2014-01-10 DIAGNOSIS — Z0000 Encounter for general adult medical examination without abnormal findings: Secondary | ICD-10-CM

## 2014-01-10 DIAGNOSIS — F411 Generalized anxiety disorder: Secondary | ICD-10-CM

## 2014-01-10 DIAGNOSIS — E1165 Type 2 diabetes mellitus with hyperglycemia: Secondary | ICD-10-CM

## 2014-01-10 DIAGNOSIS — K219 Gastro-esophageal reflux disease without esophagitis: Secondary | ICD-10-CM

## 2014-01-10 LAB — HM DIABETES FOOT EXAM: HM Diabetic Foot Exam: NORMAL

## 2014-01-10 LAB — GLUCOSE, POCT (MANUAL RESULT ENTRY): POC GLUCOSE: 131 mg/dL — AB (ref 70–99)

## 2014-01-10 MED ORDER — APIXABAN 5 MG PO TABS: 5.0000 mg | ORAL_TABLET | Freq: Two times a day (BID) | ORAL | Status: DC

## 2014-01-10 MED ORDER — OMEPRAZOLE 20 MG PO CPDR: 20.0000 mg | DELAYED_RELEASE_CAPSULE | Freq: Two times a day (BID) | ORAL | Status: DC

## 2014-01-10 MED ORDER — SERTRALINE HCL 25 MG PO TABS: 25.0000 mg | ORAL_TABLET | Freq: Every day | ORAL | Status: DC

## 2014-01-10 NOTE — Assessment & Plan Note (Signed)
Lab Results  Component Value Date   HGBA1C 7.7* 10/22/2013   Will plan recheck of A1c in 01/2014. Continue current medications.

## 2014-01-10 NOTE — Addendum Note (Signed)
Addended by: Chandra Batch E on: 01/10/2014 10:33 AM   Modules accepted: Orders

## 2014-01-10 NOTE — Assessment & Plan Note (Signed)
General medical exam including breast exam normal except as noted. Encouraged healthy diet and exercise as tolerated. Mammogram declined. Flu vaccine this fall.

## 2014-01-10 NOTE — Assessment & Plan Note (Signed)
Recent worsening symptoms of anxiety. Will start Sertraline 25mg  daily at bedtime. Follow up in 4 weeks or sooner as needed.

## 2014-01-10 NOTE — Assessment & Plan Note (Signed)
Recent decreased appetite and epigastric pain noted on exam. On Omeprazole. Will check H. Pylori testing today. If symptoms persist, will plan to set up referral for EGD.

## 2014-01-10 NOTE — Progress Notes (Signed)
The patient is here for annual Medicare Wellness Examination and management of other chronic and acute problems.   The risk factors are reflected in the history.  The roster of all physicians providing medical care to patient - is listed in the Snapshot section of the chart.  Activities of daily living:   The patient is 100% independent in all ADLs: dressing, toileting, feeding as well as independent mobility. Patient lives alone in a home. Has 2 dogs and a bird.  Home safety :  The patient has smoke detectors in the home.  They wear seatbelts in their car. There are no firearms at home.  There is no violence in the home. They feel safe where they live.  Infectious Risks: There is no risks for hepatitis, STDs or HIV.  There is no  history of blood transfusion.  They have no travel history to infectious disease endemic areas of the world.  Additional Health Care Providers: The patient has not seen their dentist in the last six months. Dentist - none They have seen their eye doctor in the last year. Opthalmologist - [facility]. Follow up scheduled in 03/2014.  They deny hearing issues. They have deferred audiologic testing in the last year.   They do not  have excessive sun exposure. Discussed the need for sun protection: hats,long sleeves and use of sunscreen if there is significant sun exposure.  Dermatologist - none Neurology - Heide Guile, NP  Diet: the importance of a healthy diet is discussed. They do have a healthy diet.  The benefits of regular aerobic exercise were discussed. Patient exercises occasionally. Recently completed PT/OT. Continues on Speech Therapy.  Depression screen: there are no signs or vegative symptoms of depression- irritability, change in appetite, anhedonia, sadness/tearfullness.  Cognitive assessment: the patient manages all their financial and personal affairs and is actively engaged.   HCPOA - none, would prefer for daughter to make  decisions   The following portions of the patient's history were reviewed and updated as appropriate: allergies, current medications, past family history, past medical history,  past surgical history, past social history and problem list.  Visual acuity was not assessed per patient preference as they have regular follow up with their ophthalmologist. Hearing and body mass index were assessed and reviewed.   During the course of the visit the patient was educated and counseled about appropriate screening and preventive services including : fall prevention , diabetes screening, nutrition counseling, colorectal cancer screening, and recommended immunizations.    Feeling more anxious lately. Would like to try medication to help with this.   Notes some decreased and occasional epigastric pain. No change in bowel movements. Taking Omeprazole bid.  Review of Systems  Constitutional: Positive for appetite change. Negative for fever, chills, fatigue and unexpected weight change.  Eyes: Negative for visual disturbance.  Respiratory: Positive for cough (occasional dry). Negative for shortness of breath.   Cardiovascular: Negative for chest pain and leg swelling.  Gastrointestinal: Positive for abdominal pain. Negative for nausea, vomiting, diarrhea and constipation.  Musculoskeletal: Positive for arthralgias, back pain and myalgias.  Skin: Negative for color change and rash.  Neurological: Negative for weakness.  Hematological: Negative for adenopathy. Does not bruise/bleed easily.  Psychiatric/Behavioral: Positive for dysphoric mood. Negative for sleep disturbance. The patient is nervous/anxious.        Objective:    BP 120/62  Pulse 69  Temp(Src) 98.2 F (36.8 C) (Oral)  Resp 12  Ht 5' 2.5" (1.588 m)  Wt 221 lb  8 oz (100.472 kg)  BMI 39.84 kg/m2  SpO2 98% Physical Exam  Constitutional: She is oriented to person, place, and time. She appears well-developed and well-nourished. No  distress.  HENT:  Head: Normocephalic and atraumatic.  Right Ear: External ear normal.  Left Ear: External ear normal.  Nose: Nose normal.  Mouth/Throat: Oropharynx is clear and moist. No oropharyngeal exudate.  Eyes: Conjunctivae are normal. Pupils are equal, round, and reactive to light. Right eye exhibits no discharge. Left eye exhibits no discharge. No scleral icterus.  Neck: Normal range of motion. Neck supple. No tracheal deviation present. No thyromegaly present.  Cardiovascular: Normal rate, regular rhythm, normal heart sounds and intact distal pulses.  Exam reveals no gallop and no friction rub.   No murmur heard. Pulmonary/Chest: Effort normal and breath sounds normal. No accessory muscle usage. Not tachypneic. No respiratory distress. She has no decreased breath sounds. She has no wheezes. She has no rales. She exhibits no tenderness. Right breast exhibits no inverted nipple, no mass, no nipple discharge, no skin change and no tenderness. Left breast exhibits no inverted nipple, no mass, no nipple discharge, no skin change and no tenderness. Breasts are symmetrical.  Abdominal: Soft. Bowel sounds are normal. She exhibits no distension and no mass. There is tenderness (mild epigastric). There is no rebound and no guarding.  Musculoskeletal: Normal range of motion. She exhibits no edema and no tenderness.  Lymphadenopathy:    She has no cervical adenopathy.  Neurological: She is alert and oriented to person, place, and time. No cranial nerve deficit. She exhibits normal muscle tone. Coordination normal.  Skin: Skin is warm and dry. No rash noted. She is not diaphoretic. No erythema. No pallor.  Psychiatric: She has a normal mood and affect. Her behavior is normal. Judgment and thought content normal.          Assessment & Plan:   Problem List Items Addressed This Visit     High   CVA (cerebral infarction)   Relevant Medications      apixaban (ELIQUIS) tablet   Hypertension       BP Readings from Last 3 Encounters:  01/10/14 120/62  12/31/13 160/67  12/23/13 118/72   BP well controlled on current medications. Will continue.    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 Continue current medications.    Relevant Medications      apixaban (ELIQUIS) tablet     Unprioritized   Abdominal pain, epigastric     Recent decreased appetite and epigastric pain noted on exam. On Omeprazole. Will check H. Pylori testing today. If symptoms persist, will plan to set up referral for EGD.    Relevant Orders      H. pylori breath test   Anxiety state, unspecified     Recent worsening symptoms of anxiety. Will start Sertraline 25mg  daily at bedtime. Follow up in 4 weeks or sooner as needed.    Relevant Medications      sertraline (ZOLOFT) tablet   GERD (gastroesophageal reflux disease)   Relevant Medications      omeprazole (PRILOSEC) capsule   Medicare annual wellness visit, subsequent - Primary     General medical exam including breast exam normal except as noted. Encouraged healthy diet and exercise as tolerated.  Mammogram declined. Flu vaccine this fall.         Return in about 4 weeks (around 02/07/2014) for Recheck of Diabetes.

## 2014-01-10 NOTE — Patient Instructions (Addendum)
Start Sertraline 32m daily at bedtime.  Health Maintenance Adopting a healthy lifestyle and getting preventive care can go a long way to promote health and wellness. Talk with your health care provider about what schedule of regular examinations is right for you. This is a good chance for you to check in with your provider about disease prevention and staying healthy. In between checkups, there are plenty of things you can do on your own. Experts have done a lot of research about which lifestyle changes and preventive measures are most likely to keep you healthy. Ask your health care provider for more information. WEIGHT AND DIET  Eat a healthy diet  Be sure to include plenty of vegetables, fruits, low-fat dairy products, and lean protein.  Do not eat a lot of foods high in solid fats, added sugars, or salt.  Get regular exercise. This is one of the most important things you can do for your health.  Most adults should exercise for at least 150 minutes each week. The exercise should increase your heart rate and make you sweat (moderate-intensity exercise).  Most adults should also do strengthening exercises at least twice a week. This is in addition to the moderate-intensity exercise.  Maintain a healthy weight  Body mass index (BMI) is a measurement that can be used to identify possible weight problems. It estimates body fat based on height and weight. Your health care provider can help determine your BMI and help you achieve or maintain a healthy weight.  For females 238years of age and older:   A BMI below 18.5 is considered underweight.  A BMI of 18.5 to 24.9 is normal.  A BMI of 25 to 29.9 is considered overweight.  A BMI of 30 and above is considered obese.  Watch levels of cholesterol and blood lipids  You should start having your blood tested for lipids and cholesterol at 77years of age, then have this test every 5 years.  You may need to have your cholesterol levels  checked more often if:  Your lipid or cholesterol levels are high.  You are older than 77years of age.  You are at high risk for heart disease.  CANCER SCREENING   Lung Cancer  Lung cancer screening is recommended for adults [id number] old who are at high risk for lung cancer because of a history of smoking.  A yearly low-dose CT scan of the lungs is recommended for people who:  Currently smoke.  Have quit within the past 15 years.  Have at least a 30-pack-year history of smoking. A pack year is smoking an average of one pack of cigarettes a day for 1 year.  Yearly screening should continue until it has been 15 years since you quit.  Yearly screening should stop if you develop a health problem that would prevent you from having lung cancer treatment.  Breast Cancer  Practice breast self-awareness. This means understanding how your breasts normally appear and feel.  It also means doing regular breast self-exams. Let your health care provider know about any changes, no matter how small.  If you are in your 20s or 30s, you should have a clinical breast exam (CBE) by a health care provider every 1-3 years as part of a regular health exam.  If you are 423or older, have a CBE every year. Also consider having a breast X-ray (mammogram) every year.  If you have a family history of breast cancer, talk to your health care provider  about genetic screening.  If you are at high risk for breast cancer, talk to your health care provider about having an MRI and a mammogram every year.  Breast cancer gene (BRCA) assessment is recommended for women who have family members with BRCA-related cancers. BRCA-related cancers include:  Breast.  Ovarian.  Tubal.  Peritoneal cancers.  Results of the assessment will determine the need for genetic counseling and BRCA1 and BRCA2 testing. Cervical Cancer Routine pelvic examinations to screen for cervical cancer are no longer recommended for  nonpregnant women who are considered low risk for cancer of the pelvic organs (ovaries, uterus, and vagina) and who do not have symptoms. A pelvic examination may be necessary if you have symptoms including those associated with pelvic infections. Ask your health care provider if a screening pelvic exam is right for you.   The Pap test is the screening test for cervical cancer for women who are considered at risk.  If you had a hysterectomy for a problem that was not cancer or a condition that could lead to cancer, then you no longer need Pap tests.  If you are older than 65 years, and you have had normal Pap tests for the past 10 years, you no longer need to have Pap tests.  If you have had past treatment for cervical cancer or a condition that could lead to cancer, you need Pap tests and screening for cancer for at least 20 years after your treatment.  If you no longer get a Pap test, assess your risk factors if they change (such as having a new sexual partner). This can affect whether you should start being screened again.  Some women have medical problems that increase their chance of getting cervical cancer. If this is the case for you, your health care provider may recommend more frequent screening and Pap tests.  The human papillomavirus (HPV) test is another test that may be used for cervical cancer screening. The HPV test looks for the virus that can cause cell changes in the cervix. The cells collected during the Pap test can be tested for HPV.  The HPV test can be used to screen women 6 years of age and older. Getting tested for HPV can extend the interval between normal Pap tests from three to five years.  An HPV test also should be used to screen women of any age who have unclear Pap test results.  After 77 years of age, women should have HPV testing as often as Pap tests.  Colorectal Cancer  This type of cancer can be detected and often prevented.  Routine colorectal cancer  screening usually begins at 77 years of age and continues through 77 years of age.  Your health care provider may recommend screening at an earlier age if you have risk factors for colon cancer.  Your health care provider may also recommend using home test kits to check for hidden blood in the stool.  A small camera at the end of a tube can be used to examine your colon directly (sigmoidoscopy or colonoscopy). This is done to check for the earliest forms of colorectal cancer.  Routine screening usually begins at age 21.  Direct examination of the colon should be repeated every 5-10 years through 77 years of age. However, you may need to be screened more often if early forms of precancerous polyps or small growths are found. Skin Cancer  Check your skin from head to toe regularly.  Tell your health care provider  about any new moles or changes in moles, especially if there is a change in a mole's shape or color.  Also tell your health care provider if you have a mole that is larger than the size of a pencil eraser.  Always use sunscreen. Apply sunscreen liberally and repeatedly throughout the day.  Protect yourself by wearing long sleeves, pants, a wide-brimmed hat, and sunglasses whenever you are outside. HEART DISEASE, DIABETES, AND HIGH BLOOD PRESSURE   Have your blood pressure checked at least every 1-2 years. High blood pressure causes heart disease and increases the risk of stroke.  If you are between 55 years and 79 years old, ask your health care provider if you should take aspirin to prevent strokes.  Have regular diabetes screenings. This involves taking a blood sample to check your fasting blood sugar level.  If you are at a normal weight and have a low risk for diabetes, have this test once every three years after 77 years of age.  If you are overweight and have a high risk for diabetes, consider being tested at a younger age or more often. PREVENTING INFECTION  Hepatitis  B  If you have a higher risk for hepatitis B, you should be screened for this virus. You are considered at high risk for hepatitis B if:  You were born in a country where hepatitis B is common. Ask your health care provider which countries are considered high risk.  Your parents were born in a high-risk country, and you have not been immunized against hepatitis B (hepatitis B vaccine).  You have HIV or AIDS.  You use needles to inject street drugs.  You live with someone who has hepatitis B.  You have had sex with someone who has hepatitis B.  You get hemodialysis treatment.  You take certain medicines for conditions, including cancer, organ transplantation, and autoimmune conditions. Hepatitis C  Blood testing is recommended for:  Everyone born from 1945 through 1965.  Anyone with known risk factors for hepatitis C. Sexually transmitted infections (STIs)  You should be screened for sexually transmitted infections (STIs) including gonorrhea and chlamydia if:  You are sexually active and are younger than 77 years of age.  You are older than 77 years of age and your health care provider tells you that you are at risk for this type of infection.  Your sexual activity has changed since you were last screened and you are at an increased risk for chlamydia or gonorrhea. Ask your health care provider if you are at risk.  If you do not have HIV, but are at risk, it may be recommended that you take a prescription medicine daily to prevent HIV infection. This is called pre-exposure prophylaxis (PrEP). You are considered at risk if:  You are sexually active and do not regularly use condoms or know the HIV status of your partner(s).  You take drugs by injection.  You are sexually active with a partner who has HIV. Talk with your health care provider about whether you are at high risk of being infected with HIV. If you choose to begin PrEP, you should first be tested for HIV. You should  then be tested every 3 months for as long as you are taking PrEP.  PREGNANCY   If you are premenopausal and you may become pregnant, ask your health care provider about preconception counseling.  If you may become pregnant, take 400 to 800 micrograms (mcg) of folic acid every day.  If you   want to prevent pregnancy, talk to your health care provider about birth control (contraception). OSTEOPOROSIS AND MENOPAUSE   Osteoporosis is a disease in which the bones lose minerals and strength with aging. This can result in serious bone fractures. Your risk for osteoporosis can be identified using a bone density scan.  If you are 70 years of age or older, or if you are at risk for osteoporosis and fractures, ask your health care provider if you should be screened.  Ask your health care provider whether you should take a calcium or vitamin D supplement to lower your risk for osteoporosis.  Menopause may have certain physical symptoms and risks.  Hormone replacement therapy may reduce some of these symptoms and risks. Talk to your health care provider about whether hormone replacement therapy is right for you.  HOME CARE INSTRUCTIONS   Schedule regular health, dental, and eye exams.  Stay current with your immunizations.   Do not use any tobacco products including cigarettes, chewing tobacco, or electronic cigarettes.  If you are pregnant, do not drink alcohol.  If you are breastfeeding, limit how much and how often you drink alcohol.  Limit alcohol intake to no more than 1 drink per day for nonpregnant women. One drink equals 12 ounces of beer, 5 ounces of wine, or 1 ounces of hard liquor.  Do not use street drugs.  Do not share needles.  Ask your health care provider for help if you need support or information about quitting drugs.  Tell your health care provider if you often feel depressed.  Tell your health care provider if you have ever been abused or do not feel safe at  home. Document Released: 11/19/2010 Document Revised: 09/20/2013 Document Reviewed: 04/07/2013 [facility] Patient Information 2015 [place], [place]. This information is not intended to replace advice given to you by your health care provider. Make sure you discuss any questions you have with your health care provider.

## 2014-01-10 NOTE — Assessment & Plan Note (Signed)
BP Readings from Last 3 Encounters:  01/10/14 120/62  12/31/13 160/67  12/23/13 118/72   BP well controlled on current medications. Will continue.

## 2014-01-10 NOTE — Progress Notes (Signed)
Pre visit review using our clinic review tool, if applicable. No additional management support is needed unless otherwise documented below in the visit note. 

## 2014-01-11 ENCOUNTER — Encounter: Payer: Self-pay | Admitting: *Deleted

## 2014-01-11 LAB — H. PYLORI BREATH TEST: H. PYLORI BREATH TEST: NOT DETECTED

## 2014-01-19 ENCOUNTER — Telehealth: Payer: Self-pay | Admitting: Internal Medicine

## 2014-01-19 DIAGNOSIS — I63239 Cerebral infarction due to unspecified occlusion or stenosis of unspecified carotid arteries: Secondary | ICD-10-CM

## 2014-01-19 MED ORDER — APIXABAN 5 MG PO TABS: 5.0000 mg | ORAL_TABLET | Freq: Two times a day (BID) | ORAL | Status: DC

## 2014-01-19 NOTE — Telephone Encounter (Signed)
Pt called in and stated needing a refill of eliquis. She stated she is supposed to have a delivery of that medication but said costs to much and wants it to be called in to [facility].

## 2014-01-20 ENCOUNTER — Telehealth: Payer: Self-pay | Admitting: *Deleted

## 2014-01-20 NOTE — Telephone Encounter (Signed)
Notified home health nurse

## 2014-01-20 NOTE — Telephone Encounter (Signed)
Home health nurse called requesting verbal orders for home health speech therapy for 2 times weekly for 4 weeks

## 2014-01-20 NOTE — Telephone Encounter (Signed)
That is fine 

## 2014-01-31 ENCOUNTER — Encounter: Payer: Self-pay | Admitting: Cardiovascular Disease

## 2014-01-31 ENCOUNTER — Encounter (INDEPENDENT_AMBULATORY_CARE_PROVIDER_SITE_OTHER): Payer: Commercial Managed Care - HMO | Admitting: Cardiovascular Disease

## 2014-01-31 VITALS — BP 120/58 | HR 72 | Ht 63.0 in | Wt 220.0 lb

## 2014-01-31 DIAGNOSIS — I158 Other secondary hypertension: Secondary | ICD-10-CM | POA: Diagnosis not present

## 2014-01-31 DIAGNOSIS — I4892 Unspecified atrial flutter: Secondary | ICD-10-CM

## 2014-01-31 DIAGNOSIS — I209 Angina pectoris, unspecified: Secondary | ICD-10-CM

## 2014-01-31 DIAGNOSIS — I25119 Atherosclerotic heart disease of native coronary artery with unspecified angina pectoris: Secondary | ICD-10-CM

## 2014-01-31 DIAGNOSIS — E785 Hyperlipidemia, unspecified: Secondary | ICD-10-CM

## 2014-01-31 DIAGNOSIS — I2510 Atherosclerotic heart disease of native coronary artery without angina pectoris: Secondary | ICD-10-CM | POA: Diagnosis not present

## 2014-01-31 DIAGNOSIS — E669 Obesity, unspecified: Secondary | ICD-10-CM

## 2014-01-31 DIAGNOSIS — I6529 Occlusion and stenosis of unspecified carotid artery: Secondary | ICD-10-CM

## 2014-01-31 DIAGNOSIS — I63239 Cerebral infarction due to unspecified occlusion or stenosis of unspecified carotid arteries: Secondary | ICD-10-CM

## 2014-01-31 NOTE — Assessment & Plan Note (Signed)
Currently with no symptoms of angina. No further workup at this time. Continue current medication regimen. 

## 2014-01-31 NOTE — Assessment & Plan Note (Signed)
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 Etiology unclear though felt to be embolic. Carotid arterial disease seen, prior history of arrhythmia. Tolerating eliquis

## 2014-01-31 NOTE — Assessment & Plan Note (Signed)
We have encouraged continued exercise, careful diet management in an effort to lose weight. 

## 2014-01-31 NOTE — Assessment & Plan Note (Signed)
Cholesterol is at goal on the current lipid regimen. No changes to the medications were made.  

## 2014-01-31 NOTE — Assessment & Plan Note (Signed)
Moderate disease on the right. We'll continue aggressive cholesterol management

## 2014-01-31 NOTE — Progress Notes (Signed)
Patient ID: [name] [name], female    DOB: 06/27/1934, 79 y.o.   MRN: [id number]  HPI Comments: [name] [name] is a 79 year old woman with a history of CAD, status post PTCA/cutting balloon angioplasty for in-stent restenosis of the RCA,  Who presents for routine followup.  admitted to the hospital at the end of May 17 2013 with discharge 05/18/2013 with diagnosis of chest pain.  stress test showed no ischemia.   In followup, he reports that she had a stroke in June 2015. 4.8 cm lesion seen on MRI She had vision and speech deficits. She has been participating in rehabilitation Workup included carotid ultrasound showing 40-59% disease on the right, minimal disease on the left Echocardiogram was essentially normal with ejection fraction 60% Etiology of the stroke is still uncertain no concerning for arrhythmia and she was started on anticoagulation, eliquis 5 mg twice a day.  Total cholesterol 170, LDL 96. In the past she has not been faithfully taking her simvastatin  Hemoglobin A1c 7.7   episode of chest pain in 2012, admitted to the hospital, workup including cardiac enzymes and a stress test. Stress test showed no ischemia and she was discharged home She has significant orthopedic problems. She had a fall in the recent past, broke her left shoulder. Scheduled for surgery on September 18.  history of total right knee replacement,  also history of spinal stenosis. She has mild chronic shortness of breath.   EKG shows normal sinus rhythm ,  rate of 72 and beats per minute , ST and T wave abnormality in lead one and aVL       Outpatient Encounter Prescriptions as of 01/31/2014  Medication Sig  . albuterol (PROVENTIL HFA;VENTOLIN HFA) 108 (90 BASE) MCG/ACT inhaler Inhale 2 puffs into the lungs every 6 (six) hours as needed for wheezing or shortness of breath.  Marland Kitchen amLODipine (NORVASC) 10 MG tablet Take 1 tablet (10 mg total) by mouth daily.  Marland Kitchen apixaban (ELIQUIS) 5 MG TABS tablet Take 1  tablet (5 mg total) by mouth 2 (two) times daily.  Marland Kitchen atorvastatin (LIPITOR) 20 MG tablet Take 1 tablet (20 mg total) by mouth daily.  . cyclobenzaprine (FLEXERIL) 5 MG tablet Take 1 tablet (5 mg total) by mouth 3 (three) times daily as needed.  . ferrous sulfate 325 (65 FE) MG tablet Take 325 mg by mouth daily with breakfast.  . fluticasone (FLONASE) 50 MCG/ACT nasal spray Place 2 sprays into the nose daily.  . furosemide (LASIX) 40 MG tablet Take 1 tablet (40 mg total) by mouth 2 (two) times daily.  Marland Kitchen gabapentin (NEURONTIN) 300 MG capsule Take 1 capsule (300 mg total) by mouth 3 (three) times daily.  . insulin aspart (NOVOLOG) 100 UNIT/ML injection Before each meal 3 times a day, 140-199 - 2 units, 200-250 - 4 units, 251-299 - 6 units,  300-349 - 8 units,  350 or above 10 units. Insulin PEN if approved, provide syringes and needles if needed.  . insulin glargine (LANTUS) 100 UNIT/ML injection Inject 0.5 mLs (50 Units total) into the skin at bedtime.  . isosorbide mononitrate (ISMO,MONOKET) 10 MG tablet Take 1 tablet (10 mg total) by mouth 2 (two) times daily.  Marland Kitchen latanoprost (XALATAN) 0.005 % ophthalmic solution Place 1 drop into both eyes at bedtime.   Marland Kitchen levothyroxine (LEVOTHROID) 150 MCG tablet Take 1 tablet (150 mcg total) by mouth daily.  Marland Kitchen lisinopril (PRINIVIL,ZESTRIL) 10 MG tablet Take 1 tablet (10 mg total) by mouth daily.  Marland Kitchen  meclizine (ANTIVERT) 25 MG tablet Take 1 tablet (25 mg total) by mouth 2 (two) times daily. As needed for dizziness or nausea  . metFORMIN (GLUCOPHAGE) 500 MG tablet Take 1 tablet (500 mg total) by mouth 2 (two) times daily.  . metoprolol tartrate (LOPRESSOR) 25 MG tablet Take 1 tablet (25 mg total) by mouth 2 (two) times daily as needed.  . nitroGLYCERIN (NITROSTAT) 0.4 MG SL tablet Place 1 tablet (0.4 mg total) under the tongue every 5 (five) minutes as needed.  Marland Kitchen omeprazole (PRILOSEC) 20 MG capsule Take 1 capsule (20 mg total) by mouth 2 (two) times daily.  .  potassium chloride SA (K-DUR,KLOR-CON) 20 MEQ tablet Take 1 tablet (20 mEq total) by mouth 2 (two) times daily.  . sertraline (ZOLOFT) 25 MG tablet Take 1 tablet (25 mg total) by mouth daily.  . traMADol (ULTRAM) 50 MG tablet Take 100 mg by mouth every 8 (eight) hours as needed for moderate pain.   Review of Systems  Constitutional: Negative.   HENT: Negative.   Eyes: Negative.   Respiratory: Negative.   Cardiovascular: Negative.   Gastrointestinal: Negative.   Endocrine: Negative.   Musculoskeletal: Positive for arthralgias, back pain, gait problem and joint swelling.  Skin: Negative.   Allergic/Immunologic: Negative.   Neurological: Negative.        Speech and vision deficits, now almost resolved  Hematological: Negative.   Psychiatric/Behavioral: Negative.   All other systems reviewed and are negative.   BP 120/58  Pulse 72  Ht 5\' 3"  (1.6 m)  Wt 220 lb (99.791 kg)  BMI 38.98 kg/m2  Physical Exam  Nursing note and vitals reviewed. Constitutional: She is oriented to person, place, and time. She appears well-developed and well-nourished.  Obese  HENT:  Head: Normocephalic.  Nose: Nose normal.  Mouth/Throat: Oropharynx is clear and moist.  Eyes: Conjunctivae are normal. Pupils are equal, round, and reactive to light.  Neck: Normal range of motion. Neck supple. No JVD present.  Cardiovascular: Normal rate, regular rhythm, S1 normal, S2 normal, normal heart sounds and intact distal pulses.  Exam reveals no gallop and no friction rub.   No murmur heard. Pulmonary/Chest: Effort normal and breath sounds normal. No respiratory distress. She has no wheezes. She has no rales. She exhibits no tenderness.  Abdominal: Soft. Bowel sounds are normal. She exhibits no distension. There is no tenderness.  Musculoskeletal: Normal range of motion. She exhibits no edema and no tenderness.  Lymphadenopathy:    She has no cervical adenopathy.  Neurological: She is alert and oriented to person,  place, and time. Coordination normal.  Skin: Skin is warm and dry. No rash noted. No erythema.  Psychiatric: She has a normal mood and affect. Her behavior is normal. Judgment and thought content normal.    Assessment and Plan

## 2014-01-31 NOTE — Assessment & Plan Note (Signed)
Blood pressure is well controlled on today's visit. No changes made to the medications. 

## 2014-01-31 NOTE — Patient Instructions (Signed)
You are doing well. No medication changes were made.  Please call us if you have new issues that need to be addressed before your next appt.  Your physician wants you to follow-up in: 6 months.  You will receive a reminder letter in the mail two months in advance. If you don't receive a letter, please call our office to schedule the follow-up appointment.   

## 2014-01-31 NOTE — Assessment & Plan Note (Signed)
Maintaining normal sinus rhythm. No recent documentation of arrhythmia though she has had a stroke. Currently tolerating anticoagulation

## 2014-02-06 ENCOUNTER — Emergency Department: Payer: Self-pay | Admitting: Internal Medicine

## 2014-02-06 LAB — CBC WITH DIFFERENTIAL/PLATELET
BASOS ABS: 0.1 10*3/uL (ref 0.0–0.1)
BASOS PCT: 0.7 %
Eosinophil #: 0.1 10*3/uL (ref 0.0–0.7)
Eosinophil %: 0.7 %
HCT: 39.5 % (ref 35.0–47.0)
HGB: 13 g/dL (ref 12.0–16.0)
LYMPHS PCT: 21.6 %
Lymphocyte #: 2.4 10*3/uL (ref 1.0–3.6)
MCH: 29.6 pg (ref 26.0–34.0)
MCHC: 32.9 g/dL (ref 32.0–36.0)
MCV: 90 fL (ref 80–100)
MONOS PCT: 7.2 %
Monocyte #: 0.8 x10 3/mm (ref 0.2–0.9)
NEUTROS ABS: 7.6 10*3/uL — AB (ref 1.4–6.5)
Neutrophil %: 69.8 %
Platelet: 303 10*3/uL (ref 150–440)
RBC: 4.39 10*6/uL (ref 3.80–5.20)
RDW: 14.2 % (ref 11.5–14.5)
WBC: 10.9 10*3/uL (ref 3.6–11.0)

## 2014-02-06 LAB — URINALYSIS, COMPLETE
BILIRUBIN, UR: NEGATIVE
GLUCOSE, UR: NEGATIVE mg/dL (ref 0–75)
Ketone: NEGATIVE
NITRITE: NEGATIVE
PROTEIN: NEGATIVE
Ph: 5 (ref 4.5–8.0)
SPECIFIC GRAVITY: 1.005 (ref 1.003–1.030)

## 2014-02-06 LAB — BASIC METABOLIC PANEL
Anion Gap: 12 (ref 7–16)
BUN: 22 mg/dL — AB (ref 7–18)
CALCIUM: 8 mg/dL — AB (ref 8.5–10.1)
Chloride: 108 mmol/L — ABNORMAL HIGH (ref 98–107)
Co2: 20 mmol/L — ABNORMAL LOW (ref 21–32)
Creatinine: 1.2 mg/dL (ref 0.60–1.30)
EGFR (African American): 51 — ABNORMAL LOW
EGFR (Non-African Amer.): 44 — ABNORMAL LOW
GLUCOSE: 277 mg/dL — AB (ref 65–99)
Osmolality: 293 (ref 275–301)
POTASSIUM: 4.7 mmol/L (ref 3.5–5.1)
Sodium: 140 mmol/L (ref 136–145)

## 2014-02-06 LAB — PROTIME-INR
INR: 1.2
Prothrombin Time: 15 secs — ABNORMAL HIGH (ref 11.5–14.7)

## 2014-02-06 LAB — HEPATIC FUNCTION PANEL A (ARMC)
ALBUMIN: 3 g/dL — AB (ref 3.4–5.0)
ALK PHOS: 99 U/L
ALT: 18 U/L
BILIRUBIN TOTAL: 0.5 mg/dL (ref 0.2–1.0)
SGOT(AST): 32 U/L (ref 15–37)
Total Protein: 6.9 g/dL (ref 6.4–8.2)

## 2014-02-06 LAB — TROPONIN I: Troponin-I: <0.02 ng/mL

## 2014-02-10 ENCOUNTER — Telehealth: Payer: Self-pay

## 2014-02-10 ENCOUNTER — Ambulatory Visit: Payer: Commercial Managed Care - HMO | Admitting: Internal Medicine

## 2014-02-10 NOTE — Telephone Encounter (Signed)
Called patient today to check on her r/t cancelled appointment per Dr. Tilman Neat request. Spoke to patient and asked how she was doing since her ED visit this weekend. Patient stated she couldn't make her appointment to because the road she usually takes to the office was closed due to construction, had to take a different route and got lost. Patient also stated  that she is feeling a little better since her ED visit but is still feeling very dizzy. Patient is aware that she has appointment schedule with Dr. Dan Humphreys on Oct. 1, 2015 at 3pm.

## 2014-02-14 ENCOUNTER — Telehealth: Payer: Self-pay

## 2014-02-14 NOTE — Telephone Encounter (Signed)
This patient sees Dr. Dan Humphreys

## 2014-02-14 NOTE — Telephone Encounter (Signed)
Need a [facility] referral for dr. Oren Bracket

## 2014-02-14 NOTE — Telephone Encounter (Signed)
The patient called and is needing a [employer] ref to Dr.Sydnor

## 2014-02-17 ENCOUNTER — Encounter: Payer: Self-pay | Admitting: Internal Medicine

## 2014-02-17 ENCOUNTER — Telehealth: Payer: Self-pay | Admitting: *Deleted

## 2014-02-17 ENCOUNTER — Telehealth: Payer: Self-pay | Admitting: Internal Medicine

## 2014-02-17 ENCOUNTER — Other Ambulatory Visit: Payer: Self-pay | Admitting: *Deleted

## 2014-02-17 ENCOUNTER — Ambulatory Visit (INDEPENDENT_AMBULATORY_CARE_PROVIDER_SITE_OTHER): Payer: Commercial Managed Care - HMO | Admitting: Internal Medicine

## 2014-02-17 VITALS — BP 104/60 | HR 116 | Temp 98.2°F | Ht 62.5 in | Wt 226.5 lb

## 2014-02-17 DIAGNOSIS — E1165 Type 2 diabetes mellitus with hyperglycemia: Secondary | ICD-10-CM

## 2014-02-17 DIAGNOSIS — E1151 Type 2 diabetes mellitus with diabetic peripheral angiopathy without gangrene: Secondary | ICD-10-CM

## 2014-02-17 DIAGNOSIS — E1159 Type 2 diabetes mellitus with other circulatory complications: Secondary | ICD-10-CM

## 2014-02-17 DIAGNOSIS — I482 Chronic atrial fibrillation, unspecified: Secondary | ICD-10-CM

## 2014-02-17 DIAGNOSIS — R002 Palpitations: Secondary | ICD-10-CM

## 2014-02-17 DIAGNOSIS — R42 Dizziness and giddiness: Secondary | ICD-10-CM

## 2014-02-17 MED ORDER — DILTIAZEM HCL ER COATED BEADS 120 MG PO CP24: 120.0000 mg | ORAL_CAPSULE | Freq: Every day | ORAL | Status: DC

## 2014-02-17 NOTE — Patient Instructions (Signed)
Labs today.  We will call you once we talk with your cardiologist.  Follow up in 4 weeks.

## 2014-02-17 NOTE — Assessment & Plan Note (Signed)
Will check A1c with labs today. Continue current medications. 

## 2014-02-17 NOTE — Telephone Encounter (Signed)
Home health nurse, Hailey, left message stating that pt HR was irregular (ranging from 96-84 to 104-108) wanted to make sure that we assess her heart rate today at her office visit

## 2014-02-17 NOTE — Assessment & Plan Note (Signed)
Recent vertigo may be related to afib. Currently asymptomatic. Will monitor for recurrent symptoms as we adjust medications to control rate.

## 2014-02-17 NOTE — Progress Notes (Signed)
Subjective:    Patient ID: [name] [name], female    DOB: March 14, 1935, 79 y.o.   MRN: [id number]  HPI 79YO female presents for follow up after ER visit for vertigo.  Went to ED last Sunday with vertigo. Had CT head which was normal. Discharged on Antivert.  No vertigo after this episode. Generally has been feeling well at home.  Home health nurse noted irregular heart rate. Pt has also noticed some palpitations. No chest pain. Occasional shortness of breath, unchanged from previous.  DM - Did not bring record of BG today. Reports compliance with medication.  Review of Systems  Constitutional: Negative for fever, chills, appetite change, fatigue and unexpected weight change.  Eyes: Negative for visual disturbance.  Respiratory: Positive for shortness of breath. Negative for cough and chest tightness.   Cardiovascular: Positive for palpitations. Negative for chest pain and leg swelling.  Gastrointestinal: Negative for nausea, vomiting, abdominal pain, diarrhea and constipation.  Musculoskeletal: Positive for arthralgias and myalgias.  Skin: Negative for color change and rash.  Hematological: Negative for adenopathy. Does not bruise/bleed easily.  Psychiatric/Behavioral: Negative for dysphoric mood. The patient is not nervous/anxious.        Objective:    BP 104/60  Pulse 116  Temp(Src) 98.2 F (36.8 C) (Oral)  Ht 5' 2.5" (1.588 m)  Wt 226 lb 8 oz (102.74 kg)  BMI 40.74 kg/m2  SpO2 95% Physical Exam  Constitutional: She is oriented to person, place, and time. She appears well-developed and well-nourished. No distress.  HENT:  Head: Normocephalic and atraumatic.  Right Ear: External ear normal.  Left Ear: External ear normal.  Nose: Nose normal.  Mouth/Throat: Oropharynx is clear and moist. No oropharyngeal exudate.  Eyes: Conjunctivae are normal. Pupils are equal, round, and reactive to light. Right eye exhibits no discharge. Left eye exhibits no discharge. No scleral  icterus.  Neck: Normal range of motion. Neck supple. No tracheal deviation present. No thyromegaly present.  Cardiovascular: Normal rate, normal heart sounds and intact distal pulses.  An irregularly irregular rhythm present. Exam reveals no gallop and no friction rub.   No murmur heard. Pulmonary/Chest: Effort normal and breath sounds normal. No accessory muscle usage. Not tachypneic. No respiratory distress. She has no decreased breath sounds. She has no wheezes. She has no rhonchi. She has no rales. She exhibits no tenderness.  Musculoskeletal: Normal range of motion. She exhibits no edema and no tenderness.  Lymphadenopathy:    She has no cervical adenopathy.  Neurological: She is alert and oriented to person, place, and time. No cranial nerve deficit. She exhibits normal muscle tone. Coordination normal.  Skin: Skin is warm and dry. No rash noted. She is not diaphoretic. No erythema. No pallor.  Psychiatric: She has a normal mood and affect. Her behavior is normal. Judgment and thought content normal.          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 Continue current medications.    Relevant Orders      Comprehensive metabolic panel      Hemoglobin A1c      Lipid panel      Microalbumin / creatinine urine ratio     Unprioritized   Atrial fibrillation     EKG shows AFIB with rapid rate. Discussed with Dr. Mariah Milling. Will STOP Isosorbide and  Amlodipine and start Diltiazem 120mg  daily. Continue Eliquis for anticoagulation. Follow up 1 week.    Vertigo     Recent vertigo may be related to afib. Currently asymptomatic. Will monitor for recurrent symptoms as we adjust medications to control rate.     Other Visit Diagnoses   Palpitations    -  Primary    Relevant Orders       EKG 12-Lead (Completed)        Return in about 4 weeks  (around 03/17/2014) for Recheck.

## 2014-02-17 NOTE — Telephone Encounter (Signed)
I spoke with Dr. Mariah Milling. He would like for her to STOP her Amlodipine and Isosorbide. We will start Diltiazem 120mg  daily. Follow up next week either with or Dr. [place].

## 2014-02-17 NOTE — Assessment & Plan Note (Signed)
EKG shows AFIB with rapid rate. Discussed with Dr. Mariah Milling. Will STOP Isosorbide and Amlodipine and start Diltiazem 120mg  daily. Continue Eliquis for anticoagulation. Follow up 1 week.

## 2014-02-17 NOTE — Telephone Encounter (Signed)
Notified pt. 

## 2014-02-17 NOTE — Progress Notes (Signed)
Pre visit review using our clinic review tool, if applicable. No additional management support is needed unless otherwise documented below in the visit note. 

## 2014-02-18 ENCOUNTER — Encounter: Payer: Self-pay | Admitting: *Deleted

## 2014-02-18 LAB — COMPREHENSIVE METABOLIC PANEL
ALT: 21 U/L (ref 0–35)
AST: 16 U/L (ref 0–37)
Albumin: 3.7 g/dL (ref 3.5–5.2)
Alkaline Phosphatase: 103 U/L (ref 39–117)
BILIRUBIN TOTAL: 0.7 mg/dL (ref 0.2–1.2)
BUN: 21 mg/dL (ref 6–23)
CHLORIDE: 106 meq/L (ref 96–112)
CO2: 28 mEq/L (ref 19–32)
CREATININE: 1 mg/dL (ref 0.4–1.2)
Calcium: 9.1 mg/dL (ref 8.4–10.5)
GFR: 59.93 mL/min — ABNORMAL LOW (ref >60.00)
Glucose, Bld: 214 mg/dL — ABNORMAL HIGH (ref 70–99)
Potassium: 4.4 mEq/L (ref 3.5–5.1)
SODIUM: 141 meq/L (ref 135–145)
TOTAL PROTEIN: 6.9 g/dL (ref 6.0–8.3)

## 2014-02-18 LAB — HEMOGLOBIN A1C: Hgb A1c MFr Bld: 8.1 % — ABNORMAL HIGH (ref 4.6–6.5)

## 2014-02-18 LAB — MICROALBUMIN / CREATININE URINE RATIO
Creatinine,U: 51.3 mg/dL
Microalb Creat Ratio: 9.4 mg/g (ref 0.0–30.0)
Microalb, Ur: 4.8 mg/dL — ABNORMAL HIGH (ref 0.0–1.9)

## 2014-02-18 LAB — LIPID PANEL
CHOL/HDL RATIO: 3
Cholesterol: 140 mg/dL (ref 0–200)
HDL: 42.3 mg/dL (ref >39.00)
LDL CALC: 69 mg/dL (ref 0–99)
NONHDL: 97.7
TRIGLYCERIDES: 145 mg/dL (ref 0.0–149.0)
VLDL: 29 mg/dL (ref 0.0–40.0)

## 2014-02-21 ENCOUNTER — Telehealth: Payer: Self-pay | Admitting: *Deleted

## 2014-02-21 ENCOUNTER — Telehealth: Payer: Self-pay | Admitting: Internal Medicine

## 2014-02-21 NOTE — Telephone Encounter (Signed)
We should try then to get her in with Dr. Mariah Milling. They will have the ability to repeat ECHO there if needed. And, he was making the adjustments to her meds.

## 2014-02-21 NOTE — Telephone Encounter (Signed)
That is fine 

## 2014-02-21 NOTE — Telephone Encounter (Signed)
Appt scheduled at Dr. Windell Hummingbird office tomorrow at 2:15. Pt notified and verbalized understanding.

## 2014-02-21 NOTE — Telephone Encounter (Signed)
Rhetta Mura, home health nurse, is asking for verbal orders to continue home health.

## 2014-02-21 NOTE — Telephone Encounter (Signed)
Patient Information:  Caller Name: [name] [name]  Phone: [phone]  Patient: [name] [name]  Gender: Female  DOB: [birth year]  Age: 79 Years  PCP: Ronna Polio (Adults only)  Office Follow Up:  Does the office need to follow up with this patient?: No  Instructions For The Office: N/A  RN Note:  Pt. scheduled for Dr. Dan Humphreys on 02/22/14 at 09:15. Call back if worsens. Advised to wait on family to be in the home before being up walking, in case of SOB and fall.  Symptoms  Reason For Call & Symptoms: Amy from [facility] calling.In to see Dr. Dan Humphreys on 02/17/14 with increased Heartrate. Took her off some of her medications and started on Cardizem. S/P Stroke. Does not feel well. SOB on exertion.States wheezing when lying down.Vitals are stable. P 98. Pulse is irregular. Pt. takes Eliquis.  Reviewed Health History In EMR: Yes  Reviewed Medications In EMR: Yes  Reviewed Allergies In EMR: Yes  Reviewed Surgeries / Procedures: Yes  Date of Onset of Symptoms: 02/17/2014  Guideline(s) Used:  Asthma Attack  Disposition Per Guideline:   See Today or Tomorrow in Office  Reason For Disposition Reached:   Mild asthma attack (e.g., no SOB at rest, mild SOB with walking, speaks normally in sentences, mild wheezing) and persists > 24 hours on appropriate treatment  Advice Given:  Drinking Liquids:  Try to drink normal amount of liquids (e.g., water). Being adequately hydrated makes it easier to cough up the sticky lung mucus.  Call Back If:  You become worse.  Patient Will Follow Care Advice:  YES  Appointment Scheduled:  02/22/2014 09:15:00 Appointment Scheduled Provider:  Ronna Polio (Adults only)

## 2014-02-21 NOTE — Telephone Encounter (Signed)
Needs to be seen in the ED today if short of breath. May have worsening heart failure from AFIB with RVR.

## 2014-02-21 NOTE — Telephone Encounter (Signed)
Appt scheduled for tomorrow.  °

## 2014-02-21 NOTE — Telephone Encounter (Signed)
Pt declines to be seen in ED today, advised I would let Dr. Dan Humphreys know. States she will just come to appointment tomorrow morning.

## 2014-02-22 ENCOUNTER — Ambulatory Visit: Payer: Self-pay | Admitting: Internal Medicine

## 2014-02-22 ENCOUNTER — Ambulatory Visit (INDEPENDENT_AMBULATORY_CARE_PROVIDER_SITE_OTHER): Payer: Commercial Managed Care - HMO | Admitting: Cardiovascular Disease

## 2014-02-22 ENCOUNTER — Encounter: Payer: Self-pay | Admitting: Cardiovascular Disease

## 2014-02-22 VITALS — BP 112/68 | HR 77 | Ht 63.0 in | Wt 231.5 lb

## 2014-02-22 DIAGNOSIS — E1159 Type 2 diabetes mellitus with other circulatory complications: Secondary | ICD-10-CM

## 2014-02-22 DIAGNOSIS — E1165 Type 2 diabetes mellitus with hyperglycemia: Secondary | ICD-10-CM

## 2014-02-22 DIAGNOSIS — E1151 Type 2 diabetes mellitus with diabetic peripheral angiopathy without gangrene: Secondary | ICD-10-CM

## 2014-02-22 DIAGNOSIS — I480 Paroxysmal atrial fibrillation: Secondary | ICD-10-CM

## 2014-02-22 DIAGNOSIS — I158 Other secondary hypertension: Secondary | ICD-10-CM

## 2014-02-22 DIAGNOSIS — R0602 Shortness of breath: Secondary | ICD-10-CM

## 2014-02-22 DIAGNOSIS — I25119 Atherosclerotic heart disease of native coronary artery with unspecified angina pectoris: Secondary | ICD-10-CM

## 2014-02-22 DIAGNOSIS — R079 Chest pain, unspecified: Secondary | ICD-10-CM

## 2014-02-22 NOTE — Assessment & Plan Note (Addendum)
Uncertain why she converted to atrial fibrillation in the third week of September 2015. She denies any triggers . Seems like she initially had nausea, dizziness and she went to the emergency room . Workup was negative apart from atrial fibrillation . Heart rate and blood pressure are relatively well-controlled today. We will continue her on a diltiazem and eliquis.  Coupon for eliquis provided today.  We discussed the next step in several weeks' time which potentially could be pharmacologic cardioversion, followed by [facility] if needed.

## 2014-02-22 NOTE — Assessment & Plan Note (Signed)
Currently with no symptoms of angina. No further workup at this time. Continue current medication regimen. 

## 2014-02-22 NOTE — Assessment & Plan Note (Signed)
Amlodipine and isosorbide recently held. Diltiazem started. Blood pressure stable

## 2014-02-22 NOTE — Patient Instructions (Signed)
You are doing well. No medication changes were made.  Please take extra lasix as needed for any worsening SOB Stay on the lasix at least twice a day  Please call us if you have new issues that need to be addressed before your next appt.  Your physician wants you to follow-up in: 3 weeks

## 2014-02-22 NOTE — Assessment & Plan Note (Signed)
Recommended weight loss, diet change

## 2014-02-22 NOTE — Progress Notes (Signed)
Patient ID: [name] [name], female    DOB: 31-Jan-1935, 79 y.o.   MRN: [id number]  HPI Comments: [name] [name] is a 79 year old woman with a history of CAD, status post PTCA/cutting balloon angioplasty for in-stent restenosis of the RCA,  Who presents for routine followup. history of total right knee replacement,  also history of spinal stenosis. Recently seen by Dr. walker. Found to be in atrial fibrillation starting between September 14 and February 06 2014.  She was seen in the hospital 02/06/2014 with dizziness, general weakness, nausea, blood sugars 307 CT scan of the head showed old stroke, incidental meningioma EKG showed atrial fibrillation  She reports a 7-8 pound weight gain Her amlodipine was held, isosorbide was held. She was started on diltiazem 120 mg daily for heart rate control She continues to have occasional palpitations, mildly weak but otherwise feels well She and her friend are concerned about recent weight gain. She continues to take her diuretic twice a day, denies drinking excessively  She has mild chronic shortness of breath. She denies any chest pain, anginal-type symptoms, no lightheadedness or dizziness  She walks with a cane.  admitted to the hospital at the end of May 17 2013 with discharge 05/18/2013 with diagnosis of chest pain. Cardiac enzymes were negative. She had stress test that showed no ischemia.   Repeat hospital admission 06/12/2011 with discharge 06/12/2013. Admitted with chest pain. She had cardiac catheterization that showed 60% mid RCA disease, FFR performed with the results of 0.86, no intervention done. Also with 75% ramus disease, 90% small OM disease. Medical management was recommended  Prior lab work; Total cholesterol 170, LDL 96.    episode of chest pain in 2012, admitted to the hospital, workup including cardiac enzymes and a stress test. Stress test showed no ischemia and she was discharged home She has significant orthopedic  problems. She had a fall in the recent past, broke her left shoulder. Scheduled for surgery on September 18.  Previous Hemoglobin A1c 8.3. After cortisone shot   EKG shows atrial fibrillation with rate 77 up to 98 beats per minute, nonspecific ST abnormality       Outpatient Encounter Prescriptions as of 02/22/2014  Medication Sig  . albuterol (PROVENTIL HFA;VENTOLIN HFA) 108 (90 BASE) MCG/ACT inhaler Inhale 2 puffs into the lungs every 6 (six) hours as needed for wheezing or shortness of breath.  Marland Kitchen apixaban (ELIQUIS) 5 MG TABS tablet Take 1 tablet (5 mg total) by mouth 2 (two) times daily.  Marland Kitchen atorvastatin (LIPITOR) 20 MG tablet Take 1 tablet (20 mg total) by mouth daily.  . cyclobenzaprine (FLEXERIL) 5 MG tablet Take 1 tablet (5 mg total) by mouth 3 (three) times daily as needed.  . diltiazem (CARDIZEM CD) 120 MG 24 hr capsule Take 1 capsule (120 mg total) by mouth daily.  . ferrous sulfate 325 (65 FE) MG tablet Take 325 mg by mouth daily with breakfast.  . fluticasone (FLONASE) 50 MCG/ACT nasal spray Place 2 sprays into the nose daily.  . furosemide (LASIX) 40 MG tablet Take 1 tablet (40 mg total) by mouth 2 (two) times daily.  Marland Kitchen gabapentin (NEURONTIN) 300 MG capsule Take 1 capsule (300 mg total) by mouth 3 (three) times daily.  . insulin aspart (NOVOLOG) 100 UNIT/ML injection Before each meal 3 times a day, 140-199 - 2 units, 200-250 - 4 units, 251-299 - 6 units,  300-349 - 8 units,  350 or above 10 units. Insulin PEN if approved, provide syringes and needles  if needed.  . insulin glargine (LANTUS) 100 UNIT/ML injection Inject 0.5 mLs (50 Units total) into the skin at bedtime.  Marland Kitchen latanoprost (XALATAN) 0.005 % ophthalmic solution Place 1 drop into both eyes at bedtime.   Marland Kitchen levothyroxine (LEVOTHROID) 150 MCG tablet Take 1 tablet (150 mcg total) by mouth daily.  Marland Kitchen lisinopril (PRINIVIL,ZESTRIL) 10 MG tablet Take 1 tablet (10 mg total) by mouth daily.  . meclizine (ANTIVERT) 25 MG tablet Take  1 tablet (25 mg total) by mouth 2 (two) times daily. As needed for dizziness or nausea  . metFORMIN (GLUCOPHAGE) 500 MG tablet Take 1 tablet (500 mg total) by mouth 2 (two) times daily.  . metoprolol tartrate (LOPRESSOR) 25 MG tablet Take 1 tablet (25 mg total) by mouth 2 (two) times daily as needed.  . nitroGLYCERIN (NITROSTAT) 0.4 MG SL tablet Place 1 tablet (0.4 mg total) under the tongue every 5 (five) minutes as needed.  Marland Kitchen omeprazole (PRILOSEC) 20 MG capsule Take 1 capsule (20 mg total) by mouth 2 (two) times daily.  . potassium chloride SA (K-DUR,KLOR-CON) 20 MEQ tablet Take 1 tablet (20 mEq total) by mouth 2 (two) times daily.  . sertraline (ZOLOFT) 25 MG tablet Take 1 tablet (25 mg total) by mouth daily.  . traMADol (ULTRAM) 50 MG tablet Take 100 mg by mouth every 8 (eight) hours as needed for moderate pain.    Review of Systems  HENT: Negative.   Eyes: Negative.   Respiratory: Positive for shortness of breath.   Cardiovascular: Negative.   Gastrointestinal: Negative.   Endocrine: Negative.   Musculoskeletal: Positive for arthralgias, back pain, gait problem and joint swelling.  Skin: Negative.   Allergic/Immunologic: Negative.   Neurological: Positive for weakness.  Hematological: Negative.   Psychiatric/Behavioral: Negative.   All other systems reviewed and are negative.   BP 112/68  Pulse 77  Ht 5\' 3"  (1.6 m)  Wt 231 lb 8 oz (105.008 kg)  BMI 41.02 kg/m2  Physical Exam  Nursing note and vitals reviewed. Constitutional: She is oriented to person, place, and time. She appears well-developed and well-nourished.  Obese  HENT:  Head: Normocephalic.  Nose: Nose normal.  Mouth/Throat: Oropharynx is clear and moist.  Eyes: Conjunctivae are normal. Pupils are equal, round, and reactive to light.  Neck: Normal range of motion. Neck supple. No JVD present.  Cardiovascular: Normal rate, S1 normal, S2 normal, normal heart sounds and intact distal pulses.  An irregularly  irregular rhythm present. Exam reveals no gallop and no friction rub.   No murmur heard. Pulmonary/Chest: Effort normal and breath sounds normal. No respiratory distress. She has no wheezes. She has no rales. She exhibits no tenderness.  Abdominal: Soft. Bowel sounds are normal. She exhibits no distension. There is no tenderness.  Musculoskeletal: Normal range of motion. She exhibits no edema and no tenderness.  Lymphadenopathy:    She has no cervical adenopathy.  Neurological: She is alert and oriented to person, place, and time. Coordination normal.  Skin: Skin is warm and dry. No rash noted. No erythema.  Psychiatric: She has a normal mood and affect. Her behavior is normal. Judgment and thought content normal.    Assessment and Plan

## 2014-02-22 NOTE — Assessment & Plan Note (Signed)
We have encouraged continued exercise, careful diet management in an effort to lose weight. 

## 2014-02-24 NOTE — Telephone Encounter (Signed)
Spoke with Amy, confirmed verbal order had already been given. States she left another VM yesterday, requesting verbal order for skilled nursing to evaluate and treat for CHF education. Verbal order given.

## 2014-03-15 ENCOUNTER — Encounter: Payer: Self-pay | Admitting: Cardiovascular Disease

## 2014-03-15 ENCOUNTER — Ambulatory Visit (INDEPENDENT_AMBULATORY_CARE_PROVIDER_SITE_OTHER): Payer: Commercial Managed Care - HMO | Admitting: Cardiovascular Disease

## 2014-03-15 VITALS — BP 122/64 | HR 74 | Ht 63.0 in | Wt 229.0 lb

## 2014-03-15 DIAGNOSIS — I25119 Atherosclerotic heart disease of native coronary artery with unspecified angina pectoris: Secondary | ICD-10-CM

## 2014-03-15 DIAGNOSIS — R0602 Shortness of breath: Secondary | ICD-10-CM

## 2014-03-15 DIAGNOSIS — I158 Other secondary hypertension: Secondary | ICD-10-CM

## 2014-03-15 DIAGNOSIS — I480 Paroxysmal atrial fibrillation: Secondary | ICD-10-CM

## 2014-03-15 DIAGNOSIS — E1165 Type 2 diabetes mellitus with hyperglycemia: Secondary | ICD-10-CM

## 2014-03-15 DIAGNOSIS — E1151 Type 2 diabetes mellitus with diabetic peripheral angiopathy without gangrene: Secondary | ICD-10-CM

## 2014-03-15 DIAGNOSIS — E785 Hyperlipidemia, unspecified: Secondary | ICD-10-CM

## 2014-03-15 DIAGNOSIS — E1159 Type 2 diabetes mellitus with other circulatory complications: Secondary | ICD-10-CM

## 2014-03-15 MED ORDER — AMIODARONE HCL 200 MG PO TABS: 200.0000 mg | ORAL_TABLET | Freq: Two times a day (BID) | ORAL | Status: DC

## 2014-03-15 NOTE — Assessment & Plan Note (Signed)
Encouraged her to stay on her Lipitor 

## 2014-03-15 NOTE — Assessment & Plan Note (Signed)
Long discussion today concerning the treatment options for her atrial fibrillation. We'll start amiodarone 400 mg twice a day for 5 days then down to 200 mg twice a day EKG in 2 weeks' time. Continue on her other medications as she is taking currently

## 2014-03-15 NOTE — Patient Instructions (Addendum)
   You are still in atrial fibrillation Please stay on the diltiazem one a day (green pill)  Please start amiodarone 2 pills twice a day for 5 days Then down to one pill twice a day   Please call us if you have new issues that need to be addressed before your next appt.  Your physician wants you to follow-up in: 2 weeks

## 2014-03-15 NOTE — Progress Notes (Signed)
Patient ID: [name] [name], female    DOB: Jul 29, 1934, 79 y.o.   MRN: [id number]  HPI Comments: [name] [name] is a 79 year old woman with a history of CAD, status post PTCA/cutting balloon angioplasty for in-stent restenosis of the RCA,  Who presents for routine followup. history of total right knee replacement,  also history of spinal stenosis.  Found to be in atrial fibrillation starting between September 14 and February 06 2014.   in the hospital 02/06/2014 with dizziness, general weakness, nausea, blood sugars 307 CT scan of the head showed old stroke, incidental meningioma EKG showed atrial fibrillation  Her amlodipine was held, isosorbide was held. She was started on diltiazem 120 mg daily for heart rate control  In followup today, she has severe hip pain, walking with a cane. Pain has been getting worse over the past several weeks She denies any significant weight change, minimal leg edema. She denies any chest pain, anginal-type symptoms, no lightheadedness or dizziness   admitted to the hospital at the end of May 17 2013 with discharge 05/18/2013 with diagnosis of chest pain. Cardiac enzymes were negative. She had stress test that showed no ischemia.   Repeat hospital admission 06/12/2011 with discharge 06/12/2013. Admitted with chest pain. She had cardiac catheterization that showed 60% mid RCA disease, FFR performed with the results of 0.86, no intervention done. Also with 75% ramus disease, 90% small OM disease. Medical management was recommended  Prior lab work; Total cholesterol 170, LDL 96.    episode of chest pain in 2012, admitted to the hospital, workup including cardiac enzymes and a stress test. Stress test showed no ischemia and she was discharged home She has significant orthopedic problems. She had a fall in the recent past, broke her left shoulder. Scheduled for surgery on September 18.  Previous Hemoglobin A1c 8.3. After cortisone shot   EKG shows atrial  fibrillation with rate 74  beats per minute, nonspecific ST abnormality       Outpatient Encounter Prescriptions as of 03/15/2014  Medication Sig  . albuterol (PROVENTIL HFA;VENTOLIN HFA) 108 (90 BASE) MCG/ACT inhaler Inhale 2 puffs into the lungs every 6 (six) hours as needed for wheezing or shortness of breath.  Marland Kitchen apixaban (ELIQUIS) 5 MG TABS tablet Take 1 tablet (5 mg total) by mouth 2 (two) times daily.  Marland Kitchen atorvastatin (LIPITOR) 20 MG tablet Take 1 tablet (20 mg total) by mouth daily.  . cyclobenzaprine (FLEXERIL) 5 MG tablet Take 1 tablet (5 mg total) by mouth 3 (three) times daily as needed.  . diltiazem (CARDIZEM CD) 120 MG 24 hr capsule Take 1 capsule (120 mg total) by mouth daily.  . ferrous sulfate 325 (65 FE) MG tablet Take 325 mg by mouth daily with breakfast.  . fluticasone (FLONASE) 50 MCG/ACT nasal spray Place 2 sprays into the nose daily.  . furosemide (LASIX) 40 MG tablet Take 1 tablet (40 mg total) by mouth 2 (two) times daily.  Marland Kitchen gabapentin (NEURONTIN) 300 MG capsule Take 1 capsule (300 mg total) by mouth 3 (three) times daily.  . insulin aspart (NOVOLOG) 100 UNIT/ML injection Before each meal 3 times a day, 140-199 - 2 units, 200-250 - 4 units, 251-299 - 6 units,  300-349 - 8 units,  350 or above 10 units. Insulin PEN if approved, provide syringes and needles if needed.  . insulin glargine (LANTUS) 100 UNIT/ML injection Inject 0.5 mLs (50 Units total) into the skin at bedtime.  Marland Kitchen latanoprost (XALATAN) 0.005 % ophthalmic solution Place  1 drop into both eyes at bedtime.   Marland Kitchen levothyroxine (LEVOTHROID) 150 MCG tablet Take 1 tablet (150 mcg total) by mouth daily.  Marland Kitchen lisinopril (PRINIVIL,ZESTRIL) 10 MG tablet Take 1 tablet (10 mg total) by mouth daily.  . meclizine (ANTIVERT) 25 MG tablet Take 1 tablet (25 mg total) by mouth 2 (two) times daily. As needed for dizziness or nausea  . metFORMIN (GLUCOPHAGE) 500 MG tablet Take 1 tablet (500 mg total) by mouth 2 (two) times daily.  .  metoprolol tartrate (LOPRESSOR) 25 MG tablet Take 1 tablet (25 mg total) by mouth 2 (two) times daily as needed.  . nitroGLYCERIN (NITROSTAT) 0.4 MG SL tablet Place 1 tablet (0.4 mg total) under the tongue every 5 (five) minutes as needed.  Marland Kitchen omeprazole (PRILOSEC) 20 MG capsule Take 1 capsule (20 mg total) by mouth 2 (two) times daily.  . potassium chloride SA (K-DUR,KLOR-CON) 20 MEQ tablet Take 1 tablet (20 mEq total) by mouth 2 (two) times daily.  . sertraline (ZOLOFT) 25 MG tablet Take 1 tablet (25 mg total) by mouth daily.  . traMADol (ULTRAM) 50 MG tablet Take 100 mg by mouth every 8 (eight) hours as needed for moderate pain.    Review of Systems  HENT: Negative.   Eyes: Negative.   Respiratory: Positive for shortness of breath.   Cardiovascular: Negative.   Gastrointestinal: Negative.   Endocrine: Negative.   Musculoskeletal: Positive for arthralgias, back pain, gait problem and joint swelling.  Skin: Negative.   Allergic/Immunologic: Negative.   Neurological: Positive for weakness.  Hematological: Negative.   Psychiatric/Behavioral: Negative.   All other systems reviewed and are negative.   BP 122/64  Pulse 74  Ht 5\' 3"  (1.6 m)  Wt 229 lb (103.874 kg)  BMI 40.58 kg/m2  Physical Exam  Nursing note and vitals reviewed. Constitutional: She is oriented to person, place, and time. She appears well-developed and well-nourished.  Obese  HENT:  Head: Normocephalic.  Nose: Nose normal.  Mouth/Throat: Oropharynx is clear and moist.  Eyes: Conjunctivae are normal. Pupils are equal, round, and reactive to light.  Neck: Normal range of motion. Neck supple. No JVD present.  Cardiovascular: Normal rate, S1 normal, S2 normal, normal heart sounds and intact distal pulses.  An irregularly irregular rhythm present. Exam reveals no gallop and no friction rub.   No murmur heard. Pulmonary/Chest: Effort normal and breath sounds normal. No respiratory distress. She has no wheezes. She has  no rales. She exhibits no tenderness.  Abdominal: Soft. Bowel sounds are normal. She exhibits no distension. There is no tenderness.  Musculoskeletal: Normal range of motion. She exhibits no edema and no tenderness.  Lymphadenopathy:    She has no cervical adenopathy.  Neurological: She is alert and oriented to person, place, and time. Coordination normal.  Skin: Skin is warm and dry. No rash noted. No erythema.  Psychiatric: She has a normal mood and affect. Her behavior is normal. Judgment and thought content normal.    Assessment and Plan

## 2014-03-15 NOTE — Assessment & Plan Note (Signed)
Currently with no symptoms of angina. No further workup at this time. Continue current medication regimen. 

## 2014-03-15 NOTE — Assessment & Plan Note (Signed)
Blood pressure is well controlled on today's visit. No changes made to the medications. 

## 2014-03-15 NOTE — Assessment & Plan Note (Signed)
We have encouraged continued exercise, careful diet management in an effort to lose weight. She is limited by her severe hip pain

## 2014-03-17 ENCOUNTER — Telehealth: Payer: Self-pay | Admitting: Cardiovascular Disease

## 2014-03-17 NOTE — Telephone Encounter (Signed)
Pt had question about med's, and a nurse is with her, please call back .

## 2014-03-17 NOTE — Telephone Encounter (Signed)
Spoke w/ pt.  She states that she was advised to take 2 amiodarone BID for 5 days, then decrease to 1 pill BID, but her prescription bottle did not instruct her to do this.  Advised her that her bottle would not indicate this and to follow Dr. Windell Hummingbird instructions provided on her AVS.  She verbalizes understanding and will call back w/ further questions or concerns.

## 2014-03-18 ENCOUNTER — Telehealth: Payer: Self-pay | Admitting: *Deleted

## 2014-03-18 NOTE — Telephone Encounter (Signed)
OK. We can talk about the Sertraline at her visit.

## 2014-03-18 NOTE — Telephone Encounter (Signed)
Rhetta Mura, home health speech therapist, called to let us know that pt had a missed home visit yesterday since the pt was not feeling well, pt's blood sugar was 219 & pt was very unfocused, pt has a scheduled visitation today.  Pt is not being compliant on taking her Sertraline, she has not refilled medication since you prescribed it in Aug., Nurse feels that she needs to be taking the sertraline and also feels like the dose needs to be increased.  Pt is scheduled to see you next week on 03/25/14.

## 2014-03-25 ENCOUNTER — Ambulatory Visit (INDEPENDENT_AMBULATORY_CARE_PROVIDER_SITE_OTHER): Payer: Medicare HMO | Admitting: Internal Medicine

## 2014-03-25 ENCOUNTER — Encounter: Payer: Self-pay | Admitting: Internal Medicine

## 2014-03-25 VITALS — BP 122/60 | HR 69 | Temp 97.9°F | Ht 62.5 in | Wt 229.2 lb

## 2014-03-25 DIAGNOSIS — E1165 Type 2 diabetes mellitus with hyperglycemia: Secondary | ICD-10-CM

## 2014-03-25 DIAGNOSIS — F329 Major depressive disorder, single episode, unspecified: Secondary | ICD-10-CM

## 2014-03-25 DIAGNOSIS — IMO0002 Reserved for concepts with insufficient information to code with codable children: Secondary | ICD-10-CM

## 2014-03-25 DIAGNOSIS — I480 Paroxysmal atrial fibrillation: Secondary | ICD-10-CM

## 2014-03-25 DIAGNOSIS — F32A Depression, unspecified: Secondary | ICD-10-CM

## 2014-03-25 MED ORDER — SERTRALINE HCL 50 MG PO TABS: 50.0000 mg | ORAL_TABLET | Freq: Every day | ORAL | Status: DC

## 2014-03-25 MED ORDER — INSULIN GLARGINE 100 UNIT/ML ~~LOC~~ SOLN: 50.0000 [IU] | Freq: Every day | SUBCUTANEOUS | Status: DC

## 2014-03-25 MED ORDER — TRAMADOL HCL 50 MG PO TABS: 100.0000 mg | ORAL_TABLET | Freq: Three times a day (TID) | ORAL | Status: DC | PRN

## 2014-03-25 MED ORDER — INSULIN ASPART 100 UNIT/ML ~~LOC~~ SOLN: SUBCUTANEOUS | Status: DC

## 2014-03-25 NOTE — Progress Notes (Signed)
Subjective:    Patient ID: [name] [name], female    DOB: March 03, 1935, 79 y.o.   MRN: [id number]  HPI 79YO female presents for follow up.  Recently seen by cardiology, started on Amiodarone for AFIB. Tolerating well.  DM - Compliant with medications. Sometimes wakes at night and has BG of 40-60.   Depression - more depressed mood recently. Would like to increase Sertraline. Upset about some issues going on with her children. Her son lives nearby but does not come to visit.  Review of Systems  Constitutional: Negative for fever, chills, appetite change, fatigue and unexpected weight change.  Eyes: Negative for visual disturbance.  Respiratory: Negative for shortness of breath.   Cardiovascular: Negative for chest pain and leg swelling.  Gastrointestinal: Negative for nausea, vomiting, abdominal pain, diarrhea and constipation.  Musculoskeletal: Positive for myalgias, back pain and arthralgias.  Skin: Negative for color change and rash.  Hematological: Negative for adenopathy. Does not bruise/bleed easily.  Psychiatric/Behavioral: Positive for dysphoric mood. Negative for sleep disturbance. The patient is not nervous/anxious.        Objective:    BP 122/60 mmHg  Pulse 69  Temp(Src) 97.9 F (36.6 C) (Oral)  Ht 5' 2.5" (1.588 m)  Wt 229 lb 4 oz (103.987 kg)  BMI 41.24 kg/m2  SpO2 93% Physical Exam  Constitutional: She is oriented to person, place, and time. She appears well-developed and well-nourished. No distress.  HENT:  Head: Normocephalic and atraumatic.  Right Ear: External ear normal.  Left Ear: External ear normal.  Nose: Nose normal.  Mouth/Throat: Oropharynx is clear and moist. No oropharyngeal exudate.  Eyes: Conjunctivae are normal. Pupils are equal, round, and reactive to light. Right eye exhibits no discharge. Left eye exhibits no discharge. No scleral icterus.  Neck: Normal range of motion. Neck supple. No tracheal deviation present. No thyromegaly present.    Cardiovascular: Normal rate, regular rhythm, normal heart sounds and intact distal pulses.   Extrasystoles are present. Exam reveals no gallop and no friction rub.   No murmur heard. Pulmonary/Chest: Effort normal and breath sounds normal. No accessory muscle usage. No tachypnea. No respiratory distress. She has no decreased breath sounds. She has no wheezes. She has no rhonchi. She has no rales. She exhibits no tenderness.  Musculoskeletal: Normal range of motion. She exhibits no edema or tenderness.  Lymphadenopathy:    She has no cervical adenopathy.  Neurological: She is alert and oriented to person, place, and time. No cranial nerve deficit. She exhibits normal muscle tone. Coordination normal.  Skin: Skin is warm and dry. No rash noted. She is not diaphoretic. No erythema. No pallor.  Psychiatric: Her speech is normal and behavior is normal. Judgment and thought content normal. Her mood appears anxious. Cognition and memory are normal. She exhibits a depressed mood. She expresses no homicidal and no suicidal ideation. She expresses no suicidal plans.          Assessment & Plan:   Problem List Items Addressed This Visit      Unprioritized   Atrial fibrillation    Reviewed recent notes from cardiology. She was just started on Amiodarone. Follow up with cardiology next week.    Depression    Worsening symptoms of depression. Will increase Sertraline to 50mg  daily. Follow up in 4 weeks and prn.    Relevant Medications      sertraline (ZOLOFT) tablet   Diabetes mellitus type 2, uncontrolled - Primary    Encouraged her to keep a better  record of her blood sugars, as she is having some lows, and I am reluctant to increase insulin, however A1c is 8.1%. Follow up 4 weeks.    Relevant Medications      insulin glargine (LANTUS) 100 UNIT/ML injection      insulin aspart (NOVOLOG) 100 UNIT/ML injection       Return in about 4 weeks (around 04/22/2014) for Recheck.

## 2014-03-25 NOTE — Assessment & Plan Note (Signed)
Encouraged her to keep a better record of her blood sugars, as she is having some lows, and I am reluctant to increase insulin, however A1c is 8.1%. Follow up 4 weeks.

## 2014-03-25 NOTE — Assessment & Plan Note (Signed)
Reviewed recent notes from cardiology. She was just started on Amiodarone. Follow up with cardiology next week.

## 2014-03-25 NOTE — Patient Instructions (Addendum)
Increase Sertraline to 50mg  daily.  Please record blood sugars 2-3 times daily and bring to next visit.  Call if any blood sugars less than 70.  Follow up in 4 weeks.

## 2014-03-25 NOTE — Assessment & Plan Note (Signed)
Worsening symptoms of depression. Will increase Sertraline to 50mg  daily. Follow up in 4 weeks and prn.

## 2014-03-25 NOTE — Progress Notes (Signed)
Pre visit review using our clinic review tool, if applicable. No additional management support is needed unless otherwise documented below in the visit note. 

## 2014-03-29 ENCOUNTER — Telehealth: Payer: Self-pay | Admitting: Internal Medicine

## 2014-03-29 NOTE — Telephone Encounter (Signed)
Spoke to patient, advised to call insurance and see what they will cover in place of Lantus.  verbalized understanding and to call back with another medication that would be covered.

## 2014-03-29 NOTE — Telephone Encounter (Signed)
Cindy w/[facility] said patient was prescribed Lantus and her copay is $100 for this medication and the patient cannot afford this.  An alternative has to be figured out. Thank you.

## 2014-03-29 NOTE — Telephone Encounter (Signed)
Will her insurance cover Levemir?

## 2014-03-30 ENCOUNTER — Encounter: Payer: Self-pay | Admitting: Cardiovascular Disease

## 2014-03-30 ENCOUNTER — Ambulatory Visit (INDEPENDENT_AMBULATORY_CARE_PROVIDER_SITE_OTHER): Payer: Commercial Managed Care - HMO | Admitting: Cardiovascular Disease

## 2014-03-30 VITALS — BP 130/66 | HR 76 | Ht 63.0 in | Wt 226.5 lb

## 2014-03-30 DIAGNOSIS — E1151 Type 2 diabetes mellitus with diabetic peripheral angiopathy without gangrene: Secondary | ICD-10-CM

## 2014-03-30 DIAGNOSIS — R0602 Shortness of breath: Secondary | ICD-10-CM

## 2014-03-30 DIAGNOSIS — E1165 Type 2 diabetes mellitus with hyperglycemia: Secondary | ICD-10-CM

## 2014-03-30 DIAGNOSIS — I25119 Atherosclerotic heart disease of native coronary artery with unspecified angina pectoris: Secondary | ICD-10-CM

## 2014-03-30 DIAGNOSIS — G5620 Lesion of ulnar nerve, unspecified upper limb: Secondary | ICD-10-CM | POA: Insufficient documentation

## 2014-03-30 DIAGNOSIS — E1159 Type 2 diabetes mellitus with other circulatory complications: Secondary | ICD-10-CM

## 2014-03-30 DIAGNOSIS — I6523 Occlusion and stenosis of bilateral carotid arteries: Secondary | ICD-10-CM

## 2014-03-30 DIAGNOSIS — G5622 Lesion of ulnar nerve, left upper limb: Secondary | ICD-10-CM

## 2014-03-30 DIAGNOSIS — I158 Other secondary hypertension: Secondary | ICD-10-CM

## 2014-03-30 DIAGNOSIS — I480 Paroxysmal atrial fibrillation: Secondary | ICD-10-CM

## 2014-03-30 DIAGNOSIS — I63239 Cerebral infarction due to unspecified occlusion or stenosis of unspecified carotid arteries: Secondary | ICD-10-CM

## 2014-03-30 DIAGNOSIS — I4891 Unspecified atrial fibrillation: Secondary | ICD-10-CM

## 2014-03-30 NOTE — Assessment & Plan Note (Signed)
Recommend that she stay on her anticoagulation

## 2014-03-30 NOTE — Assessment & Plan Note (Signed)
Currently with no symptoms of angina. No further workup at this time. Continue current medication regimen. 

## 2014-03-30 NOTE — Assessment & Plan Note (Signed)
Blood pressure is well controlled on today's visit. No changes made to the medications. 

## 2014-03-30 NOTE — Patient Instructions (Addendum)
You are doing well. No medication changes were made.  Please let us know which of these dates is best to schedule a cardioversion:  11/17, 11/23, 11/24, 11/25, 12/1 or 12/4  Please call us if you have new issues that need to be addressed before your next appt.  Your physician wants you to follow-up in: 6 months.  You will receive a reminder letter in the mail two months in advance. If you don't receive a letter, please call our office to schedule the follow-up appointment.  Your next appointment will be scheduled in our new office located at :  [facility]  [address], [address]  [place], [place] [address]

## 2014-03-30 NOTE — Progress Notes (Signed)
Patient ID: [name] [name], female    DOB: 1935/05/19, 79 y.o.   MRN: [id number]  HPI Comments: [name] [name] is a 79 year old woman with a history of CAD, status post PTCA/cutting balloon angioplasty for in-stent restenosis of the RCA,  Who presents for routine followup. history of total right knee replacement,  also history of spinal stenosis.  Found to be in atrial fibrillation starting between September 14 and February 06 2014.  in the hospital 02/06/2014 with dizziness, general weakness, nausea, blood sugars 307 CT scan of the head showed old stroke, incidental meningioma EKG showed atrial fibrillation  She presents today for follow-up of her atrial fibrillation In general she feels well. She was started on amiodarone on her prior clinic visit. EKG today shows atrial fibrillation with rate 76 bpm, nonspecific ST abnormality She denies lower extremity edema, she does have fatigue and mild shortness of breath Friend accompanies her today for support. Long discussion about atrial fibrillation and various treatment options  She continues to have somesevere hip pain, walking with a cane.  She denies any chest pain, anginal-type symptoms, no lightheadedness or dizziness   Other past medical history admitted to the hospital at the end of May 17 2013 with discharge 05/18/2013 with diagnosis of chest pain. Cardiac enzymes were negative. She had stress test that showed no ischemia.   Repeat hospital admission 06/12/2011 with discharge 06/12/2013. Admitted with chest pain. She had cardiac catheterization that showed 60% mid RCA disease, FFR performed with the results of 0.86, no intervention done. Also with 75% ramus disease, 90% small OM disease. Medical management was recommended  Prior lab work; Total cholesterol 170, LDL 96.    episode of chest pain in 2012, admitted to the hospital, workup including cardiac enzymes and a stress test. Stress test showed no ischemia and she was  discharged home She has significant orthopedic problems. She had a fall in the recent past, broke her left shoulder. Scheduled for surgery on September 18.  Previous Hemoglobin A1c 8.3. After cortisone shot     Outpatient Encounter Prescriptions as of 03/30/2014  Medication Sig  . albuterol (PROVENTIL HFA;VENTOLIN HFA) 108 (90 BASE) MCG/ACT inhaler Inhale 2 puffs into the lungs every 6 (six) hours as needed for wheezing or shortness of breath.  Marland Kitchen amiodarone (PACERONE) 200 MG tablet Take 1 tablet (200 mg total) by mouth 2 (two) times daily.  Marland Kitchen apixaban (ELIQUIS) 5 MG TABS tablet Take 1 tablet (5 mg total) by mouth 2 (two) times daily.  Marland Kitchen atorvastatin (LIPITOR) 20 MG tablet Take 1 tablet (20 mg total) by mouth daily.  . cyclobenzaprine (FLEXERIL) 5 MG tablet Take 1 tablet (5 mg total) by mouth 3 (three) times daily as needed.  . diltiazem (CARDIZEM CD) 120 MG 24 hr capsule Take 1 capsule (120 mg total) by mouth daily.  . ferrous sulfate 325 (65 FE) MG tablet Take 325 mg by mouth daily with breakfast.  . fluticasone (FLONASE) 50 MCG/ACT nasal spray Place 2 sprays into the nose daily.  . furosemide (LASIX) 40 MG tablet Take 1 tablet (40 mg total) by mouth 2 (two) times daily.  Marland Kitchen gabapentin (NEURONTIN) 300 MG capsule Take 1 capsule (300 mg total) by mouth 3 (three) times daily.  . insulin aspart (NOVOLOG) 100 UNIT/ML injection Before each meal 3 times a day, 140-199 - 2 units, 200-250 - 4 units, 251-299 - 6 units,  300-349 - 8 units,  350 or above 10 units. Insulin PEN if approved, provide syringes and  needles if needed.  . insulin glargine (LANTUS) 100 UNIT/ML injection Inject 0.5 mLs (50 Units total) into the skin at bedtime.  Marland Kitchen latanoprost (XALATAN) 0.005 % ophthalmic solution Place 1 drop into both eyes at bedtime.   Marland Kitchen levothyroxine (LEVOTHROID) 150 MCG tablet Take 1 tablet (150 mcg total) by mouth daily.  Marland Kitchen lisinopril (PRINIVIL,ZESTRIL) 10 MG tablet Take 1 tablet (10 mg total) by mouth daily.   . meclizine (ANTIVERT) 25 MG tablet Take 1 tablet (25 mg total) by mouth 2 (two) times daily. As needed for dizziness or nausea  . metFORMIN (GLUCOPHAGE) 500 MG tablet Take 1 tablet (500 mg total) by mouth 2 (two) times daily.  . metoprolol tartrate (LOPRESSOR) 25 MG tablet Take 1 tablet (25 mg total) by mouth 2 (two) times daily as needed.  . nitroGLYCERIN (NITROSTAT) 0.4 MG SL tablet Place 1 tablet (0.4 mg total) under the tongue every 5 (five) minutes as needed.  Marland Kitchen omeprazole (PRILOSEC) 20 MG capsule Take 1 capsule (20 mg total) by mouth 2 (two) times daily.  . potassium chloride SA (K-DUR,KLOR-CON) 20 MEQ tablet Take 1 tablet (20 mEq total) by mouth 2 (two) times daily.  . sertraline (ZOLOFT) 50 MG tablet Take 1 tablet (50 mg total) by mouth daily.  . traMADol (ULTRAM) 50 MG tablet Take 2 tablets (100 mg total) by mouth every 8 (eight) hours as needed for moderate pain.  social history  reports that she has never smoked. She has never used smokeless tobacco. She reports that she does not drink alcohol or use illicit drugs.  Review of Systems  HENT: Negative.   Eyes: Negative.   Respiratory: Positive for shortness of breath.   Cardiovascular: Negative.   Gastrointestinal: Negative.   Endocrine: Negative.   Musculoskeletal: Positive for back pain, joint swelling, arthralgias and gait problem.  Skin: Negative.   Allergic/Immunologic: Negative.   Neurological: Positive for weakness.  Hematological: Negative.   Psychiatric/Behavioral: Negative.   All other systems reviewed and are negative.   BP 130/66 mmHg  Pulse 76  Ht 5\' 3"  (1.6 m)  Wt 226 lb 8 oz (102.74 kg)  BMI 40.13 kg/m2  Physical Exam  Constitutional: She is oriented to person, place, and time. She appears well-developed and well-nourished.  obese  HENT:  Head: Normocephalic.  Nose: Nose normal.  Mouth/Throat: Oropharynx is clear and moist.  Eyes: Conjunctivae are normal. Pupils are equal, round, and reactive to  light.  Neck: Normal range of motion. Neck supple. No JVD present.  Cardiovascular: Normal rate, regular rhythm, S1 normal, S2 normal, normal heart sounds and intact distal pulses.  An irregularly irregular rhythm present. Exam reveals no gallop and no friction rub.   No murmur heard. Pulmonary/Chest: Effort normal and breath sounds normal. No respiratory distress. She has no wheezes. She has no rales. She exhibits no tenderness.  Abdominal: Soft. Bowel sounds are normal. She exhibits no distension. There is no tenderness.  Musculoskeletal: Normal range of motion. She exhibits no edema or tenderness.  Lymphadenopathy:    She has no cervical adenopathy.  Neurological: She is alert and oriented to person, place, and time. Coordination normal.  Skin: Skin is warm and dry. No rash noted. No erythema.  Psychiatric: She has a normal mood and affect. Her behavior is normal. Judgment and thought content normal.    Assessment and Plan  Nursing note and vitals reviewed.

## 2014-03-30 NOTE — Assessment & Plan Note (Signed)
Stressed the importance of diet changes, regular exercise

## 2014-03-30 NOTE — Assessment & Plan Note (Signed)
Long discussion today concerning her atrial fibrillation and various treatment options. She is willing to have a cardioversion. Risk and benefit was discussed with her. She will stay on her same medications, cardioversion will be scheduled for next week

## 2014-03-30 NOTE — Assessment & Plan Note (Signed)
Right: 40-59% ICA stenosis. Left: 1-39% ICA  Bilateral: Vertebral artery flow is antegrade. Mild to moderate ECA stenosis.

## 2014-03-30 NOTE — Assessment & Plan Note (Signed)
She reports having numbness, tingling of her fourth and fifth finger on the left hand, mild extension of her forearm in the ulnar distribution. She reports symptoms have been getting worse. Recommended she talk with Dr. Dan Humphreys. She might need a x-ray of her elbow to rule out nerve compression

## 2014-03-31 ENCOUNTER — Telehealth: Payer: Self-pay | Admitting: Cardiovascular Disease

## 2014-03-31 ENCOUNTER — Other Ambulatory Visit: Payer: Self-pay

## 2014-03-31 DIAGNOSIS — I480 Paroxysmal atrial fibrillation: Secondary | ICD-10-CM

## 2014-03-31 LAB — CBC WITH DIFFERENTIAL
BASOS: 0 %
Basophils Absolute: 0 10*3/uL (ref 0.0–0.2)
EOS: 2 %
Eosinophils Absolute: 0.2 10*3/uL (ref 0.0–0.4)
HCT: 36.7 % (ref 34.0–46.6)
HEMOGLOBIN: 11.8 g/dL (ref 11.1–15.9)
Immature Grans (Abs): 0 10*3/uL (ref 0.0–0.1)
Immature Granulocytes: 0 %
LYMPHS ABS: 1.9 10*3/uL (ref 0.7–3.1)
Lymphs: 24 %
MCH: 27.8 pg (ref 26.6–33.0)
MCHC: 32.2 g/dL (ref 31.5–35.7)
MCV: 87 fL (ref 79–97)
MONOS ABS: 0.6 10*3/uL (ref 0.1–0.9)
Monocytes: 8 %
Neutrophils Absolute: 5.5 10*3/uL (ref 1.4–7.0)
Neutrophils Relative %: 66 %
Platelets: 327 10*3/uL (ref 150–379)
RBC: 4.24 x10E6/uL (ref 3.77–5.28)
RDW: 14.5 % (ref 12.3–15.4)
WBC: 8.2 10*3/uL (ref 3.4–10.8)

## 2014-03-31 LAB — PROTIME-INR
INR: 1.1 (ref 0.8–1.2)
Prothrombin Time: 11.5 s (ref 9.1–12.0)

## 2014-03-31 LAB — BASIC METABOLIC PANEL
BUN / CREAT RATIO: 21 (ref 11–26)
BUN: 22 mg/dL (ref 8–27)
CO2: 19 mmol/L (ref 18–29)
Calcium: 9.7 mg/dL (ref 8.7–10.3)
Chloride: 101 mmol/L (ref 97–108)
Creatinine, Ser: 1.07 mg/dL — ABNORMAL HIGH (ref 0.57–1.00)
GFR calc Af Amer: 58 mL/min/{1.73_m2} — ABNORMAL LOW (ref >59)
GFR calc non Af Amer: 51 mL/min/{1.73_m2} — ABNORMAL LOW (ref >59)
Glucose: 149 mg/dL — ABNORMAL HIGH (ref 65–99)
Potassium: 5 mmol/L (ref 3.5–5.2)
SODIUM: 141 mmol/L (ref 134–144)

## 2014-03-31 NOTE — Telephone Encounter (Signed)
Spoke w/ pt.  Advised her that we are moving offices and that I will need to speak w/ Dr. Mariah Milling and check his schedule book in order to set this up.  Advised her that I will call her on Monday to proceed.  She verbalizes understanding.

## 2014-03-31 NOTE — Telephone Encounter (Signed)
New message       Pt calling stating she needs a call back in regards to scheduling her cardio version. Please call back and advise.

## 2014-04-06 ENCOUNTER — Ambulatory Visit: Payer: Commercial Managed Care - HMO | Admitting: Neurology

## 2014-04-06 ENCOUNTER — Telehealth: Payer: Self-pay | Admitting: Cardiovascular Disease

## 2014-04-06 NOTE — Telephone Encounter (Signed)
Spoke w/ pt.  She would like to proceed w/ scheduling cardioversion for 04/11/14. Advised pt to arrive at 6:30am that morning. Asked her to call back w/ any questions or concerns.

## 2014-04-06 NOTE — Telephone Encounter (Signed)
Pt is calling to schedule cath, She stated it was suppose to be done yesterday but she forgot to call us. Please call pt, she was confused.

## 2014-04-11 ENCOUNTER — Telehealth: Payer: Self-pay | Admitting: *Deleted

## 2014-04-11 ENCOUNTER — Ambulatory Visit: Payer: Self-pay | Admitting: Cardiovascular Disease

## 2014-04-11 DIAGNOSIS — I4891 Unspecified atrial fibrillation: Secondary | ICD-10-CM

## 2014-04-11 NOTE — Telephone Encounter (Signed)
[name] [name] at [facility], left a message asking with the holiday this week would it be okay to move one of her speech therapy visits to next week.

## 2014-04-11 NOTE — Telephone Encounter (Signed)
That is fine 

## 2014-04-12 NOTE — Telephone Encounter (Signed)
Notified home health nurse

## 2014-04-18 ENCOUNTER — Telehealth: Payer: Self-pay

## 2014-04-18 NOTE — Telephone Encounter (Signed)
The patient called and stated the insulin that was called in for her is over $100.00 and she is unable to pay that.  She is hoping a cheaper medication can be called in for her.  Thanks!

## 2014-04-18 NOTE — Telephone Encounter (Signed)
Pharmacy is contacting insurance company to see what comparable medication that would be cheaper and will fax a list to [place] today

## 2014-04-21 NOTE — Telephone Encounter (Signed)
Pharmacy states that pt in now in the "donut hole" and that's why Novalog is so expensive.

## 2014-04-26 ENCOUNTER — Ambulatory Visit (INDEPENDENT_AMBULATORY_CARE_PROVIDER_SITE_OTHER): Payer: Commercial Managed Care - HMO | Admitting: Cardiovascular Disease

## 2014-04-26 ENCOUNTER — Encounter: Payer: Self-pay | Admitting: Cardiovascular Disease

## 2014-04-26 VITALS — BP 122/58 | HR 50 | Ht 63.0 in | Wt 226.2 lb

## 2014-04-26 DIAGNOSIS — M1712 Unilateral primary osteoarthritis, left knee: Secondary | ICD-10-CM

## 2014-04-26 DIAGNOSIS — E785 Hyperlipidemia, unspecified: Secondary | ICD-10-CM

## 2014-04-26 DIAGNOSIS — R0602 Shortness of breath: Secondary | ICD-10-CM

## 2014-04-26 DIAGNOSIS — I480 Paroxysmal atrial fibrillation: Secondary | ICD-10-CM

## 2014-04-26 DIAGNOSIS — E1151 Type 2 diabetes mellitus with diabetic peripheral angiopathy without gangrene: Secondary | ICD-10-CM

## 2014-04-26 DIAGNOSIS — I158 Other secondary hypertension: Secondary | ICD-10-CM

## 2014-04-26 DIAGNOSIS — E1159 Type 2 diabetes mellitus with other circulatory complications: Secondary | ICD-10-CM

## 2014-04-26 DIAGNOSIS — E1165 Type 2 diabetes mellitus with hyperglycemia: Secondary | ICD-10-CM

## 2014-04-26 DIAGNOSIS — I25119 Atherosclerotic heart disease of native coronary artery with unspecified angina pectoris: Secondary | ICD-10-CM

## 2014-04-26 NOTE — Assessment & Plan Note (Signed)
We have encouraged continued exercise, careful diet management in an effort to lose weight. 

## 2014-04-26 NOTE — Assessment & Plan Note (Signed)
Cholesterol is at goal on the current lipid regimen. No changes to the medications were made.  

## 2014-04-26 NOTE — Assessment & Plan Note (Signed)
She is maintaining normal sinus rhythm after recent cardioversion 2 weeks ago. We will decrease the amiodarone down to 200 mg daily, continue her other medications. She does not take metoprolol. Will continue anticoagulation for now. Samples provided

## 2014-04-26 NOTE — Assessment & Plan Note (Signed)
Currently with no symptoms of angina. No further workup at this time. Continue current medication regimen. 

## 2014-04-26 NOTE — Assessment & Plan Note (Signed)
We have encouraged continued exercise, careful diet management in an effort to lose weight. Her exercise is limited by her chronic left knee pain

## 2014-04-26 NOTE — Progress Notes (Signed)
Patient ID: [name] [name], female    DOB: October 28, 1934, 79 y.o.   MRN: [id number]  HPI Comments: [name] [name] is a 79 year old woman with a history of CAD, status post PTCA/cutting balloon angioplasty for in-stent restenosis of the RCA,  Who presents for routine followup of her atrial fibrillation history of total right knee replacement,  also history of spinal stenosis.  Found to be in atrial fibrillation starting between September 14 and February 06 2014.  in the hospital 02/06/2014 with dizziness, general weakness, nausea, blood sugars 307 CT scan of the head showed old stroke, incidental meningioma EKG showed atrial fibrillation  In follow-up today, she is doing well. She feels that she needs a total knee replacement but feels that she is too old. She is tolerating her medications. She had cardioversion 04/11/2014 which was successful. She reports having some fatigue in the daytime otherwise feels well. Is not very active secondary to chronic left osteoarthritis knee pain. She feels that she needs a total knee replacement but feels that she is too old. She is tolerating her medications. Requesting samples of her eliquis. Denies having any lower extremity edema. She takes Lasix 40 mg twice a day with potassium once a day She denies any chest pain, anginal-type symptoms, no lightheadedness or dizziness   EKG on today's visit shows normal sinus rhythm with rate 50 bpm, no significant ST or T-wave changes  Other past medical history admitted to the hospital at the end of May 17 2013 with discharge 05/18/2013 with diagnosis of chest pain. Cardiac enzymes were negative. She had stress test that showed no ischemia.   Repeat hospital admission 06/12/2011 with discharge 06/12/2013. Admitted with chest pain. She had cardiac catheterization that showed 60% mid RCA disease, FFR performed with the results of 0.86, no intervention done. Also with 75% ramus disease, 90% small OM disease. Medical management was recommended  Prior lab work; Total cholesterol 170, LDL 96.    episode of chest pain in 2012,  admitted to the hospital, workup including cardiac enzymes and a stress test. Stress test showed no ischemia and she was discharged home She has significant orthopedic problems. She had a fall in the recent past, broke her left shoulder. Scheduled for surgery on September 18.  Previous Hemoglobin A1c 8.3. After cortisone shot     Outpatient Encounter Prescriptions as of 04/26/2014  Medication Sig  . albuterol (PROVENTIL HFA;VENTOLIN HFA) 108 (90 BASE) MCG/ACT inhaler Inhale 2 puffs into the lungs every 6 (six) hours as needed for wheezing or shortness of breath.  Marland Kitchen amiodarone (PACERONE) 200 MG tablet Take 1 tablet (200 mg total) by mouth 2 (two) times daily.  Marland Kitchen apixaban (ELIQUIS) 5 MG TABS tablet Take 1 tablet (5 mg total) by mouth 2 (two) times daily.  Marland Kitchen atorvastatin (LIPITOR) 20 MG tablet Take 1 tablet (20 mg total) by mouth daily.  . cyclobenzaprine (FLEXERIL) 5 MG tablet Take 1 tablet (5 mg total) by mouth 3 (three) times daily as needed.  . diltiazem (CARDIZEM CD) 120 MG 24 hr capsule Take 1 capsule (120 mg total) by mouth daily.  . ferrous sulfate 325 (65 FE) MG tablet Take 325 mg by mouth daily with breakfast.  . fluticasone (FLONASE) 50 MCG/ACT nasal spray Place 2 sprays into the nose daily.  . furosemide (LASIX) 40 MG tablet Take 1 tablet (40 mg total) by mouth 2 (two) times daily.  Marland Kitchen gabapentin (NEURONTIN) 300 MG capsule Take 1 capsule (300 mg total) by mouth 3 (three) times daily.  . insulin aspart (NOVOLOG) 100 UNIT/ML injection Before each meal 3 times a day, 140-199 - 2 units,  200-250 - 4 units, 251-299 - 6 units,  300-349 - 8 units,  350 or above 10 units. Insulin PEN if approved, provide syringes and needles if needed.  . insulin glargine (LANTUS) 100 UNIT/ML injection Inject 0.5 mLs (50 Units total) into the skin at bedtime.  Marland Kitchen latanoprost (XALATAN) 0.005 % ophthalmic solution Place 1 drop into both eyes at bedtime.   Marland Kitchen levothyroxine (LEVOTHROID) 150 MCG tablet Take 1 tablet  (150 mcg total) by mouth daily.  Marland Kitchen lisinopril (PRINIVIL,ZESTRIL) 10 MG tablet Take 1 tablet (10 mg total) by mouth daily.  . meclizine (ANTIVERT) 25 MG tablet Take 1 tablet (25 mg total) by mouth 2 (two) times daily. As needed for dizziness or nausea  . metFORMIN (GLUCOPHAGE) 500 MG tablet Take 1 tablet (500 mg total) by mouth 2 (two) times daily.  . metoprolol tartrate (LOPRESSOR) 25 MG tablet Take 1 tablet (25 mg total) by mouth 2 (two) times daily as needed.  . nitroGLYCERIN (NITROSTAT) 0.4 MG SL tablet Place 1 tablet (0.4 mg total) under the tongue every 5 (five) minutes as needed.  Marland Kitchen omeprazole (PRILOSEC) 20 MG capsule Take 1 capsule (20 mg total) by mouth 2 (two) times daily.  . potassium chloride SA (K-DUR,KLOR-CON) 20 MEQ tablet Take 1 tablet (20 mEq total) by mouth 2 (two) times daily.  . sertraline (ZOLOFT) 50 MG tablet Take 1 tablet (50 mg total) by mouth daily.  . traMADol (ULTRAM) 50 MG tablet Take 2 tablets (100 mg total) by mouth every 8 (eight) hours as needed for moderate pain.  social history  reports that she has never smoked. She has never used smokeless tobacco. She reports that she does not drink alcohol or use illicit drugs.  Review of Systems  Respiratory: Negative.   Cardiovascular: Negative.   Gastrointestinal: Negative.   Musculoskeletal: Positive for back pain, joint swelling, arthralgias and gait problem.  Neurological: Negative.   Hematological: Negative.   Psychiatric/Behavioral: Negative.   All other systems reviewed and are negative.   BP 122/58 mmHg  Pulse 50  Ht 5\' 3"  (1.6 m)  Wt 226 lb 4 oz (102.626 kg)  BMI 40.09 kg/m2  Physical Exam  Constitutional: She is oriented to person, place, and time. She appears well-developed and well-nourished.  obese  HENT:  Head: Normocephalic.  Nose: Nose normal.  Mouth/Throat: Oropharynx is clear and moist.  Eyes: Conjunctivae are normal. Pupils are equal, round, and reactive to light.  Neck: Normal range of  motion. Neck supple. No JVD present.  Cardiovascular: Normal rate, S1 normal, S2 normal, normal heart sounds and intact distal pulses.  An irregularly irregular rhythm present. Exam reveals no gallop and no friction rub.   No murmur heard. Pulmonary/Chest: Effort normal and breath sounds normal. No respiratory distress. She has no wheezes. She has no rales. She exhibits no tenderness.  Abdominal: Soft. Bowel sounds are normal. She exhibits no distension. There is no tenderness.  Musculoskeletal: Normal range of motion. She exhibits no edema or tenderness.  Lymphadenopathy:    She has no cervical adenopathy.  Neurological: She is alert and oriented to person, place, and time. Coordination normal.  Skin: Skin is warm and dry. No rash noted. No erythema.  Psychiatric: She has a normal mood and affect. Her behavior is normal. Judgment and thought content normal.    Assessment and Plan  Nursing note and vitals reviewed.

## 2014-04-26 NOTE — Patient Instructions (Addendum)
You are doing well.  Please decrease the amiodarone down to one a day  Please call us if you have new issues that need to be addressed before your next appt.  Your physician wants you to follow-up in: 6 months.  You will receive a reminder letter in the mail two months in advance. If you don't receive a letter, please call our office to schedule the follow-up appointment.

## 2014-04-26 NOTE — Assessment & Plan Note (Signed)
She had been acceptable risk for total knee replacement on the left if needed. She is very hesitant, reports that she is too old. Tried to encourage her to be evaluated as she has severe pain

## 2014-05-11 ENCOUNTER — Ambulatory Visit: Payer: Commercial Managed Care - HMO | Admitting: Internal Medicine

## 2014-05-16 ENCOUNTER — Inpatient Hospital Stay: Payer: Self-pay | Admitting: Internal Medicine

## 2014-05-16 ENCOUNTER — Encounter: Payer: Self-pay | Admitting: Internal Medicine

## 2014-05-16 ENCOUNTER — Ambulatory Visit (INDEPENDENT_AMBULATORY_CARE_PROVIDER_SITE_OTHER): Payer: Commercial Managed Care - HMO | Admitting: Internal Medicine

## 2014-05-16 VITALS — BP 146/75 | HR 58 | Temp 98.0°F | Ht 63.0 in | Wt 231.5 lb

## 2014-05-16 DIAGNOSIS — G8929 Other chronic pain: Secondary | ICD-10-CM

## 2014-05-16 DIAGNOSIS — E1159 Type 2 diabetes mellitus with other circulatory complications: Secondary | ICD-10-CM

## 2014-05-16 DIAGNOSIS — I158 Other secondary hypertension: Secondary | ICD-10-CM

## 2014-05-16 DIAGNOSIS — M549 Dorsalgia, unspecified: Secondary | ICD-10-CM

## 2014-05-16 DIAGNOSIS — K921 Melena: Secondary | ICD-10-CM

## 2014-05-16 DIAGNOSIS — E1151 Type 2 diabetes mellitus with diabetic peripheral angiopathy without gangrene: Secondary | ICD-10-CM

## 2014-05-16 DIAGNOSIS — I480 Paroxysmal atrial fibrillation: Secondary | ICD-10-CM

## 2014-05-16 DIAGNOSIS — E1165 Type 2 diabetes mellitus with hyperglycemia: Secondary | ICD-10-CM

## 2014-05-16 LAB — URINALYSIS, COMPLETE
BILIRUBIN, UR: NEGATIVE
BLOOD: NEGATIVE
GLUCOSE, UR: NEGATIVE mg/dL (ref 0–75)
Hyaline Cast: 3
Ketone: NEGATIVE
Leukocyte Esterase: NEGATIVE
Nitrite: NEGATIVE
PROTEIN: NEGATIVE
Ph: 5 (ref 4.5–8.0)
RBC,UR: <1 /HPF (ref 0–5)
Specific Gravity: 1.006 (ref 1.003–1.030)
Squamous Epithelial: <1

## 2014-05-16 LAB — CBC
HCT: 34 % — ABNORMAL LOW (ref 35.0–47.0)
HGB: 10.9 g/dL — ABNORMAL LOW (ref 12.0–16.0)
MCH: 28.3 pg (ref 26.0–34.0)
MCHC: 32 g/dL (ref 32.0–36.0)
MCV: 88 fL (ref 80–100)
Platelet: 249 10*3/uL (ref 150–440)
RBC: 3.85 10*6/uL (ref 3.80–5.20)
RDW: 15.6 % — AB (ref 11.5–14.5)
WBC: 7.3 10*3/uL (ref 3.6–11.0)

## 2014-05-16 LAB — COMPREHENSIVE METABOLIC PANEL
ALBUMIN: 3.1 g/dL — AB (ref 3.4–5.0)
ALT: 18 U/L (ref 0–35)
AST: 22 U/L (ref 0–37)
Albumin: 3.6 g/dL (ref 3.5–5.2)
Alkaline Phosphatase: 86 U/L (ref 39–117)
Alkaline Phosphatase: 102 U/L
Anion Gap: 7 (ref 7–16)
BUN: 20 mg/dL (ref 6–23)
BUN: 19 mg/dL — ABNORMAL HIGH (ref 7–18)
Bilirubin,Total: 0.5 mg/dL (ref 0.2–1.0)
CO2: 28 meq/L (ref 19–32)
Calcium, Total: 8.8 mg/dL (ref 8.5–10.1)
Calcium: 9.3 mg/dL (ref 8.4–10.5)
Chloride: 104 mEq/L (ref 96–112)
Chloride: 105 mmol/L (ref 98–107)
Co2: 29 mmol/L (ref 21–32)
Creatinine, Ser: 1.1 mg/dL (ref 0.4–1.2)
Creatinine: 1.29 mg/dL (ref 0.60–1.30)
EGFR (African American): 52 — ABNORMAL LOW
GFR CALC NON AF AMER: 43 — AB
GFR: 50.65 mL/min — AB (ref >60.00)
Glucose, Bld: 260 mg/dL — ABNORMAL HIGH (ref 70–99)
Glucose: 235 mg/dL — ABNORMAL HIGH (ref 65–99)
Osmolality: 291 (ref 275–301)
POTASSIUM: 4.3 mmol/L (ref 3.5–5.1)
Potassium: 5 mEq/L (ref 3.5–5.1)
SGOT(AST): 19 U/L (ref 15–37)
SGPT (ALT): 23 U/L
SODIUM: 139 meq/L (ref 135–145)
Sodium: 141 mmol/L (ref 136–145)
Total Bilirubin: 0.5 mg/dL (ref 0.2–1.2)
Total Protein: 7.2 g/dL (ref 6.0–8.3)
Total Protein: 7 g/dL (ref 6.4–8.2)

## 2014-05-16 LAB — TROPONIN I

## 2014-05-16 LAB — CBC WITH DIFFERENTIAL/PLATELET
BASOS PCT: 0.5 % (ref 0.0–3.0)
Basophils Absolute: 0 10*3/uL (ref 0.0–0.1)
Eosinophils Absolute: 0.2 10*3/uL (ref 0.0–0.7)
Eosinophils Relative: 3.1 % (ref 0.0–5.0)
HCT: 34.5 % — ABNORMAL LOW (ref 36.0–46.0)
HEMOGLOBIN: 11.2 g/dL — AB (ref 12.0–15.0)
LYMPHS ABS: 1.6 10*3/uL (ref 0.7–4.0)
Lymphocytes Relative: 22.6 % (ref 12.0–46.0)
MCHC: 32.5 g/dL (ref 30.0–36.0)
MCV: 86.7 fl (ref 78.0–100.0)
MONOS PCT: 7.6 % (ref 3.0–12.0)
Monocytes Absolute: 0.5 10*3/uL (ref 0.1–1.0)
NEUTROS ABS: 4.6 10*3/uL (ref 1.4–7.7)
Neutrophils Relative %: 66.2 % (ref 43.0–77.0)
Platelets: 285 10*3/uL (ref 150.0–400.0)
RBC: 3.98 Mil/uL (ref 3.87–5.11)
RDW: 16 % — ABNORMAL HIGH (ref 11.5–15.5)
WBC: 7 10*3/uL (ref 4.0–10.5)

## 2014-05-16 LAB — MICROALBUMIN / CREATININE URINE RATIO
Creatinine,U: 34.3 mg/dL
MICROALB/CREAT RATIO: 16 mg/g (ref 0.0–30.0)
Microalb, Ur: 5.5 mg/dL — ABNORMAL HIGH (ref 0.0–1.9)

## 2014-05-16 LAB — LIPID PANEL
CHOL/HDL RATIO: 4
Cholesterol: 172 mg/dL (ref 0–200)
HDL: 45.2 mg/dL (ref >39.00)
LDL Cholesterol: 89 mg/dL (ref 0–99)
NonHDL: 126.8
TRIGLYCERIDES: 187 mg/dL — AB (ref 0.0–149.0)
VLDL: 37.4 mg/dL (ref 0.0–40.0)

## 2014-05-16 LAB — HEMOGLOBIN A1C: Hgb A1c MFr Bld: 8.1 % — ABNORMAL HIGH (ref 4.6–6.5)

## 2014-05-16 LAB — LIPASE, BLOOD: LIPASE: 101 U/L (ref 73–393)

## 2014-05-16 MED ORDER — CYCLOBENZAPRINE HCL 5 MG PO TABS: 5.0000 mg | ORAL_TABLET | Freq: Three times a day (TID) | ORAL | Status: DC | PRN

## 2014-05-16 NOTE — Patient Instructions (Addendum)
Hold Eliquis for now until we determine cause of GI bleeding.  Labs today.  ER evaluation with GI today.  Follow up in 2 weeks.

## 2014-05-16 NOTE — Assessment & Plan Note (Signed)
Will check A1c with labs today. Continue Lantus. Referral to pt assistance for help with medication.

## 2014-05-16 NOTE — Assessment & Plan Note (Signed)
Recent hematochezia in setting of use of Eliquis. Will have her hold Eliquis.. Will check CBC today. GI Evaluation. We discussed risk of life threatening hemorrhage on Eliquis. ED evaluation if any recurrent bleeding.

## 2014-05-16 NOTE — Assessment & Plan Note (Signed)
NSR today on exam. Continue Amiodarone. Hold Eliquis given recent GI bleeding.

## 2014-05-16 NOTE — Progress Notes (Signed)
Pre visit review using our clinic review tool, if applicable. No additional management support is needed unless otherwise documented below in the visit note. 

## 2014-05-16 NOTE — Progress Notes (Signed)
Subjective:    Patient ID: [name] [name], female    DOB: 11/09/1934, 79 y.o.   MRN: [id number]  HPI 79YO female presents for follow up.  DM - Blood sugars have been low. 58 last night. No symptoms with this. Having trouble affording Lantus, $100 per month.  AFIB - Seen by Dr. Mariah Milling 12/8. Had cardioversion 11/23. Amiodarone was decreased to 200mg  daily.  Had heavy bleeding from rectum on Friday. Enough blood to fill toilet multiple times. Not painful. Not associated with BM. Did not seek care for this.  This occurred all day Friday and Saturday, then stopped. Feeling lightheaded at times, but this has been chronic for her. No fever, chills, abdominal pain. No diarrhea. Last BM yesterday, normal, non-bloody. She continued the Eliquis.  Past medical, surgical, family and social history per today's encounter.  Review of Systems  Constitutional: Negative for fever, chills, appetite change, fatigue and unexpected weight change.  Eyes: Negative for visual disturbance.  Respiratory: Negative for shortness of breath.   Cardiovascular: Negative for chest pain and leg swelling.  Gastrointestinal: Positive for blood in stool and anal bleeding. Negative for nausea, vomiting, abdominal pain, diarrhea, constipation and rectal pain.  Musculoskeletal: Positive for arthralgias.  Skin: Negative for color change and rash.  Hematological: Negative for adenopathy. Does not bruise/bleed easily.  Psychiatric/Behavioral: Negative for dysphoric mood. The patient is not nervous/anxious.        Objective:    BP 146/75 mmHg  Pulse 58  Temp(Src) 98 F (36.7 C) (Oral)  Ht 5\' 3"  (1.6 m)  Wt 231 lb 8 oz (105.008 kg)  BMI 41.02 kg/m2  SpO2 95% Physical Exam  Constitutional: She is oriented to person, place, and time. She appears well-developed and well-nourished. No distress.  HENT:  Head: Normocephalic and atraumatic.  Right Ear: External ear normal.  Left Ear: External ear normal.  Nose: Nose  normal.  Mouth/Throat: Oropharynx is clear and moist. No oropharyngeal exudate.  Eyes: Conjunctivae are normal. Pupils are equal, round, and reactive to light. Right eye exhibits no discharge. Left eye exhibits no discharge. No scleral icterus.  Neck: Normal range of motion. Neck supple. No tracheal deviation present. No thyromegaly present.  Cardiovascular: Normal rate, regular rhythm, normal heart sounds and intact distal pulses.  Exam reveals no gallop and no friction rub.   No murmur heard. Pulmonary/Chest: Effort normal and breath sounds normal. No accessory muscle usage. No tachypnea. No respiratory distress. She has no decreased breath sounds. She has no wheezes. She has no rhonchi. She has no rales. She exhibits no tenderness.  Abdominal: Soft. Bowel sounds are normal. She exhibits no distension and no mass. There is no tenderness. There is no rebound and no guarding.  Musculoskeletal: Normal range of motion. She exhibits no edema or tenderness.  Lymphadenopathy:    She has no cervical adenopathy.  Neurological: She is alert and oriented to person, place, and time. No cranial nerve deficit. She exhibits normal muscle tone. Coordination normal.  Skin: Skin is warm and dry. No rash noted. She is not diaphoretic. No erythema. No pallor.  Psychiatric: She has a normal mood and affect. Her behavior is normal. Judgment and thought content normal.          Assessment & Plan:  Over Friday of which >50% spent in face-to-face contact with patient discussing plan of care  Problem List Items Addressed This Visit      High   Hypertension    BP Readings from Last 3  Encounters:  05/16/14 146/75  04/26/14 122/58  03/30/14 130/66   BP slightly elevated, but will hold on adding medication, given recent GI bleeding. Recheck next week.    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 Continue Lantus. Referral to pt assistance  for help with medication.    Relevant Orders      Comprehensive metabolic panel      Hemoglobin A1c      Lipid panel      Microalbumin / creatinine urine ratio     Unprioritized   Atrial fibrillation    NSR today on exam. Continue Amiodarone. Hold Eliquis given recent GI bleeding.    Chronic back pain   Relevant Medications      cyclobenzaprine (FLEXERIL) tablet   Hematochezia - Primary    Recent hematochezia in setting of use of Eliquis. Will have her hold Eliquis.. Will check CBC today. GI Evaluation. We discussed risk of life threatening hemorrhage on Eliquis. ED evaluation if any recurrent bleeding.    Relevant Orders      CBC w/Diff      Ambulatory referral to Gastroenterology       Return in about 2 years (around 05/16/2016) for Recheck.

## 2014-05-16 NOTE — Assessment & Plan Note (Addendum)
BP Readings from Last 3 Encounters:  05/16/14 146/75  04/26/14 122/58  03/30/14 130/66   BP slightly elevated, but will hold on adding medication, given recent GI bleeding. Recheck next week.

## 2014-05-17 ENCOUNTER — Telehealth: Payer: Self-pay | Admitting: Internal Medicine

## 2014-05-17 LAB — CBC WITH DIFFERENTIAL/PLATELET
BASOS ABS: 0 10*3/uL (ref 0.0–0.1)
Basophil %: 0.6 %
EOS PCT: 3.5 %
Eosinophil #: 0.2 10*3/uL (ref 0.0–0.7)
HCT: 31.9 % — ABNORMAL LOW (ref 35.0–47.0)
HGB: 10.5 g/dL — AB (ref 12.0–16.0)
Lymphocyte #: 2.1 10*3/uL (ref 1.0–3.6)
Lymphocyte %: 31.7 %
MCH: 29.1 pg (ref 26.0–34.0)
MCHC: 32.9 g/dL (ref 32.0–36.0)
MCV: 88 fL (ref 80–100)
Monocyte #: 0.7 x10 3/mm (ref 0.2–0.9)
Monocyte %: 10.1 %
NEUTROS ABS: 3.6 10*3/uL (ref 1.4–6.5)
NEUTROS PCT: 54.1 %
Platelet: 240 10*3/uL (ref 150–440)
RBC: 3.61 10*6/uL — ABNORMAL LOW (ref 3.80–5.20)
RDW: 16 % — ABNORMAL HIGH (ref 11.5–14.5)
WBC: 6.7 10*3/uL (ref 3.6–11.0)

## 2014-05-17 LAB — PROTIME-INR
INR: 1.1
Prothrombin Time: 13.6 secs (ref 11.5–14.7)

## 2014-05-17 NOTE — Telephone Encounter (Signed)
emmi mailed  °

## 2014-05-18 ENCOUNTER — Telehealth: Payer: Self-pay | Admitting: Internal Medicine

## 2014-05-18 LAB — CBC WITH DIFFERENTIAL/PLATELET
BASOS PCT: 0.9 %
Basophil #: 0.1 10*3/uL (ref 0.0–0.1)
EOS ABS: 0.3 10*3/uL (ref 0.0–0.7)
Eosinophil %: 3.5 %
HCT: 32.6 % — ABNORMAL LOW (ref 35.0–47.0)
HGB: 10.6 g/dL — ABNORMAL LOW (ref 12.0–16.0)
LYMPHS PCT: 27.8 %
Lymphocyte #: 2 10*3/uL (ref 1.0–3.6)
MCH: 28.6 pg (ref 26.0–34.0)
MCHC: 32.5 g/dL (ref 32.0–36.0)
MCV: 88 fL (ref 80–100)
MONO ABS: 0.7 x10 3/mm (ref 0.2–0.9)
Monocyte %: 9.2 %
NEUTROS ABS: 4.2 10*3/uL (ref 1.4–6.5)
NEUTROS PCT: 58.6 %
Platelet: 237 10*3/uL (ref 150–440)
RBC: 3.69 10*6/uL — ABNORMAL LOW (ref 3.80–5.20)
RDW: 15.8 % — AB (ref 11.5–14.5)
WBC: 7.2 10*3/uL (ref 3.6–11.0)

## 2014-05-18 LAB — BASIC METABOLIC PANEL
Anion Gap: 7 (ref 7–16)
BUN: 12 mg/dL (ref 7–18)
CO2: 29 mmol/L (ref 21–32)
Calcium, Total: 8.2 mg/dL — ABNORMAL LOW (ref 8.5–10.1)
Chloride: 110 mmol/L — ABNORMAL HIGH (ref 98–107)
Creatinine: 1.01 mg/dL (ref 0.60–1.30)
EGFR (African American): >60
EGFR (Non-African Amer.): 56 — ABNORMAL LOW
Glucose: 87 mg/dL (ref 65–99)
Osmolality: 290 (ref 275–301)
POTASSIUM: 3.7 mmol/L (ref 3.5–5.1)
Sodium: 146 mmol/L — ABNORMAL HIGH (ref 136–145)

## 2014-05-18 LAB — HM COLONOSCOPY

## 2014-05-18 LAB — PROTIME-INR
INR: 1
Prothrombin Time: 12.9 secs (ref 11.5–14.7)

## 2014-05-18 NOTE — Telephone Encounter (Signed)
Needs HFU dx: rectal bleed, d/c 12/30. Please advise where to add to schedule/msn

## 2014-05-18 NOTE — Telephone Encounter (Signed)
Julie-please document flu shot or decline when you talk to her, thanks!

## 2014-05-18 NOTE — Telephone Encounter (Signed)
Please advise appt time for hospital follow up appt

## 2014-05-18 NOTE — Telephone Encounter (Signed)
1/6 at 12:30pm

## 2014-05-19 ENCOUNTER — Telehealth: Payer: Self-pay | Admitting: *Deleted

## 2014-05-19 NOTE — Telephone Encounter (Signed)
Transition Care Management Follow-up Telephone Call  How have you been since you were released from the hospital? Pt stated "fine" -  Pt stated that she had someone on the other line and would need to call me back, therefore our conversation was very short, advised pt to call us if she had any concerns      Do you understand why you were in the hospital? YES   Do you understand the discharge instrcutions? YES  Items Reviewed:  Medications reviewed: NO  Allergies reviewed: NO  Dietary changes reviewed: /NO  Referrals reviewed:NO   Functional Questionnaire:   Activities of Daily Living (ADLs):   She states they are independent in the following:  States they require assistance with the following:    Any transportation issues/concerns?: NO   Any patient concerns? {/NO  Confirmed importance and date/time of follow-up visits scheduled: YES 05/25/14 @ 12:30   Confirmed with patient if condition begins to worsen call PCP or go to the ER.  Patient was given the Call-a-Nurse line [phone]: YES

## 2014-05-23 NOTE — Telephone Encounter (Signed)
Pt already added to schedule

## 2014-05-25 ENCOUNTER — Ambulatory Visit (INDEPENDENT_AMBULATORY_CARE_PROVIDER_SITE_OTHER): Payer: Commercial Managed Care - HMO | Admitting: Internal Medicine

## 2014-05-25 ENCOUNTER — Encounter: Payer: Self-pay | Admitting: Internal Medicine

## 2014-05-25 VITALS — BP 145/73 | HR 64 | Temp 98.1°F | Ht 63.0 in | Wt 228.5 lb

## 2014-05-25 DIAGNOSIS — I480 Paroxysmal atrial fibrillation: Secondary | ICD-10-CM

## 2014-05-25 DIAGNOSIS — K922 Gastrointestinal hemorrhage, unspecified: Secondary | ICD-10-CM

## 2014-05-25 DIAGNOSIS — I63239 Cerebral infarction due to unspecified occlusion or stenosis of unspecified carotid arteries: Secondary | ICD-10-CM

## 2014-05-25 LAB — CBC WITH DIFFERENTIAL/PLATELET
Basophils Absolute: 0 10*3/uL (ref 0.0–0.1)
Basophils Relative: 0.5 % (ref 0.0–3.0)
EOS ABS: 0.2 10*3/uL (ref 0.0–0.7)
Eosinophils Relative: 2 % (ref 0.0–5.0)
HEMATOCRIT: 34.7 % — AB (ref 36.0–46.0)
Hemoglobin: 11.2 g/dL — ABNORMAL LOW (ref 12.0–15.0)
LYMPHS ABS: 2.4 10*3/uL (ref 0.7–4.0)
LYMPHS PCT: 28.1 % (ref 12.0–46.0)
MCHC: 32.4 g/dL (ref 30.0–36.0)
MCV: 87.3 fl (ref 78.0–100.0)
Monocytes Absolute: 0.6 10*3/uL (ref 0.1–1.0)
Monocytes Relative: 7.1 % (ref 3.0–12.0)
NEUTROS PCT: 62.3 % (ref 43.0–77.0)
Neutro Abs: 5.2 10*3/uL (ref 1.4–7.7)
PLATELETS: 292 10*3/uL (ref 150.0–400.0)
RBC: 3.97 Mil/uL (ref 3.87–5.11)
RDW: 16.6 % — AB (ref 11.5–15.5)
WBC: 8.4 10*3/uL (ref 4.0–10.5)

## 2014-05-25 LAB — COMPREHENSIVE METABOLIC PANEL
ALK PHOS: 88 U/L (ref 39–117)
ALT: 16 U/L (ref 0–35)
AST: 17 U/L (ref 0–37)
Albumin: 3.7 g/dL (ref 3.5–5.2)
BUN: 32 mg/dL — ABNORMAL HIGH (ref 6–23)
CO2: 28 meq/L (ref 19–32)
Calcium: 9.2 mg/dL (ref 8.4–10.5)
Chloride: 105 mEq/L (ref 96–112)
Creatinine, Ser: 1.4 mg/dL — ABNORMAL HIGH (ref 0.4–1.2)
GFR: 38.43 mL/min — ABNORMAL LOW (ref >60.00)
GLUCOSE: 167 mg/dL — AB (ref 70–99)
Potassium: 4.7 mEq/L (ref 3.5–5.1)
SODIUM: 140 meq/L (ref 135–145)
TOTAL PROTEIN: 6.8 g/dL (ref 6.0–8.3)
Total Bilirubin: 0.6 mg/dL (ref 0.2–1.2)

## 2014-05-25 MED ORDER — APIXABAN 5 MG PO TABS: 5.0000 mg | ORAL_TABLET | Freq: Two times a day (BID) | ORAL | Status: DC

## 2014-05-25 NOTE — Progress Notes (Signed)
Pre visit review using our clinic review tool, if applicable. No additional management support is needed unless otherwise documented below in the visit note. 

## 2014-05-25 NOTE — Assessment & Plan Note (Signed)
Reviewed imaging from previous stroke. Expressive aphasia likely secondary to stroke. Will resume Eliquis for stroke prevention in setting of Afib.

## 2014-05-25 NOTE — Assessment & Plan Note (Signed)
Recent lower GI bleeding, likely diverticular, however no active bleeding seen on colonoscopy. Will recheck CBC and CMP with labs today. Resume Eliquis. Pt will call if any recurrent symptoms.

## 2014-05-25 NOTE — Progress Notes (Signed)
Subjective:    Patient ID: [name] [name], female    DOB: 02/25/1935, 80 y.o.   MRN: [id number]  HPI 79YO female presents for hospital follow up.  ADMITTED: 05/16/2014 DISCHARGED: 05/18/2014  DIAGNOSIS: Lower GI Bleeding.  Seen in clinic 12/28 with 3 day history of BRBPR. Colonoscopy showed multiple diverticuli but no acute bleeding. Discharged home and did not Eliquis. No recurrent rectal bleeding since discharge. Having regular BMs. No abdominal pain. Eating a regular diet. No new concerns today.  Continues to have chronic issues with expressive aphasia and word finding, ever since stroke.  Past medical, surgical, family and social history per today's encounter.  Review of Systems  Constitutional: Negative for fever, chills, appetite change, fatigue and unexpected weight change.  Eyes: Negative for visual disturbance.  Respiratory: Negative for shortness of breath.   Cardiovascular: Negative for chest pain, palpitations and leg swelling.  Gastrointestinal: Negative for nausea, vomiting, abdominal pain, diarrhea, constipation, blood in stool, abdominal distention, anal bleeding and rectal pain.  Musculoskeletal: Positive for myalgias and arthralgias.  Skin: Negative for color change and rash.  Hematological: Negative for adenopathy. Does not bruise/bleed easily.  Psychiatric/Behavioral: Negative for sleep disturbance and dysphoric mood. The patient is not nervous/anxious.        Objective:    BP 145/73 mmHg  Pulse 64  Temp(Src) 98.1 F (36.7 C) (Oral)  Ht 5\' 3"  (1.6 m)  Wt 228 lb 8 oz (103.647 kg)  BMI 40.49 kg/m2  SpO2 96% Physical Exam  Constitutional: She is oriented to person, place, and time. She appears well-developed and well-nourished. No distress.  HENT:  Head: Normocephalic and atraumatic.  Right Ear: External ear normal.  Left Ear: External ear normal.  Nose: Nose normal.  Mouth/Throat: Oropharynx is clear and moist. No oropharyngeal exudate.    Eyes: Conjunctivae are normal. Pupils are equal, round, and reactive to light. Right eye exhibits no discharge. Left eye exhibits no discharge. No scleral icterus.  Neck: Normal range of motion. Neck supple. No tracheal deviation present. No thyromegaly present.  Cardiovascular: Normal rate, regular rhythm, normal heart sounds and intact distal pulses.  Exam reveals no gallop and no friction rub.   No murmur heard. Pulmonary/Chest: Effort normal and breath sounds normal. No accessory muscle usage. No tachypnea. No respiratory distress. She has no decreased breath sounds. She has no wheezes. She has no rhonchi. She has no rales. She exhibits no tenderness.  Musculoskeletal: Normal range of motion. She exhibits no edema or tenderness.  Lymphadenopathy:    She has no cervical adenopathy.  Neurological: She is alert and oriented to person, place, and time. No cranial nerve deficit. She exhibits normal muscle tone. Coordination normal.  Skin: Skin is warm and dry. No rash noted. She is not diaphoretic. No erythema. No pallor.  Psychiatric: She has a normal mood and affect. Her behavior is normal. Judgment and thought content normal.          Assessment & Plan:   Problem List Items Addressed This Visit      High   CVA (cerebral infarction)    Reviewed imaging from previous stroke. Expressive aphasia likely secondary to stroke. Will resume Eliquis for stroke prevention in setting of Afib.    Relevant Medications      apixaban (ELIQUIS) tablet     Unprioritized   Atrial fibrillation    Rate well controlled on Metoprolol and Amiodarone. Appears to be in NSR today. Discussed pros and cons of starting back on Eliquis. Will  restart medication. She will call if any concerns. Follow up as scheduled with Dr. Mariah Milling.    Relevant Medications      apixaban (ELIQUIS) tablet   Lower GI bleeding - Primary    Recent lower GI bleeding, likely diverticular, however no active bleeding seen on  colonoscopy. Will recheck CBC and CMP with labs today. Resume Eliquis. Pt will call if any recurrent symptoms.    Relevant Orders      CBC w/Diff      Comprehensive metabolic panel       Return in about 4 weeks (around 06/22/2014) for Recheck.

## 2014-05-25 NOTE — Patient Instructions (Signed)
Start back on Eliquis.  Call immediately if any recurrent bleeding.

## 2014-05-25 NOTE — Assessment & Plan Note (Signed)
Rate well controlled on Metoprolol and Amiodarone. Appears to be in NSR today. Discussed pros and cons of starting back on Eliquis. Will restart medication. She will call if any concerns. Follow up as scheduled with Dr. Mariah Milling.

## 2014-05-26 ENCOUNTER — Telehealth: Payer: Self-pay

## 2014-05-26 ENCOUNTER — Other Ambulatory Visit: Payer: Self-pay | Admitting: *Deleted

## 2014-05-26 DIAGNOSIS — F329 Major depressive disorder, single episode, unspecified: Secondary | ICD-10-CM

## 2014-05-26 DIAGNOSIS — F32A Depression, unspecified: Secondary | ICD-10-CM

## 2014-05-26 MED ORDER — SERTRALINE HCL 50 MG PO TABS: 50.0000 mg | ORAL_TABLET | Freq: Every day | ORAL | Status: DC

## 2014-05-26 MED ORDER — DILTIAZEM HCL ER COATED BEADS 120 MG PO CP24: 120.0000 mg | ORAL_CAPSULE | Freq: Every day | ORAL | Status: DC

## 2014-05-26 NOTE — Telephone Encounter (Signed)
The patient called hoping to get her blood test results.  She stated she "does not remember" what she was told previously.

## 2014-05-26 NOTE — Telephone Encounter (Signed)
Spoke with pt and reviewed lab results again.

## 2014-05-27 ENCOUNTER — Other Ambulatory Visit: Payer: Self-pay | Admitting: *Deleted

## 2014-05-27 DIAGNOSIS — F32A Depression, unspecified: Secondary | ICD-10-CM

## 2014-05-27 DIAGNOSIS — F329 Major depressive disorder, single episode, unspecified: Secondary | ICD-10-CM

## 2014-05-27 MED ORDER — DILTIAZEM HCL ER COATED BEADS 120 MG PO CP24: 120.0000 mg | ORAL_CAPSULE | Freq: Every day | ORAL | Status: DC

## 2014-05-27 MED ORDER — SERTRALINE HCL 50 MG PO TABS: 50.0000 mg | ORAL_TABLET | Freq: Every day | ORAL | Status: AC

## 2014-06-24 ENCOUNTER — Observation Stay (HOSPITAL_COMMUNITY)
Admission: EM | Admit: 2014-06-24 | Discharge: 2014-06-26 | Disposition: A | Discharge status: Home / Self Care | Payer: Commercial Managed Care - HMO | Attending: Cardiology | Admitting: Cardiology

## 2014-06-24 ENCOUNTER — Encounter (HOSPITAL_COMMUNITY): Payer: Self-pay | Admitting: Nurse Practitioner

## 2014-06-24 ENCOUNTER — Telehealth: Payer: Self-pay | Admitting: Internal Medicine

## 2014-06-24 ENCOUNTER — Other Ambulatory Visit: Payer: Self-pay | Admitting: Internal Medicine

## 2014-06-24 DIAGNOSIS — I10 Essential (primary) hypertension: Secondary | ICD-10-CM | POA: Diagnosis present

## 2014-06-24 DIAGNOSIS — R001 Bradycardia, unspecified: Principal | ICD-10-CM | POA: Insufficient documentation

## 2014-06-24 DIAGNOSIS — I4891 Unspecified atrial fibrillation: Secondary | ICD-10-CM | POA: Diagnosis present

## 2014-06-24 DIAGNOSIS — N390 Urinary tract infection, site not specified: Secondary | ICD-10-CM | POA: Diagnosis present

## 2014-06-24 DIAGNOSIS — Z8781 Personal history of (healed) traumatic fracture: Secondary | ICD-10-CM | POA: Diagnosis not present

## 2014-06-24 DIAGNOSIS — N183 Chronic kidney disease, stage 3 unspecified: Secondary | ICD-10-CM | POA: Diagnosis present

## 2014-06-24 DIAGNOSIS — E119 Type 2 diabetes mellitus without complications: Secondary | ICD-10-CM | POA: Diagnosis not present

## 2014-06-24 DIAGNOSIS — Z79899 Other long term (current) drug therapy: Secondary | ICD-10-CM | POA: Diagnosis not present

## 2014-06-24 DIAGNOSIS — E1165 Type 2 diabetes mellitus with hyperglycemia: Secondary | ICD-10-CM | POA: Diagnosis present

## 2014-06-24 DIAGNOSIS — K219 Gastro-esophageal reflux disease without esophagitis: Secondary | ICD-10-CM | POA: Diagnosis not present

## 2014-06-24 DIAGNOSIS — E039 Hypothyroidism, unspecified: Secondary | ICD-10-CM | POA: Diagnosis not present

## 2014-06-24 DIAGNOSIS — F329 Major depressive disorder, single episode, unspecified: Secondary | ICD-10-CM | POA: Insufficient documentation

## 2014-06-24 DIAGNOSIS — M069 Rheumatoid arthritis, unspecified: Secondary | ICD-10-CM | POA: Diagnosis not present

## 2014-06-24 DIAGNOSIS — E669 Obesity, unspecified: Secondary | ICD-10-CM | POA: Insufficient documentation

## 2014-06-24 DIAGNOSIS — M1990 Unspecified osteoarthritis, unspecified site: Secondary | ICD-10-CM | POA: Insufficient documentation

## 2014-06-24 DIAGNOSIS — Z794 Long term (current) use of insulin: Secondary | ICD-10-CM | POA: Diagnosis not present

## 2014-06-24 DIAGNOSIS — IMO0002 Reserved for concepts with insufficient information to code with codable children: Secondary | ICD-10-CM | POA: Diagnosis present

## 2014-06-24 DIAGNOSIS — R42 Dizziness and giddiness: Secondary | ICD-10-CM | POA: Diagnosis present

## 2014-06-24 DIAGNOSIS — I2510 Atherosclerotic heart disease of native coronary artery without angina pectoris: Secondary | ICD-10-CM | POA: Insufficient documentation

## 2014-06-24 DIAGNOSIS — Z8673 Personal history of transient ischemic attack (TIA), and cerebral infarction without residual deficits: Secondary | ICD-10-CM | POA: Insufficient documentation

## 2014-06-24 DIAGNOSIS — R55 Syncope and collapse: Secondary | ICD-10-CM | POA: Diagnosis not present

## 2014-06-24 DIAGNOSIS — E785 Hyperlipidemia, unspecified: Secondary | ICD-10-CM | POA: Diagnosis present

## 2014-06-24 DIAGNOSIS — I639 Cerebral infarction, unspecified: Secondary | ICD-10-CM | POA: Diagnosis present

## 2014-06-24 DIAGNOSIS — F32A Depression, unspecified: Secondary | ICD-10-CM | POA: Diagnosis present

## 2014-06-24 HISTORY — DX: Chronic kidney disease, stage 3 (moderate): N18.3

## 2014-06-24 HISTORY — DX: Chronic kidney disease, stage 3 unspecified: N18.30

## 2014-06-24 HISTORY — DX: Paroxysmal atrial fibrillation: I48.0

## 2014-06-24 LAB — I-STAT CHEM 8, ED
BUN: 34 mg/dL — ABNORMAL HIGH (ref 6–23)
CALCIUM ION: 1.21 mmol/L (ref 1.13–1.30)
Chloride: 105 mmol/L (ref 96–112)
Creatinine, Ser: 1.5 mg/dL — ABNORMAL HIGH (ref 0.50–1.10)
Glucose, Bld: 139 mg/dL — ABNORMAL HIGH (ref 70–99)
HCT: 36 % (ref 36.0–46.0)
HEMOGLOBIN: 12.2 g/dL (ref 12.0–15.0)
POTASSIUM: 5.3 mmol/L — AB (ref 3.5–5.1)
Sodium: 142 mmol/L (ref 135–145)
TCO2: 22 mmol/L (ref 0–100)

## 2014-06-24 LAB — BASIC METABOLIC PANEL
Anion gap: 5 (ref 5–15)
BUN: 32 mg/dL — ABNORMAL HIGH (ref 6–23)
CO2: 26 mmol/L (ref 19–32)
Calcium: 9.1 mg/dL (ref 8.4–10.5)
Chloride: 109 mmol/L (ref 96–112)
Creatinine, Ser: 1.41 mg/dL — ABNORMAL HIGH (ref 0.50–1.10)
GFR calc Af Amer: 40 mL/min — ABNORMAL LOW (ref >90)
GFR calc non Af Amer: 35 mL/min — ABNORMAL LOW (ref >90)
Glucose, Bld: 142 mg/dL — ABNORMAL HIGH (ref 70–99)
Potassium: 5.3 mmol/L — ABNORMAL HIGH (ref 3.5–5.1)
Sodium: 140 mmol/L (ref 135–145)

## 2014-06-24 LAB — CBC
HCT: 33.6 % — ABNORMAL LOW (ref 36.0–46.0)
HEMOGLOBIN: 11.1 g/dL — AB (ref 12.0–15.0)
MCH: 28.9 pg (ref 26.0–34.0)
MCHC: 33 g/dL (ref 30.0–36.0)
MCV: 87.5 fL (ref 78.0–100.0)
Platelets: 279 10*3/uL (ref 150–400)
RBC: 3.84 MIL/uL — ABNORMAL LOW (ref 3.87–5.11)
RDW: 15.8 % — AB (ref 11.5–15.5)
WBC: 9.3 10*3/uL (ref 4.0–10.5)

## 2014-06-24 MED ORDER — LEVOTHYROXINE SODIUM 150 MCG PO TABS
150.0000 ug | ORAL_TABLET | Freq: Every day | ORAL | Status: DC
  Administered 2014-06-25 – 2014-06-26 (×2): 150 ug via ORAL
  Filled 2014-06-24: qty 1
  Filled 2014-06-24: qty 2
  Filled 2014-06-24: qty 1
  Filled 2014-06-24: qty 2
  Filled 2014-06-24: qty 1

## 2014-06-24 MED ORDER — INSULIN ASPART 100 UNIT/ML ~~LOC~~ SOLN
0.0000 [IU] | Freq: Every day | SUBCUTANEOUS | Status: DC
  Administered 2014-06-25: 2 [IU] via SUBCUTANEOUS

## 2014-06-24 MED ORDER — ATORVASTATIN CALCIUM 20 MG PO TABS
20.0000 mg | ORAL_TABLET | Freq: Every day | ORAL | Status: DC
  Administered 2014-06-25 – 2014-06-26 (×2): 20 mg via ORAL
  Filled 2014-06-24 (×2): qty 1

## 2014-06-24 MED ORDER — LATANOPROST 0.005 % OP SOLN
1.0000 [drp] | Freq: Every day | OPHTHALMIC | Status: DC
  Administered 2014-06-25: 1 [drp] via OPHTHALMIC
  Filled 2014-06-24: qty 2.5

## 2014-06-24 MED ORDER — INSULIN GLARGINE 100 UNIT/ML ~~LOC~~ SOLN: 50.0000 [IU] | Freq: Every day | SUBCUTANEOUS | Status: DC

## 2014-06-24 MED ORDER — INSULIN GLARGINE 100 UNIT/ML ~~LOC~~ SOLN
30.0000 [IU] | Freq: Every day | SUBCUTANEOUS | Status: DC
  Administered 2014-06-25: 30 [IU] via SUBCUTANEOUS
  Filled 2014-06-24 (×3): qty 0.3

## 2014-06-24 MED ORDER — TRAMADOL HCL 50 MG PO TABS
100.0000 mg | ORAL_TABLET | Freq: Three times a day (TID) | ORAL | Status: DC | PRN
Start: 2014-06-24 — End: 2014-06-26
  Administered 2014-06-25: 100 mg via ORAL
  Filled 2014-06-24: qty 2

## 2014-06-24 MED ORDER — PANTOPRAZOLE SODIUM 40 MG PO TBEC
40.0000 mg | DELAYED_RELEASE_TABLET | Freq: Every day | ORAL | Status: DC
  Administered 2014-06-25 – 2014-06-26 (×2): 40 mg via ORAL
  Filled 2014-06-24 (×2): qty 1

## 2014-06-24 MED ORDER — FUROSEMIDE 20 MG PO TABS: 40.0000 mg | ORAL_TABLET | Freq: Two times a day (BID) | ORAL | Status: DC

## 2014-06-24 MED ORDER — GABAPENTIN 300 MG PO CAPS
300.0000 mg | ORAL_CAPSULE | Freq: Three times a day (TID) | ORAL | Status: DC
  Administered 2014-06-25 – 2014-06-26 (×4): 300 mg via ORAL
  Filled 2014-06-24 (×4): qty 1

## 2014-06-24 MED ORDER — CYCLOBENZAPRINE HCL 10 MG PO TABS: 5.0000 mg | ORAL_TABLET | Freq: Three times a day (TID) | ORAL | Status: DC | PRN

## 2014-06-24 MED ORDER — APIXABAN 5 MG PO TABS
5.0000 mg | ORAL_TABLET | Freq: Two times a day (BID) | ORAL | Status: DC
  Administered 2014-06-25 – 2014-06-26 (×3): 5 mg via ORAL
  Filled 2014-06-24 (×3): qty 1

## 2014-06-24 MED ORDER — FERROUS SULFATE 325 (65 FE) MG PO TABS
325.0000 mg | ORAL_TABLET | Freq: Every day | ORAL | Status: DC
  Administered 2014-06-25 – 2014-06-26 (×2): 325 mg via ORAL
  Filled 2014-06-24 (×2): qty 1

## 2014-06-24 MED ORDER — INSULIN ASPART 100 UNIT/ML ~~LOC~~ SOLN
0.0000 [IU] | Freq: Three times a day (TID) | SUBCUTANEOUS | Status: DC
  Administered 2014-06-25 (×3): 5 [IU] via SUBCUTANEOUS
  Administered 2014-06-26: 3 [IU] via SUBCUTANEOUS

## 2014-06-24 MED ORDER — ALBUTEROL SULFATE HFA 108 (90 BASE) MCG/ACT IN AERS: 2.0000 | INHALATION_SPRAY | Freq: Four times a day (QID) | RESPIRATORY_TRACT | Status: DC | PRN

## 2014-06-24 NOTE — ED Notes (Signed)
Per EMS patient form home endorses dizziness and near syncope when standing up. On EMS arrival pt warm and dry, HR 40's bpm EKG- junctional rhythm. Patient denies pain at present time.

## 2014-06-24 NOTE — H&P (Signed)
Admission History and Physical     Patient ID: ZOHRA CLAVEL, MRN: [id number], DOB: [birth year] 80 y.o. Date of Encounter: 06/24/2014, 11:50 PM  Primary Physician: [name], MD Primary Cardiologist: Dr. Mariah Milling   Chief Complaint:  lightheadedness  History of Present Illness: DIETRICH KE is a 80 y.o. female with a hsitory of CAD (prior PCI and stent restenosis of RCA), paroxysmal AFib, IDDM, who presents with lightheadedness.  For the past two days she has gotten very lightheaded when standing.  Has not lost consciousness.  Denies CP, SOB.   In the ED she was found to be in sinus bradycardia at a rate of 40.  On standing in the ED her cuff BP remained stable but her heart rate stayed in the low 40s and she developed lightheadedness. She took her dilitazem and her metoprolol this morning.     Past Medical History  Diagnosis Date  . DM (diabetes mellitus)   . Paroxysmal atrial flutter   . Dyslipidemia   . CAD (coronary artery disease)     a. s/p prior PCI RCA with ISR req CBA;  b. 04/2013 Neg MV;  c. 05/2013 Cath: LM 20, LAD 30p, LCX 20p, OM1 90 small, RCA 18m, PDA 40-->Med Rx.  . Osteoarthritis   . Hypothyroidism     hx  . Rheumatoid arthritis(714.0)   . Obesity   . GERD (gastroesophageal reflux disease)   . Depression   . Shoulder fracture, left     Dr. Martha Clan  . Cerebral infarction      Past Surgical History  Procedure Laterality Date  . Rca stent placement  2000  . Restenosis with ptca placement  2005  . Cataract surgery    . Partial hysterectomy    . Replacement total knee  2013    right  . Shoulder surgery  02/04/13  . Cardiac catheterization  06/11/2013  . Coronary angioplasty        Current Facility-Administered Medications  Medication Dose Route Frequency Provider Last Rate Last Dose  . albuterol (PROVENTIL HFA;VENTOLIN HFA) 108 (90 BASE) MCG/ACT inhaler 2 puff  2 puff Inhalation Q6H PRN Yaakov Guthrie, MD      . Melene Muller ON 06/25/2014]  apixaban (ELIQUIS) tablet 5 mg  5 mg Oral BID Yaakov Guthrie, MD      . Melene Muller ON 06/25/2014] atorvastatin (LIPITOR) tablet 20 mg  20 mg Oral Daily Yaakov Guthrie, MD      . cyclobenzaprine (FLEXERIL) tablet 5 mg  5 mg Oral TID PRN Yaakov Guthrie, MD      . Melene Muller ON 06/25/2014] ferrous sulfate tablet 325 mg  325 mg Oral Q breakfast Yaakov Guthrie, MD      . Melene Muller ON 06/25/2014] furosemide (LASIX) tablet 40 mg  40 mg Oral BID Yaakov Guthrie, MD      . Melene Muller ON 06/25/2014] gabapentin (NEURONTIN) capsule 300 mg  300 mg Oral TID Yaakov Guthrie, MD      . Melene Muller ON 06/25/2014] insulin aspart (novoLOG) injection 0-15 Units  0-15 Units Subcutaneous TID WC Yaakov Guthrie, MD      . Melene Muller ON 06/25/2014] insulin aspart (novoLOG) injection 0-5 Units  0-5 Units Subcutaneous QHS Yaakov Guthrie, MD      . Melene Muller ON 06/25/2014] insulin glargine (LANTUS) injection 30 Units  30 Units Subcutaneous QHS Yaakov Guthrie, MD      . Melene Muller ON 06/25/2014] latanoprost (XALATAN) 0.005 % ophthalmic solution 1 drop  1 drop Both Eyes QHS Yaakov Guthrie, MD      . [  START ON 06/25/2014] levothyroxine (SYNTHROID, LEVOTHROID) tablet 150 mcg  150 mcg Oral Daily Yaakov Guthrie, MD      . Melene Muller ON 06/25/2014] pantoprazole (PROTONIX) EC tablet 40 mg  40 mg Oral Daily Yaakov Guthrie, MD      . traMADol Janean Sark) tablet 100 mg  100 mg Oral Q8H PRN Yaakov Guthrie, MD       Current Outpatient Prescriptions  Medication Sig Dispense Refill  . apixaban (ELIQUIS) 5 MG TABS tablet Take 1 tablet (5 mg total) by mouth 2 (two) times daily. 60 tablet 12  . atorvastatin (LIPITOR) 20 MG tablet Take 1 tablet (20 mg total) by mouth daily. 30 tablet 3  . cyclobenzaprine (FLEXERIL) 5 MG tablet Take 1 tablet (5 mg total) by mouth 3 (three) times daily as needed. 90 tablet 1  . diltiazem (CARDIZEM CD) 120 MG 24 hr capsule Take 1 capsule (120 mg total) by mouth daily. 90 capsule 1  . ferrous sulfate 325 (65 FE) MG tablet Take 325 mg by mouth daily with breakfast.    . furosemide (LASIX) 40 MG  tablet Take 1 tablet (40 mg total) by mouth 2 (two) times daily. 180 tablet 3  . gabapentin (NEURONTIN) 300 MG capsule Take 1 capsule (300 mg total) by mouth 3 (three) times daily. 270 capsule 3  . insulin aspart (NOVOLOG) 100 UNIT/ML injection Before each meal 3 times a day, 140-199 - 2 units, 200-250 - 4 units, 251-299 - 6 units,  300-349 - 8 units,  350 or above 10 units. Insulin PEN if approved, provide syringes and needles if needed. 10 mL 11  . insulin glargine (LANTUS) 100 UNIT/ML injection Inject 0.5 mLs (50 Units total) into the skin at bedtime. 30 mL 3  . latanoprost (XALATAN) 0.005 % ophthalmic solution Place 1 drop into both eyes at bedtime.     Marland Kitchen levothyroxine (LEVOTHROID) 150 MCG tablet Take 1 tablet (150 mcg total) by mouth daily. 90 tablet 3  . lisinopril (PRINIVIL,ZESTRIL) 10 MG tablet Take 1 tablet (10 mg total) by mouth daily. 90 tablet 1  . meclizine (ANTIVERT) 25 MG tablet Take 1 tablet (25 mg total) by mouth 2 (two) times daily. As needed for dizziness or nausea (Patient taking differently: Take 25 mg by mouth 2 (two) times daily as needed. As needed for dizziness or nausea) 60 tablet 3  . metFORMIN (GLUCOPHAGE) 500 MG tablet Take 1 tablet (500 mg total) by mouth 2 (two) times daily. 180 tablet 3  . metoprolol tartrate (LOPRESSOR) 25 MG tablet Take 1 tablet (25 mg total) by mouth 2 (two) times daily as needed. 180 tablet 3  . omeprazole (PRILOSEC) 20 MG capsule Take 1 capsule (20 mg total) by mouth 2 (two) times daily. 180 capsule 3  . potassium chloride SA (K-DUR,KLOR-CON) 20 MEQ tablet Take 1 tablet (20 mEq total) by mouth 2 (two) times daily. 180 tablet 3  . traMADol (ULTRAM) 50 MG tablet Take 2 tablets (100 mg total) by mouth every 8 (eight) hours as needed for moderate pain. 90 tablet 1  . albuterol (PROVENTIL HFA;VENTOLIN HFA) 108 (90 BASE) MCG/ACT inhaler Inhale 2 puffs into the lungs every 6 (six) hours as needed for wheezing or shortness of breath. 1 Inhaler 0  .  amiodarone (PACERONE) 200 MG tablet Take 1 tablet (200 mg total) by mouth 2 (two) times daily. 70 tablet 3  . diltiazem (CARDIZEM CD) 120 MG 24 hr capsule TAKE 1 CAPSULE (120 MG TOTAL) BY MOUTH DAILY. 30 capsule  3  . fluticasone (FLONASE) 50 MCG/ACT nasal spray Place 2 sprays into the nose daily. 16 g 3  . nitroGLYCERIN (NITROSTAT) 0.4 MG SL tablet Place 1 tablet (0.4 mg total) under the tongue every 5 (five) minutes as needed. 25 tablet 3  . sertraline (ZOLOFT) 50 MG tablet Take 1 tablet (50 mg total) by mouth daily. 90 tablet 0      Allergies: Allergies  Allergen Reactions  . Oxycodone Nausea And Vomiting     Social History:  The patient  reports that she has never smoked. She has never used smokeless tobacco. She reports that she does not drink alcohol or use illicit drugs.   Family History:  The patient's family history includes Cancer in her mother; Diabetes in her mother; Esophageal cancer in her mother.   ROS:  Please see the history of present illness.  All other systems reviewed and negative.   Vital Signs: Blood pressure 149/48, pulse 43, temperature 98.9 F (37.2 C), temperature source Oral, resp. rate 15, height  (1.6 m), weight 90.719 kg (200 lb), SpO2 96 %.  PHYSICAL EXAM: General:  Well nourished, well developed, in no acute distress HEENT: normal Lymph: no adenopathy Neck: no JVD Endocrine:  No thryomegaly Vascular: No carotid bruits; FA pulses 2+ bilaterally without bruits Cardiac:  normal S1, S2; RRR; no murmur Lungs:  clear to auscultation bilaterally, no wheezing, rhonchi or rales Abd: soft, nontender, no hepatomegaly Ext: no edema Musculoskeletal:  No deformities, BUE and BLE strength normal and equal Skin: warm and dry Neuro:  CNs 2-12 intact, no focal abnormalities noted Psych:  Normal affect   EKG:   12 lead pending.  Telemetry with sinus bradycardia  Labs:   Lab Results  Component Value Date   WBC 9.3 06/24/2014   HGB 12.2 06/24/2014    HCT 36.0 06/24/2014   MCV 87.5 06/24/2014   PLT 279 06/24/2014    Recent Labs Lab 06/24/14 1931 06/24/14 1953  NA 140 142  K 5.3* 5.3*  CL 109 105  CO2 26  --   BUN 32* 34*  CREATININE 1.41* 1.50*  CALCIUM 9.1  --   GLUCOSE 142* 139*   Lab Results  Component Value Date   WBC 9.3 06/24/2014   HGB 12.2 06/24/2014   HCT 36.0 06/24/2014   MCV 87.5 06/24/2014   PLT 279 06/24/2014     No results for input(s): CKTOTAL, CKMB, TROPONINI in the last 72 hours. Lab Results  Component Value Date   CHOL 172 05/16/2014   HDL 45.20 05/16/2014   LDLCALC 89 05/16/2014   TRIG 187.0* 05/16/2014    Radiology/Studies:  No results found.   ASSESSMENT AND PLAN:   1. Symptomatic Bradycardia She appears to have developed sick sinus syndrome in the setting of afib (tachy/brady syndrome).  - Will hold metoprolol and diltiazem - check cardiac markers given history of RCA in-stent stenosis s/p balloon angioplasty. Cath one year ago with 60% mid RCA disease, FFR performed with the results of 0.86, no intervention done. Also with 75% ramus disease, 90% small OM disease. Medical management was recommended. - hopefully home tomorrow morning with close outpatient follow up if heart rate recovers off of metoprolol/dilt.  2. Renal insufficiency - This appears to be getting worse over past month. - ? Decreased cardiac output in setting of bradycardia? - hold lisinopril and lasix.  hold potassium supplements (K 5.3 on admission).  3. Paroxysmal AFib - Now off of amiodarone per patient (although last cardiology  outpatient note does not mention this being stopped) - continue apixaban.  4. IDDM - drop home lantus to 30u, cover with sliding scale.     Signed,  Yaakov Guthrie, MD 06/24/2014, 11:50 PM

## 2014-06-24 NOTE — ED Provider Notes (Signed)
CSN: [id number]     Arrival date & time 06/24/14  1917 History   First MD Initiated Contact with Patient 06/24/14 1921     Chief Complaint  Patient presents with  . Dizziness     (Consider location/radiation/quality/duration/timing/severity/associated sxs/prior Treatment) HPI  80 year old female past history as below notable for paroxysmal atrial flutter, CAD, stroke, diabetes who presents ED complaining of near-syncope this evening. Patient also reports having similar symptoms about a month and a half ago. States she was admitted and had workup and says she was never told what the cause was. Patient also notes being recently discontinued from her amiodarone. She states she has taken all of her other medications as instructed. She denies any chest pain, headache, weakness. Dizziness is reported as lightheadedness and is made worse with standing. Patient denies any nausea, vomiting, diarrhea, fevers, chills, chest pain, short of breath or other symptoms currently. No other complaints at this time.   Past Medical History  Diagnosis Date  . DM (diabetes mellitus)   . Paroxysmal atrial flutter   . Dyslipidemia   . CAD (coronary artery disease)     a. s/p prior PCI RCA with ISR req CBA;  b. 04/2013 Neg MV;  c. 05/2013 Cath: LM 20, LAD 30p, LCX 20p, OM1 90 small, RCA 25m, PDA 40-->Med Rx.  . Osteoarthritis   . Hypothyroidism     hx  . Rheumatoid arthritis(714.0)   . Obesity   . GERD (gastroesophageal reflux disease)   . Depression   . Shoulder fracture, left     Dr. Martha Clan  . Cerebral infarction    Past Surgical History  Procedure Laterality Date  . Rca stent placement  2000  . Restenosis with ptca placement  2005  . Cataract surgery    . Partial hysterectomy    . Replacement total knee  2013    right  . Shoulder surgery  02/04/13  . Cardiac catheterization  06/11/2013  . Coronary angioplasty     Family History  Problem Relation Age of Onset  . Esophageal cancer Mother   .  Diabetes Mother   . Cancer Mother     esophageal    History  Substance Use Topics  . Smoking status: Never Smoker   . Smokeless tobacco: Never Used     Comment: former passive smoker  . Alcohol Use: No   OB History    No data available     Review of Systems  Constitutional: Negative for fever and chills.  HENT: Negative for congestion, rhinorrhea and sore throat.   Eyes: Negative for visual disturbance.  Respiratory: Negative for cough and shortness of breath.   Cardiovascular: Negative for chest pain, palpitations and leg swelling.  Gastrointestinal: Negative for nausea, vomiting, abdominal pain, diarrhea and constipation.  Genitourinary: Negative for dysuria, hematuria, vaginal bleeding and vaginal discharge.  Musculoskeletal: Negative for back pain and neck pain.  Skin: Negative for rash.  Neurological: Positive for dizziness and syncope (near). Negative for seizures, facial asymmetry, speech difficulty, weakness, light-headedness, numbness and headaches.  All other systems reviewed and are negative.     Allergies  Oxycodone  Home Medications   Prior to Admission medications   Medication Sig Start Date End Date Taking? Authorizing Provider  albuterol (PROVENTIL HFA;VENTOLIN HFA) 108 (90 BASE) MCG/ACT inhaler Inhale 2 puffs into the lungs every 6 (six) hours as needed for wheezing or shortness of breath. 06/28/13   Joaquim Nam, MD  amiodarone (PACERONE) 200 MG tablet Take 1 tablet (  200 mg total) by mouth 2 (two) times daily. 03/15/14   Antonieta Iba, MD  apixaban (ELIQUIS) 5 MG TABS tablet Take 1 tablet (5 mg total) by mouth 2 (two) times daily. 05/25/14   Shelia Media, MD  atorvastatin (LIPITOR) 20 MG tablet Take 1 tablet (20 mg total) by mouth daily. 10/27/13   Esperanza Sheets, MD  cyclobenzaprine (FLEXERIL) 5 MG tablet Take 1 tablet (5 mg total) by mouth 3 (three) times daily as needed. 05/16/14   Shelia Media, MD  diltiazem (CARDIZEM CD) 120 MG 24 hr  capsule Take 1 capsule (120 mg total) by mouth daily. 05/27/14   Shelia Media, MD  diltiazem (CARDIZEM CD) 120 MG 24 hr capsule TAKE 1 CAPSULE (120 MG TOTAL) BY MOUTH DAILY. 06/24/14   Shelia Media, MD  ferrous sulfate 325 (65 FE) MG tablet Take 325 mg by mouth daily with breakfast.    Historical Provider, MD  fluticasone (FLONASE) 50 MCG/ACT nasal spray Place 2 sprays into the nose daily. 06/12/12   Shelia Media, MD  furosemide (LASIX) 40 MG tablet Take 1 tablet (40 mg total) by mouth 2 (two) times daily. 07/01/13   Shelia Media, MD  gabapentin (NEURONTIN) 300 MG capsule Take 1 capsule (300 mg total) by mouth 3 (three) times daily. 08/04/13   Shelia Media, MD  insulin aspart (NOVOLOG) 100 UNIT/ML injection Before each meal 3 times a day, 140-199 - 2 units, 200-250 - 4 units, 251-299 - 6 units,  300-349 - 8 units,  350 or above 10 units. Insulin PEN if approved, provide syringes and needles if needed. 03/25/14   Shelia Media, MD  insulin glargine (LANTUS) 100 UNIT/ML injection Inject 0.5 mLs (50 Units total) into the skin at bedtime. 03/25/14 05/19/16  Shelia Media, MD  latanoprost (XALATAN) 0.005 % ophthalmic solution Place 1 drop into both eyes at bedtime.  04/06/12   Historical Provider, MD  levothyroxine (LEVOTHROID) 150 MCG tablet Take 1 tablet (150 mcg total) by mouth daily. 07/01/13 07/01/14  Shelia Media, MD  lisinopril (PRINIVIL,ZESTRIL) 10 MG tablet Take 1 tablet (10 mg total) by mouth daily. 09/27/13   Shelia Media, MD  meclizine (ANTIVERT) 25 MG tablet Take 1 tablet (25 mg total) by mouth 2 (two) times daily. As needed for dizziness or nausea 10/19/13   Ok Anis, NP  metFORMIN (GLUCOPHAGE) 500 MG tablet Take 1 tablet (500 mg total) by mouth 2 (two) times daily. 11/17/13   Shelia Media, MD  metoprolol tartrate (LOPRESSOR) 25 MG tablet Take 1 tablet (25 mg total) by mouth 2 (two) times daily as needed. 10/27/13 10/27/14  Esperanza Sheets, MD   nitroGLYCERIN (NITROSTAT) 0.4 MG SL tablet Place 1 tablet (0.4 mg total) under the tongue every 5 (five) minutes as needed. 06/16/13   Raquel Conni Elliot, NP  omeprazole (PRILOSEC) 20 MG capsule Take 1 capsule (20 mg total) by mouth 2 (two) times daily. 01/10/14 05/16/15  Shelia Media, MD  potassium chloride SA (K-DUR,KLOR-CON) 20 MEQ tablet Take 1 tablet (20 mEq total) by mouth 2 (two) times daily. 06/12/12   Shelia Media, MD  sertraline (ZOLOFT) 50 MG tablet Take 1 tablet (50 mg total) by mouth daily. 05/27/14   Shelia Media, MD  traMADol (ULTRAM) 50 MG tablet Take 2 tablets (100 mg total) by mouth every 8 (eight) hours as needed for moderate pain. 03/25/14   Shelia Media, MD  BP 152/46 mmHg  Pulse 42  Temp(Src) 98.9 F (37.2 C) (Oral)  Resp 14  Ht 5\' 3"  (1.6 m)  Wt 200 lb (90.719 kg)  BMI 35.44 kg/m2  SpO2 95% Physical Exam  Constitutional: She is oriented to person, place, and time. She appears well-developed and well-nourished. No distress.  HENT:  Head: Normocephalic and atraumatic.  Eyes: Conjunctivae are normal.  Neck: Normal range of motion.  Cardiovascular: Regular rhythm, normal heart sounds and intact distal pulses.  Bradycardia present.   No murmur heard. Pulmonary/Chest: Effort normal and breath sounds normal. No respiratory distress. She has no wheezes. She has no rales. She exhibits no tenderness.  Abdominal: Soft. Bowel sounds are normal. She exhibits no distension.  Musculoskeletal: Normal range of motion.  Neurological: She is alert and oriented to person, place, and time. No cranial nerve deficit.  HDS, AAOx4. PERRL, EOMI, TML, face sym. CN 2-12 grossly intact. 5/5 sym, no drift, SILT, normal coordination. Gait testing deferred.    Skin: Skin is warm and dry.  Psychiatric: She has a normal mood and affect.  Nursing note and vitals reviewed.   ED Course  Procedures (including critical care time) Labs Review Labs Reviewed  CBC - Abnormal; Notable  for the following:    RBC 3.84 (*)    Hemoglobin 11.1 (*)    HCT 33.6 (*)    RDW 15.8 (*)    All other components within normal limits  BASIC METABOLIC PANEL - Abnormal; Notable for the following:    Potassium 5.3 (*)    Glucose, Bld 142 (*)    BUN 32 (*)    Creatinine, Ser 1.41 (*)    GFR calc non Af Amer 35 (*)    GFR calc Af Amer 40 (*)    All other components within normal limits  I-STAT CHEM 8, ED - Abnormal; Notable for the following:    Potassium 5.3 (*)    BUN 34 (*)    Creatinine, Ser 1.50 (*)    Glucose, Bld 139 (*)    All other components within normal limits  BASIC METABOLIC PANEL  CBG MONITORING, ED    Imaging Review No results found.   EKG Interpretation None      MDM   Final diagnoses:  None    [name] [name] is a 80 y.o. female with H&P as above. Patient presents for evaluation of near-syncope. Patient is hemodynamically stable and in no apparent distress on arrival but with heart rate in the 30s to 40s. EKG shows sinus bradycardia. No signs of heart block, ischemia. No signs of difficulty WPW, HOCM, long QT, Brugada. I suspect this is likely symptomatically bradycardia for medication. I have consult to cardiology for further recommendations. Cardiology will admit patient for symptom bradycardia.  Pt seen in conjunction with Dr. Golden Circle, DO [facility] Emergency Medicine Resident - PGY-2    [name] Dura, MD 06/24/14 0383  [name] Skeens, MD 06/25/14 [phone]

## 2014-06-24 NOTE — Telephone Encounter (Signed)
[facility] [place] [facility] - [place] TELEPHONE ADVICE RECORD [facility]  Patient Name: [name] [name]  DOB: Aug 21, 1934    Initial Comment Caller states she is feeling dizzy and weak. Her pulse was 48 two hours ago. Call second # if call back is more than 25 minutes, home #.   Nurse Assessment  Nurse: Phylliss Bob, RN, Synetta Fail Date/Time (Eastern Time): 06/24/2014 5:41:40 PM  Confirm and document reason for call. If symptomatic, describe symptoms. ---Caller states she is feeling dizzy and weak. Her pulse was 48 two hours ago. Call second # if call back is more than 25 minutes, home #. caller stated that she has dizziness and has the spinning sensation of the room room and has taken an antivert at present and no history of vertigo and heart rate at present  Has the patient traveled out of the country within the last 30 days? ---No  Does the patient require triage? ---Yes  Related visit to physician within the last 2 weeks? ---No  Does the PT have any chronic conditions? (i.e. diabetes, asthma, etc.) ---Yes  List chronic conditions. ---diabetes type Ii asthma HTN heart disease a fib     Guidelines    Guideline Title Affirmed Question Affirmed Notes  Dizziness - Lightheadedness Heart beating < 50 beats per minute OR > 140 beats per minute    Final Disposition User   Call EMS 911 Now Phylliss Bob, RN, Synetta Fail

## 2014-06-25 ENCOUNTER — Encounter (HOSPITAL_COMMUNITY): Payer: Self-pay | Admitting: Nurse Practitioner

## 2014-06-25 DIAGNOSIS — I480 Paroxysmal atrial fibrillation: Secondary | ICD-10-CM

## 2014-06-25 DIAGNOSIS — I495 Sick sinus syndrome: Secondary | ICD-10-CM

## 2014-06-25 DIAGNOSIS — N183 Chronic kidney disease, stage 3 unspecified: Secondary | ICD-10-CM | POA: Diagnosis present

## 2014-06-25 DIAGNOSIS — R55 Syncope and collapse: Secondary | ICD-10-CM | POA: Diagnosis not present

## 2014-06-25 DIAGNOSIS — N179 Acute kidney failure, unspecified: Secondary | ICD-10-CM

## 2014-06-25 DIAGNOSIS — E119 Type 2 diabetes mellitus without complications: Secondary | ICD-10-CM | POA: Diagnosis not present

## 2014-06-25 DIAGNOSIS — R001 Bradycardia, unspecified: Secondary | ICD-10-CM | POA: Diagnosis not present

## 2014-06-25 DIAGNOSIS — E785 Hyperlipidemia, unspecified: Secondary | ICD-10-CM | POA: Diagnosis not present

## 2014-06-25 LAB — URINALYSIS, ROUTINE W REFLEX MICROSCOPIC
BILIRUBIN URINE: NEGATIVE
Glucose, UA: 100 mg/dL — AB
Ketones, ur: NEGATIVE mg/dL
NITRITE: NEGATIVE
PROTEIN: 30 mg/dL — AB
Specific Gravity, Urine: 1.013 (ref 1.005–1.030)
UROBILINOGEN UA: 0.2 mg/dL (ref 0.0–1.0)
pH: 5 (ref 5.0–8.0)

## 2014-06-25 LAB — BASIC METABOLIC PANEL
Anion gap: 6 (ref 5–15)
BUN: 28 mg/dL — ABNORMAL HIGH (ref 6–23)
CHLORIDE: 106 mmol/L (ref 96–112)
CO2: 27 mmol/L (ref 19–32)
Calcium: 8.8 mg/dL (ref 8.4–10.5)
Creatinine, Ser: 1.26 mg/dL — ABNORMAL HIGH (ref 0.50–1.10)
GFR calc Af Amer: 46 mL/min — ABNORMAL LOW (ref >90)
GFR calc non Af Amer: 40 mL/min — ABNORMAL LOW (ref >90)
GLUCOSE: 176 mg/dL — AB (ref 70–99)
Potassium: 4.2 mmol/L (ref 3.5–5.1)
Sodium: 139 mmol/L (ref 135–145)

## 2014-06-25 LAB — URINE MICROSCOPIC-ADD ON

## 2014-06-25 LAB — GLUCOSE, CAPILLARY
GLUCOSE-CAPILLARY: 246 mg/dL — AB (ref 70–99)
GLUCOSE-CAPILLARY: 212 mg/dL — AB (ref 70–99)
GLUCOSE-CAPILLARY: 226 mg/dL — AB (ref 70–99)
Glucose-Capillary: 146 mg/dL — ABNORMAL HIGH (ref 70–99)
Glucose-Capillary: 236 mg/dL — ABNORMAL HIGH (ref 70–99)

## 2014-06-25 LAB — TROPONIN I

## 2014-06-25 MED ORDER — ALBUTEROL SULFATE (2.5 MG/3ML) 0.083% IN NEBU: 2.5000 mg | INHALATION_SOLUTION | Freq: Four times a day (QID) | RESPIRATORY_TRACT | Status: DC | PRN

## 2014-06-25 NOTE — Progress Notes (Signed)
Pt ambualted in hallway upon beginning HR 52, after approx 65ft HR up to 107 pt became SOB and stopped for a minuter to rest. HR down to 64 which is where it remained for the rest of ambulating for total approx 106ft

## 2014-06-25 NOTE — Progress Notes (Signed)
UR completed 

## 2014-06-25 NOTE — Progress Notes (Signed)
Patient Name: [name] [name] Date of Encounter: 06/25/2014   Principal Problem:   Pre-syncope Active Problems:   Bradycardia   CAD (coronary artery disease)   Atrial fibrillation   Hypertension   Chronic UTI   Hyperlipidemia   Diabetes mellitus type 2, uncontrolled   Depression   Hypothyroidism   GERD (gastroesophageal reflux disease)   CVA (cerebral infarction)   CKD (chronic kidney disease), stage III   SUBJECTIVE  No further lightheadedness.  Tired.  No c/p or sob.  HR's 40's to low 50's overnight.  Hasn't ambulated.  CURRENT MEDS . apixaban  5 mg Oral BID  . atorvastatin  20 mg Oral Daily  . ferrous sulfate  325 mg Oral Q breakfast  . gabapentin  300 mg Oral TID  . insulin aspart  0-15 Units Subcutaneous TID WC  . insulin aspart  0-5 Units Subcutaneous QHS  . insulin glargine  30 Units Subcutaneous QHS  . latanoprost  1 drop Both Eyes QHS  . levothyroxine  150 mcg Oral QAC breakfast  . pantoprazole  40 mg Oral Daily    OBJECTIVE  Filed Vitals:   06/24/14 2345 06/25/14 0040 06/25/14 0400 06/25/14 0900  BP: 135/50 141/51 126/86 121/52  Pulse: 49 49 43 47  Temp:  98 F (36.7 C) 97.6 F (36.4 C) 98.4 F (36.9 C)  TempSrc:  Oral Oral Oral  Resp: 15   16  Height:  5\' 3"  (1.6 m)    Weight:  230 lb (104.327 kg)    SpO2: 97% 97% 98% 99%    Intake/Output Summary (Last 24 hours) at 06/25/14 1143 Last data filed at 06/25/14 1100  Gross per 24 hour  Intake    360 ml  Output   1200 ml  Net   -840 ml   Filed Weights   06/24/14 1922 06/25/14 0040  Weight: 200 lb (90.719 kg) 230 lb (104.327 kg)    PHYSICAL EXAM  General: Pleasant, NAD. Neuro: Alert and oriented X 3. Moves all extremities spontaneously. Psych: Flat affect. HEENT:  Normal  Neck: Supple without bruits.  Difficult to assess jvp 2/2 girth. Lungs:  Resp regular and unlabored, CTA. Heart: RRR, brady, no s3, s4, or murmurs. Abdomen: Soft, non-tender, non-distended, BS + x 4.  Extremities: No  clubbing, cyanosis or edema. DP/PT/Radials 2+ and equal bilaterally.  Accessory Clinical Findings  CBC  Recent Labs  06/24/14 1931 06/24/14 1953  WBC 9.3  --   HGB 11.1* 12.2  HCT 33.6* 36.0  MCV 87.5  --   PLT 279  --    Basic Metabolic Panel  Recent Labs  06/24/14 1931 06/24/14 1953 06/25/14 0140  NA 140 142 139  K 5.3* 5.3* 4.2  CL 109 105 106  CO2 26  --  27  GLUCOSE 142* 139* 176*  BUN 32* 34* 28*  CREATININE 1.41* 1.50* 1.26*  CALCIUM 9.1  --  8.8   Cardiac Enzymes  Recent Labs  06/25/14 0140  TROPONINI <0.03   TELE  Sinus brady - low 40's to low 50's.  ECG  Sb, 43, lat st/t changes  Radiology/Studies  No results found.  ASSESSMENT AND PLAN  1.  Presyncope/Sinus Bradycardia:  Pt presented last night with a 2 day h/o intermittent lightheadedness while standing.  She was found to be bradycardic with rates in the 40's in the ED.  She was mildly hyperkalemic (5.2) on admission and also prerenal.  K/Creat better this AM.  Home doses of bb and  dilt are on hold (took yesterday).  Rates remain mostly in the 40's this AM.  No evidence of high grade heart block or pauses.  Cont to hold bb/ccb.  Ambulate to assess for chronotropic competence.   2.  PAF/ ? Tachy-brady: s/p dccv in November.  She was on amio 200 mg daily as an outpt but it appears that she misunderstood her PCP following 05/25/2014 visit and stopped taking it at that time.  No recurrent afib to her knowledge.  In the setting of above, tachy-brady syndrome is of concern and ultimately may require ep eval.  3.  CAD:  S/p prior RCA stenting with nonobs dzs on most recent cath in 2015.  No chest pain.  Trop neg.  Cont statin.  BB d/c'd as above.  4.  HTN:  Stable.  5.  HL:  Cont statin.  LDL 89 in December. NL LFT's in January.  6.  H/O GIB: BRBPR in late Dec while on eliquis.  Had GI w/u revealing multiple diverticuli but no acute bleeding.  Eliquis resumed by pcp 05/25/14.  No recurrent brbpr or  melena.  H/H stable.    7.  CKD III:  With mild renal insuff and hyperK+, likely prerenal azotemia, on admission.  Improved this am.  Lasix and acei currently on hold.  8.  H/O UTI's: pt reports foul smelling urine and right flank pain.  She has a h/o recurrent uti's. UA sent.  Signed, [name] Ducking NP  Cardiology/EP Attending  Patient seen and examined. Agree with above. Her heart rate is improving off of her AV nodal blocking drugs. Will monitor today and hopefully be able to discharge tomorrow. I think restarting 200 mg a day of amio tomorrow, and stopping all AV nodal blocking drugs is reasonable as an outpatient.   [name] [name].

## 2014-06-26 ENCOUNTER — Encounter (HOSPITAL_COMMUNITY): Payer: Self-pay | Admitting: Nurse Practitioner

## 2014-06-26 LAB — BASIC METABOLIC PANEL
Anion gap: 4 — ABNORMAL LOW (ref 5–15)
BUN: 18 mg/dL (ref 6–23)
CALCIUM: 8.8 mg/dL (ref 8.4–10.5)
CO2: 28 mmol/L (ref 19–32)
Chloride: 108 mmol/L (ref 96–112)
Creatinine, Ser: 1.09 mg/dL (ref 0.50–1.10)
GFR calc Af Amer: 55 mL/min — ABNORMAL LOW (ref >90)
GFR calc non Af Amer: 48 mL/min — ABNORMAL LOW (ref >90)
Glucose, Bld: 204 mg/dL — ABNORMAL HIGH (ref 70–99)
POTASSIUM: 4.4 mmol/L (ref 3.5–5.1)
SODIUM: 140 mmol/L (ref 135–145)

## 2014-06-26 LAB — GLUCOSE, CAPILLARY: Glucose-Capillary: 171 mg/dL — ABNORMAL HIGH (ref 70–99)

## 2014-06-26 MED ORDER — FUROSEMIDE 40 MG PO TABS: 40.0000 mg | ORAL_TABLET | Freq: Every day | ORAL | Status: DC

## 2014-06-26 MED ORDER — POTASSIUM CHLORIDE CRYS ER 20 MEQ PO TBCR: 20.0000 meq | EXTENDED_RELEASE_TABLET | Freq: Every day | ORAL | Status: DC

## 2014-06-26 MED ORDER — SULFAMETHOXAZOLE-TRIMETHOPRIM 800-160 MG PO TABS: 1.0000 | ORAL_TABLET | Freq: Two times a day (BID) | ORAL | Status: DC

## 2014-06-26 MED ORDER — SULFAMETHOXAZOLE-TRIMETHOPRIM 800-160 MG PO TABS
1.0000 | ORAL_TABLET | Freq: Two times a day (BID) | ORAL | Status: DC
  Administered 2014-06-26: 1 via ORAL
  Filled 2014-06-26 (×2): qty 1

## 2014-06-26 MED ORDER — AMIODARONE HCL 100 MG PO TABS: 100.0000 mg | ORAL_TABLET | Freq: Every day | ORAL | Status: DC

## 2014-06-26 NOTE — Progress Notes (Signed)
Patient Name: [name] [name] Date of Encounter: 06/26/2014   Principal Problem:   Pre-syncope Active Problems:   Bradycardia   CAD (coronary artery disease)   Atrial fibrillation   Hypertension   Chronic UTI   Hyperlipidemia   Diabetes mellitus type 2, uncontrolled   Depression   Hypothyroidism   GERD (gastroesophageal reflux disease)   CVA (cerebral infarction)   CKD (chronic kidney disease), stage III    SUBJECTIVE  Ambulated yesterday with appropriate rise in heart rate.  No complaints this AM.  Eager to go home.  CURRENT MEDS . apixaban  5 mg Oral BID  . atorvastatin  20 mg Oral Daily  . ferrous sulfate  325 mg Oral Q breakfast  . gabapentin  300 mg Oral TID  . insulin aspart  0-15 Units Subcutaneous TID WC  . insulin aspart  0-5 Units Subcutaneous QHS  . insulin glargine  30 Units Subcutaneous QHS  . latanoprost  1 drop Both Eyes QHS  . levothyroxine  150 mcg Oral QAC breakfast  . pantoprazole  40 mg Oral Daily    OBJECTIVE  Filed Vitals:   06/25/14 0900 06/25/14 2000 06/26/14 0000 06/26/14 0400  BP: 121/52 131/37 145/38 160/44  Pulse: 47 46 59 55  Temp: 98.4 F (36.9 C) 97.8 F (36.6 C) 98.2 F (36.8 C) 98.5 F (36.9 C)  TempSrc: Oral Oral Oral Oral  Resp: 16 18 18 20   Height:      Weight:    232 lb (105.235 kg)  SpO2: 99% 95% 95% 92%    Intake/Output Summary (Last 24 hours) at 06/26/14 0740 Last data filed at 06/25/14 1739  Gross per 24 hour  Intake    840 ml  Output   1300 ml  Net   -460 ml   Filed Weights   06/24/14 1922 06/25/14 0040 06/26/14 0400  Weight: 200 lb (90.719 kg) 230 lb (104.327 kg) 232 lb (105.235 kg)    PHYSICAL EXAM  General: Pleasant, NAD. Neuro: Alert and oriented X 3. Moves all extremities spontaneously. Psych: Flat affect. HEENT: Normal Neck: Supple without bruits. Difficult to assess jvp 2/2 girth. Lungs: Resp regular and unlabored, CTA. Heart: RRR, brady, no s3, s4, or murmurs. Abdomen: Soft,  non-tender, non-distended, BS + x 4.  Extremities: No clubbing, cyanosis or edema. DP/PT/Radials 2+ and equal bilaterally.  Accessory Clinical Findings  CBC  Recent Labs  06/24/14 1931 06/24/14 1953  WBC 9.3  --   HGB 11.1* 12.2  HCT 33.6* 36.0  MCV 87.5  --   PLT 279  --    Basic Metabolic Panel  Recent Labs  06/25/14 0140 06/26/14 0400  NA 139 140  K 4.2 4.4  CL 106 108  CO2 27 28  GLUCOSE 176* 204*  BUN 28* 18  CREATININE 1.26* 1.09  CALCIUM 8.8 8.8   Cardiac Enzymes  Recent Labs  06/25/14 0140  TROPONINI <0.03   TELE  Sinus brady - sinus rhythm, 50's to 60's.  Radiology/Studies  No results found.  ASSESSMENT AND PLAN  1. Presyncope/Sinus Bradycardia: Pt presented on 2/5 with a 2 day h/o intermittent lightheadedness while standing. She was found to be bradycardic with rates in the 40's in the ED. She was mildly hyperkalemic (5.2) on admission and also prerenal. K/Creat stable.  Bb/dilt d/c'd.  Walked yesterday with rates initially rising to low 100's then settling down into the 60's.  Rates stable overnight w/o evidence of high grade HB or significant pauses.  Plan  d/c today off of bb and dilt.  Plan to resume amio in setting of PAF - will discuss timing w/ Dr. Ladona Ridgel.  2. PAF/ ? Tachy-brady: s/p dccv in November. She was on amio 200 mg daily as an outpt but it appears that she misunderstood her PCP following 05/25/2014 visit and stopped taking it at that time. No recurrent afib to her knowledge. In the setting of above, tachy-brady syndrome is of concern and ultimately may require ep eval.  Plan to resume amio as outpt but cont to avoid bb/ccb.  3. CAD: S/p prior RCA stenting with nonobs dzs on most recent cath in 2015. No chest pain. Trop neg. Cont statin. BB d/c'd as above.  4. HTN: BP trending up in setting of being off all antihypertensives (bb/dilt/acei/lasix).  Plan to resume acei/lasix.  5. HL: Cont statin. LDL 89 in December. NL  LFT's in January.  6. H/O GIB: BRBPR in late Dec while on eliquis. Had GI w/u revealing multiple diverticuli but no acute bleeding. Eliquis resumed by pcp 05/25/14. No recurrent brbpr or melena. H/H stable.   7. CKD III: With mild renal insuff and hyperK+, likely prerenal azotemia, on admission. Stable this am. Lasix and acei currently on hold.  Plan to resume on d/c but reduce lasix to 40mg  daily and kdur to daily.  Plan f/u bmet in a week.  8. UTI's: H/o chronic recurrent UTI's.  UA yesterday cloudy with many bacteria, small leukocytes, nitrate neg.  She's had foul smelling urine and mild right flank pain.  Says that cipro has not been helpful in the past.  Culture pending.  Will start tmp-smx ds.  Signed, NP  EP Attending  Patient seen and examined. Will restart low dose amiodarone.  Nicolasa Ducking.D.

## 2014-06-26 NOTE — Discharge Instructions (Signed)
***  PLEASE REMEMBER TO BRING ALL OF YOUR MEDICATIONS TO EACH OF YOUR FOLLOW-UP OFFICE VISITS.  

## 2014-06-26 NOTE — Progress Notes (Signed)
Utilization Review Completed.   Nesha Counihan, RN, BSN Nurse Case Manager  

## 2014-06-26 NOTE — Discharge Summary (Addendum)
Discharge Summary   Patient ID: [name] [name],  MRN: [id number], DOB/AGE: 1934-07-08 80 y.o.  Admit date: 06/24/2014 Discharge date: 06/26/2014  Primary Care Provider: Ronna Polio AZBELL Primary Cardiologist: Concha Se, MD   Discharge Diagnoses Principal Problem:   Pre-syncope  **In setting of sinus bradycardia with rates in the 40's.  **Metoprolol and Diltiazem discontinued.  **Normal chronotropic response with ambulation.  Active Problems:   Bradycardia  **No evidence of high grade heart block or symptomatic pauses.   CAD (coronary artery disease)   Paroxysmal Atrial fibrillation  **Amiodarone resumed at 100 mg daily.   Hypertension   Chronic UTI   Hyperlipidemia   Diabetes mellitus type 2, uncontrolled   Depression   Hypothyroidism   GERD (gastroesophageal reflux disease)   CVA (cerebral infarction)   Acute on chronic kidney disease, stage III  **Creat 1.50 on admission 1.09 on d/c.  Allergies Allergies  Allergen Reactions  . Oxycodone Nausea And Vomiting   Procedures  None  History of Present Illness  80 y/o female with a h/o CAD and paroxysmal atrial fibrillation s/p DCCV in 03/2014 who presented to the [facility] ED on 2/5 with a 2 day h/o intermittent lightheadedness and presyncope.  She was found to be in sinus bradycardia with rates in the 40's but without evidence of heart block.  Her creatinine was elevated @ 1.50 with a potassium of 5.3.  She was admitted for further evaluation.  Hospital Course  Upon admission, her home doses of metoprolol and diltiazem were placed on hold. Further, home doses of lisinopril and lasix were also held in the setting of prerenal azotemia.  With holding of medications and gentle hydration, creatinine normalized and mild hyperkalemia corrected.  Her heart rates improved throughout the day on 2/6 and rose appropriately with ambulation into the 60's.  With improved heart rates and stable blood pressures, she has had no recurrence  of presyncope.  Given h/o PAF and what appears to be a propensity for tachy brady syndrome in the future, preventing recurrent afib will be important.  To that end, we have resumed her amiodarone (she stopped this on her own in early January) at 100 mg daily.  Ms. Lefler also has a h/o chronic recurrent UTI's.  She c/o a several day history of foul smelling urine and mild right flank discomfort.  UA was sent off and revealed cloudy urine with many bacteria but negative nitrite.  We initiated TMP-SMX therapy and urine culture is currently pending.   Discharge Vitals Blood pressure 148/53, pulse 55, temperature 98 F (36.7 C), temperature source Oral, resp. rate 18, height 5\' 3"  (1.6 m), weight 232 lb (105.235 kg), SpO2 95 %.  Filed Weights   06/24/14 1922 06/25/14 0040 06/26/14 0400  Weight: 200 lb (90.719 kg) 230 lb (104.327 kg) 232 lb (105.235 kg)    Labs  CBC  Recent Labs  06/24/14 1931 06/24/14 1953  WBC 9.3  --   HGB 11.1* 12.2  HCT 33.6* 36.0  MCV 87.5  --   PLT 279  --    Basic Metabolic Panel  Recent Labs  06/25/14 0140 06/26/14 0400  NA 139 140  K 4.2 4.4  CL 106 108  CO2 27 28  GLUCOSE 176* 204*  BUN 28* 18  CREATININE 1.26* 1.09  CALCIUM 8.8 8.8   Liver Function Tests Lab Results  Component Value Date   ALT 16 05/25/2014   AST 17 05/25/2014   ALKPHOS 88 05/25/2014   BILITOT 0.6 05/25/2014  Cardiac Enzymes  Recent Labs  06/25/14 0140  TROPONINI <0.03   Disposition  Pt is being discharged home today in good condition.  Follow-up Plans & Appointments      Follow-up Information    Follow up with [name], MD On 06/27/2014.   Specialty:  Internal Medicine   Why:  1:30 AM   Contact information:   [address] [place] [address] [phone]       Follow up with Julien Nordmann, MD In 1 week.   Specialty:  Cardiology   Why:  we will arrange for follow-up and contact you.   Contact information:   [address] [place] [address] [phone]       Discharge Medications    Medication List    STOP taking these medications        diltiazem 120 MG 24 hr capsule  Commonly known as:  CARDIZEM CD     metoprolol tartrate 25 MG tablet  Commonly known as:  LOPRESSOR      TAKE these medications        albuterol 108 (90 BASE) MCG/ACT inhaler  Commonly known as:  PROVENTIL HFA;VENTOLIN HFA  Inhale 2 puffs into the lungs every 6 (six) hours as needed for wheezing or shortness of breath.     amiodarone 100 MG tablet  Commonly known as:  PACERONE  Take 1 tablet (100 mg total) by mouth daily.     apixaban 5 MG Tabs tablet  Commonly known as:  ELIQUIS  Take 1 tablet (5 mg total) by mouth 2 (two) times daily.     atorvastatin 20 MG tablet  Commonly known as:  LIPITOR  Take 1 tablet (20 mg total) by mouth daily.     cyclobenzaprine 5 MG tablet  Commonly known as:  FLEXERIL  Take 1 tablet (5 mg total) by mouth 3 (three) times daily as needed.     ferrous sulfate 325 (65 FE) MG tablet  Take 325 mg by mouth daily with breakfast.     fluticasone 50 MCG/ACT nasal spray  Commonly known as:  FLONASE  Place 2 sprays into the nose daily.     furosemide 40 MG tablet  Commonly known as:  LASIX  Take 1 tablet (40 mg total) by mouth daily.     gabapentin 300 MG capsule  Commonly known as:  NEURONTIN  Take 1 capsule (300 mg total) by mouth 3 (three) times daily.     insulin aspart 100 UNIT/ML injection  Commonly known as:  novoLOG  - Before each meal 3 times a day, 140-199 - 2 units, 200-250 - 4 units, 251-299 - 6 units,  300-349 - 8 units,  350 or above 10 units.  - Insulin PEN if approved, provide syringes and needles if needed.     insulin glargine 100 UNIT/ML injection  Commonly known as:  LANTUS  Inject 0.5 mLs (50 Units total) into the skin at bedtime.     latanoprost 0.005 % ophthalmic solution  Commonly known as:  XALATAN  Place 1 drop into both eyes at bedtime.      levothyroxine 150 MCG tablet  Commonly known as:  LEVOTHROID  Take 1 tablet (150 mcg total) by mouth daily.     lisinopril 10 MG tablet  Commonly known as:  PRINIVIL,ZESTRIL  Take 1 tablet (10 mg total) by mouth daily.     meclizine 25 MG tablet  Commonly known as:  ANTIVERT  Take 1 tablet (  25 mg total) by mouth 2 (two) times daily. As needed for dizziness or nausea     metFORMIN 500 MG tablet  Commonly known as:  GLUCOPHAGE  Take 1 tablet (500 mg total) by mouth 2 (two) times daily.     nitroGLYCERIN 0.4 MG SL tablet  Commonly known as:  NITROSTAT  Place 1 tablet (0.4 mg total) under the tongue every 5 (five) minutes as needed.     omeprazole 20 MG capsule  Commonly known as:  PRILOSEC  Take 1 capsule (20 mg total) by mouth 2 (two) times daily.     potassium chloride SA 20 MEQ tablet  Commonly known as:  K-DUR,KLOR-CON  Take 1 tablet (20 mEq total) by mouth daily.     sertraline 50 MG tablet  Commonly known as:  ZOLOFT  Take 1 tablet (50 mg total) by mouth daily.     sulfamethoxazole-trimethoprim 800-160 MG per tablet  Commonly known as:  BACTRIM DS,SEPTRA DS  Take 1 tablet by mouth every 12 (twelve) hours.     traMADol 50 MG tablet  Commonly known as:  ULTRAM  Take 2 tablets (100 mg total) by mouth every 8 (eight) hours as needed for moderate pain.      I would like to go home on amiodarone 100 mg daily.  Outstanding Labs/Studies  Follow-up bmet w/in 1 week.  Duration of Discharge Encounter   Greater than 30 minutes including physician time.  Signed, Nicolasa Ducking NP 06/26/2014, 9:59 AM  EP Attending Patient seen and examined. Agree with the above findings. She is overall better. I am concerned about the development of recurrent atrial fibrillation. I've asked the patient to take 100 mg of amiodarone daily in hopes of preventing recurrent atrial fibrillation, but avoiding symptomatic sinus bradycardia. She is instructed to stop taking her beta blocker  and her calcium channel blocker. She will follow-up with our [facility] team in a week.  Lewayne Bunting, M.D.

## 2014-06-27 ENCOUNTER — Ambulatory Visit (INDEPENDENT_AMBULATORY_CARE_PROVIDER_SITE_OTHER): Payer: Commercial Managed Care - HMO | Admitting: Internal Medicine

## 2014-06-27 ENCOUNTER — Other Ambulatory Visit: Payer: Self-pay

## 2014-06-27 ENCOUNTER — Encounter: Payer: Self-pay | Admitting: Internal Medicine

## 2014-06-27 VITALS — BP 107/71 | HR 71 | Temp 98.1°F | Ht 63.0 in | Wt 230.2 lb

## 2014-06-27 DIAGNOSIS — R001 Bradycardia, unspecified: Secondary | ICD-10-CM

## 2014-06-27 DIAGNOSIS — L309 Dermatitis, unspecified: Secondary | ICD-10-CM

## 2014-06-27 DIAGNOSIS — R55 Syncope and collapse: Secondary | ICD-10-CM

## 2014-06-27 DIAGNOSIS — N390 Urinary tract infection, site not specified: Secondary | ICD-10-CM

## 2014-06-27 DIAGNOSIS — I158 Other secondary hypertension: Secondary | ICD-10-CM

## 2014-06-27 DIAGNOSIS — I480 Paroxysmal atrial fibrillation: Secondary | ICD-10-CM

## 2014-06-27 LAB — GLUCOSE, CAPILLARY: Glucose-Capillary: 142 mg/dL — ABNORMAL HIGH (ref 70–99)

## 2014-06-27 NOTE — Progress Notes (Signed)
Subjective:    Patient ID: [name] [name], female    DOB: Apr 25, 1935, 80 y.o.   MRN: [id number]  HPI  79YO female presents for hospital follow up.  Admitted: 06/24/2014 Discharged: 06/26/2014  Diagnosis: Pre-syncope with sinus bradycardia Held diltiazem and metoprolol. Treated for UTI with Bactrim  Feeling better since discharge. No lightheadedness. No fever, chills.  Compliant with medications.  Only concern today is dry irritated skin over legs and feet.   Past medical, surgical, family and social history per today's encounter.  Review of Systems  Constitutional: Negative for fever, chills, appetite change, fatigue and unexpected weight change.  Eyes: Negative for visual disturbance.  Respiratory: Negative for shortness of breath.   Cardiovascular: Negative for chest pain and leg swelling.  Gastrointestinal: Negative for abdominal pain, diarrhea and constipation.  Genitourinary: Negative for dysuria, urgency, frequency, hematuria and flank pain.  Musculoskeletal: Positive for myalgias, back pain and arthralgias.  Skin: Positive for rash. Negative for color change.  Neurological: Negative for syncope, weakness, light-headedness and headaches.  Hematological: Negative for adenopathy. Does not bruise/bleed easily.  Psychiatric/Behavioral: Negative for sleep disturbance and dysphoric mood. The patient is not nervous/anxious.        Objective:    BP 107/71 mmHg  Pulse 71  Temp(Src) 98.1 F (36.7 C) (Oral)  Ht 5\' 3"  (1.6 m)  Wt 230 lb 4 oz (104.441 kg)  BMI 40.80 kg/m2  SpO2 95% Physical Exam  Constitutional: She is oriented to person, place, and time. She appears well-developed and well-nourished. No distress.  HENT:  Head: Normocephalic and atraumatic.  Right Ear: External ear normal.  Left Ear: External ear normal.  Nose: Nose normal.  Mouth/Throat: Oropharynx is clear and moist. No oropharyngeal exudate.  Eyes: Conjunctivae are normal. Pupils are equal, round,  and reactive to light. Right eye exhibits no discharge. Left eye exhibits no discharge. No scleral icterus.  Neck: Normal range of motion. Neck supple. No tracheal deviation present. No thyromegaly present.  Cardiovascular: Normal rate, regular rhythm, normal heart sounds and intact distal pulses.  Exam reveals no gallop and no friction rub.   No murmur heard. Pulmonary/Chest: Effort normal and breath sounds normal. No respiratory distress. She has no wheezes. She has no rales. She exhibits no tenderness.  Musculoskeletal: Normal range of motion. She exhibits no edema or tenderness.  Lymphadenopathy:    She has no cervical adenopathy.  Neurological: She is alert and oriented to person, place, and time. No cranial nerve deficit. She exhibits normal muscle tone. Coordination normal.  Skin: Skin is warm and dry. Rash noted. Rash is maculopapular. She is not diaphoretic. No erythema. No pallor.     Psychiatric: She has a normal mood and affect. Her behavior is normal. Judgment and thought content normal.          Assessment & Plan:   Problem List Items Addressed This Visit      High   Hypertension    BP Readings from Last 3 Encounters:  06/27/14 107/71  06/26/14 148/53  05/25/14 145/73   BP well controlled today. Continue Lisinopril. Renal function with labs.        Unprioritized   Atrial fibrillation - Primary    BP Readings from Last 3 Encounters:  06/27/14 107/71  06/26/14 148/53  05/25/14 145/73   BP and HR well controlled. Off Metoprolol and Diltiazem. Continues on Amiodarone for rate/rhythm control. NSR on exam today. Continue Eliquis for anticoagulation.      Relevant Orders   Ambulatory  referral to Home Health   Bradycardia   Chronic UTI    Urine culture shows E. Coli. Await sensitivities. Continue Bactrim.      Dermatitis    Dry skin over legs. Encouraged use of emollient cream. Follow up prn.      Pre-syncope    Likely secondary to bradycardia which has  not improved. Will continue to monitor.          Return in about 4 weeks (around 07/25/2014) for Recheck.

## 2014-06-27 NOTE — Assessment & Plan Note (Signed)
BP Readings from Last 3 Encounters:  06/27/14 107/71  06/26/14 148/53  05/25/14 145/73   BP and HR well controlled. Off Metoprolol and Diltiazem. Continues on Amiodarone for rate/rhythm control. NSR on exam today. Continue Eliquis for anticoagulation.

## 2014-06-27 NOTE — Assessment & Plan Note (Signed)
Likely secondary to bradycardia which has not improved. Will continue to monitor.

## 2014-06-27 NOTE — Patient Instructions (Signed)
Continue Bactrim for urinary tract infection.  Use Vasaline or Eucerin to lower legs at night.  Follow up in 4 weeks.

## 2014-06-27 NOTE — Assessment & Plan Note (Signed)
Urine culture shows E. Coli. Await sensitivities. Continue Bactrim.

## 2014-06-27 NOTE — Assessment & Plan Note (Signed)
Dry skin over legs. Encouraged use of emollient cream. Follow up prn.

## 2014-06-27 NOTE — Progress Notes (Signed)
Pre visit review using our clinic review tool, if applicable. No additional management support is needed unless otherwise documented below in the visit note. 

## 2014-06-27 NOTE — Assessment & Plan Note (Signed)
BP Readings from Last 3 Encounters:  06/27/14 107/71  06/26/14 148/53  05/25/14 145/73   BP well controlled today. Continue Lisinopril. Renal function with labs.

## 2014-06-28 LAB — BASIC METABOLIC PANEL
BUN/Creatinine Ratio: 13 (ref 11–26)
BUN: 17 mg/dL (ref 8–27)
CO2: 22 mmol/L (ref 18–29)
Calcium: 9 mg/dL (ref 8.7–10.3)
Chloride: 106 mmol/L (ref 97–108)
Creatinine, Ser: 1.31 mg/dL — ABNORMAL HIGH (ref 0.57–1.00)
GFR calc non Af Amer: 39 mL/min/{1.73_m2} — ABNORMAL LOW (ref >59)
GFR, EST AFRICAN AMERICAN: 45 mL/min/{1.73_m2} — AB (ref >59)
GLUCOSE: 146 mg/dL — AB (ref 65–99)
Potassium: 4.7 mmol/L (ref 3.5–5.2)
Sodium: 143 mmol/L (ref 134–144)

## 2014-06-28 LAB — URINE CULTURE
Colony Count: >=100000
SPECIAL REQUESTS: NORMAL

## 2014-06-30 ENCOUNTER — Encounter: Payer: Commercial Managed Care - HMO | Admitting: Physician Assistant

## 2014-07-06 ENCOUNTER — Encounter: Payer: Self-pay | Admitting: Cardiovascular Disease

## 2014-07-06 ENCOUNTER — Other Ambulatory Visit: Payer: Commercial Managed Care - HMO

## 2014-07-06 ENCOUNTER — Ambulatory Visit (INDEPENDENT_AMBULATORY_CARE_PROVIDER_SITE_OTHER): Payer: Commercial Managed Care - HMO | Admitting: Cardiovascular Disease

## 2014-07-06 VITALS — BP 142/54 | HR 51 | Ht 63.0 in | Wt 229.0 lb

## 2014-07-06 DIAGNOSIS — I158 Other secondary hypertension: Secondary | ICD-10-CM

## 2014-07-06 DIAGNOSIS — E1165 Type 2 diabetes mellitus with hyperglycemia: Secondary | ICD-10-CM

## 2014-07-06 DIAGNOSIS — I6523 Occlusion and stenosis of bilateral carotid arteries: Secondary | ICD-10-CM

## 2014-07-06 DIAGNOSIS — E1159 Type 2 diabetes mellitus with other circulatory complications: Secondary | ICD-10-CM

## 2014-07-06 DIAGNOSIS — E1151 Type 2 diabetes mellitus with diabetic peripheral angiopathy without gangrene: Secondary | ICD-10-CM

## 2014-07-06 DIAGNOSIS — I25119 Atherosclerotic heart disease of native coronary artery with unspecified angina pectoris: Secondary | ICD-10-CM

## 2014-07-06 DIAGNOSIS — I480 Paroxysmal atrial fibrillation: Secondary | ICD-10-CM

## 2014-07-06 NOTE — Patient Instructions (Signed)
You are doing well.  Continue lasix 40 mg once a day Monitor your weight  Please call us if you have new issues that need to be addressed before your next appt.  Your physician wants you to follow-up in: 6 months.  You will receive a reminder letter in the mail two months in advance. If you don't receive a letter, please call our office to schedule the follow-up appointment.

## 2014-07-06 NOTE — Assessment & Plan Note (Signed)
Right: 40-59% ICA stenosis. Left: 1-39% ICA  Bilateral: Vertebral artery flow is antegrade. Mild to moderate ECA stenosis. 

## 2014-07-06 NOTE — Assessment & Plan Note (Signed)
Currently with no symptoms of angina. No further workup at this time. Continue current medication regimen. 

## 2014-07-06 NOTE — Assessment & Plan Note (Signed)
We have encouraged continued exercise, careful diet management in an effort to lose weight. 

## 2014-07-06 NOTE — Progress Notes (Signed)
Patient ID: [name] [name], female    DOB: [birth year], 80 y.o.   MRN: [id number]  HPI Comments: [name] [name] is a 80 year old woman with a history of CAD, status post PTCA/cutting balloon angioplasty for in-stent restenosis of the RCA,  Who presents for routine followup of her atrial fibrillation history of total right knee replacement,  also history of spinal stenosis.  Found to be in atrial fibrillation starting between September 14 and February 06 2014.  in the hospital 02/06/2014 with dizziness, general weakness, nausea, blood sugars 307 CT scan of the head showed old stroke, incidental meningioma EKG showed atrial fibrillation  In follow-up today, she reports that she was recently in the hospital for malaise, bradycardia Heart rate was 40 in the hospital. Blood pressure stable. Her diltiazem and metoprolol were held and Lasix was decreased down to 40 mg daily from 40 mg twice a day. Since she has been home, she has been feeling better but concerned heart rate occasionally runs in the 50s. Blood pressure relatively stable. She denies any lower extremity edema Hospital records reviewed with her showing mildly elevated creatinine. She denies any chest pain, anginal-type symptoms, no lightheadedness or dizziness   EKG on today's visit shows normal sinus rhythm with rate 51 bpm, no significant ST or T-wave changes  Other past medical history  cardioversion 04/11/2014 which was successful.  chronic left osteoarthritis knee pain.  admitted to the hospital at the end of May 17 2013 with discharge 05/18/2013 with diagnosis of chest pain. Cardiac enzymes were negative. She had stress test that showed no ischemia.   Repeat hospital admission 06/12/2011 with discharge 06/12/2013. Admitted with chest pain. She had cardiac catheterization that showed 60% mid RCA disease, FFR performed with the results of 0.86, no intervention done. Also with 75% ramus disease, 90% small OM disease. Medical  management was recommended  Prior lab work; Total cholesterol 170, LDL 96.    episode of chest pain in 2012, admitted to the hospital, workup including cardiac enzymes and a stress test. Stress test showed no ischemia and she was discharged home She has significant orthopedic problems. She had a fall in the recent past, broke her left shoulder. Scheduled for surgery on September 18.  Previous Hemoglobin A1c 8.3. After cortisone shot     Allergies  Allergen Reactions  . Oxycodone Nausea And Vomiting    Outpatient Encounter Prescriptions as of 07/06/2014  Medication Sig  . albuterol (PROVENTIL HFA;VENTOLIN HFA) 108 (90 BASE) MCG/ACT inhaler Inhale 2 puffs into the lungs every 6 (six) hours as needed for wheezing or shortness of breath.  Marland Kitchen amiodarone (PACERONE) 100 MG tablet Take 1 tablet (100 mg total) by mouth daily.  Marland Kitchen apixaban (ELIQUIS) 5 MG TABS tablet Take 1 tablet (5 mg total) by mouth 2 (two) times daily.  Marland Kitchen atorvastatin (LIPITOR) 20 MG tablet Take 1 tablet (20 mg total) by mouth daily.  . cyclobenzaprine (FLEXERIL) 5 MG tablet Take 1 tablet (5 mg total) by mouth 3 (three) times daily as needed.  . ferrous sulfate 325 (65 FE) MG tablet Take 325 mg by mouth daily with breakfast.  . fluticasone (FLONASE) 50 MCG/ACT nasal spray Place 2 sprays into the nose daily.  . furosemide (LASIX) 40 MG tablet Take 1 tablet (40 mg total) by mouth daily.  Marland Kitchen gabapentin (NEURONTIN) 300 MG capsule Take 1 capsule (300 mg total) by mouth 3 (three) times daily.  . insulin aspart (NOVOLOG) 100 UNIT/ML injection Before each meal 3 times a day,  140-199 - 2 units, 200-250 - 4 units, 251-299 - 6 units,  300-349 - 8 units,  350 or above 10 units. Insulin PEN if approved, provide syringes and needles if needed.  . insulin glargine (LANTUS) 100 UNIT/ML injection Inject 0.5 mLs (50 Units total) into the skin at bedtime.  Marland Kitchen latanoprost (XALATAN) 0.005 % ophthalmic solution Place 1 drop into both eyes at bedtime.    Marland Kitchen lisinopril (PRINIVIL,ZESTRIL) 10 MG tablet Take 1 tablet (10 mg total) by mouth daily.  . meclizine (ANTIVERT) 25 MG tablet Take 1 tablet (25 mg total) by mouth 2 (two) times daily. As needed for dizziness or nausea (Patient taking differently: Take 25 mg by mouth 2 (two) times daily as needed. As needed for dizziness or nausea)  . metFORMIN (GLUCOPHAGE) 500 MG tablet Take 1 tablet (500 mg total) by mouth 2 (two) times daily.  . nitroGLYCERIN (NITROSTAT) 0.4 MG SL tablet Place 1 tablet (0.4 mg total) under the tongue every 5 (five) minutes as needed.  Marland Kitchen omeprazole (PRILOSEC) 20 MG capsule Take 1 capsule (20 mg total) by mouth 2 (two) times daily.  . potassium chloride SA (K-DUR,KLOR-CON) 20 MEQ tablet Take 1 tablet (20 mEq total) by mouth daily.  . sertraline (ZOLOFT) 50 MG tablet Take 1 tablet (50 mg total) by mouth daily.  Marland Kitchen sulfamethoxazole-trimethoprim (BACTRIM DS,SEPTRA DS) 800-160 MG per tablet Take 1 tablet by mouth every 12 (twelve) hours.  . traMADol (ULTRAM) 50 MG tablet Take 2 tablets (100 mg total) by mouth every 8 (eight) hours as needed for moderate pain.  Marland Kitchen levothyroxine (LEVOTHROID) 150 MCG tablet Take 1 tablet (150 mcg total) by mouth daily.    Past Medical History  Diagnosis Date  . DM (diabetes mellitus)   . PAF (paroxysmal atrial fibrillation)     a. s/p dccv 03/2014;  b. amio/eliquis;  c. pt d/c'd amio 05/2014;  d. 06/2013 bb/ccb d/c 2/2 symptomatic bradycardia.  . Dyslipidemia   . CAD (coronary artery disease)     a. s/p prior PCI RCA with ISR req CBA;  b. 04/2013 Neg MV;  c. 05/2013 Cath: LM 20, LAD 30p, LCX 20p, OM1 90 small, RCA 42m, PDA 40-->Med Rx.  . Osteoarthritis   . Hypothyroidism     hx  . Rheumatoid arthritis(714.0)   . Obesity   . GERD (gastroesophageal reflux disease)   . Depression   . Shoulder fracture, left     Dr. Martha Clan  . Cerebral infarction   . CKD (chronic kidney disease), stage III     Past Surgical History  Procedure Laterality Date   . Rca stent placement  2000  . Restenosis with ptca placement  2005  . Cataract surgery    . Partial hysterectomy    . Replacement total knee  2013    right  . Shoulder surgery  02/04/13  . Cardiac catheterization  06/11/2013  . Coronary angioplasty      Social History  reports that she has never smoked. She has never used smokeless tobacco. She reports that she does not drink alcohol or use illicit drugs.  Family History family history includes Cancer in her mother; Diabetes in her mother; Esophageal cancer in her mother.   Review of Systems  Constitutional: Negative.   Respiratory: Negative.   Cardiovascular: Negative.   Gastrointestinal: Negative.   Musculoskeletal: Positive for back pain, joint swelling, arthralgias and gait problem.  Neurological: Negative.   Hematological: Negative.   Psychiatric/Behavioral: Negative.   All other systems reviewed  and are negative.   BP 142/54 mmHg  Pulse 51  Ht 5\' 3"  (1.6 m)  Wt 229 lb (103.874 kg)  BMI 40.58 kg/m2  Physical Exam  Constitutional: She is oriented to person, place, and time. She appears well-developed and well-nourished.  obese  HENT:  Head: Normocephalic.  Nose: Nose normal.  Mouth/Throat: Oropharynx is clear and moist.  Eyes: Conjunctivae are normal. Pupils are equal, round, and reactive to light.  Neck: Normal range of motion. Neck supple. No JVD present.  Cardiovascular: Normal rate, S1 normal, S2 normal, normal heart sounds and intact distal pulses.  An irregularly irregular rhythm present. Exam reveals no gallop and no friction rub.   No murmur heard. Pulmonary/Chest: Effort normal and breath sounds normal. No respiratory distress. She has no wheezes. She has no rales. She exhibits no tenderness.  Abdominal: Soft. Bowel sounds are normal. She exhibits no distension. There is no tenderness.  Musculoskeletal: Normal range of motion. She exhibits no edema or tenderness.  Lymphadenopathy:    She has no  cervical adenopathy.  Neurological: She is alert and oriented to person, place, and time. Coordination normal.  Skin: Skin is warm and dry. No rash noted. No erythema.  Psychiatric: She has a normal mood and affect. Her behavior is normal. Judgment and thought content normal.    Assessment and Plan  Nursing note and vitals reviewed.

## 2014-07-06 NOTE — Assessment & Plan Note (Signed)
Blood pressure is well controlled on today's visit. No changes made to the medications. 

## 2014-07-06 NOTE — Addendum Note (Signed)
Addended by: Antonieta Iba on: 07/06/2014 01:26 PM   Modules accepted: Level of Service

## 2014-07-06 NOTE — Assessment & Plan Note (Addendum)
Maintaining normal sinus rhythm. Recommended she stay on amiodarone 100 mg daily. metoprolol and diltiazem recently held for bradycardia. Heart rate still running borderline low but she is asymptomatic

## 2014-07-14 ENCOUNTER — Telehealth: Payer: Self-pay | Admitting: *Deleted

## 2014-07-14 NOTE — Telephone Encounter (Signed)
Windy with Speech Pathologist called to report that patient is complaing of left knee pain at an 8 (scal of 1-10).Pt states that this is typical for her. Please advise.

## 2014-07-14 NOTE — Telephone Encounter (Signed)
Toniann Fail, speech pathologist, called to let you know that pt is having left knee pain, this is typical for the pt but she is required to notify PCP per protocol.

## 2014-07-14 NOTE — Telephone Encounter (Signed)
If she is having severe pain, will need to be seen.

## 2014-07-18 ENCOUNTER — Other Ambulatory Visit: Payer: Self-pay | Admitting: Internal Medicine

## 2014-07-20 ENCOUNTER — Other Ambulatory Visit: Payer: Self-pay | Admitting: *Deleted

## 2014-07-20 ENCOUNTER — Telehealth: Payer: Self-pay

## 2014-07-20 ENCOUNTER — Other Ambulatory Visit: Payer: Self-pay | Admitting: Internal Medicine

## 2014-07-20 MED ORDER — LEVOTHYROXINE SODIUM 150 MCG PO TABS: 150.0000 ug | ORAL_TABLET | Freq: Every day | ORAL | Status: DC

## 2014-07-20 NOTE — Telephone Encounter (Signed)
The patient called hoping to get a refill of her levothyroxine medication.

## 2014-07-20 NOTE — Telephone Encounter (Signed)
Rx sent to pharmacy   

## 2014-07-20 NOTE — Telephone Encounter (Signed)
Fine to refill 

## 2014-07-20 NOTE — Telephone Encounter (Signed)
Last TSH lab was on 10/06/13, pt is scheduled to see you on 08/02/14.  Okay to refill?

## 2014-07-21 ENCOUNTER — Other Ambulatory Visit: Payer: Self-pay | Admitting: *Deleted

## 2014-07-21 MED ORDER — LEVOTHYROXINE SODIUM 150 MCG PO TABS: 150.0000 ug | ORAL_TABLET | Freq: Every day | ORAL | Status: DC

## 2014-08-02 ENCOUNTER — Ambulatory Visit: Payer: Commercial Managed Care - HMO | Admitting: Internal Medicine

## 2014-08-03 ENCOUNTER — Telehealth: Payer: Self-pay | Admitting: Internal Medicine

## 2014-08-03 NOTE — Telephone Encounter (Signed)
Patient said she was told her appointment was on Weds 08/03/14 at 2:15pm., but it was 08/02/14 at 2:15pm.  Where can we put her back into the schedule?

## 2014-08-03 NOTE — Telephone Encounter (Signed)
Monday 3/28 at 3:15?

## 2014-08-04 NOTE — Telephone Encounter (Signed)
Could not reach patient by telephone to inform her of the appointment date and time.  Also could not leave a message because an answering machine did not click in.  So I mailed her an appointment reminder and said if Mon. 08/15/14 at 3:15pm was not a good time, to please call the office and reschedule.

## 2014-08-15 ENCOUNTER — Ambulatory Visit (INDEPENDENT_AMBULATORY_CARE_PROVIDER_SITE_OTHER): Payer: Commercial Managed Care - HMO | Admitting: Internal Medicine

## 2014-08-15 ENCOUNTER — Encounter: Payer: Self-pay | Admitting: Internal Medicine

## 2014-08-15 VITALS — BP 105/64 | HR 67 | Temp 98.2°F | Ht 63.0 in | Wt 228.0 lb

## 2014-08-15 DIAGNOSIS — Z23 Encounter for immunization: Secondary | ICD-10-CM | POA: Diagnosis not present

## 2014-08-15 DIAGNOSIS — E1165 Type 2 diabetes mellitus with hyperglycemia: Secondary | ICD-10-CM | POA: Diagnosis not present

## 2014-08-15 DIAGNOSIS — I158 Other secondary hypertension: Secondary | ICD-10-CM

## 2014-08-15 DIAGNOSIS — IMO0002 Reserved for concepts with insufficient information to code with codable children: Secondary | ICD-10-CM

## 2014-08-15 DIAGNOSIS — E1159 Type 2 diabetes mellitus with other circulatory complications: Secondary | ICD-10-CM

## 2014-08-15 DIAGNOSIS — E1151 Type 2 diabetes mellitus with diabetic peripheral angiopathy without gangrene: Secondary | ICD-10-CM

## 2014-08-15 DIAGNOSIS — E785 Hyperlipidemia, unspecified: Secondary | ICD-10-CM

## 2014-08-15 DIAGNOSIS — I63239 Cerebral infarction due to unspecified occlusion or stenosis of unspecified carotid arteries: Secondary | ICD-10-CM

## 2014-08-15 DIAGNOSIS — I480 Paroxysmal atrial fibrillation: Secondary | ICD-10-CM

## 2014-08-15 MED ORDER — INSULIN GLARGINE 100 UNIT/ML ~~LOC~~ SOLN: 50.0000 [IU] | Freq: Every day | SUBCUTANEOUS | Status: DC

## 2014-08-15 MED ORDER — METFORMIN HCL 500 MG PO TABS: 500.0000 mg | ORAL_TABLET | Freq: Two times a day (BID) | ORAL | Status: DC

## 2014-08-15 MED ORDER — APIXABAN 5 MG PO TABS: ORAL_TABLET | ORAL | Status: DC

## 2014-08-15 MED ORDER — TRAMADOL HCL 50 MG PO TABS: 100.0000 mg | ORAL_TABLET | Freq: Three times a day (TID) | ORAL | Status: DC | PRN

## 2014-08-15 NOTE — Assessment & Plan Note (Signed)
Discussed importance of medication compliance, especially given recent stroke. Encouraged compliance with Atorvastatin.

## 2014-08-15 NOTE — Patient Instructions (Signed)
Labs today.  We try to find any samples or programs to help with cost of medications.

## 2014-08-15 NOTE — Assessment & Plan Note (Signed)
Will check lipids with labs today. Encouraged compliance with Atorvastatin.

## 2014-08-15 NOTE — Progress Notes (Signed)
Subjective:    Patient ID: [name] [name], female    DOB: 17-Feb-1935, 80 y.o.   MRN: [id number]  HPI  80YO female presents for follow up.  DM - BG have been well controlled, but did not bring record today, some sugars near 200. Misses doses of insulin on occasion. Taking Lantus 50 units at bedtime and Novolog "small amount." Does not want to have home health assistance with meds. Frustrated by price of medications.  AFIB - No recent chest pain, palpitations. Compliant with medication including Eliquis.  Having persistent right knee pain. Told that she needs knee replacement, but declined for now. Would like to continue Tramadol as she gets some improvement with this.  BP Readings from Last 3 Encounters:  08/15/14 105/64  07/06/14 142/54  06/27/14 107/71   Wt Readings from Last 3 Encounters:  08/15/14 228 lb (103.42 kg)  07/06/14 229 lb (103.874 kg)  06/27/14 230 lb 4 oz (104.441 kg)    Past medical, surgical, family and social history per today's encounter.  Review of Systems  Constitutional: Negative for fever, chills, appetite change, fatigue and unexpected weight change.  Eyes: Negative for visual disturbance.  Respiratory: Negative for shortness of breath.   Cardiovascular: Negative for chest pain, palpitations and leg swelling.  Gastrointestinal: Negative for nausea, vomiting, abdominal pain, diarrhea and constipation.  Musculoskeletal: Positive for myalgias, back pain and arthralgias.  Skin: Negative for color change and rash.  Hematological: Negative for adenopathy. Does not bruise/bleed easily.  Psychiatric/Behavioral: Negative for dysphoric mood. The patient is not nervous/anxious.        Objective:    BP 105/64 mmHg  Pulse 67  Temp(Src) 98.2 F (36.8 C) (Oral)  Ht 5\' 3"  (1.6 m)  Wt 228 lb (103.42 kg)  BMI 40.40 kg/m2  SpO2 96% Physical Exam  Constitutional: She is oriented to person, place, and time. She appears well-developed and well-nourished. No  distress.  HENT:  Head: Normocephalic and atraumatic.  Right Ear: External ear normal.  Left Ear: External ear normal.  Nose: Nose normal.  Mouth/Throat: Oropharynx is clear and moist. No oropharyngeal exudate.  Eyes: Conjunctivae are normal. Pupils are equal, round, and reactive to light. Right eye exhibits no discharge. Left eye exhibits no discharge. No scleral icterus.  Neck: Normal range of motion. Neck supple. No tracheal deviation present. No thyromegaly present.  Cardiovascular: Normal rate, regular rhythm, normal heart sounds and intact distal pulses.  Exam reveals no gallop and no friction rub.   No murmur heard. Pulmonary/Chest: Effort normal and breath sounds normal. No respiratory distress. She has no wheezes. She has no rales. She exhibits no tenderness.  Musculoskeletal: She exhibits no edema or tenderness.       Right knee: She exhibits decreased range of motion. She exhibits no swelling.  Lymphadenopathy:    She has no cervical adenopathy.  Neurological: She is alert and oriented to person, place, and time. No cranial nerve deficit. She exhibits normal muscle tone. Coordination normal.  Skin: Skin is warm and dry. No rash noted. She is not diaphoretic. No erythema. No pallor.  Psychiatric: She has a normal mood and affect. Her behavior is normal. Judgment and thought content normal.          Assessment & Plan:   Problem List Items Addressed This Visit      High   CVA (cerebral infarction)    Discussed importance of medication compliance, especially given recent stroke. Encouraged compliance with Atorvastatin.  Hyperlipidemia    Will check lipids with labs today. Encouraged compliance with Atorvastatin.      Relevant Medications   apixaban (ELIQUIS) tablet   Hypertension    BP Readings from Last 3 Encounters:  08/15/14 105/64  07/06/14 142/54  06/27/14 107/71   BP well controlled. Renal function with labs. Continue current medications.       Relevant Medications   apixaban (ELIQUIS) tablet   Poorly controlled type 2 diabetes mellitus with circulatory disorder - Primary (Chronic)    Will check A1c with labs. Strongly encouraged her to check insulin dosing exactly and discussed risks of taking too much insulin. Encouraged her to consider home health assistance with medications. She declines. Discussed changing insulin to NPH or regular to help with financial constraints, but she declines for now. Will continue Lantus 50units daily and Novolog with meals.      Relevant Medications   metFORMIN (GLUCOPHAGE) tablet   insulin glargine (LANTUS) 100 UNIT/ML injection   apixaban (ELIQUIS) tablet   Other Relevant Orders   Comprehensive metabolic panel   Hemoglobin A1c   Lipid panel   Microalbumin / creatinine urine ratio     Unprioritized   Atrial fibrillation    NSR on exam today. Continue rhythm control with Amiodarone. Continue anticoagulation with Eliquis.      Relevant Medications   apixaban (ELIQUIS) tablet   Diabetes mellitus type 2, uncontrolled   Relevant Medications   metFORMIN (GLUCOPHAGE) tablet   insulin glargine (LANTUS) 100 UNIT/ML injection    Other Visit Diagnoses    Encounter for immunization            Return in about 3 months (around 11/15/2014) for Recheck of Diabetes.

## 2014-08-15 NOTE — Assessment & Plan Note (Signed)
Will check A1c with labs. Strongly encouraged her to check insulin dosing exactly and discussed risks of taking too much insulin. Encouraged her to consider home health assistance with medications. She declines. Discussed changing insulin to NPH or regular to help with financial constraints, but she declines for now. Will continue Lantus 50units daily and Novolog with meals.

## 2014-08-15 NOTE — Progress Notes (Signed)
Pre visit review using our clinic review tool, if applicable. No additional management support is needed unless otherwise documented below in the visit note. 

## 2014-08-15 NOTE — Assessment & Plan Note (Signed)
NSR on exam today. Continue rhythm control with Amiodarone. Continue anticoagulation with Eliquis.

## 2014-08-15 NOTE — Assessment & Plan Note (Signed)
BP Readings from Last 3 Encounters:  08/15/14 105/64  07/06/14 142/54  06/27/14 107/71   BP well controlled. Renal function with labs. Continue current medications.

## 2014-08-16 ENCOUNTER — Telehealth: Payer: Self-pay | Admitting: *Deleted

## 2014-08-16 ENCOUNTER — Other Ambulatory Visit: Payer: Self-pay | Admitting: *Deleted

## 2014-08-16 DIAGNOSIS — E875 Hyperkalemia: Secondary | ICD-10-CM

## 2014-08-16 LAB — LIPID PANEL
Cholesterol: 143 mg/dL (ref 0–200)
HDL: 47.4 mg/dL (ref >39.00)
LDL CALC: 66 mg/dL (ref 0–99)
NonHDL: 95.6
Total CHOL/HDL Ratio: 3
Triglycerides: 149 mg/dL (ref 0.0–149.0)
VLDL: 29.8 mg/dL (ref 0.0–40.0)

## 2014-08-16 LAB — COMPREHENSIVE METABOLIC PANEL
ALBUMIN: 3.9 g/dL (ref 3.5–5.2)
ALT: 14 U/L (ref 0–35)
AST: 17 U/L (ref 0–37)
Alkaline Phosphatase: 67 U/L (ref 39–117)
BILIRUBIN TOTAL: 0.4 mg/dL (ref 0.2–1.2)
BUN: 22 mg/dL (ref 6–23)
CALCIUM: 9.5 mg/dL (ref 8.4–10.5)
CO2: 28 mEq/L (ref 19–32)
Chloride: 102 mEq/L (ref 96–112)
Creatinine, Ser: 1.16 mg/dL (ref 0.40–1.20)
GFR: 48.11 mL/min — ABNORMAL LOW (ref >60.00)
Glucose, Bld: 154 mg/dL — ABNORMAL HIGH (ref 70–99)
POTASSIUM: 5.2 meq/L — AB (ref 3.5–5.1)
SODIUM: 137 meq/L (ref 135–145)
Total Protein: 7.3 g/dL (ref 6.0–8.3)

## 2014-08-16 LAB — HEMOGLOBIN A1C: Hgb A1c MFr Bld: 7.3 % — ABNORMAL HIGH (ref 4.6–6.5)

## 2014-08-16 LAB — MICROALBUMIN / CREATININE URINE RATIO
CREATININE, U: 87.7 mg/dL
MICROALB UR: 5.6 mg/dL — AB (ref 0.0–1.9)
MICROALB/CREAT RATIO: 6.4 mg/g (ref 0.0–30.0)

## 2014-08-16 NOTE — Telephone Encounter (Signed)
Pt coming in tomorrow what labs and dx?  

## 2014-08-16 NOTE — Telephone Encounter (Signed)
Repeat BMP for hyperkalemia

## 2014-08-17 ENCOUNTER — Other Ambulatory Visit (INDEPENDENT_AMBULATORY_CARE_PROVIDER_SITE_OTHER): Payer: Commercial Managed Care - HMO

## 2014-08-17 DIAGNOSIS — E875 Hyperkalemia: Secondary | ICD-10-CM

## 2014-08-17 LAB — BASIC METABOLIC PANEL
BUN: 21 mg/dL (ref 6–23)
CALCIUM: 9.8 mg/dL (ref 8.4–10.5)
CHLORIDE: 102 meq/L (ref 96–112)
CO2: 28 mEq/L (ref 19–32)
Creatinine, Ser: 1.04 mg/dL (ref 0.40–1.20)
GFR: 54.57 mL/min — ABNORMAL LOW (ref >60.00)
GLUCOSE: 194 mg/dL — AB (ref 70–99)
Potassium: 5.1 mEq/L (ref 3.5–5.1)
Sodium: 136 mEq/L (ref 135–145)

## 2014-08-30 ENCOUNTER — Telehealth: Payer: Self-pay | Admitting: *Deleted

## 2014-08-30 ENCOUNTER — Other Ambulatory Visit: Payer: Self-pay | Admitting: *Deleted

## 2014-08-30 DIAGNOSIS — E875 Hyperkalemia: Secondary | ICD-10-CM

## 2014-08-30 NOTE — Telephone Encounter (Signed)
BMP for hyperkalemia 

## 2014-08-30 NOTE — Telephone Encounter (Signed)
Pt coming in tomorrow what labs and dx?  

## 2014-08-31 ENCOUNTER — Other Ambulatory Visit (INDEPENDENT_AMBULATORY_CARE_PROVIDER_SITE_OTHER): Payer: Commercial Managed Care - HMO

## 2014-08-31 DIAGNOSIS — E875 Hyperkalemia: Secondary | ICD-10-CM | POA: Diagnosis not present

## 2014-08-31 LAB — BASIC METABOLIC PANEL
BUN: 23 mg/dL (ref 6–23)
CO2: 26 meq/L (ref 19–32)
Calcium: 9.4 mg/dL (ref 8.4–10.5)
Chloride: 104 mEq/L (ref 96–112)
Creatinine, Ser: 0.98 mg/dL (ref 0.40–1.20)
GFR: 58.43 mL/min — AB (ref >60.00)
GLUCOSE: 193 mg/dL — AB (ref 70–99)
Potassium: 4.6 mEq/L (ref 3.5–5.1)
SODIUM: 138 meq/L (ref 135–145)

## 2014-09-06 NOTE — Discharge Summary (Signed)
PATIENT NAME:  [name] [name], [name] [name] MR#:  [id number] DATE OF BIRTH:  June 17, 1934  DATE OF ADMISSION:  01/08/2012 DATE OF DISCHARGE:  01/14/2012  ADMITTING DIAGNOSIS: Right total knee arthroplasty.   HISTORY OF PRESENT ILLNESS: [name] [name] is a 80 year old female who has had over a year of right knee pain which did not respond to conservative management. She elected to undergo a total knee arthroplasty on the right side given her persistent pain and disability. The patient underwent an uncomplicated right total knee arthroplasty on 01/08/2012. Postoperatively she was admitted to the orthopedic surgery service.   PAST MEDICAL HISTORY:  1. Poorly controlled diabetes mellitus.  2. Hypertension.  3. Coronary artery disease.  4. Asthma.  5. Hypothyroidism. 6. Gastroesophageal reflux disease. 7. Urinary incontinence.   HOME MEDICATIONS:  1. Levothyroxine 150 mcg daily. 2. Lantus 100 units/mL subcutaneous solution 40 units daily. 3. Amlodipine 5 mg daily. 4. Cephalexin 150 mg p.o. daily. 5. Furosemide 40 mg daily. 6. Isosorbide one 10 mg tablet twice a day. 7. Lisinopril 10 mg daily. 8. Meclizine 25 mg daily p.r.n.  9. Metformin 500 mg 2 tablets twice a day. 10. Metoprolol 25 mg twice a day. 11. Nabumetone 750 mg daily. 12. Nitroglycerin 0.4 mg sublingual tablet one tab every 5 minutes p.r.n.  13. Omeprazole 20 mg twice a day. 14. Potassium chloride 20 milliequivalents extended-release 1 tablet twice a day. 15. Simvastatin 40 mg p.o. daily. 16. Cyclobenzaprine 5 mg p.o. three times daily p.r.n.   HOSPITAL COURSE: The patient underwent an uncomplicated right total knee arthroplasty on 01/08/2012. She went to the orthopedic floor postoperatively. The patient initially had significant right knee pain and nausea and vomiting on postoperative day number one. For these reason, she was not able to get out of bed with physical therapy. Adjustments were made to her pain medication regimen including  changing her from Vicodin to oxycodone along with intermittent morphine which helped to improve her pain. She was continued on 24 hours of postoperative Kefzol. The patient was also found to have a urinary tract infection upon presentation and was given Cipro 500 mg twice a day while in the hospital. By postoperative day number two, the patient was able to get out of bed to a chair. Her Foley catheter was discontinued. She continued to have mild nausea which was treated with Zofran and Phenergan. The patient's dressing was changed by Dr. Martha Clan on postoperative day number two. Her incision was clean, dry, and intact. She had no erythema or active drainage from her incision. Over the next couple of days, the patient continued to make progress with physical therapy and with her pain control. By postoperative day number five, the patient continued to show signs of improvement. Her blood sugars continued to be in the high 200s. A medicine consult was requested for this reason. The patient had daily labs drawn throughout her hospitalization which showed a stable hematocrit. She did not require a transfusion while an inpatient. By postoperative day number six the patient was tolerating a p.o. diet. She was making progress with physical therapy. Her vital signs were stable and her incision was clean, dry, and intact. Given her clinical improvement, she was prepared for discharge to rehab.   DISCHARGE INSTRUCTIONS: The patient will require continued physical and occupational therapy while at rehab. She will be partial weight-bearing on the right lower extremity with a walker. She should have daily dressing changes and wound assessment until her incision is completely dry. The patient will need encouragement  to continue working on range of motion of the right knee. The patient should wear her knee immobilizer at night to help her achieve full extension. She will be on Lovenox 40 mg daily for deep vein thrombosis  prophylaxis and should continue wearing her TED stockings until follow-up. She should remain on insulin sliding scale while at rehab in an attempt to get better control of her diabetes. While she was here, the patient had fingerstick blood sugars at meals and before bed and then was placed on a NovoLog insulin sliding scale including 2 units for blood sugar of 150 to 200, 4 units for blood sugar of 201 to 250, 6 units for blood sugar of 251 to 300, 8 units for blood sugar of 301 to 350, and 10 units for 351 to 400. If her blood sugar gets over 400, the facility physician should be contacted immediately.   DISCHARGE MEDICATIONS: The patient will restart all her home medications. She should also remain on metoprolol 25 mg twice a day will be on oxycodone 5 to 10 mg p.o. every 4 to 6 hours p.r.n. for pain.   DISCHARGE FOLLOWUP: The patient will follow up with Dr. Martha Clan in his office in approximately 7 to 10 days for wound check and staple removal.   DISCHARGE PHYSICAL EXAMINATION: The patient was comfortable sitting up in a chair. Her dressing was personally changed by Dr. Martha Clan. Her incision was clean, dry, and intact. She had resolving ecchymosis. There was no active drainage from the incision. The patient had no calf tenderness. She had palpable pedal pulses and intact sensation to light touch throughout the right lower extremity. She had 5/5 strength in all muscle groups of the right lower extremity to manual testing.   DISCHARGE DIAGNOSES:  1. Status post right total knee arthroplasty.  2. Uncontrolled diabetes mellitus. ____________________________ [name] Devoid, MD klk:slb D: 01/14/2012 14:41:00 ET T: 01/14/2012 15:07:13 ET JOB#: [id number]  cc: [name] Devoid, MD, <Dictator> [name] Devoid MD ELECTRONICALLY SIGNED 01/14/2012 23:14

## 2014-09-06 NOTE — Op Note (Signed)
PATIENT NAME:  [name] [name], [name] [name] MR#:  [id number] DATE OF BIRTH:  [birth year]  DATE OF PROCEDURE:  01/08/2012  PREOPERATIVE DIAGNOSIS: Right knee tricompartmental osteoarthritis.   POSTOPERATIVE DIAGNOSIS: Right knee tricompartmental osteoarthritis.   PROCEDURE: Right total knee arthroplasty.   ANESTHESIA: Spinal.   SURGEON: Timoteo Gaul, MD  ASSISTANT: Dr. Earnestine Leys  ESTIMATED BLOOD LOSS: 200 mL.   COMPLICATIONS: None.   IMPLANTS:  Depuy PFC Sigma size 2.5 cemented right femoral component, a size 2.5 tibial rotating platform component, a 12m polyethylene component for a rotating platform and a 339mdiameter all poly patella component  TOURNIQUET TIME: 123 minutes.   INDICATIONS FOR THE SURGERY: Patient is a [age]ear old female who has had persistent right knee pain which has not responded to conservative management including corticosteroid injection and prescription nonsteroidal anti-inflammatories. Patient has had increasing pain and progressive disability as a result of her arthritis. She wished to proceed with surgery given her persistent pain and disability.   I reviewed the risks and benefits of surgery with the patient in my office prior to the date of surgery. She understands the risks of total knee surgery include but are not limited to infection requiring the removal of the components, bleeding requiring blood transfusion, nerve or blood vessel injury including injury to the peroneal nerve leading to foot drop and requiring use of long-term AFO, fracture, dislocation, persistent pain or knee instability, failure of the hardware and the need for component revision. Medical complications include, but are not limited to, deep vein thrombosis and pulmonary embolism, myocardial infarction, stroke, pneumonia, respiratory failure and death. Patient was preoperatively cleared by her primary care physician as well as her cardiologist.   PROCEDURE NOTE: Patient was met in the  preoperative area. I updated the patient's history and physical. She denied any changes to her medical condition since I had seen her in the office. Patient had the right leg signed with the word "yes" according to the hospital's right site protocol. She was then brought to the Operating Room where she underwent a spinal anesthetic. She was placed supine on the operative table. All bony prominences were adequately padded. Patient was then prepped and draped in sterile fashion. A timeout was performed to verify the patient's name, date of birth, medical record number, correct site of surgery, and correct procedure to be performed. It was also used to verify patient had received antibiotics and all appropriate instruments, implants, and radiographic studies were available in the room. Once all in attendance were in agreement, the case began.   A #10 blade was used to make a longitudinal midline incision. Full thickness skin flaps were developed. A medial arthrotomy was then performed. A limited medial soft tissue release was performed. The patella was then everted and the knee was flexed to 90 degrees.   Of note, preoperatively the patient had approximately 15 degree flexion contracture and could flex to approximately 100 degrees.   The Hoffa's fat pad as well as the meniscus and cruciate ligaments were debrided using electrocautery. The medial and lateral retractors were placed to allow for adequate exposure to the knee joint. A drill hole was then used to enter into the femoral canal. This allowed for placement of the intramedullary distal femoral cutting guide. The cutting block was set to 10 mm. The cutting block was then pinned into position. The intramedullary guide was removed and the distal femoral cut was performed using an oscillating saw.   The attention was then turned to the tibia.  The external tibial alignment guide was set in position with a slight posterior slope. The proximal tibial cutting  block was then pinned into position. The proximal tibial osteotomy was then performed. The cutting block was then removed. The knee was brought into full extension. The extension gap was still too tight to allow for placement of a 10 mm spacer block. Therefore 4 additional mm of the proximal tibia and an additional mm off the distal femur was performed. This allowed for placement of a size 10 spacer block in full extension. The knee was then flexed back approximately 90 degrees.   The distal femoral sizing guide was then positioned. This was a posterior referencing guide. The femur was measured to be a size 2.5. The 2.5 femoral cutting block was then malleted into position. A stylus was used to confirm that the cut would not notch the femur. The oscillating saw was then used to make anterior, posterior and chamfer cuts. The cutting block was then removed. The cutting block for the posterior stabilized component was then placed on the distal femur. It was centered on the femur and the box was cut using an oscillating saw. The femoral trial was then placed and found to have excellent fit and coverage.   The attention was then turned to tibial tray sizing. The proximal tibia was well covered with a size 2.5 tray. This was then held into position with pins. The keel punch was then gently malleted into position. The reamer for the tibial keel was then advanced into position. It was then removed and a flanged punch was then [facility] into position. The flange inserting device was then removed as were the pins. A 10 mm trial tray was then placed. With both the femoral and tibial trial components in place, the knee was brought through full range of motion and felt to be ligamentously stable. The patient had excellent range of motion including full extension. The attention was then turned to preparation of the patella.   The patella was sized to be a 38 mm component. An osteotomy was performed at the undersurface of the  patella leaving a thickness of 15 mm. The three peg holes for the patella were then drilled. The trial patella was then placed and the knee was brought into full flexion and extension. The patella was stable with no dislocation or maltracking. All trial components were then removed. The knee was copiously irrigated. A large malleolar posterior osteophyte from the femur was removed carefully using a curved osteotome as well as a Forensic psychologist. Once the knee joint was copiously irrigated the actual components were placed. Methylmethacrylate was used for all components. A size 10 trial was placed while the methylmethacrylate was allowed to cure. Extension force was held on the right knee while the methylmethacrylate cured. Once the methylmethacrylate curing was completed the trial 10 mm insert was removed. The actual 10 mm tibial insert was then placed. The wound was copiously irrigated. The synovium of the knee joint was injected with 0.25% Marcaine plain x30 mL for postop anesthesia. Two limbs of an Autovac drain were placed and brought out the superior and lateral knee. These were both placed intra-articularly. The medial arthrotomy was closed with interrupted #1 Ethibond. The subcutaneous tissue was closed with 0 and 2-0 Vicryl and the skin approximated with staples. The wound was irrigated after each layer of closure with pulse lavage. A dry sterile dressing was applied along with leads for a TENS unit, Polar Care, knee pad  and a knee immobilizer. The patient was then transferred to a hospital bed and brought to the PAC-U in stable condition. I was scrubbed and present for the entire case and all sharp and instrument counts were correct at the conclusion of the case. I spoke with the patient's family postoperatively to let them know the patient was stable in recovery and the case had gone without complication.      IMPLANTS: Patient had a DePuy 2.5 posterior stabilized cemented femoral Sigma component, a  size 2.5 cemented tibial rotating platform tray with an MBT Keel, a DePuy 2.5 tibial insert rotating platform (10 mm) and a 38 mm three pegged oval dome patella component.    ____________________________ Timoteo Gaul, MD klk:cms D: 01/09/2012 17:56:20 ET T: 01/10/2012 08:31:38 ET JOB#: [id number]  cc: Timoteo Gaul, MD, <Dictator>  Timoteo Gaul MD ELECTRONICALLY SIGNED 01/17/2012 16:07

## 2014-09-06 NOTE — Consult Note (Signed)
PATIENT NAME:  [name] [name] MR#:  [id number] DATE OF BIRTH:  Mar 24, 1935  DATE OF CONSULTATION:  01/13/2012  REFERRING PHYSICIAN:  Juanell Fairly, MD CONSULTING PHYSICIAN:  Felipa Furnace, MD  REASON FOR CONSULTATION:  Postoperative management of uncontrolled diabetes.   HISTORY OF PRESENT ILLNESS: [name] is a very nice 80 year old female who has history of severe osteoarthritis. Apparently, the patient has bone to bone contact with medial compartment and she has done multiple things including trying to do weight loss, physical therapy and anti-inflammatories without any significant improvement for what she was diagnosed with failure of nonoperative management and she was taken to the Operating Room on 01/08/2012. This surgery went well without any major issues. Estimated blood loss was 200 milliliters and anesthesia was spinal. The patient underwent a right total knee arthroplasty due to tricompartmental osteoarthritis, again without any significant complications. I was consulted today due to the fact that the patient has very elevated blood sugars since her surgery ranging in between mid 200s to upper 300s. The patient otherwise has not had any major complications and her other vitals are stable. Her blood pressure has been well controlled without any signs of hypotension. The highest blood pressure I see is in the systolics of 150s, but overall has been ranging between the 110s, like 117 to 130s. Overall, the patient has a history of high blood pressure, diabetes, dyslipidemia, obesity, and coronary artery disease. The patient states that her cardiologist is Dr. Mariah Milling and she does not remember the name of her primary care physician. Looking at the records, her primary care physician is Dr. Hillery Aldo. The patient states that her blood sugars at home are usually very elevated on the 200s to 300s. She has never been well controlled she states, and she does not even remember what  her hemoglobin A1c is. The patient has no recollection of multiple changes of her insulin. She says that she has been on her Lantus, Novolin and sliding scale for a while. She takes 40 units of Lantus every night and her Novolin Regular is usually four units to treat blood sugars above 250. She denies any major complications of her diabetes. She states her kidneys are working okay. She states that her eyes have been okay. No history of retinopathy she says although she had her cataracts operated on. She states that her blood pressure is well controlled and her thyroid is also well-controlled.   REVIEW OF SYSTEMS: CONSTITUTIONAL: The patient states that her pain is mostly on her right knee. Prior to surgery and now during the postoperative period she is well controlled with her pain medications. She is getting a little bit sleepy after pain medications, but has not been oversedated. She denies any recent changes in her weight. She has been trying to lose weight for her knee, but has not had any significant success. There is no significant weight gain or edema. Denies any fever or malaise. EYES: Denies any changes in her vision lately. She does wear glasses especially for reading and she had cataract replacement. She denies any retinopathy. Denies any eye swelling or erythema. HEENT: The patient denies any tinnitus, hearing loss, ear pain. Denies any upper respiratory infection symptoms. Denies any post nasal drip. Denies any difficulty swallowing or upper respiratory infection within the past month. NECK: Denies any pain in the neck or lymph nodes. CARDIOVASCULAR: Denies any chest pain, shortness of breath, orthopnea, paroxysmal nocturnal dyspnea, palpitations, or arrhythmias. In the chart there is a note  that the patient had at some point atrial flutter, but the patient denies this information. RESPIRATORY: Denies any hemoptysis, cough, sputum, difficulty breathing at this moment. The patient has history of asthma  and has occasional wheezing, but not recently. It has been very quiet. GASTROINTESTINAL: Denies any dysphagia, abdominal pain. She does have gastroesophageal reflux disease, but it is well controlled. Denies any constipation prior to the surgery. Denies any melena or hematochezia. GENITOURINARY: Denies any polyuria, hematuria or kidney stones. No dysuria. ENDOCRINOLOGY: The patient has thyroid disease and diabetes. She denies any significant polyuria, polydipsia, or polyphagia. She does have urinary incontinence and overreactive bladder. Denies any cold or heat intolerance. SKIN: Denies any rashes or skin lesions. PSYCHIATRIC: Denies any depression and anxiety. NEUROLOGIC: Denies any significant history of headaches, any paresthesias other than her diabetic neuropathy. The patient states that she has numbness on the bottom of her feet. She denies any seizures or decrease of strength. HEMATOLOGIC: Denies any bleeding or overcoagulation or blood clots. MUSCULOSKELETAL: Positive for tricompartment osteoarthritis of the knee. No other joints are giving her trouble right now.   PAST MEDICAL HISTORY:  1. Hypertension.  2. Insulin-dependent diabetes, uncontrolled.  3. Hypothyroidism.  4. Hyperlipidemia.  5. Coronary artery disease.  6. Overreactive bladder.  7. Gastroesophageal reflux disease.  8. Asthma.  9. There is note that the patient had atrial flutter. The patient denies this information. Her cardiologist is Dr. Mariah Milling.  10. Myocardial infarction in 2008. Atrial flutter resolved after myocardial infarction.  11. History of depression.  12. Mild stroke 20 years ago without any residual symptoms.  13. Edema, but no known history of congestive heart failure.   ALLERGIES: No known drug allergies.   FAMILY HISTORY: The patient states that there is no coronary artery disease in the family. There is not diabetes or cancer in her family.   PAST SURGICAL HISTORY:  1. Stent placement on 02/02/1999.  Second stent placement on 06/04/2996.  2. Fibroid tumor removal in 1985. 3. Another stent placed on 01/27/2009. 4. Cataract surgery on 11/11/2001 on the left and 06/15/2003 on the right. 5. Total knee replacement on 01/08/2012.   HOME MEDICATIONS: 1. Amlodipine.  2. Lasix.  3. Keflex.  4. Vicodin.  5. Lantus.  6. Insulin Novolin.  7. Isosorbide.  8. Levothyroxine. 9. Lisinopril. 10. Meclizine. 11. Metformin.  12. Metoprolol.  13. Nitroglycerin. 14. Omeprazole.  15. Potassium.  16. Simvastatin.  17. Cyclobenzaprine.   SOCIAL HISTORY: The patient denies any current alcohol use. Denies any smoking. She is retired.   PHYSICAL EXAMINATION:  VITAL SIGNS: Blood pressure stable 117/67, pulse 73, respiratory rate 20, temperature 98.8.   GENERAL: The patient is alert and oriented x3 in no acute distress. No respiratory distress, hemodynamically stable.   HEENT: Pupils are equal and reactive. Extraocular movements are intact. Mucosa is dry. The patient has no oropharyngeal exudates.   NECK: Supple. No JVD. No thyromegaly. No adenopathy. No masses.   LYMPHATIC: Negative for lymphadenopathy in supraclavicular, epitrochlear, axilla or neck.   CARDIOVASCULAR: Regular rate and rhythm. No murmurs, rubs, or gallops.   LUNGS: Clear without any wheezing, crepitus or associated sounds.   ABDOMEN: Soft, nontender, nondistended. No hepatosplenomegaly. No masses. Bowel sounds are positive. No hepatic stigmata.   GENITAL: Negative for external lesions.   EXTREMITIES: No significant edema. No cyanosis. No clubbing. Right lower extremity at the level of the knee is wrapped with gauze and she has an ice pack system attached her. I did not review pt's  wound at this moment, but neurovascular exam shows no signs of cyanosis. She has good perfusion. Capillary refill is less than 3 seconds, pulses +2. Sensation is maintained. The patient actually states that she is able to feel most of my touch, but it  has been decreased and this is chronic due to her neuropathy.   NEUROLOGIC: Cranial nerves II through XII are intact. Deep tendon reflexes +2. Sensation is decreased on the bottom of the feet, but intact in other places. Motor strength seems to be appropriate in four extremities, even her postoperative extremity seems to be five out of five.   SKIN: Without significant rashes or petechiae.   PSYCH: Mood is normal. A little bit sleepy due to pain medication, but the patient is able to answer all the questions. She is alert and oriented x3. Her mood is normal without any agitation.   MUSCULOSKELETAL: As mentioned above, knees exam shows postoperative changes and the knee is wrapped. No major joint deformity. No exudates or effusions on other joints.   LABORATORY, RADIOLOGICAL AND DIAGNOSTIC DATA: Her blood sugars, as mentioned above, in between 200s and 300s. Her creatinine is 1.17. Her sodium is 132 from 137. Her GFR is around 46%. There has been a bump on creatinine from 0.82 to 1.17 and this is likely due to dehydration. The patient looked dehydrated. Her calcium is 8.4. Her hemoglobin is 8.1 from postoperative day one of 10.6. Platelets 214. White count is 10,000. Urine culture showed Enterobacter cloacae sensitive to multiple antibiotics. White count on urinalysis was 60. The patient has 100 mg of protein. She has cardiac clearance by Dr. Mariah Milling   ASSESSMENT AND PLAN:  1. Status post right total knee replacement. The patient is doing very well. Her pain is well controlled. The patient is ambulating with physical therapy, likely to go home in the next day or two.  2. Type 2 diabetes insulin-dependent, out of control. The patient has history of diabetes for multiple years. She states that her blood sugar has been uncontrolled for years and that her blood sugar usually runs in the 200s and 300s. There is no mention of her hemoglobin A1c. The patient at admission has been taking off Lantus, but we are  going to restart it. She is also only on insulin sliding scale covering blood sugars above 200. I think we can use 2 units to correct every 50 mg/dL above 517. The patient also is going to be started on a new medication for her which is going to be NovoLog and it is going to be given q a.c. t.i.d., that is to treat the carbohydrates and calories that she is going to get with every meal. On top of that, she is going to use the correction dose. We are going to check hemoglobin A1c and we are going to give her some IV fluids right now that will help her blood sugars although she has very dehydrated.  3. Dehydration. The patient has bump on her BUN and creatinine from 0.82 to BUN of 22 and creatinine of 1.17 and she has hyponatremia which is likely to be due to dehydration.  4. IV fluids are going to be given. We are going to give her a liter over 10 hours. I think that she could continue her Lasix for now just in case she had significant overload. It does not seem like that is going to be an issue. If the patient's creatinine continued to  raise I will stop Lasix.  5. Hyponatremia, likely due to hyperglycemia and also dehydration.  6. Coronary artery disease. The patient is low risk for surgery, cleared by Dr. Mariah Milling, asymptomatic at this moment. The patient is taking ACE inhibitor, beta blocker, Imdur and previously aspirin. She is on 325 mg at home.  7. Hypothyroidism. Continue levothyroxine. 8. Dyslipidemia. Continue statin.  9. PPI for prophylaxis.  10. Deep vein thrombosis prophylaxis. Also subcutaneous low molecular weight enoxaparin.   I will continue to follow up along with Dr. Martha Clan. Thank you, Dr. Martha Clan, for allowing [place] to participate in the care of your patient.   TIME SPENT: I spent about 45 minutes with this patient.  ____________________________ Felipa Furnace, MD rsg:ap D: 01/13/2012 16:11:19 ET T: 01/13/2012 16:36:50 ET JOB#: [id number]  cc: Felipa Furnace, MD, <Dictator> Sarah "Sallie" Allena Katz, MD Pearletha Furl MD ELECTRONICALLY SIGNED 01/14/2012 14:44

## 2014-09-09 NOTE — H&P (Signed)
PATIENT NAME:  [name] [name], [name] [name] MR#:  [id number] DATE OF BIRTH:  [birth year]  DATE OF ADMISSION:  05/17/2013  PRIMARY CARE PHYSICIAN: Ronna Polio, MD   PRIMARY CARDIOLOGIST: Julien Nordmann, MD   CHIEF COMPLAINT: Chest pain.   HISTORY OF PRESENT ILLNESS: This is a 80 year old female with known history of coronary artery disease, diabetes, and hypertension who presents with the above complaint. Over the past week, right prior to Christmas, the patient was having chest pressure/pain. She said it lasts minutes. It comes and goes. No relieving or aggravating factors. She feels like a heaviness is on her chest like an elephant is sitting on her chest. At its worse it is an 8 out of 10. Currently she is not having any pressure or pain. She has associated shortness of breath with it, diaphoresis, and radiation to her left arm.   REVIEW OF SYSTEMS: CONSTITUTIONAL: No fever, weight loss or gain, chills.  EYES: No blurred or double vision.  EARS, NOSE, THROAT: No tinnitus, postnasal drip or redness of the oropharynx.  LUNGS: No cough, wheezing, hemoptysis, COPD, or dyspnea. CARDIOVASCULAR: Positive chest pain. No orthopnea, palpitations, or syncope. GASTROINTESTINAL: No nausea, vomiting, diarrhea, abdominal pain, melena, or ulcers.  GENITOURINARY: No dysuria or hematuria.  ENDOCRINE: No polyuria, polydipsia, heat or cold intolerance.  HEMATOLOGIC AND LYMPHATIC: No anemia, easy bruising or bleeding.  SKIN: No rash or lesions. MUSCULOSKELETAL: No swelling or gout. NEUROLOGIC: No history of CVA, TIA, or seizures.  PSYCH: No history of anxiety or depression.   PAST MEDICAL HISTORY: 1.  Coronary artery disease. 2.  Hypertension. 3.  Diabetes. 4.  Hyperlipidemia. 5.  Hypothyroidism. 6.  Diabetic neuropathy. 7.  Glaucoma. 8.  GERD.  ALLERGIES: No known drug allergies.  SOCIAL HISTORY: No tobacco, alcohol, or drug use.   FAMILY HISTORY: Positive for pancreatic cancer. Her father was murdered.    MEDICATIONS: The patient does not have her medication list at this time.   PAST SURGICAL HISTORY:  1.  Rotator cuff surgery. 2.  Knee surgery.  3.  Hysterectomy. 4.  Cataract surgery. 5.  Cardiac stents.   PHYSICAL EXAMINATION: VITAL SIGNS: Temperature 98.3, pulse 72, respirations 20, blood pressure 209/86, 94% on room air.  GENERAL: The patient is alert and oriented, not in acute distress.  HEENT: Head is atraumatic. Pupils are round and reactive. Sclerae are anicteric. Mucous membranes are moist. Oropharynx is clear.  NECK: Supple without JVD, carotid bruit, or enlarged thyroid.  CARDIOVASCULAR: Regular rate and rhythm. No murmurs, gallops, or rubs. PMI is not displaced.  LUNGS: Clear to auscultation without crackles, rales, rhonchi, or wheezing. Normal to percussion.  ABDOMEN: Bowel sounds are present. Nontender, nondistended. No hepatosplenomegaly.  EXTREMITIES: No clubbing, cyanosis, or edema.  NEUROLOGIC: Cranial nerves II through XII are grossly intact. There are no focal deficits.  SKIN: Without rash or lesions.  MUSCULOSKELETAL: No pathology to digits or nails.   LABORATORY AND DIAGNOSTICS: Troponin less than 0.02. White blood cells 9.8, hemoglobin 12, hematocrit 35.5, and platelets are 289. Sodium 140, potassium 3.8, chloride 109, bicarb 24, BUN 19, creatinine 0.78, and glucose 179.   EKG shows normal sinus rhythm. No ST elevation or depression.   ASSESSMENT AND PLAN: This is a 80 year old female with known coronary artery disease status post stents who presents with unstable angina. 1.  Unstable angina. The patient will be admitted to telemetry. Will continue aspirin, beta blocker, statin therapy, and nitroglycerin. We will have cardiology consultation. Continue to follow her troponins. If these are  negative, she may undergo a stress test in the a.m.  2.  Malignant hypertension. The patient's initial blood pressure is 209/86. I have written for metoprolol q. 6 hours, 25 mg,  Norvasc 10 mg, as well as lisinopril and p.r.n. hydralazine dose. I suspect that some of this is related to some anxiety about being in the hospital and her pets being home alone.  3.  Hyperlipidemia. Continue simvastatin. In the past, the patient has been on 40 mg at bedtime. We will continue for now until we have her full medication list.  4.  Diabetes. We will place the patient on Lantus. She just says she is on 40 units. We will place the patient on sliding scale insulin and ADA diet.  5.  Hypothyroidism. The patient was on Synthroid in September at 50 mcg. I am not sure if this is her current dose. For now we will place heron Synthroid 150.   We will obtain all medications and reconcile them once they are in place.  TIME SPENT: Approximately 45 minutes. ____________________________ Janyth Contes. Juliene Pina, MD spm:sb D: 05/17/2013 13:55:40 ET T: 05/17/2013 14:19:25 ET JOB#: [id number]  cc: Tenecia Ignasiak P. Juliene Pina, MD, <Dictator> Ginette Pitman. Dan Humphreys, MD Antonieta Iba, MD Kirin Pastorino P Hollister Wessler MD ELECTRONICALLY SIGNED 05/17/2013 15:04

## 2014-09-09 NOTE — Op Note (Signed)
PATIENT NAME:  [name] [name], [name] [name] MR#:  [id number] DATE OF BIRTH:  [birth year]  DATE OF PROCEDURE:  02/04/2013  PREOPERATIVE DIAGNOSIS: Left shoulder supraspinatus tear with chronic shoulder impingement and acromioclavicular joint arthrosis.   POSTOPERATIVE DIAGNOSIS:  Large V-shaped tear of the left supraspinatus, chronic shoulder impingement with subacromial spur, acromioclavicular joint arthrosis, glenohumeral synovitis and advanced glenohumeral joint arthritis.   PROCEDURE: Left shoulder arthroscopic synovectomy and debridement, subacromial decompression and distal clavicle excision with mini open rotator cuff repair.   ANESTHESIA: General.   SURGEON: Timoteo Gaul, M.D.   ESTIMATED BLOOD LOSS: Minimal.   COMPLICATIONS: None.   INDICATIONS FOR THE PROCEDURE: The patient is an active 80 year old female who injured her left shoulder in mid June of 2014 when she fell in her yard. Since that time she has had difficulty abducting her left shoulder. The patient had significant pain with attempted forward elevation or abduction. She has been unable to perform her crocheting and needlepoint, which she enjoys, because of the limitation of her shoulder motion. The patient also complains of significant pain in the left shoulder. An MRI had revealed a full-thickness and retracted tear of the supraspinatus. I had recommended surgical fixation. The patient agreed. I reviewed the risks and benefits of surgery with the patient in my clinic prior to the date of surgery. She understands the risks include but are not limited to infection, bleeding, nerve or blood vessel injury, shoulder stiffness, persistent pain or weakness of the left shoulder, re-tear of the rotator cuff, failure of the hardware, axillary nerve injury, the need for use of a graft in order to repair the rotator cuff and the need for further surgery. She understands the medical risks include but are not limited to DVT and pulmonary embolism,  myocardial infarction, stroke, pneumonia, respiratory failure and death. The patient understood these risks and wished to proceed.   PROCEDURE NOTE: The patient was met in the preoperative area. She had her left shoulder marked with the word "yes" according to the hospital's right site protocol. I updated the patient's history and physical. A friend was at the bedside with her. The patient was then brought to the Operating Room. Anesthesia did not feel comfortable giving her an interscalene block given her history of wheezing and asthma. Therefore, the patient underwent general endotracheal intubation. She was placed in a beach chair position. All bony prominences were adequately padded. A spider arm positioner was used for this case. On examination under anesthesia, the patient demonstrated no instability on load and shift testing. The patient also had a negative sulcus sign. She had full passive range of motion.   A timeout was performed to verify the patient's name, date of birth, medical record number, correct site of surgery and correct procedure to be performed. It was also used to verify the patient had received antibiotics and that all appropriate instruments, implants and radiographic studies were available in the room. Once all in attendance were in agreement, the case began.   The patient's bony landmarks were drawn out with a surgical marker. The proposed arthroscopy incisions were also drawn out with a surgical marker. These were pre-injected with 1% lidocaine plain. An 11 blade was used to establish the posterior portal where the arthroscope was placed into the glenohumeral joint. Under direct visualization an 18-gauge spinal needle was used to localize an anterior portal. A 5.75 mm arthroscopic cannula was placed through the anterior portal. A full diagnostic examination of the shoulder was then performed. Arthroscopic images were  taken during the shoulder evaluation.   Findings on arthroscopy  included significant glenohumeral synovitis. The patient had extensive cartilage loss off the posterior aspect of the humeral head and significant fraying of the glenoid cartilage. There was no evidence of a labral tear although there was superior labral fraying. The patient had already spontaneously ruptured her biceps tendon. The subscapularis and middle glenohumeral ligament were intact. The patient had a full-thickness tear of the rotator cuff which was a large V-shaped tear. The patient had no loose bodies in the inferior recess but extensive synovitis. She also had what appeared to be a chronic Hill-Sachs lesion which was found non-engaging. The patient's humeral head was not dislocatable.   The superior labral fraying was debrided using a 4-0 resector shaver blade. The 4-0 resector shaver blade and a 90 degree ArthroCare wand were then used to perform a partial synovectomy. The arthroscopic instruments were then removed. The arthroscope was then placed into the subacromial space. An extensive bursectomy was then performed. The 5.5 mm resector shaver blade was then placed in the lateral portal which had been created, again using direct visualization with an 18-gauge spinal needle. A subacromial decompression was performed. Then the 5.5 mm resector shaver blade was placed through the anterior portal and a distal clavicle excision was performed. Through the lateral portal, ArthroCare Smart stitches were placed in the lateral edge of the rotator cuff. An anterolateral accessory portal was also created, again using an 18-gauge spinal needle for localization. This allowed for visualization of the rotator cuff tear through the lateral portal and allowed for arthroscopic placement of sutures within V-shaped rotator cuff tear from the anterolateral portal. Three side-to-side sutures were placed in the rotator cuff. These were clamped with a hemostat for later repair.   A mini open incision was then made along the  lateral border of the acromion in a saber-type fashion. The anterior raphe of the deltoid was then identified. The deltoid was split at the anterior raphe in line with its fibers. A self-retaining shoulder retractor was placed. The previously-placed sutures were then brought out through the deltoid split. The side-to-side sutures were then hand tied to allow for closure of the medial-most aspect of the V-shaped incision.   Two Magnum M anchors were then placed at the articular margin of the humeral head. A single suture from both Magnum M anchors was then placed with a first-pass suture passer through the medial cuff. These again were clamped with a hemostat for later repair. The 2 Smart stitches, 1 on the anterior leaflet and 1 on the posterior leaflet of the V-shaped incision, were then placed through a Magnum 2 anchor and tensioned for lateral repair. The Magnum M anchors were then hand tied to give a double-row fixation of the rotator cuff. Final arthroscopic images through the mini open incision were performed. The arthroscope was then placed back into the glenohumeral joint and arthroscopic images of the mini open repair were performed from the glenohumeral side. The glenohumeral joint was copiously irrigated, as was the mini open incision. The self-retaining retractor was removed along with all other arthroscopic instruments.   The deltoid split was closed with a 0 Vicryl suture in an interrupted fashion. The subcutaneous tissues were closed with 2-0 Vicryl. The saber-type incision was closed with a running 3-0 Monocryl and the 4 portal incisions were closed with 4-0 nylon. The patient's glenohumeral joint and subacromial space were then injected with 0.25% Marcaine plain. Xeroform was placed over the incisions along with  a dry sterile dressing. The patient had TENS unit leads applied above and below the shoulder joint. The Polar Care sleeve was applied on top of the patient's gown over the left shoulder.  The patient's shoulder was placed in an abduction sling. She was then awoken and transferred to a hospital bed and brought to the PACU in stable condition.   I was scrubbed and present for the entire case and all sharp and instrument counts were correct at the conclusion of the case. I spoke with the patient's family friend, who had brought her in for surgery, and explained to her that the patient was stable in the recovery room and the case had gone without complication.    ____________________________ Timoteo Gaul, MD klk:cs D: 02/08/2013 18:23:59 ET T: 02/08/2013 19:05:12 ET JOB#: [id number]  cc: Timoteo Gaul, MD, <Dictator> Timoteo Gaul MD ELECTRONICALLY SIGNED 02/12/2013 10:01

## 2014-09-09 NOTE — Consult Note (Signed)
Brief Consult Note: Diagnosis: 1. s/p Left rotator cuff repair 2. DM 3. HTN 4. Hyperlipidemia 5. Hypothyrodism 6. Glaucoma 7. Diabetic Neuropathy 8. GERD.   Patient was seen by consultant.   Consult note dictated.   Orders entered.   Comments: 80 yo female w/ hx of DM, HtN, hyperlipidemia, Glaucoma, GERD, diatbeic neuropathy, came into hospital for left rotator cuff repair and post-op noted to be hyperglcyemic and having significant pain in the left shoulder area.  Lives at home by herself and cannot care for herself presently.    1. Hyperglycemia - likely due to pt. not taking all her meds prior to surgery.  - will resume half dose Lantus, cont. SSI, Metformin and follow sugars.   2. Hypothyroidism - cont. synthroid.   3. HTN - cont. Metoprolol, Lisinopril, Imdur.    4. Hyperlipidemia - cont. Simvastatin  5. Glaucoma - cont. Xalantan eye drops  6. s/p Left rotator cuff repair - cont. care as per Ortho.   7. Diabetic Neuropathy - cont. neurontin.   Thanks for the consult and will follow with you.  Job # [id number].  Electronic Signatures: Houston Siren (MD)  (Signed 18-Sep-14 15:34)  Authored: Brief Consult Note   Last Updated: 18-Sep-14 15:34 by Houston Siren (MD)

## 2014-09-09 NOTE — Discharge Summary (Signed)
PATIENT NAME:  [name] [name], RACHELS MR#:  [id number] DATE OF BIRTH:  1935/04/08  DATE OF ADMISSION:  05/17/2013 DATE OF DISCHARGE:  05/18/2013  ADMISSION DIAGNOSIS:  Chest pain.   DISCHARGE DIAGNOSES: 1.  Chest pain.  2.  History of coronary artery disease.  3.  Hypothyroidism.  4.  Diabetes .   CONSULTATIONS: None.   IMAGING: The patient underwent a Myoview stress test which essentially showed a normal ejection fraction. No wall motion abnormality. Essentially, this is in normal stress test.   LDL is 96, VLDL 27, cholesterol 170, triglycerides is 133, troponins were negative.   HOSPITAL COURSE: A 80 year old, very pleasant female with coronary artery disease who presented with chest pain. For further details please refer to the H and P.  1.  Chest pain. The patient presented with typical symptoms of  unstable angina. She had no chest pain during this hospitalization. Her troponins are negative. Telemetry was negative. She underwent a stress test, which essentially was normal. If she experiences this pain again, I would suggest that she have arm cardiac catheterization. She is to continue outpatient medications.  2.  History of hypothyroidism. The patient is continued on her outpatient medications.  3.  Hyperlipidemia.  LDL is 96.  She should have better LDL,  cholesterol less than 70. This can be discussed with her primary cardiologist Dr. Mariah Milling.  4.  Diabetes.  The patient is to continue outpatient medication.  5.  Hypothyroidism.  The patient will continue on Synthroid.    DISCHARGE MEDICATIONS: 1.  Synthroid 150 mcg daily.  2.  Lasix 40 mg daily.  3.  Lisinopril 10 mg daily.  4.  Meclizine 25 mg as needed.  5.  Omeprazole 20 mg b.i.d.  6.  Gabapentin 100 mg t.i.d.  7.  Lopressor 50 mg half tablet b.i.d.  8.  KCl 20 mEq b.i.d.  9.  Cyclobenzaprine 5 mg t.i.d. p.r.n.  10.  Norvasc 10 mg daily.  11.  Aspirin 81 mg daily.  12.  Flonase 50 mcg as needed.  13.  Imdur 10 mg b.i.d.   14.  Zilactin 0.005% ophthalmic solution at bedtime.  15. Vitamin D3 2000 international units daily.  16.  Gentamicin topical cream t.i.d.  17.  Novolin 4 units t.i.d. if blood sugar greater than 250.  18.  Metformin 500 mg b.i.d.  19.  Nitroglycerin sublingual p.r.n. chest pain.  20.  Nystatin affected area b.i.d.  21.  Simvastatin 40 mg at bedtime.  22.  Lantus 40 units at bedtime.  23.  Tylenol 325, 1 tablet q. 4 hours p.r.n., pain or fever.  24.  Oxycodone 5 mg 1 to 2 tablets q. 4-6 hours p.r.n.  25.  Meloxicam 15 mg daily.  26.  Promethazine 12.5 q. 6 hours p.r.n.  27.  Ferrous sulfate 325 mg daily.   DISCHARGE DIET: Low sodium, ADA diet.   DISCHARGE ACTIVITY: As tolerated.   DISCHARGE FOLLOW-UP:  The patient will need to follow up with Dr. Mariah Milling in one week.   TIME SPENT: Approximately 35 minutes. The patient is able for discharge.   ____________________________ Aisa Schoeppner P. Juliene Pina, MD spm:cc D: 05/18/2013 18:01:25 ET T: 05/19/2013 00:47:28 ET JOB#: [id number]  cc: Etana Beets P. Juliene Pina, MD, <Dictator> Antonieta Iba, MD Janyth Contes Cortina Vultaggio MD ELECTRONICALLY SIGNED 05/19/2013 16:11

## 2014-09-09 NOTE — Consult Note (Signed)
General Aspect Reason for consultation:  Chest pain and dyspnea   Present Illness The patient was seen in the past by Dr. Rockey Situ.  She has a history of CAD with stent to the RCA and cutting balloon for instent restenosis.  She was last admitted in 2012 with chest pain and had a negative stress perfusion study.  She was admitted for evaluation of chest pain.  This started shortly before Christmas.  She says that it comes and goes and apparently lasts for minutes at a time.  It is under the left axilla and breast with some radiation to the posterior neck on both sides.  She does not know if this is like her previous angina.  It can be 9/10 in intensity.  She does not describe associated symptoms.  She does have increased dyspnea that she reports with very mild exertion such as walking across her house.  She usually sleeps with her head elevated and this has not changed.  She has not had a cough or fever.  She does not have weight gain or edema.    PMH:  CAD HTN HYPERLIPIDEMIA HYPERTHYROIDISM GLAUCOMA GERD  PSH: Shoulder surgery Partial hysterectomy Right TKR TA Cataract  Social History Widow She has two dogs and a talking bird She does not smoke   Physical Exam:  GEN well developed, no acute distress, obese   HEENT PERRL   NECK supple  No masses   CARD Regular rate and rhythm  Normal, S1, S2  No murmur   ABD denies tenderness  denies Flank Tenderness  normal BS   LYMPH negative neck   EXTR negative cyanosis/clubbing, negative edema   SKIN normal to palpation   NEURO cranial nerves intact   PSYCH A+O to time, place, person   Review of Systems:  Subjective/Chief Complaint As stated in the HPI.  Positive for constipation.  Negative for all other systems.   Medications/Allergies Reviewed Medications/Allergies reviewed   Home Medications: Medication Instructions Status  acetaminophen 325 mg oral tablet 1 tab(s) orally every 4 hours, As needed, pain or temp. greater  than 100.4 Active  oxyCODONE 5 mg oral tablet 1 - 2 tab(s) orally every 4 to 6 hours, As needed, pain Active  meloxicam 15 mg oral tablet 1 tab(s) orally once a day Active  promethazine 12.5 mg oral tablet 1 tab(s) orally every 6 hours, As needed, nausea, vomiting Active  Vitamin D3 2000 intl units tablet 1 tab(s) orally once a day x 30 days Active  levothyroxine 150 mcg (0.15 mg) oral tablet 1 tab(s) orally once a day Active  furosemide 40 mg oral tablet 1 tab(s) orally once a day Active  lisinopril 10 mg oral tablet 1 tab(s) orally once a day (in the morning) Active  meclizine 25 mg oral tablet 1 tab(s) orally once a day, As Needed Active  omeprazole 20 mg oral delayed release capsule 1 cap(s) orally 2 times a day Active  potassium chloride 20 mEq oral tablet, extended release 1 tab(s) orally 2 times a day Active  cyclobenzaprine 5 mg oral tablet 1 tab(s) orally 3 times a day, As Needed Active  amLODIPine 10 mg oral tablet 1 tab(s) orally once a day Active  aspirin 81 mg oral tablet 1 tab(s) orally once a day Active  Flonase 50 mcg/inh nasal spray 1 spray(s) nasal once a day, As Needed Active  isosorbide mononitrate 10 mg oral tablet 1 tab(s) orally 2 times a day Active  Xalatan 0.005% ophthalmic solution 1 drop(s)  to each affected eye once a day (at bedtime) Active  gentamicin topical 0.1% topical cream Apply topically to affected area 3 times a day Active  NovoLIN R human recombinant 100 units/mL injectable solution 4 unit(s) injectable 3 times a day (with meals)  if blood sugar greater than 250 Active  metFORMIN 500 mg oral tablet 1 tab(s) orally 2 times a day Active  nitroglycerin 0.4 mg sublingual tablet 1 tab(s) sublingual every 5 minutes, As Needed Active  nystatin topical 100000 units/g topical cream Apply topically to affected area 2 times a day Active  simvastatin 40 mg oral tablet 1 tab(s) orally once a day (at bedtime) Active  Lantus 100 units/mL subcutaneous solution 40 unit(s)  subcutaneous once a day (at bedtime) Active  ferrous sulfate 325 mg oral tablet 1 tab(s) orally once a day Active  gabapentin 100 mg oral capsule 1 tab(s) orally 3 times a day Active  Lopressor 50 mg oral tablet 0.5 tab(s) orally 2 times a day Active   Lab Results: Routine Chem:  30-Dec-14 05:01   Glucose, Serum  216  BUN  21  Creatinine (comp) 0.87  Sodium, Serum 140  Potassium, Serum 3.8  Chloride, Serum  108  CO2, Serum 27  Calcium (Total), Serum 8.7  Anion Gap  5  Osmolality (calc) 289  eGFR (African American) >60  eGFR (Non-African American) >60 (eGFR values <71m/min/1.73 m2 may be an indication of chronic kidney disease (CKD). Calculated eGFR is useful in patients with stable renal function. The eGFR calculation will not be reliable in acutely ill patients when serum creatinine is changing rapidly. It is not useful in  patients on dialysis. The eGFR calculation may not be applicable to patients at the low and high extremes of body sizes, pregnant women, and vegetarians.)  Cholesterol, Serum 170  Triglycerides, Serum 133  HDL (INHOUSE) 47  VLDL Cholesterol Calculated 27  LDL Cholesterol Calculated 96 (Result(s) reported on 18 May 2013 at 05:43AM.)  Cardiac:  30-Dec-14 05:01   Troponin I < 0.02 (0.00-0.05 0.05 ng/mL or less: NEGATIVE  Repeat testing in 3-6 hrs  if clinically indicated. >0.05 ng/mL: POTENTIAL  MYOCARDIAL INJURY. Repeat  testing in 3-6 hrs if  clinically indicated. NOTE: An increase or decrease  of 30% or more on serial  testing suggests a  clinically important change)  Routine Hem:  29-Dec-14 10:27   WBC (CBC) 9.8  RBC (CBC) 4.08  Hemoglobin (CBC) 12.1  Hematocrit (CBC) 35.5  Platelet Count (CBC) 289  MCV 87  MCH 29.6  MCHC 34.0  RDW 13.7  Neutrophil % 68.3  Lymphocyte % 20.7  Monocyte % 8.4  Eosinophil % 1.2  Basophil % 1.4  Neutrophil #  6.7  Lymphocyte # 2.0  Monocyte # 0.8  Eosinophil # 0.1  Basophil # 0.1 (Result(s) reported  on 17 May 2013 at 10:41AM.)   EKG:  EKG Interp. by me  NSR   Interpretation Poor anterior R wave progression, no acute ST T wave changes   Rate 71   Radiology Results: XRay:    29-Dec-14 14:43, Chest PA and Lateral  Chest PA and Lateral   REASON FOR EXAM:    dyspnea, chest pain  COMMENTS:       PROCEDURE: DXR - DXR CHEST PA (OR AP) AND LATERAL  - May 17 2013  2:43PM     CLINICAL DATA:  Shortness of breath, chest pain    EXAM:  CHEST  2 VIEW    COMPARISON:  December 31, 2011    FINDINGS:  The heart size and mediastinal contours are stable. There are  minimal bilateral posterior pleural effusions. There is minor linear  scar of the left lung base unchanged. There is no focal pneumonia or  pulmonary edema. The visualized skeletal structures are  unremarkable. Postsurgical changes are identified in the left  shoulder.     IMPRESSION:  Minimal bilateral posterior pleural effusions.      Electronically Signed    By: Abelardo Diesel M.D.    On: 05/17/2013 14:47         Verified By: Abelardo Diesel, M.D.,    No Known Allergies:   Vital Signs/Nurse's Notes: **Vital Signs.:   30-Dec-14 12:31  Vital Signs Type Routine  Temperature Temperature (F) 97.7  Celsius 36.5  Temperature Source oral  Pulse Pulse 60  Respirations Respirations 18  Systolic BP Systolic BP 865  Diastolic BP (mmHg) Diastolic BP (mmHg) 67  Mean BP 93  Pulse Ox % Pulse Ox % 93  Pulse Ox Activity Level  At rest  Oxygen Delivery Room Air/ 21 %    Plan CHEST PAIN:  The chest pain is very atypical.  Stress test results are pending.  I agree that she can be discharged if this is negative for wall motion abnormalties.    DYSPNEA:  The etiology of this is not clear.  She might have some element of diastolic dysfunction.  This is also likely related to weight and deconditioning.  However, she reports that she has lost weight.  She has had very elevated BPs and this is likely contributing to her symptoms.  I  will weight to see her EF on echo.  BNP would also be helpful.  She might need further pulmonary testing based on these results.  HTN.  For now I would continue the current meds.  She needs to present with a BP diary for a follow up with Dr. Rockey Situ and probable further med titration.  She needs continued weight loss.   Electronic Signatures: Minus Breeding (MD)  (Signed 30-Dec-14 17:22)  Authored: General Aspect/Present Illness, History and Physical Exam, Review of System, Home Medications, Labs, EKG , Radiology, Allergies, Vital Signs/Nurse's Notes, Impression/Plan   Last Updated: 30-Dec-14 17:22 by Minus Breeding (MD)

## 2014-09-09 NOTE — Consult Note (Signed)
PATIENT NAME:  [name] [name], [name] [name] MR#:  [id number] DATE OF BIRTH:  [birth year]  DATE OF CONSULTATION:  02/04/2013  REFERRING PHYSICIAN:  Juanell Fairly, MD CONSULTING PHYSICIAN:  Rolly Pancake. Cherlynn Kaiser, MD  PRIMARY CARE PHYSICIAN: Ronna Polio, MD  REASON FOR CONSULTATION: Medical management and uncontrolled blood sugars.   HISTORY OF PRESENT ILLNESS: This is a 80 year old female who was brought into the hospital for elective left shoulder surgery for rotator cuff repair on the left shoulder. Postoperatively and even preoperatively the patient has been noted to be severely hyperglycemic with blood sugars in the 4 to 500s. They have been treated adequately and they have improved, but because the patient lives by herself, is having significant pain postoperatively, she is being admitted to the orthopedic service for observation for better pain control and management of her blood sugars. Hospitalist services were contacted for medical management and management of her blood sugars. The patient presently complains of left shoulder pain, but no other associated symptoms presently. She denies any chest pain, any shortness of breath, nausea, vomiting, abdominal pain, fevers, chills, cough or any other associated symptoms presently.   REVIEW OF SYSTEMS: CONSTITUTIONAL: No documented fever. No weight gain. No weight loss.  EYES: No blurry or double vision.  ENT: No tinnitus. No postnasal drip. No redness of the oropharynx.  RESPIRATORY: No cough, no wheeze, no hemoptysis and no dyspnea.  CARDIOVASCULAR: No chest pain, no orthopnea, no palpitations and no syncope.  GASTROINTESTINAL: No nausea, no vomiting and no diarrhea. No abdominal pain. No melena or hematochezia.  GENITOURINARY: No dysuria, no hematuria. ENDOCRINE: No polyuria or nocturia. No heat or cold intolerance.  HEMATOLOGIC: No anemia. No bruising. No bleeding.  INTEGUMENTARY: No rashes. No lesions.  MUSCULOSKELETAL: No arthritis. No swelling. No  gout.  NEUROLOGIC: No numbness. No tingling. No ataxia. No seizure type activity. PSYCHIATRIC: No anxiety. No insomnia. No ADD.   PAST MEDICAL HISTORY: Is consistent with hypertension, diabetes, hyperlipidemia, hypothyroidism, diabetic neuropathy, glaucoma, GERD.  ALLERGIES: No known drug allergies.   SOCIAL HISTORY: No smoking. No alcohol abuse. No illicit drug abuse. Lives by herself.   FAMILY HISTORY: Both mother and father are deceased. Mother died from pancreatic cancer. Father was murdered.   CURRENT MEDICATIONS: 1.  Tylenol with hydrocodone 5/325 mg 1 to 2 times daily. 2.  Amlodipine 10 mg daily. 3.  Aspirin 81 mg daily. 4.  Flexeril 5 mg t.i.d. as needed. 5.  Iron sulfate 325 mg daily. 6.  Flonase 1 spray to each nostril daily. 7.  Lasix 40 mg daily. 8.  Gabapentin 100 mg t.i.d. 9.  Gentamicin topical cream to be applied t.i.d. as needed. 10.  Imdur 10 mg b.i.d.  11.  Lantus 40 units at bedtime. 12.  Synthroid 150 mcg daily. 13.  Lisinopril 10 mg daily.  14.  Lopressor 25 mg b.i.d. 15.  Meclizine 25 mg daily as needed. 16.  Metformin 500 mg b.i.d. 17.  Nitroglycerin sublingual as needed. 18.  Novolin 4 units t.i.d. with meals. 19.  Omeprazole 20 mg b.i.d. 20.  Potassium 20 mEq b.i.d. 21.  Simvastatin 40 mg daily. 22.  Ultram 50 mg q. 8 hours as needed. 23.  Vicodin 1 tab q. 6 hours as needed. 24.  Vitamin D3 2000 international units daily. 25.  Xalatan 0.005% ophthalmic solution 1 drop to each affected eye at bedtime.   PHYSICAL EXAMINATION: Presently is as follows:  VITAL SIGNS: She is afebrile, pulse 66, respirations 11, blood pressure 113/61 and sats are 94%  on room air.  GENERAL: She is a pleasant-appearing female in mild amount of pain.  HEENT: Atraumatic, normocephalic. Her extraocular muscles are intact. Pupils equal and reactive to light. Sclerae anicteric. No conjunctival injection. No pharyngeal erythema.  NECK: Supple. There is no jugular venous  distention, no bruits, no lymphadenopathy or no thyromegaly.  HEART: Regular rate and rhythm, tachycardic. No murmurs. No rubs. No clicks.  LUNGS: Clear to auscultation bilaterally. No rales, no rhonchi and no wheezes.  ABDOMEN: Soft, flat, nontender and nondistended. Has good bowel sounds. No hepatosplenomegaly appreciated.  EXTREMITIES: No evidence of any cyanosis, clubbing or peripheral edema. Has +2 pedal and radial pulses bilaterally. Her left shoulder is in a sling. She has a dressing on her left shoulder.  NEUROLOGIC: She is alert, awake and oriented x 3 with no focal motor or sensory deficits appreciated bilaterally.  SKIN: Moist and warm with no rashes.  LYMPHATIC: There is no cervical or axillary lymphadenopathy.   LABORATORY AND DIAGNOSTICS: Serum glucose 217. The other blood work is still pending.   ASSESSMENT AND PLAN: This is a 80 year old female with a history of diabetes, hypertension, hyperlipidemia, glaucoma, gastroesophageal reflux disease and diabetic nephropathy who presents to the hospital for left rotator cuff repair and postoperatively noted to be hyperglycemic and having significant pain in her left shoulder area. The patient lives at home by herself and therefore cannot take care of herself presently therefore is being admitted for pain control, also management of her blood sugars.  1.  Hyperglycemia. This is likely secondary to the fact that the patient did not take all of her diabetic meds prior to having surgery. For now I will resume her Lantus at half dose as she is not taking p.o. that well. Continue sliding scale insulin. Continue her metformin. Follow blood sugars. There is no evidence of any diabetic ketoacidosis or any evidence of nonketotic hyperosmolar state. 2.  Hypothyroidism. Continue with Synthroid. 3.  Hypertension. The patient is presently hemodynamically stable. Continue metoprolol. Continue lisinopril. Continue Imdur. 4.  Hyperlipidemia. Continue  simvastatin.  5.  Glaucoma. Continue Xalatan eye drops. 6.  Status post left rotator cuff repair. The patient is having significant pain in that left shoulder. Continue pain control as per orthopedics. The patient may need physical therapy services.  7.  Diabetic neuropathy. Continue Neurontin.  8.  Gastroesophageal reflux disease. Continue with her omeprazole.   Thank you so much for the consultation. We will follow along with you.   TIME SPENT: 45 minutes.   ____________________________ Rolly Pancake. Cherlynn Kaiser, MD vjs:sb D: 02/04/2013 15:34:37 ET T: 02/04/2013 16:09:23 ET JOB#: [id number]  cc: Rolly Pancake. Cherlynn Kaiser, MD, <Dictator> Houston Siren MD ELECTRONICALLY SIGNED 02/04/2013 17:28

## 2014-09-10 NOTE — Consult Note (Signed)
Patient seen and examined. Please see [name] [name]' notes. Painless rectal bleeding with mild anemia. Apparently takes eliquis bid for hx of A fib. Last took dose this AM, which means she cannot have colonoscopy for few more days. If bleeding stops, and hgb remains stable, she can be discharged to home and have colonoscopy arranged as outpt later off eliquis. If not, will need to stay few more days before having colonoscopy scheduled. Thanks.  Electronic Signatures: Lutricia Feil (MD)  (Signed on 28-Dec-15 18:01)  Authored  Last Updated: 28-Dec-15 18:01 by Lutricia Feil (MD)

## 2014-09-10 NOTE — H&P (Signed)
PATIENT NAME:  [name] [name], [name] [name] MR#:  [id number] DATE OF BIRTH:  1935-05-17  DATE OF ADMISSION:  06/10/2013  PRIMARY CARE PHYSICIAN:  Dr. Ronna Polio.   REFERRING PHYSICIAN:  Dr. Manson Passey.   CHIEF COMPLAINT:  Chest pain.   HISTORY OF PRESENT ILLNESS:  [name] [name] is a 80 year old female with history of coronary artery disease status post stent to RCA and angioplasty in 2012 presented to the Emergency Department with complaints of chest pain.  The patient had admission in December 2014.  The patient underwent a stress test which came back to be negative.  The patient was also seen by cardiology at that time.  The patient comes to the Emergency Department with complaints of chest pain under the left breast, sharp in nature, worse with any movement and taking a deep breath.  Also experienced some dizziness.  Work-up in the Emergency Department with EKG and cardiac enzymes were unremarkable.   PAST MEDICAL HISTORY: 1.  Coronary artery status post stent placement.  2.  Hypertension.  3.  Diabetes mellitus.  4.  Hyperlipidemia.  5.  Hypothyroidism.  6.  Diabetic neuropathy.  7.  Glaucoma.  8.  Gastroesophageal reflux disease.   ALLERGIES:  No known drug allergies.   SOCIAL HISTORY:  No history of tobacco, alcohol or drug use.  Lives by herself.   FAMILY HISTORY:  Positive for pancreatic cancer.  Father was murdered.   HOME MEDICATIONS: 1.  Xalatan eye drops.  2.  Vitamin D3 2000 units once a day.  3.  Simvastatin 40 mg once a day. 4.  Phenergan 12.5 mg every six hours as needed.  5.  Potassium chloride 1 tablet 2 times a day.  6.  Omeprazole 20 mg 2 times a day.  7.  Nystatin topical.  8.  Novolin R 40 units 3 times a day.  9.  Nitroglycerin 0.4 mg sublingual as needed.  10.  Meclizine 25 mg once a day.  11.  Lopressor 25 mg 2 times a day.  12.  Lisinopril 10 mg once a day.  13.  Levothyroxine 150 mcg once a day.  14.  Lantus 36 units once a day. 15.  Imdur one tablet 2 times a  day.  16.  Gabapentin 100 mg 3 times a day.  17.  Lasix 40 mg once a day.  18.  Flonase 50 mcg once a day.  19.  Ferrous sulfate 1 tablet once a day.  20.  Flexeril 1 tablet once a day.  21.  Aspirin 81 mg daily.  22.  Amlodipine 1 tablet once a day.    REVIEW OF SYSTEMS:  CONSTITUTIONAL:  Denies any generalized weakness.  EYES:  No change in vision.  EARS, NOSE, THROAT:  No change in hearing.  RESPIRATORY:  No cough, shortness of breath.  CARDIOVASCULAR:  No chest pain, palpitations.  GASTROINTESTINAL:  No nausea, vomiting, abdominal pain.  GENITOURINARY:  No dysuria or hematuria.  ENDOCRINE:  No polyuria or polydipsia.  History of diabetes mellitus.  HEMATOLOGIC:  No easy bruising or bleeding.  SKIN:  No rash or lesions.  MUSCULOSKELETAL:  No joint pains and aches.   PHYSICAL EXAMINATION: GENERAL:  This is a well-built, well-nourished age-appropriate female lying down in the bed not in distress.  VITAL SIGNS:  Temperature 98.8, pulse 60, blood pressure 145/62, respiratory rate of 16, oxygen saturation 95% on room air.  HEENT:  Head normocephalic, atraumatic.  Eyes, no scleral icterus.  Conjunctivae normal.  Pupils equal and react  to light.  Extraocular movements are intact.  Mucous membranes moist.  No pharyngeal erythema.  NECK:  Supple.  No lymphadenopathy.  No JVD.  No carotid bruit.  CHEST:  Has focal tenderness, severe palpable tenderness under the left breast. LUNGS:  Bilateral clear to auscultation.  HEART:  S1, S2 regular.  No murmurs are heard.  ABDOMEN:  Bowel sounds plus.  Soft, nontender, nondistended.  No hepatosplenomegaly.  EXTREMITIES:  No pedal edema.  Pulses 2+.  NEUROLOGIC:  The patient is alert, oriented to place, person and time.  Cranial nerves II through XII intact.  No motor and sensory deficits.   LABORATORY DATA:  BMP is completely within normal limits.  CBC:  WBC of 11.6, hemoglobin 11.5.   Troponin less than 0.02.   Chest x-ray, one view portable:   No acute cardiopulmonary disease.   EKG, 12-lead:  Normal sinus rhythm, no ST-T wave abnormalities.   ASSESSMENT AND PLAN:  [name] [name] is a 80 year old female who comes to the Emergency Department with complaints of chest pain.  1.  Chest pain.  This is a musculoskeletal pain.  Concerning about the patient's previous history of coronary artery disease, we will admit the patient overnight and rule out with cardiac enzymes.  We will consult cardiology in the morning.  Continue the home medications of aspirin, metoprolol and lisinopril.  2.  Diabetes mellitus.  Continue the home dose of medications.  3.  Keep the patient on deep vein thrombosis prophylaxis with Lovenox.   TIME SPENT:  45 minutes.    ____________________________ Susa Griffins, MD pv:ea D: 06/11/2013 02:31:25 ET T: 06/11/2013 02:54:31 ET JOB#: [id number]  cc: Susa Griffins, MD, <Dictator> Ginette Pitman. Dan Humphreys, MD Clerance Lav Heavenly Christine MD ELECTRONICALLY SIGNED 06/13/2013 21:26

## 2014-09-10 NOTE — Consult Note (Signed)
General Aspect [name] [name] is a 80yo Caucasian female w/ PMHx s/f CAD s/p PCI-RCA, DM2, HTN, HLD and morbid obesity who was admitted to [facility] yesterday for atypical chest pain.   She presented very similarly in 04/2013. She was seen by Dr. Antoine Poche at that time. H/o TriStart stent-RCA in 2000, PTCA alone-RCA ISR in 2005 and again in 2008 by Dr. Juanda Chance. Last month, she c/o atypical left breast pain, ruled out and underwent a normal Lexiscan Cardiolite study. Dyspneic at that time, suspected to be related to obesity/deconditioning, possibly underlying pulmonary dysfunction. She followed up w/ Dr. Mariah Milling 05/24/12. No further chest pain. No further cardiac work-up planned.   She was in her USOH until yesterday when she developed onset sharp, L sided, behind her L breast chest pain rated at a 10/10, unresponsive to NTG, and aggravated by deep inspiration and palpation. The chest pain was constant. There was associated dyspnea. She has been "short winded" and unable to take a deep breath. No exertional component. Denies nausea or diaphoresis. She does indicate exertional dyspnea. This is her anginal equivalent and caused her great concern. She thus presented to the ED for further evaluation.   Present Illness There, EKG revealed baseline, unchanged ST depressions V6, I, aVL. Initial TnI WNL. CXR- minimal L basilar atelectasis, borderline cardiomegaly, no acute abnormalities. D-dimer was very mildly elevated at 0.82. No hypoxia, tachycardia, tachypnea. She was admitted for overnight rule out, and two subsequent troponins did return WNL. Since yesterday, she states it has developed more into a pressure. It has never completely subsided.   PAST MEDICAL HISTORY: 1.  Coronary artery status post stent placement.  2.  Hypertension.  3.  Diabetes mellitus.  4.  Hyperlipidemia.  5.  Hypothyroidism.  6.  Diabetic neuropathy.  7.  Glaucoma.  8.  Gastroesophageal reflux disease.   ALLERGIES:  No known drug allergies.    SOCIAL HISTORY:  No history of tobacco, alcohol or drug use.  Lives by herself.   FAMILY HISTORY:  Positive for pancreatic cancer.  Father was murdered.   Physical Exam:  GEN no acute distress, obese, anxious-appearing   HEENT pink conjunctivae, PERRL, hearing intact to voice   NECK supple  No masses  trachea midline  no JVD or bruits   RESP normal resp effort  clear BS  no use of accessory muscles   CARD Regular rate and rhythm  Normal, S1, S2  No murmur  acute tenderness to L chest wall palpation   ABD denies tenderness  soft  normal BS   EXTR negative cyanosis/clubbing, negative edema   SKIN normal to palpation, No rashes   NEURO follows commands, motor/sensory function intact   PSYCH alert, A+O to time, place, person   Review of Systems:  Subjective/Chief Complaint chest pain   Respiratory: Short of breath   Cardiovascular: Chest pain or discomfort   Review of Systems: All other systems were reviewed and found to be negative   Home Medications: Medication Instructions Status  acetaminophen 325 mg oral tablet 1 tab(s) orally every 4 hours, As needed, pain or temp. greater than 100.4 Active  promethazine 12.5 mg oral tablet 1 tab(s) orally every 6 hours, As needed, nausea, vomiting Active  Vitamin D3 2000 intl units tablet 1 tab(s) orally once a day x 30 days Active  levothyroxine 150 mcg (0.15 mg) oral tablet 1 tab(s) orally once a day Active  furosemide 40 mg oral tablet 1 tab(s) orally once a day Active  lisinopril 10 mg  oral tablet 1 tab(s) orally once a day (in the morning) Active  meclizine 25 mg oral tablet 1 tab(s) orally once a day, As Needed Active  omeprazole 20 mg oral delayed release capsule 1 cap(s) orally 2 times a day Active  potassium chloride 20 mEq oral tablet, extended release 1 tab(s) orally 2 times a day Active  cyclobenzaprine 5 mg oral tablet 1 tab(s) orally 3 times a day, As Needed Active  amLODIPine 10 mg oral tablet 1 tab(s) orally once  a day Active  aspirin 81 mg oral tablet 1 tab(s) orally once a day Active  Flonase 50 mcg/inh nasal spray 1 spray(s) nasal once a day, As Needed Active  isosorbide mononitrate 10 mg oral tablet 1 tab(s) orally 2 times a day Active  Xalatan 0.005% ophthalmic solution 1 drop(s) to each affected eye once a day (at bedtime) Active  NovoLIN R human recombinant 100 units/mL injectable solution 4 unit(s) injectable 3 times a day (with meals)  if blood sugar greater than 250 Active  metFORMIN 500 mg oral tablet 1 tab(s) orally 2 times a day Active  nitroglycerin 0.4 mg sublingual tablet 1 tab(s) sublingual every 5 minutes, As Needed Active  nystatin topical 100000 units/g topical cream Apply topically to affected area 2 times a day Active  simvastatin 40 mg oral tablet 1 tab(s) orally once a day (at bedtime) Active  ferrous sulfate 325 mg oral tablet 1 tab(s) orally once a day Active  gabapentin 100 mg oral capsule 1 tab(s) orally 3 times a day Active  Lopressor 50 mg oral tablet 0.5 tab(s) orally 2 times a day Active  Lantus 100 units/mL subcutaneous solution 36 unit(s) subcutaneous once a day (at bedtime) Active   Lab Results:  Cardiac:  22-Jan-15 22:04   CK, Total 49  CPK-MB, Serum 0.9 (Result(s) reported on 11 Jun 2013 at 04:19AM.)  Troponin I < 0.02 (0.00-0.05 0.05 ng/mL or less: NEGATIVE  Repeat testing in 3-6 hrs  if clinically indicated. >0.05 ng/mL: POTENTIAL  MYOCARDIAL INJURY. Repeat  testing in 3-6 hrs if  clinically indicated. NOTE: An increase or decrease  of 30% or more on serial  testing suggests a  clinically important change)  23-Jan-15 03:44   CK, Total 40  CPK-MB, Serum 0.5 (Result(s) reported on 11 Jun 2013 at 04:34AM.)  Troponin I < 0.02 (0.00-0.05 0.05 ng/mL or less: NEGATIVE  Repeat testing in 3-6 hrs  if clinically indicated. >0.05 ng/mL: POTENTIAL  MYOCARDIAL INJURY. Repeat  testing in 3-6 hrs if  clinically indicated. NOTE: An increase or decrease  of  30% or more on serial  testing suggests a  clinically important change)    07:36   CK, Total 43  CPK-MB, Serum 0.8 (Result(s) reported on 11 Jun 2013 at [facility]  Troponin I < 0.02 (0.00-0.05 0.05 ng/mL or less: NEGATIVE  Repeat testing in 3-6 hrs  if clinically indicated. >0.05 ng/mL: POTENTIAL  MYOCARDIAL INJURY. Repeat  testing in 3-6 hrs if  clinically indicated. NOTE: An increase or decrease  of 30% or more on serial  testing suggests a  clinically important change)  Routine Coag:  22-Jan-15 22:04   D-Dimer, Quantitative  0.82 ("If the D-dimer test is being used to assist in the exclusion of DVT and/or PE, note the following:  In various studies concerning the D-dimer methodology (STA Liatest) in use by this laboratory, it has been reported that with a cut-off value of 0.50 ug/mL FEU, the  negative predictive value regarding the exclusion  of thrombosis is within the 95-100% range."  In patients with high pre-test probability of DVT/PE the results of the D-dimer test should be correlated with other diagnostic and clinical assessment modalities. Reference: [employer] 2005.)  Routine Hem:  22-Jan-15 22:04   WBC (CBC)  11.6   EKG:  Interpretation NSR, downsloping ST depression V6, I, aVL   Rate 65   EKG Comparision Not changed from  05/2013 tracing   Radiology Results: XRay:    22-Jan-15 22:19, Chest PA and Lateral  Chest PA and Lateral   REASON FOR EXAM:    CP, SOB  COMMENTS:       PROCEDURE: DXR - DXR CHEST PA (OR AP) AND LATERAL  - Jun 10 2013 10:19PM     CLINICAL DATA:  Left-sided chest pain, shortness of breath and  dizziness.    EXAM:  CHEST  2 VIEW    COMPARISON:  Chest radiograph performed 05/17/2013    FINDINGS:  The lungs are well-aerated. Minimal left-sided atelectasis is noted.  Pulmonary vascularity is at the upper limits of normal. There is no  evidence of pleural effusion or pneumothorax.    The heart is borderline enlarged.  No acute osseous abnormalities are  seen. The patient is status post left-sided rotator cuff repair.     IMPRESSION:  Minimal left-sided atelectasis noted; lungs otherwise clear.  Borderline cardiomegaly.      Electronically Signed    By: Roanna Raider M.D.    On: 06/10/2013 23:02     Verified By: JEFFREY . CHANG, M.D.,    No Known Allergies:   Vital Signs/Nurse's Notes: **Vital Signs.:   23-Jan-15 06:35  Vital Signs Type Pre Medication  Pulse Pulse 67  Systolic BP Systolic BP 128  Diastolic BP (mmHg) Diastolic BP (mmHg) 60  Mean BP 82    Impression 80yo Caucasian female w/ PMHx s/f CAD s/p PCI-RCA, DM2, HTN, HLD and morbid obesity who was admitted to [facility] yesterday for atypical chest pain.   1. Atypical chest pain, exertional dyspnea The patient endorses constant, initially sharp, then dull L sided chest pain aggravated by deep inspiration and palpation rate at a 10/10 unrelieved for several hours since yesterday. No exertional component. No improvement w/ NTG. Recent stress test last month revealed a preserved EF, no WMAs, no ischemia. No exertional discomfort prior to today. She has formally ruled out for MI despite constant cp since yesterday. EKG unchanged from follow-up earlier this month. Of more concern is endorsement of exertional dyspnea since late last year. She is concerned about this as she presented similarly prior to requiring angioplasty in the past. She would like a definitive evaluation as well. She has a h/o of two prior in stent restenoses in the RCA. -- After d/w Dr. Kirke Corin, will plan to pursue diagnostic cardiac catheterization. Will see if this can be performed today. Have made her NPO in anticipation of this.  -- Supportive therapy/NSAIDs post-cath for sharp, likely inflammatory, chest pain  2. CAD s/p PCI-RCA Ruled out overnight. Normal nuclear stress test last month. Atypical chest pain.  -- Continue ASA, ACEi, BB, statin, antianginals -- Diagnostic  cardiac catheterization for ischemic eval  3. Hypertension Well-controlled this AM. -- Continue antihypertensives  4. Hyperlipidemia -- Continue statin. Ensure compliance.   5. DM2 -- Management per primary team   Electronic Signatures for Addendum Section:  Lorine Bears (MD) (Signed Addendum 23-Jan-15 11:57)  The patient was seen and examined. Agree with the above. Known history of CAD  with previous BMS in RCA with resteosis treated with cutting balloon angioplasty in 2008. She presents with sharp pain which seems to be atypical. However, she reports significant exertional dyspnea similar to previous angina. Recent stress test was unremarkable. Given recurrent presentation and significant exertional dyspnea, I think the best option is to proceed with cardiac cath and possible PCI. Risks and benefits were explained.   Electronic Signatures: Gery Pray (PA-C)  (Signed 23-Jan-15 10:00)  Authored: General Aspect/Present Illness, History and Physical Exam, Review of System, Home Medications, Labs, EKG , Radiology, Allergies, Vital Signs/Nurse's Notes, Impression/Plan Lorine Bears (MD)  (Signed 23-Jan-15 11:57)  Co-Signer: General Aspect/Present Illness, History and Physical Exam, Review of System, Home Medications, Labs, EKG , Radiology, Allergies, Vital Signs/Nurse's Notes, Impression/Plan   Last Updated: 23-Jan-15 11:57 by Lorine Bears (MD)

## 2014-09-10 NOTE — Discharge Summary (Signed)
PATIENT NAME:  [name] [name], [name] [name] MR#:  [id number] DATE OF BIRTH:  05-04-1935  DATE OF ADMISSION:  06/11/2013 DATE OF DISCHARGE:  06/12/2013  PRESENTING COMPLAINT: Chest pain.   DISCHARGE DIAGNOSES: 1.  Unstable angina.  2.  Coronary artery disease, status post right coronary artery stent in 2008.  3.  Hypertension.  4.  Hyperlipidemia.   PROCEDURES: Cardiac cath showed RCA 60% in-stent restenosis, LAD 50%, mid ramus is 70% to 80%, proximal OM1 90% ostial but small branch.   CODE STATUS: FULL CODE.    CONDITION ON DISCHARGE: Fair.    CONSULTATIONS: Cardiology with Dr. Kirke Corin.    MEDICATIONS AT DISCHARGE: 1.  Levothyroxine 150 mcg p.o. daily.  2.  Lasix 40 mg daily.  3.  Lisinopril 10 mg daily.  4.  Meclizine 25 mg daily as needed.  5.  Omeprazole 20 mg b.i.d.  6.  K-Dur 20 mEq b.i.d.  7.  Cyclobenzaprine 5 mg 3 times a day as needed.  8.  Amlodipine 10 mg daily.  9.  Aspirin 81 mg daily.  10.  Flonase 50 mcg per inhalation, 1 spray nasally as needed.  11.  Imdur 10 mg b.i.d.  12.  Xalatan 0.005% one drop to affected eye at bedtime.  13.  Vitamin D3 at 2000 international units daily.  14.  Novolin R 4 units 3 times a day with meals.  15.  Metformin 500 mg b.i.d.  16.  Nitroglycerin 0.4 mg sublingual as needed.  17.  Nystatin apply to affected area b.i.d.  18.  Simvastatin 40 mg at bedtime.  19.  Tylenol 325 mg 1 tablet every 4 hours as needed.  20.  Promethazine 12.5 mg every 6 hours as needed for nausea and vomiting.  21.  Ferrous sulfate 325 p.o. daily.  22.  Gabapentin 100 mg 1 tablet 3 times a day.  23.  Lantus 36 units once a day at bedtime.  24.  Lopressor 50 mg 1/2 tablet b.i.d.   DIET: Low-sodium, low-fat, low-cholesterol, carbohydrate-controlled diet.    FOLLOWUP:  1.  Dr. Ronna Polio, primary care physician, in 1 to 2 weeks.  2.  Dr. Kirke Corin as outpatient.   BRIEF SUMMARY OF HOSPITAL COURSE:  [name] is a 80 year old Caucasian female with past  medical history of coronary artery disease, came into the Emergency Room with recurrent chest pain. She was admitted with:  1.  Unstable angina./chest pain. She underwent stress test about a month ago, which was negative; however, due to her recurrent pain and history of stent in the past, the patient underwent cardiac cath by Dr. Kirke Corin, results as above were noted.  Medical management was recommended by Dr. Kirke Corin.  The patient did not have any further chest pain. 2.  CAD, status post RCA stent in 2008. The patient was aspirin, Imdur, ACE inhibitors and beta blockers.   3.  Hyperlipidemia.  On statins.   4.  Diabetes.  The patient was resumed back on his homemeds, along with insulin.   5.  Hypothyroidism.  Continue Synthroid.    Hospital stay otherwise remained stable.  The patient remained a full code.   TIME SPENT:  40 minutes.    ____________________________ Wylie Hail Allena Katz, MD sap:cs D: 06/13/2013 07:05:00 ET T: 06/13/2013 15:40:00 ET JOB#: [id number]  cc: Victorino Dike A. Dan Humphreys, MD Chelsea Aus. Kirke Corin, MD Maymuna Detzel A. Allena Katz, MD, <Dictator>      Willow Ora MD ELECTRONICALLY SIGNED 06/14/2013 15:06

## 2014-09-10 NOTE — Consult Note (Signed)
Chief Complaint:  Subjective/Chief Complaint No further bleeding. RLQ pain. Hgb stable. Off eliquis since 7 AM yesterday.   VITAL SIGNS/ANCILLARY NOTES: **Vital Signs.:   29-Dec-15 13:15  Vital Signs Type Routine  Temperature Temperature (F) 97.9  Celsius 36.6  Temperature Source oral  Pulse Pulse 65  Respirations Respirations 19  Systolic BP Systolic BP 171  Diastolic BP (mmHg) Diastolic BP (mmHg) 75  Mean BP 107  Pulse Ox % Pulse Ox % 91  Pulse Ox Activity Level  With exertion  Oxygen Delivery Room Air/ 21 %   Brief Assessment:  GEN no acute distress   Cardiac Regular   Respiratory clear BS   Gastrointestinal mild RLQ tenderness   Lab Results: Routine Coag:  29-Dec-15 04:24   Prothrombin 13.6  INR 1.1 (INR reference interval applies to patients on anticoagulant therapy. A single INR therapeutic range for coumarins is not optimal for all indications; however, the suggested range for most indications is 2.0 - 3.0. Exceptions to the INR Reference Range may include: Prosthetic heart valves, acute myocardial infarction, prevention of myocardial infarction, and combinations of aspirin and anticoagulant. The need for a higher or lower target INR must be assessed individually. Reference: The Pharmacology and Management of the Vitamin K  antagonists: the seventh ACCP Conference on Antithrombotic and Thrombolytic Therapy. Chest.2004 Sept:126 (3suppl): L7870634. A HCT value >55% may artifactually increase the PT.  In one study,  the increase was an average of 25%. Reference:  "Effect on Routine and Special Coagulation Testing Values of Citrate Anticoagulant Adjustment in Patients with High HCT Values." American Journal of Clinical Pathology 2006;126:400-405.)  Routine Hem:  29-Dec-15 04:24   WBC (CBC) 6.7  RBC (CBC)  3.61  Hemoglobin (CBC)  10.5  Hematocrit (CBC)  31.9  Platelet Count (CBC) 240  MCV 88  MCH 29.1  MCHC 32.9  RDW  16.0  Neutrophil % 54.1   Lymphocyte % 31.7  Monocyte % 10.1  Eosinophil % 3.5  Basophil % 0.6  Neutrophil # 3.6  Lymphocyte # 2.1  Monocyte # 0.7  Eosinophil # 0.2  Basophil # 0.0 (Result(s) reported on 17 May 2014 at 05:19AM.)   Assessment/Plan:  Assessment/Plan:  Assessment LGI bleeding on eliquis. RLQ pain. No prior colonoscopy.   Plan Discussed colonoscopy tomorrow AM 48 hrs off eliquis. If no significant findings, then patient can be discharged by tomorrow afternoon. thanks.   Electronic Signatures: Lutricia Feil (MD)  (Signed 29-Dec-15 13:53)  Authored: Chief Complaint, VITAL SIGNS/ANCILLARY NOTES, Brief Assessment, Lab Results, Assessment/Plan   Last Updated: 29-Dec-15 13:53 by Lutricia Feil (MD)

## 2014-09-10 NOTE — Consult Note (Signed)
PATIENT NAME:  [name] [name], [name] [name] MR#:  [id number] DATE OF BIRTH:  1935-03-27  DATE OF CONSULTATION:  05/16/2014  REFERRING PHYSICIAN:     Sarah "Sallie" Allena Katz, MD CONSULTING PHYSICIAN:  Ezzard Standing. Bluford Kaufmann, MD and Ranae Plumber Arvilla Market, ANP (Adult Nurse Practitioner)  REASON FOR CONSULTATION: Rectal bleeding.   HISTORY OF PRESENT ILLNESS: Ms. Hilmes is a 80 year old Caucasian female with a history of CAD, status post stent to the RCA and angioplasty in 2012, hypertension, diabetes mellitus, hyperlipidemia, hypothyroidism, diabetic neuropathy, GERD, who was admitted to the hospital for bright red rectal bleeding.   The patient reports she developed acute onset Friday of rectal bleeding, filled up the commode with fresh blood. This occurred 2 or 3 times on Friday, Saturday, and once yesterday. The patient saw her primary care provider, Dr. Ronna Polio, today and was advised to come to the hospital for admission. Admitting hemoglobin was 10.9, down from 13 in September 2015. BUN is 19. Troponin negative. The patient denies abdominal pain, except with further questioning, she does report tenderness in left upper and right upper quadrant with deep palpation.   This patient has a history of atrial fibrillation and underwent cardioversion on 04/11/2014. There was no complication. This patient is on Eliquis, 81 mg aspirin, amiodarone, along with her routine medications.  She can recall a remote colonoscopy greater than 10 years ago. Chart review shows she had a colonoscopy in 2005 for hematochezia and it showed multiple smallmouth diverticula in the entire colon. She also had an upper endoscopy at that same time for heartburn with normal findings. She presents on a proton pump inhibitor and denies any upper GI complaints. She denies nonsteroidal anti-inflammatory drug use. She denies eating nuts or seeds. She denies antibiotic exposure. She denies diarrhea or constipation prior to this event. She has had no further  bleeding stools since admission. The patient reports her family doctor expects her to have a colonoscopy during this admission.   PAST MEDICAL HISTORY:  1.  Coronary artery disease, status post stent placement.  2.  Hypertension.  3.  Diabetes mellitus.  4.  Hyperlipidemia.  5.  Hypothyroidism.  6.  Diabetic neuropathy.  7.  Glaucoma.   8.  GERD.    PAST SURGICAL HISTORY:  1.  C-section.  2.  Right knee replacement 2 or 3 years ago.   MEDICATIONS ON ADMISSION: The patient forgot list.   ALLERGIES: NKDA.   HABITS: Negative tobacco or alcohol.   FAMILY HISTORY: The patient states she does not know her family history.   REVIEW OF SYSTEMS: Positive for chronic shortness of breath for a long time. She had a few seconds of chest pain on Saturday. She does have GI symptoms as noted in the history of present illness. Remaining 10 systems otherwise negative.   PHYSICAL EXAMINATION:  VITAL SIGNS: Temperature 98.2, heart rate 47, respirations 18, blood pressure 153/72, pulse oximetry is 96% on room air.  GENERAL: Caucasian female, obese, NAD. The patient is resting in bed in the Emergency Room.  HEENT: Head is normocephalic. Conjunctivae are pale-pink. Sclerae are anicteric. Oral mucosa is moist and intact.  NECK: Supple. Trachea midline.  CARDIAC: S1, S2, bradycardia noted. Heart tones are regular.  LUNGS: CTA. Respirations are nonlabored.  ABDOMEN: Soft, obese, and has tenderness, isolated areas--left upper quadrant and right upper quadrant. No pain in the left lower quadrant or right lower quadrant with palpation.  RECTAL: Deferred.  SKIN: Warm and dry without rash. No edema noted.  EXTREMITIES: Without  edema, cyanosis, or clubbing.  MUSCULOSKELETAL: No joint inflammation or swelling.  NEUROLOGIC: Cranial nerves grossly intact. The patient is alert, oriented. Speech is clear. Movement of extremities x 4.  PSYCHIATRIC: Affect and mood within normal. She is a vague medical historian.    IMPRESSION: The patient presents with acute episode of rectal bleeding that she reports started over the weekend. She could identify no specific cause such as eating nuts or seeds, or recent illness. She did have cardioversion for atrial fibrillation in November. Her medication list consists of Eliquis and aspirin. Those items are currently on hold. The patient had a remote colonoscopy in 2005 that showed extensive diverticulosis. She essentially reports a nonpainful abdomen, but there is isolated tenderness, both left and right, nonspecific on exam. Etiology for her rectal bleeding and drift in hemoglobin to consider diverticular bleed, status post anticoagulation.   PLAN: Close monitoring regarding vital signs, hemoglobin, and monitor for any further events of rectal bleeding. Consideration for colonoscopy will need to be made with her Eliquis exposure and recent cardioversion. The patient has had no further rectal bleeding, and her BUN was not very elevated which is reassuring for lack of aggressive GI hemorrhage. Check CBC,  PT, INR in the a.m., document all stools for color and guaiac.  Consider timing for colonoscopy.   This case was discussed with Dr. Bluford Kaufmann in collaboration of care. Further GI recommendations pending.   Thank you for this consultation.   These services provided by Cala Bradford A. Arvilla Market, MS, APRN, BC, ANP under collaborative agreement with Ezzard Standing. Bluford Kaufmann, MD.   ____________________________ Ranae Plumber Arvilla Market, ANP (Adult Nurse Practitioner) kam:ts D: 05/16/2014 17:45:06 ET T: 05/16/2014 18:18:41 ET JOB#: [id number]  cc: Cala Bradford A. Arvilla Market, ANP (Adult Nurse Practitioner), <Dictator> Ranae Plumber Suzette Battiest, MSN, ANP-BC Adult Nurse Practitioner ELECTRONICALLY SIGNED 05/17/2014 13:09

## 2014-09-10 NOTE — Consult Note (Signed)
Pt has scattered diverticuli and hemorrhoids, which could have been sources of bleeding. No active bleeding anywhere. Nothing abnormal on right side of colon. Can start high fiber diet. If RLQ pain persists, can order CT of abdomen/pelvis later. I will sign off. Thanks.  Electronic Signatures: Lutricia Feil (MD)  (Signed on 30-Dec-15 09:22)  Authored  Last Updated: 30-Dec-15 09:22 by Lutricia Feil (MD)

## 2014-09-10 NOTE — Consult Note (Signed)
PATIENT NAME:  [name] [name], LIBERTO MR#:  [id number] DATE OF BIRTH:  Feb 16, 1935  DATE OF CONSULTATION:  05/18/2013  REFERRING PHYSICIAN:  Ronna Polio, MD. The patient also sees Dossie Arbour. CONSULTING PHYSICIAN:  Dwayne D. Callwood, MD  INDICATION: Chest pain. The patient was seen by me today because Dr. Mariah Milling was out.   HISTORY OF PRESENT ILLNESS: The patient is a 80 year old white female with known history of coronary artery disease, diabetes, hypertension, who presents with chest pain symptoms. Over the past week, directly prior to Christmas, has been having chest pain, chest pressure feeling. The patient states it only lasts a few minutes and then goes and comes. Not relieved or improved by anything in particular. She usually gets a sense of heaviness in the chest. The patient states it is worse, is 8 out of 10. Currently, she is not having any pain when I examined her. She has had some mild shortness of breath with these episodes. Some diaphoresis and radiating to her hands, but she feels fine.   REVIEW OF SYSTEMS:  No blackout spells or syncope.  No nausea or vomiting. Denies fever, chills, sweats. No weight loss. No weight gain. No hemoptysis or hematemesis. Denies bright red blood per rectum.  No vision change or hearing change. Denies sputum production or cough   PAST MEDICAL HISTORY: Coronary artery disease, hypertension, diabetes, hyperlipidemia, hypothyroidism, diabetic neuropathy, glaucoma, GERD.   ALLERGIES: None.   SOCIAL HISTORY:  Denies any smoking or alcohol consumption.   FAMILY HISTORY: Positive for pancreatic cancer. Father was   MEDICATIONS:  Not able to provide a list at this point.   PAST SURGICAL HISTORY: Rotator cuff surgery, knee surgery, hysterectomy, cataract surgery, cardiac stents.   PHYSICAL EXAMINATION: VITAL SIGNS: Blood pressure initially was 200/86, although it is now down to 150/80. Pulse is 70, respiratory rate of 18, afebrile.  HEENT: Normocephalic,  atraumatic. Pupils are equal and reactive to light. NECK:  Supple. No significant JVD, bruits or adenopathy.  LUNGS: Clear to auscultation and percussion.  No significant wheeze, rhonchi or rales.   HEART: Regular rate and rhythm.  No significant murmur, gallops or rubs.  ABDOMEN: Benign.  EXTREMITY: Within normal limits.  NEUROLOGIC:  Intact. SKIN: Normal.   LABORATORIES: Troponin less than 0.2. White count of 9.8, hemoglobin of 12, hematocrit 35.5, platelet count of 289. Sodium 140, potassium 3.8, chloride of 109, bicarb of 24, BUN of 19, creatinine of 0.78, glucose of 179.   EKG: Normal sinus rhythm, nonspecific ST wave changes.   ASSESSMENT:  Unstable angina, malignant hypertension, hyperlipidemia, diabetes, obesity, hypothyroidism, known coronary artery disease.   PLAN: Agree with admit. Rule out for myocardial infarction. Cont cardiac therapy. Continue telemetry, aspirin therapy, beta blocker therapy, nitrates as necessary. We will probably proceed with a functional study. Hopefully, her cardiac enzymes are negative, and if her functional study is negative, we will treat the patient conservatively and medically. If it is positive, we will consider cath if her symptoms persist or worsen.  2.  For malignant hypertension, would agree with continued blood pressure control. She was initially over 200. Recommend metoprolol and Norvasc as necessary. Lisinopril or hydralazine may also be used to help control her blood pressure down to a more reasonable level.  3.  For hyperlipidemia, continue simvastatin as secondary prevention. Follow up fasting lipid profile.  4.  For her diabetes, continue Lantus therapy, as well as sliding scale for now. She should follow her ADA diet as well.  5.  For  hypothyroidism, continue Synthroid therapy.  6.  For obesity, recommend weight loss, portion control and exercise.   Again, we will base further evaluation on followup troponins and whether her functional study  is negative.    ____________________________ Bobbie Stack. Juliann Pares, MD ddc:dmm D: 05/18/2013 18:34:10 ET T: 05/18/2013 20:00:17 ET JOB#: [id number]  cc: Dwayne D. Juliann Pares, MD, <Dictator> Alwyn Pea MD ELECTRONICALLY SIGNED 06/21/2013 9:31

## 2014-09-10 NOTE — Consult Note (Signed)
Pt drank the whole prep last night. No more bleeding. Still with RLQ pain. Will proceed with colonoscopy this AM. thanks.  Electronic Signatures: Lutricia Feil (MD)  (Signed on 30-Dec-15 08:40)  Authored  Last Updated: 30-Dec-15 08:40 by Lutricia Feil (MD)

## 2014-09-11 NOTE — H&P (Signed)
PATIENT NAME:  [name] [name], [name] [name] MR#:  [id number] DATE OF BIRTH:  1935/01/23  DATE OF ADMISSION:  05/17/2011  REFERRED BY:  Kristeen Miss, MD  PRIMARY CARE PHYSICIAN: Dr. Ronna Polio.   CHIEF COMPLAINT: Chest pain since last Thursday as well as shortness of breath.   HISTORY OF PRESENT ILLNESS: The patient is a 80 year old white female with a history of coronary artery disease, hypertension, hypercholesterolemia, diabetes, status post percutaneous transluminal coronary angioplasty and stenting in the past who is also status post balloon re-expansion of her stent for in-stent restenosis who presented to [facility] with  chest heaviness. It was described as an intense heaviness like ton of bricks. The patient reports that these symptoms have been going on for a few days and progressively got worse. She received nitroglycerin in the cardiology office that did not seem to help the symptoms; therefore Dr. Elease Hashimoto called [place], spoke to me, and requested a direct admit but he wanted her on heparin drip and nitroglycerin drip. Therefore, I recommended she come to the ED.  In the ED the patient is still having the chest pressure, was started on a nitroglycerin which is some improvement. She also complains of shortness of breath. She otherwise denies any numbness, tingling, no nausea associated with this. The patient's blood pressure was also noted to be elevated in the doctor's office. She otherwise denies any calf pain. No swelling in the lower extremity.  No nausea, vomiting, diarrhea. No urinary symptoms.   PAST MEDICAL HISTORY:  1. Diabetes type 2.  2. History of atrial flutter.  3. Dyslipidemia.  4. Coronary artery disease with RCA stent in 2000 with restenosis with percutaneous transluminal coronary angioplasty placement in 2005.  5. Dyslipidemia.  6. Osteoarthritis.  7. Hypothyroidism.  8. Rheumatoid arthritis.  9. Morbid obesity.  10. Gastroesophageal reflux disease.   11. Depression.   ALLERGIES: None.   MEDICATIONS:  1. Amlodipine 5 q. daily.  2. Aspirin 81 one tab p.o. q. daily.  3. Keflex 250 q. daily.  4. Lasix 40 two times per day as needed.  5. Vicodin 1 tab p.o. q.6 hours p.r.n. pain.  6. Lantus 40 units at bedtime.  7. Isosorbide mononitrate.  She takes 5 mg b.i.d.  8. Levothyroxine 150 mcg q. daily.  9. Lisinopril 10 q. daily.  10. Meclizine 25 q. daily.  11. Metformin 1000 mg b.i.d. 12. Nabumetone 750 mg 1 tab p.o. daily.  13. Nitroglycerin 0.4 p.r.n.  14. KCl 20 mEq p.o. b.i.d.  15. Simvastatin 80 at bedtime.   PAST SURGICAL HISTORY:  Status post cataract surgery and partial hysterectomy.   ALLERGIES: None.   SOCIAL HISTORY: Never smoked. No alcohol. No drugs.   FAMILY HISTORY: History of esophageal cancer, diabetes in mother.    REVIEW OF SYSTEMS. CONSTITUTIONAL: Denies any fevers. Complains of fatigue, weakness. Complains of chest pressure. No weight loss. No weight gain. EYES: No blurred or double vision. No pain. No redness. No inflammation. No glaucoma. No cataracts. ENT: No tinnitus. No ear pain. No hearing loss. No seasonal or year-round allergies. No nasal discharge. No snoring. No difficulty with swallowing. RESPIRATORY: No cough. No wheezing. No chronic obstructive pulmonary disease. No tuberculosis, pneumonia. CARDIOVASCULAR: Has chest pressure as described above. No orthopnea, no edema. GI:  No nausea, vomiting, diarrhea.  No abdominal pain.  He hematemesis. No melena.  GU: Denies any dysuria or hematuria, renal calculus, frequency or incontinence. ENDOCRINE: Denies any polyuria, nocturia, thyroid problems. No increase in sweating, heat or  cold intolerance or thirst. HEME/LYMPH: Denies any anemia, easy bruisability, bleeding, or swollen glands. SKIN: No acne. No rash. No changes in mole, hair or skin. MUSCULOSKELETAL: No pain in the neck, back, shoulders, or knees.  NEUROLOGIC: No numbness. Does complain of weakness. No  dysarthria. No cerebrovascular accident. No transient ischemic attack. No seizures.   PSYCHIATRIC: No anxiety. No insomnia. No ADD.   VITAL SIGNS: Temperature 97.6, pulse 60, respiratory rate 18, blood pressure 165/70, O2 100%.   GENERAL: The patient is a morbidly obese female currently not in any acute distress.   HEENT: Pupils equally round and reactive to light and accommodation. Head atraumatic, normocephalic. Pupils equally round, reactive to light and accommodation. Extraocular movements intact. No conjunctival pallor. There is no scleral icterus. Oropharynx is without any ulcerations.    NECK: There is no thyromegaly. No carotid bruits.   CARDIOVASCULAR: Regular rate and rhythm. No murmurs, rubs, clicks, or gallops. PMI is not displaced.   LUNGS: Clear to auscultation bilaterally without any rales, rhonchi, or wheezing.   ABDOMEN: Soft, nontender, nondistended. Positive bowel sounds x4.   EXTREMITIES: There is no clubbing, cyanosis, or edema.   NEUROLOGIC: Awake, alert, oriented x3. No focal deficits.   SKIN: There is no rash.   LYMPHATICS: No lymph nodes palpable.   PSYCHIATRIC: Not anxious or depressed.   LABS/STUDIES: BNP was 341.  Glucose 254, BUN 15, creatinine 0.74, sodium 142, potassium 3.9, chloride 107, CO2 is 25, calcium 9.0. LFTs are normal. CPK is 51, CK-MB 1.0. Troponin less than 0.02. WBC 7.1, hemoglobin 12.8, platelet count 257,000. EKG shows normal sinus rhythm, nonspecific ST-T wave changes.   ASSESSMENT AND PLAN: The patient is a 80 year old white female with history of coronary artery disease with stent with in-stent restenosis, hypertension, diabetes, seen by [facility] for chest pain for the past few days, progressively worse who recommends admission, placing the patient on heparin drip, nitroglycerin drip. They will re-evaluate the patient in a.m. for possible stress or catheterization.   1. Chest pain concerning for unstable angina. Her cardiac  enzymes are currently negative. We will check serial cardiac enzymes, continue aspirin, nitroglycerin drip and heparin drip as instructed by Cardiology.  Dr. Mariah Milling will reevaluate him in the morning for a possible stress test or a catheterization.  2. Diabetes type 2. We will place the patient on sliding scale insulin, continue metformin and Lantus.  3. Hypertension. Will continue amlodipine, nitroglycerin, lisinopril.  4. Hyperlipidemia.  We will check a fasting lipid panel, continue simvastatin as taking at home.  5. Hypothyroidism. Continue levothyroxine at 150 mcg q. daily.  6. Morbid obesity, body mass index greater than 40.   CODE STATUS: Full code.   TIME SPENT: 40 minutes.     ____________________________ Lacie Scotts Allena Katz, MD shp:vtd D: 05/17/2011 20:54:53 ET T: 05/18/2011 07:37:26 ET JOB#: [id number]  cc: Arynn Armand H. Allena Katz, MD, <Dictator> Ginette Pitman. Dan Humphreys, MD Charise Carwin MD ELECTRONICALLY SIGNED 05/25/2011 15:19

## 2014-09-11 NOTE — Discharge Summary (Signed)
PATIENT NAME:  [name] [name], [name] [name] MR#:  [id number] DATE OF BIRTH:  1935/03/15  DATE OF ADMISSION:  05/17/2011 DATE OF DISCHARGE:  05/18/2011  ADMITTING DIAGNOSIS:  Chest pain.   DISCHARGE DIAGNOSES:  1. Chest pain of unclear etiology. At this time unlikely acute coronary syndrome.  2. Hypertension, poorly controlled.  3. Hyperlipidemia with LDL of 82. 4. Diabetes mellitus type 2,  insulin-dependent with hemoglobin A1c 9.3.  5. Obesity.  6. Hypothyroidism.  DISCHARGE CONDITION: Stable.   DISCHARGE MEDICATIONS: The patient is to resume her outpatient medications which are:  1. Lasix 40 mg p.o. daily. 2. Nitroglycerin 0.4 mg sublingually as needed.  3. Amlodipine 5 mg p.o. daily.  4. Cephalexin 250 daily mg p.o. daily.  5. Synthroid  150 mcg p.o. daily. 6. Meclizine 25 mg p.o. daily as needed. 7. Metformin 500 mg 2 tablets twice daily.  8. Nabumetone 750 mg p.o. daily.  9. Potassium chloride 20 mEq p.o. twice daily.  10. Simvastatin 80 mg p.o. daily. 11. Vicodin 5/500 one tablet 4 times daily as needed.  ADDITIONAL MEDICATIONS:  1. Lisinopril 10 mg p.o. twice daily; this is a new dose.  2. Insulin Lantus 50 units subcutaneously at bedtime.  3. Isosorbide dinitrate 10 milligrams p.o. twice daily, this is also a new dose. 4. New medication  omeprazole 20 mg p.o. daily.  Home oxygen: None.   DIET: 2 grams salt, 1800 ADA, low fat, low cholesterol.  Physical activity limitations: As tolerated.   Follow-up appointment with Dr. Ronna Polio in two days after discharge as well as Dr. Mariah Milling in two days after discharge.   CONSULTANTS:   [facility], Dr. Hillis Range,  radiologic as well as.  Care management.   RADIOLOGIC STUDIES:  Chest, portable, single view, the 28th of December 2012, no acute disease of chest. Myoview stress test per cardiologist report:  No ischemia was noted according to Dr. Gwen Pounds   HISTORY OF PRESENT ILLNESS: The patient is a 80 year old female  with past medical history significant for history of coronary artery disease, history of diabetes, hyperlipidemia, hypertension who presented to the hospital with complaints of chest pains going on and off for the past 2 or 3 days prior to coming to the hospital. Please refer to Dr. Serita Grit Patel's admission of the 28th of December 2012. On arrival to the Emergency Room, the patient's vitals showed a temperature 97.6, pulse 60, respiration rate 18, blood pressure 165/70, saturation 100% on room air.   PHYSICAL EXAMINATION: Unremarkable.   The patient's EKG showed normal sinus rhythm, nonspecific, ST-T wave changes and no acute changes were noted. The patient's chest x-ray was unremarkable.   LABORATORY DATA:  Glucose was elevated to 254. BNP was 341, otherwise unremarkable BMP. The patient's liver enzymes were normal. Cardiac enzymes, first set, as well as subsequent two more sets, were within normal limits. CBC was  within normal limits. The patient's activated PTT was 26.4 and d-dimer was mildly elevated at 0.621.   ASSESSMENT: 1. The patient was admitted to the hospital because of ongoing pain. She was evaluated by cardiologist who felt that the patient would benefit from stress testing. Stress test was performed on the 29th of December 2012, which was unremarkable by Dr. Philemon Kingdom note. The patient was evaluated by cardiologist, Dr. Johney Frame who discussed the patient's condition with me as well. Dr Johney Frame felt that the patient has history of coronary artery disease; however, on Myoview was low risk as per discussion with Dr. Gwen Pounds. Her EKG did  not show any ischemic changes and  her cardiac enzymes were negative. At this time according to Dr. Johney Frame, the patient was medically stable and she wanted to go home, but she reported also that her discomfort has been worse with eating over the past few days; therefore he Dr .Johney Frame recommended to add PPI at discharge. He also recommended to increase  isosorbide dinitrate to 10 mg twice daily dose, that is why PPI  omeprazole as well as isosorbide was increased. He recommended to continue aspirin therapy as well as medical therapy. In general, he recommended not to start the patient on beta blocker due to the patient being relatively bradycardic with heart rate in 50s and follow up with Dr. Mariah Milling in the next few weeks after discharge. The patient was ambulated prior to discharge and  she did not have any significant discomfort. She was discharged. She is to follow up with her primary physician, Dr. Ronna Polio for further recommendations.  2. In regards to blood pressure issues, The patient's blood pressure was very poorly controlled and the patient's ACE inhibitor was advanced to her current doses. On the day of discharge, the patient's vitals revealed temperature of 98.2, pulse 60, respiration rate 28 ranging from 13 up to 30, oxygen saturation 97% on room air at rest The patient did have slightly abnormal d-dimer, however, because her oxygen saturation remained stable,  decision was made not to CT her chest  at this point; however, if the patient still has tachypnea or hypoxia, especially on exertion, it is recommended to follow up with a CT scan of her chest. 3. Regarding hypertension, the patient's blood pressure medications were advanced. 4. In regards to hyperlipidemia the patient is to continue her outpatient medications. The patient's lipid panel was performed. The patient's LDL was found to be well controlled level of  82. The patient's total cholesterol was found to be 171, triglyceride level was elevated to 209 and HDL 47. The patient was advised to continue low fat, low cholesterol diet and to decrease fatty intake especially in regards to elevated triglycerides level. 5. In regards to diabetes, the patient's hemoglobin A1c was checked and was found to be 9.3. It was felt that the patient's diabetes was very poorly controlled and this was  probably the cause of patient's hypertriglyceridemia. The patient was advised to continue 1800 ADA diet. She may benefit from lifestyle evaluation as an outpatient. Her Lantus was also advanced. She was advised also to continue sliding scale insulin and follow up with her primary care physician for further recommendations.  6. Obesity. The patient's lipid panel as mentioned above, her lipid panel was checked; however, not her TSH.  7. In regards to hypothyroidism as mentioned above, the patient's TSH was not checked. The patient is to continue her Synthroid and follow up with her primary care physician to check a TSH as outpatient and adjust her Synthroid dose as needed. The patient is being discharged in stable condition with the above-mentioned medications and follow-up.      TIME SPENT: 40 minutes.   ____________________________ Katharina Caper, MD rv:ljs D: 05/18/2011 19:27:37 ET T: 05/22/2011 12:31:11 ET JOB#: [id number]  cc: Katharina Caper, MD, <Dictator> Ginette Pitman. Dan Humphreys, MD Katharina Caper MD ELECTRONICALLY SIGNED 06/30/2011 19:37

## 2014-09-12 ENCOUNTER — Other Ambulatory Visit: Payer: Self-pay | Admitting: Internal Medicine

## 2014-09-13 ENCOUNTER — Other Ambulatory Visit: Payer: Self-pay | Admitting: *Deleted

## 2014-09-13 DIAGNOSIS — G629 Polyneuropathy, unspecified: Secondary | ICD-10-CM

## 2014-09-13 MED ORDER — GABAPENTIN 300 MG PO CAPS: 300.0000 mg | ORAL_CAPSULE | Freq: Three times a day (TID) | ORAL | Status: DC

## 2014-09-13 MED ORDER — AMIODARONE HCL 100 MG PO TABS
100.0000 mg | ORAL_TABLET | Freq: Every day | ORAL | Status: DC
Start: 2014-09-13 — End: 2015-05-02

## 2014-09-14 NOTE — H&P (Signed)
PATIENT NAME:  [name] [name], [name] [name] MR#:  [id number] DATE OF BIRTH:  Mar 30, 1935  DATE OF ADMISSION:  05/16/2014  PRIMARY CARE PHYSICIAN: Victorino Dike A. Dan Humphreys, MD  CHIEF COMPLAINT: Rectal bleed for 3 days.  HISTORY OF PRESENT ILLNESS: [name] [name] is a 80 year old Caucasian female well-known to our service from previous admissions, comes to the Emergency Room after she started noticing bright red blood per rectum for past 3 days. The patient went to see Dr. Ronna Polio who asked her to come to the Emergency Room for further evaluation. Her last rectal bleed was today in the morning. She is hemodynamically stable. Her hemoglobin is 10.9. She is being admitted for further evaluation and management.   The patient denies any rectal bleed in the past. She has not had any colonoscopy or esophagogastroduodenoscopy in the remote past.   PAST MEDICAL HISTORY: 1.  Hypothyroidism.  2.  Depression.  3.  Rheumatoid and osteoarthritis. 4.  GERD. 5.  History of atrial flutter.  6.  History of coronary artery disease.   PAST SURGICAL HISTORY: 1.  Partial hysterectomy.  2.  Right knee replacement.  3.  Cataract surgery.  4.  Cardiac stent placement.   ALLERGIES: OXYCODONE.   MEDICATIONS: 1.  Ventolin 2 puffs every 6 hours.  2.  Tramadol 50 mg 2 puffs t.i.d.  3.  Zoloft 50 mg once a day.  4.  Potassium chloride 20 mEq p.o. b.i.d.  5.  Omeprazole 20 mg p.o. b.i.d.  6.  NovoLog according to sliding scale.  7.  Nitroglycerin sublingual as needed.  8.  Metoprolol 25 mg b.i.d.  9.  Metformin 500 mg b.i.d.  10.  Meclizine 25 mg b.i.d.  11.  Lisinopril 10 mg daily.  12.  Levothyroxine 150 mcg p.o. daily.  13.  Latanoprost 1 drop to both eyes once a day.  14.  Lantus 50 units once a day.  15.  Gabapentin 300 mg 1 capsule t.i.d.  16.  Lasix 40 mg b.i.d.  17.  Fluticasone nasal spray 50 mcg 2 puffs nasally once a day.  18.  Ferrous sulfate 325 mg p.o. daily.  19.  Eliquis 5 mg p.o. b.i.d.  20.   Diltiazem 120 mg p.o. daily.  21.  Cyclobenzaprine 5 mg 3 times a day as needed.  22.  Atorvastatin 20 mg daily.  23.  Aspirin 81 mg daily.  24.  Amiodarone 200 mg b.i.d.   SOCIAL HISTORY: She lives at home by herself. No history of tobacco or alcohol use.   FAMILY HISTORY: Positive for pancreatic cancer.   REVIEW OF SYSTEMS:  CONSTITUTIONAL: No fever, fatigue, weakness.  EYES: No blurred or double vision, glaucoma, or cataracts.  ENT: No tinnitus, ear pain, hearing loss, or postnasal drip.  RESPIRATORY: No cough, wheeze, hemoptysis, or dyspnea.  CARDIOVASCULAR: No chest pain, orthopnea, or edema.  GASTROINTESTINAL: Positive for rectal bleed, no melena, nausea, vomiting, or abdominal pain.  GENITOURINARY: No dysuria, hematuria, or frequency.  ENDOCRINE: No polyuria, nocturia, or thyroid problems.  HEMATOLOGY: No anemia or easy bruising or bleeding.  SKIN: No acne, rash, or lesion.  MUSCULOSKELETAL: Positive for arthritis and back pain.  NEUROLOGIC: No CVA, TIA, dementia, or seizures. PSYCHIATRIC: Positive for depression. All other systems reviewed are negative.   PHYSICAL EXAMINATION: GENERAL: The patient is awake, alert, oriented x3, not in acute distress.  VITAL SIGNS: Afebrile, pulse is 47, blood pressure is 139/47. Her sats are 99% on room air.  GENERAL: She is morbidly obese, not in acute  distress.  HEENT: Atraumatic, normocephalic. Pupils are equal, round and reactive to light and accommodation. EOM intact. Oral mucosa is moist.  NECK: Supple. No JVD. No carotid bruit.  LUNGS: Clear to auscultation bilaterally. No rales, rhonchi, respiratory distress, or labored breathing.  HEART: Both the heart sounds are normal. Rate, rhythm regular. PMI not lateralized. Chest nontender.  EXTREMITIES: Good pedal pulses, good femoral pulses. No lower extremity edema.  ABDOMEN: Obese, soft, nontender. No organomegaly. There is no guarding, rigidity, or tenderness. RECTUM: The patient does  have blood in the rectal vault. NEUROLOGIC:cranial nerves 2 through 12. No motor or sensory deficit. No focal deficit.  PSYCHIATRIC: The patient is awake, alert, oriented x3.  SKIN: Warm and dry.   ASSESSMENT: A 80 year old, [name] [name] with history of coronary artery disease, hypertension, hyperlipidemia, hypothyroidism, history of chronic atrial fibrillation, who is on Eliquis, comes in with:  1.  Acute lower gastrointestinal bleed. The patient has been having rectal bleed for the last 3 days. It appears to be painless, likely diverticular bleed. She has never had colonoscopy done in the past. No known history of diverticulosis as well. We will admit the patient to the medical floor. Monitor hemoglobin and hematocrit closely, transfuse as needed. Gastroenterology consultation, Dr. Bluford Kaufmann, has been made aware of the patient's admission. Hold off on Eliquis for now.  2.  Chronic atrial fibrillation, recently underwent cardioversion on 04/11/2014. We will continue her rate blocking agents.  3.  Type 2 diabetes. Hold off on insulin. Keep her on sliding scale insulin.  4.  Hypothyroidism. Continue Synthroid.  5.  Hyperlipidemia, on statins.  6.  Deep vein thrombosis prophylaxis, sequential compression devices and thromboembolic disease stockings. Avoid antiplatelet agent due to rectal bleed. discussed with the patient. No family members were present. The patient is a FULL CODE.  TIME SPENT:  50 minutes.   ____________________________ Wylie Hail Allena Katz, MD sap:sw D: 05/16/2014 15:38:49 ET T: 05/16/2014 18:31:44 ET JOB#: [id number]  cc: Aiyannah Fayad A. Allena Katz, MD, <Dictator> Ginette Pitman. Dan Humphreys, MD Willow Ora MD ELECTRONICALLY SIGNED 05/24/2014 11:09

## 2014-09-14 NOTE — Discharge Summary (Signed)
Dates of Admission and Diagnosis:  Date of Admission 16-May-2014   Date of Discharge 18-May-2014   Admitting Diagnosis Lower GI bleed   Final Diagnosis Lower GI bleed- likely diverticular. hypertension Atreal fibrilation    Chief Complaint/History of Present Illness a 80 year old Caucasian female well-known to our service from previous admissions, comes to the Emergency Room after she started noticing bright red blood per rectum for past 3 days. The patient went to see Dr. Ronna Polio who asked her to come to the Emergency Room for further evaluation. Her last rectal bleed was today in the morning. She is hemodynamically stable. Her hemoglobin is 10.9. She is being admitted for further evaluation and management.   The patient denies any rectal bleed in the past. She has not had any colonoscopy or esophagogastroduodenoscopy in the remote past.   Allergies:  Oxycodone: N/V/Diarrhea  Pertinent Past History:  Pertinent Past History 1.  Hypothyroidism.  2.  Depression.  3.  Rheumatoid and osteoarthritis. 4.  GERD. 5.  History of atrial flutter.  6.  History of coronary artery disease.   Hospital Course:  Hospital Course A 80 year old, [name] [name] with history of coronary artery disease, hypertension, hyperlipidemia, hypothyroidism, history of chronic atrial fibrillation, who is on Eliquis, comes in with:  1.  Acute lower gastrointestinal bleed.   rectal bleed for the last 3 days. It appears to be painless, likely diverticular bleed. She has never had colonoscopy done in the past. No known history of diverticulosis as well. stable hemoglobin and hematocrit closely, transfuse as needed. appreciated Gastroenterology consultation, colonoscopy - multiple diverticuli. no active bleed.    no more pain- discharge home today.  2.  Chronic atrial fibrillation, recently underwent cardioversion on 04/11/2014. We will continue her rate blocking agents. resume eliquis. 3.  Type 2 diabetes. Hold  off on insulin. Keep her on sliding scale insulin.  4.  Hypothyroidism. Continue Synthroid.  5.  Hyperlipidemia, on statins.  6.  Deep vein thrombosis prophylaxis, sequential compression devices and thromboembolic disease stockings. Avoid antiplatelet agent due to rectal bleed. discharge home today.   Condition on Discharge Stable   DISCHARGE INSTRUCTIONS HOME MEDS:  Medication Reconciliation: Patient's Home Medications at Discharge:     Medication Instructions  omeprazole 20 mg oral delayed release capsule  1 cap(s) orally 2 times a day   potassium chloride 20 meq oral tablet, extended release  1 tab(s) orally 2 times a day   cyclobenzaprine 5 mg oral tablet  1 tab(s) orally 3 times a day, As Needed - for Pain   metformin 500 mg oral tablet  1 tab(s) orally 2 times a day   nitroglycerin 0.4 mg sublingual tablet  1 tab(s) sublingual every 5 minutes, As Needed - for Chest Pain   ventolin hfa cfc free 90 mcg/inh inhalation aerosol  2 puff(s) inhaled every 6 hours, As Needed - for Wheezing, for Shortness of Breath    furosemide 40 mg oral tablet  1 tab(s) orally 2 times a day   gabapentin 300 mg oral capsule  1 cap(s) orally 3 times a day   lantus 100 units/ml subcutaneous solution  50 unit(s) subcutaneous once a day (at bedtime)   meclizine 25 mg oral tablet  1 tab(s) orally 2 times a day as needed for dizziness or nausea.   metoprolol tartrate 25 mg oral tablet  1 tab(s) orally 2 times a day, As Needed depending on blood pressure.    tramadol 50 mg oral tablet  2 tab(s) orally every  8 hours, As Needed - for Pain   eliquis 5 mg oral tablet  1 tab(s) orally 2 times a day   aspirin 81 mg oral tablet  1 tab(s) orally once a day (in the morning)   atorvastatin 20 mg oral tablet  1 tab(s) orally once a day (in the morning)   amiodarone 200 mg oral tablet  1 tab(s) orally 2 times a day   diltiazem 120 mg/24 hours oral capsule, extended release  1 cap(s) orally once a day (in the morning)    ferrous sulfate 325 mg oral tablet  1 tab(s) orally once a day (in the morning)   fluticasone nasal 50 mcg/inh nasal spray  2 spray(s) nasal once a day (in the morning)   novolog 100 units/ml subcutaneous solution  unit(s) subcutaneous 3 times a day (before meals) per sliding scale:    2 units  4 units  6 units  8 units or above:  10 units    latanoprost ophthalmic 0.005% ophthalmic solution  1 drop(s) to both eyes once a day (at bedtime)   levothyroxine 150 mcg (0.15 mg) oral tablet  1 tab(s) orally once a day (in the morning)   lisinopril 10 mg oral tablet  1 tab(s) orally once a day (in the morning)   sertraline 50 mg oral tablet  1 tab(s) orally once a day (in the morning)     Physician's Instructions:  Diet Low Sodium  Low Fat, Low Cholesterol  Carbohydrate Controlled (ADA) Diet   Activity Limitations As tolerated   Return to Work Not Applicable   Time frame for Follow Up Appointment 1-2 weeks  PMD     Dan Humphreys, Jennifer(Family Physician): [facility], [address], [place], [place] [address], [place] [phone]  TIME SPENT:  Total Time: Greater than 30 minutes   Electronic Signatures: Altamese Dilling (MD)  (Signed 01-Jan-16 09:04)  Authored: ADMISSION DATE AND DIAGNOSIS, CHIEF COMPLAINT/HPI, Allergies, PERTINENT PAST HISTORY, HOSPITAL COURSE, DISCHARGE INSTRUCTIONS HOME MEDS, PATIENT INSTRUCTIONS, Follow Up Physician, TIME SPENT   Last Updated: 01-Jan-16 09:04 by Altamese Dilling (MD)

## 2014-09-23 ENCOUNTER — Telehealth: Payer: Self-pay | Admitting: Internal Medicine

## 2014-09-23 NOTE — Telephone Encounter (Signed)
Pt states that she has had sinus problems going on for a week now and feels like it is getting better.  Offered to see if there were any available appts at the [place] office since Dr Dan Humphreys is out of the office, Pt declined.  Pt states that she will call the office Monday morning if she feels that she still needs to be seen.  Advised pt to see urgent care over the weekend if her symptoms worsen or she doesn't feel any better.

## 2014-09-23 NOTE — Telephone Encounter (Signed)
Patient Name: [name] [name]  DOB: Jun 29, 1934    Initial Comment Caller says that her eye is swollen   Nurse Assessment  Nurse: Sherilyn Cooter, RN, Thurmond Butts Date/Time Lamount Cohen Time): 09/23/2014 11:16:37 AM  Confirm and document reason for call. If symptomatic, describe symptoms. ---Caller states that her eyes are swollen beginning a week ago. It has not changed or got better. Sandoz eye drops were prescribed about 2 months ago. She has been using them but it hasn't helped. Her eyelashes have been crusted over in the mornings. The eyelids are not red in color. Denies fever. It is itchy but not painful.  Has the patient traveled out of the country within the last 30 days? ---No  Does the patient require triage? ---Yes  Related visit to physician within the last 2 weeks? ---No  Does the PT have any chronic conditions? (i.e. diabetes, asthma, etc.) ---Yes  List chronic conditions. ---Asthma, Diabetes     Guidelines    Guideline Title Affirmed Question Affirmed Notes       Final Disposition User        Comments  Caller did not wish to continue with the assessment, states she is making herself breakfast. She asked if I could call her something in and I advised her she would probably need to see the doctor about this. She verbalized understanding and disconnected the call.

## 2014-09-27 ENCOUNTER — Ambulatory Visit (INDEPENDENT_AMBULATORY_CARE_PROVIDER_SITE_OTHER): Payer: Commercial Managed Care - HMO | Admitting: Internal Medicine

## 2014-09-27 ENCOUNTER — Encounter: Payer: Self-pay | Admitting: Internal Medicine

## 2014-09-27 VITALS — BP 146/64 | HR 76 | Temp 98.1°F | Ht 63.0 in | Wt 228.4 lb

## 2014-09-27 DIAGNOSIS — H109 Unspecified conjunctivitis: Secondary | ICD-10-CM

## 2014-09-27 MED ORDER — POLYMYXIN B-TRIMETHOPRIM 10000-0.1 UNIT/ML-% OP SOLN: 1.0000 [drp] | OPHTHALMIC | Status: DC

## 2014-09-27 NOTE — Assessment & Plan Note (Signed)
Exam c/w conjunctivitis. Will start topical Polytrim. Follow up if symptoms are not improving this week. Eye exam scheduled for next week.

## 2014-09-27 NOTE — Progress Notes (Signed)
   Subjective:    Patient ID: [name] [name], female    DOB: 03-Nov-1934, 80 y.o.   MRN: [id number]  HPI  80YO female presents for acute visit.  Left eye purulent drainage for 2 weeks. No pain. Some "puffiness" noted initially. No fever. Not taking anything for this. Some blurred vision in left eye.  Past medical, surgical, family and social history per today's encounter.  Review of Systems  Constitutional: Negative for fever, chills and unexpected weight change.  HENT: Negative for congestion, ear discharge, ear pain, facial swelling, hearing loss, mouth sores, nosebleeds, postnasal drip, rhinorrhea, sinus pressure, sneezing, sore throat, tinnitus, trouble swallowing and voice change.   Eyes: Positive for discharge, redness and visual disturbance. Negative for photophobia and pain.  Respiratory: Negative for cough, chest tightness, shortness of breath, wheezing and stridor.   Cardiovascular: Negative for chest pain, palpitations and leg swelling.  Musculoskeletal: Negative for myalgias, arthralgias, neck pain and neck stiffness.  Skin: Negative for color change and rash.  Neurological: Negative for dizziness, weakness, light-headedness and headaches.  Hematological: Negative for adenopathy.       Objective:    BP 146/64 mmHg  Pulse 76  Temp(Src) 98.1 F (36.7 C) (Oral)  Ht 5\' 3"  (1.6 m)  Wt 228 lb 6 oz (103.59 kg)  BMI 40.46 kg/m2  SpO2 96% Physical Exam  Constitutional: She is oriented to person, place, and time. She appears well-developed and well-nourished. No distress.  HENT:  Head: Normocephalic and atraumatic.  Right Ear: External ear normal.  Left Ear: External ear normal.  Nose: Nose normal.  Mouth/Throat: Oropharynx is clear and moist.  Eyes: EOM are normal. Pupils are equal, round, and reactive to light. Right eye exhibits discharge. Left eye exhibits discharge and exudate. Left conjunctiva is injected. No scleral icterus.  Neck: Normal range of motion. Neck  supple. No tracheal deviation present. No thyromegaly present.  Pulmonary/Chest: Effort normal. No accessory muscle usage. No tachypnea. She has no decreased breath sounds. She has no rhonchi.  Musculoskeletal: Normal range of motion. She exhibits no edema or tenderness.  Lymphadenopathy:    She has no cervical adenopathy.  Neurological: She is alert and oriented to person, place, and time. No cranial nerve deficit. She exhibits normal muscle tone. Coordination normal.  Skin: Skin is warm and dry. No rash noted. She is not diaphoretic. No erythema. No pallor.  Psychiatric: She has a normal mood and affect. Her behavior is normal. Judgment and thought content normal.          Assessment & Plan:   Problem List Items Addressed This Visit    None       No Follow-up on file.

## 2014-09-27 NOTE — Progress Notes (Signed)
Pre visit review using our clinic review tool, if applicable. No additional management support is needed unless otherwise documented below in the visit note. 

## 2014-09-27 NOTE — Patient Instructions (Signed)

## 2014-10-02 ENCOUNTER — Other Ambulatory Visit: Payer: Self-pay | Admitting: Internal Medicine

## 2014-10-27 ENCOUNTER — Telehealth: Payer: Self-pay | Admitting: *Deleted

## 2014-10-27 NOTE — Telephone Encounter (Signed)
Can she ask her pharmacist if Levemir would be less expensive for her?

## 2014-10-27 NOTE — Telephone Encounter (Signed)
Pt called states Lantus is too expensive.  Pt is requesting a different medication.  Please advise

## 2014-11-02 ENCOUNTER — Other Ambulatory Visit: Payer: Self-pay | Admitting: Internal Medicine

## 2014-11-03 ENCOUNTER — Ambulatory Visit (INDEPENDENT_AMBULATORY_CARE_PROVIDER_SITE_OTHER): Payer: Commercial Managed Care - HMO | Admitting: Cardiovascular Disease

## 2014-11-03 ENCOUNTER — Encounter: Payer: Self-pay | Admitting: Cardiovascular Disease

## 2014-11-03 VITALS — BP 120/56 | HR 75 | Ht 63.0 in | Wt 218.2 lb

## 2014-11-03 DIAGNOSIS — E1159 Type 2 diabetes mellitus with other circulatory complications: Secondary | ICD-10-CM

## 2014-11-03 DIAGNOSIS — E1165 Type 2 diabetes mellitus with hyperglycemia: Secondary | ICD-10-CM

## 2014-11-03 DIAGNOSIS — E1151 Type 2 diabetes mellitus with diabetic peripheral angiopathy without gangrene: Secondary | ICD-10-CM | POA: Diagnosis not present

## 2014-11-03 DIAGNOSIS — I25119 Atherosclerotic heart disease of native coronary artery with unspecified angina pectoris: Secondary | ICD-10-CM

## 2014-11-03 DIAGNOSIS — E785 Hyperlipidemia, unspecified: Secondary | ICD-10-CM

## 2014-11-03 DIAGNOSIS — R0602 Shortness of breath: Secondary | ICD-10-CM

## 2014-11-03 DIAGNOSIS — I158 Other secondary hypertension: Secondary | ICD-10-CM

## 2014-11-03 DIAGNOSIS — I4891 Unspecified atrial fibrillation: Secondary | ICD-10-CM

## 2014-11-03 DIAGNOSIS — R001 Bradycardia, unspecified: Secondary | ICD-10-CM

## 2014-11-03 NOTE — Assessment & Plan Note (Signed)
She has been stable without calcium channel blocker or beta blocker. These were held previously for bradycardia

## 2014-11-03 NOTE — Assessment & Plan Note (Signed)
Blood pressure is well controlled on today's visit. No changes made to the medications. 

## 2014-11-03 NOTE — Progress Notes (Signed)
Patient ID: [name] [name], female    DOB: November 10, 1934, 80 y.o.   MRN: [id number]  HPI Comments: [name] [name] is a 80 year old woman with a history of CAD, status post PTCA/cutting balloon angioplasty for in-stent restenosis of the RCA,  Who presents for routine followup of her atrial fibrillation history of total right knee replacement,  also history of spinal stenosis.  Found to be in atrial fibrillation starting between September 14 and February 06 2014.  in the hospital 02/06/2014 with dizziness, general weakness, nausea, blood sugars 307 CT scan of the head showed old stroke, incidental meningioma   In follow-up today, she reports that she is doing well. Her main complaint is that her anticoagulation is very expensive. She is in the donut hole pain more than $100 for eliquis. She's wondering if there is any alternatives She denies any leg edema, denies any tachycardia or palpitations. She reports that she is losing weight, down at least 10 pounds by eating better Otherwise feels well. She continues on Lasix daily  EKG on today's visit shows normal sinus rhythm with rate 75 bpm, no significant ST or T-wave changes  Other past medical history  cardioversion 04/11/2014 which was successful.  chronic left osteoarthritis knee pain.  admitted to the hospital at the end of May 17 2013 with discharge 05/18/2013 with diagnosis of chest pain. Cardiac enzymes were negative. She had stress test that showed no ischemia.   Repeat hospital admission 06/12/2011 with discharge 06/12/2013. Admitted with chest pain. She had cardiac catheterization that showed 60% mid RCA disease, FFR performed with the results of 0.86, no intervention done. Also with 75% ramus disease, 90% small OM disease. Medical management was recommended  Prior lab work; Total cholesterol 170, LDL 96.    episode of chest pain in 2012, admitted to the hospital, workup including cardiac enzymes and a stress test. Stress test  showed no ischemia and she was discharged home She has significant orthopedic problems. She had a fall in the recent past, broke her left shoulder. Scheduled for surgery on September 18.  Previous Hemoglobin A1c 8.3. After cortisone shot     Allergies  Allergen Reactions  . Oxycodone Nausea And Vomiting    Outpatient Encounter Prescriptions as of 11/03/2014  Medication Sig  . albuterol (PROVENTIL HFA;VENTOLIN HFA) 108 (90 BASE) MCG/ACT inhaler Inhale 2 puffs into the lungs every 6 (six) hours as needed for wheezing or shortness of breath.  Marland Kitchen amiodarone (PACERONE) 100 MG tablet Take 1 tablet (100 mg total) by mouth daily.  Marland Kitchen amLODipine (NORVASC) 10 MG tablet TAKE 1 TABLET EVERY DAY  . apixaban (ELIQUIS) 5 MG TABS tablet TAKE 1 TABLET BY MOUTH 2 TIMES A DAY.  Marland Kitchen atorvastatin (LIPITOR) 20 MG tablet TAKE 1 TABLET DAILY  . cyclobenzaprine (FLEXERIL) 5 MG tablet Take 1 tablet (5 mg total) by mouth 3 (three) times daily as needed.  . ferrous sulfate 325 (65 FE) MG tablet Take 325 mg by mouth daily with breakfast.  . fluticasone (FLONASE) 50 MCG/ACT nasal spray Place 2 sprays into the nose daily.  . furosemide (LASIX) 40 MG tablet Take 1 tablet (40 mg total) by mouth daily.  Marland Kitchen gabapentin (NEURONTIN) 300 MG capsule Take 1 capsule (300 mg total) by mouth 3 (three) times daily.  . insulin aspart (NOVOLOG) 100 UNIT/ML injection Before each meal 3 times a day, 140-199 - 2 units, 200-250 - 4 units, 251-299 - 6 units,  300-349 - 8 units,  350 or above 10 units.  Insulin PEN if approved, provide syringes and needles if needed.  . insulin glargine (LANTUS) 100 UNIT/ML injection Inject 0.5 mLs (50 Units total) into the skin at bedtime.  Marland Kitchen latanoprost (XALATAN) 0.005 % ophthalmic solution Place 1 drop into both eyes at bedtime.   Marland Kitchen levothyroxine (SYNTHROID, LEVOTHROID) 150 MCG tablet TAKE 1 TABLET EVERY DAY  . lisinopril (PRINIVIL,ZESTRIL) 10 MG tablet TAKE 1 TABLET EVERY DAY  . meclizine (ANTIVERT) 25 MG  tablet Take 1 tablet (25 mg total) by mouth 2 (two) times daily. As needed for dizziness or nausea (Patient taking differently: Take 25 mg by mouth 2 (two) times daily as needed. As needed for dizziness or nausea)  . metFORMIN (GLUCOPHAGE) 500 MG tablet Take 1 tablet (500 mg total) by mouth 2 (two) times daily.  . nitroGLYCERIN (NITROSTAT) 0.4 MG SL tablet Place 1 tablet (0.4 mg total) under the tongue every 5 (five) minutes as needed.  Marland Kitchen NOVOLIN R RELION 100 UNIT/ML injection INJECT FOUR UNITS SUBCUTANEOUSLY IF BLOOD SUGAR IS GREATER THAN 250  . omeprazole (PRILOSEC) 20 MG capsule Take 1 capsule (20 mg total) by mouth 2 (two) times daily.  . potassium chloride SA (K-DUR,KLOR-CON) 20 MEQ tablet Take 1 tablet (20 mEq total) by mouth daily.  . sertraline (ZOLOFT) 50 MG tablet Take 1 tablet (50 mg total) by mouth daily.  . traMADol (ULTRAM) 50 MG tablet Take 2 tablets (100 mg total) by mouth every 8 (eight) hours as needed for moderate pain.  Marland Kitchen trimethoprim-polymyxin b (POLYTRIM) ophthalmic solution Place 1 drop into both eyes every 4 (four) hours.   No facility-administered encounter medications on file as of 11/03/2014.    Past Medical History  Diagnosis Date  . DM (diabetes mellitus)   . PAF (paroxysmal atrial fibrillation)     a. s/p dccv 03/2014;  b. amio/eliquis;  c. pt d/c'd amio 05/2014;  d. 06/2013 bb/ccb d/c 2/2 symptomatic bradycardia.  . Dyslipidemia   . CAD (coronary artery disease)     a. s/p prior PCI RCA with ISR req CBA;  b. 04/2013 Neg MV;  c. 05/2013 Cath: LM 20, LAD 30p, LCX 20p, OM1 90 small, RCA 76m, PDA 40-->Med Rx.  . Osteoarthritis   . Hypothyroidism     hx  . Rheumatoid arthritis(714.0)   . Obesity   . GERD (gastroesophageal reflux disease)   . Depression   . Shoulder fracture, left     Dr. Martha Clan  . Cerebral infarction   . CKD (chronic kidney disease), stage III     Past Surgical History  Procedure Laterality Date  . Rca stent placement  2000  . Restenosis  with ptca placement  2005  . Cataract surgery    . Partial hysterectomy    . Replacement total knee  2013    right  . Shoulder surgery  02/04/13  . Cardiac catheterization  06/11/2013  . Coronary angioplasty      Social History  reports that she has never smoked. She has never used smokeless tobacco. She reports that she does not drink alcohol or use illicit drugs.  Family History family history includes Cancer in her mother; Diabetes in her mother; Esophageal cancer in her mother.   Review of Systems  Constitutional: Negative.   Respiratory: Negative.   Cardiovascular: Negative.   Gastrointestinal: Negative.   Musculoskeletal: Positive for back pain, arthralgias and gait problem.  Neurological: Negative.   Hematological: Negative.   Psychiatric/Behavioral: Negative.   All other systems reviewed and are negative.  BP 120/56 mmHg  Pulse 75  Ht 5\' 3"  (1.6 m)  Wt 218 lb 4 oz (98.998 kg)  BMI 38.67 kg/m2  Physical Exam  Constitutional: She is oriented to person, place, and time. She appears well-developed and well-nourished.  obese  HENT:  Head: Normocephalic.  Nose: Nose normal.  Mouth/Throat: Oropharynx is clear and moist.  Eyes: Conjunctivae are normal. Pupils are equal, round, and reactive to light.  Neck: Normal range of motion. Neck supple. No JVD present.  Cardiovascular: Normal rate, S1 normal, S2 normal, normal heart sounds and intact distal pulses.  An irregularly irregular rhythm present. Exam reveals no gallop and no friction rub.   No murmur heard. Pulmonary/Chest: Effort normal and breath sounds normal. No respiratory distress. She has no wheezes. She has no rales. She exhibits no tenderness.  Abdominal: Soft. Bowel sounds are normal. She exhibits no distension. There is no tenderness.  Musculoskeletal: Normal range of motion. She exhibits no edema or tenderness.  Lymphadenopathy:    She has no cervical adenopathy.  Neurological: She is alert and oriented  to person, place, and time. Coordination normal.  Skin: Skin is warm and dry. No rash noted. No erythema.  Psychiatric: She has a normal mood and affect. Her behavior is normal. Judgment and thought content normal.    Assessment and Plan  Nursing note and vitals reviewed.

## 2014-11-03 NOTE — Assessment & Plan Note (Signed)
Maintaining normal sinus rhythm.  Samples provided of her eliquis  Also provided her with patient assistance forms for all anticoagulation medications available

## 2014-11-03 NOTE — Assessment & Plan Note (Signed)
Currently with no symptoms of angina. No further workup at this time. Continue current medication regimen.  she will hold the aspirin as she is on eliquis

## 2014-11-03 NOTE — Patient Instructions (Signed)
You are doing well. No medication changes were made.  Ask the pharmacy about : Xarelto 20 mg once a day Pradaxa 150 mg twice a day  Please call us if you have new issues that need to be addressed before your next appt.  Your physician wants you to follow-up in: 6 months.  You will receive a reminder letter in the mail two months in advance. If you don't receive a letter, please call our office to schedule the follow-up appointment.

## 2014-11-03 NOTE — Assessment & Plan Note (Signed)
Cholesterol is at goal on the current lipid regimen. No changes to the medications were made.  

## 2014-11-11 NOTE — Telephone Encounter (Signed)
Informed pt that Dr Dan Humphreys wanted her to ask pharmacist if Levamir would be cheaper for her.

## 2014-11-15 ENCOUNTER — Ambulatory Visit: Payer: Commercial Managed Care - HMO | Admitting: Family

## 2014-11-15 ENCOUNTER — Other Ambulatory Visit (HOSPITAL_COMMUNITY): Payer: Commercial Managed Care - HMO

## 2014-11-18 ENCOUNTER — Encounter: Payer: Self-pay | Admitting: Family

## 2014-11-18 ENCOUNTER — Other Ambulatory Visit: Payer: Self-pay | Admitting: *Deleted

## 2014-11-18 MED ORDER — AMLODIPINE BESYLATE 10 MG PO TABS: 10.0000 mg | ORAL_TABLET | Freq: Every day | ORAL | Status: DC

## 2014-11-22 ENCOUNTER — Other Ambulatory Visit: Payer: Self-pay | Admitting: *Deleted

## 2014-11-22 ENCOUNTER — Other Ambulatory Visit: Payer: Self-pay | Admitting: Internal Medicine

## 2014-11-22 MED ORDER — LEVOTHYROXINE SODIUM 150 MCG PO TABS: 150.0000 ug | ORAL_TABLET | Freq: Every day | ORAL | Status: DC

## 2014-11-24 ENCOUNTER — Encounter (HOSPITAL_COMMUNITY): Payer: Commercial Managed Care - HMO

## 2014-11-24 ENCOUNTER — Ambulatory Visit: Payer: Commercial Managed Care - HMO | Admitting: Family

## 2014-11-29 ENCOUNTER — Ambulatory Visit: Payer: Commercial Managed Care - HMO | Admitting: Internal Medicine

## 2014-12-01 ENCOUNTER — Ambulatory Visit (INDEPENDENT_AMBULATORY_CARE_PROVIDER_SITE_OTHER): Payer: Commercial Managed Care - HMO | Admitting: Internal Medicine

## 2014-12-01 ENCOUNTER — Encounter: Payer: Self-pay | Admitting: Internal Medicine

## 2014-12-01 VITALS — BP 114/52 | HR 68 | Temp 98.0°F | Ht 63.0 in | Wt 211.5 lb

## 2014-12-01 DIAGNOSIS — I158 Other secondary hypertension: Secondary | ICD-10-CM

## 2014-12-01 DIAGNOSIS — IMO0002 Reserved for concepts with insufficient information to code with codable children: Secondary | ICD-10-CM

## 2014-12-01 DIAGNOSIS — E1165 Type 2 diabetes mellitus with hyperglycemia: Secondary | ICD-10-CM | POA: Diagnosis not present

## 2014-12-01 DIAGNOSIS — N183 Chronic kidney disease, stage 3 unspecified: Secondary | ICD-10-CM

## 2014-12-01 DIAGNOSIS — E785 Hyperlipidemia, unspecified: Secondary | ICD-10-CM

## 2014-12-01 DIAGNOSIS — I480 Paroxysmal atrial fibrillation: Secondary | ICD-10-CM

## 2014-12-01 LAB — COMPREHENSIVE METABOLIC PANEL
ALBUMIN: 3.8 g/dL (ref 3.5–5.2)
ALT: 12 U/L (ref 0–35)
AST: 14 U/L (ref 0–37)
Alkaline Phosphatase: 73 U/L (ref 39–117)
BILIRUBIN TOTAL: 0.6 mg/dL (ref 0.2–1.2)
BUN: 21 mg/dL (ref 6–23)
CHLORIDE: 104 meq/L (ref 96–112)
CO2: 26 meq/L (ref 19–32)
Calcium: 9.7 mg/dL (ref 8.4–10.5)
Creatinine, Ser: 1.23 mg/dL — ABNORMAL HIGH (ref 0.40–1.20)
GFR: 44.93 mL/min — ABNORMAL LOW (ref >60.00)
Glucose, Bld: 216 mg/dL — ABNORMAL HIGH (ref 70–99)
Potassium: 3.8 mEq/L (ref 3.5–5.1)
SODIUM: 140 meq/L (ref 135–145)
TOTAL PROTEIN: 7.2 g/dL (ref 6.0–8.3)

## 2014-12-01 LAB — LIPID PANEL
CHOLESTEROL: 127 mg/dL (ref 0–200)
HDL: 39 mg/dL — ABNORMAL LOW (ref >39.00)
LDL Cholesterol: 59 mg/dL (ref 0–99)
NonHDL: 88
Total CHOL/HDL Ratio: 3
Triglycerides: 147 mg/dL (ref 0.0–149.0)
VLDL: 29.4 mg/dL (ref 0.0–40.0)

## 2014-12-01 LAB — HEMOGLOBIN A1C: HEMOGLOBIN A1C: 7.8 % — AB (ref 4.6–6.5)

## 2014-12-01 NOTE — Assessment & Plan Note (Signed)
BP Readings from Last 3 Encounters:  12/01/14 114/52  11/03/14 120/56  09/27/14 146/64   BP well controlled. Renal function with labs today.

## 2014-12-01 NOTE — Assessment & Plan Note (Signed)
BG variable and compliance with medication is poor. Lantus reportedly expensive for her, however Preferred drug for her insurance. Will set up [facility] referral and [facility]. Check A1c with labs today.

## 2014-12-01 NOTE — Assessment & Plan Note (Signed)
NSR on exam today. Plan to continue Amiodarone and Eliquis. Follow up with cardiology as scheduled.

## 2014-12-01 NOTE — Patient Instructions (Signed)
Labs today.  Please contact [facility] to get medication assistance.  Basic Carbohydrate Counting for Diabetes Mellitus Carbohydrate counting is a method for keeping track of the amount of carbohydrates you eat. Eating carbohydrates naturally increases the level of sugar (glucose) in your blood, so it is important for you to know the amount that is okay for you to have in every meal. Carbohydrate counting helps keep the level of glucose in your blood within normal limits. The amount of carbohydrates allowed is different for every person. A dietitian can help you calculate the amount that is right for you. Once you know the amount of carbohydrates you can have, you can count the carbohydrates in the foods you want to eat. Carbohydrates are found in the following foods:  Grains, such as breads and cereals.  Dried beans and soy products.  Starchy vegetables, such as potatoes, peas, and corn.  Fruit and fruit juices.  Milk and yogurt.  Sweets and snack foods, such as cake, cookies, candy, chips, soft drinks, and fruit drinks. CARBOHYDRATE COUNTING There are two ways to count the carbohydrates in your food. You can use either of the methods or a combination of both. Reading the "Nutrition Facts" on Packaged Food The "Nutrition Facts" is an area that is included on the labels of almost all packaged food and beverages in the [place]. It includes the serving size of that food or beverage and information about the nutrients in each serving of the food, including the grams (g) of carbohydrate per serving.  Decide the number of servings of this food or beverage that you will be able to eat or drink. Multiply that number of servings by the number of grams of carbohydrate that is listed on the label for that serving. The total will be the amount of carbohydrates you will be having when you eat or drink this food or beverage. Learning Standard Serving Sizes of Food When you eat food that is not  packaged or does not include "Nutrition Facts" on the label, you need to measure the servings in order to count the amount of carbohydrates.A serving of most carbohydrate-rich foods contains about 15 g of carbohydrates. The following list includes serving sizes of carbohydrate-rich foods that provide 15 g ofcarbohydrate per serving:   1 slice of bread (1 oz) or 1 six-inch tortilla.    of a hamburger bun or English muffin.  4-6 crackers.   cup unsweetened dry cereal.    cup hot cereal.   cup rice or pasta.    cup mashed potatoes or  of a large baked potato.  1 cup fresh fruit or one small piece of fruit.    cup canned or frozen fruit or fruit juice.  1 cup milk.   cup plain fat-free yogurt or yogurt sweetened with artificial sweeteners.   cup cooked dried beans or starchy vegetable, such as peas, corn, or potatoes.  Decide the number of standard-size servings that you will eat. Multiply that number of servings by 15 (the grams of carbohydrates in that serving). For example, if you eat 2 cups of strawberries, you will have eaten 2 servings and 30 g of carbohydrates (2 servings x 15 g = 30 g). For foods such as soups and casseroles, in which more than one food is mixed in, you will need to count the carbohydrates in each food that is included. EXAMPLE OF CARBOHYDRATE COUNTING Sample Dinner  3 oz chicken breast.   cup of brown rice.   cup  of corn.  1 cup milk.   1 cup strawberries with sugar-free whipped topping.  Carbohydrate Calculation Step 1: Identify the foods that contain carbohydrates:   Rice.   Corn.   Milk.   Strawberries. Step 2:Calculate the number of servings eaten of each:   2 servings of rice.   1 serving of corn.   1 serving of milk.   1 serving of strawberries. Step 3: Multiply each of those number of servings by 15 g:   2 servings of rice x 15 g = 30 g.   1 serving of corn x 15 g = 15 g.   1 serving of milk x 15  g = 15 g.   1 serving of strawberries x 15 g = 15 g. Step 4: Add together all of the amounts to find the total grams of carbohydrates eaten: 30 g + 15 g + 15 g + 15 g = 75 g. Document Released: 05/06/2005 Document Revised: 09/20/2013 Document Reviewed: 04/02/2013 [facility] Patient Information 2015 [place], [place]. This information is not intended to replace advice given to you by your health care provider. Make sure you discuss any questions you have with your health care provider.

## 2014-12-01 NOTE — Assessment & Plan Note (Signed)
Will check renal function with labs. 

## 2014-12-01 NOTE — Progress Notes (Signed)
Pre visit review using our clinic review tool, if applicable. No additional management support is needed unless otherwise documented below in the visit note. 

## 2014-12-01 NOTE — Assessment & Plan Note (Signed)
Will check lipids and LFTs with labs today. 

## 2014-12-01 NOTE — Progress Notes (Signed)
Subjective:    Patient ID: [name] [name], female    DOB: 12/19/1934, 80 y.o.   MRN: [id number]  HPI  79YO female presents for follow up.  Having trouble affording medications.  DM - BG have been up and down. Some over 200. Has only small amount of Lantus left. Has been limiting use of medication because of cost.  Watching diet and eating less. Typically eating only once per day.  Wt Readings from Last 3 Encounters:  12/01/14 211 lb 8 oz (95.936 kg)  11/03/14 218 lb 4 oz (98.998 kg)  09/27/14 228 lb 6 oz (103.59 kg)   AFIB - no recent chest pain, palpitations. Running out of Eliquis.  Past medical, surgical, family and social history per today's encounter.  Review of Systems  Constitutional: Negative for fever, chills, appetite change, fatigue and unexpected weight change.  Eyes: Negative for visual disturbance.  Respiratory: Negative for shortness of breath.   Cardiovascular: Negative for chest pain and leg swelling.  Gastrointestinal: Negative for nausea, vomiting, abdominal pain, diarrhea and constipation.  Musculoskeletal: Positive for myalgias, back pain and arthralgias.  Skin: Negative for color change and rash.  Hematological: Negative for adenopathy. Does not bruise/bleed easily.  Psychiatric/Behavioral: Negative for sleep disturbance and dysphoric mood. The patient is not nervous/anxious.        Objective:    BP 114/52 mmHg  Pulse 68  Temp(Src) 98 F (36.7 C) (Oral)  Ht 5\' 3"  (1.6 m)  Wt 211 lb 8 oz (95.936 kg)  BMI 37.48 kg/m2  SpO2 96% Physical Exam  Constitutional: She is oriented to person, place, and time. She appears well-developed and well-nourished. No distress.  HENT:  Head: Normocephalic and atraumatic.  Right Ear: External ear normal.  Left Ear: External ear normal.  Nose: Nose normal.  Mouth/Throat: Oropharynx is clear and moist. No oropharyngeal exudate.  Eyes: Conjunctivae are normal. Pupils are equal, round, and reactive to light.  Right eye exhibits no discharge. Left eye exhibits no discharge. No scleral icterus.  Neck: Normal range of motion. Neck supple. No tracheal deviation present. No thyromegaly present.  Cardiovascular: Normal rate, regular rhythm, normal heart sounds and intact distal pulses.  Exam reveals no gallop and no friction rub.   No murmur heard. Pulmonary/Chest: Effort normal and breath sounds normal. No respiratory distress. She has no wheezes. She has no rales. She exhibits no tenderness.  Musculoskeletal: Normal range of motion. She exhibits no edema or tenderness.  Lymphadenopathy:    She has no cervical adenopathy.  Neurological: She is alert and oriented to person, place, and time. No cranial nerve deficit. She exhibits normal muscle tone. Coordination normal.  Skin: Skin is warm and dry. No rash noted. She is not diaphoretic. No erythema. No pallor.  Psychiatric: She has a normal mood and affect. Her behavior is normal. Judgment and thought content normal.          Assessment & Plan:   Problem List Items Addressed This Visit      High   Hyperlipidemia    Will check lipids and LFTs with labs today.      Hypertension    BP Readings from Last 3 Encounters:  12/01/14 114/52  11/03/14 120/56  09/27/14 146/64   BP well controlled. Renal function with labs today.        Unprioritized   Atrial fibrillation    NSR on exam today. Plan to continue Amiodarone and Eliquis. Follow up with cardiology as scheduled.  CKD (chronic kidney disease), stage III    Will check renal function with labs.      Diabetes mellitus type 2, uncontrolled - Primary    BG variable and compliance with medication is poor. Lantus reportedly expensive for her, however Preferred drug for her insurance. Will set up [facility] referral and [facility]. Check A1c with labs today.      Relevant Orders   Comprehensive metabolic panel   Hemoglobin A1c   Microalbumin / creatinine urine ratio     Lipid panel       Return in about 3 months (around 03/03/2015) for Recheck of Diabetes.

## 2014-12-07 ENCOUNTER — Telehealth: Payer: Self-pay | Admitting: *Deleted

## 2014-12-07 ENCOUNTER — Other Ambulatory Visit (INDEPENDENT_AMBULATORY_CARE_PROVIDER_SITE_OTHER): Payer: Commercial Managed Care - HMO

## 2014-12-07 DIAGNOSIS — N179 Acute kidney failure, unspecified: Secondary | ICD-10-CM | POA: Diagnosis not present

## 2014-12-07 LAB — BASIC METABOLIC PANEL
BUN: 22 mg/dL (ref 6–23)
CO2: 26 mEq/L (ref 19–32)
Calcium: 9 mg/dL (ref 8.4–10.5)
Chloride: 106 mEq/L (ref 96–112)
Creatinine, Ser: 1.04 mg/dL (ref 0.40–1.20)
GFR: 54.52 mL/min — AB (ref >60.00)
Glucose, Bld: 251 mg/dL — ABNORMAL HIGH (ref 70–99)
POTASSIUM: 4.5 meq/L (ref 3.5–5.1)
Sodium: 140 mEq/L (ref 135–145)

## 2014-12-07 NOTE — Telephone Encounter (Signed)
BMP for acute renal failure 

## 2014-12-07 NOTE — Telephone Encounter (Signed)
Labs and dx?  

## 2014-12-12 ENCOUNTER — Telehealth: Payer: Self-pay | Admitting: Internal Medicine

## 2014-12-12 NOTE — Telephone Encounter (Signed)
The patient called requesting her lab results.

## 2014-12-12 NOTE — Telephone Encounter (Signed)
Yes, as long as she is tolerating small meals and fluids, I would just continue supportive care. If any changes, we can see her in a visit.

## 2014-12-12 NOTE — Telephone Encounter (Signed)
Spoke with the patient regarding her labs that were done on the 7/20.  She is concerned as her appetite has changed int he past two days.  Has not eaten much  In the last two days, only a egg and tomato yesterday and nothing today.  States that she is drinking fluids, mainly water.  Nothing else is hurting except her knee but that is because she hurt it last week.  I told her to increase her fluids as tolerated and to eat small meals.  Please advise?

## 2014-12-15 NOTE — Telephone Encounter (Signed)
Notified pt. 

## 2014-12-27 ENCOUNTER — Other Ambulatory Visit: Payer: Self-pay | Admitting: Internal Medicine

## 2014-12-27 NOTE — Telephone Encounter (Signed)
rx faxed

## 2014-12-27 NOTE — Telephone Encounter (Signed)
Last OV 7.14.16.  Please advise refill 

## 2015-01-04 ENCOUNTER — Telehealth: Payer: Self-pay

## 2015-01-04 NOTE — Telephone Encounter (Signed)
Samples of Eliquis 5 mg samples given to the patient.

## 2015-01-04 NOTE — Telephone Encounter (Signed)
Pt would like Eliquis samples.  

## 2015-01-12 ENCOUNTER — Ambulatory Visit (INDEPENDENT_AMBULATORY_CARE_PROVIDER_SITE_OTHER): Payer: Commercial Managed Care - HMO

## 2015-01-12 VITALS — BP 118/60 | HR 68 | Temp 97.4°F | Resp 14 | Ht 63.0 in | Wt 217.8 lb

## 2015-01-12 DIAGNOSIS — K219 Gastro-esophageal reflux disease without esophagitis: Secondary | ICD-10-CM

## 2015-01-12 DIAGNOSIS — Z0000 Encounter for general adult medical examination without abnormal findings: Secondary | ICD-10-CM

## 2015-01-12 MED ORDER — OMEPRAZOLE 20 MG PO CPDR: 20.0000 mg | DELAYED_RELEASE_CAPSULE | Freq: Two times a day (BID) | ORAL | Status: DC

## 2015-01-12 MED ORDER — LEVOTHYROXINE SODIUM 150 MCG PO TABS: 150.0000 ug | ORAL_TABLET | Freq: Every day | ORAL | Status: DC

## 2015-01-12 MED ORDER — AMLODIPINE BESYLATE 10 MG PO TABS: 10.0000 mg | ORAL_TABLET | Freq: Every day | ORAL | Status: DC

## 2015-01-12 MED ORDER — APIXABAN 5 MG PO TABS: ORAL_TABLET | ORAL | Status: DC

## 2015-01-12 NOTE — Patient Instructions (Addendum)
Mrs. Brodhead,  Thank you for taking time to come for your Medicare Wellness Visit.  I appreciate your ongoing commitment to your health goals. Please review the following plan we discussed and let me know if I can assist you in the future.  Complete and return HCPOA/Living Will (paperwork for someone to make medical decisions for you if you cannot for yourself) forms to follow up visit.  Reconsider Influenza (Flu shot) vaccine due at follow up visit.  Reconsider Bone Density/Dexa Scan.  Reconsider TDAP (Tetanus) injection at follow up visit or local pharmacy.  Reconsider Zostavax (Shingles Injection).  Follow up with Eye Exam Appointment.  Bone Densitometry Bone densitometry is a special X-ray that measures your bone density and can be used to help predict your risk of bone fractures. This test is used to determine bone mineral content and density to diagnose osteoporosis. Osteoporosis is the loss of bone that may cause the bone to become weak. Osteoporosis commonly occurs in women entering menopause. However, it may be found in men and in people with other diseases. PREPARATION FOR TEST No preparation necessary. WHO SHOULD BE TESTED?  All women older than 79.  Postmenopausal women (50 to 80) with risk factors for osteoporosis.  People with a previous fracture caused by normal activities.  People with a small body frame (less than 127 poundsor a body mass index [BMI] of less than 21).  People who have a parent with a hip fracture or history of osteoporosis.  People who smoke.  People who have rheumatoid arthritis.  Anyone who engages in excessive alcohol use (more than 3 drinks most days).  Women who experience early menopause. WHEN SHOULD YOU BE RETESTED? Current guidelines suggest that you should wait at least 2 years before doing a bone density test again if your first test was normal.Recent studies indicated that women with normal bone density may be able to wait a few  years before needing to repeat a bone density test. You should discuss this with your caregiver.  NORMAL FINDINGS   Normal: less than standard deviation below normal (greater than -1).  Osteopenia: 1 to 2.5 standard deviations below normal (-1 to -2.5).  Osteoporosis: greater than 2.5 standard deviations below normal (less than -2.5). Test results are reported as a "T score" and a "Z score."The T score is a number that compares your bone density with the bone density of healthy, young women.The Z score is a number that compares your bone density with the scores of women who are the same age, gender, and race.  Ranges for normal findings may vary among different laboratories and hospitals. You should always check with your doctor after having lab work or other tests done to discuss the meaning of your test results and whether your values are considered within normal limits. MEANING OF TEST  Your caregiver will go over the test results with you and discuss the importance and meaning of your results, as well as treatment options and the need for additional tests if necessary. OBTAINING THE TEST RESULTS It is your responsibility to obtain your test results. Ask the lab or department performing the test when and how you will get your results. Document Released: 05/28/2004 Document Revised: 07/29/2011 Document Reviewed: 06/20/2010 [facility] Patient Information 2015 [place], [place]. This information is not intended to replace advice given to you by your health care provider. Make sure you discuss any questions you have with your health care provider.  Diabetes and Foot Care Diabetes may cause you to  have problems because of poor blood supply (circulation) to your feet and legs. This may cause the skin on your feet to become thinner, break easier, and heal more slowly. Your skin may become dry, and the skin may peel and crack. You may also have nerve damage in your legs and feet causing decreased feeling  in them. You may not notice minor injuries to your feet that could lead to infections or more serious problems. Taking care of your feet is one of the most important things you can do for yourself.  HOME CARE INSTRUCTIONS  Wear shoes at all times, even in the house. Do not go barefoot. Bare feet are easily injured.  Check your feet daily for blisters, cuts, and redness. If you cannot see the bottom of your feet, use a mirror or ask someone for help.  Wash your feet with warm water (do not use hot water) and mild soap. Then pat your feet and the areas between your toes until they are completely dry. Do not soak your feet as this can dry your skin.  Apply a moisturizing lotion or petroleum jelly (that does not contain alcohol and is unscented) to the skin on your feet and to dry, brittle toenails. Do not apply lotion between your toes.  Trim your toenails straight across. Do not dig under them or around the cuticle. File the edges of your nails with an emery board or nail file.  Do not cut corns or calluses or try to remove them with medicine.  Wear clean socks or stockings every day. Make sure they are not too tight. Do not wear knee-high stockings since they may decrease blood flow to your legs.  Wear shoes that fit properly and have enough cushioning. To break in new shoes, wear them for just a few hours a day. This prevents you from injuring your feet. Always look in your shoes before you put them on to be sure there are no objects inside.  Do not cross your legs. This may decrease the blood flow to your feet.  If you find a minor scrape, cut, or break in the skin on your feet, keep it and the skin around it clean and dry. These areas may be cleansed with mild soap and water. Do not cleanse the area with peroxide, alcohol, or iodine.  When you remove an adhesive bandage, be sure not to damage the skin around it.  If you have a wound, look at it several times a day to make sure it is  healing.  Do not use heating pads or hot water bottles. They may burn your skin. If you have lost feeling in your feet or legs, you may not know it is happening until it is too late.  Make sure your health care provider performs a complete foot exam at least annually or more often if you have foot problems. Report any cuts, sores, or bruises to your health care provider immediately. SEEK MEDICAL CARE IF:   You have an injury that is not healing.  You have cuts or breaks in the skin.  You have an ingrown nail.  You notice redness on your legs or feet.  You feel burning or tingling in your legs or feet.  You have pain or cramps in your legs and feet.  Your legs or feet are numb.  Your feet always feel cold. SEEK IMMEDIATE MEDICAL CARE IF:   There is increasing redness, swelling, or pain in or around a wound.  There is a red line that goes up your leg.  Pus is coming from a wound.  You develop a fever or as directed by your health care provider.  You notice a bad smell coming from an ulcer or wound. Document Released: 05/03/2000 Document Revised: 01/06/2013 Document Reviewed: 10/13/2012 [facility] Patient Information 2015 [place], [place]. This information is not intended to replace advice given to you by your health care provider. Make sure you discuss any questions you have with your health care provider.  Health Maintenance Adopting a healthy lifestyle and getting preventive care can go a long way to promote health and wellness. Talk with your health care provider about what schedule of regular examinations is right for you. This is a good chance for you to check in with your provider about disease prevention and staying healthy. In between checkups, there are plenty of things you can do on your own. Experts have done a lot of research about which lifestyle changes and preventive measures are most likely to keep you healthy. Ask your health care provider for more  information. WEIGHT AND DIET  Eat a healthy diet  Be sure to include plenty of vegetables, fruits, low-fat dairy products, and lean protein.  Do not eat a lot of foods high in solid fats, added sugars, or salt.  Get regular exercise. This is one of the most important things you can do for your health.  Most adults should exercise for at least 150 minutes each week. The exercise should increase your heart rate and make you sweat (moderate-intensity exercise).  Most adults should also do strengthening exercises at least twice a week. This is in addition to the moderate-intensity exercise.  Maintain a healthy weight  Body mass index (BMI) is a measurement that can be used to identify possible weight problems. It estimates body fat based on height and weight. Your health care provider can help determine your BMI and help you achieve or maintain a healthy weight.  For females 63 years of age and older:   A BMI below 18.5 is considered underweight.  A BMI of 18.5 to 24.9 is normal.  A BMI of 25 to 29.9 is considered overweight.  A BMI of 30 and above is considered obese.  Watch levels of cholesterol and blood lipids  You should start having your blood tested for lipids and cholesterol at 78 years of age, then have this test every 5 years.  You may need to have your cholesterol levels checked more often if:  Your lipid or cholesterol levels are high.  You are older than 78 years of age.  You are at high risk for heart disease.  CANCER SCREENING   Lung Cancer  Lung cancer screening is recommended for adults 65-63 years old who are at high risk for lung cancer because of a history of smoking.  A yearly low-dose CT scan of the lungs is recommended for people who:  Currently smoke.  Have quit within the past 15 years.  Have at least a 30-pack-year history of smoking. A pack year is smoking an average of one pack of cigarettes a day for 1 year.  Yearly screening should  continue until it has been 15 years since you quit.  Yearly screening should stop if you develop a health problem that would prevent you from having lung cancer treatment.  Breast Cancer  Practice breast self-awareness. This means understanding how your breasts normally appear and feel.  It also means doing regular breast self-exams. Let  your health care provider know about any changes, no matter how small.  If you are in your 20s or 30s, you should have a clinical breast exam (CBE) by a health care provider every 1-3 years as part of a regular health exam.  If you are 38 or older, have a CBE every year. Also consider having a breast X-ray (mammogram) every year.  If you have a family history of breast cancer, talk to your health care provider about genetic screening.  If you are at high risk for breast cancer, talk to your health care provider about having an MRI and a mammogram every year.  Breast cancer gene (BRCA) assessment is recommended for women who have family members with BRCA-related cancers. BRCA-related cancers include:  Breast.  Ovarian.  Tubal.  Peritoneal cancers.  Results of the assessment will determine the need for genetic counseling and BRCA1 and BRCA2 testing. Cervical Cancer Routine pelvic examinations to screen for cervical cancer are no longer recommended for nonpregnant women who are considered low risk for cancer of the pelvic organs (ovaries, uterus, and vagina) and who do not have symptoms. A pelvic examination may be necessary if you have symptoms including those associated with pelvic infections. Ask your health care provider if a screening pelvic exam is right for you.   The Pap test is the screening test for cervical cancer for women who are considered at risk.  If you had a hysterectomy for a problem that was not cancer or a condition that could lead to cancer, then you no longer need Pap tests.  If you are older than 65 years, and you have had  normal Pap tests for the past 10 years, you no longer need to have Pap tests.  If you have had past treatment for cervical cancer or a condition that could lead to cancer, you need Pap tests and screening for cancer for at least 20 years after your treatment.  If you no longer get a Pap test, assess your risk factors if they change (such as having a new sexual partner). This can affect whether you should start being screened again.  Some women have medical problems that increase their chance of getting cervical cancer. If this is the case for you, your health care provider may recommend more frequent screening and Pap tests.  The human papillomavirus (HPV) test is another test that may be used for cervical cancer screening. The HPV test looks for the virus that can cause cell changes in the cervix. The cells collected during the Pap test can be tested for HPV.  The HPV test can be used to screen women 63 years of age and older. Getting tested for HPV can extend the interval between normal Pap tests from three to five years.  An HPV test also should be used to screen women of any age who have unclear Pap test results.  After 78 years of age, women should have HPV testing as often as Pap tests.  Colorectal Cancer  This type of cancer can be detected and often prevented.  Routine colorectal cancer screening usually begins at 78 years of age and continues through 78 years of age.  Your health care provider may recommend screening at an earlier age if you have risk factors for colon cancer.  Your health care provider may also recommend using home test kits to check for hidden blood in the stool.  A small camera at the end of a tube can be used to examine  your colon directly (sigmoidoscopy or colonoscopy). This is done to check for the earliest forms of colorectal cancer.  Routine screening usually begins at age 8.  Direct examination of the colon should be repeated every 5-10 years through  78 years of age. However, you may need to be screened more often if early forms of precancerous polyps or small growths are found. Skin Cancer  Check your skin from head to toe regularly.  Tell your health care provider about any new moles or changes in moles, especially if there is a change in a mole's shape or color.  Also tell your health care provider if you have a mole that is larger than the size of a pencil eraser.  Always use sunscreen. Apply sunscreen liberally and repeatedly throughout the day.  Protect yourself by wearing long sleeves, pants, a wide-brimmed hat, and sunglasses whenever you are outside. HEART DISEASE, DIABETES, AND HIGH BLOOD PRESSURE   Have your blood pressure checked at least every 1-2 years. High blood pressure causes heart disease and increases the risk of stroke.  If you are between 71 years and 8 years old, ask your health care provider if you should take aspirin to prevent strokes.  Have regular diabetes screenings. This involves taking a blood sample to check your fasting blood sugar level.  If you are at a normal weight and have a low risk for diabetes, have this test once every three years after 78 years of age.  If you are overweight and have a high risk for diabetes, consider being tested at a younger age or more often. PREVENTING INFECTION  Hepatitis B  If you have a higher risk for hepatitis B, you should be screened for this virus. You are considered at high risk for hepatitis B if:  You were born in a country where hepatitis B is common. Ask your health care provider which countries are considered high risk.  Your parents were born in a high-risk country, and you have not been immunized against hepatitis B (hepatitis B vaccine).  You have HIV or AIDS.  You use needles to inject street drugs.  You live with someone who has hepatitis B.  You have had sex with someone who has hepatitis B.  You get hemodialysis treatment.  You take  certain medicines for conditions, including cancer, organ transplantation, and autoimmune conditions. Hepatitis C  Blood testing is recommended for:  Everyone born from [birth year] through 1963.  Anyone with known risk factors for hepatitis C. Sexually transmitted infections (STIs)  You should be screened for sexually transmitted infections (STIs) including gonorrhea and chlamydia if:  You are sexually active and are younger than 78 years of age.  You are older than 78 years of age and your health care provider tells you that you are at risk for this type of infection.  Your sexual activity has changed since you were last screened and you are at an increased risk for chlamydia or gonorrhea. Ask your health care provider if you are at risk.  If you do not have HIV, but are at risk, it may be recommended that you take a prescription medicine daily to prevent HIV infection. This is called pre-exposure prophylaxis (PrEP). You are considered at risk if:  You are sexually active and do not regularly use condoms or know the HIV status of your partner(s).  You take drugs by injection.  You are sexually active with a partner who has HIV. Talk with your health care provider about whether  you are at high risk of being infected with HIV. If you choose to begin PrEP, you should first be tested for HIV. You should then be tested every 3 months for as long as you are taking PrEP.  PREGNANCY   If you are premenopausal and you may become pregnant, ask your health care provider about preconception counseling.  If you may become pregnant, take 400 to 800 micrograms (mcg) of folic acid every day.  If you want to prevent pregnancy, talk to your health care provider about birth control (contraception). OSTEOPOROSIS AND MENOPAUSE   Osteoporosis is a disease in which the bones lose minerals and strength with aging. This can result in serious bone fractures. Your risk for osteoporosis can be identified using a  bone density scan.  If you are 65 years of age or older, or if you are at risk for osteoporosis and fractures, ask your health care provider if you should be screened.  Ask your health care provider whether you should take a calcium or vitamin D supplement to lower your risk for osteoporosis.  Menopause may have certain physical symptoms and risks.  Hormone replacement therapy may reduce some of these symptoms and risks. Talk to your health care provider about whether hormone replacement therapy is right for you.  HOME CARE INSTRUCTIONS   Schedule regular health, dental, and eye exams.  Stay current with your immunizations.   Do not use any tobacco products including cigarettes, chewing tobacco, or electronic cigarettes.  If you are pregnant, do not drink alcohol.  If you are breastfeeding, limit how much and how often you drink alcohol.  Limit alcohol intake to no more than 1 drink per day for nonpregnant women. One drink equals 12 ounces of beer, 5 ounces of wine, or 1 ounces of hard liquor.  Do not use street drugs.  Do not share needles.  Ask your health care provider for help if you need support or information about quitting drugs.  Tell your health care provider if you often feel depressed.  Tell your health care provider if you have ever been abused or do not feel safe at home. Document Released: 11/19/2010 Document Revised: 09/20/2013 Document Reviewed: 04/07/2013 [facility] Patient Information 2015 [place], [place]. This information is not intended to replace advice given to you by your health care provider. Make sure you discuss any questions you have with your health care provider.

## 2015-01-12 NOTE — Progress Notes (Signed)
Subjective:   [name] [name] is a 80 y.o. female who presents for Medicare Annual (Subsequent) preventive examination.  Review of Systems:  No ROS.  Medicare Wellness Visit.  Cardiac Risk Factors include: obesity (BMI >30kg/m2)     Objective:    The goal of the wellness visit is to assist the patient how to close the gaps in care and create a preventative care plan for the patient. This is a routine visit for [facility].    Vitals: BP 118/60 mmHg  Pulse 68  Temp(Src) 97.4 F (36.3 C) (Oral)  Resp 14  Ht 5\' 3"  (1.6 m)  Wt 217 lb 12.8 oz (98.793 kg)  BMI 38.59 kg/m2  SpO2 97%  LMP   Tobacco History  Smoking status  . Never Smoker   Smokeless tobacco  . Never Used    Comment: former passive smoker     Counseling given: Not Answered   Past Medical History  Diagnosis Date  . DM (diabetes mellitus)   . PAF (paroxysmal atrial fibrillation)     a. s/p dccv 03/2014;  b. amio/eliquis;  c. pt d/c'd amio 05/2014;  d. 06/2013 bb/ccb d/c 2/2 symptomatic bradycardia.  . Dyslipidemia   . CAD (coronary artery disease)     a. s/p prior PCI RCA with ISR req CBA;  b. 04/2013 Neg MV;  c. 05/2013 Cath: LM 20, LAD 30p, LCX 20p, OM1 90 small, RCA 28m, PDA 40-->Med Rx.  . Osteoarthritis   . Hypothyroidism     hx  . Rheumatoid arthritis(714.0)   . Obesity   . GERD (gastroesophageal reflux disease)   . Depression   . Shoulder fracture, left     Dr. 72m  . Cerebral infarction   . CKD (chronic kidney disease), stage III    Past Surgical History  Procedure Laterality Date  . Rca stent placement  2000  . Restenosis with ptca placement  2005  . Cataract surgery    . Partial hysterectomy    . Replacement total knee  2013    right  . Shoulder surgery  02/04/13  . Cardiac catheterization  06/11/2013  . Coronary angioplasty     Family History  Problem Relation Age of Onset  . Esophageal cancer Mother   . Diabetes Mother   . Cancer Mother     esophageal    History  Sexual  Activity  . Sexual Activity: No    Outpatient Encounter Prescriptions as of 01/12/2015  Medication Sig  . albuterol (PROVENTIL HFA;VENTOLIN HFA) 108 (90 BASE) MCG/ACT inhaler Inhale 2 puffs into the lungs every 6 (six) hours as needed for wheezing or shortness of breath.  01/14/2015 amiodarone (PACERONE) 100 MG tablet Take 1 tablet (100 mg total) by mouth daily.  Marland Kitchen amLODipine (NORVASC) 10 MG tablet Take 1 tablet (10 mg total) by mouth daily.  Marland Kitchen apixaban (ELIQUIS) 5 MG TABS tablet TAKE 1 TABLET BY MOUTH 2 TIMES A DAY.  Marland Kitchen atorvastatin (LIPITOR) 20 MG tablet TAKE 1 TABLET DAILY  . ferrous sulfate 325 (65 FE) MG tablet Take 325 mg by mouth daily with breakfast.  . fluticasone (FLONASE) 50 MCG/ACT nasal spray Place 2 sprays into the nose daily.  . furosemide (LASIX) 40 MG tablet Take 1 tablet (40 mg total) by mouth daily.  Marland Kitchen gabapentin (NEURONTIN) 300 MG capsule Take 1 capsule (300 mg total) by mouth 3 (three) times daily.  . insulin aspart (NOVOLOG) 100 UNIT/ML injection Before each meal 3 times a day, 140-199 - 2  units, 200-250 - 4 units, 251-299 - 6 units,  300-349 - 8 units,  350 or above 10 units. Insulin PEN if approved, provide syringes and needles if needed.  . insulin glargine (LANTUS) 100 UNIT/ML injection Inject 0.5 mLs (50 Units total) into the skin at bedtime.  Marland Kitchen latanoprost (XALATAN) 0.005 % ophthalmic solution Place 1 drop into both eyes at bedtime.   Marland Kitchen levothyroxine (SYNTHROID, LEVOTHROID) 150 MCG tablet Take 1 tablet (150 mcg total) by mouth daily.  Marland Kitchen lisinopril (PRINIVIL,ZESTRIL) 10 MG tablet TAKE 1 TABLET EVERY DAY  . meclizine (ANTIVERT) 25 MG tablet Take 1 tablet (25 mg total) by mouth 2 (two) times daily. As needed for dizziness or nausea (Patient taking differently: Take 25 mg by mouth 2 (two) times daily as needed. As needed for dizziness or nausea)  . metFORMIN (GLUCOPHAGE) 500 MG tablet Take 1 tablet (500 mg total) by mouth 2 (two) times daily.  . nitroGLYCERIN (NITROSTAT) 0.4 MG SL  tablet Place 1 tablet (0.4 mg total) under the tongue every 5 (five) minutes as needed.  Marland Kitchen NOVOLIN R RELION 100 UNIT/ML injection INJECT FOUR UNITS SUBCUTANEOUSLY IF BLOOD SUGAR IS GREATER THAN 250  . omeprazole (PRILOSEC) 20 MG capsule Take 1 capsule (20 mg total) by mouth 2 (two) times daily.  . potassium chloride SA (K-DUR,KLOR-CON) 20 MEQ tablet Take 1 tablet (20 mEq total) by mouth daily.  . sertraline (ZOLOFT) 50 MG tablet Take 1 tablet (50 mg total) by mouth daily.  . traMADol (ULTRAM) 50 MG tablet TAKE 2 TABLETS BY MOUTH EVERY 8 HOURS AS NEEDED FOR MODERATE PAIN  . trimethoprim-polymyxin b (POLYTRIM) ophthalmic solution Place 1 drop into both eyes every 4 (four) hours.  . [DISCONTINUED] amLODipine (NORVASC) 10 MG tablet TAKE 1 TABLET BY MOUTH DAILY.  . [DISCONTINUED] apixaban (ELIQUIS) 5 MG TABS tablet TAKE 1 TABLET BY MOUTH 2 TIMES A DAY.  . [DISCONTINUED] levothyroxine (SYNTHROID, LEVOTHROID) 150 MCG tablet Take 1 tablet (150 mcg total) by mouth daily.  . [DISCONTINUED] omeprazole (PRILOSEC) 20 MG capsule Take 1 capsule (20 mg total) by mouth 2 (two) times daily.   No facility-administered encounter medications on file as of 01/12/2015.    Activities of Daily Living In your present state of health, do you have any difficulty performing the following activities: 01/12/2015 06/26/2014  Hearing? N -  Vision? N -  Difficulty concentrating or making decisions? Y -  Walking or climbing stairs? Y -  Dressing or bathing? N -  Doing errands, shopping? N N  Preparing Food and eating ? N -  Using the Toilet? N -  In the past six months, have you accidently leaked urine? N -  Do you have problems with loss of bowel control? N -  Managing your Medications? N -  Managing your Finances? N -  Housekeeping or managing your Housekeeping? N -    Patient Care Team: Shelia Media, MD as PCP - General (Internal Medicine)    Assessment:     Osteoporosis risk reviewed; Patient declined  referral for Bone Density/Dexa Scan today. Postponed per patient request.   Taking meds without issues; no barriers identified.  Some medications refilled.  Labs were completed by MD at recent visit.  No Risk for hepatitis or high risk social behavior identified via hepatitis screen  Educated on shingles and follow up with insurance company for co-pays or charges applied.  Declined Zostavax today. Postponed per patient request.  Educated on Vaccines; Declined TDAP and Influenza today.  Postponed per patient request.  Simple Foot Exam Completed; WNL. Pink, Warm, Dry. Intact.   Safety issues reviewed; smoke detectors in the home.  No firearms in the home.  No violence in the home.  Wears seatbelt when driving or riding with others.  Memory issues; patient has a hard time recalling some information. She attributes this to Hx of stroke.   The patient was oriented x 3; appropriate in dress and manner and no objective failures at ADL's or IADL's.   Bladder issues assessed; No acute change in bladder function. Hx of Chronic Kidney Dx.  Diabetic Eye Exam scheduled for November 2016. Stable and followed by [facility].  No Hx of retinopathy.  Morbid Obesity with BMI 37.50; see patient centered goal.  Stable and followed by MD.  End of life planning was discussed; aging in home or other; plans to complete and return HCPOA/Living Will.   Exercise Activities and Dietary recommendations Current Exercise Habits:: Home exercise routine (Chair exercise.  Raises legs and arms when sitting.), Time (Minutes): 15, Frequency (Times/Week): 3, Weekly Exercise (Minutes/Week): 45, Intensity: Mild  Goals    . Increase lean proteins     Make better choices when eating by choosing lean proteins (chicken, fish, [place]) and incorporating more fresh fruit and vegetables in diet.    . Increase physical activity     Currently does chair exercises by lifting legs and raising arms 10-15 minutes about 3  days a week. Patient centered goal is to continue regiment and increase length of time by an additional 5 minutes and begin using exercising bike so that it keeps pressure off of her knees.      Fall Risk Fall Risk  01/12/2015 01/10/2014 06/05/2012 05/21/2012 05/06/2012  Falls in the past year? No No No No No  Risk for fall due to : - Impaired balance/gait;Impaired mobility - - -   Depression Screen PHQ 2/9 Scores 01/12/2015 01/10/2014 06/05/2012 05/21/2012  PHQ - 2 Score 0 1 0 0     Cognitive Testing MMSE - Mini Mental State Exam 01/12/2015 12/31/2013  Orientation to time 5 4  Orientation to Place 5 5  Registration 3 3  Attention/ Calculation 5 4  Recall 1 3  Recall-comments Trouble remembering. -  Language- name 2 objects 2 1  Language- repeat 1 1  Language- follow 3 step command 3 1  Language- read & follow direction 1 1  Write a sentence 1 1  Copy design 1 0  Total score 28 24    Immunization History  Administered Date(s) Administered  . Influenza Split 02/21/2011, 02/04/2012  . Influenza,inj,Quad PF,36+ Mos 08/15/2014  . Influenza-Unspecified 02/28/2013  . Pneumococcal Conjugate-13 09/03/2013  . Pneumococcal Polysaccharide-23 02/21/2011   Screening Tests Health Maintenance  Topic Date Due  . OPHTHALMOLOGY EXAM  04/05/2015 (Originally 07/13/2014)  . INFLUENZA VACCINE  04/06/2015 (Originally 12/19/2014)  . DEXA SCAN  04/06/2015 (Originally 04/20/2002)  . ZOSTAVAX  04/06/2015 (Originally 04/20/1997)  . TETANUS/TDAP  04/06/2015 (Originally 04/20/1956)  . HEMOGLOBIN A1C  06/03/2015  . URINE MICROALBUMIN  08/15/2015  . FOOT EXAM  01/12/2016  . COLONOSCOPY  05/18/2024  . PNA vac Low Risk Adult  Completed      Plan:    During the course of the visit the patient was educated and counseled about the following appropriate screening and preventive services:   Vaccines to include Pneumoccal, Influenza, Hepatitis B, Td, Zostavax, HCV  Electrocardiogram  Cardiovascular  Disease  Colorectal cancer screening  Bone density  screening  Diabetes screening  Glaucoma screening  Mammography/PAP  Nutrition counseling   Patient Instructions (the written plan) was given to the patient.   Ashok Pall, LPN  11/27/6267

## 2015-01-13 DIAGNOSIS — Z7189 Other specified counseling: Secondary | ICD-10-CM | POA: Insufficient documentation

## 2015-01-13 DIAGNOSIS — Z0000 Encounter for general adult medical examination without abnormal findings: Secondary | ICD-10-CM | POA: Insufficient documentation

## 2015-01-13 NOTE — Progress Notes (Signed)
Annual Wellness Visit as completed by Health Coach was reviewed in full.  

## 2015-01-24 ENCOUNTER — Other Ambulatory Visit: Payer: Self-pay | Admitting: *Deleted

## 2015-01-24 ENCOUNTER — Telehealth: Payer: Self-pay | Admitting: *Deleted

## 2015-01-24 MED ORDER — APIXABAN 5 MG PO TABS: ORAL_TABLET | ORAL | Status: DC

## 2015-01-24 NOTE — Telephone Encounter (Signed)
[name], pts daughter, called states [facility] needs an Eliquis Rx faxed to them to assist pt with payment.  Please advise [phone] fax

## 2015-01-24 NOTE — Telephone Encounter (Signed)
Fine to send Rx. 

## 2015-01-24 NOTE — Telephone Encounter (Signed)
rx printed awaiting signature to be faxed

## 2015-01-30 ENCOUNTER — Other Ambulatory Visit: Payer: Self-pay

## 2015-01-30 MED ORDER — APIXABAN 5 MG PO TABS: ORAL_TABLET | ORAL | Status: DC

## 2015-01-30 NOTE — Telephone Encounter (Signed)
Refill sent for Eliquis 5 mg  

## 2015-02-07 ENCOUNTER — Telehealth: Payer: Self-pay | Admitting: *Deleted

## 2015-02-07 ENCOUNTER — Other Ambulatory Visit: Payer: Self-pay | Admitting: Internal Medicine

## 2015-02-07 NOTE — Telephone Encounter (Signed)
Placed samples Eliquis 5 mg at front desk for pick up. 

## 2015-02-07 NOTE — Telephone Encounter (Signed)
Patient calling the office for samples of medication:   1.  What medication and dosage are you requesting samples for? Eliquis   2.  Are you currently out of this medication? Not she has enough for this week   3. Are you requesting samples to get you through until a mail order prescription arrives? No

## 2015-02-13 ENCOUNTER — Telehealth: Payer: Self-pay

## 2015-02-13 NOTE — Telephone Encounter (Signed)
Eliquis 5 mg samples placed at front desk for pick up. 

## 2015-02-13 NOTE — Telephone Encounter (Signed)
Pt would like Eliquis samples.  

## 2015-02-17 ENCOUNTER — Telehealth: Payer: Self-pay | Admitting: Internal Medicine

## 2015-02-17 ENCOUNTER — Other Ambulatory Visit: Payer: Self-pay

## 2015-02-17 MED ORDER — AMLODIPINE BESYLATE 10 MG PO TABS: 10.0000 mg | ORAL_TABLET | Freq: Every day | ORAL | Status: DC

## 2015-02-17 MED ORDER — ATORVASTATIN CALCIUM 20 MG PO TABS: 20.0000 mg | ORAL_TABLET | Freq: Every day | ORAL | Status: DC

## 2015-02-17 MED ORDER — LEVOTHYROXINE SODIUM 150 MCG PO TABS: 150.0000 ug | ORAL_TABLET | Freq: Every day | ORAL | Status: DC

## 2015-02-17 NOTE — Telephone Encounter (Signed)
Refilled medications per our protocol.

## 2015-02-17 NOTE — Telephone Encounter (Signed)
Pt called stating she needs a refill for  atorvastatin (LIPITOR) 20 MG tablet  Medication     levothyroxine (SYNTHROID, LEVOTHROID) 150 MCG tablet  Medication     amLODipine (NORVASC) 10 MG tablet  Medication     Pharmacy is [facility]. Thank You!

## 2015-03-03 ENCOUNTER — Telehealth: Payer: Self-pay

## 2015-03-03 NOTE — Telephone Encounter (Signed)
Samples Eliquis 5 mg placed at front desk for pick up. 

## 2015-03-03 NOTE — Telephone Encounter (Signed)
Pt would like Eliquis samples.  

## 2015-03-22 ENCOUNTER — Telehealth: Payer: Self-pay

## 2015-03-22 ENCOUNTER — Telehealth: Payer: Self-pay | Admitting: *Deleted

## 2015-03-22 NOTE — Telephone Encounter (Signed)
One option would be to change to warfarin Would need Coumadin clinic to arrange  Other option would be that we could try to provide samples until January 1

## 2015-03-22 NOTE — Telephone Encounter (Signed)
Pt states she needs something less expensive than Eliquis. Please call. States it is $164/ month

## 2015-03-22 NOTE — Telephone Encounter (Signed)
Please see previous phone note.  

## 2015-03-22 NOTE — Telephone Encounter (Signed)
Patient called again after talking to her pharmacist and Xarelto 20 mg once a day, Pradaxa 150 mg twice a day are still too expensive. What should she do?

## 2015-03-22 NOTE — Telephone Encounter (Signed)
Patient has requested a different medication other than eliquis,  due to price.

## 2015-03-22 NOTE — Telephone Encounter (Signed)
There is not another cheaper alternative to this. She should discuss with her cardiologist.

## 2015-03-22 NOTE — Telephone Encounter (Signed)
[name] [name] at 03/22/2015 4:17 PM             Patient called again after talking to her pharmacist and Xarelto 20 mg once a day, Pradaxa 150 mg twice a day are still too expensive. What should she do?

## 2015-03-22 NOTE — Telephone Encounter (Signed)
S/w pt who states Eliquis is not affordable at $164/month. Reviewed suggestions by Dr. Mariah Milling on June AVS:  "Ask the pharmacy about : Xarelto 20 mg once a day Pradaxa 150 mg twice a day"  She states she thinks she asked pharmacist about cost of these two medications but can't be sure. Advised pt to call pharmacy to get cost information on both xarelto and pradaxa and call back. Pt verbalized understanding w/no further questions.

## 2015-03-22 NOTE — Telephone Encounter (Signed)
Patient requesting different Medication

## 2015-03-22 NOTE — Telephone Encounter (Signed)
Called pt , advised that  Dr Dan Humphreys says there really isn't a cheaper alternative, but that she should consult her cardiologist.

## 2015-03-23 NOTE — Telephone Encounter (Signed)
Spoke w/ ptl.  Advised her of Dr. Windell Hummingbird recommendation.  She does not want to go on coumadin due to frequent visits. Advised her that I am leaving samples of Eliquis 5 mg and a coupon for a free 30 day supply at the front desk for her to pick up at her convenience.  She is appreciative and will call back w/ any further questions or concerns.

## 2015-04-03 ENCOUNTER — Telehealth: Payer: Self-pay | Admitting: Internal Medicine

## 2015-04-03 NOTE — Telephone Encounter (Signed)
I don't see that we have an earlier appointment. We can see if there is a cancellation?

## 2015-04-03 NOTE — Telephone Encounter (Signed)
At [facility] office for the afternoon. Will route to Dr. Tilman Neat CMA for further follow up.

## 2015-04-03 NOTE — Telephone Encounter (Signed)
Spoke with pt, offered a sooner appt, Pt declined that appt and requested to keep her appt on thurs

## 2015-04-03 NOTE — Telephone Encounter (Signed)
Pt called about having sores in her head. Pt states it started out on her neck. Pt does not want to see another provider. Pt has an appt on 11/17 @1 :30p, pt wants to know if she can come in sooner? Thank You!

## 2015-04-06 ENCOUNTER — Encounter: Payer: Self-pay | Admitting: Internal Medicine

## 2015-04-06 ENCOUNTER — Ambulatory Visit (INDEPENDENT_AMBULATORY_CARE_PROVIDER_SITE_OTHER): Payer: Commercial Managed Care - HMO | Admitting: Internal Medicine

## 2015-04-06 VITALS — BP 110/73 | HR 88 | Temp 98.3°F | Ht 63.0 in | Wt 209.1 lb

## 2015-04-06 DIAGNOSIS — E1159 Type 2 diabetes mellitus with other circulatory complications: Secondary | ICD-10-CM

## 2015-04-06 DIAGNOSIS — E785 Hyperlipidemia, unspecified: Secondary | ICD-10-CM

## 2015-04-06 DIAGNOSIS — I480 Paroxysmal atrial fibrillation: Secondary | ICD-10-CM

## 2015-04-06 DIAGNOSIS — E1165 Type 2 diabetes mellitus with hyperglycemia: Secondary | ICD-10-CM

## 2015-04-06 DIAGNOSIS — E039 Hypothyroidism, unspecified: Secondary | ICD-10-CM | POA: Diagnosis not present

## 2015-04-06 DIAGNOSIS — I158 Other secondary hypertension: Secondary | ICD-10-CM | POA: Diagnosis not present

## 2015-04-06 DIAGNOSIS — Z23 Encounter for immunization: Secondary | ICD-10-CM

## 2015-04-06 DIAGNOSIS — L989 Disorder of the skin and subcutaneous tissue, unspecified: Secondary | ICD-10-CM | POA: Insufficient documentation

## 2015-04-06 LAB — COMPREHENSIVE METABOLIC PANEL
ALK PHOS: 93 U/L (ref 39–117)
ALT: 9 U/L (ref 0–35)
AST: 11 U/L (ref 0–37)
Albumin: 3.6 g/dL (ref 3.5–5.2)
BUN: 22 mg/dL (ref 6–23)
CHLORIDE: 105 meq/L (ref 96–112)
CO2: 25 mEq/L (ref 19–32)
Calcium: 9.3 mg/dL (ref 8.4–10.5)
Creatinine, Ser: 1.03 mg/dL (ref 0.40–1.20)
GFR: 55.09 mL/min — AB (ref >60.00)
GLUCOSE: 229 mg/dL — AB (ref 70–99)
POTASSIUM: 4.3 meq/L (ref 3.5–5.1)
SODIUM: 141 meq/L (ref 135–145)
TOTAL PROTEIN: 7.1 g/dL (ref 6.0–8.3)
Total Bilirubin: 0.5 mg/dL (ref 0.2–1.2)

## 2015-04-06 LAB — MICROALBUMIN / CREATININE URINE RATIO
Creatinine,U: 163.9 mg/dL
MICROALB/CREAT RATIO: 13.1 mg/g (ref 0.0–30.0)
Microalb, Ur: 21.4 mg/dL — ABNORMAL HIGH (ref 0.0–1.9)

## 2015-04-06 LAB — LIPID PANEL
CHOL/HDL RATIO: 3
Cholesterol: 101 mg/dL (ref 0–200)
HDL: 37.2 mg/dL — AB (ref >39.00)
LDL Cholesterol: 46 mg/dL (ref 0–99)
NONHDL: 64.08
TRIGLYCERIDES: 88 mg/dL (ref 0.0–149.0)
VLDL: 17.6 mg/dL (ref 0.0–40.0)

## 2015-04-06 LAB — HEMOGLOBIN A1C: Hgb A1c MFr Bld: 9 % — ABNORMAL HIGH (ref 4.6–6.5)

## 2015-04-06 LAB — TSH: TSH: 0.51 u[IU]/mL (ref 0.35–4.50)

## 2015-04-06 MED ORDER — INSULIN ASPART 100 UNIT/ML ~~LOC~~ SOLN: SUBCUTANEOUS | Status: DC

## 2015-04-06 MED ORDER — GENTAMICIN SULFATE 0.1 % EX OINT: 1.0000 "application " | TOPICAL_OINTMENT | Freq: Three times a day (TID) | CUTANEOUS | Status: DC

## 2015-04-06 MED ORDER — ATORVASTATIN CALCIUM 20 MG PO TABS: 20.0000 mg | ORAL_TABLET | Freq: Every day | ORAL | Status: DC

## 2015-04-06 MED ORDER — TRAMADOL HCL 50 MG PO TABS: ORAL_TABLET | ORAL | Status: DC

## 2015-04-06 NOTE — Assessment & Plan Note (Signed)
Will check lipids and LFTs with labs. 

## 2015-04-06 NOTE — Assessment & Plan Note (Signed)
She reports inability to afford Eliquis. Encouraged her to follow up with the medication assistance program at [facility].

## 2015-04-06 NOTE — Patient Instructions (Addendum)
Flu Vaccine today.  Labs today.  Start Gentamicin applied to skin lesions twice daily.  Follow up in 3 months and sooner as needed.

## 2015-04-06 NOTE — Assessment & Plan Note (Signed)
Skin lesion neck and chest and scalp most consistent with abrasions which have become superinfected. Will start topical gentamicine bid. Follow up if symptoms are not improving.

## 2015-04-06 NOTE — Assessment & Plan Note (Signed)
BP Readings from Last 3 Encounters:  04/06/15 110/73  01/12/15 118/60  12/01/14 114/52   BP well controlled. Renal function with labs. Continue current medication.

## 2015-04-06 NOTE — Progress Notes (Signed)
Pre visit review using our clinic review tool, if applicable. No additional management support is needed unless otherwise documented below in the visit note. 

## 2015-04-06 NOTE — Progress Notes (Signed)
Subjective:    Patient ID: [name] [name], female    DOB: 28-Oct-1934, 80 y.o.   MRN: [id number]  HPI  79YO female presents for follow up.  DM - Blood sugars up and down. Did not bring record today. Some blood sugars over 200. Rare BG less than 70. Misses dose of Lantus 2-3 days per week.  Obesity - Limiting food intake. Has lost nearly 10lb.  Skin lesions - Red raised areas over neck, chest and scalp. Areas are slightly itchy. Unsure how long present. No fever, chills. Unable to afford visit with dermatology.  AFIB - unable to afford Eliquis. Trying to get approval to use coupon.Has follow up with cardiology next month.  Wt Readings from Last 3 Encounters:  04/06/15 209 lb 2 oz (94.858 kg)  01/12/15 217 lb 12.8 oz (98.793 kg)  12/01/14 211 lb 8 oz (95.936 kg)   BP Readings from Last 3 Encounters:  04/06/15 110/73  01/12/15 118/60  12/01/14 114/52    Past Medical History  Diagnosis Date  . DM (diabetes mellitus) (HCC)   . PAF (paroxysmal atrial fibrillation) (HCC)     a. s/p dccv 03/2014;  b. amio/eliquis;  c. pt d/c'd amio 05/2014;  d. 06/2013 bb/ccb d/c 2/2 symptomatic bradycardia.  . Dyslipidemia   . CAD (coronary artery disease)     a. s/p prior PCI RCA with ISR req CBA;  b. 04/2013 Neg MV;  c. 05/2013 Cath: LM 20, LAD 30p, LCX 20p, OM1 90 small, RCA 62m, PDA 40-->Med Rx.  . Osteoarthritis   . Hypothyroidism     hx  . Rheumatoid arthritis(714.0)   . Obesity   . GERD (gastroesophageal reflux disease)   . Depression   . Shoulder fracture, left     Dr. Martha Clan  . Cerebral infarction (HCC)   . CKD (chronic kidney disease), stage III    Family History  Problem Relation Age of Onset  . Esophageal cancer Mother   . Diabetes Mother   . Cancer Mother     esophageal    Past Surgical History  Procedure Laterality Date  . Rca stent placement  2000  . Restenosis with ptca placement  2005  . Cataract surgery    . Partial hysterectomy    . Replacement total knee   2013    right  . Shoulder surgery  02/04/13  . Cardiac catheterization  06/11/2013  . Coronary angioplasty     Social History   Social History  . Marital Status: Married    Spouse Name: N/A  . Number of Children: 3  . Years of Education: N/A   Occupational History  . retired    Social History Main Topics  . Smoking status: Never Smoker   . Smokeless tobacco: Never Used     Comment: former passive smoker  . Alcohol Use: No  . Drug Use: No  . Sexual Activity: No   Other Topics Concern  . None   Social History Narrative   Widow; lives in [place]. Three children. 4 grandchildren; 4 great grandchildren   Regular exercise: yes   Caffeine use: tea occasionally    Review of Systems  Constitutional: Negative for fever, chills, appetite change, fatigue and unexpected weight change.  Eyes: Negative for visual disturbance.  Respiratory: Negative for cough, shortness of breath and wheezing.   Cardiovascular: Negative for chest pain and leg swelling.  Gastrointestinal: Negative for nausea, vomiting, abdominal pain, diarrhea and constipation.  Musculoskeletal: Positive for myalgias, back pain, arthralgias  and gait problem.  Skin: Positive for color change and wound. Negative for rash.  Neurological: Negative for weakness.  Hematological: Negative for adenopathy. Does not bruise/bleed easily.  Psychiatric/Behavioral: Negative for sleep disturbance and dysphoric mood. The patient is not nervous/anxious.        Objective:    BP 110/73 mmHg  Pulse 88  Temp(Src) 98.3 F (36.8 C) (Oral)  Ht 5\' 3"  (1.6 m)  Wt 209 lb 2 oz (94.858 kg)  BMI 37.05 kg/m2  SpO2 96% Physical Exam  Constitutional: She is oriented to person, place, and time. She appears well-developed and well-nourished. No distress.  HENT:  Head: Normocephalic and atraumatic.  Right Ear: External ear normal.  Left Ear: External ear normal.  Nose: Nose normal.  Mouth/Throat: Oropharynx is clear and moist. No  oropharyngeal exudate.  Eyes: Conjunctivae are normal. Pupils are equal, round, and reactive to light. Right eye exhibits no discharge. Left eye exhibits no discharge. No scleral icterus.  Neck: Normal range of motion. Neck supple. No tracheal deviation present. No thyromegaly present.  Cardiovascular: Normal rate, regular rhythm, normal heart sounds and intact distal pulses.  Exam reveals no gallop and no friction rub.   No murmur heard. Pulmonary/Chest: Effort normal and breath sounds normal. No respiratory distress. She has no wheezes. She has no rales. She exhibits no tenderness.  Musculoskeletal: Normal range of motion. She exhibits no edema or tenderness.  Lymphadenopathy:    She has no cervical adenopathy.  Neurological: She is alert and oriented to person, place, and time. No cranial nerve deficit. She exhibits normal muscle tone. Coordination normal.  Skin: Skin is warm and dry. No rash noted. She is not diaphoretic. No erythema. No pallor.  Psychiatric: She has a normal mood and affect. Her behavior is normal. Judgment and thought content normal.          Assessment & Plan:   Problem List Items Addressed This Visit      High   Hyperlipidemia    Will check lipids and LFTs with labs.      Relevant Medications   atorvastatin (LIPITOR) 20 MG tablet   Hypertension    BP Readings from Last 3 Encounters:  04/06/15 110/73  01/12/15 118/60  12/01/14 114/52   BP well controlled. Renal function with labs. Continue current medication.      Relevant Medications   atorvastatin (LIPITOR) 20 MG tablet   Hypothyroidism    Will check TSH with labs.      Relevant Orders   TSH   Poorly controlled type 2 diabetes mellitus with circulatory disorder (HCC) - Primary (Chronic)    Pt is non-compliant with insulin regimen. She reports difficulty affording Lantus. We have given her contact information for patient medication assistance through [facility] on several occasions, but she has not  followed up on this. Will check A1c with labs.      Relevant Medications   atorvastatin (LIPITOR) 20 MG tablet   insulin aspart (NOVOLOG) 100 UNIT/ML injection   Other Relevant Orders   Comprehensive metabolic panel   Hemoglobin A1c   Lipid panel   Microalbumin / creatinine urine ratio   Ambulatory referral to Ophthalmology     Unprioritized   Atrial fibrillation [facility])    She reports inability to afford Eliquis. Encouraged her to follow up with the medication assistance program at [facility].      Relevant Medications   atorvastatin (LIPITOR) 20 MG tablet   Morbid obesity (HCC)   Relevant Medications   insulin  aspart (NOVOLOG) 100 UNIT/ML injection   Skin lesion    Skin lesion neck and chest and scalp most consistent with abrasions which have become superinfected. Will start topical gentamicine bid. Follow up if symptoms are not improving.      Relevant Medications   gentamicin ointment (GARAMYCIN) 0.1 %       Return in about 3 months (around 07/07/2015).

## 2015-04-06 NOTE — Assessment & Plan Note (Signed)
Will check TSH with labs. 

## 2015-04-06 NOTE — Assessment & Plan Note (Signed)
Pt is non-compliant with insulin regimen. She reports difficulty affording Lantus. We have given her contact information for patient medication assistance through [facility] on several occasions, but she has not followed up on this. Will check A1c with labs.

## 2015-04-07 ENCOUNTER — Other Ambulatory Visit: Payer: Self-pay | Admitting: Internal Medicine

## 2015-04-07 ENCOUNTER — Telehealth: Payer: Self-pay | Admitting: *Deleted

## 2015-04-07 NOTE — Telephone Encounter (Signed)
Patient requested a call about her lab results from yesterday.

## 2015-04-12 NOTE — Telephone Encounter (Signed)
Lab results have been given to patient

## 2015-05-02 ENCOUNTER — Other Ambulatory Visit: Payer: Self-pay

## 2015-05-02 ENCOUNTER — Encounter: Payer: Self-pay | Admitting: Cardiovascular Disease

## 2015-05-02 ENCOUNTER — Ambulatory Visit (INDEPENDENT_AMBULATORY_CARE_PROVIDER_SITE_OTHER): Payer: Commercial Managed Care - HMO | Admitting: Cardiovascular Disease

## 2015-05-02 VITALS — BP 132/60 | HR 93 | Ht 63.0 in | Wt 213.8 lb

## 2015-05-02 DIAGNOSIS — R001 Bradycardia, unspecified: Secondary | ICD-10-CM | POA: Diagnosis not present

## 2015-05-02 DIAGNOSIS — E785 Hyperlipidemia, unspecified: Secondary | ICD-10-CM

## 2015-05-02 DIAGNOSIS — E1165 Type 2 diabetes mellitus with hyperglycemia: Secondary | ICD-10-CM

## 2015-05-02 DIAGNOSIS — I481 Persistent atrial fibrillation: Secondary | ICD-10-CM | POA: Diagnosis not present

## 2015-05-02 DIAGNOSIS — IMO0002 Reserved for concepts with insufficient information to code with codable children: Secondary | ICD-10-CM

## 2015-05-02 DIAGNOSIS — E1159 Type 2 diabetes mellitus with other circulatory complications: Secondary | ICD-10-CM

## 2015-05-02 DIAGNOSIS — L989 Disorder of the skin and subcutaneous tissue, unspecified: Secondary | ICD-10-CM

## 2015-05-02 DIAGNOSIS — I4819 Other persistent atrial fibrillation: Secondary | ICD-10-CM

## 2015-05-02 DIAGNOSIS — Z794 Long term (current) use of insulin: Secondary | ICD-10-CM

## 2015-05-02 MED ORDER — AMIODARONE HCL 200 MG PO TABS: 200.0000 mg | ORAL_TABLET | Freq: Two times a day (BID) | ORAL | Status: DC

## 2015-05-02 MED ORDER — METOPROLOL TARTRATE 25 MG PO TABS: 25.0000 mg | ORAL_TABLET | Freq: Two times a day (BID) | ORAL | Status: DC

## 2015-05-02 NOTE — Progress Notes (Signed)
Patient ID: [name] [name], female    DOB: 1935/04/04, 80 y.o.   MRN: [id number]  HPI Comments: [name] [name] is a 80 year old woman with a history of CAD, status post PTCA/cutting balloon angioplasty for in-stent restenosis of the RCA,   history of total right knee replacement,  also history of spinal stenosis.  Found to be in atrial fibrillation starting between September 14 and February 06 2014. Previous cardioversion December 2015  in the hospital 02/06/2014 with dizziness, general weakness, nausea, blood sugars 307 CT scan of the head showed old stroke, incidental meningioma She presents today for follow-up of her paroxysmal atrial fibrillation  In follow up today, she reports having increasing shortness of breath, palpitations over the past 2-3 weeks She has been compliant with her anticoagulation Denies any significant lower extremity edema EKG on today's visit showing atrial fibrillation with ventricular rate 107 bpm, nonspecific T wave abnormality  She attributes her atrial fibrillation to stress from taking care of her grandchildren for 3 months Continues to have problems affording the anticoagulation, does not want warfarin She continues on Lasix daily Also reports having spots on her skin, would like to talk with primary care about this  Other past medical history  cardioversion 04/11/2014 which was successful.  chronic left osteoarthritis knee pain.  admitted to the hospital at the end of May 17 2013 with discharge 05/18/2013 with diagnosis of chest pain. Cardiac enzymes were negative. She had stress test that showed no ischemia.   Repeat hospital admission 06/12/2011 with discharge 06/12/2013. Admitted with chest pain. She had cardiac catheterization that showed 60% mid RCA disease, FFR performed with the results of 0.86, no intervention done. Also with 75% ramus disease, 90% small OM disease. Medical management was recommended  Prior lab work; Total cholesterol 170,  LDL 96.    episode of chest pain in 2012, admitted to the hospital, workup including cardiac enzymes and a stress test. Stress test showed no ischemia and she was discharged home She has significant orthopedic problems. She had a fall in the recent past, broke her left shoulder. Scheduled for surgery on September 18.  Previous Hemoglobin A1c 8.3. After cortisone shot     Allergies  Allergen Reactions  . Oxycodone Nausea And Vomiting    Outpatient Encounter Prescriptions as of 05/02/2015  Medication Sig  . albuterol (PROVENTIL HFA;VENTOLIN HFA) 108 (90 BASE) MCG/ACT inhaler Inhale 2 puffs into the lungs every 6 (six) hours as needed for wheezing or shortness of breath.  Marland Kitchen amiodarone (PACERONE) 200 MG tablet Take 1 tablet (200 mg total) by mouth 2 (two) times daily.  Marland Kitchen amLODipine (NORVASC) 10 MG tablet Take 1 tablet (10 mg total) by mouth daily.  Marland Kitchen apixaban (ELIQUIS) 5 MG TABS tablet TAKE 1 TABLET BY MOUTH 2 TIMES A DAY.  Marland Kitchen atorvastatin (LIPITOR) 20 MG tablet Take 1 tablet (20 mg total) by mouth daily.  . ferrous sulfate 325 (65 FE) MG tablet Take 325 mg by mouth daily with breakfast.  . fluticasone (FLONASE) 50 MCG/ACT nasal spray Place 2 sprays into the nose daily.  . furosemide (LASIX) 40 MG tablet Take 1 tablet (40 mg total) by mouth daily.  Marland Kitchen gabapentin (NEURONTIN) 300 MG capsule Take 1 capsule (300 mg total) by mouth 3 (three) times daily.  Marland Kitchen gentamicin ointment (GARAMYCIN) 0.1 % Apply 1 application topically 3 (three) times daily.  . insulin aspart (NOVOLOG) 100 UNIT/ML injection Before each meal 3 times a day, 140-199 - 2 units, 200-250 - 4 units,  251-299 - 6 units,  300-349 - 8 units,  350 or above 10 units. Insulin PEN if approved, provide syringes and needles if needed.  . insulin glargine (LANTUS) 100 UNIT/ML injection Inject 0.5 mLs (50 Units total) into the skin at bedtime.  Marland Kitchen latanoprost (XALATAN) 0.005 % ophthalmic solution Place 1 drop into both eyes at bedtime.   Marland Kitchen  levothyroxine (SYNTHROID, LEVOTHROID) 150 MCG tablet Take 1 tablet (150 mcg total) by mouth daily.  Marland Kitchen lisinopril (PRINIVIL,ZESTRIL) 10 MG tablet TAKE 1 TABLET EVERY DAY  . meclizine (ANTIVERT) 25 MG tablet Take 1 tablet (25 mg total) by mouth 2 (two) times daily. As needed for dizziness or nausea (Patient taking differently: Take 25 mg by mouth 2 (two) times daily as needed. As needed for dizziness or nausea)  . metFORMIN (GLUCOPHAGE) 500 MG tablet Take 1 tablet (500 mg total) by mouth 2 (two) times daily.  . nitroGLYCERIN (NITROSTAT) 0.4 MG SL tablet Place 1 tablet (0.4 mg total) under the tongue every 5 (five) minutes as needed.  Marland Kitchen NOVOLIN R RELION 100 UNIT/ML injection INJECT 4 UNITS SUBCUTANEOUSLY IF  BLOOD  SUGAR  IS  GREATER  THAN  250  . omeprazole (PRILOSEC) 20 MG capsule Take 1 capsule (20 mg total) by mouth 2 (two) times daily.  . potassium chloride SA (K-DUR,KLOR-CON) 20 MEQ tablet Take 1 tablet (20 mEq total) by mouth daily.  . sertraline (ZOLOFT) 50 MG tablet Take 1 tablet (50 mg total) by mouth daily.  . traMADol (ULTRAM) 50 MG tablet TAKE 2 TABLETS BY MOUTH EVERY 8 HOURS AS NEEDED FOR MODERATE PAIN  . trimethoprim-polymyxin b (POLYTRIM) ophthalmic solution Place 1 drop into both eyes every 4 (four) hours.  . [DISCONTINUED] amiodarone (PACERONE) 100 MG tablet Take 1 tablet (100 mg total) by mouth daily.  . metoprolol tartrate (LOPRESSOR) 25 MG tablet Take 1 tablet (25 mg total) by mouth 2 (two) times daily.   No facility-administered encounter medications on file as of 05/02/2015.    Past Medical History  Diagnosis Date  . DM (diabetes mellitus) (HCC)   . PAF (paroxysmal atrial fibrillation) (HCC)     a. s/p dccv 03/2014;  b. amio/eliquis;  c. pt d/c'd amio 05/2014;  d. 06/2013 bb/ccb d/c 2/2 symptomatic bradycardia.  . Dyslipidemia   . CAD (coronary artery disease)     a. s/p prior PCI RCA with ISR req CBA;  b. 04/2013 Neg MV;  c. 05/2013 Cath: LM 20, LAD 30p, LCX 20p, OM1 90  small, RCA 46m, PDA 40-->Med Rx.  . Osteoarthritis   . Hypothyroidism     hx  . Rheumatoid arthritis(714.0)   . Obesity   . GERD (gastroesophageal reflux disease)   . Depression   . Shoulder fracture, left     Dr. Martha Clan  . Cerebral infarction (HCC)   . CKD (chronic kidney disease), stage III     Past Surgical History  Procedure Laterality Date  . Rca stent placement  2000  . Restenosis with ptca placement  2005  . Cataract surgery    . Partial hysterectomy    . Replacement total knee  2013    right  . Shoulder surgery  02/04/13  . Cardiac catheterization  06/11/2013  . Coronary angioplasty      Social History  reports that she has never smoked. She has never used smokeless tobacco. She reports that she does not drink alcohol or use illicit drugs.  Family History family history includes Cancer in her mother;  Diabetes in her mother; Esophageal cancer in her mother.   Review of Systems  Constitutional: Negative.   Respiratory: Negative.   Cardiovascular: Negative.   Gastrointestinal: Negative.   Musculoskeletal: Positive for back pain, arthralgias and gait problem.  Neurological: Negative.   Hematological: Negative.   Psychiatric/Behavioral: Negative.   All other systems reviewed and are negative.   BP 132/60 mmHg  Pulse 93  Ht 5\' 3"  (1.6 m)  Wt 213 lb 12.8 oz (96.979 kg)  BMI 37.88 kg/m2  Physical Exam  Constitutional: She is oriented to person, place, and time. She appears well-developed and well-nourished.  obese  HENT:  Head: Normocephalic.  Nose: Nose normal.  Mouth/Throat: Oropharynx is clear and moist.  Eyes: Conjunctivae are normal. Pupils are equal, round, and reactive to light.  Neck: Normal range of motion. Neck supple. No JVD present.  Cardiovascular: Normal rate, S1 normal, S2 normal, normal heart sounds and intact distal pulses.  An irregularly irregular rhythm present. Exam reveals no gallop and no friction rub.   No murmur  heard. Pulmonary/Chest: Effort normal and breath sounds normal. No respiratory distress. She has no wheezes. She has no rales. She exhibits no tenderness.  Abdominal: Soft. Bowel sounds are normal. She exhibits no distension. There is no tenderness.  Musculoskeletal: Normal range of motion. She exhibits no edema or tenderness.  Lymphadenopathy:    She has no cervical adenopathy.  Neurological: She is alert and oriented to person, place, and time. Coordination normal.  Skin: Skin is warm and dry. No rash noted. No erythema.  Psychiatric: She has a normal mood and affect. Her behavior is normal. Judgment and thought content normal.    Assessment and Plan  Nursing note and vitals reviewed.

## 2015-05-02 NOTE — Patient Instructions (Addendum)
You are doing well.  Stay on eliquis twice a day Please start amiodarone 400 mg twice a day for 5 days then down to 200 mg twice a day  Please start metoprolol one pill twice a day  Please call us if you have new issues that need to be addressed before your next appt.  Your physician wants you to follow-up in: EKG in 2 weeks nurse visit Follow up With Dr. Mariah Milling in one month

## 2015-05-02 NOTE — Assessment & Plan Note (Signed)
She has recurrence of her skin lesions. Reports that she needs antibiotics Suggested she talk with Dr. Dan Humphreys. Could also consider discussing with dermatology if no improvement

## 2015-05-02 NOTE — Assessment & Plan Note (Signed)
Cholesterol is at goal on the current lipid regimen. No changes to the medications were made.  

## 2015-05-02 NOTE — Assessment & Plan Note (Signed)
We have encouraged continued exercise, careful diet management in an effort to lose weight. 

## 2015-05-02 NOTE — Assessment & Plan Note (Signed)
We will need to watch her closely for bradycardia. This may be exacerbated by higher dose amiodarone and beta blocker

## 2015-05-02 NOTE — Patient Outreach (Signed)
[facility]) Care Management  05/02/2015  [name] [name] 07-Sep-1934 [id number]  Telephone call to patient regarding [employer] High risk referral.  Unable to reach patient or leave voice messages on home or mobile number due to only ringing.  PLAN; RNCM will attempt 2nd telephone outreach to patient within 1 week.   George Ina RN,BSN,CCM [facility] Telephonic Care Coordinator [phone]

## 2015-05-02 NOTE — Assessment & Plan Note (Signed)
We will increase her amiodarone back up to 400 mg twice a day for 5 days then 200 mg twice a day with EKG nurse visit in 2 weeks. Also recommended she start metoprolol 25 mill grams twice a day. Previously not on beta blockers and diltiazem secondary to symptomatic bradycardia We'll try to convert her back to normal sinus rhythm chemically Several times she has reported strict compliance with her anticoagulation, takes this pill twice a day

## 2015-05-03 ENCOUNTER — Other Ambulatory Visit: Payer: Self-pay

## 2015-05-07 ENCOUNTER — Encounter: Payer: Self-pay | Admitting: Emergency Medicine

## 2015-05-07 ENCOUNTER — Emergency Department: Payer: Commercial Managed Care - HMO

## 2015-05-07 ENCOUNTER — Emergency Department
Admission: EM | Admit: 2015-05-07 | Discharge: 2015-05-08 | Disposition: A | Discharge status: Home / Self Care | Payer: Commercial Managed Care - HMO | Attending: Emergency Medicine | Admitting: Emergency Medicine

## 2015-05-07 DIAGNOSIS — E119 Type 2 diabetes mellitus without complications: Secondary | ICD-10-CM | POA: Insufficient documentation

## 2015-05-07 DIAGNOSIS — I129 Hypertensive chronic kidney disease with stage 1 through stage 4 chronic kidney disease, or unspecified chronic kidney disease: Secondary | ICD-10-CM | POA: Diagnosis not present

## 2015-05-07 DIAGNOSIS — Z79899 Other long term (current) drug therapy: Secondary | ICD-10-CM | POA: Diagnosis not present

## 2015-05-07 DIAGNOSIS — Y9289 Other specified places as the place of occurrence of the external cause: Secondary | ICD-10-CM | POA: Diagnosis not present

## 2015-05-07 DIAGNOSIS — S3991XA Unspecified injury of abdomen, initial encounter: Secondary | ICD-10-CM | POA: Diagnosis not present

## 2015-05-07 DIAGNOSIS — Z794 Long term (current) use of insulin: Secondary | ICD-10-CM | POA: Diagnosis not present

## 2015-05-07 DIAGNOSIS — N183 Chronic kidney disease, stage 3 (moderate): Secondary | ICD-10-CM | POA: Insufficient documentation

## 2015-05-07 DIAGNOSIS — W01198A Fall on same level from slipping, tripping and stumbling with subsequent striking against other object, initial encounter: Secondary | ICD-10-CM | POA: Diagnosis not present

## 2015-05-07 DIAGNOSIS — Z7951 Long term (current) use of inhaled steroids: Secondary | ICD-10-CM | POA: Diagnosis not present

## 2015-05-07 DIAGNOSIS — Y998 Other external cause status: Secondary | ICD-10-CM | POA: Diagnosis not present

## 2015-05-07 DIAGNOSIS — S2231XA Fracture of one rib, right side, initial encounter for closed fracture: Secondary | ICD-10-CM

## 2015-05-07 DIAGNOSIS — S29001A Unspecified injury of muscle and tendon of front wall of thorax, initial encounter: Secondary | ICD-10-CM | POA: Diagnosis present

## 2015-05-07 DIAGNOSIS — Z7984 Long term (current) use of oral hypoglycemic drugs: Secondary | ICD-10-CM | POA: Diagnosis not present

## 2015-05-07 DIAGNOSIS — Y9389 Activity, other specified: Secondary | ICD-10-CM | POA: Insufficient documentation

## 2015-05-07 DIAGNOSIS — S2241XA Multiple fractures of ribs, right side, initial encounter for closed fracture: Secondary | ICD-10-CM | POA: Insufficient documentation

## 2015-05-07 DIAGNOSIS — T1490XA Injury, unspecified, initial encounter: Secondary | ICD-10-CM

## 2015-05-07 DIAGNOSIS — W19XXXA Unspecified fall, initial encounter: Secondary | ICD-10-CM

## 2015-05-07 HISTORY — DX: Unspecified asthma, uncomplicated: J45.909

## 2015-05-07 LAB — COMPREHENSIVE METABOLIC PANEL
ALT: 10 U/L — ABNORMAL LOW (ref 14–54)
ANION GAP: 8 (ref 5–15)
AST: 19 U/L (ref 15–41)
Albumin: 3.7 g/dL (ref 3.5–5.0)
Alkaline Phosphatase: 99 U/L (ref 38–126)
BILIRUBIN TOTAL: 1.1 mg/dL (ref 0.3–1.2)
BUN: 20 mg/dL (ref 6–20)
CO2: 27 mmol/L (ref 22–32)
Calcium: 9.3 mg/dL (ref 8.9–10.3)
Chloride: 105 mmol/L (ref 101–111)
Creatinine, Ser: 0.9 mg/dL (ref 0.44–1.00)
GFR, EST NON AFRICAN AMERICAN: 60 mL/min — AB (ref >60)
Glucose, Bld: 246 mg/dL — ABNORMAL HIGH (ref 65–99)
POTASSIUM: 3.8 mmol/L (ref 3.5–5.1)
Sodium: 140 mmol/L (ref 135–145)
TOTAL PROTEIN: 7.7 g/dL (ref 6.5–8.1)

## 2015-05-07 LAB — CBC WITH DIFFERENTIAL/PLATELET
Basophils Absolute: 0.1 10*3/uL (ref 0–0.1)
Basophils Relative: 1 %
EOS PCT: 1 %
Eosinophils Absolute: 0.1 10*3/uL (ref 0–0.7)
HEMATOCRIT: 34 % — AB (ref 35.0–47.0)
Hemoglobin: 11.2 g/dL — ABNORMAL LOW (ref 12.0–16.0)
LYMPHS PCT: 15 %
Lymphs Abs: 1.6 10*3/uL (ref 1.0–3.6)
MCH: 27.4 pg (ref 26.0–34.0)
MCHC: 32.9 g/dL (ref 32.0–36.0)
MCV: 83.2 fL (ref 80.0–100.0)
MONO ABS: 0.8 10*3/uL (ref 0.2–0.9)
MONOS PCT: 8 %
NEUTROS ABS: 8.1 10*3/uL — AB (ref 1.4–6.5)
Neutrophils Relative %: 75 %
Platelets: 372 10*3/uL (ref 150–440)
RBC: 4.09 MIL/uL (ref 3.80–5.20)
RDW: 15.3 % — AB (ref 11.5–14.5)
WBC: 10.7 10*3/uL (ref 3.6–11.0)

## 2015-05-07 LAB — LIPASE, BLOOD: LIPASE: 22 U/L (ref 11–51)

## 2015-05-07 MED ORDER — HYDROMORPHONE HCL 1 MG/ML IJ SOLN
INTRAMUSCULAR | Status: AC
  Administered 2015-05-07: 1 mg via INTRAVENOUS
  Filled 2015-05-07: qty 1

## 2015-05-07 MED ORDER — IOHEXOL 300 MG/ML  SOLN
100.0000 mL | Freq: Once | INTRAMUSCULAR | Status: AC | PRN
  Administered 2015-05-07: 100 mL via INTRAVENOUS
  Filled 2015-05-07: qty 100

## 2015-05-07 MED ORDER — IOHEXOL 240 MG/ML SOLN
25.0000 mL | Freq: Once | INTRAMUSCULAR | Status: AC | PRN
  Administered 2015-05-07: 25 mL via ORAL
  Filled 2015-05-07: qty 25

## 2015-05-07 MED ORDER — ONDANSETRON HCL 4 MG/2ML IJ SOLN
INTRAMUSCULAR | Status: AC
  Administered 2015-05-07: 4 mg
  Filled 2015-05-07: qty 2

## 2015-05-07 MED ORDER — MORPHINE SULFATE (PF) 4 MG/ML IV SOLN
INTRAVENOUS | Status: AC
  Administered 2015-05-07: 4 mg
  Filled 2015-05-07: qty 1

## 2015-05-07 MED ORDER — SODIUM CHLORIDE 0.9 % IV BOLUS (SEPSIS)
1000.0000 mL | Freq: Once | INTRAVENOUS | Status: AC
  Administered 2015-05-07: 1000 mL via INTRAVENOUS

## 2015-05-07 MED ORDER — HYDROMORPHONE HCL 1 MG/ML IJ SOLN
1.0000 mg | Freq: Once | INTRAMUSCULAR | Status: AC
  Administered 2015-05-07: 1 mg via INTRAVENOUS

## 2015-05-07 NOTE — ED Notes (Signed)
Pt presents to ED via POV with c/o of right side rib pain. Pt states she was bending over a washer too deep into affected area. Pt states initial pain was sharp thought pt assumed pain would subside with time. Pt denies any pain medicine/home remedies taken at home. Pt is alert and oriented x4. Pt states she is experiencing accompanying chest pain and SOB.

## 2015-05-07 NOTE — ED Notes (Signed)
Friend accompanying patient is Ixchel Duck [phone].

## 2015-05-07 NOTE — ED Provider Notes (Signed)
[facility] Emergency Department Provider Note  ____________________________________________  Time seen: Approximately 9:40 PM  I have reviewed the triage vital signs and the nursing notes.   HISTORY  Chief Complaint Abdominal Pain    HPI [name] [name] is a 80 y.o. female who presents emergency department complaining of right rib pain and right upper quadrant pain. Per the patient she has had some nausea and vomiting the last 2-3 days. She states that the vomit has been a bright yellow/green color. She has had no pain to right upper quadrant with associated nausea and vomiting. Per the patient she was leaning over her dryer removing close this evening when she slipped and fell hitting the right side of her rib cage against the drier. She felt a "popping sensation" has since had right-sided rib/chest pain. She now endorses severe right upper quadrant pain in addition to her rib pain. She states that pain and right upper quadrant is a "spasmodic" sensation. It is present constantly but continues to wax and wane and spasmodic nature. She denies any vomiting since injury but endorses some nausea. She denies any shortness of breath. Patient did not hit head or lose consciousness and in time.  Patient denies any history of gallbladder, liver, and creatinine problems. Patient does endorse a possible history of renal issues.   Past Medical History  Diagnosis Date  . DM (diabetes mellitus) (HCC)   . PAF (paroxysmal atrial fibrillation) (HCC)     a. s/p dccv 03/2014;  b. amio/eliquis;  c. pt d/c'd amio 05/2014;  d. 06/2013 bb/ccb d/c 2/2 symptomatic bradycardia.  . Dyslipidemia   . CAD (coronary artery disease)     a. s/p prior PCI RCA with ISR req CBA;  b. 04/2013 Neg MV;  c. 05/2013 Cath: LM 20, LAD 30p, LCX 20p, OM1 90 small, RCA 17m, PDA 40-->Med Rx.  . Osteoarthritis   . Hypothyroidism     hx  . Rheumatoid arthritis(714.0)   . Obesity   . GERD (gastroesophageal  reflux disease)   . Depression   . Shoulder fracture, left     Dr. Martha Clan  . Cerebral infarction (HCC)   . CKD (chronic kidney disease), stage III   . Asthma   . CHF (congestive heart failure) (HCC)   . Hypertension   . Renal insufficiency     Patient Active Problem List   Diagnosis Date Noted  . Skin lesion 04/06/2015  . Routine history and physical examination of adult 01/13/2015  . Conjunctivitis 09/27/2014  . Dermatitis 06/27/2014  . Pre-syncope 06/25/2014  . CKD (chronic kidney disease), stage III   . Bradycardia 06/24/2014  . Lower GI bleeding 05/25/2014  . Hematochezia 05/16/2014  . Ulnar nerve compression 03/30/2014  . Diabetes mellitus type 2, uncontrolled (HCC) 03/25/2014  . Depression 03/25/2014  . Vertigo 02/17/2014  . Medicare annual wellness visit, subsequent 01/10/2014  . Anxiety state, unspecified 01/10/2014  . Abdominal pain, epigastric 01/10/2014  . Carotid stenosis 11/16/2013  . CVA (cerebral infarction) 10/21/2013  . Diabetic eye exam (HCC) 09/03/2013  . Right hip pain 08/04/2013  . Preop examination 01/27/2013  . Screening for breast cancer 01/14/2013  . Poorly controlled type 2 diabetes mellitus with circulatory disorder (HCC) 02/04/2012  . Preoperative cardiovascular examination 10/29/2011  . Chronic UTI 06/12/2011  . GERD (gastroesophageal reflux disease) 06/12/2011  . Hyperlipidemia 06/12/2011  . Hypertension 05/17/2011  . Hypothyroidism 03/07/2011  . Chronic back pain 02/21/2011  . Morbid obesity (HCC) 08/17/2008  . CAD (coronary  artery disease) 08/17/2008  . Atrial fibrillation (HCC) 08/17/2008  . Osteoarthritis, knee 08/17/2008    Past Surgical History  Procedure Laterality Date  . Rca stent placement  2000  . Restenosis with ptca placement  2005  . Cataract surgery    . Partial hysterectomy    . Replacement total knee  2013    right  . Shoulder surgery  02/04/13  . Cardiac catheterization  06/11/2013  . Coronary angioplasty       Current Outpatient Rx  Name  Route  Sig  Dispense  Refill  . albuterol (PROVENTIL HFA;VENTOLIN HFA) 108 (90 BASE) MCG/ACT inhaler   Inhalation   Inhale 2 puffs into the lungs every 6 (six) hours as needed for wheezing or shortness of breath.   1 Inhaler   0   . amiodarone (PACERONE) 200 MG tablet   Oral   Take 1 tablet (200 mg total) by mouth 2 (two) times daily.   70 tablet   3   . amLODipine (NORVASC) 10 MG tablet   Oral   Take 1 tablet (10 mg total) by mouth daily.   30 tablet   5   . apixaban (ELIQUIS) 5 MG TABS tablet      TAKE 1 TABLET BY MOUTH 2 TIMES A DAY.   60 tablet   3   . atorvastatin (LIPITOR) 20 MG tablet   Oral   Take 1 tablet (20 mg total) by mouth daily.   90 tablet   3   . ferrous sulfate 325 (65 FE) MG tablet   Oral   Take 325 mg by mouth daily with breakfast.         . fluticasone (FLONASE) 50 MCG/ACT nasal spray   Nasal   Place 2 sprays into the nose daily.   16 g   3   . furosemide (LASIX) 40 MG tablet   Oral   Take 1 tablet (40 mg total) by mouth daily.   180 tablet   3   . gabapentin (NEURONTIN) 300 MG capsule   Oral   Take 1 capsule (300 mg total) by mouth 3 (three) times daily.   270 capsule   3   . gentamicin ointment (GARAMYCIN) 0.1 %   Topical   Apply 1 application topically 3 (three) times daily.   15 g   0   . HYDROcodone-acetaminophen (NORCO/VICODIN) 5-325 MG tablet   Oral   Take 1-2 tablets by mouth every 6 (six) hours as needed for moderate pain.   30 tablet   0   . insulin aspart (NOVOLOG) 100 UNIT/ML injection      Before each meal 3 times a day, 140-199 - 2 units, 200-250 - 4 units, 251-299 - 6 units,  300-349 - 8 units,  350 or above 10 units. Insulin PEN if approved, provide syringes and needles if needed.   10 mL   11   . insulin glargine (LANTUS) 100 UNIT/ML injection   Subcutaneous   Inject 0.5 mLs (50 Units total) into the skin at bedtime.   30 mL   3   . latanoprost (XALATAN) 0.005 %  ophthalmic solution   Both Eyes   Place 1 drop into both eyes at bedtime.          Marland Kitchen levothyroxine (SYNTHROID, LEVOTHROID) 150 MCG tablet   Oral   Take 1 tablet (150 mcg total) by mouth daily.   30 tablet   3   . lisinopril (PRINIVIL,ZESTRIL) 10 MG tablet  TAKE 1 TABLET EVERY DAY   90 tablet   3   . meclizine (ANTIVERT) 25 MG tablet   Oral   Take 1 tablet (25 mg total) by mouth 2 (two) times daily. As needed for dizziness or nausea Patient taking differently: Take 25 mg by mouth 2 (two) times daily as needed. As needed for dizziness or nausea   60 tablet   3   . metFORMIN (GLUCOPHAGE) 500 MG tablet   Oral   Take 1 tablet (500 mg total) by mouth 2 (two) times daily.   180 tablet   3   . metoprolol tartrate (LOPRESSOR) 25 MG tablet   Oral   Take 1 tablet (25 mg total) by mouth 2 (two) times daily.   180 tablet   3   . nitroGLYCERIN (NITROSTAT) 0.4 MG SL tablet   Sublingual   Place 1 tablet (0.4 mg total) under the tongue every 5 (five) minutes as needed.   25 tablet   3   . NOVOLIN R RELION 100 UNIT/ML injection      INJECT 4 UNITS SUBCUTANEOUSLY IF  BLOOD  SUGAR  IS  GREATER  THAN  250   10 vial   2   . omeprazole (PRILOSEC) 20 MG capsule   Oral   Take 1 capsule (20 mg total) by mouth 2 (two) times daily.   180 capsule   3   . ondansetron (ZOFRAN-ODT) 4 MG disintegrating tablet   Oral   Take 1 tablet (4 mg total) by mouth every 8 (eight) hours as needed for nausea or vomiting.   20 tablet   0   . potassium chloride SA (K-DUR,KLOR-CON) 20 MEQ tablet   Oral   Take 1 tablet (20 mEq total) by mouth daily.   180 tablet   3   . sertraline (ZOLOFT) 50 MG tablet   Oral   Take 1 tablet (50 mg total) by mouth daily.   90 tablet   0   . traMADol (ULTRAM) 50 MG tablet      TAKE 2 TABLETS BY MOUTH EVERY 8 HOURS AS NEEDED FOR MODERATE PAIN   90 tablet   1   . trimethoprim-polymyxin b (POLYTRIM) ophthalmic solution   Both Eyes   Place 1 drop into  both eyes every 4 (four) hours.   10 mL   0     Allergies Oxycodone  Family History  Problem Relation Age of Onset  . Esophageal cancer Mother   . Diabetes Mother   . Cancer Mother     esophageal     Social History Social History  Substance Use Topics  . Smoking status: Never Smoker   . Smokeless tobacco: Never Used     Comment: former passive smoker  . Alcohol Use: No    Review of Systems Constitutional: No fever/chills Eyes: No visual changes. ENT: No sore throat. Cardiovascular: Denies chest pain. Nurses right-sided rib pain. Respiratory: Denies shortness of breath. Gastrointestinal: Endorses severe right upper quadrant abdominal pain..  Endorses nausea and vomiting..  No diarrhea.  No constipation. Genitourinary: Negative for dysuria. Musculoskeletal: Negative for back pain. Skin: Negative for rash. Neurological: Negative for headaches, focal weakness or numbness.  10-point ROS otherwise negative.  ____________________________________________   PHYSICAL EXAM:  VITAL SIGNS: ED Triage Vitals  Enc Vitals Group     BP 05/07/15 1944 143/78 mmHg     Pulse Rate 05/07/15 1944 84     Resp 05/07/15 1944 20     Temp  05/07/15 1944 97.9 F (36.6 C)     Temp Source 05/07/15 1944 Oral     SpO2 --      Weight 05/07/15 1944 200 lb (90.719 kg)     Height 05/07/15 1944 5\' 2"  (1.575 m)     Head Cir --      Peak Flow --      Pain Score 05/07/15 1944 10     Pain Loc --      Pain Edu? --      Excl. in GC? --     Constitutional: Alert and oriented. Well appearing and in no acute distress. Eyes: Conjunctivae are normal. PERRL. EOMI. Head: Atraumatic. Nose: No congestion/rhinnorhea. Mouth/Throat: Mucous membranes are moist.  Oropharynx non-erythematous. Neck: No stridor.   Hematological/Lymphatic/Immunilogical: No cervical lymphadenopathy. Cardiovascular: Normal rate, regular rhythm. Grossly normal heart sounds.  Good peripheral circulation. Respiratory: Normal  respiratory effort.  No retractions. Lungs CTAB. Gastrointestinal: No visible abnormality to abdominal wall. No ecchymosis or contusion noted. Patient is extremely tender to palpation in the right upper quadrant. Guarding noted to right upper quadrant. No rigidity noted. No masses noted. Bowel sounds noted 4 quadrants. Patient is nontender to palpation left upper, left lower, and right lower quadrants of the abdomen. No distention. No abdominal bruits. No CVA tenderness. Musculoskeletal: No lower extremity tenderness nor edema.  No joint effusions. No visible deformity to right chest wall upon inspection. No bruising, ecchymosis, flail segment, paradoxical movement. Patient is diffusely tender to palpation over the right ribs in the fifth and sixth rib position. No palpable abnormality. No paradoxical chest wall movement. Neurologic:  Normal speech and language. No gross focal neurologic deficits are appreciated. No gait instability. Skin:  Skin is warm, dry and intact. No rash noted. Psychiatric: Mood and affect are normal. Speech and behavior are normal.  ____________________________________________   LABS (all labs ordered are listed, but only abnormal results are displayed)  Labs Reviewed  CBC WITH DIFFERENTIAL/PLATELET - Abnormal; Notable for the following:    Hemoglobin 11.2 (*)    HCT 34.0 (*)    RDW 15.3 (*)    Neutro Abs 8.1 (*)    All other components within normal limits  COMPREHENSIVE METABOLIC PANEL - Abnormal; Notable for the following:    Glucose, Bld 246 (*)    ALT 10 (*)    GFR calc non Af Amer 60 (*)    All other components within normal limits  LIPASE, BLOOD   ____________________________________________  EKG  EKG reveals atrial fibrillation. No ST elevation or depression noted. QRS interval is within normal limits. No Q waves or delta waves present.  These findings are consistent with known atrial fibrillation and patient. Previous EKG is reviewed with no  significant changes. ____________________________________________  RADIOLOGY  Rib x-ray with chest Impression: No fracture. No pneumothorax. Heart size and mediastinal contours are within normal limits.  CT abdomen and pelvis Impression: Nondisplaced right eighth rib fracture. No acute intra-abdominal finding. ____________________________________________   PROCEDURES  Procedure(s) performed: None  Critical Care performed: No  ____________________________________________   INITIAL IMPRESSION / ASSESSMENT AND PLAN / ED COURSE  Pertinent labs & imaging results that were available during my care of the patient were reviewed by me and considered in my medical decision making (see chart for details).  The patient is a 80 year old female who presents emergency department complaining of right-sided rib pain and right upper quadrant abdominal pain. Patient states that she did have a history of nausea and vomiting with bilious vomit  3-4 days prior to this. She denied any abdominal pain with these complaints. She states that she was bending over her dryer tonight when she slipped and fell making contact with her ribs against the edge. She states she felt a popping sensation and it began to experience right-sided chest pain as well as right upper quadrant pain. Patient was initially evaluated with right-sided rib x-ray and EKG. EKG reveals atrial fibrillation which is consistent with known history. Previous EKG is compared with current with no significant changes. Patient was endorsing sharp spasmodic right upper quadrant pain. At that time patient was evaluated with labs, CT scan of the abdomen pelvis and given medications here in the emergency department. Laboratory results returned without any acute abnormality to explain symptoms. AST, ALT, bilirubin, lipase are all unremarkable. Patient does not have a white blood cell count. CT returns with a nondisplaced rib fracture to the eighth rib. No  acute findings in the abdomen. This finding of a rib fracture does explain patient's symptoms. Patient is given incentive spirometry to use at home. She is given narcotics for pain control. Patient is instructed to follow-up with primary care provider. Patient will be also sent home with Zofran for control of previous nausea and vomiting symptoms as well as preventing narcotic-induced nausea.  Patient is instructed to stop metformin for 48 hours due to CT contrast administration. Patient verbalizes compliance with same. ____________________________________________   FINAL CLINICAL IMPRESSION(S) / ED DIAGNOSES  Final diagnoses:  Rib fracture, right, closed, initial encounter  Fall, initial encounter      Racheal Patches, PA-C 05/08/15 0019  Arnaldo Natal, MD 05/12/15 [phone]

## 2015-05-08 MED ORDER — HYDROMORPHONE HCL 1 MG/ML IJ SOLN
INTRAMUSCULAR | Status: AC
  Administered 2015-05-08: 1 mg via INTRAVENOUS
  Filled 2015-05-08: qty 1

## 2015-05-08 MED ORDER — HYDROMORPHONE HCL 1 MG/ML IJ SOLN
1.0000 mg | Freq: Once | INTRAMUSCULAR | Status: AC
  Administered 2015-05-08: 1 mg via INTRAVENOUS

## 2015-05-08 MED ORDER — ONDANSETRON HCL 4 MG/2ML IJ SOLN
4.0000 mg | Freq: Once | INTRAMUSCULAR | Status: AC
  Administered 2015-05-08: 4 mg via INTRAVENOUS

## 2015-05-08 MED ORDER — ONDANSETRON HCL 4 MG/2ML IJ SOLN: 4.0000 mg | Freq: Once | INTRAMUSCULAR | Status: DC

## 2015-05-08 MED ORDER — ONDANSETRON 4 MG PO TBDP: 4.0000 mg | ORAL_TABLET | Freq: Three times a day (TID) | ORAL | Status: DC | PRN

## 2015-05-08 MED ORDER — HYDROMORPHONE HCL 1 MG/ML IJ SOLN: 1.0000 mg | Freq: Once | INTRAMUSCULAR | Status: DC

## 2015-05-08 MED ORDER — ONDANSETRON HCL 4 MG/2ML IJ SOLN
INTRAMUSCULAR | Status: AC
  Administered 2015-05-08: 4 mg via INTRAVENOUS
  Filled 2015-05-08: qty 2

## 2015-05-08 MED ORDER — HYDROCODONE-ACETAMINOPHEN 5-325 MG PO TABS: 1.0000 | ORAL_TABLET | Freq: Four times a day (QID) | ORAL | Status: DC | PRN

## 2015-05-08 NOTE — Discharge Instructions (Signed)
Blunt Chest Trauma °Blunt chest trauma is an injury caused by a blow to the chest. These chest injuries can be very painful. Blunt chest trauma often results in bruised or broken (fractured) ribs. Most cases of bruised and fractured ribs from blunt chest traumas get better after 1 to 3 weeks of rest and pain medicine. Often, the soft tissue in the chest wall is also injured, causing pain and bruising. Internal organs, such as the heart and lungs, may also be injured. Blunt chest trauma can lead to serious medical problems. This injury requires immediate medical care. °CAUSES  °· Motor vehicle collisions. °· Falls. °· Physical violence. °· Sports injuries. °SYMPTOMS  °· Chest pain. The pain may be worse when you move or breathe deeply. °· Shortness of breath. °· Lightheadedness. °· Bruising. °· Tenderness. °· Swelling. °DIAGNOSIS  °Your caregiver will do a physical exam. X-rays may be taken to look for fractures. However, minor rib fractures may not show up on X-rays until a few days after the injury. If a more serious injury is suspected, further imaging tests may be done. This may include ultrasounds, computed tomography (CT) scans, or magnetic resonance imaging (MRI). °TREATMENT  °Treatment depends on the severity of your injury. Your caregiver may prescribe pain medicines and deep breathing exercises. °HOME CARE INSTRUCTIONS °· Limit your activities until you can move around without much pain. °· Do not do any strenuous work until your injury is healed. °· Put ice on the injured area. °¨ Put ice in a plastic bag. °¨ Place a towel between your skin and the bag. °¨ Leave the ice on for 15-20 minutes, 03-04 times a day. °· You may wear a rib belt as directed by your caregiver to reduce pain. °· Practice deep breathing as directed by your caregiver to keep your lungs clear. °· Only take over-the-counter or prescription medicines for pain, fever, or discomfort as directed by your caregiver. °SEEK IMMEDIATE MEDICAL  CARE IF:  °· You have increasing pain or shortness of breath. °· You cough up blood. °· You have nausea, vomiting, or abdominal pain. °· You have a fever. °· You feel dizzy, weak, or you faint. °MAKE SURE YOU: °· Understand these instructions. °· Will watch your condition. °· Will get help right away if you are not doing well or get worse. °  °This information is not intended to replace advice given to you by your health care provider. Make sure you discuss any questions you have with your health care provider. °  °Document Released: 06/13/2004 Document Revised: 05/27/2014 Document Reviewed: 11/02/2014 °Elsevier Interactive Patient Education ©2016 [employer] Inc[employer] ° °Rib Fracture °A rib fracture is a break or crack in one of the bones of the ribs. The ribs are a group of long, curved bones that wrap around your chest and attach to your spine. They protect your lungs and other organs in the chest cavity. A broken or cracked rib is often painful, but most do not cause other problems. Most rib fractures heal on their own over time. However, rib fractures can be more serious if multiple ribs are broken or if broken ribs move out of place and push against other structures. °CAUSES  °· A direct blow to the chest. For example, this could happen during contact sports, a car accident, or a fall against a hard object. °· Repetitive movements with high force, such as pitching a baseball or having severe coughing spells. °SYMPTOMS  °· Pain when you breathe in or cough. °·   Pain when someone presses on the injured area. °DIAGNOSIS  °Your caregiver will perform a physical exam. Various imaging tests may be ordered to confirm the diagnosis and to look for related injuries. These tests may include a chest X-ray, computed tomography (CT), magnetic resonance imaging (MRI), or a bone scan. °TREATMENT  °Rib fractures usually heal on their own in 1-3 months. The longer healing period is often associated with a continued cough or other  aggravating activities. During the healing period, pain control is very important. Medication is usually given to control pain. Hospitalization or surgery may be needed for more severe injuries, such as those in which multiple ribs are broken or the ribs have moved out of place.  °HOME CARE INSTRUCTIONS  °· Avoid strenuous activity and any activities or movements that cause pain. Be careful during activities and avoid bumping the injured rib. °· Gradually increase activity as directed by your caregiver. °· Only take over-the-counter or prescription medications as directed by your caregiver. Do not take other medications without asking your caregiver first. °· Apply ice to the injured area for the first 1-2 days after you have been treated or as directed by your caregiver. Applying ice helps to reduce inflammation and pain. °¨ Put ice in a plastic bag. °¨ Place a towel between your skin and the bag.   °¨ Leave the ice on for 15-20 minutes at a time, every 2 hours while you are awake. °· Perform deep breathing as directed by your caregiver. This will help prevent pneumonia, which is a common complication of a broken rib. Your caregiver may instruct you to: °¨ Take deep breaths several times a day. °¨ Try to cough several times a day, holding a pillow against the injured area. °¨ Use a device called an incentive spirometer to practice deep breathing several times a day. °· Drink enough fluids to keep your urine clear or pale yellow. This will help you avoid constipation.   °· Do not wear a rib belt or binder. These restrict breathing, which can lead to pneumonia.   °SEEK IMMEDIATE MEDICAL CARE IF:  °· You have a fever.   °· You have difficulty breathing or shortness of breath.   °· You develop a continual cough, or you cough up thick or bloody sputum. °· You feel sick to your stomach (nausea), throw up (vomit), or have abdominal pain.   °· You have worsening pain not controlled with medications.   °MAKE SURE  YOU: °· Understand these instructions. °· Will watch your condition. °· Will get help right away if you are not doing well or get worse. °  °This information is not intended to replace advice given to you by your health care provider. Make sure you discuss any questions you have with your health care provider. °  °Document Released: 05/06/2005 Document Revised: 01/06/2013 Document Reviewed: 07/08/2012 °Elsevier Interactive Patient Education ©2016 Elsevier Inc. ° °

## 2015-05-19 NOTE — Patient Outreach (Signed)
[facility]) Care Management  05/19/2015  [name] [name] 1935/02/08 [id number]  Late entry:  05/03/15 SUBJECTIVE:  Telephone call to patient regarding [employer] high risk referral.  HIPAA verified with patient. Patient states she will not have [employer] insurance May 20, 2014.  Patient states she has seen her primary MD within the past month but has not heard anything regarding her lab work. Patient states she does not need any services at this time.  Patient repeated that she would not have [employer] insurance by May 20, 2014.  Patient states she will have [employer].   RNCM advised patient to contact her primary MD office regarding her lab results. Patient states she had called and left a message but she will call again.   PLAN; RNCM will close patient due to refusal of services. RNCM will refer to Damita Rhodie to close. RNCM will notify patients primary MD of closure.   George Ina RN,BSN,CCM [facility] Telephonic Care Coordinator [phone]

## 2015-06-06 ENCOUNTER — Ambulatory Visit (INDEPENDENT_AMBULATORY_CARE_PROVIDER_SITE_OTHER): Payer: Medicare Other

## 2015-06-06 VITALS — BP 168/72 | Ht 63.0 in | Wt 205.2 lb

## 2015-06-06 DIAGNOSIS — I4891 Unspecified atrial fibrillation: Secondary | ICD-10-CM

## 2015-06-06 NOTE — Progress Notes (Signed)
1.) Reason for visit: EKG  2.) Name of MD requesting visit: Dr. Mariah Milling  3.) H&P: Pt was advised on 05/02/15:     Stay on eliquis twice a day  Please start amiodarone 400 mg twice a day  for 5 days then down to 200 mg twice a day   Please start metoprolol one pill twice a day  4.) ROS related to problem: Pt is currently taking amiodarone 200 mg BID.  She would like to decrease this dose, if possible.  She will need a refill sent in, as she will run out of pills by the end of the week, as well as Eliquis.  She requests samples, but we are currently out, expecting more today.  She will try to have her daughter pick these up, as it is difficult for her to get back out.  She reports considerable pain and swelling in her left knee 2/2 fall in December.  Reports the pain is worse when she walks, she declines use of wheelchair while in the building.    5.) Assessment and plan per MD: Provided pt w/ educational material concerning her diagnosis. Advised her that Dr. Mariah Milling will review her EKG and we will call her w/ his recommendation.

## 2015-06-06 NOTE — Patient Instructions (Signed)

## 2015-06-13 ENCOUNTER — Other Ambulatory Visit: Payer: Self-pay | Admitting: Internal Medicine

## 2015-06-14 ENCOUNTER — Ambulatory Visit: Payer: Medicare Other | Admitting: Family Medicine

## 2015-06-15 ENCOUNTER — Ambulatory Visit (INDEPENDENT_AMBULATORY_CARE_PROVIDER_SITE_OTHER)
Admission: RE | Admit: 2015-06-15 | Discharge: 2015-06-15 | Disposition: A | Discharge status: Home / Self Care | Payer: Medicare Other | Source: Ambulatory Visit | Attending: Family Medicine | Admitting: Family Medicine

## 2015-06-15 ENCOUNTER — Ambulatory Visit (INDEPENDENT_AMBULATORY_CARE_PROVIDER_SITE_OTHER): Payer: Medicare Other | Admitting: Family Medicine

## 2015-06-15 ENCOUNTER — Encounter: Payer: Self-pay | Admitting: Family Medicine

## 2015-06-15 ENCOUNTER — Telehealth: Payer: Self-pay | Admitting: Family Medicine

## 2015-06-15 VITALS — BP 128/92 | HR 59 | Temp 98.1°F | Ht 63.0 in | Wt 212.6 lb

## 2015-06-15 DIAGNOSIS — M25511 Pain in right shoulder: Secondary | ICD-10-CM | POA: Diagnosis not present

## 2015-06-15 DIAGNOSIS — R0602 Shortness of breath: Secondary | ICD-10-CM

## 2015-06-15 DIAGNOSIS — M542 Cervicalgia: Secondary | ICD-10-CM | POA: Diagnosis not present

## 2015-06-15 DIAGNOSIS — I5031 Acute diastolic (congestive) heart failure: Secondary | ICD-10-CM

## 2015-06-15 DIAGNOSIS — S2231XA Fracture of one rib, right side, initial encounter for closed fracture: Secondary | ICD-10-CM

## 2015-06-15 DIAGNOSIS — M25562 Pain in left knee: Secondary | ICD-10-CM | POA: Diagnosis not present

## 2015-06-15 DIAGNOSIS — M1712 Unilateral primary osteoarthritis, left knee: Secondary | ICD-10-CM

## 2015-06-15 DIAGNOSIS — M79601 Pain in right arm: Secondary | ICD-10-CM

## 2015-06-15 LAB — COMPREHENSIVE METABOLIC PANEL
ALT: 21 U/L (ref 0–35)
AST: 18 U/L (ref 0–37)
Albumin: 3.7 g/dL (ref 3.5–5.2)
Alkaline Phosphatase: 101 U/L (ref 39–117)
BILIRUBIN TOTAL: 0.7 mg/dL (ref 0.2–1.2)
BUN: 23 mg/dL (ref 6–23)
CALCIUM: 9.4 mg/dL (ref 8.4–10.5)
CHLORIDE: 105 meq/L (ref 96–112)
CO2: 28 meq/L (ref 19–32)
CREATININE: 1.09 mg/dL (ref 0.40–1.20)
GFR: 51.58 mL/min — ABNORMAL LOW (ref >60.00)
GLUCOSE: 181 mg/dL — AB (ref 70–99)
Potassium: 4.2 mEq/L (ref 3.5–5.1)
Sodium: 140 mEq/L (ref 135–145)
Total Protein: 7 g/dL (ref 6.0–8.3)

## 2015-06-15 LAB — TROPONIN I: TNIDX: 0.03 ug/L (ref 0.00–0.06)

## 2015-06-15 LAB — BRAIN NATRIURETIC PEPTIDE: Pro B Natriuretic peptide (BNP): 391 pg/mL — ABNORMAL HIGH (ref 0.0–100.0)

## 2015-06-15 NOTE — Progress Notes (Signed)
Patient ID: [name] [name], female   DOB: Feb 03, 1935, 81 y.o.   MRN: [id number]  Tommi Rumps, MD Phone: [phone]  [name] [name] is a 81 y.o. female who presents today for same-day visit.  Shortness of breath: Patient notes shortness of breath for the last several months. She notes if she does anything she gets short of breath. She states she bent over to pull up her pant leg that she was short of breath. She notes she is short of breath with walking around. She endorses orthopnea and PND. No edema. No chest pain. No cough. No palpitations. She has on Eliquis for A. Fib.  Right arm pain: Patient notes pain radiating up from her right wrist to her neck on the right side. She is unable to describe the pain. She notes some numbness and weakness in the arm. She is unable to drive with her right arm. She denies any neck pain. She notes this occurred after she fell in December and hit her ribs. She also notes some left knee discomfort with this after the fall. She notes she had pulled herself up with her right arm and on her left knee. She did not hit her left knee on the fall or hit her right arm or neck on the fall.  She continues to also note discomfort in her right ribs. Sometimes it hurts when she takes a deep breath. She's not taking any pain medication for this.  PMH: nonsmoker.   ROS see HPI  Objective  Physical Exam Filed Vitals:   06/15/15 1000  BP: 128/92  Pulse: 59  Temp: 98.1 F (36.7 C)   Wt Readings from Last 3 Encounters:  06/15/15 212 lb 9.6 oz (96.435 kg)  06/06/15 205 lb 4 oz (93.101 kg)  05/07/15 200 lb (90.719 kg)   Ambulatory O2 sat 97%  Physical Exam  Constitutional: No distress.  HENT:  Head: Normocephalic and atraumatic.  Right Ear: External ear normal.  Left Ear: External ear normal.  Mouth/Throat: Oropharynx is clear and moist. No oropharyngeal exudate.  Eyes: Conjunctivae are normal. Pupils are equal, round, and reactive to light.  Neck: Neck  supple.  Cardiovascular: Normal rate, regular rhythm and normal heart sounds.  Exam reveals no gallop and no friction rub.   No murmur heard. Pulmonary/Chest: Effort normal. No respiratory distress. She has no wheezes.  Mild Bibasilar crackles  Abdominal: Soft. Bowel sounds are normal. She exhibits no distension. There is no tenderness. There is no rebound and no guarding.  Musculoskeletal:  No midline spine tenderness, no midline neck tenderness, no midline spine or neck step-off, no muscular back tenderness, right shoulder with no tenderness or swelling, patient with pain on internal and external rotation that limits rotation, left shoulder with no tenderness or swelling, full range of motion with no pain, minimal tenderness over left lower ribs, left knee with medial and lateral joint line tenderness, no swelling, no ligamentous laxity, no warmth, no erythema, negative McMurray's, right knee with no tenderness, swelling, erythema, ligamentous laxity, or warmth, negative McMurray's  Lymphadenopathy:    She has no cervical adenopathy.  Neurological: She is alert.  CN 2-12 intact, 5/5 strength in bilateral biceps, triceps, grip, quads, hamstrings, plantar and dorsiflexion, sensation to light touch intact in bilateral UE and LE, normal gait, 2+ patellar reflexes  Skin: Skin is warm and dry. She is not diaphoretic.   no flail chest, no bruising over her ribs  EKG: A. fib, rate 72, no ST or T-wave changes  Assessment/Plan: Please see individual problem list.  Acute diastolic heart failure (HCC) Patient's symptoms of shortness of breath, orthopnea, and PND most consistent with exacerbation of her diastolic heart failure. Last echo had grade 1 diastolic dysfunction. EF of 60-65% in 2015. Given her shortness of breath and other comorbid conditions we discussed having her evaluated in the emergency room for her shortness of breath, though she declined this and signed [place] paperwork. She opted for  outpatient workup. We will obtain a BNP and troponin. Her vital signs are stable and she is already on Eliquis making VTE unlikely cause. Doubt pneumonia given stable vital signs and lack of fever. We will obtain a chest x-ray to workup this issue as well. We'll check a CMP as well. She's been advised to increase her Lasix to 40 mg twice daily through the weekend. She's been advised to follow-up with her cardiologist. She is given return precautions.  Right arm pain Patient with discomfort in her right arm radiating from her distal arm up to her neck. Also with discomfort in her right shoulder on internal and external rotation. Her discomfort could be coming from her shoulder and could be related to bony issue or rotator cuff issue. Could also be coming from her neck. She is neurovascularly intact. We will obtain x-ray imaging of her neck and shoulder to evaluate this further. She is given return precautions.  Osteoarthritis, knee Patient with left knee pain status post fall in to ribs and attempted to get up. She has a history of arthritis in this knee. She does have some joint line tenderness which could be related to meniscal issue, though given her history of arthritis is more likely related to the arthritis. We will obtain x-ray imaging of her left knee to evaluate for fracture and arthritis. She is given return precautions.  Rib fracture Seen on prior CT scan in December 2016. Still with mild discomfort in the area of the fracture. No evidence of flail chest. Normal vital signs. We'll obtain a chest x-ray to evaluate further. Given return precautions.    Orders Placed This Encounter  Procedures  . DG Chest 2 View    Standing Status: Future     Number of Occurrences: 1     Standing Expiration Date: 08/12/2016    Order Specific Question:  Reason for Exam (SYMPTOM  OR DIAGNOSIS REQUIRED)    Answer:  shortness of breath, bibasilar crackles    Order Specific Question:  Preferred imaging  location?    Answer:  [facility]  . DG Cervical Spine Complete    Standing Status: Future     Number of Occurrences: 1     Standing Expiration Date: 08/12/2016    Order Specific Question:  Reason for Exam (SYMPTOM  OR DIAGNOSIS REQUIRED)    Answer:  shortness of breath, bibasilar crackles    Order Specific Question:  Preferred imaging location?    Answer:  [facility]  . DG Shoulder Right    Standing Status: Future     Number of Occurrences: 1     Standing Expiration Date: 08/12/2016    Order Specific Question:  Reason for Exam (SYMPTOM  OR DIAGNOSIS REQUIRED)    Answer:  shortness of breath, bibasilar crackles    Order Specific Question:  Preferred imaging location?    Answer:  [place] Knee Complete 4 Views Left    Standing Status: Future     Number of Occurrences: 1     Standing Expiration  Date: 08/12/2016    Order Specific Question:  Reason for Exam (SYMPTOM  OR DIAGNOSIS REQUIRED)    Answer:  shortness of breath, bibasilar crackles    Order Specific Question:  Preferred imaging location?    Answer:  [place] ([facility])  . B Nat Peptide  . Troponin I  . EKG 12-Lead    Tommi Rumps

## 2015-06-15 NOTE — Patient Instructions (Addendum)
Nice to meet you. We will check some lab work to evaluate you for heart failure. You need to increase your Lasix to 40 mg twice daily.  Please go get the x-rays that we have ordered. Please try to move up your cardiology appointment. I would like for you to follow-up on Monday in the office. If you develop chest pain, worsening shortness of breath, change in heart rate, increasing pain, numbness, weakness, fevers, or any new or changing symptoms please seek medical attention.

## 2015-06-15 NOTE — Progress Notes (Signed)
Pre visit review using our clinic review tool, if applicable. No additional management support is needed unless otherwise documented below in the visit note. 

## 2015-06-15 NOTE — Telephone Encounter (Signed)
Called and spoke with patient regarding x-ray results and lab work. It appears that she may be mildly volume overloaded with a BNP being elevated. Advised that she should take the Lasix twice daily as she is supposed to for the next several days and then have to follow up early next week in our office for recheck. She was advised to follow-up with cardiology as well. I discussed the possible fracture in her right shoulder that may be an old fracture. Advised that we could do an MRI or refer her for further evaluation. She is unsure what she wanted to do regarding this. She will think about it and let us know. She is given return precautions.

## 2015-06-16 DIAGNOSIS — M79601 Pain in right arm: Secondary | ICD-10-CM | POA: Insufficient documentation

## 2015-06-16 DIAGNOSIS — S2239XA Fracture of one rib, unspecified side, initial encounter for closed fracture: Secondary | ICD-10-CM | POA: Insufficient documentation

## 2015-06-16 DIAGNOSIS — I5031 Acute diastolic (congestive) heart failure: Secondary | ICD-10-CM | POA: Insufficient documentation

## 2015-06-16 NOTE — Assessment & Plan Note (Signed)
Seen on prior CT scan in December 2016. Still with mild discomfort in the area of the fracture. No evidence of flail chest. Normal vital signs. We'll obtain a chest x-ray to evaluate further. Given return precautions.

## 2015-06-16 NOTE — Progress Notes (Signed)
Last 2 EKGs showing atrial fibrillation If she is interested, would schedule cardioversion as it does not look like pills alone will converted to normal sinus rhythm. Would only perform cardioversion if she has had strict compliance with her anticoagulation, eliquis twice a day without missing any doses. If she is interested in cardioversion, would stay on amiodarone twice a day for now as that we'll increase the likelihood that she will hold normal sinus rhythm following a procedure.  If she is not interested in cardioversion, she will likely stay in atrial fibrillation permanently We would likely stop amiodarone and use other medications to control the speed Would continue anticoagulation twice a day

## 2015-06-16 NOTE — Progress Notes (Addendum)
Spoke w/ pt.  Advised her of Dr. Windell Hummingbird recommendation. She reports that she fell after Christmas and still has a considerable amount of pain.  She is undecided if she will proceed w/ any surgery at this time, as she does not have anyone that can care for her dog if she were hospitalized.  She would like to hold off on any procedures at this time, as she is concerned about a cardioversion not being permanent at this time.  She would like to discuss this further at her appt w/ Dr. Mariah Milling on 06/27/15.  Advised her that I will make him aware and call her back if he has any recommendations before that time.

## 2015-06-16 NOTE — Assessment & Plan Note (Signed)
Patient with left knee pain status post fall in to ribs and attempted to get up. She has a history of arthritis in this knee. She does have some joint line tenderness which could be related to meniscal issue, though given her history of arthritis is more likely related to the arthritis. We will obtain x-ray imaging of her left knee to evaluate for fracture and arthritis. She is given return precautions.

## 2015-06-16 NOTE — Assessment & Plan Note (Addendum)
Patient's symptoms of shortness of breath, orthopnea, and PND most consistent with exacerbation of her diastolic heart failure. Last echo had grade 1 diastolic dysfunction. EF of 60-65% in 2015. Given her shortness of breath and other comorbid conditions we discussed having her evaluated in the emergency room for her shortness of breath, though she declined this and signed [facility] paperwork. She opted for outpatient workup. We will obtain a BNP and troponin. Her vital signs are stable and she is already on Eliquis making VTE unlikely cause. Doubt pneumonia given stable vital signs and lack of fever. We will obtain a chest x-ray to workup this issue as well. We'll check a CMP as well. She's been advised to increase her Lasix to 40 mg twice daily through the weekend. She's been advised to follow-up with her cardiologist. She is given return precautions.

## 2015-06-16 NOTE — Assessment & Plan Note (Signed)
Patient with discomfort in her right arm radiating from her distal arm up to her neck. Also with discomfort in her right shoulder on internal and external rotation. Her discomfort could be coming from her shoulder and could be related to bony issue or rotator cuff issue. Could also be coming from her neck. She is neurovascularly intact. We will obtain x-ray imaging of her neck and shoulder to evaluate this further. She is given return precautions.

## 2015-06-27 ENCOUNTER — Ambulatory Visit: Payer: Commercial Managed Care - HMO | Admitting: Cardiovascular Disease

## 2015-07-07 ENCOUNTER — Encounter: Payer: Self-pay | Admitting: Internal Medicine

## 2015-07-07 ENCOUNTER — Ambulatory Visit (INDEPENDENT_AMBULATORY_CARE_PROVIDER_SITE_OTHER): Payer: Medicare Other | Admitting: Internal Medicine

## 2015-07-07 VITALS — BP 136/82 | HR 63 | Temp 97.8°F | Resp 16 | Ht 63.0 in | Wt 217.6 lb

## 2015-07-07 DIAGNOSIS — E1165 Type 2 diabetes mellitus with hyperglycemia: Secondary | ICD-10-CM | POA: Diagnosis not present

## 2015-07-07 DIAGNOSIS — M1712 Unilateral primary osteoarthritis, left knee: Secondary | ICD-10-CM | POA: Diagnosis not present

## 2015-07-07 DIAGNOSIS — Z794 Long term (current) use of insulin: Secondary | ICD-10-CM | POA: Diagnosis not present

## 2015-07-07 DIAGNOSIS — E1159 Type 2 diabetes mellitus with other circulatory complications: Secondary | ICD-10-CM | POA: Diagnosis not present

## 2015-07-07 DIAGNOSIS — L708 Other acne: Secondary | ICD-10-CM | POA: Diagnosis not present

## 2015-07-07 DIAGNOSIS — IMO0002 Reserved for concepts with insufficient information to code with codable children: Secondary | ICD-10-CM

## 2015-07-07 LAB — COMPREHENSIVE METABOLIC PANEL
ALT: 13 U/L (ref 0–35)
AST: 15 U/L (ref 0–37)
Albumin: 3.9 g/dL (ref 3.5–5.2)
Alkaline Phosphatase: 96 U/L (ref 39–117)
BUN: 22 mg/dL (ref 6–23)
CHLORIDE: 101 meq/L (ref 96–112)
CO2: 29 meq/L (ref 19–32)
Calcium: 9.4 mg/dL (ref 8.4–10.5)
Creatinine, Ser: 1.16 mg/dL (ref 0.40–1.20)
GFR: 48 mL/min — AB (ref >60.00)
GLUCOSE: 342 mg/dL — AB (ref 70–99)
POTASSIUM: 4.4 meq/L (ref 3.5–5.1)
SODIUM: 139 meq/L (ref 135–145)
Total Bilirubin: 0.4 mg/dL (ref 0.2–1.2)
Total Protein: 7 g/dL (ref 6.0–8.3)

## 2015-07-07 LAB — HEMOGLOBIN A1C: Hgb A1c MFr Bld: 9.7 % — ABNORMAL HIGH (ref 4.6–6.5)

## 2015-07-07 MED ORDER — DOXYCYCLINE HYCLATE 100 MG PO TABS: 100.0000 mg | ORAL_TABLET | Freq: Two times a day (BID) | ORAL | Status: DC

## 2015-07-07 NOTE — Progress Notes (Signed)
Subjective:    Patient ID: [name] [name], female    DOB: 1934-12-10, 81 y.o.   MRN: [id number]  HPI  81YO female presents for follow up.  Not feeling well. Called today with low BG in 60s. Feeling short of breath.  DM - Checks blood sugars daily. Some BG over 200. Occasional blood sugars below 70. Compliant with medication.  Dyspnea - Seen by Dr. Birdie Sons for this in Jan. Increased Lasix to 40mg  daily.  Fell at home 1/27 onto wood floor. Having pain in left knee. Seen by ortho and told [name] [name] needs knee replacement.   Wt Readings from Last 3 Encounters:  07/07/15 217 lb 9.6 oz (98.703 kg)  06/15/15 212 lb 9.6 oz (96.435 kg)  06/06/15 205 lb 4 oz (93.101 kg)   BP Readings from Last 3 Encounters:  07/07/15 136/82  06/15/15 128/92  06/06/15 168/72    Past Medical History  Diagnosis Date  . DM (diabetes mellitus) (HCC)   . PAF (paroxysmal atrial fibrillation) (HCC)     a. s/p dccv 03/2014;  b. amio/eliquis;  c. pt d/c'd amio 05/2014;  d. 06/2013 bb/ccb d/c 2/2 symptomatic bradycardia.  . Dyslipidemia   . CAD (coronary artery disease)     a. s/p prior PCI RCA with ISR req CBA;  b. 04/2013 Neg MV;  c. 05/2013 Cath: LM 20, LAD 30p, LCX 20p, OM1 90 small, RCA 30m, PDA 40-->Med Rx.  . Osteoarthritis   . Hypothyroidism     hx  . Rheumatoid arthritis(714.0)   . Obesity   . GERD (gastroesophageal reflux disease)   . Depression   . Shoulder fracture, left     Dr. 72m  . Cerebral infarction (HCC)   . CKD (chronic kidney disease), stage III   . Asthma   . CHF (congestive heart failure) (HCC)   . Hypertension   . Renal insufficiency    Family History  Problem Relation Age of Onset  . Esophageal cancer Mother   . Diabetes Mother   . Cancer Mother     esophageal    Past Surgical History  Procedure Laterality Date  . Rca stent placement  2000  . Restenosis with ptca placement  2005  . Cataract surgery    . Partial hysterectomy    . Replacement total knee  2013   right  . Shoulder surgery  02/04/13  . Cardiac catheterization  06/11/2013  . Coronary angioplasty     Social History   Social History  . Marital Status: Married    Spouse Name: N/A  . Number of Children: 3  . Years of Education: N/A   Occupational History  . retired    Social History Main Topics  . Smoking status: Never Smoker   . Smokeless tobacco: Never Used     Comment: former passive smoker  . Alcohol Use: No  . Drug Use: No  . Sexual Activity: No   Other Topics Concern  . None   Social History Narrative   Widow; lives in [place]. Three children. 4 grandchildren; 4 great grandchildren   Regular exercise: yes   Caffeine use: tea occasionally    Review of Systems  Constitutional: Negative for fever, chills, appetite change, fatigue and unexpected weight change.  Eyes: Negative for visual disturbance.  Respiratory: Positive for shortness of breath. Negative for cough and wheezing.   Cardiovascular: Negative for chest pain, palpitations and leg swelling.  Gastrointestinal: Negative for nausea, vomiting, abdominal pain, diarrhea and constipation.  Musculoskeletal: Negative  for myalgias and arthralgias.  Skin: Negative for color change and rash.  Hematological: Negative for adenopathy. Does not bruise/bleed easily.  Psychiatric/Behavioral: Negative for dysphoric mood. The patient is not nervous/anxious.        Objective:    BP 136/82 mmHg  Pulse 63  Temp(Src) 97.8 F (36.6 C) (Oral)  Resp 16  Ht 5\' 3"  (1.6 m)  Wt 217 lb 9.6 oz (98.703 kg)  BMI 38.56 kg/m2  SpO2 97% Physical Exam  Constitutional: [name] [name] is oriented to person, place, and time. [name] [name] appears well-developed and well-nourished. No distress.  HENT:  Head: Normocephalic and atraumatic.  Right Ear: External ear normal.  Left Ear: External ear normal.  Nose: Nose normal.  Mouth/Throat: Oropharynx is clear and moist. No oropharyngeal exudate.  Eyes: Conjunctivae are normal. Pupils are equal, round, and  reactive to light. Right eye exhibits no discharge. Left eye exhibits no discharge. No scleral icterus.  Neck: Normal range of motion. Neck supple. No tracheal deviation present. No thyromegaly present.  Cardiovascular: Normal rate, regular rhythm, normal heart sounds and intact distal pulses.  Exam reveals no gallop and no friction rub.   No murmur heard. Pulmonary/Chest: Effort normal and breath sounds normal. No respiratory distress. [name] [name] has no wheezes. [name] [name] has no rales. [name] [name] exhibits no tenderness.  Musculoskeletal: Normal range of motion. [name] [name] exhibits no edema or tenderness.  Lymphadenopathy:    [name] [name] has no cervical adenopathy.  Neurological: [name] [name] is alert and oriented to person, place, and time. No cranial nerve deficit. [name] [name] exhibits normal muscle tone. Coordination normal.  Skin: Skin is warm and dry. No rash noted. [name] [name] is not diaphoretic. No erythema. No pallor.  Psychiatric: [name] [name] has a normal mood and affect. Her behavior is normal. Judgment and thought content normal.          Assessment & Plan:   Problem List Items Addressed This Visit      Unprioritized   Diabetes mellitus type 2, uncontrolled (HCC) - Primary    BG labile by report. Suspect some confusion with insulin. Recommended Lantus 50units daily and sliding scale Novolog prior to meals. Pt will confirm her home medications by calling after visit. A1c with labs.      Relevant Orders   Comprehensive metabolic panel   Hemoglobin A1c   Follicular acne    Scabbed areas over neck and arm most consistent with folliculitis. No improvement with gentamicin. Will add Doxycycline bid x 10 days. Follow up if no improvement.      Relevant Medications   doxycycline (VIBRA-TABS) 100 MG tablet   Osteoarthritis, knee    Left knee OA. Encouraged follow up with ortho. Will set up referral back to Dr. .      Relevant Orders   Ambulatory referral to Orthopedic Surgery       Return in about 4 weeks (around 08/04/2015) for  Recheck.

## 2015-07-07 NOTE — Progress Notes (Signed)
Pre visit review using our clinic review tool, if applicable. No additional management support is needed unless otherwise documented below in the visit note. 

## 2015-07-07 NOTE — Patient Instructions (Addendum)
Continue Lantus.  Use Novolog instead of regular insulin before meals. Call us to confirm what you have at home.  Before each meal 3 times a day,  140-199 - 2 units,  200-250 - 4 units,  251-299 - 6 units,  300-349 - 8 units,  350 or above 10 units.   Labs today.

## 2015-07-07 NOTE — Assessment & Plan Note (Signed)
Left knee OA. Encouraged follow up with ortho. Will set up referral back to Dr. Martha Clan.

## 2015-07-07 NOTE — Assessment & Plan Note (Signed)
Scabbed areas over neck and arm most consistent with folliculitis. No improvement with gentamicin. Will add Doxycycline bid x 10 days. Follow up if no improvement.

## 2015-07-07 NOTE — Assessment & Plan Note (Signed)
BG labile by report. Suspect some confusion with insulin. Recommended Lantus 50units daily and sliding scale Novolog prior to meals. Pt will confirm her home medications by calling after visit. A1c with labs.

## 2015-07-10 ENCOUNTER — Telehealth: Payer: Self-pay | Admitting: Internal Medicine

## 2015-07-10 NOTE — Telephone Encounter (Signed)
Pt states she was returning your call from Friday 07/07/15. Pt called to have medications refilled. Medications are doxycycline (VIBRA-TABS) 100 MG tablet, Pt says it was more medications but does not remember what they were. I only see one. Call Pt @ [phone]. Thank you!

## 2015-07-10 NOTE — Telephone Encounter (Signed)
Thank you. Will follow up with patient. 

## 2015-07-10 NOTE — Telephone Encounter (Signed)
Patient states she will call back tomorrow with her pharmacy of choice and prescriptions needed.

## 2015-07-12 ENCOUNTER — Telehealth: Payer: Self-pay | Admitting: Internal Medicine

## 2015-07-12 ENCOUNTER — Other Ambulatory Visit: Payer: Self-pay | Admitting: *Deleted

## 2015-07-12 DIAGNOSIS — L708 Other acne: Secondary | ICD-10-CM

## 2015-07-12 DIAGNOSIS — I10 Essential (primary) hypertension: Secondary | ICD-10-CM

## 2015-07-12 DIAGNOSIS — I159 Secondary hypertension, unspecified: Secondary | ICD-10-CM

## 2015-07-12 MED ORDER — INSULIN ASPART 100 UNIT/ML ~~LOC~~ SOLN: SUBCUTANEOUS | Status: DC

## 2015-07-12 MED ORDER — LISINOPRIL 10 MG PO TABS: 10.0000 mg | ORAL_TABLET | Freq: Every day | ORAL | Status: DC

## 2015-07-12 MED ORDER — ONDANSETRON 4 MG PO TBDP: 4.0000 mg | ORAL_TABLET | Freq: Three times a day (TID) | ORAL | Status: DC | PRN

## 2015-07-12 MED ORDER — FLUTICASONE PROPIONATE 50 MCG/ACT NA SUSP: 2.0000 | Freq: Every day | NASAL | Status: DC

## 2015-07-12 MED ORDER — INSULIN GLARGINE 100 UNIT/ML ~~LOC~~ SOLN: 50.0000 [IU] | Freq: Every day | SUBCUTANEOUS | Status: DC

## 2015-07-12 NOTE — Telephone Encounter (Signed)
Patient would like a phone consultation about her swollen left foot. Please Advise  Pt contact [phone]

## 2015-07-12 NOTE — Telephone Encounter (Signed)
Pt called needing a refill for fluticasone (FLONASE) 50 MCG/ACT nasal spray, insulin aspart (NOVOLOG) 100 UNIT/ML injection, levothyroxine (SYNTHROID, LEVOTHROID) 150 MCG tablet and ondansetron (ZOFRAN-ODT) 4 MG disintegrating tablet and a antibiotic. Pharmacy is [facility] - [place], [place] - [address] and for Insulin and antibiotic [id number] - [place], [place] - [address]. Call pt 2 [phone]. Thank you!

## 2015-07-13 ENCOUNTER — Other Ambulatory Visit: Payer: Self-pay | Admitting: Internal Medicine

## 2015-07-13 DIAGNOSIS — L708 Other acne: Secondary | ICD-10-CM

## 2015-07-13 MED ORDER — LEVOTHYROXINE SODIUM 150 MCG PO TABS: ORAL_TABLET | ORAL | Status: DC

## 2015-07-13 MED ORDER — DOXYCYCLINE HYCLATE 100 MG PO TABS: 100.0000 mg | ORAL_TABLET | Freq: Two times a day (BID) | ORAL | Status: DC

## 2015-07-13 MED ORDER — FLUTICASONE PROPIONATE 50 MCG/ACT NA SUSP: 2.0000 | Freq: Every day | NASAL | Status: DC

## 2015-07-13 MED ORDER — INSULIN ASPART 100 UNIT/ML ~~LOC~~ SOLN: SUBCUTANEOUS | Status: DC

## 2015-07-13 MED ORDER — TRAMADOL HCL 50 MG PO TABS: ORAL_TABLET | ORAL | Status: DC

## 2015-07-13 MED ORDER — FUROSEMIDE 40 MG PO TABS: 40.0000 mg | ORAL_TABLET | Freq: Every day | ORAL | Status: DC

## 2015-07-13 MED ORDER — INSULIN GLARGINE 100 UNIT/ML ~~LOC~~ SOLN: 50.0000 [IU] | Freq: Every day | SUBCUTANEOUS | Status: DC

## 2015-07-13 NOTE — Telephone Encounter (Signed)
Reason for call: left foot swollen Symptoms: pain- 9, can not bear weight on foot w/o cane Duration: 6 days Medications: lasix Last seen for this problem: yes  Seen by: Dr. Dan Humphreys  Pt is also requesting a refill on her tramadol. Pt last OV 2/17 17, last filled 03/29/15. Please advise, thanks

## 2015-07-13 NOTE — Telephone Encounter (Signed)
Needs to be seen for swollen foot.  Fine to refill Tramadol

## 2015-07-13 NOTE — Telephone Encounter (Signed)
Pt contact [phone]

## 2015-07-13 NOTE — Telephone Encounter (Signed)
Completed.

## 2015-07-13 NOTE — Telephone Encounter (Signed)
Ok

## 2015-07-13 NOTE — Telephone Encounter (Signed)
Patient stated that the pharmacy only received her Zofran.  Pt contact [phone]

## 2015-07-13 NOTE — Telephone Encounter (Signed)
Pt called back to check the status for doxycycline (VIBRA-TABS) 100 MG tablet and traMADol (ULTRAM) 50 MG tablet. Pharmacy is [facility] - [place], [place] - [address]. Thank you!

## 2015-07-14 ENCOUNTER — Telehealth: Payer: Self-pay | Admitting: Internal Medicine

## 2015-07-14 NOTE — Telephone Encounter (Signed)
Pt scheduled to come in 07/18/15 at 2:00pm for swollen foot.

## 2015-07-14 NOTE — Telephone Encounter (Signed)
Pt's daughter angela lvm stating that she would like a cb about her mothers medications. She states that she doesn't think that the dr understood what Malvika was saying about her meds.

## 2015-07-14 NOTE — Telephone Encounter (Signed)
Spoke with patient, the daughters number is not correct, Reviewed her medications with her and made sure she has all the right ones.

## 2015-07-17 ENCOUNTER — Telehealth: Payer: Self-pay | Admitting: *Deleted

## 2015-07-17 NOTE — Telephone Encounter (Signed)
Does anybody have any openings we can put her in?

## 2015-07-17 NOTE — Telephone Encounter (Signed)
Patient c/o Palpitations:  High priority if patient c/o lightheadedness and shortness of breath.  1. How long have you been having palpitations? Thurs  07/13/15  2. Are you currently experiencing lightheadedness and shortness of breath? Yes both  3. Have you checked your BP and heart rate? (document readings) no  4. Are you experiencing any other symptoms? Feels like it's gurgling

## 2015-07-17 NOTE — Telephone Encounter (Signed)
Spoke w/ pt.  She reports that her PCP told her that her  She feels that her heart will "stop and then gurgle from the bottom".  She reports that she has an appt w/ PCP tomorrow to look at her feet, as they are swollen.  She reports SOBOE, takes lasix 40 mg daily. Advised pt to keep her feel elevated, limit her water to only when she is thirsty, wrap ankles w/ ACE bandage, take an extra lasix after lunch today. Pt placed on waiting list in the event of a cancellation. Advised her that I will make Dr. Mariah Milling aware and call her back if he has any further recommendations.

## 2015-07-17 NOTE — Telephone Encounter (Signed)
Would try to get in with any of our docs/PAs ASAP, Likely having arrhythmia

## 2015-07-17 NOTE — Telephone Encounter (Signed)
Does [facility] have any openings?

## 2015-07-18 ENCOUNTER — Encounter: Payer: Self-pay | Admitting: Internal Medicine

## 2015-07-18 ENCOUNTER — Ambulatory Visit (INDEPENDENT_AMBULATORY_CARE_PROVIDER_SITE_OTHER): Payer: Medicare Other | Admitting: Internal Medicine

## 2015-07-18 VITALS — BP 132/73 | HR 63 | Temp 98.0°F | Ht 63.0 in | Wt 225.5 lb

## 2015-07-18 DIAGNOSIS — E1159 Type 2 diabetes mellitus with other circulatory complications: Secondary | ICD-10-CM

## 2015-07-18 DIAGNOSIS — E1165 Type 2 diabetes mellitus with hyperglycemia: Secondary | ICD-10-CM | POA: Diagnosis not present

## 2015-07-18 DIAGNOSIS — R0600 Dyspnea, unspecified: Secondary | ICD-10-CM | POA: Diagnosis not present

## 2015-07-18 DIAGNOSIS — I5031 Acute diastolic (congestive) heart failure: Secondary | ICD-10-CM | POA: Diagnosis not present

## 2015-07-18 LAB — BRAIN NATRIURETIC PEPTIDE: Pro B Natriuretic peptide (BNP): 321 pg/mL — ABNORMAL HIGH (ref 0.0–100.0)

## 2015-07-18 LAB — TROPONIN I: TNIDX: 0.02 ug/l (ref 0.00–0.06)

## 2015-07-18 NOTE — Assessment & Plan Note (Signed)
Few low BG in the 50s in the mornings. However A1c elevated. Will try to change dosing of Lantus to morning. Monitor closely. Recheck Friday.

## 2015-07-18 NOTE — Assessment & Plan Note (Signed)
Symptoms most consistent with acute on chronic CHF. Recommended ER evaluation and IV furosemide, CXR, cardiac markers, given failure of improvement with Furosemide 40mg  po bid. Pt declines. Will increase Furosemide to 80mg  in the morning and 40mg  at lunch. BNP and troponin with labs. Recheck on Friday.

## 2015-07-18 NOTE — Patient Instructions (Addendum)
Change Lantus dosing to morning. Continue to monitor blood sugars.  We will send two blood tests to measure strain on your heart today.  We will set up ECHO for further evaluation.  Please increase Furosemide to 80mg  in the morning and 40mg  at lunchtime.  Follow up Friday. If any worsening shortness of breath prior to Friday, then you will need to be seen.

## 2015-07-18 NOTE — Assessment & Plan Note (Signed)
Progressive dyspnea, lower extremity edema and orthopnea c/w acute exacerbation of heart failure. Recommended evaluation in the ED for IV Furosemide, CXR, Cardiac Markers. She refuses. Will check BNP and Troponin in the office.Increase Furosemide to 80mg  in the morning and 40mg  at lunch. Recheck on Friday. Strongly encouraged her to seek care and go to the ER for evaluation if progressive dyspnea.

## 2015-07-18 NOTE — Progress Notes (Signed)
Subjective:    Patient ID: [name], female    DOB: August 29, 1934, 81 y.o.   MRN: [id number]  HPI  81YO female presents for acute visit for foot swelling.  Foot swelling - Both feet have been swelling since last week.  Also feeling very short of breath since last week. Dyspnea occurs both at rest, and with exertion, but much worse with exertion, such as walking from her kitchen to her bedroom at home. No changes to medication. Compliant with medication. No chest pain. Notes some wheezing in the mornings when she wakes up. Sometimes wakes gasping for breath. No cough. No previous sleep study. Sometimes wakes up gasping. Taking Furosemide 40mg  twice daily.  DM - BG mostly 90s to low 200s. Has a few BG in 50s in the mornings.   Wt Readings from Last 3 Encounters:  07/18/15 225 lb 8 oz (102.286 kg)  07/07/15 217 lb 9.6 oz (98.703 kg)  06/15/15 212 lb 9.6 oz (96.435 kg)   BP Readings from Last 3 Encounters:  07/18/15 132/73  07/07/15 136/82  06/15/15 128/92    Past Medical History  Diagnosis Date  . DM (diabetes mellitus) (HCC)   . PAF (paroxysmal atrial fibrillation) (HCC)     a. s/p dccv 03/2014;  b. amio/eliquis;  c. pt d/c'd amio 05/2014;  d. 06/2013 bb/ccb d/c 2/2 symptomatic bradycardia.  . Dyslipidemia   . CAD (coronary artery disease)     a. s/p prior PCI RCA with ISR req CBA;  b. 04/2013 Neg MV;  c. 05/2013 Cath: LM 20, LAD 30p, LCX 20p, OM1 90 small, RCA 44m, PDA 40-->Med Rx.  . Osteoarthritis   . Hypothyroidism     hx  . Rheumatoid arthritis(714.0)   . Obesity   . GERD (gastroesophageal reflux disease)   . Depression   . Shoulder fracture, left     Dr. Martha Clan  . Cerebral infarction (HCC)   . CKD (chronic kidney disease), stage III   . Asthma   . CHF (congestive heart failure) (HCC)   . Hypertension   . Renal insufficiency    Family History  Problem Relation Age of Onset  . Esophageal cancer Mother   . Diabetes Mother   . Cancer Mother    esophageal    Past Surgical History  Procedure Laterality Date  . Rca stent placement  2000  . Restenosis with ptca placement  2005  . Cataract surgery    . Partial hysterectomy    . Replacement total knee  2013    right  . Shoulder surgery  02/04/13  . Cardiac catheterization  06/11/2013  . Coronary angioplasty     Social History   Social History  . Marital Status: Married    Spouse Name: N/A  . Number of Children: 3  . Years of Education: N/A   Occupational History  . retired    Social History Main Topics  . Smoking status: Never Smoker   . Smokeless tobacco: Never Used     Comment: former passive smoker  . Alcohol Use: No  . Drug Use: No  . Sexual Activity: No   Other Topics Concern  . None   Social History Narrative   Widow; lives in [place]. Three children. 4 grandchildren; 4 great grandchildren   Regular exercise: yes   Caffeine use: tea occasionally    Review of Systems  Constitutional: Positive for fatigue. Negative for fever, chills and unexpected weight change.  HENT: Negative for congestion, ear discharge, ear  pain, facial swelling, hearing loss, mouth sores, nosebleeds, postnasal drip, rhinorrhea, sinus pressure, sneezing, sore throat, tinnitus, trouble swallowing and voice change.   Eyes: Negative for pain, discharge, redness and visual disturbance.  Respiratory: Positive for chest tightness, shortness of breath and wheezing. Negative for cough and stridor.   Cardiovascular: Positive for leg swelling. Negative for chest pain and palpitations.  Musculoskeletal: Negative for myalgias, arthralgias, neck pain and neck stiffness.  Skin: Negative for color change and rash.  Neurological: Negative for dizziness, weakness, light-headedness and headaches.  Hematological: Negative for adenopathy.  Psychiatric/Behavioral: Positive for sleep disturbance.       Objective:    BP 132/73 mmHg  Pulse 63  Temp(Src) 98 F (36.7 C) (Oral)  Ht 5\' 3"  (1.6 m)  Wt  225 lb 8 oz (102.286 kg)  BMI 39.96 kg/m2  SpO2 98% Physical Exam  Constitutional: She is oriented to person, place, and time. She appears well-developed and well-nourished. No distress.  HENT:  Head: Normocephalic and atraumatic.  Right Ear: External ear normal.  Left Ear: External ear normal.  Nose: Nose normal.  Mouth/Throat: Oropharynx is clear and moist. No oropharyngeal exudate.  Eyes: Conjunctivae are normal. Pupils are equal, round, and reactive to light. Right eye exhibits no discharge. Left eye exhibits no discharge. No scleral icterus.  Neck: Normal range of motion. Neck supple. No tracheal deviation present. No thyromegaly present.  Cardiovascular: Normal rate, regular rhythm, normal heart sounds and intact distal pulses.  Exam reveals no gallop and no friction rub.   No murmur heard. Pulmonary/Chest: Accessory muscle usage (with any exertion, including activities such as moving in chair for EKG) present. Tachypnea noted. No respiratory distress. She has no decreased breath sounds. She has no wheezes. She has no rhonchi. She has rales (scattered throughout). She exhibits no tenderness.  Musculoskeletal: Normal range of motion. She exhibits no edema or tenderness.  Lymphadenopathy:    She has no cervical adenopathy.  Neurological: She is alert and oriented to person, place, and time. No cranial nerve deficit. She exhibits normal muscle tone. Coordination normal.  Skin: Skin is warm and dry. No rash noted. She is not diaphoretic. No erythema. No pallor.  Psychiatric: She has a normal mood and affect. Her behavior is normal. Judgment and thought content normal.          Assessment & Plan:  Over of which >50% spent in face-to-face contact with patient discussing plan of care  Problem List Items Addressed This Visit      High   Poorly controlled type 2 diabetes mellitus with circulatory disorder (HCC) (Chronic)    Few low BG in the 50s in the mornings. However A1c  elevated. Will try to change dosing of Lantus to morning. Monitor closely. Recheck Friday.        Unprioritized   Acute diastolic heart failure (HCC) - Primary    Symptoms most consistent with acute on chronic CHF. Recommended ER evaluation and IV furosemide, CXR, cardiac markers, given failure of improvement with Furosemide 40mg  po bid. Pt declines. Will increase Furosemide to 80mg  in the morning and 40mg  at lunch. BNP and troponin with labs. Recheck on Friday.      Relevant Orders   B Nat Peptide   Troponin I   Dyspnea    Progressive dyspnea, lower extremity edema and orthopnea c/w acute exacerbation of heart failure. Recommended evaluation in the ED for IV Furosemide, CXR, Cardiac Markers. She refuses. Will check BNP and Troponin in the office.Increase Furosemide  to 80mg  in the morning and 40mg  at lunch. Recheck on Friday. Strongly encouraged her to seek care and go to the ER for evaluation if progressive dyspnea.      Relevant Orders   EKG 12-Lead (Completed)   ECHOCARDIOGRAM COMPLETE       Return in about 3 days (around 07/21/2015) for Recheck.  Wednesday, MD Internal Medicine [facility]

## 2015-07-19 ENCOUNTER — Encounter: Payer: Self-pay | Admitting: Emergency Medicine

## 2015-07-19 ENCOUNTER — Emergency Department: Payer: Medicare Other

## 2015-07-19 ENCOUNTER — Inpatient Hospital Stay
Admission: EM | Admit: 2015-07-19 | Discharge: 2015-07-27 | DRG: 291 | Disposition: A | Discharge status: Home Health | Payer: Medicare Other | Attending: Internal Medicine | Admitting: Internal Medicine

## 2015-07-19 DIAGNOSIS — R0602 Shortness of breath: Secondary | ICD-10-CM

## 2015-07-19 DIAGNOSIS — I5032 Chronic diastolic (congestive) heart failure: Secondary | ICD-10-CM | POA: Diagnosis present

## 2015-07-19 DIAGNOSIS — E1165 Type 2 diabetes mellitus with hyperglycemia: Secondary | ICD-10-CM | POA: Diagnosis present

## 2015-07-19 DIAGNOSIS — Z794 Long term (current) use of insulin: Secondary | ICD-10-CM | POA: Diagnosis not present

## 2015-07-19 DIAGNOSIS — I4819 Other persistent atrial fibrillation: Secondary | ICD-10-CM

## 2015-07-19 DIAGNOSIS — M1990 Unspecified osteoarthritis, unspecified site: Secondary | ICD-10-CM | POA: Diagnosis present

## 2015-07-19 DIAGNOSIS — Z8673 Personal history of transient ischemic attack (TIA), and cerebral infarction without residual deficits: Secondary | ICD-10-CM | POA: Diagnosis not present

## 2015-07-19 DIAGNOSIS — I481 Persistent atrial fibrillation: Secondary | ICD-10-CM | POA: Diagnosis present

## 2015-07-19 DIAGNOSIS — N183 Chronic kidney disease, stage 3 unspecified: Secondary | ICD-10-CM | POA: Diagnosis present

## 2015-07-19 DIAGNOSIS — Z800 Family history of malignant neoplasm of digestive organs: Secondary | ICD-10-CM | POA: Diagnosis not present

## 2015-07-19 DIAGNOSIS — R0600 Dyspnea, unspecified: Secondary | ICD-10-CM

## 2015-07-19 DIAGNOSIS — Z955 Presence of coronary angioplasty implant and graft: Secondary | ICD-10-CM

## 2015-07-19 DIAGNOSIS — E039 Hypothyroidism, unspecified: Secondary | ICD-10-CM | POA: Diagnosis present

## 2015-07-19 DIAGNOSIS — N179 Acute kidney failure, unspecified: Secondary | ICD-10-CM | POA: Diagnosis present

## 2015-07-19 DIAGNOSIS — Z96651 Presence of right artificial knee joint: Secondary | ICD-10-CM | POA: Diagnosis present

## 2015-07-19 DIAGNOSIS — F329 Major depressive disorder, single episode, unspecified: Secondary | ICD-10-CM | POA: Diagnosis present

## 2015-07-19 DIAGNOSIS — I130 Hypertensive heart and chronic kidney disease with heart failure and stage 1 through stage 4 chronic kidney disease, or unspecified chronic kidney disease: Secondary | ICD-10-CM | POA: Diagnosis present

## 2015-07-19 DIAGNOSIS — I208 Other forms of angina pectoris: Secondary | ICD-10-CM

## 2015-07-19 DIAGNOSIS — Z79899 Other long term (current) drug therapy: Secondary | ICD-10-CM | POA: Diagnosis not present

## 2015-07-19 DIAGNOSIS — I25118 Atherosclerotic heart disease of native coronary artery with other forms of angina pectoris: Secondary | ICD-10-CM | POA: Diagnosis present

## 2015-07-19 DIAGNOSIS — Z833 Family history of diabetes mellitus: Secondary | ICD-10-CM | POA: Diagnosis not present

## 2015-07-19 DIAGNOSIS — I5021 Acute systolic (congestive) heart failure: Secondary | ICD-10-CM | POA: Diagnosis not present

## 2015-07-19 DIAGNOSIS — J45909 Unspecified asthma, uncomplicated: Secondary | ICD-10-CM | POA: Diagnosis present

## 2015-07-19 DIAGNOSIS — M069 Rheumatoid arthritis, unspecified: Secondary | ICD-10-CM | POA: Diagnosis present

## 2015-07-19 DIAGNOSIS — I5033 Acute on chronic diastolic (congestive) heart failure: Secondary | ICD-10-CM

## 2015-07-19 DIAGNOSIS — I119 Hypertensive heart disease without heart failure: Secondary | ICD-10-CM | POA: Diagnosis present

## 2015-07-19 DIAGNOSIS — K219 Gastro-esophageal reflux disease without esophagitis: Secondary | ICD-10-CM | POA: Diagnosis present

## 2015-07-19 DIAGNOSIS — Z9981 Dependence on supplemental oxygen: Secondary | ICD-10-CM

## 2015-07-19 DIAGNOSIS — I509 Heart failure, unspecified: Secondary | ICD-10-CM

## 2015-07-19 DIAGNOSIS — Z6841 Body Mass Index (BMI) 40.0 and over, adult: Secondary | ICD-10-CM | POA: Diagnosis not present

## 2015-07-19 DIAGNOSIS — Z7901 Long term (current) use of anticoagulants: Secondary | ICD-10-CM

## 2015-07-19 DIAGNOSIS — F410 Panic disorder [episodic paroxysmal anxiety] without agoraphobia: Secondary | ICD-10-CM | POA: Diagnosis present

## 2015-07-19 DIAGNOSIS — J9621 Acute and chronic respiratory failure with hypoxia: Secondary | ICD-10-CM | POA: Diagnosis present

## 2015-07-19 DIAGNOSIS — I5041 Acute combined systolic (congestive) and diastolic (congestive) heart failure: Secondary | ICD-10-CM

## 2015-07-19 DIAGNOSIS — I110 Hypertensive heart disease with heart failure: Secondary | ICD-10-CM | POA: Diagnosis not present

## 2015-07-19 DIAGNOSIS — D638 Anemia in other chronic diseases classified elsewhere: Secondary | ICD-10-CM | POA: Diagnosis present

## 2015-07-19 DIAGNOSIS — H409 Unspecified glaucoma: Secondary | ICD-10-CM | POA: Diagnosis present

## 2015-07-19 DIAGNOSIS — E785 Hyperlipidemia, unspecified: Secondary | ICD-10-CM | POA: Diagnosis present

## 2015-07-19 DIAGNOSIS — I2510 Atherosclerotic heart disease of native coronary artery without angina pectoris: Secondary | ICD-10-CM | POA: Diagnosis not present

## 2015-07-19 DIAGNOSIS — E1122 Type 2 diabetes mellitus with diabetic chronic kidney disease: Secondary | ICD-10-CM | POA: Diagnosis present

## 2015-07-19 DIAGNOSIS — I2089 Other forms of angina pectoris: Secondary | ICD-10-CM

## 2015-07-19 DIAGNOSIS — IMO0002 Reserved for concepts with insufficient information to code with codable children: Secondary | ICD-10-CM | POA: Diagnosis present

## 2015-07-19 HISTORY — DX: Fracture of one rib, right side, initial encounter for closed fracture: S22.31XA

## 2015-07-19 HISTORY — DX: Chronic diastolic (congestive) heart failure: I50.32

## 2015-07-19 HISTORY — DX: Hypertensive heart disease without heart failure: I11.9

## 2015-07-19 LAB — CBC
HEMATOCRIT: 30.3 % — AB (ref 35.0–47.0)
Hemoglobin: 9.9 g/dL — ABNORMAL LOW (ref 12.0–16.0)
MCH: 27.5 pg (ref 26.0–34.0)
MCHC: 32.8 g/dL (ref 32.0–36.0)
MCV: 83.9 fL (ref 80.0–100.0)
Platelets: 321 10*3/uL (ref 150–440)
RBC: 3.61 MIL/uL — AB (ref 3.80–5.20)
RDW: 17.7 % — AB (ref 11.5–14.5)
WBC: 7 10*3/uL (ref 3.6–11.0)

## 2015-07-19 LAB — BASIC METABOLIC PANEL
Anion gap: 8 (ref 5–15)
BUN: 20 mg/dL (ref 6–20)
CHLORIDE: 108 mmol/L (ref 101–111)
CO2: 24 mmol/L (ref 22–32)
CREATININE: 1.1 mg/dL — AB (ref 0.44–1.00)
Calcium: 9.1 mg/dL (ref 8.9–10.3)
GFR calc Af Amer: 54 mL/min — ABNORMAL LOW (ref >60)
GFR calc non Af Amer: 47 mL/min — ABNORMAL LOW (ref >60)
Glucose, Bld: 220 mg/dL — ABNORMAL HIGH (ref 65–99)
POTASSIUM: 3.5 mmol/L (ref 3.5–5.1)
SODIUM: 140 mmol/L (ref 135–145)

## 2015-07-19 LAB — PROTIME-INR
INR: 1.23
Prothrombin Time: 15.7 seconds — ABNORMAL HIGH (ref 11.4–15.0)

## 2015-07-19 LAB — BRAIN NATRIURETIC PEPTIDE: B NATRIURETIC PEPTIDE 5: 316 pg/mL — AB (ref 0.0–100.0)

## 2015-07-19 LAB — GLUCOSE, CAPILLARY
GLUCOSE-CAPILLARY: 48 mg/dL — AB (ref 65–99)
Glucose-Capillary: 96 mg/dL (ref 65–99)
Glucose-Capillary: 282 mg/dL — ABNORMAL HIGH (ref 65–99)

## 2015-07-19 LAB — TROPONIN I: TROPONIN I: 0.03 ng/mL (ref <0.031)

## 2015-07-19 MED ORDER — ONDANSETRON HCL 4 MG PO TABS: 4.0000 mg | ORAL_TABLET | Freq: Four times a day (QID) | ORAL | Status: DC | PRN

## 2015-07-19 MED ORDER — INSULIN ASPART 100 UNIT/ML ~~LOC~~ SOLN
0.0000 [IU] | Freq: Three times a day (TID) | SUBCUTANEOUS | Status: DC
  Administered 2015-07-20 – 2015-07-21 (×3): 2 [IU] via SUBCUTANEOUS
  Administered 2015-07-21: 1 [IU] via SUBCUTANEOUS
  Administered 2015-07-22: 2 [IU] via SUBCUTANEOUS
  Administered 2015-07-22: 7 [IU] via SUBCUTANEOUS
  Administered 2015-07-23: 2 [IU] via SUBCUTANEOUS
  Administered 2015-07-23: 8 [IU] via SUBCUTANEOUS
  Administered 2015-07-24: 3 [IU] via SUBCUTANEOUS
  Administered 2015-07-24: 2 [IU] via SUBCUTANEOUS
  Administered 2015-07-25: 1 [IU] via SUBCUTANEOUS
  Administered 2015-07-25 – 2015-07-26 (×4): 3 [IU] via SUBCUTANEOUS
  Filled 2015-07-19 (×4): qty 2
  Filled 2015-07-19 (×2): qty 3
  Filled 2015-07-19 (×2): qty 2
  Filled 2015-07-19: qty 3
  Filled 2015-07-19 (×2): qty 1
  Filled 2015-07-19: qty 5
  Filled 2015-07-19: qty 3
  Filled 2015-07-19: qty 7
  Filled 2015-07-19: qty 3

## 2015-07-19 MED ORDER — ENOXAPARIN SODIUM 40 MG/0.4ML ~~LOC~~ SOLN: 40.0000 mg | SUBCUTANEOUS | Status: DC

## 2015-07-19 MED ORDER — LEVOTHYROXINE SODIUM 150 MCG PO TABS
150.0000 ug | ORAL_TABLET | Freq: Every day | ORAL | Status: DC
  Administered 2015-07-20 – 2015-07-27 (×8): 150 ug via ORAL
  Filled 2015-07-19 (×8): qty 1

## 2015-07-19 MED ORDER — NITROGLYCERIN 0.4 MG SL SUBL: 0.4000 mg | SUBLINGUAL_TABLET | SUBLINGUAL | Status: DC | PRN

## 2015-07-19 MED ORDER — ACETAMINOPHEN 650 MG RE SUPP: 650.0000 mg | Freq: Four times a day (QID) | RECTAL | Status: DC | PRN

## 2015-07-19 MED ORDER — ACETAMINOPHEN 325 MG PO TABS
650.0000 mg | ORAL_TABLET | Freq: Four times a day (QID) | ORAL | Status: DC | PRN
  Administered 2015-07-19: 650 mg via ORAL
  Filled 2015-07-19: qty 2

## 2015-07-19 MED ORDER — FUROSEMIDE 10 MG/ML IJ SOLN
20.0000 mg | Freq: Two times a day (BID) | INTRAMUSCULAR | Status: DC
  Administered 2015-07-20: 20 mg via INTRAVENOUS
  Filled 2015-07-19: qty 2

## 2015-07-19 MED ORDER — ALBUTEROL SULFATE (2.5 MG/3ML) 0.083% IN NEBU
2.5000 mg | INHALATION_SOLUTION | Freq: Four times a day (QID) | RESPIRATORY_TRACT | Status: DC | PRN
  Administered 2015-07-20 – 2015-07-26 (×2): 2.5 mg via RESPIRATORY_TRACT
  Filled 2015-07-19 (×2): qty 3

## 2015-07-19 MED ORDER — LISINOPRIL 10 MG PO TABS
10.0000 mg | ORAL_TABLET | Freq: Every day | ORAL | Status: DC
  Administered 2015-07-19: 10 mg via ORAL
  Filled 2015-07-19 (×2): qty 1

## 2015-07-19 MED ORDER — SODIUM CHLORIDE 0.9 % IV SOLN
250.0000 mL | INTRAVENOUS | Status: DC | PRN
  Administered 2015-07-24: 1000 mL via INTRAVENOUS
  Filled 2015-07-19: qty 250

## 2015-07-19 MED ORDER — SODIUM CHLORIDE 0.9% FLUSH: 3.0000 mL | INTRAVENOUS | Status: DC | PRN

## 2015-07-19 MED ORDER — POTASSIUM CHLORIDE CRYS ER 20 MEQ PO TBCR
20.0000 meq | EXTENDED_RELEASE_TABLET | Freq: Every day | ORAL | Status: DC
  Administered 2015-07-19 – 2015-07-21 (×3): 20 meq via ORAL
  Filled 2015-07-19 (×3): qty 1

## 2015-07-19 MED ORDER — AMIODARONE HCL 200 MG PO TABS
200.0000 mg | ORAL_TABLET | Freq: Two times a day (BID) | ORAL | Status: DC
  Administered 2015-07-19 – 2015-07-21 (×4): 200 mg via ORAL
  Filled 2015-07-19 (×5): qty 1

## 2015-07-19 MED ORDER — MECLIZINE HCL 25 MG PO TABS: 25.0000 mg | ORAL_TABLET | Freq: Three times a day (TID) | ORAL | Status: DC | PRN

## 2015-07-19 MED ORDER — APIXABAN 5 MG PO TABS
5.0000 mg | ORAL_TABLET | Freq: Two times a day (BID) | ORAL | Status: DC
  Administered 2015-07-19 – 2015-07-27 (×16): 5 mg via ORAL
  Filled 2015-07-19 (×16): qty 1

## 2015-07-19 MED ORDER — ATORVASTATIN CALCIUM 20 MG PO TABS
20.0000 mg | ORAL_TABLET | Freq: Every day | ORAL | Status: DC
  Administered 2015-07-19 – 2015-07-27 (×9): 20 mg via ORAL
  Filled 2015-07-19 (×8): qty 1

## 2015-07-19 MED ORDER — SENNOSIDES-DOCUSATE SODIUM 8.6-50 MG PO TABS
1.0000 | ORAL_TABLET | Freq: Every evening | ORAL | Status: DC | PRN
  Administered 2015-07-25: 1 via ORAL
  Filled 2015-07-19: qty 1

## 2015-07-19 MED ORDER — METOPROLOL TARTRATE 25 MG PO TABS
25.0000 mg | ORAL_TABLET | Freq: Two times a day (BID) | ORAL | Status: DC
  Administered 2015-07-19 – 2015-07-21 (×3): 25 mg via ORAL
  Filled 2015-07-19 (×3): qty 1

## 2015-07-19 MED ORDER — FERROUS SULFATE 325 (65 FE) MG PO TABS
325.0000 mg | ORAL_TABLET | Freq: Every day | ORAL | Status: DC
  Administered 2015-07-20 – 2015-07-27 (×8): 325 mg via ORAL
  Filled 2015-07-19 (×8): qty 1

## 2015-07-19 MED ORDER — FUROSEMIDE 40 MG PO TABS
40.0000 mg | ORAL_TABLET | Freq: Every day | ORAL | Status: DC
  Administered 2015-07-19: 40 mg via ORAL
  Filled 2015-07-19: qty 1

## 2015-07-19 MED ORDER — SERTRALINE HCL 50 MG PO TABS
50.0000 mg | ORAL_TABLET | Freq: Every day | ORAL | Status: DC
  Administered 2015-07-19 – 2015-07-26 (×8): 50 mg via ORAL
  Filled 2015-07-19 (×8): qty 1

## 2015-07-19 MED ORDER — GABAPENTIN 300 MG PO CAPS
300.0000 mg | ORAL_CAPSULE | Freq: Three times a day (TID) | ORAL | Status: DC
  Administered 2015-07-19 – 2015-07-27 (×23): 300 mg via ORAL
  Filled 2015-07-19 (×25): qty 1

## 2015-07-19 MED ORDER — AMLODIPINE BESYLATE 10 MG PO TABS
10.0000 mg | ORAL_TABLET | Freq: Every day | ORAL | Status: DC
  Administered 2015-07-22 – 2015-07-27 (×6): 10 mg via ORAL
  Filled 2015-07-19 (×6): qty 1

## 2015-07-19 MED ORDER — SODIUM CHLORIDE 0.9% FLUSH
3.0000 mL | Freq: Two times a day (BID) | INTRAVENOUS | Status: DC
  Administered 2015-07-19 – 2015-07-23 (×9): 3 mL via INTRAVENOUS

## 2015-07-19 MED ORDER — FLUTICASONE PROPIONATE 50 MCG/ACT NA SUSP
2.0000 | Freq: Every day | NASAL | Status: DC | PRN
  Filled 2015-07-19: qty 16

## 2015-07-19 MED ORDER — LATANOPROST 0.005 % OP SOLN
1.0000 [drp] | Freq: Every day | OPHTHALMIC | Status: DC
  Filled 2015-07-19: qty 2.5

## 2015-07-19 MED ORDER — METFORMIN HCL 500 MG PO TABS: 500.0000 mg | ORAL_TABLET | Freq: Two times a day (BID) | ORAL | Status: DC

## 2015-07-19 MED ORDER — PANTOPRAZOLE SODIUM 40 MG PO TBEC
40.0000 mg | DELAYED_RELEASE_TABLET | Freq: Every day | ORAL | Status: DC
  Administered 2015-07-19 – 2015-07-23 (×5): 40 mg via ORAL
  Filled 2015-07-19 (×5): qty 1

## 2015-07-19 MED ORDER — ALBUTEROL SULFATE HFA 108 (90 BASE) MCG/ACT IN AERS: 2.0000 | INHALATION_SPRAY | Freq: Four times a day (QID) | RESPIRATORY_TRACT | Status: DC | PRN

## 2015-07-19 MED ORDER — METFORMIN HCL 500 MG PO TABS
500.0000 mg | ORAL_TABLET | Freq: Two times a day (BID) | ORAL | Status: DC
  Administered 2015-07-20 (×2): 500 mg via ORAL
  Filled 2015-07-19 (×3): qty 1

## 2015-07-19 MED ORDER — ONDANSETRON HCL 4 MG/2ML IJ SOLN
4.0000 mg | Freq: Four times a day (QID) | INTRAMUSCULAR | Status: DC | PRN
  Administered 2015-07-21: 4 mg via INTRAVENOUS
  Filled 2015-07-19: qty 2

## 2015-07-19 MED ORDER — INSULIN GLARGINE 100 UNIT/ML ~~LOC~~ SOLN
50.0000 [IU] | Freq: Every day | SUBCUTANEOUS | Status: DC
  Administered 2015-07-20: 50 [IU] via SUBCUTANEOUS
  Filled 2015-07-19: qty 0.5

## 2015-07-19 MED ORDER — TRAMADOL HCL 50 MG PO TABS
100.0000 mg | ORAL_TABLET | Freq: Three times a day (TID) | ORAL | Status: DC | PRN
  Administered 2015-07-19 – 2015-07-20 (×3): 100 mg via ORAL
  Filled 2015-07-19 (×3): qty 2

## 2015-07-19 MED ORDER — AMLODIPINE BESYLATE 10 MG PO TABS
10.0000 mg | ORAL_TABLET | Freq: Every day | ORAL | Status: DC
  Administered 2015-07-19: 10 mg via ORAL
  Filled 2015-07-19: qty 2

## 2015-07-19 NOTE — ED Notes (Signed)
Diet order placed for patient.

## 2015-07-19 NOTE — ED Notes (Addendum)
MD notified.  2 cups orange juice given to patient.

## 2015-07-19 NOTE — H&P (Signed)
[facility] [place] [facility]   PATIENT NAME: [name] [name]    MR#:  [id number]  DATE OF BIRTH:  1935/01/22  DATE OF ADMISSION:  07/19/2015  PRIMARY CARE PHYSICIAN: [name], MD   REQUESTING/REFERRING PHYSICIAN: Dr. Darnelle Catalan  CHIEF COMPLAINT:   Shortness of breath, leg swelling, weight gain 15 pounds HISTORY OF PRESENT ILLNESS:  [name] [name]  is a 81 y.o. female with a known history of chronic diastolic congestive heart failure, CAD status post stent placement 2014, hypothyroidism, rheumatoid arthritis, type 2 diabetes comes to the emergency room with complaints of increasing shortness of breath and leg swelling and weight gain of 15 pounds. She was seen by primary care physician yesterday and was asked to take extra doses of Lasix. Patient was also ask him to she decided to come today since she didn't have a ride yesterday. Patient was found to be in acute on chronic congestive heart failure diastolic (for further evaluation and management management. She did receive IV Lasix in the emergency room.  PAST MEDICAL HISTORY:   Past Medical History  Diagnosis Date  . DM (diabetes mellitus) (HCC)   . PAF (paroxysmal atrial fibrillation) (HCC)     a. s/p dccv 03/2014;  b. amio/eliquis;  c. pt d/c'd amio 05/2014;  d. 06/2013 bb/ccb d/c 2/2 symptomatic bradycardia.  . Dyslipidemia   . CAD (coronary artery disease)     a. s/p prior PCI RCA with ISR req CBA;  b. 04/2013 Neg MV;  c. 05/2013 Cath: LM 20, LAD 30p, LCX 20p, OM1 90 small, RCA 81m, PDA 40-->Med Rx.  . Osteoarthritis   . Hypothyroidism     hx  . Rheumatoid arthritis(714.0)   . Obesity   . GERD (gastroesophageal reflux disease)   . Depression   . Shoulder fracture, left     Dr. Martha Clan  . Cerebral infarction (HCC)   . CKD (chronic kidney disease), stage III   . Asthma   . CHF (congestive heart failure) (HCC)   . Hypertension   . Renal insufficiency     PAST SURGICAL HISTOIRY:    Past Surgical History  Procedure Laterality Date  . Rca stent placement  2000  . Restenosis with ptca placement  2005  . Cataract surgery    . Partial hysterectomy    . Replacement total knee  2013    right  . Shoulder surgery  02/04/13  . Cardiac catheterization  06/11/2013  . Coronary angioplasty      SOCIAL HISTORY:   Social History  Substance Use Topics  . Smoking status: Never Smoker   . Smokeless tobacco: Never Used     Comment: former passive smoker  . Alcohol Use: No    FAMILY HISTORY:   Family History  Problem Relation Age of Onset  . Esophageal cancer Mother   . Diabetes Mother   . Cancer Mother     esophageal     DRUG ALLERGIES:   Allergies  Allergen Reactions  . Oxycodone Nausea And Vomiting    REVIEW OF SYSTEMS:  Review of Systems  Constitutional: Negative for fever, chills and weight loss.  HENT: Negative for ear discharge, ear pain and nosebleeds.   Eyes: Negative for blurred vision, pain and discharge.  Respiratory: Positive for shortness of breath. Negative for sputum production, wheezing and stridor.   Cardiovascular: Positive for orthopnea, leg swelling and PND. Negative for chest pain and palpitations.  Gastrointestinal: Negative for nausea, vomiting, abdominal pain and diarrhea.  Genitourinary:  Negative for urgency and frequency.  Musculoskeletal: Negative for back pain and joint pain.  Neurological: Positive for weakness. Negative for sensory change, speech change and focal weakness.  Psychiatric/Behavioral: Negative for depression and hallucinations. The patient is not nervous/anxious.   All other systems reviewed and are negative.    MEDICATIONS AT HOME:   Prior to Admission medications   Medication Sig Start Date End Date Taking? Authorizing Provider  albuterol (PROVENTIL HFA;VENTOLIN HFA) 108 (90 BASE) MCG/ACT inhaler Inhale 2 puffs into the lungs every 6 (six) hours as needed for wheezing or shortness of breath. 06/28/13  Yes  Joaquim Nam, MD  amiodarone (PACERONE) 200 MG tablet Take 1 tablet (200 mg total) by mouth 2 (two) times daily. 05/02/15  Yes Antonieta Iba, MD  amLODipine (NORVASC) 10 MG tablet Take 1 tablet (10 mg total) by mouth daily. 02/17/15  Yes Shelia Media, MD  apixaban (ELIQUIS) 5 MG TABS tablet Take 5 mg by mouth 2 (two) times daily.   Yes Historical Provider, MD  atorvastatin (LIPITOR) 20 MG tablet Take 1 tablet (20 mg total) by mouth daily. 04/06/15  Yes Shelia Media, MD  ferrous sulfate 325 (65 FE) MG tablet Take 325 mg by mouth daily with breakfast.   Yes Historical Provider, MD  fluticasone (FLONASE) 50 MCG/ACT nasal spray Place 2 sprays into both nostrils daily as needed for rhinitis.   Yes Historical Provider, MD  furosemide (LASIX) 40 MG tablet Take 1 tablet (40 mg total) by mouth daily. 07/13/15  Yes Shelia Media, MD  gabapentin (NEURONTIN) 300 MG capsule Take 1 capsule (300 mg total) by mouth 3 (three) times daily. 09/13/14  Yes Shelia Media, MD  insulin aspart (NOVOLOG) 100 UNIT/ML injection Inject 2-10 Units into the skin 3 (three) times daily with meals as needed for high blood sugar. Pt uses per sliding scale:  140-199:  2 units  200-250:  4 units  251-299:  6 units  300-349:  8 units  Greater than 350: 10 units   Yes Historical Provider, MD  insulin glargine (LANTUS) 100 UNIT/ML injection Inject 50 Units into the skin daily.   Yes Historical Provider, MD  latanoprost (XALATAN) 0.005 % ophthalmic solution Place 1 drop into both eyes at bedtime.    Yes Historical Provider, MD  levothyroxine (SYNTHROID, LEVOTHROID) 150 MCG tablet Take 150 mcg by mouth daily before breakfast.   Yes Historical Provider, MD  lisinopril (PRINIVIL,ZESTRIL) 10 MG tablet Take 1 tablet (10 mg total) by mouth daily. 07/12/15  Yes Shelia Media, MD  meclizine (ANTIVERT) 25 MG tablet Take 25 mg by mouth 3 (three) times daily as needed for dizziness.   Yes Historical Provider, MD  metFORMIN  (GLUCOPHAGE) 500 MG tablet Take 1 tablet (500 mg total) by mouth 2 (two) times daily. 08/15/14  Yes Shelia Media, MD  metoprolol tartrate (LOPRESSOR) 25 MG tablet Take 1 tablet (25 mg total) by mouth 2 (two) times daily. 05/02/15  Yes Antonieta Iba, MD  nitroGLYCERIN (NITROSTAT) 0.4 MG SL tablet Place 0.4 mg under the tongue every 5 (five) minutes as needed for chest pain.   Yes Historical Provider, MD  omeprazole (PRILOSEC) 20 MG capsule Take 1 capsule (20 mg total) by mouth 2 (two) times daily. 01/12/15 05/17/16 Yes Shelia Media, MD  ondansetron (ZOFRAN-ODT) 4 MG disintegrating tablet Take 1 tablet (4 mg total) by mouth every 8 (eight) hours as needed for nausea or vomiting. 07/12/15  Yes Shelia Media,  MD  potassium chloride SA (K-DUR,KLOR-CON) 20 MEQ tablet Take 1 tablet (20 mEq total) by mouth daily. 06/26/14  Yes Ok Anis, NP  sertraline (ZOLOFT) 50 MG tablet Take 1 tablet (50 mg total) by mouth daily. 05/27/14  Yes Shelia Media, MD  traMADol (ULTRAM) 50 MG tablet Take 100 mg by mouth every 8 (eight) hours as needed for moderate pain.   Yes Historical Provider, MD  gentamicin ointment (GARAMYCIN) 0.1 % Apply 1 application topically 3 (three) times daily. Patient not taking: Reported on 07/19/2015 04/06/15   Shelia Media, MD  HYDROcodone-acetaminophen (NORCO/VICODIN) 5-325 MG tablet Take 1-2 tablets by mouth every 6 (six) hours as needed for moderate pain. Patient not taking: Reported on 07/19/2015 05/08/15   Delorise Royals Cuthriell, PA-C      VITAL SIGNS:  Blood pressure 151/73, pulse 59, temperature 97.8 F (36.6 C), temperature source Oral, resp. rate 16, height 5\' 2"  (1.575 m), weight 102.059 kg (225 lb), SpO2 94 %.  PHYSICAL EXAMINATION:  GENERAL:  81 y.o.-year-old patient lying in the bed with no acute distress. Morbidly obese. EYES: Pupils equal, round, reactive to light and accommodation. No scleral icterus. Extraocular muscles intact.  HEENT: Head  atraumatic, normocephalic. Oropharynx and nasopharynx clear.  NECK:  Supple, no jugular venous distention. No thyroid enlargement, no tenderness.  LUNGS coarse breath sounds bilaterally, no wheezing, positive rales, no rhonchi or crepitation. No use of accessory muscles of respiration.  CARDIOVASCULAR: S1, S2 normal. No murmurs, rubs, or gallops.  ABDOMEN: Soft, nontender, nondistended. Bowel sounds present. No organomegaly or mass.  EXTREMITIES: 3+ pedal edema, cyanosis, or clubbing.  NEUROLOGIC: Cranial nerves II through XII are intact. Muscle strength 5/5 in all extremities. Sensation intact. Gait not checked.  PSYCHIATRIC:  patient is alert and oriented x 3.  SKIN: No obvious rash, lesion, or ulcer.   LABORATORY PANEL:   CBC  Recent Labs Lab 07/19/15 1316  WBC 7.0  HGB 9.9*  HCT 30.3*  PLT 321   ------------------------------------------------------------------------------------------------------------------  Chemistries   Recent Labs Lab 07/19/15 1316  NA 140  K 3.5  CL 108  CO2 24  GLUCOSE 220*  BUN 20  CREATININE 1.10*  CALCIUM 9.1   ------------------------------------------------------------------------------------------------------------------  Cardiac Enzymes  Recent Labs Lab 07/19/15 1316  TROPONINI 0.03   ------------------------------------------------------------------------------------------------------------------  RADIOLOGY:  Dg Chest 2 View  07/19/2015  CLINICAL DATA:  Chest pain under the left breast since 07/14/2015. Initial encounter. EXAM: CHEST  2 VIEW COMPARISON:  PA and lateral chest 06/15/2015. Plain film of the chest and right ribs 05/07/2015. FINDINGS: Lung volumes are somewhat low with mild basilar atelectasis. There is cardiomegaly without edema. No pneumothorax or pleural effusion. IMPRESSION: Cardiomegaly without acute disease. Electronically Signed   By: 05/09/2015 M.D.   On: 07/19/2015 13:47    EKG:   A. fib with slow  ventricular rate IMPRESSION AND PLAN:   [name] [name]  is a 81 y.o. female with a known history of chronic diastolic congestive heart failure, CAD status post stent placement 2014, hypothyroidism, rheumatoid arthritis, type 2 diabetes comes to the emergency room with complaints of increasing shortness of breath and leg swelling and weight gain of 15 pounds. She was seen by primary care physician yesterday and was asked to take extra doses of Lasix  1. Acute on chronic diastolic congestive heart failure -Admit to telemetry -Patient presented with shortness of breath and leg swelling and weight gain of 15 pounds. -Start patient on IV Lasix 20 twice a day  monitor I's and O's daily weights and creatinine -Cardiology consultation with Dr.:  2. A. fib with slow ventricular rate -Continue amiodarone, metoprolol -Patient is on chronic anticoagulation with eliquis  3. Type 2 diabetes continue Lantus insulin and metformin and sliding scale 4. GERD continue PPI  5. Anemia of chronic disease continue ferrous sulfate  6. Hyperlipidemia on statins  7. DVT prophylaxis patient already on oral anticoagulation  All the records are reviewed and case discussed with ED provider. Management plans discussed with the patient, family and they are in agreement.  CODE STATUS: Full  TOTAL TIME TAKING CARE OF THIS PATIENT: 50 minutes.    [name] [name] M.D on 07/19/2015 at 3:48 PM  Between 7am to 6pm - Pager - [phone]  After 6pm go to [email or web address] - password [email or web address] [facility]  [facility]  Office  [phone]  CC: Primary care physician; [name], MD

## 2015-07-19 NOTE — ED Provider Notes (Signed)
[facility] Emergency Department Provider Note  ____________________________________________  Time seen: Approximately 2:57 PM  I have reviewed the triage vital signs and the nursing notes.   HISTORY  Chief Complaint Chest Pain and Leg Swelling    HPI [name] [name] is a 81 y.o. female she reports she's been getting increasingly short of breath cannot hardly walk around at home cannot bend over and pick anything up because she gets too short of breath. She went to see her doctor yesterday her doctor told her she had new onset congestive heart failure as I understand and wanted her to come to the hospital. She would not do the hospital yesterday but today her friend talked her into coming to the hospital. Patient reports she's gained at least 7 pounds in the last couple days and 15 pounds in the last 2 weeks. In spite of the fact that I understand that she has new onset of congestive heart failure patient has been taking Lasix for years. She is taking her Lasix as instructed and has continued to gain weight and become increasingly short of breath. She says she cannot lie down easily without becoming short breath too. I am concerned that the patient may be having an anginal equivalent with her increasing shortness of breath with exertion. And she does appear to be having congestive heart failure as well. Although she is not hypoxic at present.   Past Medical History  Diagnosis Date  . DM (diabetes mellitus) (HCC)   . PAF (paroxysmal atrial fibrillation) (HCC)     a. s/p dccv 03/2014;  b. amio/eliquis;  c. pt d/c'd amio 05/2014;  d. 06/2013 bb/ccb d/c 2/2 symptomatic bradycardia.  . Dyslipidemia   . CAD (coronary artery disease)     a. s/p prior PCI RCA with ISR req CBA;  b. 04/2013 Neg MV;  c. 05/2013 Cath: LM 20, LAD 30p, LCX 20p, OM1 90 small, RCA 71m, PDA 40-->Med Rx.  . Osteoarthritis   . Hypothyroidism     hx  . Rheumatoid arthritis(714.0)   . Obesity   . GERD  (gastroesophageal reflux disease)   . Depression   . Shoulder fracture, left     Dr. Martha Clan  . Cerebral infarction (HCC)   . CKD (chronic kidney disease), stage III   . Asthma   . CHF (congestive heart failure) (HCC)   . Hypertension   . Renal insufficiency     Patient Active Problem List   Diagnosis Date Noted  . CHF (congestive heart failure) (HCC) 07/19/2015  . Dyspnea 07/18/2015  . Follicular acne 07/07/2015  . Acute diastolic heart failure (HCC) 06/16/2015  . Routine history and physical examination of adult 01/13/2015  . Dermatitis 06/27/2014  . CKD (chronic kidney disease), stage III   . Bradycardia 06/24/2014  . Diabetes mellitus type 2, uncontrolled (HCC) 03/25/2014  . Depression 03/25/2014  . Medicare annual wellness visit, subsequent 01/10/2014  . Anxiety state, unspecified 01/10/2014  . Carotid stenosis 11/16/2013  . CVA (cerebral infarction) 10/21/2013  . Diabetic eye exam (HCC) 09/03/2013  . Screening for breast cancer 01/14/2013  . Poorly controlled type 2 diabetes mellitus with circulatory disorder (HCC) 02/04/2012  . Chronic UTI 06/12/2011  . GERD (gastroesophageal reflux disease) 06/12/2011  . Hyperlipidemia 06/12/2011  . Hypertension 05/17/2011  . Hypothyroidism 03/07/2011  . Chronic back pain 02/21/2011  . Morbid obesity (HCC) 08/17/2008  . CAD (coronary artery disease) 08/17/2008  . Atrial fibrillation (HCC) 08/17/2008  . Osteoarthritis, knee 08/17/2008  Past Surgical History  Procedure Laterality Date  . Rca stent placement  2000  . Restenosis with ptca placement  2005  . Cataract surgery    . Partial hysterectomy    . Replacement total knee  2013    right  . Shoulder surgery  02/04/13  . Cardiac catheterization  06/11/2013  . Coronary angioplasty      Current Outpatient Rx  Name  Route  Sig  Dispense  Refill  . albuterol (PROVENTIL HFA;VENTOLIN HFA) 108 (90 BASE) MCG/ACT inhaler   Inhalation   Inhale 2 puffs into the lungs every 6  (six) hours as needed for wheezing or shortness of breath.   1 Inhaler   0   . amiodarone (PACERONE) 200 MG tablet   Oral   Take 1 tablet (200 mg total) by mouth 2 (two) times daily.   70 tablet   3   . amLODipine (NORVASC) 10 MG tablet   Oral   Take 1 tablet (10 mg total) by mouth daily.   30 tablet   5   . apixaban (ELIQUIS) 5 MG TABS tablet   Oral   Take 5 mg by mouth 2 (two) times daily.         Marland Kitchen atorvastatin (LIPITOR) 20 MG tablet   Oral   Take 1 tablet (20 mg total) by mouth daily.   90 tablet   3   . ferrous sulfate 325 (65 FE) MG tablet   Oral   Take 325 mg by mouth daily with breakfast.         . fluticasone (FLONASE) 50 MCG/ACT nasal spray   Each Nare   Place 2 sprays into both nostrils daily as needed for rhinitis.         . furosemide (LASIX) 40 MG tablet   Oral   Take 1 tablet (40 mg total) by mouth daily.   180 tablet   3   . gabapentin (NEURONTIN) 300 MG capsule   Oral   Take 1 capsule (300 mg total) by mouth 3 (three) times daily.   270 capsule   3   . insulin aspart (NOVOLOG) 100 UNIT/ML injection   Subcutaneous   Inject 2-10 Units into the skin 3 (three) times daily with meals as needed for high blood sugar. Pt uses per sliding scale:  140-199:  2 units  200-250:  4 units  251-299:  6 units  300-349:  8 units  Greater than 350: 10 units         . insulin glargine (LANTUS) 100 UNIT/ML injection   Subcutaneous   Inject 50 Units into the skin daily.         Marland Kitchen latanoprost (XALATAN) 0.005 % ophthalmic solution   Both Eyes   Place 1 drop into both eyes at bedtime.          Marland Kitchen levothyroxine (SYNTHROID, LEVOTHROID) 150 MCG tablet   Oral   Take 150 mcg by mouth daily before breakfast.         . lisinopril (PRINIVIL,ZESTRIL) 10 MG tablet   Oral   Take 1 tablet (10 mg total) by mouth daily.   90 tablet   3   . meclizine (ANTIVERT) 25 MG tablet   Oral   Take 25 mg by mouth 3 (three) times daily as needed for dizziness.          . metFORMIN (GLUCOPHAGE) 500 MG tablet   Oral   Take 1 tablet (500 mg total) by mouth 2 (  two) times daily.   180 tablet   3   . metoprolol tartrate (LOPRESSOR) 25 MG tablet   Oral   Take 1 tablet (25 mg total) by mouth 2 (two) times daily.   180 tablet   3   . nitroGLYCERIN (NITROSTAT) 0.4 MG SL tablet   Sublingual   Place 0.4 mg under the tongue every 5 (five) minutes as needed for chest pain.         Marland Kitchen omeprazole (PRILOSEC) 20 MG capsule   Oral   Take 1 capsule (20 mg total) by mouth 2 (two) times daily.   180 capsule   3   . ondansetron (ZOFRAN-ODT) 4 MG disintegrating tablet   Oral   Take 1 tablet (4 mg total) by mouth every 8 (eight) hours as needed for nausea or vomiting.   20 tablet   0   . potassium chloride SA (K-DUR,KLOR-CON) 20 MEQ tablet   Oral   Take 1 tablet (20 mEq total) by mouth daily.   180 tablet   3   . sertraline (ZOLOFT) 50 MG tablet   Oral   Take 1 tablet (50 mg total) by mouth daily.   90 tablet   0   . traMADol (ULTRAM) 50 MG tablet   Oral   Take 100 mg by mouth every 8 (eight) hours as needed for moderate pain.         Marland Kitchen gentamicin ointment (GARAMYCIN) 0.1 %   Topical   Apply 1 application topically 3 (three) times daily. Patient not taking: Reported on 07/19/2015   15 g   0   . HYDROcodone-acetaminophen (NORCO/VICODIN) 5-325 MG tablet   Oral   Take 1-2 tablets by mouth every 6 (six) hours as needed for moderate pain. Patient not taking: Reported on 07/19/2015   30 tablet   0     Allergies Oxycodone  Family History  Problem Relation Age of Onset  . Esophageal cancer Mother   . Diabetes Mother   . Cancer Mother     esophageal     Social History Social History  Substance Use Topics  . Smoking status: Never Smoker   . Smokeless tobacco: Never Used     Comment: former passive smoker  . Alcohol Use: No    Review of Systems Constitutional: No fever/chills Eyes: No visual changes. ENT: No sore  throat. Cardiovascular: Denies chest pain. Respiratory see history of present illness Gastrointestinal: No abdominal pain.  No nausea, no vomiting.  No diarrhea.  No constipation. Genitourinary: Negative for dysuria. Musculoskeletal: Negative for back pain. Skin: Negative for rash. Neurological: Negative for headaches, focal weakness or numbness.  10-point ROS otherwise negative.  ____________________________________________   PHYSICAL EXAM:  VITAL SIGNS: ED Triage Vitals  Enc Vitals Group     BP 07/19/15 1321 140/75 mmHg     Pulse Rate 07/19/15 1321 60     Resp 07/19/15 1321 22     Temp 07/19/15 1321 97.8 F (36.6 C)     Temp Source 07/19/15 1321 Oral     SpO2 07/19/15 1321 95 %     Weight 07/19/15 1321 225 lb (102.059 kg)     Height 07/19/15 1321 5\' 2"  (1.575 m)     Head Cir --      Peak Flow --      Pain Score 07/19/15 1322 4     Pain Loc --      Pain Edu? --      Excl. in GC? --  Constitutional: Alert and oriented. Well appearing and in no acute distress. Eyes: Conjunctivae are normal. PERRL. EOMI. Head: Atraumatic. Nose: No congestion/rhinnorhea. Mouth/Throat: Mucous membranes are moist.  Oropharynx non-erythematous. Neck: No stridor. Cardiovascular: Normal rate, regular rhythm. Grossly normal heart sounds.  Good peripheral circulation. Respiratory: Normal respiratory effort.  No retractions. Lungs scattered crackles Gastrointestinal: Soft and nontender. No distention. No abdominal bruits. No CVA tenderness. Musculoskeletal: No lower extremity tenderness nor edema.  No joint effusions. Neurologic:  Normal speech and language. No gross focal neurologic deficits are appreciated. No gait instability. Skin:  Skin is warm, dry and intact. No rash noted. Psychiatric: Mood and affect are normal. Speech and behavior are normal.  ____________________________________________   LABS (all labs ordered are listed, but only abnormal results are displayed)  Labs  Reviewed  BASIC METABOLIC PANEL - Abnormal; Notable for the following:    Glucose, Bld 220 (*)    Creatinine, Ser 1.10 (*)    GFR calc non Af Amer 47 (*)    GFR calc Af Amer 54 (*)    All other components within normal limits  CBC - Abnormal; Notable for the following:    RBC 3.61 (*)    Hemoglobin 9.9 (*)    HCT 30.3 (*)    RDW 17.7 (*)    All other components within normal limits  PROTIME-INR - Abnormal; Notable for the following:    Prothrombin Time 15.7 (*)    All other components within normal limits  BRAIN NATRIURETIC PEPTIDE - Abnormal; Notable for the following:    B Natriuretic Peptide 316.0 (*)    All other components within normal limits  GLUCOSE, CAPILLARY - Abnormal; Notable for the following:    Glucose-Capillary 48 (*)    All other components within normal limits  TROPONIN I  GLUCOSE, CAPILLARY  BASIC METABOLIC PANEL   ____________________________________________  EKG  EKG read and interpreted by me ____________________________________________  RADIOLOGY  Chest x-ray read by radiology as cardiomegaly no other problems ____________________________________________   PROCEDURES  Patient lives by herself cannot get around by herself at home at present.  ____________________________________________   INITIAL IMPRESSION / ASSESSMENT AND PLAN / ED COURSE  Pertinent labs & imaging results that were available during my care of the patient were reviewed by me and considered in my medical decision making (see chart for details).   ____________________________________________   FINAL CLINICAL IMPRESSION(S) / ED DIAGNOSES  Final diagnoses:  Dyspnea  Acute combined systolic and diastolic congestive heart failure (HCC)  Anginal equivalent (HCC)      Arnaldo Natal, MD 07/19/15 2038

## 2015-07-19 NOTE — ED Notes (Signed)
Called lab for BNP add-on!

## 2015-07-19 NOTE — ED Notes (Signed)
Patient transported to xray prior to being roomed, report given to receiving nurse and attending physician.

## 2015-07-19 NOTE — ED Notes (Signed)
Patient ate all her dinner.

## 2015-07-19 NOTE — ED Notes (Signed)
Cp and exertional sob since Friday.  Was seen at PCP [facility] office and ER visit was recommended and patient declined.  Patient presents with cp today that radiates to left arm and worsens with activity.  Patient in afib with slow response, mostly in high 40s low 50s.  Friend convinced patient to come today.    Arita Miss note: Symptoms most consistent with acute on chronic CHF. Recommended ER evaluation and IV furosemide, CXR, cardiac markers, given failure of improvement with Furosemide 40mg  po bid. Pt declines. Will increase Furosemide to 80mg  in the morning and 40mg  at lunch. BNP and troponin with labs. Recheck on Friday.

## 2015-07-20 ENCOUNTER — Encounter: Payer: Self-pay | Admitting: Nurse Practitioner

## 2015-07-20 ENCOUNTER — Inpatient Hospital Stay (HOSPITAL_COMMUNITY)
Admission: EM | Admit: 2015-07-20 | Discharge: 2015-07-20 | Disposition: A | Discharge status: Home / Self Care | Payer: Medicare Other | Source: Home / Self Care | Attending: Cardiovascular Disease | Admitting: Cardiovascular Disease

## 2015-07-20 ENCOUNTER — Ambulatory Visit: Payer: Medicare Other | Admitting: Cardiovascular Disease

## 2015-07-20 DIAGNOSIS — I4819 Other persistent atrial fibrillation: Secondary | ICD-10-CM

## 2015-07-20 DIAGNOSIS — I481 Persistent atrial fibrillation: Secondary | ICD-10-CM

## 2015-07-20 DIAGNOSIS — I5021 Acute systolic (congestive) heart failure: Secondary | ICD-10-CM

## 2015-07-20 DIAGNOSIS — I119 Hypertensive heart disease without heart failure: Secondary | ICD-10-CM | POA: Diagnosis present

## 2015-07-20 DIAGNOSIS — R0600 Dyspnea, unspecified: Secondary | ICD-10-CM

## 2015-07-20 DIAGNOSIS — I5033 Acute on chronic diastolic (congestive) heart failure: Secondary | ICD-10-CM

## 2015-07-20 DIAGNOSIS — I2510 Atherosclerotic heart disease of native coronary artery without angina pectoris: Secondary | ICD-10-CM

## 2015-07-20 DIAGNOSIS — I5032 Chronic diastolic (congestive) heart failure: Secondary | ICD-10-CM | POA: Diagnosis present

## 2015-07-20 DIAGNOSIS — I110 Hypertensive heart disease with heart failure: Secondary | ICD-10-CM

## 2015-07-20 LAB — BASIC METABOLIC PANEL
Anion gap: 5 (ref 5–15)
BUN: 26 mg/dL — AB (ref 6–20)
CALCIUM: 8.8 mg/dL — AB (ref 8.9–10.3)
CO2: 27 mmol/L (ref 22–32)
CREATININE: 1.15 mg/dL — AB (ref 0.44–1.00)
Chloride: 110 mmol/L (ref 101–111)
GFR, EST AFRICAN AMERICAN: 51 mL/min — AB (ref >60)
GFR, EST NON AFRICAN AMERICAN: 44 mL/min — AB (ref >60)
Glucose, Bld: 302 mg/dL — ABNORMAL HIGH (ref 65–99)
Potassium: 4.2 mmol/L (ref 3.5–5.1)
SODIUM: 142 mmol/L (ref 135–145)

## 2015-07-20 LAB — GLUCOSE, CAPILLARY
GLUCOSE-CAPILLARY: 188 mg/dL — AB (ref 65–99)
GLUCOSE-CAPILLARY: 185 mg/dL — AB (ref 65–99)
Glucose-Capillary: 95 mg/dL (ref 65–99)
Glucose-Capillary: 194 mg/dL — ABNORMAL HIGH (ref 65–99)

## 2015-07-20 MED ORDER — FUROSEMIDE 10 MG/ML IJ SOLN
20.0000 mg | Freq: Once | INTRAMUSCULAR | Status: AC
  Administered 2015-07-20: 20 mg via INTRAVENOUS
  Filled 2015-07-20: qty 2

## 2015-07-20 MED ORDER — INSULIN GLARGINE 100 UNIT/ML ~~LOC~~ SOLN
25.0000 [IU] | Freq: Every day | SUBCUTANEOUS | Status: DC
  Administered 2015-07-21 – 2015-07-23 (×3): 25 [IU] via SUBCUTANEOUS
  Filled 2015-07-20 (×4): qty 0.25

## 2015-07-20 MED ORDER — HYDROCODONE-ACETAMINOPHEN 7.5-325 MG PO TABS
1.0000 | ORAL_TABLET | Freq: Four times a day (QID) | ORAL | Status: DC | PRN
  Administered 2015-07-20 – 2015-07-26 (×6): 1 via ORAL
  Filled 2015-07-20 (×6): qty 1

## 2015-07-20 MED ORDER — FUROSEMIDE 10 MG/ML IJ SOLN
40.0000 mg | Freq: Two times a day (BID) | INTRAMUSCULAR | Status: DC
  Administered 2015-07-20 – 2015-07-21 (×2): 40 mg via INTRAVENOUS
  Filled 2015-07-20 (×2): qty 4

## 2015-07-20 NOTE — Consult Note (Signed)
Cardiology Consult    Patient ID: ENSLEY BLAS MRN: [id number], DOB/AGE: Sep 29, 1934   Admit date: 07/19/2015 Date of Consult: 07/20/2015  Primary Physician: [name], MD Primary Cardiologist: Concha Se, MD  Requesting Provider: Kathie Rhodes. Patel  Patient Profile    81 y/o ? with a h/o CAD, PAF, DM, and diast CHF, who was admitted 3/1 secondary to progressive dyspnea and volume overload.  Past Medical History   Past Medical History  Diagnosis Date  . DM (diabetes mellitus) (HCC)   . PAF (paroxysmal atrial fibrillation) (HCC)     a. s/p dccv 04/2014;  b. amio/eliquis;  c. 06/2013 bb/ccb d/c 2/2 symptomatic bradycardia; d. 04/2015 recurrent AF->amio increased/bb resumed.  . Dyslipidemia   . CAD (coronary artery disease)     a. s/p prior PCI RCA;  b. 04/2013 Neg MV;  c. 05/2013 Cath: LM 20, LAD 30p, LCX 20p, OM1 90 small, RCA 64m (PTCA - FFR 0.86), PDA 40.  . Osteoarthritis   . Hypothyroidism     hx  . Rheumatoid arthritis(714.0)   . Obesity   . GERD (gastroesophageal reflux disease)   . Shoulder fracture, left     a. Dr. Martha Clan  . Cerebral infarction (HCC)   . CKD (chronic kidney disease), stage III   . Asthma   . Chronic diastolic CHF (congestive heart failure) (HCC)     a. 10/2013 Echo: EF 60-65%, no rwma, Gr1 DD, mild MR, mildly dil LA.  Marland Kitchen Hypertensive heart disease   . Right rib fracture     a. 04/2015.    Past Surgical History  Procedure Laterality Date  . Rca stent placement  2000  . Restenosis with ptca placement  2005  . Cataract surgery    . Partial hysterectomy    . Replacement total knee  2013    right  . Shoulder surgery  02/04/13  . Cardiac catheterization  06/11/2013  . Coronary angioplasty       Allergies  Allergies  Allergen Reactions  . Oxycodone Nausea And Vomiting    History of Present Illness    81 y/o ? with the above complex PMH.  She has a h/o CAD and is s/p prior RCA interventions, the last of which took place in 05/2013  related to in-stent restenosis.  She also has a h/o PAF and diastolic CHF.  She is s/p DCCV in 04/2014 and was doing well but in Dec. 2016, she c/o dyspnea, fatigue, and palpitations and was found to be in AF.  Her amio was increased to 400 BID for a week in hopes that she would chemically convert, but when she didn't, it was cut back to 200 BID and discussions were held re: DCCV.  She fell in late December and fx a rib on her right side.  Following that, she was reluctant to undergo any procedures.  Since December, she has had progressive weight gain, DOE, and lower ext edema.  She has been seen on a number of occasions by primary care with adjustment in her outpt lasix dose from 40 daily to 40 BID (Jan 26th) and subsequently to 80 in the AM and 40 in the PM (2/28).  Despite titration, she has only had progression of symptoms, to the point that she becomes very dyspneic with walking a few feet to the bathroom.  She has had intermittent/fleeting twinges of c/p but nothing that she would recognize as angina.  That said, she also notes the a large portion of her prior  anginal equivalent was DOE.  Due to progression of Ss, she presented to the ED on 3/1, where she was found to be volume overloaded and was admitted for diuresis.  With diuresis, she has had some symptomatic improvement and notes less LEE this AM.  We've been asked to eval.  Inpatient Medications    . amiodarone  200 mg Oral BID  . amLODipine  10 mg Oral Daily  . apixaban  5 mg Oral BID  . atorvastatin  20 mg Oral Daily  . ferrous sulfate  325 mg Oral Q breakfast  . furosemide  40 mg Intravenous Q12H  . gabapentin  300 mg Oral TID  . insulin aspart  0-9 Units Subcutaneous TID WC  . insulin glargine  50 Units Subcutaneous Daily  . latanoprost  1 drop Both Eyes QHS  . levothyroxine  150 mcg Oral QAC breakfast  . lisinopril  10 mg Oral Daily  . metFORMIN  500 mg Oral BID WC  . metoprolol tartrate  25 mg Oral BID  . pantoprazole  40 mg Oral  Daily  . potassium chloride SA  20 mEq Oral Daily  . sertraline  50 mg Oral Daily  . sodium chloride flush  3 mL Intravenous Q12H    Family History    Family History  Problem Relation Age of Onset  . Esophageal cancer Mother   . Diabetes Mother   . Cancer Mother     esophageal     Social History    Social History   Social History  . Marital Status: Married    Spouse Name: N/A  . Number of Children: 3  . Years of Education: N/A   Occupational History  . retired    Social History Main Topics  . Smoking status: Never Smoker   . Smokeless tobacco: Never Used     Comment: former passive smoker  . Alcohol Use: No  . Drug Use: No  . Sexual Activity: No   Other Topics Concern  . Not on file   Social History Narrative   Widow; lives in [place]. Three children. 4 grandchildren; 4 great grandchildren   Regular exercise: yes   Caffeine use: tea occasionally     Review of Systems    General:  No chills, fever, night sweats or weight changes.  Cardiovascular:  No chest pain, +++ dyspnea on exertion, +++ LE edema, +++ orthopnea, occas palpitations, no paroxysmal nocturnal dyspnea. Dermatological: No rash, lesions/masses Respiratory: No cough, +++ dyspnea Urologic: No hematuria, dysuria Abdominal:   No nausea, vomiting, diarrhea, bright red blood per rectum, melena, or hematemesis Neurologic:  No visual changes, wkns, changes in mental status. MSK: Right sided rib pain s/p fall. All other systems reviewed and are otherwise negative except as noted above.  Physical Exam    Blood pressure 115/58, pulse 141, temperature 97.8 F (36.6 C), temperature source Oral, resp. rate 16, height  (1.575 m), weight 225 lb (102.059 kg), SpO2 92 %.  General: Pleasant, NAD Psych: Normal affect. Neuro: Alert and oriented X 3. Moves all extremities spontaneously. HEENT: Normal  Neck: Supple without bruits.  JVP to jaw. Lungs:  Resp regular and unlabored, bibasilar crackles. Heart:  Irreg, brady, no s3, s4, or murmurs. Abdomen: semi-firm, protuberant, non-tender, BS + x 4.  Extremities: No clubbing, cyanosis.  Trace to 1+ bilat ankle edema. DP/PT/Radials 2+ and equal bilaterally.  Labs     Recent Labs  07/19/15 1316  TROPONINI 0.03   Lab Results  Component  Value Date   WBC 7.0 07/19/2015   HGB 9.9* 07/19/2015   HCT 30.3* 07/19/2015   MCV 83.9 07/19/2015   PLT 321 07/19/2015     Recent Labs Lab 07/20/15 0432  NA 142  K 4.2  CL 110  CO2 27  BUN 26*  CREATININE 1.15*  CALCIUM 8.8*  GLUCOSE 302*   Lab Results  Component Value Date   CHOL 101 04/06/2015   HDL 37.20* 04/06/2015   LDLCALC 46 04/06/2015   TRIG 88.0 04/06/2015     Radiology Studies    Dg Chest 2 View  07/19/2015  CLINICAL DATA:  Chest pain under the left breast since 07/14/2015. Initial encounter. EXAM: CHEST  2 VIEW COMPARISON:  PA and lateral chest 06/15/2015. Plain film of the chest and right ribs 05/07/2015. FINDINGS: Lung volumes are somewhat low with mild basilar atelectasis. There is cardiomegaly without edema. No pneumothorax or pleural effusion. IMPRESSION: Cardiomegaly without acute disease. Electronically Signed   By: Drusilla Kanner M.D.   On: 07/19/2015 13:47    ECG & Cardiac Imaging    AFib, 51, nonspec st/t changes - no acute changes.  Assessment & Plan    1.  Acute on chronic diastolic CHF:  Pt presented to [facility] on 3/1 with a 2-3 month h/o progressive DOE, fatigue, and wt gain in the setting of persistent AFib since December.  Symptoms of dyspnea and lower ext edema have been particularly bad over the past month and have persisted despite outpt adjustment of lasix.  Her weight is up 25 lbs since mid-December.  She does have bibasilar crackles with JVD, some increase in abd girth, and currently mild LEE (she says it was worse yesterday).  Echo has been ordered (nl LV 10/2013).  Advance IV diuresis to 40 BID.  I suspect recurrent AFib is driving her diastolic dysfunction  and she is not tolerating loss of atrial kick despite very good rate control (bradycardic).  Once breathing stable and volume status closer to baseline, we will need to consider repeat DCCV as she has been on a higher dose of amio since mid-December.  2.  CAD:  S/p prior RCA interventions, the last of which was in 2015.  She also has known moderate Ramus dzs.  She has not been having angina but notes that her DOE was a significant portion of her prior anginal equivalent. Troponin is nl and ECG non-acute.  Provided that CE remain neg, we could consider stress testing once volume status improved and rhythm dealt with.  Cont  blocker, acei, statin.  No asa as she is on eliquis.  3.  PAF:  She has been in AF since @ least mid-December.  At that point, her amio was increased to 400 BID for a week in hopes of chemical cardioversion.  She is currently on 200 BID.  Earlier this year, she was reluctant to pursue DCCV as she was hoping to recover from a fx rib on the right first.  She would be willing to undergo DCCV @ this time if we felt it to be appropriate.  I suspect that AFib is a major driver of her dyspnea and diast CHF @ this point.  Cont amio, bb, eliquis.  I will d/w Dr. Mariah Milling.  Would prob have to d/c  blocker prior to dccv given baseline bradycardia.  4.  Hypertensive Heart Disease:  Stable.  5.  Type II DM:  Per IM.  6.  CKD III:  Creat stable.  Cont acei.  Signed, Nicolasa Ducking, NP 07/20/2015, 11:39 AM

## 2015-07-20 NOTE — Progress Notes (Signed)
[name] [name] is a 81 y.o. female  [id number]  Primary Cardiologist: Adrian Blackwater Reason for Consultation: CHF  HPI: 81 year old white female with a past medical history of CVA coronary artery disease status post PCI and stenting of the right coronary with bare-metal stent presented to the emergency room with shortness of breath orthopnea PND and leg swelling for the past 5 days.   Review of Systems: No chest pain   Past Medical History  Diagnosis Date  . DM (diabetes mellitus) (HCC)   . PAF (paroxysmal atrial fibrillation) (HCC)     a. s/p dccv 03/2014;  b. amio/eliquis;  c. pt d/c'd amio 05/2014;  d. 06/2013 bb/ccb d/c 2/2 symptomatic bradycardia.  . Dyslipidemia   . CAD (coronary artery disease)     a. s/p prior PCI RCA with ISR req CBA;  b. 04/2013 Neg MV;  c. 05/2013 Cath: LM 20, LAD 30p, LCX 20p, OM1 90 small, RCA 41m, PDA 40-->Med Rx.  . Osteoarthritis   . Hypothyroidism     hx  . Rheumatoid arthritis(714.0)   . Obesity   . GERD (gastroesophageal reflux disease)   . Shoulder fracture, left     Dr. Martha Clan  . Cerebral infarction (HCC)   . CKD (chronic kidney disease), stage III   . Asthma   . CHF (congestive heart failure) (HCC)   . Hypertension   . Renal insufficiency     Medications Prior to Admission  Medication Sig Dispense Refill  . albuterol (PROVENTIL HFA;VENTOLIN HFA) 108 (90 BASE) MCG/ACT inhaler Inhale 2 puffs into the lungs every 6 (six) hours as needed for wheezing or shortness of breath. 1 Inhaler 0  . amiodarone (PACERONE) 200 MG tablet Take 1 tablet (200 mg total) by mouth 2 (two) times daily. 70 tablet 3  . amLODipine (NORVASC) 10 MG tablet Take 1 tablet (10 mg total) by mouth daily. 30 tablet 5  . apixaban (ELIQUIS) 5 MG TABS tablet Take 5 mg by mouth 2 (two) times daily.    Marland Kitchen atorvastatin (LIPITOR) 20 MG tablet Take 1 tablet (20 mg total) by mouth daily. 90 tablet 3  . ferrous sulfate 325 (65 FE) MG tablet Take 325 mg by mouth daily with  breakfast.    . fluticasone (FLONASE) 50 MCG/ACT nasal spray Place 2 sprays into both nostrils daily as needed for rhinitis.    . furosemide (LASIX) 40 MG tablet Take 1 tablet (40 mg total) by mouth daily. 180 tablet 3  . gabapentin (NEURONTIN) 300 MG capsule Take 1 capsule (300 mg total) by mouth 3 (three) times daily. 270 capsule 3  . insulin aspart (NOVOLOG) 100 UNIT/ML injection Inject 2-10 Units into the skin 3 (three) times daily with meals as needed for high blood sugar. Pt uses per sliding scale:  140-199:  2 units  200-250:  4 units  251-299:  6 units  300-349:  8 units  Greater than 350: 10 units    . insulin glargine (LANTUS) 100 UNIT/ML injection Inject 50 Units into the skin daily.    Marland Kitchen latanoprost (XALATAN) 0.005 % ophthalmic solution Place 1 drop into both eyes at bedtime.     Marland Kitchen levothyroxine (SYNTHROID, LEVOTHROID) 150 MCG tablet Take 150 mcg by mouth daily before breakfast.    . lisinopril (PRINIVIL,ZESTRIL) 10 MG tablet Take 1 tablet (10 mg total) by mouth daily. 90 tablet 3  . meclizine (ANTIVERT) 25 MG tablet Take 25 mg by mouth 3 (three) times daily as needed for dizziness.    Marland Kitchen  metFORMIN (GLUCOPHAGE) 500 MG tablet Take 1 tablet (500 mg total) by mouth 2 (two) times daily. 180 tablet 3  . metoprolol tartrate (LOPRESSOR) 25 MG tablet Take 1 tablet (25 mg total) by mouth 2 (two) times daily. 180 tablet 3  . nitroGLYCERIN (NITROSTAT) 0.4 MG SL tablet Place 0.4 mg under the tongue every 5 (five) minutes as needed for chest pain.    Marland Kitchen omeprazole (PRILOSEC) 20 MG capsule Take 1 capsule (20 mg total) by mouth 2 (two) times daily. 180 capsule 3  . ondansetron (ZOFRAN-ODT) 4 MG disintegrating tablet Take 1 tablet (4 mg total) by mouth every 8 (eight) hours as needed for nausea or vomiting. 20 tablet 0  . potassium chloride SA (K-DUR,KLOR-CON) 20 MEQ tablet Take 1 tablet (20 mEq total) by mouth daily. 180 tablet 3  . sertraline (ZOLOFT) 50 MG tablet Take 1 tablet (50 mg total) by  mouth daily. 90 tablet 0  . traMADol (ULTRAM) 50 MG tablet Take 100 mg by mouth every 8 (eight) hours as needed for moderate pain.    Marland Kitchen gentamicin ointment (GARAMYCIN) 0.1 % Apply 1 application topically 3 (three) times daily. (Patient not taking: Reported on 07/19/2015) 15 g 0  . HYDROcodone-acetaminophen (NORCO/VICODIN) 5-325 MG tablet Take 1-2 tablets by mouth every 6 (six) hours as needed for moderate pain. (Patient not taking: Reported on 07/19/2015) 30 tablet 0     . amiodarone  200 mg Oral BID  . amLODipine  10 mg Oral Daily  . apixaban  5 mg Oral BID  . atorvastatin  20 mg Oral Daily  . ferrous sulfate  325 mg Oral Q breakfast  . furosemide  20 mg Intravenous Q12H  . gabapentin  300 mg Oral TID  . insulin aspart  0-9 Units Subcutaneous TID WC  . insulin glargine  50 Units Subcutaneous Daily  . latanoprost  1 drop Both Eyes QHS  . levothyroxine  150 mcg Oral QAC breakfast  . lisinopril  10 mg Oral Daily  . metFORMIN  500 mg Oral BID WC  . metoprolol tartrate  25 mg Oral BID  . pantoprazole  40 mg Oral Daily  . potassium chloride SA  20 mEq Oral Daily  . sertraline  50 mg Oral Daily  . sodium chloride flush  3 mL Intravenous Q12H    Infusions:    Allergies  Allergen Reactions  . Oxycodone Nausea And Vomiting    Social History   Social History  . Marital Status: Married    Spouse Name: N/A  . Number of Children: 3  . Years of Education: N/A   Occupational History  . retired    Social History Main Topics  . Smoking status: Never Smoker   . Smokeless tobacco: Never Used     Comment: former passive smoker  . Alcohol Use: No  . Drug Use: No  . Sexual Activity: No   Other Topics Concern  . Not on file   Social History Narrative   Widow; lives in [place]. Three children. 4 grandchildren; 4 great grandchildren   Regular exercise: yes   Caffeine use: tea occasionally    Family History  Problem Relation Age of Onset  . Esophageal cancer Mother   . Diabetes  Mother   . Cancer Mother     esophageal     PHYSICAL EXAM: Filed Vitals:   07/20/15 0444 07/20/15 0819  BP: 117/52 114/55  Pulse: 48   Temp: 98 F (36.7 C)   Resp: 20  Intake/Output Summary (Last 24 hours) at 07/20/15 0927 Last data filed at 07/20/15 0444  Gross per 24 hour  Intake      0 ml  Output    750 ml  Net   -750 ml    General:  Well appearing. No respiratory difficulty HEENT: normal Neck: supple. no JVD. Carotids 2+ bilat; no bruits. No lymphadenopathy or thryomegaly appreciated. Cor: PMI nondisplaced. Regular rate & rhythm. No rubs, gallops or murmurs. Lungs: clear Abdomen: soft, nontender, nondistended. No hepatosplenomegaly. No bruits or masses. Good bowel sounds. Extremities: no cyanosis, clubbing, rash, edema Neuro: alert & oriented x 3, cranial nerves grossly intact. moves all 4 extremities w/o difficulty. Affect pleasant.  ECG: Atrial fibrillation with controlled ventricular response with poor R-wave progression nonspecific ST-T changes  Results for orders placed or performed during the hospital encounter of 07/19/15 (from the past 24 hour(s))  Basic metabolic panel     Status: Abnormal   Collection Time: 07/19/15  1:16 PM  Result Value Ref Range   Sodium 140 135 - 145 mmol/L   Potassium 3.5 3.5 - 5.1 mmol/L   Chloride 108 101 - 111 mmol/L   CO2 24 22 - 32 mmol/L   Glucose, Bld 220 (H) 65 - 99 mg/dL   BUN 20 6 - 20 mg/dL   Creatinine, Ser 9.24 (H) 0.44 - 1.00 mg/dL   Calcium 9.1 8.9 - 46.2 mg/dL   GFR calc non Af Amer 47 (L) >60 mL/min   GFR calc Af Amer 54 (L) >60 mL/min   Anion gap 8 5 - 15  CBC     Status: Abnormal   Collection Time: 07/19/15  1:16 PM  Result Value Ref Range   WBC 7.0 3.6 - 11.0 K/uL   RBC 3.61 (L) 3.80 - 5.20 MIL/uL   Hemoglobin 9.9 (L) 12.0 - 16.0 g/dL   HCT 86.3 (L) 81.7 - 71.1 %   MCV 83.9 80.0 - 100.0 fL   MCH 27.5 26.0 - 34.0 pg   MCHC 32.8 32.0 - 36.0 g/dL   RDW 65.7 (H) 90.3 - 83.3 %   Platelets 321 150 - 440  K/uL  Protime-INR - (order if Patient is taking Coumadin / Warfarin)     Status: Abnormal   Collection Time: 07/19/15  1:16 PM  Result Value Ref Range   Prothrombin Time 15.7 (H) 11.4 - 15.0 seconds   INR 1.23   Troponin I     Status: None   Collection Time: 07/19/15  1:16 PM  Result Value Ref Range   Troponin I 0.03 <0.031 ng/mL  Brain natriuretic peptide     Status: Abnormal   Collection Time: 07/19/15  1:16 PM  Result Value Ref Range   B Natriuretic Peptide 316.0 (H) 0.0 - 100.0 pg/mL  Glucose, capillary     Status: Abnormal   Collection Time: 07/19/15  3:38 PM  Result Value Ref Range   Glucose-Capillary 48 (L) 65 - 99 mg/dL  Glucose, capillary     Status: None   Collection Time: 07/19/15  4:40 PM  Result Value Ref Range   Glucose-Capillary 96 65 - 99 mg/dL  Glucose, capillary     Status: Abnormal   Collection Time: 07/19/15  8:52 PM  Result Value Ref Range   Glucose-Capillary 282 (H) 65 - 99 mg/dL  Basic metabolic panel     Status: Abnormal   Collection Time: 07/20/15  4:32 AM  Result Value Ref Range   Sodium 142 135 - 145 mmol/L  Potassium 4.2 3.5 - 5.1 mmol/L   Chloride 110 101 - 111 mmol/L   CO2 27 22 - 32 mmol/L   Glucose, Bld 302 (H) 65 - 99 mg/dL   BUN 26 (H) 6 - 20 mg/dL   Creatinine, Ser 5.46 (H) 0.44 - 1.00 mg/dL   Calcium 8.8 (L) 8.9 - 10.3 mg/dL   GFR calc non Af Amer 44 (L) >60 mL/min   GFR calc Af Amer 51 (L) >60 mL/min   Anion gap 5 5 - 15  Glucose, capillary     Status: Abnormal   Collection Time: 07/20/15  7:44 AM  Result Value Ref Range   Glucose-Capillary 188 (H) 65 - 99 mg/dL   Dg Chest 2 View  5/0/3546  CLINICAL DATA:  Chest pain under the left breast since 07/14/2015. Initial encounter. EXAM: CHEST  2 VIEW COMPARISON:  PA and lateral chest 06/15/2015. Plain film of the chest and right ribs 05/07/2015. FINDINGS: Lung volumes are somewhat low with mild basilar atelectasis. There is cardiomegaly without edema. No pneumothorax or pleural effusion.  IMPRESSION: Cardiomegaly without acute disease. Electronically Signed   By: Drusilla Kanner M.D.   On: 07/19/2015 13:47     ASSESSMENT AND PLAN: Congestive heart failure with repeated episodes of CHF but no chest pain right now. Advise aggressive medical therapy for diastolic dysfunction heart failure with Lasix and nitrates and Ace inhibitors. May have to do further workup after an echocardiogram initially but will decide after looking at echocardiogram worse the next procedure.  Brooks Kinnan A

## 2015-07-20 NOTE — Clinical Social Work Note (Signed)
Clinical Social Work Assessment  Patient Details  Name: [name] [name] MRN: [id number] Date of Birth: 12/17/1934  Date of referral:  07/20/15               Reason for consult:  Discharge Planning                Permission sought to share information with:    Permission granted to share information::  No  Name::        Agency::     Relationship::     Contact Information:     Housing/Transportation Living arrangements for the past 2 months:  Single Family Home Source of Information:  Patient Patient Interpreter Needed:  None Criminal Activity/Legal Involvement Pertinent to Current Situation/Hospitalization:  No - Comment as needed Significant Relationships:  Adult Children, Other Family Members Lives with:  Self Do you feel safe going back to the place where you live?  Yes Need for family participation in patient care:  No (Coment)  Care giving concerns:  Patient reports she needs assistance with Transportation needs. PT recommended SNF.    Social Worker assessment / plan:  CSW was consulted for transportation needs and SNF placement. CSW met with patient at bedside. CSW explained her role. CSW inquired about discharge plans. Patient reports that she lives alone and has a walker and cane. She reports that she's not interested in SNF placement because "I've had it in the past and they hurt me more". CSW left SNF list for patient to think about. Patient reports that she's open to HH if needed. Patient reports that she will not consider SNF. CSW provided patient [place] [facility] information and dicussed their services. Patient reports that she understands. CSW informed RN Case Manager of above. CSW is signing off and is available if a social work need were to arise.   Employment status:  Retired Insurance information:  Medicare PT Recommendations:  Skilled Nursing Facility Information / Referral to community resources:  Skilled Nursing  Facility  Patient/Family's Response to care:  Patient refused SNF and is open to HH.   Patient/Family's Understanding of and Emotional Response to Diagnosis, Current Treatment, and Prognosis:  Patient understands CSW's role and is appreciative of her assistance and resources.   Emotional Assessment Appearance:  Appears stated age Attitude/Demeanor/Rapport:   (None) Affect (typically observed):  Accepting, Calm, Pleasant Orientation:  Oriented to Self, Oriented to Place, Oriented to  Time, Oriented to Situation Alcohol / Substance use:  Not Applicable Psych involvement (Current and /or in the community):  No (Comment)  Discharge Needs  Concerns to be addressed:  Discharge Planning Concerns Readmission within the last 30 days:  No Current discharge risk:  Chronically ill Barriers to Discharge:  Continued Medical Work up    E , LCSW 07/20/2015, 3:50 PM  

## 2015-07-20 NOTE — Progress Notes (Signed)
Inpatient Diabetes Program Recommendations  AACE/ADA: New Consensus Statement on Inpatient Glycemic Control (2015)  Target Ranges:  Prepandial:   less than 140 mg/dL      Peak postprandial:   less than 180 mg/dL (1-2 hours)      Critically ill patients:  140 - 180 mg/dL   Review of Glycemic Control  Results for SHANTRELL, PLACZEK (MRN [id number]) as of 07/20/2015 09:16  Ref. Range 07/19/2015 15:38 07/19/2015 16:40 07/19/2015 20:52 07/20/2015 07:44  Glucose-Capillary Latest Ref Range: 65-99 mg/dL 48 (L) 96 536 (H) 644 (H)    Diabetes history: Type 2 Outpatient Diabetes medications: Novolog 2-10 units tid, Lantus 50 units qam, Metformin 500mg  bid   Current orders for Inpatient glycemic control: Novolog 0-9 units tid with meals, Metformin 500mg  bid, Lantus 50 units qday  Inpatient Diabetes Program Recommendations: Spoke with patient today- she denies taking any Novolog yesterday before she came to the hospital because she didn't eat much. She could not remember if she took her Lantus yesterday morning but when I asked her how she remembers each day her comment was "I just take it- it's what I do". She uses insulin from a bottle, not an insulin pen.  Recommend decreasing Lantus insulin to 25 units qam. Continue Novolog correction as ordered. Consider stopping Metformin.   Patient did not complete her entire breakfast- please cater to patient preferences and order foods she's willing to eat.     , RN, BA, MHA, CDE Diabetes Coordinator Inpatient Diabetes Program  [phone] (Team Pager) [phone] [facility] Office) 07/20/2015 10:00 AM

## 2015-07-20 NOTE — Evaluation (Signed)
Physical Therapy Evaluation Patient Details Name: [name] [name] MRN: [id number] DOB: [birth year] Today's Date: 07/20/2015   History of Present Illness  [name] [name] is a 81 y.o. female with a known history of chronic diastolic congestive heart failure, CAD status post stent placement 2014, hypothyroidism, rheumatoid arthritis, type 2 diabetes comes to the emergency room with complaints of increasing shortness of breath and leg swelling and weight gain of 15 pounds. She was seen by primary care physician yesterday and was asked to take extra doses of Lasix. Patient was also ask him to she decided to come today since she didn't have a ride yesterday. Patient was found to be in acute on chronic congestive heart failure diastolic (for further evaluation and management management. She did receive IV Lasix in the emergency room. Pt reports 2 falls in the last 12 months.  Clinical Impression  Pt demonstrates significant deconditioning as well as severe DOE with very minimal activity. She is only able to ambulate approximately 6' due to DOE and LE buckling. Pt not safe with walking and is a high risk for recurrent falls. Recommend DC to SNF however pt refuses at this time. Pt states that she doesn't have anyone who can assist her at home but states, "I will find someone." At this time pt would be unable to enter home via stairs or safely ambulate appropriate household distance. Pt does not demonstrates good insight into personal safety with mobility during conversation. Pt will benefit from skilled PT services to address deficits in strength, balance, and mobility in order to return to full function at home.     Follow Up Recommendations SNF (Pt refusing)    Equipment Recommendations  None recommended by PT    Recommendations for Other Services       Precautions / Restrictions Precautions Precautions: Fall Restrictions Weight Bearing Restrictions: No      Mobility  Bed Mobility Overal bed  mobility: Needs Assistance Bed Mobility: Supine to Sit     Supine to sit: Min guard     General bed mobility comments: HOB elevated, no bed rails to simulate mobility in recliner. Pt takes excessive time to perform bed mobility due to weakness and DOE. Requires extended rest break once upright due to SOB  Transfers Overall transfer level: Needs assistance Equipment used: Rolling walker (2 wheeled) Transfers: Sit to/from Stand Sit to Stand: Min guard         General transfer comment: Pt requires 2 attempts to come to standing. Unstable in standing requiring UE support on rolling walker for balance. Pt with decreased LE strength/power  Ambulation/Gait Ambulation/Gait assistance: Min guard Ambulation Distance (Feet): 6 Feet Assistive device: Rolling walker (2 wheeled) Gait Pattern/deviations: Step-to pattern;Antalgic Gait velocity: Decreased Gait velocity interpretation: <1.8 ft/sec, indicative of risk for recurrent falls General Gait Details: Pt with L antalgic gait pattern. Steps are very short and labored. Pt with intermittent LE buckling but able to self correct with UE on walker. Pt with severe DOE during very short distance ambulation. Elected not to ambulate further due to lack of safety by patient and DOE. SaO2 remains >90% on room air and HR 80-90s bpm.  Stairs            Wheelchair Mobility    Modified Rankin (Stroke Patients Only)       Balance Overall balance assessment: Needs assistance Sitting-balance support: No upper extremity supported Sitting balance-Leahy Scale: Fair     Standing balance support: Bilateral upper extremity supported Standing balance-Leahy Scale: Poor  Pertinent Vitals/Pain Pain Assessment: No/denies pain    Home Living Family/patient expects to be discharged to:: Private residence Living Arrangements: Alone Available Help at Discharge: Other (Comment) (Pt states no friends/family who  can help) Type of Home: House Home Access: Stairs to enter Entrance Stairs-Rails: Right Entrance Stairs-Number of Steps: 3 Home Layout: One level Home Equipment: Cane - single point;Walker - 4 wheels (no hospital bed, no grab bars, no wheelchair)      Prior Function Level of Independence: Independent with assistive device(s)         Comments: Single point cane for limited community ambulation. Limited driving so has assist from friend with transportation. Assist with IADLs     Hand Dominance   Dominant Hand: Right    Extremity/Trunk Assessment   Upper Extremity Assessment: Generalized weakness           Lower Extremity Assessment: Generalized weakness         Communication   Communication: No difficulties  Cognition Arousal/Alertness: Awake/alert Behavior During Therapy: WFL for tasks assessed/performed Overall Cognitive Status: Within Functional Limits for tasks assessed                      General Comments      Exercises General Exercises - Lower Extremity Ankle Circles/Pumps: Strengthening;Both;10 reps;Supine Quad Sets: Strengthening;Both;10 reps;Supine Gluteal Sets: Strengthening;Both;10 reps;Supine Heel Slides: Strengthening;Both;10 reps;Supine Hip ABduction/ADduction: Strengthening;Both;10 reps;Supine Straight Leg Raises: Strengthening;Both;10 reps;Supine      Assessment/Plan    PT Assessment Patient needs continued PT services  PT Diagnosis Difficulty walking;Abnormality of gait;Generalized weakness   PT Problem List Decreased strength;Decreased activity tolerance;Decreased balance;Decreased mobility;Decreased knowledge of use of DME;Decreased safety awareness;Obesity  PT Treatment Interventions DME instruction;Gait training;Stair training;Therapeutic activities;Therapeutic exercise;Balance training;Neuromuscular re-education;Cognitive remediation;Patient/family education   PT Goals (Current goals can be found in the Care Plan section)  Acute Rehab PT Goals Patient Stated Goal: "I'm going home" PT Goal Formulation: With patient Time For Goal Achievement: 08/03/15 Potential to Achieve Goals: Fair    Frequency Min 2X/week   Barriers to discharge Decreased caregiver support Pt reports no family/friends to assist. Then after refusing SNF placement talks about "finding someone." However pt unable to provide realistic idea about who can assist her    Co-evaluation               End of Session Equipment Utilized During Treatment: Gait belt Activity Tolerance: Patient limited by fatigue Patient left: in chair;with call bell/phone within reach;with bed alarm set           Time: 1038-1100 PT Time Calculation (min) (ACUTE ONLY): 22 min   Charges:   PT Evaluation $PT Eval Moderate Complexity: 1 Procedure PT Treatments $Therapeutic Exercise: 8-22 mins   PT G Codes:       Sharalyn Ink Huprich PT, DPT   Huprich,Jason 07/20/2015, 11:44 AM

## 2015-07-20 NOTE — Progress Notes (Signed)
*  PRELIMINARY RESULTS* Echocardiogram 2D Echocardiogram has been performed.  [name] [name] 07/20/2015, 2:28 PM

## 2015-07-20 NOTE — Progress Notes (Signed)
[facility] - [place] at [facility]   PATIENT NAME: [name] [name]    MR#:  [id number]  DATE OF BIRTH:  [birth year]  SUBJECTIVE:   Patient is here due to shortness of breath, lower extremity swelling and noted to be in CHF. Improved with some IV diuresis.  REVIEW OF SYSTEMS:    Review of Systems  Constitutional: Negative for fever and chills.  HENT: Negative for congestion and tinnitus.   Eyes: Negative for blurred vision and double vision.  Respiratory: Positive for shortness of breath. Negative for wheezing.   Cardiovascular: Negative for chest pain, orthopnea and PND.  Gastrointestinal: Negative for nausea, vomiting, abdominal pain and diarrhea.  Genitourinary: Negative for dysuria and hematuria.  Neurological: Negative for dizziness, sensory change and focal weakness.  All other systems reviewed and are negative.   Nutrition: 2 gram sodium.  Tolerating Diet: yes Tolerating PT: Eval noted   DRUG ALLERGIES:   Allergies  Allergen Reactions  . Oxycodone Nausea And Vomiting    VITALS:  Blood pressure 115/58, pulse 141, temperature 97.8 F (36.6 C), temperature source Oral, resp. rate 16, height 5\' 2"  (1.575 m), weight 102.059 kg (225 lb), SpO2 92 %.  PHYSICAL EXAMINATION:   Physical Exam  GENERAL:  81 y.o.-year-old patient lying in the bed with no acute distress.  EYES: Pupils equal, round, reactive to light and accommodation. No scleral icterus. Extraocular muscles intact.  HEENT: Head atraumatic, normocephalic. Oropharynx and nasopharynx clear.  NECK:  Supple, no jugular venous distention. No thyroid enlargement, no tenderness.  LUNGS: Normal breath sounds bilaterally, no wheezing, bibasilar rales, No rhonchi. No use of accessory muscles of respiration.  CARDIOVASCULAR: S1, S2 Irregular. II/VI SEM at base, No rubs, or gallops.  ABDOMEN: Soft, nontender, nondistended. Bowel sounds present. No organomegaly or mass.  EXTREMITIES: No cyanosis,  clubbing or edema b/l.    NEUROLOGIC: Cranial nerves II through XII are intact. No focal Motor or sensory deficits b/l. Globally weak.   PSYCHIATRIC: The patient is alert and oriented x 3.  SKIN: No obvious rash, lesion, or ulcer.    LABORATORY PANEL:   CBC  Recent Labs Lab 07/19/15 1316  WBC 7.0  HGB 9.9*  HCT 30.3*  PLT 321   ------------------------------------------------------------------------------------------------------------------  Chemistries   Recent Labs Lab 07/20/15 0432  NA 142  K 4.2  CL 110  CO2 27  GLUCOSE 302*  BUN 26*  CREATININE 1.15*  CALCIUM 8.8*   ------------------------------------------------------------------------------------------------------------------  Cardiac Enzymes  Recent Labs Lab 07/19/15 1316  TROPONINI 0.03   ------------------------------------------------------------------------------------------------------------------  RADIOLOGY:  Dg Chest 2 View  07/19/2015  CLINICAL DATA:  Chest pain under the left breast since 07/14/2015. Initial encounter. EXAM: CHEST  2 VIEW COMPARISON:  PA and lateral chest 06/15/2015. Plain film of the chest and right ribs 05/07/2015. FINDINGS: Lung volumes are somewhat low with mild basilar atelectasis. There is cardiomegaly without edema. No pneumothorax or pleural effusion. IMPRESSION: Cardiomegaly without acute disease. Electronically Signed   By: 05/09/2015 M.D.   On: 07/19/2015 13:47     ASSESSMENT AND PLAN:   81 year old female with a history of diabetes, paroxysmal atrial fibrillation, history of diastolic CHF, chronic kidney disease stage III, GERD, rheumatoid arthritis, hypothyroidism who presented to the hospital due to lower extremity edema and shortness of breath and noted to be in congestive heart failure.  #1 acute on chronic diastolic CHF-patient presented to the hospital with 2-3 month history of progressive dyspnea fatigue and weight gain. -Continue diuresis with IV  Lasix, follow I's and O's and daily weights. -Continue metoprolol, lisinopril. Appreciate cardiology input and continue current care.  #2 history of chronic atrial fibrillation-rate controlled. Continue amiodarone, metoprolol. -Continue Eliquis.  #3 diabetes type 2 without complication-continue metformin, sliding scale insulin. Blood sugar stable.  #4 hypothyroidism-continue Synthroid.  #5 glaucoma-continue Xalatan eyedrops.  #6 depression-continue Zoloft.  #7 GERD-continue Protonix.  All the records are reviewed and case discussed with Care Management/Social Workerr. Management plans discussed with the patient, family and they are in agreement.  CODE STATUS: Full  DVT Prophylaxis: Eliquis  TOTAL TIME TAKING CARE OF THIS PATIENT: 30 minutes.   POSSIBLE D/C IN 2-3 DAYS, DEPENDING ON CLINICAL CONDITION.   Houston Siren M.D on 07/20/2015 at 5:06 PM  Between 7am to 6pm - Pager - [phone]  After 6pm go to [email or web address] - password [email or web address] [facility]  [facility]  Office  [phone]  CC: Primary care physician; [name], MD

## 2015-07-21 ENCOUNTER — Ambulatory Visit: Payer: Medicare Other | Admitting: Internal Medicine

## 2015-07-21 LAB — BASIC METABOLIC PANEL
ANION GAP: 8 (ref 5–15)
BUN: 42 mg/dL — ABNORMAL HIGH (ref 6–20)
CALCIUM: 8.8 mg/dL — AB (ref 8.9–10.3)
CO2: 26 mmol/L (ref 22–32)
CREATININE: 1.68 mg/dL — AB (ref 0.44–1.00)
Chloride: 109 mmol/L (ref 101–111)
GFR, EST AFRICAN AMERICAN: 33 mL/min — AB (ref >60)
GFR, EST NON AFRICAN AMERICAN: 28 mL/min — AB (ref >60)
Glucose, Bld: 97 mg/dL (ref 65–99)
Potassium: 4.7 mmol/L (ref 3.5–5.1)
SODIUM: 143 mmol/L (ref 135–145)

## 2015-07-21 LAB — GLUCOSE, CAPILLARY
GLUCOSE-CAPILLARY: 148 mg/dL — AB (ref 65–99)
GLUCOSE-CAPILLARY: 191 mg/dL — AB (ref 65–99)
Glucose-Capillary: 64 mg/dL — ABNORMAL LOW (ref 65–99)
Glucose-Capillary: 70 mg/dL (ref 65–99)
Glucose-Capillary: 108 mg/dL — ABNORMAL HIGH (ref 65–99)
Glucose-Capillary: 180 mg/dL — ABNORMAL HIGH (ref 65–99)

## 2015-07-21 MED ORDER — FUROSEMIDE 10 MG/ML IJ SOLN: 40.0000 mg | Freq: Every day | INTRAMUSCULAR | Status: DC

## 2015-07-21 MED ORDER — ALPRAZOLAM 0.25 MG PO TABS
0.2500 mg | ORAL_TABLET | Freq: Three times a day (TID) | ORAL | Status: DC | PRN
  Administered 2015-07-25 – 2015-07-26 (×3): 0.25 mg via ORAL
  Filled 2015-07-21 (×3): qty 1

## 2015-07-21 NOTE — Progress Notes (Signed)
[facility] - [place] at [facility]   PATIENT NAME: [name] [name]    MR#:  [id number]  DATE OF BIRTH:  1935-05-15  SUBJECTIVE:   Patient had a panic attack this morning and had some vomiting. Still refuses to go to a skilled nursing facility. Improved with some IV diuresis.  REVIEW OF SYSTEMS:    Review of Systems  Constitutional: Negative for fever and chills.  HENT: Negative for congestion and tinnitus.   Eyes: Negative for blurred vision and double vision.  Respiratory: Positive for shortness of breath. Negative for wheezing.   Cardiovascular: Negative for chest pain, orthopnea and PND.  Gastrointestinal: Negative for nausea, vomiting, abdominal pain and diarrhea.  Genitourinary: Negative for dysuria and hematuria.  Neurological: Negative for dizziness, sensory change and focal weakness.  All other systems reviewed and are negative.   Nutrition: 2 gram sodium.  Tolerating Diet: yes Tolerating PT: Eval noted   DRUG ALLERGIES:   Allergies  Allergen Reactions  . Oxycodone Nausea And Vomiting    VITALS:  Blood pressure 115/57, pulse 59, temperature 97.7 F (36.5 C), temperature source Oral, resp. rate 18, height 5\' 2"  (1.575 m), weight 101.424 kg (223 lb 9.6 oz), SpO2 86 %.  PHYSICAL EXAMINATION:   Physical Exam  GENERAL:  81 y.o.-year-old patient lying in the bed with no acute distress.  EYES: Pupils equal, round, reactive to light and accommodation. No scleral icterus. Extraocular muscles intact.  HEENT: Head atraumatic, normocephalic. Oropharynx and nasopharynx clear.  NECK:  Supple, no jugular venous distention. No thyroid enlargement, no tenderness.  LUNGS: Normal breath sounds bilaterally, no wheezing, bibasilar rales, No rhonchi. No use of accessory muscles of respiration.  CARDIOVASCULAR: S1, S2 Irregular. II/VI SEM at base, No rubs, or gallops.  ABDOMEN: Soft, nontender, nondistended. Bowel sounds present. No organomegaly or mass.   EXTREMITIES: No cyanosis, clubbing or edema b/l.    NEUROLOGIC: Cranial nerves II through XII are intact. No focal Motor or sensory deficits b/l. Globally weak.   PSYCHIATRIC: The patient is alert and oriented x 3.  SKIN: No obvious rash, lesion, or ulcer.    LABORATORY PANEL:   CBC  Recent Labs Lab 07/19/15 1316  WBC 7.0  HGB 9.9*  HCT 30.3*  PLT 321   ------------------------------------------------------------------------------------------------------------------  Chemistries   Recent Labs Lab 07/21/15 0437  NA 143  K 4.7  CL 109  CO2 26  GLUCOSE 97  BUN 42*  CREATININE 1.68*  CALCIUM 8.8*   ------------------------------------------------------------------------------------------------------------------  Cardiac Enzymes  Recent Labs Lab 07/19/15 1316  TROPONINI 0.03   ------------------------------------------------------------------------------------------------------------------  RADIOLOGY:  No results found.   ASSESSMENT AND PLAN:   81 year old female with a history of diabetes, paroxysmal atrial fibrillation, history of diastolic CHF, chronic kidney disease stage III, GERD, rheumatoid arthritis, hypothyroidism who presented to the hospital due to lower extremity edema and shortness of breath and noted to be in congestive heart failure.  #1 acute on chronic diastolic CHF-patient presented to the hospital with 2-3 month history of progressive dyspnea fatigue and weight gain. -Continue diuresis with IV Lasix, about 1 L (-) since yesterday.  -Continue metoprolol, lisinopril. Appreciate cardiology input and continue current care.  #2 history of chronic atrial fibrillation-rate controlled. Continue amiodarone, metoprolol. -Continue Eliquis.  #3 diabetes type 2 without complication-continue metformin, sliding scale insulin. Blood sugar stable.  #4 hypothyroidism-continue Synthroid.  #5 glaucoma-continue Xalatan eyedrops.  #6 depression-continue  Zoloft.  #7 GERD-continue Protonix.  #8 Anxiety - PRN Xanax.   PT eval noted  patient needs skilled nursing facility but still refuses. DC home in the next 1-2 days with home with home health services.  All the records are reviewed and case discussed with Care Management/Social Workerr. Management plans discussed with the patient, family and they are in agreement.  CODE STATUS: Full  DVT Prophylaxis: Eliquis  TOTAL TIME TAKING CARE OF THIS PATIENT: 30 minutes.   POSSIBLE D/C IN 2-3 DAYS, DEPENDING ON CLINICAL CONDITION.   Houston Siren M.D on 07/21/2015 at 4:34 PM  Between 7am to 6pm - Pager - [phone]  After 6pm go to [email or web address] - password [email or web address] [facility]  [place] [facility]  Office  [phone]  CC: Primary care physician; [name], MD

## 2015-07-21 NOTE — Progress Notes (Signed)
SATURATION QUALIFICATIONS: (This note is used to comply with regulatory documentation for home oxygen)  Patient Saturations on Room Air at Rest = 91%  Patient Saturations on Room Air while Ambulating = 85%  Patient Saturations on 3 Liters of oxygen while Ambulating = 93%  Please briefly explain why patient needs home oxygen: Patient's saturation decreased on RA and 2L, saturations maintained on 3L during ambulation. Patient SOB on exertion.  [name] [name]

## 2015-07-21 NOTE — Care Management (Signed)
[facility] is in network with patient's insurance.  heads up referral called and accepted by [facility].  Patient so far has not qualified for home 02.  Physical continues to recommend skilled nursing and patient continues to decline.

## 2015-07-21 NOTE — Care Management (Signed)
Order present for CM assessment.  Patient admitted with CHF.  She lives alone and is able to perform her adls independently.  Occasionally drives but does have a friend that will help drive her places.  She does not have home 02.  Says her meds "cost a lot."  Discussed being in the new year and initial out of pocket deductibles are in play.  She denies going without her meds.  She uses [facility] in [place] but is in the process of moving all of her scripts to [facility] on [address] in [place].  She currently has some scripts on hold at [facility] that she has not picked up because she was admitted to the hospital.  CM contacted [facility] to inform patient will be obtaining her meds.  She has been evaluated by physical therapy and skilled nursing facility placement was recommended.  Patient declines but is open to home health health services.  She does not have a choice of agencies.  Will need to find agency in network with her insurance.  Discussed during progression the need to assess for need of home 02

## 2015-07-21 NOTE — Progress Notes (Signed)
A&O. Up with assist. Shortness of breath noted with exertion. Patient on RA. Afib with pauses and rate in the 50s on tele. Toprol and pacerone were held during the night after notifying Dr. Jearl Klinefelter patel of telemetry activity. Patient receiving IV lasix.

## 2015-07-21 NOTE — Progress Notes (Signed)
Initial appointment made at the [facility] on August 02, 2015 at 11:00am. Thank you.

## 2015-07-21 NOTE — Progress Notes (Signed)
Hospital Problem List     Active Problems:   CHF (congestive heart failure) (HCC)   Hypertensive heart disease   Chronic diastolic CHF (congestive heart failure) (HCC)   Acute on chronic diastolic CHF (congestive heart failure) (HCC)   Coronary artery disease involving native coronary artery of native heart without angina pectoris   Persistent atrial fibrillation [facility])    Patient Profile:   Primary Cardiologist: Dr. Mariah Milling  81 y/o female w/ PMH of CAD, PAF (on Eliquis), Type 2 DM, HLD, Stage 3 CKD, and chronic diastolic CHF who was admitted on 07/19/2015 due to progressive dyspnea and volume overload.   Subjective   Reports having nausea and vomiting this AM. Relieved with IV Zofran. Reports improvement in her lower extremity edema but still short of breath at rest and with minimal activity. Denies any chest pain or palpitations.  Inpatient Medications    . amiodarone  200 mg Oral BID  . amLODipine  10 mg Oral Daily  . apixaban  5 mg Oral BID  . atorvastatin  20 mg Oral Daily  . ferrous sulfate  325 mg Oral Q breakfast  . furosemide  40 mg Intravenous Q12H  . gabapentin  300 mg Oral TID  . insulin aspart  0-9 Units Subcutaneous TID WC  . insulin glargine  25 Units Subcutaneous Daily  . latanoprost  1 drop Both Eyes QHS  . levothyroxine  150 mcg Oral QAC breakfast  . lisinopril  10 mg Oral Daily  . metoprolol tartrate  25 mg Oral BID  . pantoprazole  40 mg Oral Daily  . potassium chloride SA  20 mEq Oral Daily  . sertraline  50 mg Oral Daily  . sodium chloride flush  3 mL Intravenous Q12H    Vital Signs    Filed Vitals:   07/20/15 1753 07/20/15 1937 07/21/15 0501 07/21/15 1030  BP: 115/64 114/45 116/53 112/48  Pulse: 55 43 85 69  Temp:  97.8 F (36.6 C) 98.1 F (36.7 C)   TempSrc:  Oral Oral   Resp:  22 21   Height:      Weight:      SpO2:  93% 95% 93%    Intake/Output Summary (Last 24 hours) at 07/21/15 1043 Last data filed at 07/21/15 0900  Gross per  24 hour  Intake    720 ml  Output    900 ml  Net   -180 ml   Filed Weights   07/19/15 1321  Weight: 225 lb (102.059 kg)    Physical Exam    General: Pleasant, obese Caucasian female appearing in no acute distress. Head: Normocephalic, atraumatic.  Neck: Supple without bruits, JVD elevated to 9cm. Lungs:  Resp regular and unlabored, rales at bases bilaterally. Heart: Irregularly irregular, S1, S2, no S3, S4, or murmur; no rub. Abdomen: Soft, non-tender, non-distended with normoactive bowel sounds. No hepatomegaly. No rebound/guarding. No obvious abdominal masses. Extremities: No clubbing, cyanosis, trace edema bilaterally up to mid-shins. Distal pedal pulses are 2+ bilaterally. Neuro: Alert and oriented X 3. Moves all extremities spontaneously. Psych: Normal affect.  Labs    CBC  Recent Labs  07/19/15 1316  WBC 7.0  HGB 9.9*  HCT 30.3*  MCV 83.9  PLT 321   Basic Metabolic Panel  Recent Labs  07/20/15 0432 07/21/15 0437  NA 142 143  K 4.2 4.7  CL 110 109  CO2 27 26  GLUCOSE 302* 97  BUN 26* 42*  CREATININE 1.15* 1.68*  CALCIUM  8.8* 8.8*   Cardiac Enzymes  Recent Labs  07/19/15 1316  TROPONINI 0.03    Telemetry    Atrial fibrillation, HR in the mid-50's - 70's.   ECG    No new tracings.   Cardiac Studies and Radiology    Dg Chest 2 View: 07/19/2015  CLINICAL DATA:  Chest pain under the left breast since 07/14/2015. Initial encounter. EXAM: CHEST  2 VIEW COMPARISON:  PA and lateral chest 06/15/2015. Plain film of the chest and right ribs 05/07/2015. FINDINGS: Lung volumes are somewhat low with mild basilar atelectasis. There is cardiomegaly without edema. No pneumothorax or pleural effusion. IMPRESSION: Cardiomegaly without acute disease. Electronically Signed   By: Drusilla Kanner M.D.   On: 07/19/2015 13:47    Echocardiogram: 07/20/2015 Study Conclusions - Left ventricle: The cavity size was normal. Systolic function was normal. The estimated  ejection fraction was in the range of 55% to 65%. Wall motion was normal; there were no regional wall motion abnormalities. The study is not technically sufficient to allow evaluation of LV diastolic function. - Mitral valve: Calcified annulus. There was mild regurgitation. - Left atrium: The atrium was mildly dilated. - Right ventricle: Systolic function was normal. - Right atrium: The atrium was mildly dilated. - Pulmonary arteries: Systolic pressure was mildly elevated. PA peak pressure: 38 mm Hg (S).  Impressions: - Rhythm is atrial fibrillation.  Assessment & Plan    1. Acute on chronic diastolic CHF - presented with a 2-3 month h/o progressive DOE, fatigue, and wt gain in the setting of persistent AFib since December.Had worsening edema despite outpt adjustment of Lasix.  - Her weight is up 25 lbs since mid-December.  - Echo this admission shows preserved EF of 55-65%. - net output so far this admission is - . Unfortunately, daily weights have not been obtained. Will place an order for this. - She was started on IV Lasix 20mg  BID at time of admission and this was advanced to 40mg  BID on 07/20/2015.However, her creatinine trended up from 1.15 to 1.68. Received her morning dose of IV Lasix and reports no urine output since. I will hold her afternoon dose, let reassess her creatinine in the morning, and if improved at that time, restart IV Lasix at decreased frequency or perhaps try PO Torsemide (Dr. 09/19/2015 recommended trying PO Torsemide 20mg  BID at time of discharge).  2. CAD - s/p prior RCA interventions, the last of which was in 2015. She also has known moderate Ramus disease.  - She has not been having angina but notes that her DOE was a significant portion of her prior anginal equivalent.  - Troponin is negative this admission and EKG shows no acute ischemic changes. - Echo this admission shows normal EF with no wall motion abnormalities. Could consider  outpatient NST if her symptoms persist, despite volume reduction and potential DCCV. - Continue BB and Statin. No ASA, as she is on Eliquis. Will hold ACE-I due to elevated creatinine.  3. PAF - She has been in AF since atleast mid-December. At that point, her Amiodarone was increased to 400mg  BID for a week in hopes of chemical cardioversion. She is currently on 200mg  BID.  - Earlier this year, she was reluctant to pursue DCCV as she was hoping to recover from a fx rib on the right first.  - This patients CHA2DS2-VASc Score and unadjusted Ischemic Stroke Rate (% per year) is equal to 12.2 % stroke rate/year from a score of 9 (CHF, HTN, DM,  Vascular, Female, Age (2), CVA (2)). Continue Eliquis. - continue Amiodarone and BB.  - She would be willing to undergo DCCV at  this time if we felt it to be appropriate.Will plan for outpatient DCCV per Dr. Mariah Milling, once her volume status has improved.  4. Hypertensive Heart Disease - Echo this admission shows preserved EF of 55-65%.  - continue current medication regimen.  5. Type II DM - Per admitting team.  6.Acute on Chronic Stage 3 CKD - creatinine 1.10 on admission.  - Elevated to 1.68 on 07/21/2015 in the setting of receiving IV diuresis. - will hold ACE-I.   7. Normocytic Anemia - Hgb was 11.2 two months ago. At 9.9 on 07/19/2015 - per admitting team  Signed, Ellsworth Lennox , PA-C 10:43 AM 07/21/2015 Pager: [phone]

## 2015-07-21 NOTE — Progress Notes (Signed)
Patient cbg this morning 64, orange juice given and cbg reckecked (70). Dr. Cherlynn Kaiser notified and d/c'd metformin. Patient did not c/o any symptoms except "feeling a little shaky". After orange juice no other complaints. Patient received breakfast. Trudee Kuster

## 2015-07-21 NOTE — Progress Notes (Signed)
Physical Therapy Treatment Patient Details Name: [name] [name] MRN: [id number] DOB: 18-Mar-1935 Today's Date: 07/21/2015    History of Present Illness [name] [name] is a 81 y.o. female with a known history of chronic diastolic congestive heart failure, CAD status post stent placement 2014, hypothyroidism, rheumatoid arthritis, type 2 diabetes comes to the emergency room with complaints of increasing shortness of breath and leg swelling and weight gain of 15 pounds. She was seen by primary care physician yesterday and was asked to take extra doses of Lasix. Patient was also ask him to she decided to come today since she didn't have a ride yesterday. Patient was found to be in acute on chronic congestive heart failure diastolic (for further evaluation and management management. She did receive IV Lasix in the emergency room. Pt reports 2 falls in the last 12 months.    PT Comments    Pt with significant anxiety during session today. She eventually agrees to PT treatment after heavy encouragement. Pt ambulates to door and back to bed but becomes very SOB with increase in RR and SaO2 dropping to 89%. However quickly recovers on room air 91%. Pt performs seated exercises at EOB with therapist. During last exercises but becomes very anxious. She starts to become tearful and then becomes nauseated. Pt vomits in trashcan. She requests RN who comes to comfort patient. Cardiologist also comes in room and briefly assess patient. Pt is very limited at this time due to weakness and DOE. She is unable to tolerate more than limited ambulation in room. Pt has stairs to enter home and lives alone. Recommend SNF placement however she continues to refuse. Pt will benefit from skilled PT services to address deficits in strength, balance, and mobility in order to return to full function at home.    Follow Up Recommendations  SNF (Pt refusing)     Equipment Recommendations  None recommended by PT    Recommendations  for Other Services       Precautions / Restrictions Precautions Precautions: Fall Restrictions Weight Bearing Restrictions: No    Mobility  Bed Mobility Overal bed mobility: Needs Assistance Bed Mobility: Supine to Sit     Supine to sit: Min guard     General bed mobility comments: HOB elevated with bed rails. Improved strength and sequencing today but pt takes extended time to perform due to weakness. Once upright pt reports DOE.   Transfers Overall transfer level: Needs assistance Equipment used: Rolling walker (2 wheeled) Transfers: Sit to/from Stand Sit to Stand: Min guard         General transfer comment: Pt takes increased time to come to standing as well as 2 attempts. Once standing pt is steady with use of rolling walker.  Ambulation/Gait Ambulation/Gait assistance: Min guard Ambulation Distance (Feet): 20 Feet Assistive device: Rolling walker (2 wheeled) Gait Pattern/deviations: Decreased step length - right;Decreased step length - left Gait velocity: Decreased Gait velocity interpretation: <1.8 ft/sec, indicative of risk for recurrent falls General Gait Details: Pt ambulates slowly with increased anxiety. She is able to ambulate to door and back to bed. Pt with significant DOE. SaO2 drops to 89% on room air but quickly rebounds to 91% in sitting. RR increases to 27 breaths/minute. HR remains between 60-85. Pt is unsteady with ambulation and unable to progress further at this time.   Stairs            Wheelchair Mobility    Modified Rankin (Stroke Patients Only)  Balance Overall balance assessment: Needs assistance Sitting-balance support: No upper extremity supported Sitting balance-Leahy Scale: Fair     Standing balance support: Bilateral upper extremity supported Standing balance-Leahy Scale: Poor                      Cognition Arousal/Alertness: Awake/alert Behavior During Therapy: WFL for tasks assessed/performed Overall  Cognitive Status: Within Functional Limits for tasks assessed                      Exercises General Exercises - Lower Extremity Long Arc Quad: Strengthening;Both;10 reps;Seated Heel Slides: Strengthening;Both;10 reps;Seated Hip ABduction/ADduction: Strengthening;Both;10 reps;Seated Hip Flexion/Marching: Strengthening;Both;10 reps;Seated Heel Raises: Strengthening;Both;10 reps;Seated    General Comments        Pertinent Vitals/Pain Pain Assessment: No/denies pain    Home Living                      Prior Function            PT Goals (current goals can now be found in the care plan section) Acute Rehab PT Goals Patient Stated Goal: "I'm going home" PT Goal Formulation: With patient Time For Goal Achievement: 08/03/15 Potential to Achieve Goals: Fair Progress towards PT goals: Progressing toward goals    Frequency  Min 2X/week    PT Plan Current plan remains appropriate    Co-evaluation             End of Session Equipment Utilized During Treatment: Gait belt Activity Tolerance: Patient limited by fatigue Patient left: with bed alarm set;in bed;with nursing/sitter in room;with call bell/phone within reach     Time: 0915-0942 PT Time Calculation (min) (ACUTE ONLY): 27 min  Charges:  $Gait Training: 8-22 mins $Therapeutic Exercise: 8-22 mins                    G Codes:      Sharalyn Ink Zafir Schauer PT, DPT   Veronique Warga 07/21/2015, 11:28 AM

## 2015-07-21 NOTE — Progress Notes (Signed)
Per [name] PA hold lisinopril and amlodipine this am. Patient Blood pressure 112/48, receiving IV lasix. [name] [name]

## 2015-07-21 NOTE — Care Management Important Message (Signed)
Important Message  Patient Details  Name: [name] [name] MRN: [id number] Date of Birth: [birth year]   Medicare Important Message Given:  Yes    Olegario Messier A Lakera Viall 07/21/2015, 10:06 AM

## 2015-07-22 LAB — GLUCOSE, CAPILLARY
GLUCOSE-CAPILLARY: 326 mg/dL — AB (ref 65–99)
GLUCOSE-CAPILLARY: 175 mg/dL — AB (ref 65–99)
Glucose-Capillary: 147 mg/dL — ABNORMAL HIGH (ref 65–99)
Glucose-Capillary: 124 mg/dL — ABNORMAL HIGH (ref 65–99)

## 2015-07-22 LAB — BASIC METABOLIC PANEL
ANION GAP: 6 (ref 5–15)
BUN: 44 mg/dL — ABNORMAL HIGH (ref 6–20)
CHLORIDE: 102 mmol/L (ref 101–111)
CO2: 28 mmol/L (ref 22–32)
Calcium: 8.6 mg/dL — ABNORMAL LOW (ref 8.9–10.3)
Creatinine, Ser: 1.51 mg/dL — ABNORMAL HIGH (ref 0.44–1.00)
GFR calc non Af Amer: 32 mL/min — ABNORMAL LOW (ref >60)
GFR, EST AFRICAN AMERICAN: 37 mL/min — AB (ref >60)
Glucose, Bld: 154 mg/dL — ABNORMAL HIGH (ref 65–99)
POTASSIUM: 4.7 mmol/L (ref 3.5–5.1)
Sodium: 136 mmol/L (ref 135–145)

## 2015-07-22 MED ORDER — GUAIFENESIN 100 MG/5ML PO SOLN
5.0000 mL | ORAL | Status: DC | PRN
  Administered 2015-07-22: 100 mg via ORAL
  Filled 2015-07-22: qty 10

## 2015-07-22 MED ORDER — AMIODARONE HCL 200 MG PO TABS
200.0000 mg | ORAL_TABLET | Freq: Every day | ORAL | Status: DC
  Administered 2015-07-22 – 2015-07-27 (×6): 200 mg via ORAL
  Filled 2015-07-22 (×6): qty 1

## 2015-07-22 NOTE — Progress Notes (Signed)
3 L of oxygen. A fib. Fs are stable. Pt takes meds ok. A & O. No pain. 1 assist to BR. Pt has no further concerns at this time.

## 2015-07-22 NOTE — Progress Notes (Signed)
Patient ID: [name] [name], female   DOB: 04-Aug-1934, 81 y.o.   MRN: [id number] [facility] PROGRESS NOTE  ANGLIA BLAKLEY WUJ:[id number] DOB: [birth year] DOA: 07/19/2015 PCP: [name], MD  HPI/Subjective: Patient states she still feels a little short of breath still little bit of a cough. The patient has been having numerous 2 second pauses and this morning and overnight. Patient states she walked to the bathroom.  Objective: Filed Vitals:   07/21/15 1932 07/22/15 0528  BP: 132/72 123/81  Pulse: 50 63  Temp: 98.3 F (36.8 C) 98 F (36.7 C)  Resp: 20 22    Filed Weights   07/19/15 1321 07/21/15 1100 07/22/15 0528  Weight: 102.059 kg (225 lb) 101.424 kg (223 lb 9.6 oz) 101.47 kg (223 lb 11.2 oz)    ROS: Review of Systems  Constitutional: Negative for fever and chills.  Eyes: Negative for blurred vision.  Respiratory: Positive for cough and shortness of breath.   Cardiovascular: Negative for chest pain.  Gastrointestinal: Negative for nausea, vomiting, abdominal pain, diarrhea and constipation.  Genitourinary: Negative for dysuria.  Musculoskeletal: Negative for joint pain.  Neurological: Negative for dizziness and headaches.   Exam: Physical Exam  Constitutional: She is oriented to person, place, and time.  HENT:  Nose: No mucosal edema.  Mouth/Throat: No oropharyngeal exudate or posterior oropharyngeal edema.  Eyes: Conjunctivae, EOM and lids are normal. Pupils are equal, round, and reactive to light.  Neck: No JVD present. Carotid bruit is not present. No edema present. No thyroid mass and no thyromegaly present.  Cardiovascular: S1 normal and S2 normal.  An irregularly irregular rhythm present. Bradycardia present.  Exam reveals no gallop.   No murmur heard. Pulses:      Dorsalis pedis pulses are 2+ on the right side, and 2+ on the left side.  Respiratory: No respiratory distress. She has no wheezes. She has rhonchi in the right lower field  and the left lower field. She has no rales.  GI: Soft. Bowel sounds are normal. There is generalized tenderness.  Musculoskeletal:       Right ankle: She exhibits swelling.       Left ankle: She exhibits swelling.  Lymphadenopathy:    She has no cervical adenopathy.  Neurological: She is alert and oriented to person, place, and time. No cranial nerve deficit.  Skin: Skin is warm. No rash noted. Nails show no clubbing.  Psychiatric: She has a normal mood and affect.      Data Reviewed: Basic Metabolic Panel:  Recent Labs Lab 07/19/15 1316 07/20/15 0432 07/21/15 0437 07/22/15 0629  NA 140 142 143 136  K 3.5 4.2 4.7 4.7  CL 108 110 109 102  CO2 24 27 26 28   GLUCOSE 220* 302* 97 154*  BUN 20 26* 42* 44*  CREATININE 1.10* 1.15* 1.68* 1.51*  CALCIUM 9.1 8.8* 8.8* 8.6*   CBC:  Recent Labs Lab 07/19/15 1316  WBC 7.0  HGB 9.9*  HCT 30.3*  MCV 83.9  PLT 321   Cardiac Enzymes:  Recent Labs Lab 07/19/15 1316  TROPONINI 0.03   BNP (last 3 results)  Recent Labs  07/19/15 1316  BNP 316.0*    ProBNP (last 3 results)  Recent Labs  06/15/15 1100 07/18/15 1429  PROBNP 391.0* 321.0*    CBG:  Recent Labs Lab 07/21/15 1029 07/21/15 1153 07/21/15 1659 07/21/15 2124 07/22/15 0729  GLUCAP 108* 148* 191* 180* 147*    Scheduled Meds: . amiodarone  200 mg Oral  Daily  . amLODipine  10 mg Oral Daily  . apixaban  5 mg Oral BID  . atorvastatin  20 mg Oral Daily  . ferrous sulfate  325 mg Oral Q breakfast  . gabapentin  300 mg Oral TID  . insulin aspart  0-9 Units Subcutaneous TID WC  . insulin glargine  25 Units Subcutaneous Daily  . latanoprost  1 drop Both Eyes QHS  . levothyroxine  150 mcg Oral QAC breakfast  . pantoprazole  40 mg Oral Daily  . potassium chloride SA  20 mEq Oral Daily  . sertraline  50 mg Oral Daily  . sodium chloride flush  3 mL Intravenous Q12H    Assessment/Plan:  1. Bradycardia with pauses. Numerous pauses seen on telemetry  monitoring overnight and this morning of 2.5 seconds at maximum. Hold metoprolol. Decrease amiodarone to 200 mg daily. Monitor today on telemetry. Case discussed with cardiology to see the patient. 2. Acute on chronic diastolic congestive heart failure. Hold Lasix with over diuresis. Hold metoprolol with bradycardia. 3. Chronic atrial fibrillation. Now bradycardic. DC metoprolol. Lower dose of amiodarone to 200 mg daily. Continue eliquis for anticoagulation. 4. Acute renal failure with over diuresis. Hold Lasix at this point. 5. Type 2 diabetes without complication. Hold metformin. Continue Lantus 6. Hypothyroidism unspecified continue Synthroid 7. Glaucoma continue Xalatan eyedrops 8. Depression continue Zoloft 9. Gastroesophageal reflux disease without esophagitis continue Protonix 10. Anxiety. When necessary Xanax 11. Weakness- patient refused rehabilitation and wants to go home 12. Acute respiratory failure with hypoxia- patient qualifies for home oxygen with ambulation saturations in the 80s.  Code Status:     Code Status Orders        Start     Ordered   07/19/15 1737  Full code   Continuous     07/19/15 1736    Code Status History    Date Active Date Inactive Code Status Order ID Comments User Context   06/24/2014 10:17 PM 06/26/2014  2:41 PM Full Code [id number]  Yaakov Guthrie, MD ED   10/21/2013  4:33 PM 10/27/2013  9:15 PM Full Code [id number]  Jeralyn Bennett, MD Inpatient     Disposition Plan: patient will go home with home health, potentially  Tomorrow versus Monday depending on heart rate  Consultants:  cardiology  Time spent: 25 minutes  Alford Highland  [facility]

## 2015-07-22 NOTE — Progress Notes (Signed)
SUBJECTIVE: Pt still having dyspnea.   Tele: atrial fib Review by me of tele shows atrial fib with 2.3 second pauses  BP 123/81 mmHg  Pulse 63  Temp(Src) 98 F (36.7 C) (Oral)  Resp 22  Ht 5\' 2"  (1.575 m)  Wt 223 lb 11.2 oz (101.47 kg)  BMI 40.91 kg/m2  SpO2 94%  Intake/Output Summary (Last 24 hours) at 07/22/15 0837 Last data filed at 07/21/15 1700  Gross per 24 hour  Intake    720 ml  Output      0 ml  Net    720 ml    PHYSICAL EXAM General: Well developed, well nourished, in no acute distress. Alert and oriented x 3.  Psych:  Good affect, responds appropriately Neck: No JVD. No masses noted.  Lungs: Clear bilaterally with no wheezes or rhonci noted.  Heart: irreg irreg with no murmurs noted. Abdomen: Bowel sounds are present. Soft, non-tender.  Extremities: No lower extremity edema.   LABS: Basic Metabolic Panel:  Recent Labs  09/20/15 0437 07/22/15 0629  NA 143 136  K 4.7 4.7  CL 109 102  CO2 26 28  GLUCOSE 97 154*  BUN 42* 44*  CREATININE 1.68* 1.51*  CALCIUM 8.8* 8.6*   CBC:  Recent Labs  07/19/15 1316  WBC 7.0  HGB 9.9*  HCT 30.3*  MCV 83.9  PLT 321   Cardiac Enzymes:  Recent Labs  07/19/15 1316  TROPONINI 0.03   Current Meds: . amiodarone  200 mg Oral Daily  . amLODipine  10 mg Oral Daily  . apixaban  5 mg Oral BID  . atorvastatin  20 mg Oral Daily  . ferrous sulfate  325 mg Oral Q breakfast  . gabapentin  300 mg Oral TID  . insulin aspart  0-9 Units Subcutaneous TID WC  . insulin glargine  25 Units Subcutaneous Daily  . latanoprost  1 drop Both Eyes QHS  . levothyroxine  150 mcg Oral QAC breakfast  . pantoprazole  40 mg Oral Daily  . potassium chloride SA  20 mEq Oral Daily  . sertraline  50 mg Oral Daily  . sodium chloride flush  3 mL Intravenous Q12H     ASSESSMENT AND PLAN:  1. Acute on chronic diastolic CHF: Presented with a 2-3 month h/o progressive DOE, fatigue, and weightt gain in the setting of persistent  Atrial fibrillation since December 2016.Echo this admission shows preserved EF of 55-65%.She is down 1 liter since admission and appears to be relatively euvolemic. She was started on IV Lasix 20mg  BID at time of admission and this was advanced to 40mg  BID on 07/20/2015.However, her creatinine trended up from 1.15 to 1.68 on 07/21/15. Lasix is on hold today. Renal function is slightly improved. Would start torsemide 20 mg po BID.   2. CAD: s/p prior RCA interventions, the last of which was in 2015. She also has known moderate Ramus disease.She is having no chest pain. Troponin is negative this admission and EKG shows no acute ischemic changes. Echo this admission shows normal EF with no wall motion abnormalities. Continue statin. Will hold beta blocker today with pauses. No ASA, as she is on Eliquis. Ace-inh on hold due to elevated creatinine.  3. Atrial fibrillation, persistent: She has been in Atrial fib since atleast mid-December. She has been on metoprolol and amiodarone this admission. She is currently on amiodarone 200mg  BID and metoprolol. She is now having pauses with bradycardia. Will reduce amiodarone 200 mg daily  and stop metoprolol. This patients CHA2DS2-VASc Score is 9. Continue Amiodarone at 200 mg po daily. Will plan for outpatient DCCV per Dr. Mariah Milling, once her volume status has improved.  [name] [name]  3/4/20178:37 AM

## 2015-07-23 LAB — CBC
HCT: 27.2 % — ABNORMAL LOW (ref 35.0–47.0)
Hemoglobin: 9 g/dL — ABNORMAL LOW (ref 12.0–16.0)
MCH: 27.9 pg (ref 26.0–34.0)
MCHC: 33 g/dL (ref 32.0–36.0)
MCV: 84.6 fL (ref 80.0–100.0)
PLATELETS: 250 10*3/uL (ref 150–440)
RBC: 3.21 MIL/uL — ABNORMAL LOW (ref 3.80–5.20)
RDW: 17.6 % — AB (ref 11.5–14.5)
WBC: 6.7 10*3/uL (ref 3.6–11.0)

## 2015-07-23 LAB — GLUCOSE, CAPILLARY
GLUCOSE-CAPILLARY: 280 mg/dL — AB (ref 65–99)
Glucose-Capillary: 109 mg/dL — ABNORMAL HIGH (ref 65–99)
Glucose-Capillary: 175 mg/dL — ABNORMAL HIGH (ref 65–99)
Glucose-Capillary: 211 mg/dL — ABNORMAL HIGH (ref 65–99)

## 2015-07-23 LAB — BASIC METABOLIC PANEL
Anion gap: 7 (ref 5–15)
BUN: 41 mg/dL — AB (ref 6–20)
CO2: 27 mmol/L (ref 22–32)
CREATININE: 1.28 mg/dL — AB (ref 0.44–1.00)
Calcium: 8.8 mg/dL — ABNORMAL LOW (ref 8.9–10.3)
Chloride: 105 mmol/L (ref 101–111)
GFR calc Af Amer: 45 mL/min — ABNORMAL LOW (ref >60)
GFR, EST NON AFRICAN AMERICAN: 39 mL/min — AB (ref >60)
GLUCOSE: 113 mg/dL — AB (ref 65–99)
Potassium: 4.5 mmol/L (ref 3.5–5.1)
SODIUM: 139 mmol/L (ref 135–145)

## 2015-07-23 NOTE — Progress Notes (Signed)
Patient ID: [name] [name], female   DOB: [birth year], 81 y.o.   MRN: [id number] [facility] PROGRESS NOTE  [name] [name]:[id number] DOB: [birth year] DOA: 07/19/2015 PCP: [name] Dove, MD  HPI/Subjective: Patient feels miserable. Some cough and shortness of breath. Still very weak.  Objective: Filed Vitals:   07/23/15 0502 07/23/15 1159  BP: 127/60 141/87  Pulse: 76 70  Temp: 97.8 F (36.6 C) 98.4 F (36.9 C)  Resp: 18 20    Filed Weights   07/21/15 1100 07/22/15 0528 07/23/15 0502  Weight: 101.424 kg (223 lb 9.6 oz) 101.47 kg (223 lb 11.2 oz) 101.787 kg (224 lb 6.4 oz)    ROS: Review of Systems  Constitutional: Negative for fever and chills.  Eyes: Negative for blurred vision.  Respiratory: Positive for cough and shortness of breath.   Cardiovascular: Negative for chest pain.  Gastrointestinal: Negative for nausea, vomiting, abdominal pain, diarrhea and constipation.  Genitourinary: Negative for dysuria.  Musculoskeletal: Negative for joint pain.  Neurological: Positive for weakness. Negative for dizziness and headaches.   Exam: Physical Exam  Constitutional: She is oriented to person, place, and time.  HENT:  Nose: No mucosal edema.  Mouth/Throat: No oropharyngeal exudate or posterior oropharyngeal edema.  Eyes: Conjunctivae, EOM and lids are normal. Pupils are equal, round, and reactive to light.  Neck: No JVD present. Carotid bruit is not present. No edema present. No thyroid mass and no thyromegaly present.  Cardiovascular: S1 normal and S2 normal.  An irregularly irregular rhythm present. Bradycardia present.  Exam reveals no gallop.   No murmur heard. Pulses:      Dorsalis pedis pulses are 2+ on the right side, and 2+ on the left side.  Respiratory: No respiratory distress. She has no wheezes. She has no rhonchi. She has rales in the right lower field and the left lower field.  GI: Soft. Bowel sounds are normal. There is generalized  tenderness.  Musculoskeletal:       Right ankle: She exhibits swelling.       Left ankle: She exhibits swelling.  Lymphadenopathy:    She has no cervical adenopathy.  Neurological: She is alert and oriented to person, place, and time. No cranial nerve deficit.  Skin: Skin is warm. No rash noted. Nails show no clubbing.  Psychiatric: She has a normal mood and affect.      Data Reviewed: Basic Metabolic Panel:  Recent Labs Lab 07/19/15 1316 07/20/15 0432 07/21/15 0437 07/22/15 0629 07/23/15 0453  NA 140 142 143 136 139  K 3.5 4.2 4.7 4.7 4.5  CL 108 110 109 102 105  CO2 24 27 26 28 27   GLUCOSE 220* 302* 97 154* 113*  BUN 20 26* 42* 44* 41*  CREATININE 1.10* 1.15* 1.68* 1.51* 1.28*  CALCIUM 9.1 8.8* 8.8* 8.6* 8.8*   CBC:  Recent Labs Lab 07/19/15 1316 07/23/15 0453  WBC 7.0 6.7  HGB 9.9* 9.0*  HCT 30.3* 27.2*  MCV 83.9 84.6  PLT 321 250   Cardiac Enzymes:  Recent Labs Lab 07/19/15 1316  TROPONINI 0.03   BNP (last 3 results)  Recent Labs  07/19/15 1316  BNP 316.0*    ProBNP (last 3 results)  Recent Labs  06/15/15 1100 07/18/15 1429  PROBNP 391.0* 321.0*    CBG:  Recent Labs Lab 07/22/15 1126 07/22/15 1638 07/22/15 2103 07/23/15 0759 07/23/15 1158  GLUCAP 326* 175* 124* 109* 280*    Scheduled Meds: . amiodarone  200 mg Oral Daily  .  amLODipine  10 mg Oral Daily  . apixaban  5 mg Oral BID  . atorvastatin  20 mg Oral Daily  . ferrous sulfate  325 mg Oral Q breakfast  . gabapentin  300 mg Oral TID  . insulin aspart  0-9 Units Subcutaneous TID WC  . insulin glargine  25 Units Subcutaneous Daily  . latanoprost  1 drop Both Eyes QHS  . levothyroxine  150 mcg Oral QAC breakfast  . pantoprazole  40 mg Oral Daily  . sertraline  50 mg Oral Daily  . sodium chloride flush  3 mL Intravenous Q12H    Assessment/Plan:  1. Bradycardia with pauses. Numerous pauses seen on telemetry monitoring overnight and this morning of 2.5 seconds at  maximum. Holding metoprolol. Decreased amiodarone to 200 mg daily. Patient may end up needing a cardioversion for atrial fibrillation. Patient may end up needing a pacemaker for tachybradycardia syndrome. 2. Acute on chronic diastolic congestive heart failure. Restarted oral torsemide today Hold metoprolol with bradycardia. 3. Chronic atrial fibrillation. Now bradycardic. Off metoprolol. On Lower dose of amiodarone to 200 mg daily. Continue eliquis for anticoagulation. Cardiology considering cardioversion tomorrow. 4. Acute renal failure with over diuresis. Creatinine improved today down to 1.28. Restart oral torsemide 5. Type 2 diabetes without complication. Hold metformin. Continue Lantus 6. Hypothyroidism unspecified continue Synthroid 7. Glaucoma continue Xalatan eyedrops 8. Depression continue Zoloft 9. Gastroesophageal reflux disease without esophagitis continue Protonix 10. Anxiety. When necessary Xanax 11. Weakness- patient refused rehabilitation and wants to go home 12. Acute respiratory failure with hypoxia- patient qualifies for home oxygen with ambulation saturations in the 80s.  Code Status:     Code Status Orders        Start     Ordered   07/19/15 1737  Full code   Continuous     07/19/15 1736    Code Status History    Date Active Date Inactive Code Status Order ID Comments User Context   06/24/2014 10:17 PM 06/26/2014  2:41 PM Full Code [id number]  Yaakov Guthrie, MD ED   10/21/2013  4:33 PM 10/27/2013  9:15 PM Full Code [id number]  Jeralyn Bennett, MD Inpatient     Disposition Plan: patient will go home with home health. We will have to figure out whether or not the patient will need a pacemaker first.  Consultants:  cardiology  Time spent: 25 minutes  Alford Highland  [facility]

## 2015-07-23 NOTE — Progress Notes (Signed)
Spoke with dr. Renae Gloss regarding pauses and heart rate of high 50's- mid 60's. No magnesium checked on patient. Per md no new orders, speak with cardiologist. Cardiologist is on the the floor, aware of heart rate and pause per md give scheduled does of amiodarone. Will continue to monitor

## 2015-07-23 NOTE — Progress Notes (Signed)
SUBJECTIVE: no complaints. More energy today. No dyspnea. No pain  Tele: atrial fib, rate 60s with pauses noted on tele review  BP 127/60 mmHg  Pulse 76  Temp(Src) 97.8 F (36.6 C) (Oral)  Resp 18  Ht 5\' 2"  (1.575 m)  Wt 224 lb 6.4 oz (101.787 kg)  BMI 41.03 kg/m2  SpO2 95%  Intake/Output Summary (Last 24 hours) at 07/23/15 0945 Last data filed at 07/23/15 09/22/15  Gross per 24 hour  Intake    720 ml  Output   1200 ml  Net   -480 ml    PHYSICAL EXAM General: Well developed, well nourished, in no acute distress. Alert and oriented x 3.  Psych:  Good affect, responds appropriately Neck: No JVD. No masses noted.  Lungs: Clear bilaterally with no wheezes or rhonci noted.  Heart: irreg irreg with no murmurs noted. Abdomen: Bowel sounds are present. Soft, non-tender.  Extremities: No lower extremity edema.   LABS: Basic Metabolic Panel:  Recent Labs  3536 0629 07/23/15 0453  NA 136 139  K 4.7 4.5  CL 102 105  CO2 28 27  GLUCOSE 154* 113*  BUN 44* 41*  CREATININE 1.51* 1.28*  CALCIUM 8.6* 8.8*   CBC:  Recent Labs  07/23/15 0453  WBC 6.7  HGB 9.0*  HCT 27.2*  MCV 84.6  PLT 250   Current Meds: . amiodarone  200 mg Oral Daily  . amLODipine  10 mg Oral Daily  . apixaban  5 mg Oral BID  . atorvastatin  20 mg Oral Daily  . ferrous sulfate  325 mg Oral Q breakfast  . gabapentin  300 mg Oral TID  . insulin aspart  0-9 Units Subcutaneous TID WC  . insulin glargine  25 Units Subcutaneous Daily  . latanoprost  1 drop Both Eyes QHS  . levothyroxine  150 mcg Oral QAC breakfast  . pantoprazole  40 mg Oral Daily  . sertraline  50 mg Oral Daily  . sodium chloride flush  3 mL Intravenous Q12H    ASSESSMENT AND PLAN:  1. Acute on chronic diastolic CHF: Presented with a 2-3 month h/o progressive DOE, fatigue, and weight gain in the setting of persistent atrial fibrillation since December 2016.Echo this admission shows preserved EF of 55-65%.She was  started on IV Lasix 20mg  BID at time of admission and this was advanced to 40mg  BID on 07/20/2015.However, her creatinine trended up from 1.15 to 1.68 on 07/21/15. Lasix held 07/22/15. Renal function is much improved today. She appears to be euvolemic on exam today.  Would start torsemide 20 mg po BID today.   2. CAD: s/p prior RCA interventions, the last of which was in 2015. She also has known moderate Ramus disease.She is having no chest pain. Troponin is negative this admission and EKG shows no acute ischemic changes. Echo this admission shows normal EF with no wall motion abnormalities. Continue statin. Beta blocker on hold with pauses noted on telemetry last 48 hours. No ASA, as she is on Eliquis. Ace-inh on hold due to elevated creatinine.  3. Atrial fibrillation, persistent: She has been in Atrial fib since at least mid-December with prior cardioversion in 2015. She has been on metoprolol and amiodarone this admission. She has had pauses over the last 48 hours and beta blocker was held starting 07/22/15. She is currently on amiodarone 200mg  once daily. This patients CHA2DS2-VASc Score is 9 so will continue Eliquis for anticoagulation. Given the pauses, it may be hard  to rate control her with medical therapy. Since she has been on Eliquis for many months, could attempt DCCV this week before discharge. She has had issues with symptomatic bradycardia in the past and may ultimately require a permanent pacemaker.  Will make NPO at midnight in case we need cardiovert tomorrow.   [name] [name]  3/5/20179:45 AM

## 2015-07-23 NOTE — Progress Notes (Signed)
Spoke with dr. Renae Gloss to make aware per night shift pauses were reported from [facility]. Currently i have not received any calls regarding pauses this am. Will continue to monitor

## 2015-07-24 LAB — GLUCOSE, CAPILLARY
GLUCOSE-CAPILLARY: 162 mg/dL — AB (ref 65–99)
Glucose-Capillary: 218 mg/dL — ABNORMAL HIGH (ref 65–99)
Glucose-Capillary: 210 mg/dL — ABNORMAL HIGH (ref 65–99)

## 2015-07-24 MED ORDER — TORSEMIDE 20 MG PO TABS
20.0000 mg | ORAL_TABLET | Freq: Two times a day (BID) | ORAL | Status: DC
  Administered 2015-07-24 – 2015-07-27 (×7): 20 mg via ORAL
  Filled 2015-07-24 (×7): qty 1

## 2015-07-24 MED ORDER — TRAMADOL HCL 50 MG PO TABS
100.0000 mg | ORAL_TABLET | Freq: Four times a day (QID) | ORAL | Status: DC | PRN
Start: 2015-07-24 — End: 2015-07-27
  Administered 2015-07-25: 100 mg via ORAL
  Filled 2015-07-24: qty 2

## 2015-07-24 MED ORDER — SODIUM CHLORIDE 0.9% FLUSH: 3.0000 mL | INTRAVENOUS | Status: DC | PRN

## 2015-07-24 MED ORDER — INSULIN GLARGINE 100 UNIT/ML ~~LOC~~ SOLN
12.0000 [IU] | Freq: Every day | SUBCUTANEOUS | Status: DC
  Administered 2015-07-25 – 2015-07-26 (×2): 12 [IU] via SUBCUTANEOUS
  Filled 2015-07-24 (×3): qty 0.12

## 2015-07-24 MED ORDER — PANTOPRAZOLE SODIUM 40 MG PO TBEC
40.0000 mg | DELAYED_RELEASE_TABLET | Freq: Two times a day (BID) | ORAL | Status: DC
  Administered 2015-07-24 – 2015-07-27 (×7): 40 mg via ORAL
  Filled 2015-07-24 (×7): qty 1

## 2015-07-24 MED ORDER — SODIUM CHLORIDE 0.9 % IV SOLN: 250.0000 mL | INTRAVENOUS | Status: DC

## 2015-07-24 MED ORDER — SODIUM CHLORIDE 0.9% FLUSH: 3.0000 mL | Freq: Two times a day (BID) | INTRAVENOUS | Status: DC

## 2015-07-24 NOTE — Progress Notes (Signed)
SUBJECTIVE: no complaints. More energy today. No dyspnea. No pain  Tele: atrial fib, rate 60s with continued pauses (<3 seconds) noted on tele review.  She still complains of not feeling well, tired with abdominal pain.   BP 121/43 mmHg  Pulse 90  Temp(Src) 97.6 F (36.4 C) (Oral)  Resp 18  Ht 5\' 2"  (1.575 m)  Wt 221 lb 0.7 oz (100.265 kg)  BMI 40.42 kg/m2  SpO2 92%  Intake/Output Summary (Last 24 hours) at 07/24/15 0804 Last data filed at 07/24/15 0439  Gross per 24 hour  Intake    483 ml  Output    900 ml  Net   -417 ml    PHYSICAL EXAM General: Well developed, well nourished, in no acute distress. Alert and oriented x 3.  Psych:  Good affect, responds appropriately Neck: No JVD. No masses noted.  Lungs:no wheezes or rhonci noted, few bibasilar crackles.  Heart: irreg irreg with no murmurs noted. Abdomen: Bowel sounds are present. Soft, non-tender.  Extremities: No lower extremity edema.   LABS: Basic Metabolic Panel:  Recent Labs  09/23/15 0629 07/23/15 0453  NA 136 139  K 4.7 4.5  CL 102 105  CO2 28 27  GLUCOSE 154* 113*  BUN 44* 41*  CREATININE 1.51* 1.28*  CALCIUM 8.6* 8.8*   CBC:  Recent Labs  07/23/15 0453  WBC 6.7  HGB 9.0*  HCT 27.2*  MCV 84.6  PLT 250   Current Meds: . amiodarone  200 mg Oral Daily  . amLODipine  10 mg Oral Daily  . apixaban  5 mg Oral BID  . atorvastatin  20 mg Oral Daily  . ferrous sulfate  325 mg Oral Q breakfast  . gabapentin  300 mg Oral TID  . insulin aspart  0-9 Units Subcutaneous TID WC  . insulin glargine  12 Units Subcutaneous Daily  . latanoprost  1 drop Both Eyes QHS  . levothyroxine  150 mcg Oral QAC breakfast  . pantoprazole  40 mg Oral BID  . sertraline  50 mg Oral Daily  . sodium chloride flush  3 mL Intravenous Q12H  . torsemide  20 mg Oral BID    ASSESSMENT AND PLAN:  1. Acute on chronic diastolic CHF: Presented with a 2-3 month h/o progressive DOE, fatigue, and weight gain in the  setting of persistent atrial fibrillation since December 2016.Echo this admission shows preserved EF of 55-65%.She was started on IV Lasix 20mg  BID at time of admission and this was advanced to 40mg  BID on 07/20/2015.However, her creatinine trended up from 1.15 to 1.68 on 07/21/15. Lasix held 07/22/15. Renal function is much improved today. She appears to be euvolemic on exam today.  I resumed torsemide 20 mg po BID .   2. CAD: s/p prior RCA interventions, the last of which was in 2015. She also has known moderate Ramus disease.She is having no chest pain. Troponin is negative this admission and EKG shows no acute ischemic changes. Echo this admission shows normal EF with no wall motion abnormalities. Continue statin. Beta blocker on hold with pauses noted on telemetry last 48 hours. No ASA, as she is on Eliquis. Ace-inh on hold due to elevated creatinine.  3. Atrial fibrillation, persistent: She has been in Atrial fib since at least mid-December with prior cardioversion in 2015. She has been on metoprolol and amiodarone this admission. She has had pauses over the last 2-3 days and beta blocker was held starting 07/22/15. She is currently  on amiodarone 200mg  once daily. This patients CHA2DS2-VASc Score is 9 so will continue Eliquis for anticoagulation.  I think it will be helpful to get her back in NSR. Recommend cardioversion today or tomorrow depending on anesthesia availability.  She has on Eliquis without interruption for few months.       3/6/20178:04 AM

## 2015-07-24 NOTE — Progress Notes (Signed)
Patient ID: [name] [name], female   DOB: Dec 24, 1934, 81 y.o.   MRN: [id number] [facility] PROGRESS NOTE  WAYNE BRUNKER VEL:[id number] DOB: 1934/08/03 DOA: 07/19/2015 PCP: [name], MD  HPI/Subjective: Patient feels miserable. Having some epigastric abdominal pain. Poor appetite. Had bowel movement yesterday. Has some shortness of breath and wheezing and cough. Overnight at 1 AM had another 2.04 second pause.  Objective: Filed Vitals:   07/23/15 1950 07/24/15 0408  BP: 124/41 121/43  Pulse: 74 90  Temp: 98.3 F (36.8 C) 97.6 F (36.4 C)  Resp: 16 18    Filed Weights   07/22/15 0528 07/23/15 0502 07/24/15 0408  Weight: 101.47 kg (223 lb 11.2 oz) 101.787 kg (224 lb 6.4 oz) 100.265 kg (221 lb 0.7 oz)    ROS: Review of Systems  Constitutional: Negative for fever and chills.  Eyes: Negative for blurred vision.  Respiratory: Positive for cough, shortness of breath and wheezing.   Cardiovascular: Negative for chest pain.  Gastrointestinal: Positive for abdominal pain. Negative for nausea, vomiting, diarrhea and constipation.  Genitourinary: Negative for dysuria.  Musculoskeletal: Negative for joint pain.  Neurological: Positive for weakness. Negative for dizziness and headaches.   Exam: Physical Exam  Constitutional: She is oriented to person, place, and time.  HENT:  Nose: No mucosal edema.  Mouth/Throat: No oropharyngeal exudate or posterior oropharyngeal edema.  Eyes: Conjunctivae, EOM and lids are normal. Pupils are equal, round, and reactive to light.  Neck: No JVD present. Carotid bruit is not present. No edema present. No thyroid mass and no thyromegaly present.  Cardiovascular: S1 normal and S2 normal.  An irregularly irregular rhythm present. Bradycardia present.  Exam reveals no gallop.   No murmur heard. Pulses:      Dorsalis pedis pulses are 2+ on the right side, and 2+ on the left side.  Respiratory: No respiratory distress. She has  wheezes in the right lower field and the left lower field. She has no rhonchi. She has rales in the right lower field and the left lower field.  GI: Soft. Bowel sounds are normal. There is generalized tenderness.  Musculoskeletal:       Right ankle: She exhibits swelling.       Left ankle: She exhibits swelling.  Lymphadenopathy:    She has no cervical adenopathy.  Neurological: She is alert and oriented to person, place, and time. No cranial nerve deficit.  Skin: Skin is warm. No rash noted. Nails show no clubbing.  Psychiatric: She has a normal mood and affect.      Data Reviewed: Basic Metabolic Panel:  Recent Labs Lab 07/19/15 1316 07/20/15 0432 07/21/15 0437 07/22/15 0629 07/23/15 0453  NA 140 142 143 136 139  K 3.5 4.2 4.7 4.7 4.5  CL 108 110 109 102 105  CO2 24 27 26 28 27   GLUCOSE 220* 302* 97 154* 113*  BUN 20 26* 42* 44* 41*  CREATININE 1.10* 1.15* 1.68* 1.51* 1.28*  CALCIUM 9.1 8.8* 8.8* 8.6* 8.8*   CBC:  Recent Labs Lab 07/19/15 1316 07/23/15 0453  WBC 7.0 6.7  HGB 9.9* 9.0*  HCT 30.3* 27.2*  MCV 83.9 84.6  PLT 321 250   Cardiac Enzymes:  Recent Labs Lab 07/19/15 1316  TROPONINI 0.03   BNP (last 3 results)  Recent Labs  07/19/15 1316  BNP 316.0*    ProBNP (last 3 results)  Recent Labs  06/15/15 1100 07/18/15 1429  PROBNP 391.0* 321.0*    CBG:  Recent Labs  Lab 07/22/15 2103 07/23/15 0759 07/23/15 1158 07/23/15 1623 07/23/15 2108  GLUCAP 124* 109* 280* 175* 211*    Scheduled Meds: . amiodarone  200 mg Oral Daily  . amLODipine  10 mg Oral Daily  . apixaban  5 mg Oral BID  . atorvastatin  20 mg Oral Daily  . ferrous sulfate  325 mg Oral Q breakfast  . gabapentin  300 mg Oral TID  . insulin aspart  0-9 Units Subcutaneous TID WC  . insulin glargine  12 Units Subcutaneous Daily  . latanoprost  1 drop Both Eyes QHS  . levothyroxine  150 mcg Oral QAC breakfast  . pantoprazole  40 mg Oral BID  . sertraline  50 mg Oral  Daily  . sodium chloride flush  3 mL Intravenous Q12H    Assessment/Plan:  1. Bradycardia with pauses. Another pause of 2.04 seconds overnight. Still off metoprolol since Saturday. On decreased dose of amiodarone to 200 mg daily. Case discussed with cardiology today. She is nothing by mouth for possible cardioversion. Patient may end up needing a pacemaker but is on Eliquis. 2. Acute on chronic diastolic congestive heart failure. Restarted oral torsemide yesterday. Hold metoprolol with bradycardia. Some wheeze in the lungs today.  3. Chronic atrial fibrillation. Now bradycardic. Off metoprolol. On Lower dose of amiodarone to 200 mg daily. Continue eliquis for anticoagulation. Cardiology considering cardioversion today. 4. Acute renal failure with over diuresis. Creatinine improved. Restarted oral torsemide 5. Type 2 diabetes without complication. Hold metformin. Continue Lantus half dose this morning since nothing by mouth. 6. Hypothyroidism unspecified continue Synthroid 7. Glaucoma continue Xalatan eyedrops 8. Depression continue Zoloft 9. Gastroesophageal reflux disease without esophagitis continue Protonix 10. Anxiety. When necessary Xanax 11. Weakness- patient refused rehabilitation and wants to go home 12. Abdominal pain and epigastric area. Increase PPI to twice a day. Pain control with tramadol. 13. Acute respiratory failure with hypoxia- patient qualifies for home oxygen with ambulation saturations in the 80s.  Code Status:     Code Status Orders        Start     Ordered   07/19/15 1737  Full code   Continuous     07/19/15 1736    Code Status History    Date Active Date Inactive Code Status Order ID Comments User Context   06/24/2014 10:17 PM 06/26/2014  2:41 PM Full Code [id number]  Yaakov Guthrie, MD ED   10/21/2013  4:33 PM 10/27/2013  9:15 PM Full Code [id number]  Jeralyn Bennett, MD Inpatient     Disposition Plan: patient will go home with home health. We will have to figure  out whether or not the patient will need a pacemaker first.  Consultants:  cardiology  Time spent: 25 minutes  Alford Highland  [facility]

## 2015-07-24 NOTE — Progress Notes (Signed)
We could not do cardioversion today as anesthesia was not available.  The procedure is scheduled for tomorrow at 7:30 am with Dr. Mariah Milling.

## 2015-07-24 NOTE — Progress Notes (Signed)
PT Cancellation Note  Patient Details Name: [name] [name] MRN: [id number] DOB: 10-26-1934   Cancelled Treatment:    Reason Eval/Treat Not Completed: Patient declined, no reason specified. Treatment attempted this morn; pt refuses noting she feels "terrible". States she is weak and tired and complains of abdominal pain. Pt refuses any/all participation with PT today. Re attempt treatment tomorrow.    Kristeen Miss, [place] 07/24/2015, 12:12 PM

## 2015-07-24 NOTE — Care Management Important Message (Signed)
Important Message  Patient Details  Name: [name] [name] MRN: [id number] Date of Birth: Oct 01, 1934   Medicare Important Message Given:  Yes    Olegario Messier A Gareld Obrecht 07/24/2015, 1:33 PM

## 2015-07-25 ENCOUNTER — Ambulatory Visit: Payer: Medicare Other | Admitting: Internal Medicine

## 2015-07-25 ENCOUNTER — Encounter
Admission: EM | Disposition: A | Discharge status: Home Health | Payer: Self-pay | Source: Home / Self Care | Attending: Internal Medicine

## 2015-07-25 ENCOUNTER — Encounter: Payer: Self-pay | Admitting: *Deleted

## 2015-07-25 ENCOUNTER — Inpatient Hospital Stay: Payer: Medicare Other | Admitting: Anesthesiology

## 2015-07-25 HISTORY — PX: ELECTROPHYSIOLOGIC STUDY: SHX172A

## 2015-07-25 LAB — BASIC METABOLIC PANEL
ANION GAP: 2 — AB (ref 5–15)
BUN: 28 mg/dL — AB (ref 6–20)
CALCIUM: 8.2 mg/dL — AB (ref 8.9–10.3)
CO2: 34 mmol/L — ABNORMAL HIGH (ref 22–32)
Chloride: 105 mmol/L (ref 101–111)
Creatinine, Ser: 1.02 mg/dL — ABNORMAL HIGH (ref 0.44–1.00)
GFR calc Af Amer: 59 mL/min — ABNORMAL LOW (ref >60)
GFR, EST NON AFRICAN AMERICAN: 51 mL/min — AB (ref >60)
Glucose, Bld: 127 mg/dL — ABNORMAL HIGH (ref 65–99)
Potassium: 4 mmol/L (ref 3.5–5.1)
SODIUM: 141 mmol/L (ref 135–145)

## 2015-07-25 LAB — HEMOGLOBIN: HEMOGLOBIN: 9 g/dL — AB (ref 12.0–16.0)

## 2015-07-25 LAB — GLUCOSE, CAPILLARY
GLUCOSE-CAPILLARY: 241 mg/dL — AB (ref 65–99)
GLUCOSE-CAPILLARY: 275 mg/dL — AB (ref 65–99)
Glucose-Capillary: 140 mg/dL — ABNORMAL HIGH (ref 65–99)
Glucose-Capillary: 228 mg/dL — ABNORMAL HIGH (ref 65–99)

## 2015-07-25 SURGERY — CARDIOVERSION (CATH LAB)
Anesthesia: General

## 2015-07-25 SURGERY — CARDIOVERSION (CATH LAB)
Anesthesia: Monitor Anesthesia Care

## 2015-07-25 MED ORDER — FENTANYL CITRATE (PF) 100 MCG/2ML IJ SOLN
INTRAMUSCULAR | Status: DC | PRN
  Administered 2015-07-25: 50 ug via INTRAVENOUS

## 2015-07-25 MED ORDER — LIDOCAINE HCL (CARDIAC) 20 MG/ML IV SOLN
INTRAVENOUS | Status: DC | PRN
  Administered 2015-07-25: 20 mg via INTRAVENOUS

## 2015-07-25 MED ORDER — SODIUM CHLORIDE 0.9 % IV SOLN
INTRAVENOUS | Status: DC | PRN
  Administered 2015-07-25 (×2): via INTRAVENOUS

## 2015-07-25 MED ORDER — PROPOFOL 10 MG/ML IV BOLUS
INTRAVENOUS | Status: DC | PRN
  Administered 2015-07-25: 40 mg via INTRAVENOUS

## 2015-07-25 NOTE — Anesthesia Postprocedure Evaluation (Signed)
Anesthesia Post Note  Patient: [name] [name]  Procedure(s) Performed: Procedure(s) (LRB): CARDIOVERSION (N/A)  Patient location during evaluation: Cath Lab Anesthesia Type: General Level of consciousness: awake and alert Pain management: pain level controlled Vital Signs Assessment: post-procedure vital signs reviewed and stable Respiratory status: spontaneous breathing, nonlabored ventilation, respiratory function stable and patient connected to nasal cannula oxygen Cardiovascular status: blood pressure returned to baseline and stable Postop Assessment: no signs of nausea or vomiting Anesthetic complications: no    Last Vitals:  Filed Vitals:   07/25/15 0815 07/25/15 0838  BP: 141/64 133/68  Pulse: 58 60  Temp:  36.6 C  Resp: 17 20    Last Pain:  Filed Vitals:   07/25/15 0838  PainSc: 0-No pain                 Lenard Simmer

## 2015-07-25 NOTE — Progress Notes (Signed)
Patient ID: [name] [name], female   DOB: 01-Mar-1935, 81 y.o.   MRN: [id number] [facility] PROGRESS NOTE  [name] [name] [id number]:[id number] DOB: Sep 29, 1934 DOA: 07/19/2015 PCP: [name], MD  HPI/Subjective: Patient having some chest pain.  Points to a specfic spot on left chest.  Objective: Filed Vitals:   07/25/15 1121 07/25/15 1126  BP:  140/50  Pulse: 58 58  Temp:    Resp:      Filed Weights   07/23/15 0502 07/24/15 0408 07/25/15 0425  Weight: 101.787 kg (224 lb 6.4 oz) 100.265 kg (221 lb 0.7 oz) 101.379 kg (223 lb 8 oz)    ROS: Review of Systems  Constitutional: Negative for fever and chills.  Eyes: Negative for blurred vision.  Respiratory: Positive for shortness of breath. Negative for cough and wheezing.   Cardiovascular: Negative for chest pain.  Gastrointestinal: Positive for abdominal pain. Negative for nausea, vomiting, diarrhea and constipation.  Genitourinary: Negative for dysuria.  Musculoskeletal: Negative for joint pain.  Neurological: Positive for weakness. Negative for dizziness and headaches.   Exam: Physical Exam  Constitutional: She is oriented to person, place, and time.  HENT:  Nose: No mucosal edema.  Mouth/Throat: No oropharyngeal exudate or posterior oropharyngeal edema.  Eyes: Conjunctivae, EOM and lids are normal. Pupils are equal, round, and reactive to light.  Neck: No JVD present. Carotid bruit is not present. No edema present. No thyroid mass and no thyromegaly present.  Cardiovascular: Regular rhythm, S1 normal and S2 normal.  Exam reveals no gallop.   No murmur heard. Pulses:      Dorsalis pedis pulses are 2+ on the right side, and 2+ on the left side.  Respiratory: No respiratory distress. She has no wheezes. She has no rhonchi. She has no rales.  GI: Soft. Bowel sounds are normal. There is generalized tenderness.  Musculoskeletal:       Right ankle: She exhibits swelling.       Left ankle: She exhibits  swelling.  Lymphadenopathy:    She has no cervical adenopathy.  Neurological: She is alert and oriented to person, place, and time. No cranial nerve deficit.  Skin: Skin is warm. No rash noted. Nails show no clubbing.  Psychiatric: She has a normal mood and affect.      Data Reviewed: Basic Metabolic Panel:  Recent Labs Lab 07/20/15 0432 07/21/15 0437 07/22/15 0629 07/23/15 0453 07/25/15 0406  NA 142 143 136 139 141  K 4.2 4.7 4.7 4.5 4.0  CL 110 109 102 105 105  CO2 27 26 28 27  34*  GLUCOSE 302* 97 154* 113* 127*  BUN 26* 42* 44* 41* 28*  CREATININE 1.15* 1.68* 1.51* 1.28* 1.02*  CALCIUM 8.8* 8.8* 8.6* 8.8* 8.2*   CBC:  Recent Labs Lab 07/19/15 1316 07/23/15 0453 07/25/15 0406  WBC 7.0 6.7  --   HGB 9.9* 9.0* 9.0*  HCT 30.3* 27.2*  --   MCV 83.9 84.6  --   PLT 321 250  --    Cardiac Enzymes:  Recent Labs Lab 07/19/15 1316  TROPONINI 0.03   BNP (last 3 results)  Recent Labs  07/19/15 1316  BNP 316.0*    ProBNP (last 3 results)  Recent Labs  06/15/15 1100 07/18/15 1429  PROBNP 391.0* 321.0*    CBG:  Recent Labs Lab 07/24/15 1130 07/24/15 1611 07/24/15 2120 07/25/15 0848 07/25/15 1126  GLUCAP 162* 218* 210* 140* 228*    Scheduled Meds: . amiodarone  200 mg Oral Daily  .  amLODipine  10 mg Oral Daily  . apixaban  5 mg Oral BID  . atorvastatin  20 mg Oral Daily  . ferrous sulfate  325 mg Oral Q breakfast  . gabapentin  300 mg Oral TID  . insulin aspart  0-9 Units Subcutaneous TID WC  . insulin glargine  12 Units Subcutaneous Daily  . latanoprost  1 drop Both Eyes QHS  . levothyroxine  150 mcg Oral QAC breakfast  . pantoprazole  40 mg Oral BID  . sertraline  50 mg Oral Daily  . torsemide  20 mg Oral BID    Assessment/Plan:  1. Bradycardia with pauses. No pauses since cardioversion. Watch today 2. Acute on chronic diastolic congestive heart failure. Continue oral torsemide yesterday. Hold metoprolol with  bradycardia. 3. Chronic atrial fibrillation s/p cardioversion in NSR.  On Lower dose of amiodarone to 200 mg daily. Continue eliquis for anticoagulation. Cardiology considering increasing amiodarone to twice a day 4. Chest pain- likely musculoskeletal- get ekg 5. Acute renal failure with over diuresis. Creatinine normailzed. Restarted oral torsemide 6. Type 2 diabetes without complication. Hold metformin. Continue Lantus 7. Hypothyroidism unspecified continue Synthroid 8. Glaucoma continue Xalatan eyedrops 9. Depression continue Zoloft 10. Gastroesophageal reflux disease without esophagitis continue Protonix 11. Anxiety. When necessary Xanax 12. Weakness- patient refused rehabilitation and wants to go home 13. Abdominal pain and epigastric area. Increase PPI to twice a day. Pain control with tramadol. 14. Acute respiratory failure with hypoxia- check pulse ox room air and with activity  Code Status:     Code Status Orders        Start     Ordered   07/19/15 1737  Full code   Continuous     07/19/15 1736    Code Status History    Date Active Date Inactive Code Status Order ID Comments User Context   06/24/2014 10:17 PM 06/26/2014  2:41 PM Full Code [id number]  Yaakov Guthrie, MD ED   10/21/2013  4:33 PM 10/27/2013  9:15 PM Full Code [id number]  Jeralyn Bennett, MD Inpatient     Disposition Plan: patient will go home with home health. Potentially tomorrow. Left message for daughter  Consultants:  cardiology  Time spent: 25 minutes  Alford Highland  [facility]

## 2015-07-25 NOTE — Anesthesia Postprocedure Evaluation (Addendum)
Anesthesia Post Note  Patient: [name] [name]  Procedure(s) Performed: Procedure(s) (LRB): CARDIOVERSION (N/A)  Patient location during evaluation: PACU Anesthesia Type: General Level of consciousness: awake Pain management: pain level controlled Vital Signs Assessment: post-procedure vital signs reviewed and stable Respiratory status: spontaneous breathing and nonlabored ventilation Cardiovascular status: stable Postop Assessment: no headache Anesthetic complications: no Comments: Post op done by Ledell Peoples, CRNA @ 0800    Last Vitals:  Filed Vitals:   07/25/15 0815 07/25/15 0838  BP: 141/64 133/68  Pulse: 58 60  Temp:  36.6 C  Resp: 17 20    Last Pain:  Filed Vitals:   07/25/15 0838  PainSc: 0-No pain                 Darrol Jump

## 2015-07-25 NOTE — Progress Notes (Signed)
Inpatient Diabetes Program Recommendations  AACE/ADA: New Consensus Statement on Inpatient Glycemic Control (2015)  Target Ranges:  Prepandial:   less than 140 mg/dL      Peak postprandial:   less than 180 mg/dL (1-2 hours)      Critically ill patients:  140 - 180 mg/dL   Review of Glycemic Control  Results for SANNA, PORCARO (MRN [id number]) as of 07/25/2015 14:19  Ref. Range 07/24/2015 11:30 07/24/2015 16:11 07/24/2015 21:20 07/25/2015 08:48 07/25/2015 11:26  Glucose-Capillary Latest Ref Range: 65-99 mg/dL 505 (H) 397 (H) 673 (H) 140 (H) 228 (H)    Diabetes history: Type 2 Outpatient Diabetes medications: Novolog 2-10 units tid, Lantus 50 units qam, Metformin 500mg  bid  Current orders for Inpatient glycemic control: Novolog 0-9 units tid with meals, Lantus 12 units qday  Inpatient Diabetes Program Recommendations: Consider increasing Novolog correction to moderate scale 0-15 units tid (post prandial blood sugars elevated)  , RN, BA, Susette Racer, CDE Diabetes Coordinator Inpatient Diabetes Program  [phone] (Team Pager) [phone] [facility] Office) 07/25/2015 2:22 PM

## 2015-07-25 NOTE — Care Management (Signed)
Barrier to discharge- cardioversion 3/6.  Physical therapy will reattempt therapy today .  Patient had declined snf.    Patient will have to be re qualified for home 02 if she discharges directly home

## 2015-07-25 NOTE — CV Procedure (Signed)
Cardioversion procedure note For atrial fibrillation, persistent.  Procedure Details:  Consent: Risks of procedure as well as the alternatives and risks of each were explained to the (patient/caregiver). Consent for procedure obtained.  Time Out: Verified patient identification, verified procedure, site/side was marked, verified correct patient position, special equipment/implants available, medications/allergies/relevent history reviewed, required imaging and test results available. Performed  Patient placed on cardiac monitor, pulse oximetry, supplemental oxygen as necessary.  Sedation given: propofol IV, by Dr. Karlton Lemon Pacer pads placed anterior and posterior chest.   Cardioverted 1 time(s).  Cardioverted at  150J. Synchronized biphasic Converted to NSR   Evaluation: Findings: Post procedure EKG shows: NSR Complications: None Patient did tolerate procedure well.  Time Spent Directly with the Patient:  45 minutes   Dossie Arbour, M.D., Ph.D.

## 2015-07-25 NOTE — Anesthesia Procedure Notes (Signed)
Date/Time: 07/25/2015 7:40 AM Performed by: Henrietta Hoover Pre-anesthesia Checklist: Patient identified, Emergency Drugs available, Suction available, Patient being monitored and Timeout performed Patient Re-evaluated:Patient Re-evaluated prior to inductionOxygen Delivery Method: Nasal cannula Intubation Type: IV induction

## 2015-07-25 NOTE — Care Management (Signed)
Physical therapy is still recommending skilled nursing placement and patient continues to decline.  Patient was able to participate with physical therapy today but not able to do much more than sit on the side of the bed.   Patient says that her daughter is going to stay with her when she goes home. Firmly informed patient that in present state, family most likely would not be able to meet her physical care needs. Gave CM permission to speak with daughter [name]. [name] says that she is not able to stay with patient, nor physically assist her due to her own disability.  Anglea says that when patient had a previous snf stay "they gave her too much medicine and patient had to be readmitted." Gave the name of the facility and discussed very possible could get bed offer from a different facility.  Will enlist the help of the attending and cardiology to persuade patient.  Patient really likes and trust her cardiologist Mariah Milling).  Will reach out to Dr Mariah Milling to speak with patient.

## 2015-07-25 NOTE — Progress Notes (Signed)
Patient: [name] [name] / Admit Date: 07/19/2015 / Date of Encounter: 07/25/2015, 7:49 AM   Subjective: Cardioversion this Am, successful, No complaints,talked to daughter, she is not ready to have patient live with her yet (having floors redone). Previously patient did not want to go to rehab  Review of Systems: Review of Systems  Constitutional: Positive for malaise/fatigue.  Respiratory: Positive for shortness of breath.   Cardiovascular: Negative.   Gastrointestinal: Negative.   Musculoskeletal: Negative.   Neurological: Negative.   Psychiatric/Behavioral: Negative.   All other systems reviewed and are negative.   Objective: Telemetry: NSR rate 60 bpm Physical Exam: Blood pressure 142/70, pulse 80, temperature 97.8 F (36.6 C), temperature source Oral, resp. rate 14, height 5\' 2"  (1.575 m), weight 223 lb 8 oz (101.379 kg), SpO2 98 %. Body mass index is 40.87 kg/(m^2). General: Well developed, well nourished, in no acute distress. Head: Normocephalic, atraumatic, sclera non-icteric, no xanthomas, nares are without discharge. Neck: Negative for carotid bruits. JVP not elevated. Lungs: Clear bilaterally to auscultation without wheezes, rales, or rhonchi. Breathing is unlabored. Heart: RRR S1 S2 without murmurs, rubs, or gallops.  Abdomen: Soft, non-tender, non-distended with normoactive bowel sounds. No rebound/guarding. Extremities: No clubbing or cyanosis. No edema. Distal pedal pulses are 2+ and equal bilaterally. Neuro: Alert and oriented X 3. Moves all extremities spontaneously. Psych:  Responds to questions appropriately with a normal affect.   Intake/Output Summary (Last 24 hours) at 07/25/15 0749 Last data filed at 07/25/15 0552  Gross per 24 hour  Intake    480 ml  Output   3800 ml  Net  -3320 ml    Inpatient Medications:  . amiodarone  200 mg Oral Daily  . amLODipine  10 mg Oral Daily  . apixaban  5 mg Oral BID  . atorvastatin  20 mg Oral Daily  .  ferrous sulfate  325 mg Oral Q breakfast  . gabapentin  300 mg Oral TID  . insulin aspart  0-9 Units Subcutaneous TID WC  . insulin glargine  12 Units Subcutaneous Daily  . latanoprost  1 drop Both Eyes QHS  . levothyroxine  150 mcg Oral QAC breakfast  . pantoprazole  40 mg Oral BID  . sertraline  50 mg Oral Daily  . torsemide  20 mg Oral BID   Infusions:    Labs:  Recent Labs  07/23/15 0453 07/25/15 0406  NA 139 141  K 4.5 4.0  CL 105 105  CO2 27 34*  GLUCOSE 113* 127*  BUN 41* 28*  CREATININE 1.28* 1.02*  CALCIUM 8.8* 8.2*   No results for input(s): AST, ALT, ALKPHOS, BILITOT, PROT, ALBUMIN in the last 72 hours.  Recent Labs  07/23/15 0453 07/25/15 0406  WBC 6.7  --   HGB 9.0* 9.0*  HCT 27.2*  --   MCV 84.6  --   PLT 250  --    No results for input(s): CKTOTAL, CKMB, TROPONINI in the last 72 hours. Invalid input(s): POCBNP No results for input(s): HGBA1C in the last 72 hours.   Weights: Filed Weights   07/23/15 0502 07/24/15 0408 07/25/15 0425  Weight: 224 lb 6.4 oz (101.787 kg) 221 lb 0.7 oz (100.265 kg) 223 lb 8 oz (101.379 kg)     Radiology/Studies:  Dg Chest 2 View  07/19/2015  CLINICAL DATA:  Chest pain under the left breast since 07/14/2015. Initial encounter. EXAM: CHEST  2 VIEW COMPARISON:  PA and lateral chest 06/15/2015. Plain film of the  chest and right ribs 05/07/2015. FINDINGS: Lung volumes are somewhat low with mild basilar atelectasis. There is cardiomegaly without edema. No pneumothorax or pleural effusion. IMPRESSION: Cardiomegaly without acute disease. Electronically Signed   By: Drusilla Kanner M.D.   On: 07/19/2015 13:47     Assessment and Plan  81 y.o. female   1. Acute on chronic diastolic CHF:   2-3 month h/o progressive DOE, fatigue, and weight gain in the setting of persistent atrial fibrillation   EF of 55-65%. Aggressive torsemide this admission  now on torsemide 20 mg po BID .   2. CAD:  s/p prior RCA interventions,   known moderate Ramus disease.  Echo this admission shows normal EF with no wall motion abnormalities.  Continue statin.  --Beta blocker on hold with pauses noted on telemetry last 48 hours.  No ASA, as she is on Eliquis.  Ace-inh on hold due to elevated creatinine.  3. Atrial fibrillation, persistent:  She has been in Atrial fib since at least mid-December with prior cardioversion in 2015.  Cardioversion this AM, now NSR  beta blocker was held starting 07/22/15 for pauses. on amiodarone 200mg  daily, --- if heart rate allows will increase to BID dosing for 2 weeks post cardioversion  continue Eliquis for anticoagulation. (She has on Eliquis without interruption for few months)  4: anemia: this will contribute to fatigue and SOB Consider anemia workup,  iron studies   Signed, , MD, Ph.D. [facility] 07/25/2015, 7:49 AM

## 2015-07-25 NOTE — Transfer of Care (Signed)
Immediate Anesthesia Transfer of Care Note  Patient: [name] [name]  Procedure(s) Performed: Procedure(s): CARDIOVERSION (N/A)  Patient Location: PACU  Anesthesia Type:General  Level of Consciousness: awake  Airway & Oxygen Therapy: Patient Spontanous Breathing and Patient connected to nasal cannula oxygen  Post-op Assessment: Report given to RN and Post -op Vital signs reviewed and stable  Post vital signs: Reviewed and stable  Last Vitals:  Filed Vitals:   07/25/15 0815 07/25/15 0838  BP: 141/64 133/68  Pulse: 58 60  Temp:  36.6 C  Resp: 17 20    Complications: No apparent anesthesia complications

## 2015-07-25 NOTE — Progress Notes (Signed)
Patient in recovery, alert and oriented. Reporting no pain. Tolerated procedure well and is currently in a sinus brady, HR 57. Post procedure ECG completed and shown to Dr Mariah Milling for review. Vital signs stable. Report called to floor nurse, will transport back to room shortly.

## 2015-07-25 NOTE — Anesthesia Preprocedure Evaluation (Addendum)
Anesthesia Evaluation  Patient identified by MRN, date of birth, ID band Patient awake    Reviewed: Allergy & Precautions, H&P , NPO status , Patient's Chart, lab work & pertinent test results, reviewed documented beta blocker date and time   History of Anesthesia Complications Negative for: history of anesthetic complications  Airway Mallampati: IV  TM Distance: >3 FB Neck ROM: full    Dental no notable dental hx. (+) Edentulous Upper, Missing, Poor Dentition   Pulmonary shortness of breath, asthma , neg sleep apnea, neg COPD, neg recent URI,    Pulmonary exam normal breath sounds clear to auscultation       Cardiovascular Exercise Tolerance: Good hypertension, (-) angina+ CAD, + Cardiac Stents, + Peripheral Vascular Disease and +CHF  (-) Past MI and (-) CABG Normal cardiovascular exam+ dysrhythmias Atrial Fibrillation (-) Valvular Problems/Murmurs Rhythm:regular Rate:Normal     Neuro/Psych neg Seizures PSYCHIATRIC DISORDERS (Depression) CVA, No Residual Symptoms    GI/Hepatic Neg liver ROS, GERD  ,  Endo/Other  diabetesHypothyroidism Morbid obesity  Renal/GU CRFRenal disease  negative genitourinary   Musculoskeletal   Abdominal   Peds  Hematology negative hematology ROS (+)   Anesthesia Other Findings Past Medical History:   DM (diabetes mellitus) (HCC)                                 PAF (paroxysmal atrial fibrillation) (HCC)                     Comment:a. s/p dccv 04/2014;  b. amio/eliquis;  c.               06/2013 bb/ccb d/c 2/2 symptomatic bradycardia;               d. 04/2015 recurrent AF->amio increased/bb               resumed.   Dyslipidemia                                                 CAD (coronary artery disease)                                  Comment:a. 2000 s/p PCI RCA;  b. 2005 PCI of RCA 2/2               ISR; c. 04/2013 Neg MV;  d. 05/2013 Cath: LM 20,              LAD 30p, LCX 20p, OM1 90  small, RCA 8m (PTCA -              FFR 0.86), PDA 40.   Osteoarthritis                                               Hypothyroidism                                                 Comment:hx  Rheumatoid arthritis(714.0)                                  Obesity                                                      GERD (gastroesophageal reflux disease)                       Shoulder fracture, left                                        Comment:a. Dr. Martha Clan   Cerebral infarction [facility])                                    CKD (chronic kidney disease), stage III                      Asthma                                                       Chronic diastolic CHF (congestive heart failur*                Comment:a. 10/2013 Echo: EF 60-65%, no rwma, Gr1 DD,               mild MR, mildly dil LA.   Hypertensive heart disease                                   Right rib fracture                                             Comment:a. 04/2015.   Reproductive/Obstetrics negative OB ROS                             Anesthesia Physical Anesthesia Plan  ASA: IV  Anesthesia Plan: General   Post-op Pain Management:    Induction:   Airway Management Planned:   Additional Equipment:   Intra-op Plan:   Post-operative Plan:   Informed Consent: I have reviewed the patients History and Physical, chart, labs and discussed the procedure including the risks, benefits and alternatives for the proposed anesthesia with the patient or authorized representative who has indicated his/her understanding and acceptance.   Dental Advisory Given  Plan Discussed with: Anesthesiologist, CRNA and Surgeon  Anesthesia Plan Comments:         Anesthesia Quick Evaluation

## 2015-07-25 NOTE — Progress Notes (Signed)
Physical Therapy Treatment Patient Details Name: [name] [name] MRN: [id number] DOB: [birth year] Today's Date: 07/25/2015    History of Present Illness [name] [name] is a 81 y.o. female with a known history of chronic diastolic congestive heart failure, CAD status post stent placement 2014, hypothyroidism, rheumatoid arthritis, type 2 diabetes comes to the emergency room with complaints of increasing shortness of breath and leg swelling and weight gain of 15 pounds. She was seen by primary care physician yesterday and was asked to take extra doses of Lasix. Patient was also ask him to she decided to come today since she didn't have a ride yesterday. Patient was found to be in acute on chronic congestive heart failure diastolic (for further evaluation and management management. She did receive IV Lasix in the emergency room. Pt reports 2 falls in the last 12 months.    PT Comments    Pt reluctant to participate with PT, but with gentle encouragement agreeable to try. Pt participates well with supine bed exercises, although pt notes fatigue. Heart rate increases from 58 to 63 beats per minutes with exercises; O2 saturation 91-92% throughout session. Pt does complains of occasional sharp pain at base of sternum post sitting exercises. Nursing notified and addressing. Pt also notes dizziness with sit that does not subside after 5 minutes; therefore, standing/ambulation deferred. Pt encouraged to continued gentle bed exercises several times a day. Continue PT to progress strength, endurance and out of bed activities for improved functional mobility.   Follow Up Recommendations  SNF     Equipment Recommendations  None recommended by PT    Recommendations for Other Services       Precautions / Restrictions Restrictions Weight Bearing Restrictions: No    Mobility  Bed Mobility Overal bed mobility: Needs Assistance Bed Mobility: Supine to Sit;Sit to Supine     Supine to sit: Min assist;HOB  elevated Sit to supine: Min assist;HOB elevated   General bed mobility comments: Min A for trunk with sit and LEs for supine  Transfers                 General transfer comment: deferred due to dizziness with sit that does not subside with 5 min of sit  Ambulation/Gait                 Stairs            Wheelchair Mobility    Modified Rankin (Stroke Patients Only)       Balance   Sitting-balance support: Bilateral upper extremity supported;Feet unsupported Sitting balance-Leahy Scale: Good Sitting balance - Comments: Complains of dizziness with sit                             Cognition Arousal/Alertness: Awake/alert (Fatigued) Behavior During Therapy: WFL for tasks assessed/performed Overall Cognitive Status: Within Functional Limits for tasks assessed                      Exercises General Exercises - Lower Extremity Ankle Circles/Pumps: AROM;Both;20 reps;Supine Quad Sets: Strengthening;Both;20 reps;Supine Gluteal Sets: Strengthening;Both;20 reps;Supine Short Arc Quad: AROM;Both;20 reps;Supine Long Arc Quad: AROM;Both;20 reps;Seated Heel Slides: AROM;Both;20 reps;Supine Hip ABduction/ADduction: AROM;Both;20 reps;Supine Hip Flexion/Marching: AROM;Both;20 reps;Seated    General Comments        Pertinent Vitals/Pain Pain Assessment:  (C/o pain in at bottom of sternum, intermittent)    Home Living  Prior Function            PT Goals (current goals can now be found in the care plan section) Progress towards PT goals: Progressing toward goals    Frequency  Min 2X/week    PT Plan Current plan remains appropriate    Co-evaluation             End of Session   Activity Tolerance: Patient limited by fatigue       Time: 3235-5732 PT Time Calculation (min) (ACUTE ONLY): 28 min  Charges:  $Therapeutic Exercise: 8-22 mins $Therapeutic Activity: 8-22 mins                    G  Codes:      Kristeen Miss, PTA 07/25/2015, 11:58 AM

## 2015-07-26 ENCOUNTER — Inpatient Hospital Stay: Payer: Medicare Other

## 2015-07-26 LAB — GLUCOSE, CAPILLARY
GLUCOSE-CAPILLARY: 217 mg/dL — AB (ref 65–99)
GLUCOSE-CAPILLARY: 228 mg/dL — AB (ref 65–99)
GLUCOSE-CAPILLARY: 255 mg/dL — AB (ref 65–99)
Glucose-Capillary: 203 mg/dL — ABNORMAL HIGH (ref 65–99)

## 2015-07-26 LAB — CBC
HCT: 28.4 % — ABNORMAL LOW (ref 35.0–47.0)
Hemoglobin: 9.4 g/dL — ABNORMAL LOW (ref 12.0–16.0)
MCH: 27.7 pg (ref 26.0–34.0)
MCHC: 33.2 g/dL (ref 32.0–36.0)
MCV: 83.6 fL (ref 80.0–100.0)
PLATELETS: 276 10*3/uL (ref 150–440)
RBC: 3.4 MIL/uL — AB (ref 3.80–5.20)
RDW: 18.1 % — AB (ref 11.5–14.5)
WBC: 6.4 10*3/uL (ref 3.6–11.0)

## 2015-07-26 MED ORDER — FUROSEMIDE 10 MG/ML IJ SOLN
60.0000 mg | Freq: Once | INTRAMUSCULAR | Status: AC
  Administered 2015-07-26: 60 mg via INTRAVENOUS
  Filled 2015-07-26: qty 6

## 2015-07-26 MED ORDER — BUDESONIDE 0.25 MG/2ML IN SUSP
0.2500 mg | Freq: Two times a day (BID) | RESPIRATORY_TRACT | Status: DC
  Administered 2015-07-26 – 2015-07-27 (×2): 0.25 mg via RESPIRATORY_TRACT
  Filled 2015-07-26 (×2): qty 2

## 2015-07-26 MED ORDER — INSULIN ASPART 100 UNIT/ML ~~LOC~~ SOLN
0.0000 [IU] | Freq: Three times a day (TID) | SUBCUTANEOUS | Status: DC
  Administered 2015-07-26: 8 [IU] via SUBCUTANEOUS
  Administered 2015-07-27: 2 [IU] via SUBCUTANEOUS
  Administered 2015-07-27: 8 [IU] via SUBCUTANEOUS
  Filled 2015-07-26 (×2): qty 8
  Filled 2015-07-26: qty 2

## 2015-07-26 MED ORDER — INSULIN GLARGINE 100 UNIT/ML ~~LOC~~ SOLN
14.0000 [IU] | Freq: Every day | SUBCUTANEOUS | Status: DC
  Administered 2015-07-27: 14 [IU] via SUBCUTANEOUS
  Filled 2015-07-26: qty 0.14

## 2015-07-26 MED ORDER — INSULIN ASPART 100 UNIT/ML ~~LOC~~ SOLN
0.0000 [IU] | Freq: Every day | SUBCUTANEOUS | Status: DC
  Administered 2015-07-26: 2 [IU] via SUBCUTANEOUS
  Filled 2015-07-26: qty 2

## 2015-07-26 NOTE — Progress Notes (Signed)
Patient ID: [name] [name], female   DOB: June 05, 1934, 81 y.o.   MRN: [id number] [facility] PROGRESS NOTE  DEYSY SCHABEL WUJ:[id number] DOB: 1935-04-05 DOA: 07/19/2015 PCP: [name], MD  HPI/Subjective: Patient not feeling well, she was more short of breath overnight and was placed on 4 liters of oxygen  Objective: Filed Vitals:   07/26/15 1045 07/26/15 1120  BP:  136/53  Pulse: 61 59  Temp:  97.8 F (36.6 C)  Resp:  16    Filed Weights   07/24/15 0408 07/25/15 0425 07/26/15 0415  Weight: 100.265 kg (221 lb 0.7 oz) 101.379 kg (223 lb 8 oz) 100.472 kg (221 lb 8 oz)    ROS: Review of Systems  Constitutional: Negative for fever and chills.  Eyes: Negative for blurred vision.  Respiratory: Positive for shortness of breath. Negative for cough and wheezing.   Cardiovascular: Negative for chest pain.  Gastrointestinal: Positive for abdominal pain. Negative for nausea, vomiting, diarrhea and constipation.  Genitourinary: Negative for dysuria.  Musculoskeletal: Negative for joint pain.  Neurological: Positive for weakness. Negative for dizziness and headaches.   Exam: Physical Exam  Constitutional: She is oriented to person, place, and time.  HENT:  Nose: No mucosal edema.  Mouth/Throat: No oropharyngeal exudate or posterior oropharyngeal edema.  Eyes: Conjunctivae, EOM and lids are normal. Pupils are equal, round, and reactive to light.  Neck: No JVD present. Carotid bruit is not present. No edema present. No thyroid mass and no thyromegaly present.  Cardiovascular: Regular rhythm, S1 normal and S2 normal.  Exam reveals no gallop.   No murmur heard. Pulses:      Dorsalis pedis pulses are 2+ on the right side, and 2+ on the left side.  Respiratory: No respiratory distress. She has no wheezes. She has no rhonchi. She has rales in the right lower field and the left lower field.  GI: Soft. Bowel sounds are normal. There is generalized tenderness.   Musculoskeletal:       Right ankle: She exhibits swelling.       Left ankle: She exhibits swelling.  Lymphadenopathy:    She has no cervical adenopathy.  Neurological: She is alert and oriented to person, place, and time. No cranial nerve deficit.  Skin: Skin is warm. No rash noted. Nails show no clubbing.  Psychiatric: She has a normal mood and affect.      Data Reviewed: Basic Metabolic Panel:  Recent Labs Lab 07/20/15 0432 07/21/15 0437 07/22/15 0629 07/23/15 0453 07/25/15 0406  NA 142 143 136 139 141  K 4.2 4.7 4.7 4.5 4.0  CL 110 109 102 105 105  CO2 27 26 28 27  34*  GLUCOSE 302* 97 154* 113* 127*  BUN 26* 42* 44* 41* 28*  CREATININE 1.15* 1.68* 1.51* 1.28* 1.02*  CALCIUM 8.8* 8.8* 8.6* 8.8* 8.2*   CBC:  Recent Labs Lab 07/23/15 0453 07/25/15 0406 07/26/15 1138  WBC 6.7  --  6.4  HGB 9.0* 9.0* 9.4*  HCT 27.2*  --  28.4*  MCV 84.6  --  83.6  PLT 250  --  276   BNP (last 3 results)  Recent Labs  07/19/15 1316  BNP 316.0*    ProBNP (last 3 results)  Recent Labs  06/15/15 1100 07/18/15 1429  PROBNP 391.0* 321.0*    CBG:  Recent Labs Lab 07/25/15 1126 07/25/15 1648 07/25/15 2254 07/26/15 0820 07/26/15 1144  GLUCAP 228* 241* 275* 217* 228*    Scheduled Meds: . amiodarone  200  mg Oral Daily  . amLODipine  10 mg Oral Daily  . apixaban  5 mg Oral BID  . atorvastatin  20 mg Oral Daily  . budesonide (PULMICORT) nebulizer solution  0.25 mg Nebulization BID  . ferrous sulfate  325 mg Oral Q breakfast  . gabapentin  300 mg Oral TID  . insulin aspart  0-15 Units Subcutaneous TID WC  . insulin aspart  0-5 Units Subcutaneous QHS  . [START ON 07/27/2015] insulin glargine  14 Units Subcutaneous Daily  . latanoprost  1 drop Both Eyes QHS  . levothyroxine  150 mcg Oral QAC breakfast  . pantoprazole  40 mg Oral BID  . sertraline  50 mg Oral Daily  . torsemide  20 mg Oral BID    Assessment/Plan:  1. Acute on chronic diastolic congestive heart  failure. Continue oral torsemide. Hold metoprolol with bradycardia. I gave 1 dose of IV Lasix this morning because I heard more no wheezes in the lungs. Chest x-ray read as negative. Likely secondary to atelectasis from not moving around. Budesonide nebulizers ordered. 2. Chronic atrial fibrillation s/p cardioversion in NSR.  On Lower dose of amiodarone to 200 mg daily. Continue eliquis for anticoagulation. Cardiology considering increasing amiodarone to twice a day 3. No further sinus pauses since conversion to normal sinus rhythm 4. Acute renal failure with over diuresis. Creatinine normailzed. Restarted oral torsemide 5. Type 2 diabetes without complication. Hold metformin. Continue Lantus 6. Hypothyroidism unspecified continue Synthroid 7. Glaucoma continue Xalatan eyedrops 8. Depression continue Zoloft 9. Gastroesophageal reflux disease without esophagitis continue Protonix 10. Anxiety. When necessary Xanax 11. Weakness- patient refused rehabilitation and wants to go home 12. Abdominal pain and epigastric area. Increase PPI to twice a day. Pain control with tramadol. 13. Acute respiratory failure with hypoxia- will qualify for oxygen at home.  Code Status:     Code Status Orders        Start     Ordered   07/19/15 1737  Full code   Continuous     07/19/15 1736    Code Status History    Date Active Date Inactive Code Status Order ID Comments User Context   06/24/2014 10:17 PM 06/26/2014  2:41 PM Full Code [id number]  Yaakov Guthrie, MD ED   10/21/2013  4:33 PM 10/27/2013  9:15 PM Full Code [id number]  Jeralyn Bennett, MD Inpatient     Disposition Plan: patient will go home with home health. Potentially tomorrow. Spoke with daughter today  Consultants:  cardiology  Time spent: 22 minutes  Alford Highland  [facility]

## 2015-07-26 NOTE — Progress Notes (Signed)
No further episodes of SOB noted. Patient resting in bed, with eyes closed. Remains on 4L O2, via [place]. Will continue to monitor.

## 2015-07-26 NOTE — Progress Notes (Signed)
Patient woke from sleep feeling SOB, on RA- sats high 80's. Lung sounds were diminished with exp wheezes present. 2L O2, via [place], was applied wit little improvement in sats, 89/90%. Patient appeared to be mouth breathing, venti mask applied 45% with sats 92%. Albuterol SVN administered, per PRN order. After treatment, patient verbalized improvement in breathing, stating "I feel much better". Alprazolam administered for patient anxiety. RT placed patient on [place] @4L , sats 94%. Patient resting in bed, on [place], no signs of respiratory distress. Will continue to monitor.

## 2015-07-26 NOTE — Progress Notes (Signed)
Patient Name: [name] [name] Date of Encounter: 07/26/2015  Hospital Problem List     Principal Problem:   Acute on chronic diastolic CHF (congestive heart failure) (HCC) Active Problems:   Persistent atrial fibrillation (HCC)   Hyperlipidemia   Diabetes mellitus type 2, uncontrolled (HCC)   Hypertensive heart disease   Coronary artery disease involving native coronary artery of native heart without angina pectoris   Hypothyroidism   CKD (chronic kidney disease), stage III    Subjective   S/p DCCV yesterday and maintaining sinus rhythm since.  She c/o dyspnea overnight and was found to be hypoxic in the high 80's.  She was treated with albuterol with improvement.  She was also given a dose of IV lasix 60mg  this AM.  CXR did not show any significant CHF.  Currently, she says she feels well.  No c/p or dyspnea.    Inpatient Medications    . amiodarone  200 mg Oral Daily  . amLODipine  10 mg Oral Daily  . apixaban  5 mg Oral BID  . atorvastatin  20 mg Oral Daily  . ferrous sulfate  325 mg Oral Q breakfast  . gabapentin  300 mg Oral TID  . insulin aspart  0-9 Units Subcutaneous TID WC  . insulin glargine  12 Units Subcutaneous Daily  . latanoprost  1 drop Both Eyes QHS  . levothyroxine  150 mcg Oral QAC breakfast  . pantoprazole  40 mg Oral BID  . sertraline  50 mg Oral Daily  . torsemide  20 mg Oral BID    Vital Signs    Filed Vitals:   07/26/15 0833 07/26/15 0950 07/26/15 1045 07/26/15 1120  BP: 142/69 133/56  136/53  Pulse: 64 56 61 59  Temp:    97.8 F (36.6 C)  TempSrc:    Oral  Resp:    16  Height:      Weight:      SpO2:   97% 93%    Intake/Output Summary (Last 24 hours) at 07/26/15 1128 Last data filed at 07/26/15 0830  Gross per 24 hour  Intake    720 ml  Output   1200 ml  Net   -480 ml   Filed Weights   07/24/15 0408 07/25/15 0425 07/26/15 0415  Weight: 221 lb 0.7 oz (100.265 kg) 223 lb 8 oz (101.379 kg) 221 lb 8 oz (100.472 kg)    Physical  Exam    General: Pleasant, NAD. Neuro: Alert and oriented X 3. Moves all extremities spontaneously. Psych: Normal affect. HEENT:  Normal  Neck: Supple without bruits or JVD. Lungs:  Resp regular and unlabored, bibasilar crackles. Heart: RRR no s3, s4, or murmurs. Abdomen: Soft, non-tender, non-distended, BS + x 4.  Extremities: No clubbing, cyanosis or edema. DP/PT/Radials 2+ and equal bilaterally.  Labs    CBC  Recent Labs  07/25/15 0406  HGB 9.0*   Basic Metabolic Panel  Recent Labs  07/25/15 0406  NA 141  K 4.0  CL 105  CO2 34*  GLUCOSE 127*  BUN 28*  CREATININE 1.02*  CALCIUM 8.2*    Telemetry    RSR.  Brief, sub 3 second pauses yesterday afternoon - asymp.  ECG    3/7 - RSR, 62, non-specific st/t changes.  Radiology    Dg Chest 1 View  07/26/2015  CLINICAL DATA:  Increasing shortness of breath EXAM: CHEST 1 VIEW COMPARISON:  07/19/2015 FINDINGS: Lungs are essentially clear. No focal consolidation. No pleural effusion  or pneumothorax. Cardiomegaly.  Prominence of the main pulmonary artery. IMPRESSION: No evidence of acute cardiopulmonary disease. Electronically Signed   By: Charline Bills M.D.   On: 07/26/2015 09:35   Dg Chest 2 View  07/19/2015  CLINICAL DATA:  Chest pain under the left breast since 07/14/2015. Initial encounter. EXAM: CHEST  2 VIEW COMPARISON:  PA and lateral chest 06/15/2015. Plain film of the chest and right ribs 05/07/2015. FINDINGS: Lung volumes are somewhat low with mild basilar atelectasis. There is cardiomegaly without edema. No pneumothorax or pleural effusion. IMPRESSION: Cardiomegaly without acute disease. Electronically Signed   By: Drusilla Kanner M.D.   On: 07/19/2015 13:47    Assessment & Plan    1.  Acute on chronic diastolic CHF:  2-3 month h/o progressive DOE, fatigue, and weight gain in the setting of persistent atrial fibrillation   EF of 55-65% by echo this admission.  Net negative 5.7L this admission with  reduction in wt by ~ 4 lbs. She was dyspneic and hypoxic in the middle of the night - this improved with albuterol.  CXR did not show CHF. She does have crackles on exam but neck veins are flat.  I suspect crackles represent atelectasis and will ask for an incentive spirometer. - HR/BP stable. -Hopefully will maintain sinus rhythm as I suspect AFib was major contributor to volume overload. -Cont on torsemide 20 mg po BID. Will have to watch renal fxn closely as she may diurese better now that she is in sinus.   2.  CAD:   S/p prior RCA interventions, Known moderate Ramus disease.  Echo this admission shows normal EF with no wall motion abnormalities.   Continue statin.   She has been relatively bradycardic in sinus rhythm and  blocker has been on hold - cont to hold in setting of sub 3 second pauses. No ASA, as she is on Eliquis.   ACEI has been on hold 2/2 elev creat during diuresis. Creat now stable.  Plan to resume w/ outpt bmet f/u.  3.  Atrial fibrillation, persistent:   She has been in Atrial fib since at least mid-December with prior cardioversion in 2015.    S/P DCCV 3/7 - maintaining sinus rhythm - she can't really tell yet if she if feeling any better. -On amiodarone 200mg  daily. HR's have been 50's to low 60's with sub 3 second pauses noted 3/7.  Cont amio @ current dose. -Continue Eliquis for anticoagulation.   4. Anemia: Relatively stable since admission. This will contribute to fatigue and SOB -Consider anemia workup, iron studies.  5. Acute on chronic stage III kidney dzs: Creat stable on torsemide. -Plan to resume ACEI 3/9 if creat stable.  6. Deconditioning: Family and CM considering SNF placement.  Pt has been reluctant but family cannot provide care @ home.  Signed, 5/9 NP   Attending Note Patient seen and examined, agree with detailed note above,  Patient presentation and plan discussed on rounds.   Shortness of breath overnight, improved  after Lasix IV               currently  on torsemide 20 mg twice a day  on clinical exam with some crackles at the bases bilaterally , Improved renal function over the past  Week Maintaining normal sinus rhythm on telemetry  --- Would continue current medications She continues to have shortness of breath, may have to increase torsemide up to 40 mg twice a day Suspect she may need higher dose  at the time of discharge or at least very close follow-up   she will benefit from CHF clinic    Greater than 50% was spent in counseling and coordination of care with patient Total encounter time 25 minutes or more   Signed: Dossie Arbour  M.D., Ph.D. [facility]

## 2015-07-26 NOTE — Progress Notes (Signed)
Inpatient Diabetes Program Recommendations  AACE/ADA: New Consensus Statement on Inpatient Glycemic Control (2015)  Target Ranges:  Prepandial:   less than 140 mg/dL      Peak postprandial:   less than 180 mg/dL (1-2 hours)      Critically ill patients:  140 - 180 mg/dL  Results for DHYANA, BASTONE (MRN [id number]) as of 07/26/2015 12:40  Ref. Range 07/25/2015 08:48 07/25/2015 11:26 07/25/2015 16:48 07/25/2015 22:54 07/26/2015 08:20 07/26/2015 11:44  Glucose-Capillary Latest Ref Range: 65-99 mg/dL 132 (H) 440 (H) 102 (H) 275 (H) 217 (H) 228 (H)   Review of Glycemic Control  Current orders for Inpatient glycemic control: Lantus 12 units daily, Novolog 0-9 units TID with meals  Inpatient Diabetes Program Recommendations: Insulin - Basal: Please consider increasing Lantus to 14 units daily. Correction (SSI): Please consider ordering Novolog bedtime correction scale. Insulin - Meal Coverage: Please order Novolog 3 units TID with meals for meal coverage (in addition to Novolog correction scale) if patient is eats at least 50% of meal.  Thanks, Orlando Penner, RN, MSN, CDE Diabetes Coordinator Inpatient Diabetes Program [phone] (Team Pager from 8am to 5pm) [phone] ([facility] office) [phone] [facility] office) [phone] [facility] office)

## 2015-07-26 NOTE — Discharge Instructions (Addendum)
Heart Failure Clinic appointment on August 02, 2015 at 11:00am with [name] Kindred, FNP. Please call [phone] to reschedule.   Information on my medicine - ELIQUIS (apixaban)  Why was Eliquis prescribed for you? Eliquis was prescribed for you to reduce the risk of a blood clot forming that can cause a stroke if you have a medical condition called atrial fibrillation (a type of irregular heartbeat).  What do You need to know about Eliquis ? Take your Eliquis TWICE DAILY - one tablet in the morning and one tablet in the evening with or without food. If you have difficulty swallowing the tablet whole please discuss with your pharmacist how to take the medication safely.  Take Eliquis exactly as prescribed by your doctor and DO NOT stop taking Eliquis without talking to the doctor who prescribed the medication.  Stopping may increase your risk of developing a stroke.  Refill your prescription before you run out.  After discharge, you should have regular check-up appointments with your healthcare provider that is prescribing your Eliquis.  In the future your dose may need to be changed if your kidney function or weight changes by a significant amount or as you get older.  What do you do if you miss a dose? If you miss a dose, take it as soon as you remember on the same day and resume taking twice daily.  Do not take more than one dose of ELIQUIS at the same time to make up a missed dose.  Important Safety Information A possible side effect of Eliquis is bleeding. You should call your healthcare provider right away if you experience any of the following: ? Bleeding from an injury or your nose that does not stop. ? Unusual colored urine (red or dark brown) or unusual colored stools (red or black). ? Unusual bruising for unknown reasons. ? A serious fall or if you hit your head (even if there is no bleeding).  Some medicines may interact with Eliquis and might increase your risk of  bleeding or clotting while on Eliquis. To help avoid this, consult your healthcare provider or pharmacist prior to using any new prescription or non-prescription medications, including herbals, vitamins, non-steroidal anti-inflammatory drugs (NSAIDs) and supplements.  This website has more information on Eliquis (apixaban): http://www.eliquis.com/eliquis/home

## 2015-07-26 NOTE — Progress Notes (Addendum)
Physical Therapy Treatment Patient Details Name: [name] [name] MRN: [id number] DOB: 01-05-1935 Today's Date: 07/26/2015    History of Present Illness [name] [name] is a 81 y.o. female with a known history of chronic diastolic congestive heart failure, CAD status post stent placement 2014, hypothyroidism, rheumatoid arthritis, type 2 diabetes comes to the emergency room with complaints of increasing shortness of breath and leg swelling and weight gain of 15 pounds. She was seen by primary care physician yesterday and was asked to take extra doses of Lasix. Patient was also ask him to she decided to come today since she didn't have a ride yesterday. Patient was found to be in acute on chronic congestive heart failure diastolic (for further evaluation and management management. She did receive IV Lasix in the emergency room. Pt reports 2 falls in the last 12 months.    PT Comments    Pt agreeable to PT. Pt notes she desperately needs to use the bathroom upon arrival; does not feel she can make it to the bathroom (pt given diuretic). Pt transfers well without assistive device to bedside commode with Min guard. Returned to the bed post toileting and set up assist to Min A for personal hygiene. Pt sits edge of bed well today without complaints of dizziness or chest pain. O2 saturation monitored off O2 and remains 94-95% with/without activity. Pt participates in seated exercises as well as ambulation of 30 ft on room air. Pt does have moderate lean on rolling walker primarily due to left knee pain (9/10). Pt notes having a single point cane and rollator at home; encouraged use of rollator at this time due to lean on rolling walker and well and forward flexed posture and generalized weakness/deconditioning. Pt agreeable. Pt does not wish return to bed post session, but wishes to sit edge of bed with bedside commode close and refuses alarm. Nursing assistant notified and pt instructed to call for assist. Pt  also wishes to speak with care management. Care management notified and informed of session as well. Continue PT for progression of strength, endurance, quality and distance of ambulation to improve functional mobility.   Follow Up Recommendations  SNF     Equipment Recommendations  None recommended by PT (pt has rollator and SPC; encouraged use of rollator vs SPC)    Recommendations for Other Services       Precautions / Restrictions Precautions Precautions: Fall Restrictions Weight Bearing Restrictions: No    Mobility  Bed Mobility Overal bed mobility: Modified Independent Bed Mobility: Supine to Sit     Supine to sit: Modified independent (Device/Increase time);HOB elevated     General bed mobility comments: use of rails and mild increased time  Transfers Overall transfer level: Needs assistance Equipment used: None;Rolling walker (2 wheeled) Transfers: Sit to/from Stand Sit to Stand: Min guard         General transfer comment: Pt stands without AD for transfer bed to bedside commode; later with rw for ambulation. Good hand placement.  Ambulation/Gait Ambulation/Gait assistance: Min guard Ambulation Distance (Feet): 30 Feet Assistive device: Rolling walker (2 wheeled) Gait Pattern/deviations: Step-through pattern;Trunk flexed;Decreased weight shift to left;Decreased stride length Gait velocity: Decreased Gait velocity interpretation: <1.8 ft/sec, indicative of risk for recurrent falls General Gait Details: Ambulates slowly with sway side to side; moderate lean on rw   Stairs            Wheelchair Mobility    Modified Rankin (Stroke Patients Only)       Balance  Overall balance assessment: Needs assistance Sitting-balance support: No upper extremity supported;Feet supported Sitting balance-Leahy Scale: Good     Standing balance support: Bilateral upper extremity supported Standing balance-Leahy Scale: Fair                       Cognition Arousal/Alertness: Awake/alert Behavior During Therapy: WFL for tasks assessed/performed Overall Cognitive Status: Within Functional Limits for tasks assessed                      Exercises General Exercises - Lower Extremity Long Arc Quad: AROM;Both;20 reps;Seated Hip ABduction/ADduction: AROM;Both;20 reps;Seated Hip Flexion/Marching: AROM;Both;20 reps;Seated Toe Raises: AROM;Both;20 reps;Seated Heel Raises: AROM;Both;20 reps;Seated    General Comments        Pertinent Vitals/Pain Pain Assessment: 0-10 Pain Score: 9  Pain Location: L knee Pain Descriptors / Indicators: Aching;Sharp Pain Intervention(s): Monitored during session    Home Living                      Prior Function            PT Goals (current goals can now be found in the care plan section) Progress towards PT goals: Progressing toward goals    Frequency  Min 2X/week    PT Plan Current plan remains appropriate    Co-evaluation             End of Session Equipment Utilized During Treatment: Gait belt Activity Tolerance: Patient limited by pain;Patient limited by fatigue Patient left: Other (comment) (seated edge of bed, refuse alarm; nsg asst notified)     Time: 8341-9622 PT Time Calculation (min) (ACUTE ONLY): 38 min  Charges:  $Gait Training: 8-22 mins $Therapeutic Exercise: 8-22 mins $Therapeutic Activity: 8-22 mins                    G Codes:      Kristeen Miss, PTA 07/26/2015, 11:23 AM

## 2015-07-26 NOTE — NC FL2 (Signed)
[place] MEDICAID FL2 LEVEL OF CARE SCREENING TOOL     IDENTIFICATION  Patient Name: [name] [name] Birthdate: [birth year] Sex: female Admission Date (Current Location): 07/19/2015  [place] and [place] Number:  Chiropodist and Address:  [facility], [address] [place], [place] [address]      Provider Number: [id number]  Attending Physician Name and Address:  Alford Highland, MD  Relative Name and Phone Number:       Current Level of Care: Hospital Recommended Level of Care: Skilled Nursing Facility Prior Approval Number:    Date Approved/Denied:   PASRR Number:  [id number])  Discharge Plan: SNF    Current Diagnoses: Patient Active Problem List   Diagnosis Date Noted  . Hypertensive heart disease   . Acute on chronic diastolic CHF (congestive heart failure) (HCC)   . Coronary artery disease involving native coronary artery of native heart without angina pectoris   . Persistent atrial fibrillation (HCC)   . Dyspnea 07/18/2015  . Follicular acne 07/07/2015  . Acute diastolic heart failure (HCC) 06/16/2015  . Routine history and physical examination of adult 01/13/2015  . Dermatitis 06/27/2014  . CKD (chronic kidney disease), stage III   . Bradycardia 06/24/2014  . Diabetes mellitus type 2, uncontrolled (HCC) 03/25/2014  . Depression 03/25/2014  . Medicare annual wellness visit, subsequent 01/10/2014  . Anxiety state, unspecified 01/10/2014  . Carotid stenosis 11/16/2013  . CVA (cerebral infarction) 10/21/2013  . Diabetic eye exam (HCC) 09/03/2013  . Screening for breast cancer 01/14/2013  . Poorly controlled type 2 diabetes mellitus with circulatory disorder (HCC) 02/04/2012  . Chronic UTI 06/12/2011  . GERD (gastroesophageal reflux disease) 06/12/2011  . Hyperlipidemia 06/12/2011  . Hypertension 05/17/2011  . Hypothyroidism 03/07/2011  . Chronic back pain 02/21/2011  . Morbid obesity (HCC) 08/17/2008  . CAD (coronary  artery disease) 08/17/2008  . Atrial fibrillation (HCC) 08/17/2008  . Osteoarthritis, knee 08/17/2008    Orientation RESPIRATION BLADDER Height & Weight     Self, Time, Situation, Place  O2 (Nasal Cannula 4 (L/min) ) Continent Weight: 221 lb 8 oz (100.472 kg) Height:  5\' 2"  (157.5 cm)  BEHAVIORAL SYMPTOMS/MOOD NEUROLOGICAL BOWEL NUTRITION STATUS   (None)  (None) Continent Diet (heart healthy/carb modified )  AMBULATORY STATUS COMMUNICATION OF NEEDS Skin   Extensive Assist Verbally Normal                       Personal Care Assistance Level of Assistance  Bathing, Feeding, Dressing Bathing Assistance: Limited assistance Feeding assistance: Independent Dressing Assistance: Limited assistance     Functional Limitations Info  Sight, Hearing, Speech Sight Info: Adequate Hearing Info: Adequate Speech Info: Adequate    SPECIAL CARE FACTORS FREQUENCY  PT (By licensed PT)     PT Frequency:  (5)              Contractures      Additional Factors Info  Code Status, Allergies, Insulin Sliding Scale Code Status Info:  (Full Code) Allergies Info:  (Oxycodone)   Insulin Sliding Scale Info:  (insulin glargine (LANTUS) injection 14 Units 14 Units, Subcutaneous, Daily ; insulin aspart (novoLOG) injection 0-5 Units 0-5 Units, Subcutaneous, Daily at bedtime  & insulin aspart (novoLOG) injection 0-15 Units 0-15 Units, Subcutaneous, 3 times daily w)       Current Medications (07/26/2015):  This is the current hospital active medication list Current Facility-Administered Medications  Medication Dose Route Frequency Provider Last Rate Last Dose  .  acetaminophen (TYLENOL) tablet 650 mg  650 mg Oral Q6H PRN Enedina Finner, MD   650 mg at 07/19/15 1729   Or  . acetaminophen (TYLENOL) suppository 650 mg  650 mg Rectal Q6H PRN Enedina Finner, MD      . albuterol (PROVENTIL) (2.5 MG/3ML) 0.083% nebulizer solution 2.5 mg  2.5 mg Nebulization Q6H PRN Cindi Carbon, RPH   2.5 mg at 07/26/15 0240   . ALPRAZolam Prudy Feeler) tablet 0.25 mg  0.25 mg Oral TID PRN Houston Siren, MD   0.25 mg at 07/26/15 0245  . amiodarone (PACERONE) tablet 200 mg  200 mg Oral Daily Alford Highland, MD   200 mg at 07/26/15 0834  . amLODipine (NORVASC) tablet 10 mg  10 mg Oral Daily Enedina Finner, MD   10 mg at 07/26/15 0834  . apixaban (ELIQUIS) tablet 5 mg  5 mg Oral BID Enedina Finner, MD   5 mg at 07/26/15 4098  . atorvastatin (LIPITOR) tablet 20 mg  20 mg Oral Daily Enedina Finner, MD   20 mg at 07/26/15 1191  . budesonide (PULMICORT) nebulizer solution 0.25 mg  0.25 mg Nebulization BID Alford Highland, MD      . ferrous sulfate tablet 325 mg  325 mg Oral Q breakfast Enedina Finner, MD   325 mg at 07/26/15 4782  . fluticasone (FLONASE) 50 MCG/ACT nasal spray 2 spray  2 spray Each Nare Daily PRN Enedina Finner, MD      . gabapentin (NEURONTIN) capsule 300 mg  300 mg Oral TID Enedina Finner, MD   300 mg at 07/26/15 9562  . guaiFENesin (ROBITUSSIN) 100 MG/5ML solution 100 mg  5 mL Oral Q4H PRN Robley Fries, MD   100 mg at 07/22/15 2204  . HYDROcodone-acetaminophen (NORCO) 7.5-325 MG per tablet 1 tablet  1 tablet Oral Q6H PRN Milagros Loll, MD   1 tablet at 07/24/15 1426  . insulin aspart (novoLOG) injection 0-15 Units  0-15 Units Subcutaneous TID WC Alford Highland, MD      . insulin aspart (novoLOG) injection 0-5 Units  0-5 Units Subcutaneous QHS Alford Highland, MD      . Melene Muller ON 07/27/2015] insulin glargine (LANTUS) injection 14 Units  14 Units Subcutaneous Daily Alford Highland, MD      . latanoprost (XALATAN) 0.005 % ophthalmic solution 1 drop  1 drop Both Eyes QHS Enedina Finner, MD   1 drop at 07/19/15 2200  . levothyroxine (SYNTHROID, LEVOTHROID) tablet 150 mcg  150 mcg Oral QAC breakfast Enedina Finner, MD   150 mcg at 07/26/15 [phone]  . meclizine (ANTIVERT) tablet 25 mg  25 mg Oral TID PRN Enedina Finner, MD      . nitroGLYCERIN (NITROSTAT) SL tablet 0.4 mg  0.4 mg Sublingual Q5 min PRN Enedina Finner, MD      . ondansetron (ZOFRAN) tablet 4  mg  4 mg Oral Q6H PRN Enedina Finner, MD       Or  . ondansetron (ZOFRAN) injection 4 mg  4 mg Intravenous Q6H PRN Enedina Finner, MD   4 mg at 07/21/15 1031  . pantoprazole (PROTONIX) EC tablet 40 mg  40 mg Oral BID Alford Highland, MD   40 mg at 07/26/15 6578  . senna-docusate (Senokot-S) tablet 1 tablet  1 tablet Oral QHS PRN Enedina Finner, MD   1 tablet at 07/25/15 2145  . sertraline (ZOLOFT) tablet 50 mg  50 mg Oral Daily Enedina Finner, MD   50 mg at 07/25/15 2145  . torsemide (DEMADEX)  tablet 20 mg  20 mg Oral BID Iran Ouch, MD   20 mg at 07/26/15 0834  . traMADol (ULTRAM) tablet 100 mg  100 mg Oral Q6H PRN Alford Highland, MD   100 mg at 07/25/15 1205     Discharge Medications: Please see discharge summary for a list of discharge medications.  Relevant Imaging Results:  Relevant Lab Results:   Additional Information  (SSN [id number])  Verta Ellen Trishia Cuthrell, LCSW

## 2015-07-27 ENCOUNTER — Telehealth: Payer: Self-pay

## 2015-07-27 LAB — BASIC METABOLIC PANEL
ANION GAP: 6 (ref 5–15)
BUN: 26 mg/dL — ABNORMAL HIGH (ref 6–20)
CHLORIDE: 101 mmol/L (ref 101–111)
CO2: 36 mmol/L — AB (ref 22–32)
CREATININE: 1.29 mg/dL — AB (ref 0.44–1.00)
Calcium: 8.6 mg/dL — ABNORMAL LOW (ref 8.9–10.3)
GFR calc non Af Amer: 39 mL/min — ABNORMAL LOW (ref >60)
GFR, EST AFRICAN AMERICAN: 45 mL/min — AB (ref >60)
Glucose, Bld: 204 mg/dL — ABNORMAL HIGH (ref 65–99)
Potassium: 3.8 mmol/L (ref 3.5–5.1)
SODIUM: 143 mmol/L (ref 135–145)

## 2015-07-27 LAB — GLUCOSE, CAPILLARY
Glucose-Capillary: 150 mg/dL — ABNORMAL HIGH (ref 65–99)
Glucose-Capillary: 255 mg/dL — ABNORMAL HIGH (ref 65–99)

## 2015-07-27 MED ORDER — INSULIN GLARGINE 100 UNIT/ML ~~LOC~~ SOLN: 18.0000 [IU] | Freq: Every day | SUBCUTANEOUS | Status: DC

## 2015-07-27 MED ORDER — ALPRAZOLAM 0.25 MG PO TABS: 0.2500 mg | ORAL_TABLET | Freq: Two times a day (BID) | ORAL | Status: DC | PRN

## 2015-07-27 MED ORDER — FLUTICASONE PROPIONATE HFA 220 MCG/ACT IN AERO: 1.0000 | INHALATION_SPRAY | Freq: Two times a day (BID) | RESPIRATORY_TRACT | Status: DC

## 2015-07-27 MED ORDER — AMIODARONE HCL 200 MG PO TABS: 200.0000 mg | ORAL_TABLET | Freq: Every day | ORAL | Status: DC

## 2015-07-27 MED ORDER — APIXABAN 5 MG PO TABS
5.0000 mg | ORAL_TABLET | Freq: Two times a day (BID) | ORAL | Status: DC
Start: 2015-07-27 — End: 2015-09-07

## 2015-07-27 MED ORDER — TORSEMIDE 20 MG PO TABS: 20.0000 mg | ORAL_TABLET | Freq: Two times a day (BID) | ORAL | Status: DC

## 2015-07-27 MED ORDER — ALBUTEROL SULFATE HFA 108 (90 BASE) MCG/ACT IN AERS: 2.0000 | INHALATION_SPRAY | Freq: Four times a day (QID) | RESPIRATORY_TRACT | Status: AC | PRN

## 2015-07-27 MED ORDER — TORSEMIDE 20 MG PO TABS: 20.0000 mg | ORAL_TABLET | Freq: Every day | ORAL | Status: DC

## 2015-07-27 NOTE — Progress Notes (Signed)
Patient Name: [name] [name] Date of Encounter: 07/27/2015  Hospital Problem List     Principal Problem:   Acute on chronic diastolic CHF (congestive heart failure) (HCC) Active Problems:   Persistent atrial fibrillation (HCC)   Hyperlipidemia   Diabetes mellitus type 2, uncontrolled (HCC)   Hypertensive heart disease   Coronary artery disease involving native coronary artery of native heart without angina pectoris   Hypothyroidism   CKD (chronic kidney disease), stage III    Subjective   Breathing stable.  Maintaining sinus rhythm.  Looking forward to going home with [facility].  Inpatient Medications    . amiodarone  200 mg Oral Daily  . amLODipine  10 mg Oral Daily  . apixaban  5 mg Oral BID  . atorvastatin  20 mg Oral Daily  . budesonide (PULMICORT) nebulizer solution  0.25 mg Nebulization BID  . ferrous sulfate  325 mg Oral Q breakfast  . gabapentin  300 mg Oral TID  . insulin aspart  0-15 Units Subcutaneous TID WC  . insulin aspart  0-5 Units Subcutaneous QHS  . insulin glargine  14 Units Subcutaneous Daily  . latanoprost  1 drop Both Eyes QHS  . levothyroxine  150 mcg Oral QAC breakfast  . pantoprazole  40 mg Oral BID  . sertraline  50 mg Oral Daily  . torsemide  20 mg Oral BID    Vital Signs    Filed Vitals:   07/26/15 1947 07/27/15 0610 07/27/15 0724 07/27/15 0909  BP:  137/47  139/47  Pulse:  59  62  Temp:  98.3 F (36.8 C)    TempSrc:      Resp:  16    Height:      Weight:  222 lb 2.5 oz (100.77 kg)    SpO2: 94% 96% 96%     Intake/Output Summary (Last 24 hours) at 07/27/15 1026 Last data filed at 07/27/15 0809  Gross per 24 hour  Intake    480 ml  Output   1300 ml  Net   -820 ml   Filed Weights   07/25/15 0425 07/26/15 0415 07/27/15 0610  Weight: 223 lb 8 oz (101.379 kg) 221 lb 8 oz (100.472 kg) 222 lb 2.5 oz (100.77 kg)    Physical Exam    General: Pleasant, NAD. Neuro: Alert and oriented X 3. Moves all extremities spontaneously. Psych:  Normal affect. HEENT:  Normal  Neck: Supple without bruits or JVD. Lungs:  Resp regular and unlabored, bibasilar crackles. Heart: RRR no s3, s4, or murmurs. Abdomen: Soft, non-tender, non-distended, BS + x 4.  Extremities: No clubbing, cyanosis or edema. DP/PT/Radials 2+ and equal bilaterally.  Labs    CBC  Recent Labs  07/25/15 0406 07/26/15 1138  WBC  --  6.4  HGB 9.0* 9.4*  HCT  --  28.4*  MCV  --  83.6  PLT  --  276   Basic Metabolic Panel  Recent Labs  07/25/15 0406 07/27/15 0423  NA 141 143  K 4.0 3.8  CL 105 101  CO2 34* 36*  GLUCOSE 127* 204*  BUN 28* 26*  CREATININE 1.02* 1.29*  CALCIUM 8.2* 8.6*    Telemetry    Sinus rhythm  Radiology    Dg Chest 1 View  07/26/2015  CLINICAL DATA:  Increasing shortness of breath EXAM: CHEST 1 VIEW COMPARISON:  07/19/2015 FINDINGS: Lungs are essentially clear. No focal consolidation. No pleural effusion or pneumothorax. Cardiomegaly.  Prominence of the main pulmonary artery. IMPRESSION:  No evidence of acute cardiopulmonary disease. Electronically Signed   By: Charline Bills M.D.   On: 07/26/2015 09:35   Dg Chest 2 View  07/19/2015  CLINICAL DATA:  Chest pain under the left breast since 07/14/2015. Initial encounter. EXAM: CHEST  2 VIEW COMPARISON:  PA and lateral chest 06/15/2015. Plain film of the chest and right ribs 05/07/2015. FINDINGS: Lung volumes are somewhat low with mild basilar atelectasis. There is cardiomegaly without edema. No pneumothorax or pleural effusion. IMPRESSION: Cardiomegaly without acute disease. Electronically Signed   By: Drusilla Kanner M.D.   On: 07/19/2015 13:47    Assessment & Plan    1. Acute on chronic diastolic CHF:  2-3 month h/o progressive DOE, fatigue, and weight gain in the setting of persistent atrial fibrillation  EF of 55-65% by echo this admission. Net negative 5.79L this admission with reduction in wt by ~ 3 lbs (up a lb since yesterday). She does have crackles on exam  but neck veins are flat. I suspect crackles represent atelectasis.  CXR yesterdy did not show CHF. HR/BP stable. Creat up this AM.   -I suspect that now that she is in sinus rhythm, she will not need as much diuretic. -D/C on torsemide 20 daily. -She has f/u in CHF on 3/15 and will need a BMET @ that time.  She is concerned that she may not have a ride for f/u appts.  2. CAD:  S/p prior RCA interventions, Known moderate Ramus disease. Echo this admission shows normal EF with no wall motion abnormalities.  Continue statin.   blocker blocker has been on hold 2/2 relative bradycardia and sub 3 second pauses. No ASA, as she is on Eliquis.  ACEI has been on hold 2/2 elev creat during diuresis. Creat up this AM.  Cont to hold ACEI.  3. Atrial fibrillation, persistent:  She was in Atrial fib since at least mid-December with prior cardioversion in 2015.  S/P DCCV 3/7 - maintaining sinus rhythm and feeling better this AM. -On amiodarone 200mg  daily. HR's have been 50's to low 60's.  No further pauses. -Cont amio @ current dose. -Continue Eliquis for anticoagulation.   4. Anemia: Relatively stable since admission. This will contribute to fatigue and SOB -Consider anemia workup,iron studies.  5. Acute on chronic stage III kidney dzs: Creat up slightly this AM. -Reduce torsemide to 20 daily.  6. Deconditioning: Pt unwilling to go to SNF. Plan for d/c today with HH. She doesn't know if she will have rides for f/u appts - difficult situation but she absolutely refuses to consider SNF.  Signed, NP

## 2015-07-27 NOTE — Progress Notes (Signed)
A&O. Wearing O2 PRN. Medicated for pain and anxiety last night. No other issues.

## 2015-07-27 NOTE — Telephone Encounter (Signed)
Will follow up with patient. thanks

## 2015-07-27 NOTE — Telephone Encounter (Signed)
Patient contacted regarding discharge from [facility] on 07/27/15.  Patient understands to follow up with provider Eula Listen, PA on 08/15/15 at 2:00 at [facility]. Patient understands discharge instructions? yes Patient understands medications and regiment? yes Patient understands to bring all medications to this visit? yes

## 2015-07-27 NOTE — Progress Notes (Signed)
SATURATION QUALIFICATIONS: (This note is used to comply with regulatory documentation for home oxygen)  Patient Saturations on Room Air at Rest = 95%  Patient Saturations on Room Air while Ambulating = 92%  Patient Saturations on NA Liters of oxygen while Ambulating = NA  [employer]

## 2015-07-27 NOTE — Telephone Encounter (Signed)
-----   Message from Marina Goodell, NT sent at 07/27/2015  9:04 AM EST ----- Patient will discharge from [facility] on 07/27/15. She has an appt scheduled for 08/04/15 for her HFU

## 2015-07-27 NOTE — Progress Notes (Signed)
Patient d/c'd home with HH. Education provided, no questions at this time. Patient to be picked up by son. Telemetry removed. Mavrick Mcquigg R [place]  

## 2015-07-27 NOTE — Telephone Encounter (Signed)
-----   Message from Coralee Rud sent at 07/27/2015 10:19 AM EST ----- Regarding: tcm/ph 3/28 2pm Eula Listen, PA

## 2015-07-27 NOTE — Progress Notes (Signed)
   07/26/15 0230 07/26/15 0235  Oxygen Therapy  SpO2 (!) 87 % 90 %  O2 Device Room Air Nasal Cannula

## 2015-07-27 NOTE — Telephone Encounter (Signed)
Attempted to contact pt regarding discharge from [facility] on 07/27/15.  No answer, no machine.  Will attempt again later.

## 2015-07-27 NOTE — Progress Notes (Signed)
   07/26/15 0230 07/26/15 0235  Oxygen Therapy  SpO2 (!) 87 % (Communicated  by primary nurse this was at rest. ) 90 %  O2 Device Room Air Nasal Cannula

## 2015-07-27 NOTE — Care Management Important Message (Signed)
Important Message  Patient Details  Name: [name] [name] MRN: [id number] Date of Birth: 1935-01-13   Medicare Important Message Given:  Yes    Olegario Messier A Janyah Singleterry 07/27/2015, 9:57 AM

## 2015-07-27 NOTE — Care Management (Signed)
Patient is for discharge home today with home health SN PT OT Aide and SW.  She will also have new home 02.  Finalized referral to [facility] and accepted. Patient will be seen within 24 hours of discharge.  02 referral to [facility] as agency is in network.  Patient continues to be adamant about not going to a skilled nursing facility.  Discussed with Cala Bradford with [facility] that it is hoped with all the ordered disciplines it is hoped that there will be someone in the home daily instead of all the disciplines seeing patient on the same day

## 2015-07-27 NOTE — Progress Notes (Signed)
On 07/26/15 at 0230, primary nurse communicated that patient saturations were 87% on RA at rest, was omitted in previous documentation. Recovery saturations were 90% on 2L. Trudee Kuster

## 2015-07-27 NOTE — Discharge Summary (Signed)
[facility] -  at [facility]   PATIENT NAME: [name] [name]    MR#:  [id number]  DATE OF BIRTH:  [birth year]  DATE OF ADMISSION:  07/19/2015 ADMITTING PHYSICIAN: Antonieta Iba, MD  DATE OF DISCHARGE: 07/27/2015  4:43 PM  PRIMARY CARE PHYSICIAN: [name], MD    ADMISSION DIAGNOSIS:  Dyspnea [R06.00] Acute combined systolic and diastolic congestive heart failure (HCC) [I50.41] Anginal equivalent (HCC) [I20.8]  DISCHARGE DIAGNOSIS:  Principal Problem:   Acute on chronic diastolic CHF (congestive heart failure) (HCC) Active Problems:   Hypothyroidism   Hyperlipidemia   Diabetes mellitus type 2, uncontrolled (HCC)   CKD (chronic kidney disease), stage III   Hypertensive heart disease   Coronary artery disease involving native coronary artery of native heart without angina pectoris   Persistent atrial fibrillation (HCC)   SECONDARY DIAGNOSIS:   Past Medical History  Diagnosis Date  . DM (diabetes mellitus) (HCC)   . PAF (paroxysmal atrial fibrillation) (HCC)     a. s/p dccv 04/2014;  b. amio/eliquis;  c. 06/2013 bb/ccb d/c 2/2 symptomatic bradycardia; d. 04/2015 recurrent AF->amio increased/bb resumed.  . Dyslipidemia   . CAD (coronary artery disease)     a. 2000 s/p PCI RCA;  b. 2005 PCI of RCA 2/2 ISR; c. 04/2013 Neg MV;  d. 05/2013 Cath: LM 20, LAD 30p, LCX 20p, OM1 90 small, RCA 58m (PTCA - FFR 0.86), PDA 40.  . Osteoarthritis   . Hypothyroidism     hx  . Rheumatoid arthritis(714.0)   . Obesity   . GERD (gastroesophageal reflux disease)   . Shoulder fracture, left     a. Dr. Martha Clan  . Cerebral infarction (HCC)   . CKD (chronic kidney disease), stage III   . Asthma   . Chronic diastolic CHF (congestive heart failure) (HCC)     a. 10/2013 Echo: EF 60-65%, no rwma, Gr1 DD, mild MR, mildly dil LA.  Marland Kitchen Hypertensive heart disease   . Right rib fracture     a. 04/2015.    HOSPITAL COURSE:   1. Acute on chronic  respiratory failure with hypoxia. Patient does qualify for oxygen at home and will be set up with 2 L nasal cannula continuous. 2. Acute on chronic diastolic congestive heart failure continue oral torsemide twice a day. Hold metoprolol with bradycardia. Follow-up with cardiology as outpatient. 3. Chronic atrial fibrillation status post cardioversion. Patient currently in normal sinus rhythm. Patient on lower dose amiodarone 200 mg daily. Continue eliquis for anticoagulation. 4. Sinus pauses. These were seen prior to cardioversion. None seen after cardioversion. 5. Acute renal failure with over diuresis. Creatinine is normalized. Restarted oral torsemide. 6. Type 2 diabetes mellitus without complication hold metformin and continue Lantus. 7. Glaucoma continue Xalatan 8. Depression and anxiety continue Zoloft and Xanax 9. Gastroesophageal reflux disease without esophagitis on PPI 10. Weakness. Patient refused rehabilitation and wants to go home. Patient set up with physical therapy, RN, aide and social work at home. 11. Abdominal pain. Patient on PPI   DISCHARGE CONDITIONS:   Satisfactory  CONSULTS OBTAINED:  Treatment Team:  Antonieta Iba, MD  DRUG ALLERGIES:   Allergies  Allergen Reactions  . Oxycodone Nausea And Vomiting    DISCHARGE MEDICATIONS:   Discharge Medication List as of 07/27/2015  3:17 PM    START taking these medications   Details  ALPRAZolam (XANAX) 0.25 MG tablet Take 1 tablet (0.25 mg total) by mouth 2 (two) times daily as needed for anxiety., Starting  07/27/2015, Until Discontinued, Print    fluticasone (FLOVENT HFA) 220 MCG/ACT inhaler Inhale 1 puff into the lungs 2 (two) times daily., Starting 07/27/2015, Until Discontinued, Print    torsemide (DEMADEX) 20 MG tablet Take 1 tablet (20 mg total) by mouth 2 (two) times daily., Starting 07/27/2015, Until Discontinued, Print      CONTINUE these medications which have CHANGED   Details  albuterol (PROVENTIL  HFA;VENTOLIN HFA) 108 (90 Base) MCG/ACT inhaler Inhale 2 puffs into the lungs every 6 (six) hours as needed for wheezing or shortness of breath., Starting 07/27/2015, Until Discontinued, Print    amiodarone (PACERONE) 200 MG tablet Take 1 tablet (200 mg total) by mouth daily., Starting 07/27/2015, Until Discontinued, Print    apixaban (ELIQUIS) 5 MG TABS tablet Take 1 tablet (5 mg total) by mouth 2 (two) times daily., Starting 07/27/2015, Until Discontinued, Print    insulin glargine (LANTUS) 100 UNIT/ML injection Inject 0.18 mLs (18 Units total) into the skin daily., Starting 07/27/2015, Until Discontinued, No Print      CONTINUE these medications which have NOT CHANGED   Details  amLODipine (NORVASC) 10 MG tablet Take 1 tablet (10 mg total) by mouth daily., Starting 02/17/2015, Until Discontinued, Normal    atorvastatin (LIPITOR) 20 MG tablet Take 1 tablet (20 mg total) by mouth daily., Starting 04/06/2015, Until Discontinued, Normal    ferrous sulfate 325 (65 FE) MG tablet Take 325 mg by mouth daily with breakfast., Until Discontinued, Historical Med    fluticasone (FLONASE) 50 MCG/ACT nasal spray Place 2 sprays into both nostrils daily as needed for rhinitis., Until Discontinued, Historical Med    gabapentin (NEURONTIN) 300 MG capsule Take 1 capsule (300 mg total) by mouth 3 (three) times daily., Starting 09/13/2014, Until Discontinued, Normal    insulin aspart (NOVOLOG) 100 UNIT/ML injection Inject 2-10 Units into the skin 3 (three) times daily with meals as needed for high blood sugar. Pt uses per sliding scale:  140-199:  2 units  200-250:  4 units  251-299:  6 units  300-349:  8 units  Greater than 350: 10 units, Until Discontinued,  Historical Med    latanoprost (XALATAN) 0.005 % ophthalmic solution Place 1 drop into both eyes at bedtime. , Until Discontinued, Historical Med    levothyroxine (SYNTHROID, LEVOTHROID) 150 MCG tablet Take 150 mcg by mouth daily before breakfast., Until  Discontinued, Historical Med    lisinopril (PRINIVIL,ZESTRIL) 10 MG tablet Take 1 tablet (10 mg total) by mouth daily., Starting 07/12/2015, Until Discontinued, Normal    nitroGLYCERIN (NITROSTAT) 0.4 MG SL tablet Place 0.4 mg under the tongue every 5 (five) minutes as needed for chest pain., Until Discontinued, Historical Med    omeprazole (PRILOSEC) 20 MG capsule Take 1 capsule (20 mg total) by mouth 2 (two) times daily., Starting 01/12/2015, Until Fri 05/17/16, Normal    ondansetron (ZOFRAN-ODT) 4 MG disintegrating tablet Take 1 tablet (4 mg total) by mouth every 8 (eight) hours as needed for nausea or vomiting., Starting 07/12/2015, Until Discontinued, Print    potassium chloride SA (K-DUR,KLOR-CON) 20 MEQ tablet Take 1 tablet (20 mEq total) by mouth daily., Starting 06/26/2014, Until Discontinued, Normal    sertraline (ZOLOFT) 50 MG tablet Take 1 tablet (50 mg total) by mouth daily., Starting 05/27/2014, Until Discontinued, Normal    traMADol (ULTRAM) 50 MG tablet Take 100 mg by mouth every 8 (eight) hours as needed for moderate pain., Until Discontinued, Historical Med      STOP taking these medications  furosemide (LASIX) 40 MG tablet      meclizine (ANTIVERT) 25 MG tablet      metFORMIN (GLUCOPHAGE) 500 MG tablet      metoprolol tartrate (LOPRESSOR) 25 MG tablet      gentamicin ointment (GARAMYCIN) 0.1 %      HYDROcodone-acetaminophen (NORCO/VICODIN) 5-325 MG tablet          DISCHARGE INSTRUCTIONS:   Follow-up PMD one week Follow up cardiology 2 weeks  If you experience worsening of your admission symptoms, develop shortness of breath, life threatening emergency, suicidal or homicidal thoughts you must seek medical attention immediately by calling 911 or calling your MD immediately  if symptoms less severe.  You Must read complete instructions/literature along with all the possible adverse reactions/side effects for all the Medicines you take and that have been  prescribed to you. Take any new Medicines after you have completely understood and accept all the possible adverse reactions/side effects.   Please note  You were cared for by a hospitalist during your hospital stay. If you have any questions about your discharge medications or the care you received while you were in the hospital after you are discharged, you can call the unit and asked to speak with the hospitalist on call if the hospitalist that took care of you is not available. Once you are discharged, your primary care physician will handle any further medical issues. Please note that NO REFILLS for any discharge medications will be authorized once you are discharged, as it is imperative that you return to your primary care physician (or establish a relationship with a primary care physician if you do not have one) for your aftercare needs so that they can reassess your need for medications and monitor your lab values.    Today   CHIEF COMPLAINT:   Chief Complaint  Patient presents with  . Chest Pain  . Leg Swelling    HISTORY OF PRESENT ILLNESS:  Calee Hugunin  is a 81 y.o. female presented with chest pain and leg swelling and was found to be in CHF   VITAL SIGNS:  Blood pressure 131/43, pulse 58, temperature 97.5 F (36.4 C), temperature source Oral, resp. rate 16, height 5\' 2"  (1.575 m), weight 100.77 kg (222 lb 2.5 oz), SpO2 95 %.    PHYSICAL EXAMINATION:  GENERAL:  81 y.o.-year-old patient lying in the bed with no acute distress.  EYES: Pupils equal, round, reactive to light and accommodation. No scleral icterus. Extraocular muscles intact.  HEENT: Head atraumatic, normocephalic. Oropharynx and nasopharynx clear.  NECK:  Supple, no jugular venous distention. No thyroid enlargement, no tenderness.  LUNGS: Normal breath sounds bilaterally, no wheezing, rales,rhonchi or crepitation. No use of accessory muscles of respiration.  CARDIOVASCULAR: S1, S2 normal. No murmurs, rubs,  or gallops.  ABDOMEN: Soft, non-tender, non-distended. Bowel sounds present. No organomegaly or mass.  EXTREMITIES: 3+ edema, no cyanosis, or clubbing.  NEUROLOGIC: Cranial nerves II through XII are intact. Muscle strength 5/5 in all extremities. Sensation intact. Gait not checked.  PSYCHIATRIC: The patient is alert and oriented x 3.  SKIN: No obvious rash, lesion, or ulcer.   DATA REVIEW:   CBC  Recent Labs Lab 07/26/15 1138  WBC 6.4  HGB 9.4*  HCT 28.4*  PLT 276    Chemistries   Recent Labs Lab 07/27/15 0423  NA 143  K 3.8  CL 101  CO2 36*  GLUCOSE 204*  BUN 26*  CREATININE 1.29*  CALCIUM 8.6*    RADIOLOGY:  Dg Chest 1 View  07/26/2015  CLINICAL DATA:  Increasing shortness of breath EXAM: CHEST 1 VIEW COMPARISON:  07/19/2015 FINDINGS: Lungs are essentially clear. No focal consolidation. No pleural effusion or pneumothorax. Cardiomegaly.  Prominence of the main pulmonary artery. IMPRESSION: No evidence of acute cardiopulmonary disease. Electronically Signed   By: Charline Bills M.D.   On: 07/26/2015 09:35    Management plans discussed with the patient, family and they are in agreement.  CODE STATUS:     Code Status Orders        Start     Ordered   07/19/15 1737  Full code   Continuous     07/19/15 1736    Code Status History    Date Active Date Inactive Code Status Order ID Comments User Context   06/24/2014 10:17 PM 06/26/2014  2:41 PM Full Code [id number]  Yaakov Guthrie, MD ED   10/21/2013  4:33 PM 10/27/2013  9:15 PM Full Code [id number]  Jeralyn Bennett, MD Inpatient      TOTAL TIME TAKING CARE OF THIS PATIENT: 35 minutes.    Alford Highland M.D on 07/27/2015 at 5:03 PM  Between 7am to 6pm - Pager - [phone]  After 6pm go to [email or web address] - password [email or web address] [facility]  [place] [facility]  Office  [phone]  CC: Primary care physician; [name], MD

## 2015-07-28 ENCOUNTER — Telehealth: Payer: Self-pay | Admitting: Internal Medicine

## 2015-07-28 ENCOUNTER — Telehealth: Payer: Self-pay

## 2015-07-28 NOTE — Telephone Encounter (Signed)
Unable yo reach patient.  Will continue to follow as appropriate.

## 2015-07-28 NOTE — Telephone Encounter (Signed)
Please advise, thanks.

## 2015-07-28 NOTE — Telephone Encounter (Signed)
Orders for the pt should be placed in the box

## 2015-07-28 NOTE — Telephone Encounter (Signed)
[name] [name] [phone] called from [place] regarding pt being admitted into home health services. Pt was discharged from [facility] yesterday. Order needs to be signed. She stated order will be sent. Thank you!

## 2015-07-31 ENCOUNTER — Telehealth: Payer: Self-pay

## 2015-07-31 NOTE — Telephone Encounter (Signed)
Transition Care Management Follow-up Telephone Call   Date discharged? 07/27/15   How have you been since you were released from the hospital? Doing ok.  Using my inhaler when needed.  Not much SOB.  No pain.  Eating/drinking without issues.     Do you understand why you were in the hospital? Yes, I was having trouble breathing.   Do you understand the discharge instructions? Yes, I am taking it easy.  Increasing activity as tolerated.   Where were you discharged to? Home.   Items Reviewed:  Medications reviewed: Yes, taking medications as scheduled.  Allergies reviewed: Yes, no changes.  Dietary changes reviewed: Yes, no problems.  Referrals reviewed: Yes, cardiology appointment scheduled, per her.   Functional Questionnaire:   Activities of Daily Living (ADLs):   She states they are independent in the following: Bathing, dressing, meal prep, grooming, self feeding, toileting. States they require assistance with the following: Ambulating (uses walker).   Any transportation issues/concerns?: Yes, I may have to cancel this appointment if my ride fails.   Any patient concerns? None at this time. Encouraged to follow up with PCP as needed.   Confirmed importance and date/time of follow-up visits scheduled Yes, appointment scheduled 08/04/15 at 2pm.  Provider Appointment booked with Dr. Dan Humphreys (PCP).  Confirmed with patient if condition begins to worsen call PCP or go to the ER.  Patient was given the office number and encouraged to call back with question or concerns.  : Yes, patient verbalized understanding.

## 2015-07-31 NOTE — Telephone Encounter (Signed)
Unable to reach patient.  Will continue to follow up with transitional care management.  Hospital follow up appointment scheduled with PCP.

## 2015-08-01 ENCOUNTER — Telehealth: Payer: Self-pay | Admitting: *Deleted

## 2015-08-01 ENCOUNTER — Telehealth: Payer: Self-pay | Admitting: Internal Medicine

## 2015-08-01 NOTE — Telephone Encounter (Signed)
Fine to start PT

## 2015-08-01 NOTE — Telephone Encounter (Signed)
Please advise okay to give a verbal for pt's PT, thanks

## 2015-08-01 NOTE — Telephone Encounter (Signed)
Form signed and faxed back to sender

## 2015-08-01 NOTE — Telephone Encounter (Signed)
Kathie Rhodes from [facility] home [facility], stated that patient has not taken her torsemide, because she did not have a ride to ger the prescription filled. She was advised by the hospital to stopped her fluid medication,however, she continued to take the medication.  Kathie Rhodes requested to have a return call, to discuss the issue.  [phone]

## 2015-08-01 NOTE — Telephone Encounter (Signed)
Clayton Lefort [phone] called from [facility] home health PT needs a verbal approval for him to see pt 2 x times a week for 3 weeks for strengthening balance, gate training,fall prevention and home exercise program. Thank you!

## 2015-08-01 NOTE — Telephone Encounter (Signed)
Lemuel notified. 

## 2015-08-02 ENCOUNTER — Ambulatory Visit: Payer: Medicare Other | Admitting: Family

## 2015-08-02 ENCOUNTER — Other Ambulatory Visit: Payer: Self-pay | Admitting: *Deleted

## 2015-08-02 DIAGNOSIS — G629 Polyneuropathy, unspecified: Secondary | ICD-10-CM

## 2015-08-02 MED ORDER — FLUTICASONE PROPIONATE HFA 220 MCG/ACT IN AERO: 1.0000 | INHALATION_SPRAY | Freq: Two times a day (BID) | RESPIRATORY_TRACT | Status: DC

## 2015-08-02 MED ORDER — INSULIN ASPART 100 UNIT/ML ~~LOC~~ SOLN: 2.0000 [IU] | Freq: Three times a day (TID) | SUBCUTANEOUS | Status: DC | PRN

## 2015-08-02 MED ORDER — ALPRAZOLAM 0.25 MG PO TABS: 0.2500 mg | ORAL_TABLET | Freq: Two times a day (BID) | ORAL | Status: DC | PRN

## 2015-08-02 MED ORDER — GABAPENTIN 300 MG PO CAPS: 300.0000 mg | ORAL_CAPSULE | Freq: Three times a day (TID) | ORAL | Status: DC

## 2015-08-02 MED ORDER — TORSEMIDE 20 MG PO TABS: 20.0000 mg | ORAL_TABLET | Freq: Two times a day (BID) | ORAL | Status: DC

## 2015-08-02 NOTE — Telephone Encounter (Signed)
FYI: Pt is currently just taking her lasix, pt did not take other meds filled b/c she does not have transportation to p/u.

## 2015-08-02 NOTE — Telephone Encounter (Signed)
She needs to fill the prescription as prescribed at the time of her hospital discharge.

## 2015-08-03 ENCOUNTER — Ambulatory Visit: Payer: Medicare Other | Admitting: Cardiovascular Disease

## 2015-08-03 NOTE — Telephone Encounter (Signed)
Contacted a pharmacy with delivery service yesterday to deliver pt her medications, pt received her prescriptions last night

## 2015-08-04 ENCOUNTER — Encounter: Payer: Self-pay | Admitting: Internal Medicine

## 2015-08-04 ENCOUNTER — Ambulatory Visit (INDEPENDENT_AMBULATORY_CARE_PROVIDER_SITE_OTHER): Payer: Medicare Other | Admitting: Internal Medicine

## 2015-08-04 ENCOUNTER — Ambulatory Visit: Payer: Medicare Other | Admitting: Internal Medicine

## 2015-08-04 VITALS — BP 131/48 | HR 47 | Temp 97.8°F | Ht 63.0 in | Wt 211.1 lb

## 2015-08-04 DIAGNOSIS — I5033 Acute on chronic diastolic (congestive) heart failure: Secondary | ICD-10-CM | POA: Diagnosis not present

## 2015-08-04 DIAGNOSIS — I4819 Other persistent atrial fibrillation: Secondary | ICD-10-CM

## 2015-08-04 DIAGNOSIS — N183 Chronic kidney disease, stage 3 unspecified: Secondary | ICD-10-CM

## 2015-08-04 DIAGNOSIS — I481 Persistent atrial fibrillation: Secondary | ICD-10-CM | POA: Diagnosis not present

## 2015-08-04 LAB — COMPREHENSIVE METABOLIC PANEL
ALK PHOS: 115 U/L (ref 39–117)
ALT: 16 U/L (ref 0–35)
AST: 19 U/L (ref 0–37)
Albumin: 3.9 g/dL (ref 3.5–5.2)
BILIRUBIN TOTAL: 0.4 mg/dL (ref 0.2–1.2)
BUN: 24 mg/dL — ABNORMAL HIGH (ref 6–23)
CALCIUM: 9.6 mg/dL (ref 8.4–10.5)
CO2: 29 meq/L (ref 19–32)
CREATININE: 1.24 mg/dL — AB (ref 0.40–1.20)
Chloride: 104 mEq/L (ref 96–112)
GFR: 44.43 mL/min — ABNORMAL LOW (ref >60.00)
GLUCOSE: 156 mg/dL — AB (ref 70–99)
Potassium: 4 mEq/L (ref 3.5–5.1)
Sodium: 142 mEq/L (ref 135–145)
TOTAL PROTEIN: 7.6 g/dL (ref 6.0–8.3)

## 2015-08-04 MED ORDER — TRAMADOL HCL 50 MG PO TABS: 100.0000 mg | ORAL_TABLET | Freq: Three times a day (TID) | ORAL | Status: DC | PRN

## 2015-08-04 NOTE — Patient Instructions (Signed)
Labs today.  Follow up in 4 weeks. 

## 2015-08-04 NOTE — Progress Notes (Signed)
Subjective:    Patient ID: [name] [name], female    DOB: Sep 03, 1934, 81 y.o.   MRN: [id number]  HPI  81YO female presents for hospital follow up.  ADMITTED: 07/19/2015 DISCHARGED: 07/27/2015  DIAGNOSIS: Dyspnea, Acute Systolic and Diastolic Heart Failure, Angina.  Admitted through ER for hypoxia, dyspnea. Started on Torsemide with improvement in symptoms. Metoprolol held.  Feeling okay. Not wearing oxygen at home. Taking Torsemide in the mornings. Occasional chest pain, took 2 NTG with improvement.  Continues to have knee pain. Has evaluation pending with ortho this month. Using Tramadol with some improvement in pain.  DM - BG up and down. BG went down 50 yesterday. Reports compliance with insulin.  Wt Readings from Last 3 Encounters:  08/04/15 211 lb 2 oz (95.766 kg)  07/27/15 222 lb 2.5 oz (100.77 kg)  07/18/15 225 lb 8 oz (102.286 kg)   BP Readings from Last 3 Encounters:  08/04/15 131/48  07/27/15 131/43  07/18/15 132/73    Past Medical History  Diagnosis Date  . DM (diabetes mellitus) (HCC)   . PAF (paroxysmal atrial fibrillation) (HCC)     a. s/p dccv 04/2014;  b. amio/eliquis;  c. 06/2013 bb/ccb d/c 2/2 symptomatic bradycardia; d. 04/2015 recurrent AF->amio increased/bb resumed.  . Dyslipidemia   . CAD (coronary artery disease)     a. 2000 s/p PCI RCA;  b. 2005 PCI of RCA 2/2 ISR; c. 04/2013 Neg MV;  d. 05/2013 Cath: LM 20, LAD 30p, LCX 20p, OM1 90 small, RCA 12m (PTCA - FFR 0.86), PDA 40.  . Osteoarthritis   . Hypothyroidism     hx  . Rheumatoid arthritis(714.0)   . Obesity   . GERD (gastroesophageal reflux disease)   . Shoulder fracture, left     a. Dr. Martha Clan  . Cerebral infarction (HCC)   . CKD (chronic kidney disease), stage III   . Asthma   . Chronic diastolic CHF (congestive heart failure) (HCC)     a. 10/2013 Echo: EF 60-65%, no rwma, Gr1 DD, mild MR, mildly dil LA.  Marland Kitchen Hypertensive heart disease   . Right rib fracture     a. 04/2015.   Family  History  Problem Relation Age of Onset  . Esophageal cancer Mother   . Diabetes Mother   . Cancer Mother     esophageal    Past Surgical History  Procedure Laterality Date  . Rca stent placement  2000  . Restenosis with ptca placement  2005  . Cataract surgery    . Partial hysterectomy    . Replacement total knee  2013    right  . Shoulder surgery  02/04/13  . Cardiac catheterization  06/11/2013  . Coronary angioplasty    . Electrophysiologic study N/A 07/25/2015    Procedure: CARDIOVERSION;  Surgeon: Antonieta Iba, MD;  Location: [facility] ORS;  Service: Cardiovascular;  Laterality: N/A;   Social History   Social History  . Marital Status: Married    Spouse Name: N/A  . Number of Children: 3  . Years of Education: N/A   Occupational History  . retired    Social History Main Topics  . Smoking status: Never Smoker   . Smokeless tobacco: Never Used     Comment: former passive smoker  . Alcohol Use: No  . Drug Use: No  . Sexual Activity: No   Other Topics Concern  . None   Social History Narrative   Widow; lives in [place]. Three children. 4 grandchildren; 4  great grandchildren   Regular exercise: yes   Caffeine use: tea occasionally    Review of Systems  Constitutional: Negative for fever, chills, appetite change, fatigue and unexpected weight change.  Eyes: Negative for visual disturbance.  Respiratory: Negative for cough, shortness of breath and wheezing.   Cardiovascular: Negative for chest pain and leg swelling.  Gastrointestinal: Negative for nausea, vomiting, abdominal pain, diarrhea and constipation.  Musculoskeletal: Positive for myalgias, arthralgias and gait problem.  Skin: Negative for color change and rash.  Hematological: Negative for adenopathy. Does not bruise/bleed easily.  Psychiatric/Behavioral: Negative for sleep disturbance and dysphoric mood. The patient is not nervous/anxious.        Objective:    BP 131/48 mmHg  Pulse 47  Temp(Src) 97.8  F (36.6 C) (Oral)  Ht 5\' 3"  (1.6 m)  Wt 211 lb 2 oz (95.766 kg)  BMI 37.41 kg/m2  SpO2 97% Physical Exam  Constitutional: She is oriented to person, place, and time. She appears well-developed and well-nourished. No distress.  HENT:  Head: Normocephalic and atraumatic.  Right Ear: External ear normal.  Left Ear: External ear normal.  Nose: Nose normal.  Mouth/Throat: Oropharynx is clear and moist. No oropharyngeal exudate.  Eyes: Conjunctivae are normal. Pupils are equal, round, and reactive to light. Right eye exhibits no discharge. Left eye exhibits no discharge. No scleral icterus.  Neck: Normal range of motion. Neck supple. No tracheal deviation present. No thyromegaly present.  Cardiovascular: Normal rate, regular rhythm, normal heart sounds and intact distal pulses.  Exam reveals no gallop and no friction rub.   No murmur heard. Pulmonary/Chest: Effort normal and breath sounds normal. No respiratory distress. She has no wheezes. She has no rales. She exhibits no tenderness.  Musculoskeletal: Normal range of motion. She exhibits no edema or tenderness.  Lymphadenopathy:    She has no cervical adenopathy.  Neurological: She is alert and oriented to person, place, and time. No cranial nerve deficit. She exhibits normal muscle tone. Coordination normal.  Skin: Skin is warm and dry. No rash noted. She is not diaphoretic. No erythema. No pallor.  Psychiatric: She has a normal mood and affect. Her behavior is normal. Judgment and thought content normal.          Assessment & Plan:   Problem List Items Addressed This Visit      Unprioritized   Acute on chronic diastolic CHF (congestive heart failure) (HCC) - Primary    Symptomatically doing well. Will recheck electrolytes and renal function given recent change to Torsemide. Follow up in 4 weeks here and on 3/28 as scheduled with Cardiology. Note that our staff arranged for her medications to be delivered from [facility] as she  had no transportation and was out of medication for 2 days post discharge.      Relevant Orders   Comprehensive metabolic panel   Atrial fibrillation (HCC)    Rate is low despite holding Metoprolol. Encouraged follow up with cardiology. Question if she might need a pacemaker.      CKD (chronic kidney disease), stage III    Repeat renal function with labs today.          Return in about 4 weeks (around 09/01/2015) for Recheck.  09/03/2015, MD Internal Medicine [facility]

## 2015-08-04 NOTE — Progress Notes (Signed)
Pre visit review using our clinic review tool, if applicable. No additional management support is needed unless otherwise documented below in the visit note. 

## 2015-08-04 NOTE — Assessment & Plan Note (Signed)
Symptomatically doing well. Will recheck electrolytes and renal function given recent change to Torsemide. Follow up in 4 weeks here and on 3/28 as scheduled with Cardiology. Note that our staff arranged for her medications to be delivered from [facility] as she had no transportation and was out of medication for 2 days post discharge.

## 2015-08-04 NOTE — Assessment & Plan Note (Signed)
Repeat renal function with labs today. 

## 2015-08-04 NOTE — Assessment & Plan Note (Signed)
Rate is low despite holding Metoprolol. Encouraged follow up with cardiology. Question if she might need a pacemaker.

## 2015-08-09 ENCOUNTER — Telehealth: Payer: Self-pay

## 2015-08-09 NOTE — Telephone Encounter (Signed)
She needs to be seen in follow up with her cardiologist. She was recently discharged from the hospital. Does she have follow up with cardiology? If dizziness persists, then needs to be seen in ER today.

## 2015-08-09 NOTE — Telephone Encounter (Signed)
[name] [name] called and stated that the pt's heart rate was 45 five minutes ago, pt had a bit of a dizzy speed when she stood up. [name] [name] felt she needed to call b/c she has never had a reading that low before, pt is currently stable. Please advise, thanks

## 2015-08-09 NOTE — Telephone Encounter (Signed)
Notified Betty of Dr. Tilman Neat comments, Kathie Rhodes verbalized understanding.

## 2015-08-11 ENCOUNTER — Telehealth: Payer: Self-pay | Admitting: Cardiovascular Disease

## 2015-08-11 NOTE — Telephone Encounter (Signed)
Spoke w/ Kathie Rhodes, RN w/ [facility]. She reports pt's HR today 44-46, BP 120/60. Pt denies dizziness or balance problems, she does have some back pain.  Spoke w/ Dr. Mariah Milling.  He does not want to adjust amiodarone doseage (pt takes 200mg  once daily) b/c he doesn't want her to pop back into afib.  that Dr. Ashok Cordia does not want to make any med adjustments at this time, to continue to monitor and have pt keep her upcoming appt.  She is agreeable and will call back if pt becomes symptomatic.

## 2015-08-11 NOTE — Telephone Encounter (Signed)
H/H nurse Kathie Rhodes stated that patient pulse is 44 - 46  Patient denies any other symptoms ( no dizziness)  bp 130/80 last night not taken today

## 2015-08-15 ENCOUNTER — Ambulatory Visit (INDEPENDENT_AMBULATORY_CARE_PROVIDER_SITE_OTHER): Payer: Medicare Other | Admitting: Physician Assistant

## 2015-08-15 ENCOUNTER — Encounter: Payer: Self-pay | Admitting: Physician Assistant

## 2015-08-15 VITALS — BP 126/40 | HR 46 | Ht 63.0 in | Wt 210.5 lb

## 2015-08-15 DIAGNOSIS — I5032 Chronic diastolic (congestive) heart failure: Secondary | ICD-10-CM | POA: Diagnosis not present

## 2015-08-15 DIAGNOSIS — I4891 Unspecified atrial fibrillation: Secondary | ICD-10-CM

## 2015-08-15 DIAGNOSIS — I4819 Other persistent atrial fibrillation: Secondary | ICD-10-CM

## 2015-08-15 DIAGNOSIS — R001 Bradycardia, unspecified: Secondary | ICD-10-CM

## 2015-08-15 DIAGNOSIS — I481 Persistent atrial fibrillation: Secondary | ICD-10-CM

## 2015-08-15 DIAGNOSIS — I110 Hypertensive heart disease with heart failure: Secondary | ICD-10-CM

## 2015-08-15 DIAGNOSIS — R0602 Shortness of breath: Secondary | ICD-10-CM | POA: Diagnosis not present

## 2015-08-15 DIAGNOSIS — I2510 Atherosclerotic heart disease of native coronary artery without angina pectoris: Secondary | ICD-10-CM

## 2015-08-15 MED ORDER — AMIODARONE HCL 200 MG PO TABS: 200.0000 mg | ORAL_TABLET | Freq: Every day | ORAL | Status: DC

## 2015-08-15 MED ORDER — AMIODARONE HCL 200 MG PO TABS: 200.0000 mg | ORAL_TABLET | Freq: Every day | ORAL | Status: AC

## 2015-08-15 NOTE — Progress Notes (Signed)
Cardiology Office Note Date:  08/15/2015  Patient ID:  [name], [name] 02-06-1935, MRN [id number] PCP:  [name], MD  Cardiologist:  Dr. Mariah Milling, MD    Chief Complaint: Hospital follow up  History of Present Illness: [name] [name] is a 81 y.o. female with history of CAD s/p prior PCI, PAF s/p DCCV 04/2014 and 07/25/2015 on Eliquis and amiodarone, recently diagnosed chronic respiratory failure now on home oxygen at 2L via nasal cannula, chronic diastolic CHF, CKD stage III, hypertensive heart disease, prior stroke, HLD, DM, and obesity who presents for hospital follow up of her recent admission to [facility] from 3/1 to 3/9 for acute on chronic respiratory failure and acute on chronic diastolic CHF.   She has history of CAD with prior RCA interventions and last underwent cardaic cath in 05/2013 related to ISR. Cath at that time showed LM 20%, proximal LAD 30%, proximal LCx 20%, OM1 90% small vessel, mid RCA 60%. PTCA - FFR 0.86, PDA 40%. She had done well with her Afib since her DCCV in 04/2014 up until 04/2015 when she developed dyspnea, fatigue, and palpitations and was found to be in Afib. Her amiodarone was increased to 400 mg bid for a wekk in an effort to chemically convert her, but when it didn't she was decreased to 200 mg bid. There were discussing of possible DCCV. Unfortuantely, she suffered a fall in late December leading to a rib fracture on her right side. After this she was reluctant to move forward with any procedures. Since December she delt with progressive weight gain, DOE, and lower extremity edema. Her PCP was titrating her Lasix from 40 mg daily to 40 mg bid (1/26) to 80 mg in the morning and 40 mg in the PM (2/28). She continued to be symptomatic with the above despite titration. She was admitted 3/1 for volume overload and diuresis. BNP upon admission of 316. Troponin negative x 1. CXR with cardiomegaly without acute disease. Hgb 9.9 and remained stable. EKG with  rate controlled Afib. During her admission she diuresed well, minus 5.79 liters for the admission with a weight reduction of 3 pounds. She was discharged on torsemide 20 mg bid. She underwent successful DCCV on 07/25/2015. She was discharged on ELiquis 5 mg bid and amiodarone 200 mg daily. She refused SNF upon discharge.   She reports taking amiodarone 200 mg bid since her discharge rather than 200 mg daily which she was discharged on. Her heart rate at home has ranged from the 40's to 50's. Some fatigue. No presyncope or syncope. Weight has been stable. No lower extremity edema, orthopnea, PND, or early satiety. No palpitations. She not missed any doses of Eliquis. She remains on torsemide, no potassium repletion. Weight at home has been stable, though she is not weighing herself daily. Last weight at PCP office was 211, today her weight is 210. She is not on oxygen at this time at her visit.     Past Medical History  Diagnosis Date  . DM (diabetes mellitus) (HCC)   . PAF (paroxysmal atrial fibrillation) (HCC)     a. s/p dccv 04/2014;  b. amio/eliquis;  c. 06/2013 bb/ccb d/c 2/2 symptomatic bradycardia; d. 04/2015 recurrent AF->amio increased/bb resumed.  . Dyslipidemia   . CAD (coronary artery disease)     a. 2000 s/p PCI RCA;  b. 2005 PCI of RCA 2/2 ISR; c. 04/2013 Neg MV;  d. 05/2013 Cath: LM 20, LAD 30p, LCX 20p, OM1 90 small, RCA 2m (  PTCA - FFR 0.86), PDA 40.  . Osteoarthritis   . Hypothyroidism     hx  . Rheumatoid arthritis(714.0)   . Obesity   . GERD (gastroesophageal reflux disease)   . Shoulder fracture, left     a. Dr. Martha Clan  . Cerebral infarction (HCC)   . CKD (chronic kidney disease), stage III   . Asthma   . Chronic diastolic CHF (congestive heart failure) (HCC)     a. 10/2013 Echo: EF 60-65%, no rwma, Gr1 DD, mild MR, mildly dil LA.  Marland Kitchen Hypertensive heart disease   . Right rib fracture     a. 04/2015.    Past Surgical History  Procedure Laterality Date  . Rca stent  placement  2000  . Restenosis with ptca placement  2005  . Cataract surgery    . Partial hysterectomy    . Replacement total knee  2013    right  . Shoulder surgery  02/04/13  . Cardiac catheterization  06/11/2013  . Coronary angioplasty    . Electrophysiologic study N/A 07/25/2015    Procedure: CARDIOVERSION;  Surgeon: Antonieta Iba, MD;  Location: [facility] ORS;  Service: Cardiovascular;  Laterality: N/A;    Current Outpatient Prescriptions  Medication Sig Dispense Refill  . albuterol (PROVENTIL HFA;VENTOLIN HFA) 108 (90 Base) MCG/ACT inhaler Inhale 2 puffs into the lungs every 6 (six) hours as needed for wheezing or shortness of breath. 1 Inhaler 0  . ALPRAZolam (XANAX) 0.25 MG tablet Take 1 tablet (0.25 mg total) by mouth 2 (two) times daily as needed for anxiety. 30 tablet 0  . amiodarone (PACERONE) 200 MG tablet Take 1 tablet (200 mg total) by mouth daily. 30 tablet 3  . amLODipine (NORVASC) 10 MG tablet Take 1 tablet (10 mg total) by mouth daily. 30 tablet 5  . apixaban (ELIQUIS) 5 MG TABS tablet Take 1 tablet (5 mg total) by mouth 2 (two) times daily. 60 tablet 0  . atorvastatin (LIPITOR) 20 MG tablet Take 1 tablet (20 mg total) by mouth daily. 90 tablet 3  . ferrous sulfate 325 (65 FE) MG tablet Take 325 mg by mouth daily with breakfast.    . fluticasone (FLONASE) 50 MCG/ACT nasal spray Place 2 sprays into both nostrils daily as needed for rhinitis.    Marland Kitchen gabapentin (NEURONTIN) 300 MG capsule Take 1 capsule (300 mg total) by mouth 3 (three) times daily. 90 capsule 1  . insulin aspart (NOVOLOG) 100 UNIT/ML injection Inject 2-10 Units into the skin 3 (three) times daily with meals as needed for high blood sugar. Pt uses per sliding scale:  140-199:  2 units  200-250:  4 units  251-299:  6 units  300-349:  8 units  Greater than 350: 10 units 10 mL 1  . insulin glargine (LANTUS) 100 UNIT/ML injection Inject 0.18 mLs (18 Units total) into the skin daily. 10 mL 11  . latanoprost (XALATAN)  0.005 % ophthalmic solution Place 1 drop into both eyes at bedtime.     Marland Kitchen levothyroxine (SYNTHROID, LEVOTHROID) 150 MCG tablet Take 150 mcg by mouth daily before breakfast.    . lisinopril (PRINIVIL,ZESTRIL) 10 MG tablet Take 1 tablet (10 mg total) by mouth daily. 90 tablet 3  . metFORMIN (GLUCOPHAGE) 500 MG tablet Take 500 mg by mouth 2 (two) times daily with a meal.    . nitroGLYCERIN (NITROSTAT) 0.4 MG SL tablet Place 0.4 mg under the tongue every 5 (five) minutes as needed for chest pain.    Marland Kitchen  omeprazole (PRILOSEC) 20 MG capsule Take 1 capsule (20 mg total) by mouth 2 (two) times daily. 180 capsule 3  . ondansetron (ZOFRAN-ODT) 4 MG disintegrating tablet Take 1 tablet (4 mg total) by mouth every 8 (eight) hours as needed for nausea or vomiting. 20 tablet 0  . sertraline (ZOLOFT) 50 MG tablet Take 1 tablet (50 mg total) by mouth daily. 90 tablet 0  . torsemide (DEMADEX) 20 MG tablet Take 1 tablet (20 mg total) by mouth 2 (two) times daily. 60 tablet 0  . traMADol (ULTRAM) 50 MG tablet Take 2 tablets (100 mg total) by mouth every 8 (eight) hours as needed for moderate pain. 30 tablet 2   No current facility-administered medications for this visit.    Allergies:   Oxycodone   Social History:  The patient  reports that she has never smoked. She has never used smokeless tobacco. She reports that she does not drink alcohol or use illicit drugs.   Family History:  The patient's family history includes Cancer in her mother; Diabetes in her mother; Esophageal cancer in her mother.  ROS:   Review of Systems  Constitutional: Positive for malaise/fatigue. Negative for fever, chills, weight loss and diaphoresis.  HENT: Negative for congestion.   Eyes: Negative for discharge and redness.  Respiratory: Negative for cough, hemoptysis, sputum production, shortness of breath and wheezing.   Cardiovascular: Negative for chest pain, palpitations, orthopnea, claudication, leg swelling and PND.    Gastrointestinal: Negative for nausea, vomiting and abdominal pain.  Musculoskeletal: Negative for myalgias and falls.  Skin: Negative for rash.  Neurological: Negative for dizziness, tingling, tremors, sensory change, speech change, focal weakness, loss of consciousness and weakness.  Endo/Heme/Allergies: Does not bruise/bleed easily.  Psychiatric/Behavioral: The patient is not nervous/anxious.      PHYSICAL EXAM:  VS:  BP 126/40 mmHg  Pulse 46  Ht 5\' 3"  (1.6 m)  Wt 210 lb 8 oz (95.482 kg)  BMI 37.30 kg/m2 BMI: Body mass index is 37.3 kg/(m^2). Well nourished, well developed, in no acute distress HEENT: normocephalic, atraumatic Neck: no JVD, carotid bruits or masses Cardiac: bradycardic, S1, S2; RRR; no murmurs, rubs, or gallops Lungs:  clear to auscultation bilaterally, no wheezing, rhonchi or rales Abd: obese, soft, nontender, no hepatomegaly, + BS MS: no deformity or atrophy Ext: no edema Skin: warm and dry, no rash Neuro:  moves all extremities spontaneously, no focal abnormalities noted, follows commands Psych: euthymic mood, full affect   EKG:  Was ordered today. Shows marked sinus bradycardia, 46 bpm, left axis deviation, poor R wave progression, nonspecific lateral st/t changes  Recent Labs: 04/06/2015: TSH 0.51 07/18/2015: Pro B Natriuretic peptide (BNP) 321.0* 07/19/2015: B Natriuretic Peptide 316.0* 07/26/2015: Hemoglobin 9.4*; Platelets 276 08/04/2015: ALT 16; BUN 24*; Creatinine, Ser 1.24*; Potassium 4.0; Sodium 142  04/06/2015: Cholesterol 101; HDL 37.20*; LDL Cholesterol 46; Total CHOL/HDL Ratio 3; Triglycerides 88.0; VLDL 17.6   Estimated Creatinine Clearance: 41.1 mL/min (by C-G formula based on Cr of 1.24).   Wt Readings from Last 3 Encounters:  08/15/15 210 lb 8 oz (95.482 kg)  08/04/15 211 lb 2 oz (95.766 kg)  07/27/15 222 lb 2.5 oz (100.77 kg)     Other studies reviewed: Additional studies/records reviewed today include: summarized above  ASSESSMENT  AND PLAN:  1. Chronic diastolic CHF: She does not appear to be volume overloaded at this time. Continue torsemide 20 mg bid. Check bmet to evaluate renal function and potassium. Not on beta blocker given her bradycardia. Limit  PO salt and fluid consumption. Weigh daily and call for weight gain of > 3 pounds overnight or > 5 pounds in a week.   2. Asymptomatic bradycardia: She has been taking amiodarone 200 mg bid rather than her prescribed 200 mg daily. She was previously on 100 mg daily because of issues with bradycardia on amiodarone. Advised patient to decrease her amiodarone dose to 200 mg daily. Should her bradycardia persist would decrease dose further to 100 mg daily. Given this history and her asymptomatic nature at this time there is no indication currently for PPM.   3. Persistent Afib s/p DCCV as above: Maintaining sinus rhythm with bradycardic rates as above. Decrease amiodarone as above. Unfortunately, with the decreasing of her amiodarone this does increase the possibility of her going back into Afib. Risks were discussed with her. Continue Eliquis 5 mg bid. Check bmet to evaluate renal function. CHADS2VASc 9 (CHF, HTN, age x 2, DM, stroke x 2, vascular disease, sex category).   4. CAD s/p PCI as above: No symptoms of angina currently. On Eliquis in place of aspirin. Not on beta blocker at this time given her bradycardia as above. No plans for ischemic evaluation at this time.   5. Chronic respiratory failure on home oxygen: Stable.   6. Hypertensive heart disease: Blood pressure well controlled on current regimen. Continue current medications.   7. CKD stage III: Check bmet. Limit nephrotoxic agents.   8. Prior stroke: Stable. On Eliquis.  9. HLD: On Lipitor.   10. IDDM: Reports issues with hypoglycemia. Per PCP.   11. Dispo: She is deconditioned at baseline. She would likely benefit from PT/OT, though wants to be independent.   Disposition: F/u with Dr. Mariah Milling or myself in 1  month   Current medicines are reviewed at length with the patient today.  The patient did not have any concerns regarding medicines.  Elinor Dodge PA-C 08/15/2015 3:07 PM     [facility] [address] [place], [place] [address] [phone]

## 2015-08-15 NOTE — Patient Instructions (Addendum)
Medication Instructions:  Your physician has recommended you make the following change in your medication:  DECREASE amiodarone to 200mg  daily   Labwork: BMET  Testing/Procedures: none  Follow-Up: Your physician recommends that you schedule a follow-up appointment in: one month with Dr. or Mariah Milling, PA-C   Any Other Special Instructions Will Be Listed Below (If Applicable).     If you need a refill on your cardiac medications before your next appointment, please call your pharmacy.

## 2015-08-16 ENCOUNTER — Telehealth: Payer: Self-pay | Admitting: Cardiovascular Disease

## 2015-08-16 LAB — BASIC METABOLIC PANEL WITH GFR
BUN/Creatinine Ratio: 20 (ref 11–26)
BUN: 27 mg/dL (ref 8–27)
CO2: 19 mmol/L (ref 18–29)
Calcium: 9.3 mg/dL (ref 8.7–10.3)
Chloride: 99 mmol/L (ref 96–106)
Creatinine, Ser: 1.35 mg/dL — ABNORMAL HIGH (ref 0.57–1.00)
GFR calc Af Amer: 43 mL/min/1.73 — ABNORMAL LOW
GFR calc non Af Amer: 38 mL/min/1.73 — ABNORMAL LOW
Glucose: 233 mg/dL — ABNORMAL HIGH (ref 65–99)
Potassium: 5 mmol/L (ref 3.5–5.2)
Sodium: 139 mmol/L (ref 134–144)

## 2015-08-16 NOTE — Telephone Encounter (Signed)
[facility] [name] [name] calling stating pt told them she is no longer needing her Oxygen Please advise Call [phone] ex [phone]

## 2015-08-16 NOTE — Telephone Encounter (Signed)
Mr. Despina Hick states that patient had called him and stated that she no longer needed her oxygen. She was seen yesterday in the clinic but there was no documented pulse oximetry. He stated that he would talk with her and check with whoever ordered it.

## 2015-08-21 ENCOUNTER — Telehealth: Payer: Self-pay | Admitting: *Deleted

## 2015-08-21 NOTE — Telephone Encounter (Signed)
[name] [name] from [place] wanted to [facility] the provider that the patient blood pressure today was 170/60 and blood sugar was 375. She would like to be advised, about the patients blood sugar.

## 2015-08-22 NOTE — Telephone Encounter (Signed)
I have attempted to call this patient, but the only number in the chart is not working. Just wanted you to see the message I will attempt to reach her another way. thanks

## 2015-08-22 NOTE — Telephone Encounter (Signed)
Kathie Rhodes from [place] called returning your call. Call [facility] @ [phone]. Thank you!

## 2015-08-22 NOTE — Telephone Encounter (Signed)
Spoke with Kathie Rhodes again from [place].  Patient is taking medications as prescribed, but she is very stressed right now as she is preparing to move in with her daughter so she is packing her house right now.  Kathie Rhodes thinks this is why her numbers are not good.  I will attempt to call the patient at the number that betty provided.   [phone].  Blood sugar, per the patient is high, no number provided to me.   Checked BP while I was on the phone with her 159/61  Heart rate 57.   Sounds short of breath on the phone due to bending quite a bit today.

## 2015-08-22 NOTE — Telephone Encounter (Signed)
Note continued..  She is taking her BP as prescribed, the amiodarone has been cut in half from the cardiologist.  I had her check her Blood sugar while on the phone and it was 376, hasn't eaten anything but breakfast today, she was going to give herself her injection.  I reminded her to check her BP tonight and Blood sugar again to make sure she is eating and covering when needed per her medication regimen.  Please advise.   Kenney Houseman

## 2015-08-22 NOTE — Telephone Encounter (Signed)
Need to determine if she is taking her medication as prescribed.

## 2015-08-22 NOTE — Telephone Encounter (Signed)
Tried an additional number, left a VM to return my call.

## 2015-08-23 NOTE — Telephone Encounter (Signed)
Spoke with patient and scheduled for next Tuesday the 11th at 2pm.  Thanks

## 2015-08-23 NOTE — Telephone Encounter (Signed)
Needs follow up. Can use visit next Tuesday afternoon.

## 2015-08-28 ENCOUNTER — Telehealth: Payer: Self-pay | Admitting: Internal Medicine

## 2015-08-28 NOTE — Telephone Encounter (Signed)
Pt called about not needing the Oxygen tank anymore and pt is moving this weekend. Pt would like to have [facility] pick it up. Call pt @ [phone]. Thank you!

## 2015-08-28 NOTE — Telephone Encounter (Signed)
Spoke with the patient.  She has not used the oxygen in over two weeks and she is moving this weekend, so she wants the oxygen tank picked up.  She doesn't want to pay for something she isn't using, She has a follow up appt tomorrow with you.  I explained that you could discuss this then with her and then we can have [facility] it [facility].

## 2015-08-29 ENCOUNTER — Ambulatory Visit: Payer: Medicare Other | Admitting: Internal Medicine

## 2015-08-29 ENCOUNTER — Telehealth: Payer: Self-pay | Admitting: Internal Medicine

## 2015-08-29 ENCOUNTER — Telehealth: Payer: Self-pay | Admitting: *Deleted

## 2015-08-29 ENCOUNTER — Other Ambulatory Visit: Payer: Self-pay | Admitting: *Deleted

## 2015-08-29 DIAGNOSIS — Z0289 Encounter for other administrative examinations: Secondary | ICD-10-CM

## 2015-08-29 NOTE — Telephone Encounter (Signed)
What time would be best for her to be scheduled? Thanks there is a 830 or 330?

## 2015-08-29 NOTE — Telephone Encounter (Signed)
Shanda Bumps please advise, thanks

## 2015-08-29 NOTE — Telephone Encounter (Signed)
Patient had to cancel her appt, she doesn't have transportation. She would like to be worked in [employer] account [employer]. Please advise  Pt Contact [phone]

## 2015-08-29 NOTE — Telephone Encounter (Signed)
Pt called wanting to get a Rx refill on her medication of levothyroxine (SYNTHROID, LEVOTHROID) 150 MCG tablet. Pharmacy is [facility] [address] [place], [place] - [address]. Call pt @ [phone]. Thank you!

## 2015-08-29 NOTE — Telephone Encounter (Signed)
8:30am please thanks

## 2015-08-30 ENCOUNTER — Encounter: Payer: Self-pay | Admitting: Internal Medicine

## 2015-08-30 ENCOUNTER — Ambulatory Visit (INDEPENDENT_AMBULATORY_CARE_PROVIDER_SITE_OTHER): Payer: Medicare Other | Admitting: Internal Medicine

## 2015-08-30 VITALS — BP 144/54 | HR 47 | Temp 98.2°F | Ht 63.0 in | Wt 209.5 lb

## 2015-08-30 DIAGNOSIS — I4819 Other persistent atrial fibrillation: Secondary | ICD-10-CM

## 2015-08-30 DIAGNOSIS — E785 Hyperlipidemia, unspecified: Secondary | ICD-10-CM

## 2015-08-30 DIAGNOSIS — E1159 Type 2 diabetes mellitus with other circulatory complications: Secondary | ICD-10-CM | POA: Diagnosis not present

## 2015-08-30 DIAGNOSIS — E1165 Type 2 diabetes mellitus with hyperglycemia: Secondary | ICD-10-CM

## 2015-08-30 DIAGNOSIS — E039 Hypothyroidism, unspecified: Secondary | ICD-10-CM | POA: Diagnosis not present

## 2015-08-30 DIAGNOSIS — I158 Other secondary hypertension: Secondary | ICD-10-CM

## 2015-08-30 DIAGNOSIS — I481 Persistent atrial fibrillation: Secondary | ICD-10-CM | POA: Diagnosis not present

## 2015-08-30 LAB — COMPREHENSIVE METABOLIC PANEL
ALT: 15 U/L (ref 0–35)
AST: 19 U/L (ref 0–37)
Albumin: 3.8 g/dL (ref 3.5–5.2)
Alkaline Phosphatase: 90 U/L (ref 39–117)
BUN: 29 mg/dL — ABNORMAL HIGH (ref 6–23)
CALCIUM: 9.8 mg/dL (ref 8.4–10.5)
CHLORIDE: 101 meq/L (ref 96–112)
CO2: 31 meq/L (ref 19–32)
CREATININE: 1.38 mg/dL — AB (ref 0.40–1.20)
GFR: 39.26 mL/min — ABNORMAL LOW (ref >60.00)
Glucose, Bld: 126 mg/dL — ABNORMAL HIGH (ref 70–99)
Potassium: 3.6 mEq/L (ref 3.5–5.1)
SODIUM: 140 meq/L (ref 135–145)
Total Bilirubin: 0.5 mg/dL (ref 0.2–1.2)
Total Protein: 7.4 g/dL (ref 6.0–8.3)

## 2015-08-30 LAB — TSH: TSH: 2.48 u[IU]/mL (ref 0.35–4.50)

## 2015-08-30 NOTE — Assessment & Plan Note (Signed)
BP Readings from Last 3 Encounters:  08/30/15 144/54  08/15/15 126/40  08/04/15 131/48   BP well controlled. Continue current medication.

## 2015-08-30 NOTE — Progress Notes (Signed)
Pre visit review using our clinic review tool, if applicable. No additional management support is needed unless otherwise documented below in the visit note. 

## 2015-08-30 NOTE — Progress Notes (Signed)
Subjective:    Patient ID: [name], female    DOB: 06-29-1934, 81 y.o.   MRN: [id number]  HPI  81YO female presents for follow up of diabetes.  DM - BG running near 150. No BG over 250. A few below 70. Compliant with medication.  Planning to move in with her daughter.  Breathing has been better. Oxygen removed this morning from home. Has not used recently. No recent palpitations or chest pain.   Lab Results  Component Value Date   HGBA1C 9.7* 07/07/2015     Wt Readings from Last 3 Encounters:  08/30/15 209 lb 8 oz (95.029 kg)  08/15/15 210 lb 8 oz (95.482 kg)  08/04/15 211 lb 2 oz (95.766 kg)   BP Readings from Last 3 Encounters:  08/30/15 144/54  08/15/15 126/40  08/04/15 131/48    Past Medical History  Diagnosis Date  . DM (diabetes mellitus) (HCC)   . PAF (paroxysmal atrial fibrillation) (HCC)     a. s/p dccv 04/2014;  b. amio/eliquis;  c. 06/2013 bb/ccb d/c 2/2 symptomatic bradycardia; d. 04/2015 recurrent AF->amio increased/bb resumed.  . Dyslipidemia   . CAD (coronary artery disease)     a. 2000 s/p PCI RCA;  b. 2005 PCI of RCA 2/2 ISR; c. 04/2013 Neg MV;  d. 05/2013 Cath: LM 20, LAD 30p, LCX 20p, OM1 90 small, RCA 34m (PTCA - FFR 0.86), PDA 40.  . Osteoarthritis   . Hypothyroidism     hx  . Rheumatoid arthritis(714.0)   . Obesity   . GERD (gastroesophageal reflux disease)   . Shoulder fracture, left     a. Dr. Martha Clan  . Cerebral infarction (HCC)   . CKD (chronic kidney disease), stage III   . Asthma   . Chronic diastolic CHF (congestive heart failure) (HCC)     a. 10/2013 Echo: EF 60-65%, no rwma, Gr1 DD, mild MR, mildly dil LA.  Marland Kitchen Hypertensive heart disease   . Right rib fracture     a. 04/2015.   Family History  Problem Relation Age of Onset  . Esophageal cancer Mother   . Diabetes Mother   . Cancer Mother     esophageal    Past Surgical History  Procedure Laterality Date  . Rca stent placement  2000  . Restenosis with ptca  placement  2005  . Cataract surgery    . Partial hysterectomy    . Replacement total knee  2013    right  . Shoulder surgery  02/04/13  . Cardiac catheterization  06/11/2013  . Coronary angioplasty    . Electrophysiologic study N/A 07/25/2015    Procedure: CARDIOVERSION;  Surgeon: Antonieta Iba, MD;  Location: [facility] ORS;  Service: Cardiovascular;  Laterality: N/A;   Social History   Social History  . Marital Status: Married    Spouse Name: N/A  . Number of Children: 3  . Years of Education: N/A   Occupational History  . retired    Social History Main Topics  . Smoking status: Never Smoker   . Smokeless tobacco: Never Used     Comment: former passive smoker  . Alcohol Use: No  . Drug Use: No  . Sexual Activity: No   Other Topics Concern  . None   Social History Narrative   Widow; lives in [place]. Three children. 4 grandchildren; 4 great grandchildren   Regular exercise: yes   Caffeine use: tea occasionally    Review of Systems  Constitutional: Negative for fever,  chills, appetite change, fatigue and unexpected weight change.  Eyes: Negative for visual disturbance.  Respiratory: Negative for cough and shortness of breath.   Cardiovascular: Negative for chest pain, palpitations and leg swelling.  Gastrointestinal: Negative for nausea, vomiting, abdominal pain, diarrhea and constipation.  Skin: Negative for color change and rash.  Hematological: Negative for adenopathy. Does not bruise/bleed easily.  Psychiatric/Behavioral: Negative for sleep disturbance and dysphoric mood. The patient is not nervous/anxious.        Objective:    BP 144/54 mmHg  Pulse 47  Temp(Src) 98.2 F (36.8 C) (Oral)  Ht 5\' 3"  (1.6 m)  Wt 209 lb 8 oz (95.029 kg)  BMI 37.12 kg/m2  SpO2 95% Physical Exam  Constitutional: She is oriented to person, place, and time. She appears well-developed and well-nourished. No distress.  HENT:  Head: Normocephalic and atraumatic.  Right Ear: External  ear normal.  Left Ear: External ear normal.  Nose: Nose normal.  Mouth/Throat: Oropharynx is clear and moist. No oropharyngeal exudate.  Eyes: Conjunctivae are normal. Pupils are equal, round, and reactive to light. Right eye exhibits no discharge. Left eye exhibits no discharge. No scleral icterus.  Neck: Normal range of motion. Neck supple. No tracheal deviation present. No thyromegaly present.  Cardiovascular: Normal rate, regular rhythm, normal heart sounds and intact distal pulses.  Exam reveals no gallop and no friction rub.   No murmur heard. Pulmonary/Chest: Effort normal and breath sounds normal. No respiratory distress. She has no wheezes. She has no rales. She exhibits no tenderness.  Musculoskeletal: Normal range of motion. She exhibits no edema or tenderness.  Lymphadenopathy:    She has no cervical adenopathy.  Neurological: She is alert and oriented to person, place, and time. No cranial nerve deficit. She exhibits normal muscle tone. Coordination normal.  Skin: Skin is warm and dry. No rash noted. She is not diaphoretic. No erythema. No pallor.  Psychiatric: She has a normal mood and affect. Her behavior is normal. Judgment and thought content normal.          Assessment & Plan:   Problem List Items Addressed This Visit      High   Hyperlipidemia    Will check LFTs with labs. Continue Atrovastatin.      Hypertension    BP Readings from Last 3 Encounters:  08/30/15 144/54  08/15/15 126/40  08/04/15 131/48   BP well controlled. Continue current medication.      Hypothyroidism    Will check TSH with labs.      Relevant Orders   TSH   Poorly controlled type 2 diabetes mellitus with circulatory disorder (HCC) - Primary (Chronic)    BG improved by report. Continue current medications. Reviewed dietary intake and encouraged her to eat smaller meals with protein. Repeat A1c in 09/2015      Relevant Orders   Comprehensive metabolic panel     Unprioritized    Atrial fibrillation (HCC)       Return in about 4 weeks (around 09/27/2015) for Recheck.  11/27/2015, MD Internal Medicine [facility]

## 2015-08-30 NOTE — Assessment & Plan Note (Signed)
BG improved by report. Continue current medications. Reviewed dietary intake and encouraged her to eat smaller meals with protein. Repeat A1c in 09/2015

## 2015-08-30 NOTE — Assessment & Plan Note (Signed)
Will check LFTs with labs. Continue Atrovastatin. 

## 2015-08-30 NOTE — Assessment & Plan Note (Signed)
Will check TSH with labs. 

## 2015-08-30 NOTE — Patient Instructions (Signed)
Labs today to recheck kidney function.  Continue current medication.  Follow up in 4 weeks.

## 2015-08-31 ENCOUNTER — Other Ambulatory Visit: Payer: Self-pay | Admitting: *Deleted

## 2015-08-31 MED ORDER — LEVOTHYROXINE SODIUM 150 MCG PO TABS: 150.0000 ug | ORAL_TABLET | Freq: Every day | ORAL | Status: AC

## 2015-08-31 MED ORDER — ATORVASTATIN CALCIUM 20 MG PO TABS: 20.0000 mg | ORAL_TABLET | Freq: Every day | ORAL | Status: DC

## 2015-08-31 NOTE — Telephone Encounter (Signed)
Medications have been sent to pharmacy 

## 2015-08-31 NOTE — Telephone Encounter (Signed)
Pt called today again about her medication being refilled. It has not been refilled and she is out. Pt is also requesting a refill on her atorvastatin (LIPITOR) 20 MG tablet. Please send to [facility] pharmacy.

## 2015-09-07 ENCOUNTER — Other Ambulatory Visit: Payer: Self-pay

## 2015-09-07 MED ORDER — APIXABAN 5 MG PO TABS: 5.0000 mg | ORAL_TABLET | Freq: Two times a day (BID) | ORAL | Status: DC

## 2015-09-07 NOTE — Telephone Encounter (Signed)
Please advise, refill has not been by Dr. Dan Humphreys in the past thanks.

## 2015-09-08 NOTE — Telephone Encounter (Signed)
Faxed

## 2015-09-18 ENCOUNTER — Telehealth: Payer: Self-pay | Admitting: Internal Medicine

## 2015-09-18 DIAGNOSIS — K219 Gastro-esophageal reflux disease without esophagitis: Secondary | ICD-10-CM

## 2015-09-18 DIAGNOSIS — G629 Polyneuropathy, unspecified: Secondary | ICD-10-CM

## 2015-09-18 NOTE — Telephone Encounter (Signed)
Caller name: Glendora Clouatre Relationship to patient:  patient Can be reached: [phone]  Reason for call: pt needs refills on Xanax, Eliquis, Tramadol, Norvasc, Demadex, Prilosec, Gabapentin . Send to [facility].

## 2015-09-18 NOTE — Telephone Encounter (Signed)
Need to clarify with the patient, which pharmacy she wants to have these sent to as most of them were done in the past month.  I have attempted to call her, unable to leave a message thanks.

## 2015-09-20 ENCOUNTER — Ambulatory Visit: Payer: Medicare Other | Admitting: Cardiovascular Disease

## 2015-09-20 ENCOUNTER — Encounter: Payer: Self-pay | Admitting: *Deleted

## 2015-09-27 ENCOUNTER — Ambulatory Visit: Payer: Medicare Other | Admitting: Cardiovascular Disease

## 2015-09-27 ENCOUNTER — Encounter: Payer: Self-pay | Admitting: *Deleted

## 2015-10-05 ENCOUNTER — Other Ambulatory Visit: Payer: Self-pay

## 2015-10-05 DIAGNOSIS — G629 Polyneuropathy, unspecified: Secondary | ICD-10-CM

## 2015-10-05 DIAGNOSIS — K219 Gastro-esophageal reflux disease without esophagitis: Secondary | ICD-10-CM

## 2015-10-05 MED ORDER — TRAMADOL HCL 50 MG PO TABS: 100.0000 mg | ORAL_TABLET | Freq: Three times a day (TID) | ORAL | Status: DC | PRN

## 2015-10-05 MED ORDER — AMLODIPINE BESYLATE 10 MG PO TABS: 10.0000 mg | ORAL_TABLET | Freq: Every day | ORAL | Status: DC

## 2015-10-05 MED ORDER — GABAPENTIN 300 MG PO CAPS: 300.0000 mg | ORAL_CAPSULE | Freq: Three times a day (TID) | ORAL | Status: DC

## 2015-10-05 MED ORDER — OMEPRAZOLE 20 MG PO CPDR: 20.0000 mg | DELAYED_RELEASE_CAPSULE | Freq: Two times a day (BID) | ORAL | Status: DC

## 2015-10-05 MED ORDER — TORSEMIDE 20 MG PO TABS: 20.0000 mg | ORAL_TABLET | Freq: Two times a day (BID) | ORAL | Status: DC

## 2015-10-05 MED ORDER — APIXABAN 5 MG PO TABS: 5.0000 mg | ORAL_TABLET | Freq: Two times a day (BID) | ORAL | Status: DC

## 2015-10-06 ENCOUNTER — Telehealth: Payer: Self-pay | Admitting: Internal Medicine

## 2015-10-06 NOTE — Telephone Encounter (Signed)
I would schedule sooner if able.

## 2015-10-06 NOTE — Telephone Encounter (Signed)
Pt is having knee surgery on July 11th. Surgeon wants pt's sugar under control for surgery. Pt has an appt with Dr. Dan Humphreys on June 2nd. She should wait until then or should she see Dr. Dan Humphreys sooner. Readings around betweem 500-300. Pt said she is out pills.

## 2015-10-09 NOTE — Telephone Encounter (Signed)
Dr. Dan Humphreys does not have any a office visit available, need to know if she wants to work pt in and on what day.

## 2015-10-09 NOTE — Telephone Encounter (Signed)
Spoke with patient regarding knee surgery.  She is schedule to discuss diabetes with Dr Dan Humphreys.  Advised patient to bring a list of foods she eats and when she takes her insulin.  Patient says she is no longer taking metformin.  Advised her to bring her medications as well.

## 2015-10-10 ENCOUNTER — Encounter: Payer: Self-pay | Admitting: Internal Medicine

## 2015-10-10 ENCOUNTER — Ambulatory Visit (INDEPENDENT_AMBULATORY_CARE_PROVIDER_SITE_OTHER): Payer: Medicare Other | Admitting: Internal Medicine

## 2015-10-10 VITALS — BP 168/78 | HR 68 | Ht 63.0 in | Wt 206.0 lb

## 2015-10-10 DIAGNOSIS — I158 Other secondary hypertension: Secondary | ICD-10-CM | POA: Diagnosis not present

## 2015-10-10 DIAGNOSIS — K219 Gastro-esophageal reflux disease without esophagitis: Secondary | ICD-10-CM

## 2015-10-10 DIAGNOSIS — E1159 Type 2 diabetes mellitus with other circulatory complications: Secondary | ICD-10-CM

## 2015-10-10 DIAGNOSIS — G629 Polyneuropathy, unspecified: Secondary | ICD-10-CM

## 2015-10-10 DIAGNOSIS — E1165 Type 2 diabetes mellitus with hyperglycemia: Secondary | ICD-10-CM | POA: Diagnosis not present

## 2015-10-10 DIAGNOSIS — F411 Generalized anxiety disorder: Secondary | ICD-10-CM | POA: Diagnosis not present

## 2015-10-10 DIAGNOSIS — M1712 Unilateral primary osteoarthritis, left knee: Secondary | ICD-10-CM

## 2015-10-10 LAB — LIPID PANEL
CHOL/HDL RATIO: 4
Cholesterol: 185 mg/dL (ref 0–200)
HDL: 47.4 mg/dL (ref >39.00)
LDL Cholesterol: 108 mg/dL — ABNORMAL HIGH (ref 0–99)
NONHDL: 137.36
Triglycerides: 149 mg/dL (ref 0.0–149.0)
VLDL: 29.8 mg/dL (ref 0.0–40.0)

## 2015-10-10 LAB — COMPREHENSIVE METABOLIC PANEL
ALT: 11 U/L (ref 0–35)
AST: 13 U/L (ref 0–37)
Albumin: 3.9 g/dL (ref 3.5–5.2)
Alkaline Phosphatase: 105 U/L (ref 39–117)
BILIRUBIN TOTAL: 0.7 mg/dL (ref 0.2–1.2)
BUN: 23 mg/dL (ref 6–23)
CO2: 28 meq/L (ref 19–32)
CREATININE: 1.15 mg/dL (ref 0.40–1.20)
Calcium: 9.3 mg/dL (ref 8.4–10.5)
Chloride: 101 mEq/L (ref 96–112)
GFR: 48.44 mL/min — AB (ref >60.00)
GLUCOSE: 450 mg/dL — AB (ref 70–99)
Potassium: 4.6 mEq/L (ref 3.5–5.1)
Sodium: 135 mEq/L (ref 135–145)
TOTAL PROTEIN: 6.8 g/dL (ref 6.0–8.3)

## 2015-10-10 LAB — HEMOGLOBIN A1C: HEMOGLOBIN A1C: 10.5 % — AB (ref 4.6–6.5)

## 2015-10-10 MED ORDER — GABAPENTIN 300 MG PO CAPS: 300.0000 mg | ORAL_CAPSULE | Freq: Three times a day (TID) | ORAL | Status: DC

## 2015-10-10 MED ORDER — ALPRAZOLAM 0.25 MG PO TABS: 0.2500 mg | ORAL_TABLET | Freq: Two times a day (BID) | ORAL | Status: DC | PRN

## 2015-10-10 MED ORDER — OMEPRAZOLE 20 MG PO CPDR: 20.0000 mg | DELAYED_RELEASE_CAPSULE | Freq: Two times a day (BID) | ORAL | Status: DC
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
## 2015-10-10 NOTE — Assessment & Plan Note (Signed)
BG elevated recently. Will check A1c with labs. Will also recheck renal function. May consider trial back on Metformin based on renal function. Continue Lantus. Follow up in 3 months and prn.

## 2015-10-10 NOTE — Progress Notes (Signed)
Pre visit review using our clinic review tool, if applicable. No additional management support is needed unless otherwise documented below in the visit note. 

## 2015-10-10 NOTE — Patient Instructions (Signed)
Labs today.  Follow up in 3 months or sooner as needed. 

## 2015-10-10 NOTE — Assessment & Plan Note (Signed)
Worsening anxiety. Offered support today. Will continue prn alprazolam for extreme anxiety only. She understands the risks of this medication. Will also set up home health evaluation to evaluate social situation in home. Encouraged her to consider alternative living such as in ALF.

## 2015-10-10 NOTE — Assessment & Plan Note (Signed)
Given home situation with no support, recommended rehab stay after upcoming left knee replacement.

## 2015-10-10 NOTE — Assessment & Plan Note (Signed)
BP Readings from Last 3 Encounters:  10/10/15 168/78  08/30/15 144/54  08/15/15 126/40   BP elevated, however she is crying during visit. Will monitor for now. Continue current medications. Renal function with labs.

## 2015-10-11 ENCOUNTER — Telehealth: Payer: Self-pay

## 2015-10-11 ENCOUNTER — Telehealth: Payer: Self-pay | Admitting: *Deleted

## 2015-10-11 MED ORDER — FLUCONAZOLE 150 MG PO TABS: 150.0000 mg | ORAL_TABLET | Freq: Once | ORAL | Status: DC

## 2015-10-11 NOTE — Telephone Encounter (Signed)
Patient has requested to have her lab results from 05/23 She also stated that she forgot to report her rash on he bottom at er visit yesterday. The rash is itchy, and she stated that the rash has started to bleed from her scratching it.  [phone]

## 2015-10-11 NOTE — Telephone Encounter (Signed)
-----   Message from Shelia Media, MD sent at 10/11/2015  4:17 PM EDT ----- Labs show that blood sugars are extremely high. Has she been taking the Lantus consistently, 18units daily? If yes, please increase to 20units daily.

## 2015-10-11 NOTE — Telephone Encounter (Signed)
Labs are not back. Needs to be seen for rash

## 2015-10-11 NOTE — Telephone Encounter (Signed)
Patient aware of lab results and recommendations.  She also is complaining of vaginal itching and urgency/frequency for about a week or so.  She forgot to ask about these issues.  Per Dr Dan Humphreys diflucan sent to pharmacy.  Patient will call if this doesn't help.

## 2015-10-11 NOTE — Telephone Encounter (Signed)
Spoke to patient. Gave instructions per Dr. Tilman Neat previous note. Offered to schedule appt.  Patient said she would just call back.

## 2015-10-13 ENCOUNTER — Telehealth: Payer: Self-pay | Admitting: Internal Medicine

## 2015-10-13 NOTE — Telephone Encounter (Signed)
Please discontinue the diflucan. She can use OTC Monistat

## 2015-10-13 NOTE — Telephone Encounter (Signed)
[facility] called regarding medication of fluconazole (DIFLUCAN) 150 MG tablet as a drug interaction with amiodarone (PACERONE) 200 MG tablet.   [facility] - [place], [place] - [address] [phone]  Thank you!

## 2015-10-17 ENCOUNTER — Telehealth: Payer: Self-pay | Admitting: Internal Medicine

## 2015-10-17 ENCOUNTER — Telehealth: Payer: Self-pay | Admitting: *Deleted

## 2015-10-17 NOTE — Telephone Encounter (Signed)
Patient(Nurse Line) has requested to have her insulin needles called into the pharmacy.  Please read nurse line notes from today.

## 2015-10-17 NOTE — Telephone Encounter (Signed)
Pt called back stating she did not want to go to the ED. Nurse line suggested that she goes due to her blood sugar. Pt refused. I spoke with Kenney Houseman and was advised to tell pt to go to the ED. Thank you!

## 2015-10-17 NOTE — Telephone Encounter (Signed)
Called pharmacy to confirm, medication was discontinued. thanks

## 2015-10-17 NOTE — Telephone Encounter (Signed)
She needs to be evaluated in the ER now. She should call 911 for evaluation and transportation to the ER. Her blood sugars have been extremely high for quite some time. At her last visit, her A1c was over 10% and she was instructed to increase Lantus to 20units daily. It is not clear that she did this. Please encourage her to be evaluated in the ER.

## 2015-10-17 NOTE — Telephone Encounter (Signed)
°  Patient Name: [name] [name]  DOB: June 15, 1934    Initial Comment Caller states her blood sugar is 525, dizziness, is awaiting test results regarding diabetic meds   Nurse Assessment  Nurse: Renaldo Fiddler, RN, Raynelle Fanning Date/Time Lamount Cohen Time): 10/17/2015 10:40:55 AM  Confirm and document reason for call. If symptomatic, describe symptoms. You must click the next button to save text entered. ---Caller states her blood sugar is 525, she is dizzy and is awaiting results of blood work. She saw Dr. Dan Humphreys last week, is not on any meds at this time and her BS has been running above 500 for the last 2-3 weeks.  Has the patient traveled out of the country within the last 30 days? ---Not Applicable  Does the patient have any new or worsening symptoms? ---Yes  Will a triage be completed? ---Yes  Related visit to physician within the last 2 weeks? ---Yes  Does the PT have any chronic conditions? (i.e. diabetes, asthma, etc.) ---Yes  List chronic conditions. ---MI May/cardiac stents/ A fib , Diabetes, Htn, Arthritis, Asthma  Is this a behavioral health or substance abuse call? ---No     Guidelines    Guideline Title Affirmed Question Affirmed Notes  Diabetes - High Blood Sugar Acting confused (e.g., disoriented, slurred speech)    Final Disposition User   Call EMS 911 Now Renaldo Fiddler, RN, Raynelle Fanning    Comments  Caller refuses to call 911 and states she can not get in to the office or a pharmacy until later. She is tearful and anxious but states she only needs insulin. She does have Lantus but no needles. Advised that I would get a note back to the nurse and have someone call her back as apap.   Referrals  GO TO FACILITY REFUSED   Disagree/Comply: Disagree  Disagree/Comply Reason: Unable to find transportation

## 2015-10-17 NOTE — Telephone Encounter (Signed)
Pt called about her blood sugar being high at 525 for today. Pt states she was told to stop taking the metformin and ever since her blood sugar has been high. Pt was transferred to nurse line.  Pharmacy is [facility] - [place], [place] - [address]    Call pt @ [phone]. Thank you!

## 2015-10-17 NOTE — Telephone Encounter (Signed)
Called in insulin needles - box of 100 with 1 refill 30 gauge 0.62mm to [facility] in [place], [phone]. Pharmacist advised on what would be appropriate since there was no old order to go off of. Will verify with patient what her preference is when she gets there. Let patient know this had been done.

## 2015-10-17 NOTE — Telephone Encounter (Signed)
Spoke with patient regarding glucose levels.  Advised her she needed to go to ER.  Patient refused due to no transportation.  She says she has taken 20 units of lantus and her glucose has dropped to 3something.  Advised patient again she needs to be evaluated at the ER but she refused.

## 2015-10-20 ENCOUNTER — Encounter: Payer: Self-pay | Admitting: Internal Medicine

## 2015-10-20 ENCOUNTER — Ambulatory Visit (INDEPENDENT_AMBULATORY_CARE_PROVIDER_SITE_OTHER): Payer: Medicare Other | Admitting: Internal Medicine

## 2015-10-20 VITALS — BP 160/68 | HR 62 | Ht 62.0 in | Wt 209.2 lb

## 2015-10-20 DIAGNOSIS — E1165 Type 2 diabetes mellitus with hyperglycemia: Secondary | ICD-10-CM

## 2015-10-20 DIAGNOSIS — M1712 Unilateral primary osteoarthritis, left knee: Secondary | ICD-10-CM | POA: Diagnosis not present

## 2015-10-20 DIAGNOSIS — E1159 Type 2 diabetes mellitus with other circulatory complications: Secondary | ICD-10-CM | POA: Diagnosis not present

## 2015-10-20 DIAGNOSIS — K219 Gastro-esophageal reflux disease without esophagitis: Secondary | ICD-10-CM | POA: Diagnosis not present

## 2015-10-20 LAB — COMPREHENSIVE METABOLIC PANEL
ALT: 12 U/L (ref 0–35)
AST: 15 U/L (ref 0–37)
Albumin: 3.8 g/dL (ref 3.5–5.2)
Alkaline Phosphatase: 121 U/L — ABNORMAL HIGH (ref 39–117)
BILIRUBIN TOTAL: 0.5 mg/dL (ref 0.2–1.2)
BUN: 26 mg/dL — ABNORMAL HIGH (ref 6–23)
CALCIUM: 9.4 mg/dL (ref 8.4–10.5)
CHLORIDE: 106 meq/L (ref 96–112)
CO2: 25 meq/L (ref 19–32)
CREATININE: 1.09 mg/dL (ref 0.40–1.20)
GFR: 51.53 mL/min — ABNORMAL LOW (ref >60.00)
GLUCOSE: 240 mg/dL — AB (ref 70–99)
Potassium: 4.7 mEq/L (ref 3.5–5.1)
SODIUM: 140 meq/L (ref 135–145)
Total Protein: 7.1 g/dL (ref 6.0–8.3)

## 2015-10-20 MED ORDER — OMEPRAZOLE 20 MG PO CPDR: 20.0000 mg | DELAYED_RELEASE_CAPSULE | Freq: Two times a day (BID) | ORAL | Status: DC

## 2015-10-20 MED ORDER — APIXABAN 5 MG PO TABS: 5.0000 mg | ORAL_TABLET | Freq: Two times a day (BID) | ORAL | Status: DC

## 2015-10-20 MED ORDER — TRAMADOL HCL 50 MG PO TABS: 100.0000 mg | ORAL_TABLET | Freq: Three times a day (TID) | ORAL | Status: DC | PRN

## 2015-10-20 MED ORDER — METFORMIN HCL 500 MG PO TABS: 500.0000 mg | ORAL_TABLET | Freq: Two times a day (BID) | ORAL | Status: DC

## 2015-10-20 MED ORDER — INSULIN GLARGINE 100 UNIT/ML ~~LOC~~ SOLN: 25.0000 [IU] | Freq: Every day | SUBCUTANEOUS | Status: DC

## 2015-10-20 NOTE — Assessment & Plan Note (Signed)
BG elevated. Will increase Lantus to 25units daily. Add back Metformin, however we discussed potential risks and will plan to repeat renal function in 1 week. Follow up in 1 week.

## 2015-10-20 NOTE — Patient Instructions (Signed)
Increase Lantus to 25units daily.  Labs today.  Monitor blood sugars 2-3 times daily.  Follow up in 1-2 weeks.

## 2015-10-20 NOTE — Progress Notes (Signed)
Subjective:    Patient ID: [name] [name], female    DOB: 1934/09/26, 82 y.o.   MRN: [id number]  HPI  81YO female presents for follow up.  DM - BG recently very elevated. BG have been extremely high. 300s fasting. Taking insulin, but feels that Metformin was more helpful. Taking 20units of Lantus daily. Feels thirsty and is urinating frequently. Difficult home situation. Lack of access to healthy foods in home.  Worsening left knee pain. Planning for surgical intervention with knee replacement this month or next. Feels that knee will give away on her at times.  Using Tramadol with some improvement in pain. Knee is sometimes swollen, particularly in the lower knee.  Lab Results  Component Value Date   HGBA1C 10.5* 10/10/2015     Wt Readings from Last 3 Encounters:  10/20/15 209 lb 3.2 oz (94.892 kg)  10/10/15 206 lb (93.441 kg)  08/30/15 209 lb 8 oz (95.029 kg)   BP Readings from Last 3 Encounters:  10/20/15 160/68  10/10/15 168/78  08/30/15 144/54    Past Medical History  Diagnosis Date  . DM (diabetes mellitus) (HCC)   . PAF (paroxysmal atrial fibrillation) (HCC)     a. s/p dccv 04/2014;  b. amio/eliquis;  c. 06/2013 bb/ccb d/c 2/2 symptomatic bradycardia; d. 04/2015 recurrent AF->amio increased/bb resumed.  . Dyslipidemia   . CAD (coronary artery disease)     a. 2000 s/p PCI RCA;  b. 2005 PCI of RCA 2/2 ISR; c. 04/2013 Neg MV;  d. 05/2013 Cath: LM 20, LAD 30p, LCX 20p, OM1 90 small, RCA 71m (PTCA - FFR 0.86), PDA 40.  . Osteoarthritis   . Hypothyroidism     hx  . Rheumatoid arthritis(714.0)   . Obesity   . GERD (gastroesophageal reflux disease)   . Shoulder fracture, left     a. Dr. Martha Clan  . Cerebral infarction (HCC)   . CKD (chronic kidney disease), stage III   . Asthma   . Chronic diastolic CHF (congestive heart failure) (HCC)     a. 10/2013 Echo: EF 60-65%, no rwma, Gr1 DD, mild MR, mildly dil LA.  Marland Kitchen Hypertensive heart disease   . Right rib fracture       a. 04/2015.   Family History  Problem Relation Age of Onset  . Esophageal cancer Mother   . Diabetes Mother   . Cancer Mother     esophageal    Past Surgical History  Procedure Laterality Date  . Rca stent placement  2000  . Restenosis with ptca placement  2005  . Cataract surgery    . Partial hysterectomy    . Replacement total knee  2013    right  . Shoulder surgery  02/04/13  . Cardiac catheterization  06/11/2013  . Coronary angioplasty    . Electrophysiologic study N/A 07/25/2015    Procedure: CARDIOVERSION;  Surgeon: Antonieta Iba, MD;  Location: [facility] ORS;  Service: Cardiovascular;  Laterality: N/A;   Social History   Social History  . Marital Status: Married    Spouse Name: N/A  . Number of Children: 3  . Years of Education: N/A   Occupational History  . retired    Social History Main Topics  . Smoking status: Never Smoker   . Smokeless tobacco: Never Used     Comment: former passive smoker  . Alcohol Use: No  . Drug Use: No  . Sexual Activity: No   Other Topics Concern  . None   Social  History Narrative   Widow; lives in [place]. Three children. 4 grandchildren; 4 great grandchildren   Regular exercise: yes   Caffeine use: tea occasionally    Review of Systems  Constitutional: Negative for fever, chills, appetite change, fatigue and unexpected weight change.  Eyes: Negative for visual disturbance.  Respiratory: Negative for shortness of breath.   Cardiovascular: Negative for chest pain and leg swelling.  Gastrointestinal: Negative for nausea, vomiting, abdominal pain, diarrhea and constipation.  Musculoskeletal: Positive for myalgias, back pain, joint swelling, arthralgias and gait problem.  Skin: Negative for color change and rash.  Hematological: Negative for adenopathy. Does not bruise/bleed easily.  Psychiatric/Behavioral: Negative for sleep disturbance and dysphoric mood. The patient is not nervous/anxious.        Objective:    BP  160/68 mmHg  Pulse 62  Ht 5\' 2"  (1.575 m)  Wt 209 lb 3.2 oz (94.892 kg)  BMI 38.25 kg/m2  SpO2 98% Physical Exam  Constitutional: She is oriented to person, place, and time. She appears well-developed and well-nourished. No distress.  HENT:  Head: Normocephalic and atraumatic.  Right Ear: External ear normal.  Left Ear: External ear normal.  Nose: Nose normal.  Mouth/Throat: Oropharynx is clear and moist. No oropharyngeal exudate.  Eyes: Conjunctivae are normal. Pupils are equal, round, and reactive to light. Right eye exhibits no discharge. Left eye exhibits no discharge. No scleral icterus.  Neck: Normal range of motion. Neck supple. No tracheal deviation present. No thyromegaly present.  Cardiovascular: Normal rate, regular rhythm, normal heart sounds and intact distal pulses.  Exam reveals no gallop and no friction rub.   No murmur heard. Pulmonary/Chest: Effort normal and breath sounds normal. No respiratory distress. She has no wheezes. She has no rales. She exhibits no tenderness.  Musculoskeletal: She exhibits no edema or tenderness.       Left knee: She exhibits decreased range of motion and swelling.  Lymphadenopathy:    She has no cervical adenopathy.  Neurological: She is alert and oriented to person, place, and time. No cranial nerve deficit. She exhibits normal muscle tone. Coordination normal.  Skin: Skin is warm and dry. No rash noted. She is not diaphoretic. No erythema. No pallor.  Psychiatric: She has a normal mood and affect. Her behavior is normal. Judgment and thought content normal.          Assessment & Plan:   Problem List Items Addressed This Visit      High   Poorly controlled type 2 diabetes mellitus with circulatory disorder (HCC) - Primary (Chronic)    BG elevated. Will increase Lantus to 25units daily. Add back Metformin, however we discussed potential risks and will plan to repeat renal function in 1 week. Follow up in 1 week.      Relevant  Medications   metFORMIN (GLUCOPHAGE) 500 MG tablet   insulin glargine (LANTUS) 100 UNIT/ML injection   apixaban (ELIQUIS) 5 MG TABS tablet   Other Relevant Orders   Comprehensive metabolic panel     Unprioritized   GERD (gastroesophageal reflux disease)   Relevant Medications   omeprazole (PRILOSEC) 20 MG capsule   Osteoarthritis, knee    Severe. Discussed importance of getting her BG under control prior to any surgical procedure. Adjusting medication as noted. Will recheck in 1 week. Continue Tramadol prn for pain.      Relevant Medications   traMADol (ULTRAM) 50 MG tablet       Return in about 1 week (around 10/27/2015) for Recheck.  Ronette Deter, MD Internal Medicine [facility]

## 2015-10-20 NOTE — Assessment & Plan Note (Signed)
Severe. Discussed importance of getting her BG under control prior to any surgical procedure. Adjusting medication as noted. Will recheck in 1 week. Continue Tramadol prn for pain.

## 2015-10-23 DIAGNOSIS — T8451XD Infection and inflammatory reaction due to internal right hip prosthesis, subsequent encounter: Secondary | ICD-10-CM | POA: Diagnosis not present

## 2015-10-23 DIAGNOSIS — Z01818 Encounter for other preprocedural examination: Secondary | ICD-10-CM | POA: Diagnosis not present

## 2015-10-23 DIAGNOSIS — Z96641 Presence of right artificial hip joint: Secondary | ICD-10-CM | POA: Diagnosis not present

## 2015-10-26 ENCOUNTER — Telehealth: Payer: Self-pay

## 2015-10-26 DIAGNOSIS — J449 Chronic obstructive pulmonary disease, unspecified: Secondary | ICD-10-CM

## 2015-10-26 DIAGNOSIS — I4891 Unspecified atrial fibrillation: Secondary | ICD-10-CM

## 2015-10-26 DIAGNOSIS — E1122 Type 2 diabetes mellitus with diabetic chronic kidney disease: Secondary | ICD-10-CM | POA: Diagnosis not present

## 2015-10-26 DIAGNOSIS — S72141A Displaced intertrochanteric fracture of right femur, initial encounter for closed fracture: Secondary | ICD-10-CM | POA: Diagnosis not present

## 2015-10-26 DIAGNOSIS — I2510 Atherosclerotic heart disease of native coronary artery without angina pectoris: Secondary | ICD-10-CM

## 2015-10-26 NOTE — Telephone Encounter (Signed)
Patient was informed of results.  Patient understood and no questions, comments, or concerns at this time. Patient is currently in the hospital for "breaking her hip", stated by daughter.

## 2015-10-27 DIAGNOSIS — E1122 Type 2 diabetes mellitus with diabetic chronic kidney disease: Secondary | ICD-10-CM | POA: Diagnosis not present

## 2015-10-27 DIAGNOSIS — S72141A Displaced intertrochanteric fracture of right femur, initial encounter for closed fracture: Secondary | ICD-10-CM | POA: Diagnosis not present

## 2015-10-27 DIAGNOSIS — I4891 Unspecified atrial fibrillation: Secondary | ICD-10-CM | POA: Diagnosis not present

## 2015-10-27 DIAGNOSIS — J449 Chronic obstructive pulmonary disease, unspecified: Secondary | ICD-10-CM | POA: Diagnosis not present

## 2015-11-09 ENCOUNTER — Ambulatory Visit: Payer: Medicare Other | Admitting: Cardiovascular Disease

## 2015-11-14 DIAGNOSIS — N179 Acute kidney failure, unspecified: Secondary | ICD-10-CM | POA: Diagnosis not present

## 2015-11-14 DIAGNOSIS — F329 Major depressive disorder, single episode, unspecified: Secondary | ICD-10-CM

## 2015-11-14 DIAGNOSIS — E039 Hypothyroidism, unspecified: Secondary | ICD-10-CM

## 2015-11-14 DIAGNOSIS — E875 Hyperkalemia: Secondary | ICD-10-CM

## 2015-11-14 DIAGNOSIS — A419 Sepsis, unspecified organism: Secondary | ICD-10-CM | POA: Diagnosis not present

## 2015-11-14 DIAGNOSIS — J449 Chronic obstructive pulmonary disease, unspecified: Secondary | ICD-10-CM

## 2015-11-14 DIAGNOSIS — N183 Chronic kidney disease, stage 3 (moderate): Secondary | ICD-10-CM

## 2015-11-14 DIAGNOSIS — K219 Gastro-esophageal reflux disease without esophagitis: Secondary | ICD-10-CM

## 2015-11-14 DIAGNOSIS — I10 Essential (primary) hypertension: Secondary | ICD-10-CM

## 2015-11-14 DIAGNOSIS — R8271 Bacteriuria: Secondary | ICD-10-CM | POA: Diagnosis not present

## 2015-11-14 DIAGNOSIS — Z9289 Personal history of other medical treatment: Secondary | ICD-10-CM

## 2015-11-14 DIAGNOSIS — Z7901 Long term (current) use of anticoagulants: Secondary | ICD-10-CM

## 2015-11-14 DIAGNOSIS — I2510 Atherosclerotic heart disease of native coronary artery without angina pectoris: Secondary | ICD-10-CM | POA: Diagnosis not present

## 2015-11-14 DIAGNOSIS — E871 Hypo-osmolality and hyponatremia: Secondary | ICD-10-CM

## 2015-11-14 DIAGNOSIS — L03115 Cellulitis of right lower limb: Secondary | ICD-10-CM | POA: Diagnosis not present

## 2015-11-14 DIAGNOSIS — I4891 Unspecified atrial fibrillation: Secondary | ICD-10-CM

## 2015-11-14 DIAGNOSIS — R001 Bradycardia, unspecified: Secondary | ICD-10-CM | POA: Diagnosis not present

## 2015-11-14 DIAGNOSIS — E1142 Type 2 diabetes mellitus with diabetic polyneuropathy: Secondary | ICD-10-CM

## 2015-11-14 DIAGNOSIS — E669 Obesity, unspecified: Secondary | ICD-10-CM

## 2015-11-14 DIAGNOSIS — G8929 Other chronic pain: Secondary | ICD-10-CM

## 2015-11-15 DIAGNOSIS — F329 Major depressive disorder, single episode, unspecified: Secondary | ICD-10-CM | POA: Diagnosis not present

## 2015-11-15 DIAGNOSIS — I10 Essential (primary) hypertension: Secondary | ICD-10-CM

## 2015-11-15 DIAGNOSIS — K219 Gastro-esophageal reflux disease without esophagitis: Secondary | ICD-10-CM

## 2015-11-15 DIAGNOSIS — N183 Chronic kidney disease, stage 3 (moderate): Secondary | ICD-10-CM

## 2015-11-15 DIAGNOSIS — E039 Hypothyroidism, unspecified: Secondary | ICD-10-CM

## 2015-11-15 DIAGNOSIS — I4891 Unspecified atrial fibrillation: Secondary | ICD-10-CM

## 2015-11-15 DIAGNOSIS — L03115 Cellulitis of right lower limb: Secondary | ICD-10-CM | POA: Diagnosis not present

## 2015-11-15 DIAGNOSIS — A419 Sepsis, unspecified organism: Secondary | ICD-10-CM | POA: Diagnosis not present

## 2015-11-15 DIAGNOSIS — I2510 Atherosclerotic heart disease of native coronary artery without angina pectoris: Secondary | ICD-10-CM | POA: Diagnosis not present

## 2015-11-15 DIAGNOSIS — E1142 Type 2 diabetes mellitus with diabetic polyneuropathy: Secondary | ICD-10-CM

## 2015-11-16 ENCOUNTER — Inpatient Hospital Stay (HOSPITAL_COMMUNITY)
Admission: AD | Admit: 2015-11-16 | Discharge: 2015-11-27 | DRG: 559 | Disposition: A | Discharge status: Skilled Nursing Facility | Payer: Medicare Other | Source: Other Acute Inpatient Hospital | Attending: Family Medicine | Admitting: Family Medicine

## 2015-11-16 DIAGNOSIS — A498 Other bacterial infections of unspecified site: Secondary | ICD-10-CM | POA: Diagnosis present

## 2015-11-16 DIAGNOSIS — M069 Rheumatoid arthritis, unspecified: Secondary | ICD-10-CM | POA: Diagnosis present

## 2015-11-16 DIAGNOSIS — I2510 Atherosclerotic heart disease of native coronary artery without angina pectoris: Secondary | ICD-10-CM | POA: Diagnosis present

## 2015-11-16 DIAGNOSIS — A419 Sepsis, unspecified organism: Secondary | ICD-10-CM | POA: Diagnosis not present

## 2015-11-16 DIAGNOSIS — I272 Other secondary pulmonary hypertension: Secondary | ICD-10-CM | POA: Diagnosis present

## 2015-11-16 DIAGNOSIS — E785 Hyperlipidemia, unspecified: Secondary | ICD-10-CM | POA: Diagnosis present

## 2015-11-16 DIAGNOSIS — N183 Chronic kidney disease, stage 3 (moderate): Secondary | ICD-10-CM | POA: Diagnosis present

## 2015-11-16 DIAGNOSIS — S70259A Superficial foreign body, unspecified hip, initial encounter: Secondary | ICD-10-CM

## 2015-11-16 DIAGNOSIS — E871 Hypo-osmolality and hyponatremia: Secondary | ICD-10-CM | POA: Diagnosis present

## 2015-11-16 DIAGNOSIS — T462X5A Adverse effect of other antidysrhythmic drugs, initial encounter: Secondary | ICD-10-CM | POA: Diagnosis present

## 2015-11-16 DIAGNOSIS — I350 Nonrheumatic aortic (valve) stenosis: Secondary | ICD-10-CM | POA: Diagnosis present

## 2015-11-16 DIAGNOSIS — R6520 Severe sepsis without septic shock: Secondary | ICD-10-CM | POA: Diagnosis present

## 2015-11-16 DIAGNOSIS — E118 Type 2 diabetes mellitus with unspecified complications: Secondary | ICD-10-CM

## 2015-11-16 DIAGNOSIS — Y95 Nosocomial condition: Secondary | ICD-10-CM | POA: Diagnosis present

## 2015-11-16 DIAGNOSIS — I130 Hypertensive heart and chronic kidney disease with heart failure and stage 1 through stage 4 chronic kidney disease, or unspecified chronic kidney disease: Secondary | ICD-10-CM | POA: Diagnosis present

## 2015-11-16 DIAGNOSIS — L089 Local infection of the skin and subcutaneous tissue, unspecified: Secondary | ICD-10-CM | POA: Diagnosis present

## 2015-11-16 DIAGNOSIS — S70251A Superficial foreign body, right hip, initial encounter: Secondary | ICD-10-CM | POA: Diagnosis not present

## 2015-11-16 DIAGNOSIS — R079 Chest pain, unspecified: Secondary | ICD-10-CM

## 2015-11-16 DIAGNOSIS — R578 Other shock: Secondary | ICD-10-CM | POA: Diagnosis present

## 2015-11-16 DIAGNOSIS — Y831 Surgical operation with implant of artificial internal device as the cause of abnormal reaction of the patient, or of later complication, without mention of misadventure at the time of the procedure: Secondary | ICD-10-CM | POA: Diagnosis present

## 2015-11-16 DIAGNOSIS — Z7901 Long term (current) use of anticoagulants: Secondary | ICD-10-CM | POA: Diagnosis not present

## 2015-11-16 DIAGNOSIS — Z9981 Dependence on supplemental oxygen: Secondary | ICD-10-CM | POA: Diagnosis not present

## 2015-11-16 DIAGNOSIS — J9601 Acute respiratory failure with hypoxia: Secondary | ICD-10-CM | POA: Diagnosis present

## 2015-11-16 DIAGNOSIS — E872 Acidosis, unspecified: Secondary | ICD-10-CM | POA: Diagnosis present

## 2015-11-16 DIAGNOSIS — G9340 Encephalopathy, unspecified: Secondary | ICD-10-CM | POA: Diagnosis present

## 2015-11-16 DIAGNOSIS — I10 Essential (primary) hypertension: Secondary | ICD-10-CM | POA: Diagnosis not present

## 2015-11-16 DIAGNOSIS — N179 Acute kidney failure, unspecified: Secondary | ICD-10-CM | POA: Diagnosis not present

## 2015-11-16 DIAGNOSIS — I480 Paroxysmal atrial fibrillation: Secondary | ICD-10-CM | POA: Diagnosis present

## 2015-11-16 DIAGNOSIS — I442 Atrioventricular block, complete: Secondary | ICD-10-CM | POA: Diagnosis present

## 2015-11-16 DIAGNOSIS — L89152 Pressure ulcer of sacral region, stage 2: Secondary | ICD-10-CM | POA: Diagnosis present

## 2015-11-16 DIAGNOSIS — Z955 Presence of coronary angioplasty implant and graft: Secondary | ICD-10-CM | POA: Diagnosis not present

## 2015-11-16 DIAGNOSIS — M00051 Staphylococcal arthritis, right hip: Secondary | ICD-10-CM | POA: Diagnosis not present

## 2015-11-16 DIAGNOSIS — N319 Neuromuscular dysfunction of bladder, unspecified: Secondary | ICD-10-CM | POA: Diagnosis present

## 2015-11-16 DIAGNOSIS — E039 Hypothyroidism, unspecified: Secondary | ICD-10-CM | POA: Diagnosis present

## 2015-11-16 DIAGNOSIS — E875 Hyperkalemia: Secondary | ICD-10-CM | POA: Diagnosis present

## 2015-11-16 DIAGNOSIS — K219 Gastro-esophageal reflux disease without esophagitis: Secondary | ICD-10-CM | POA: Diagnosis present

## 2015-11-16 DIAGNOSIS — L03115 Cellulitis of right lower limb: Secondary | ICD-10-CM | POA: Diagnosis not present

## 2015-11-16 DIAGNOSIS — F329 Major depressive disorder, single episode, unspecified: Secondary | ICD-10-CM | POA: Diagnosis present

## 2015-11-16 DIAGNOSIS — T8451XA Infection and inflammatory reaction due to internal right hip prosthesis, initial encounter: Principal | ICD-10-CM | POA: Diagnosis present

## 2015-11-16 DIAGNOSIS — B961 Klebsiella pneumoniae [K. pneumoniae] as the cause of diseases classified elsewhere: Secondary | ICD-10-CM | POA: Diagnosis present

## 2015-11-16 DIAGNOSIS — I5032 Chronic diastolic (congestive) heart failure: Secondary | ICD-10-CM | POA: Diagnosis present

## 2015-11-16 DIAGNOSIS — R001 Bradycardia, unspecified: Secondary | ICD-10-CM | POA: Diagnosis present

## 2015-11-16 DIAGNOSIS — F419 Anxiety disorder, unspecified: Secondary | ICD-10-CM | POA: Diagnosis present

## 2015-11-16 DIAGNOSIS — J189 Pneumonia, unspecified organism: Secondary | ICD-10-CM | POA: Diagnosis not present

## 2015-11-16 DIAGNOSIS — Z8673 Personal history of transient ischemic attack (TIA), and cerebral infarction without residual deficits: Secondary | ICD-10-CM

## 2015-11-16 DIAGNOSIS — T8451XD Infection and inflammatory reaction due to internal right hip prosthesis, subsequent encounter: Secondary | ICD-10-CM | POA: Diagnosis not present

## 2015-11-16 DIAGNOSIS — N171 Acute kidney failure with acute cortical necrosis: Secondary | ICD-10-CM

## 2015-11-16 DIAGNOSIS — B9561 Methicillin susceptible Staphylococcus aureus infection as the cause of diseases classified elsewhere: Secondary | ICD-10-CM | POA: Diagnosis not present

## 2015-11-16 DIAGNOSIS — B9689 Other specified bacterial agents as the cause of diseases classified elsewhere: Secondary | ICD-10-CM | POA: Diagnosis not present

## 2015-11-16 DIAGNOSIS — J440 Chronic obstructive pulmonary disease with acute lower respiratory infection: Secondary | ICD-10-CM | POA: Diagnosis present

## 2015-11-16 DIAGNOSIS — T8149XA Infection following a procedure, other surgical site, initial encounter: Secondary | ICD-10-CM | POA: Diagnosis present

## 2015-11-16 DIAGNOSIS — E876 Hypokalemia: Secondary | ICD-10-CM | POA: Diagnosis not present

## 2015-11-16 DIAGNOSIS — D62 Acute posthemorrhagic anemia: Secondary | ICD-10-CM | POA: Diagnosis present

## 2015-11-16 DIAGNOSIS — J9602 Acute respiratory failure with hypercapnia: Secondary | ICD-10-CM | POA: Diagnosis present

## 2015-11-16 DIAGNOSIS — Z79899 Other long term (current) drug therapy: Secondary | ICD-10-CM | POA: Diagnosis not present

## 2015-11-16 DIAGNOSIS — N19 Unspecified kidney failure: Secondary | ICD-10-CM

## 2015-11-16 DIAGNOSIS — A4101 Sepsis due to Methicillin susceptible Staphylococcus aureus: Secondary | ICD-10-CM | POA: Diagnosis present

## 2015-11-16 DIAGNOSIS — Z794 Long term (current) use of insulin: Secondary | ICD-10-CM | POA: Diagnosis not present

## 2015-11-16 DIAGNOSIS — IMO0002 Reserved for concepts with insufficient information to code with codable children: Secondary | ICD-10-CM | POA: Diagnosis present

## 2015-11-16 DIAGNOSIS — E1165 Type 2 diabetes mellitus with hyperglycemia: Secondary | ICD-10-CM | POA: Diagnosis present

## 2015-11-16 DIAGNOSIS — Z6841 Body Mass Index (BMI) 40.0 and over, adult: Secondary | ICD-10-CM | POA: Diagnosis not present

## 2015-11-16 DIAGNOSIS — S70259D Superficial foreign body, unspecified hip, subsequent encounter: Secondary | ICD-10-CM | POA: Diagnosis not present

## 2015-11-16 DIAGNOSIS — T814XXA Infection following a procedure, initial encounter: Secondary | ICD-10-CM | POA: Diagnosis not present

## 2015-11-16 DIAGNOSIS — E1122 Type 2 diabetes mellitus with diabetic chronic kidney disease: Secondary | ICD-10-CM | POA: Diagnosis present

## 2015-11-16 DIAGNOSIS — I361 Nonrheumatic tricuspid (valve) insufficiency: Secondary | ICD-10-CM | POA: Diagnosis not present

## 2015-11-16 DIAGNOSIS — N170 Acute kidney failure with tubular necrosis: Secondary | ICD-10-CM | POA: Diagnosis present

## 2015-11-16 DIAGNOSIS — A4901 Methicillin susceptible Staphylococcus aureus infection, unspecified site: Secondary | ICD-10-CM | POA: Diagnosis not present

## 2015-11-16 DIAGNOSIS — L89159 Pressure ulcer of sacral region, unspecified stage: Secondary | ICD-10-CM | POA: Diagnosis present

## 2015-11-16 DIAGNOSIS — L8990 Pressure ulcer of unspecified site, unspecified stage: Secondary | ICD-10-CM | POA: Diagnosis present

## 2015-11-16 LAB — POCT I-STAT 3, ART BLOOD GAS (G3+)
ACID-BASE DEFICIT: 9 mmol/L — AB (ref 0.0–2.0)
Acid-base deficit: 11 mmol/L — ABNORMAL HIGH (ref 0.0–2.0)
Bicarbonate: 15.1 mEq/L — ABNORMAL LOW (ref 20.0–24.0)
Bicarbonate: 16.1 mEq/L — ABNORMAL LOW (ref 20.0–24.0)
O2 SAT: 97 %
O2 SAT: 98 %
PCO2 ART: 31.3 mmHg — AB (ref 35.0–45.0)
PCO2 ART: 32.8 mmHg — AB (ref 35.0–45.0)
PH ART: 7.268 — AB (ref 7.350–7.450)
PH ART: 7.315 — AB (ref 7.350–7.450)
Patient temperature: 97.4
Patient temperature: 97.4
TCO2: 16 mmol/L (ref 0–100)
TCO2: 17 mmol/L (ref 0–100)
pO2, Arterial: 103 mmHg — ABNORMAL HIGH (ref 80.0–100.0)
pO2, Arterial: 119 mmHg — ABNORMAL HIGH (ref 80.0–100.0)

## 2015-11-16 LAB — GLUCOSE, CAPILLARY: Glucose-Capillary: 284 mg/dL — ABNORMAL HIGH (ref 65–99)

## 2015-11-16 MED ORDER — SODIUM CHLORIDE 0.9 % IV BOLUS (SEPSIS)
1000.0000 mL | Freq: Once | INTRAVENOUS | Status: AC
  Administered 2015-11-17: 1000 mL via INTRAVENOUS

## 2015-11-16 MED ORDER — DOPAMINE-DEXTROSE 3.2-5 MG/ML-% IV SOLN: 5.0000 ug/kg/min | INTRAVENOUS | Status: DC

## 2015-11-16 MED ORDER — LEVOTHYROXINE SODIUM 100 MCG IV SOLR
75.0000 ug | Freq: Every day | INTRAVENOUS | Status: DC
  Administered 2015-11-17: 75 ug via INTRAVENOUS
  Filled 2015-11-16 (×2): qty 5

## 2015-11-16 MED ORDER — PANTOPRAZOLE SODIUM 40 MG PO TBEC: 40.0000 mg | DELAYED_RELEASE_TABLET | Freq: Every day | ORAL | Status: DC

## 2015-11-16 MED ORDER — ATROPINE SULFATE 1 MG/10ML IJ SOSY
PREFILLED_SYRINGE | INTRAMUSCULAR | Status: AC
  Filled 2015-11-16: qty 10

## 2015-11-16 MED ORDER — ATORVASTATIN CALCIUM 20 MG PO TABS
20.0000 mg | ORAL_TABLET | Freq: Every day | ORAL | Status: DC
  Administered 2015-11-17 – 2015-11-27 (×10): 20 mg via ORAL
  Filled 2015-11-16 (×12): qty 1

## 2015-11-16 MED ORDER — DOPAMINE-DEXTROSE 3.2-5 MG/ML-% IV SOLN
5.0000 ug/kg/min | INTRAVENOUS | Status: DC
  Administered 2015-11-17: 5 ug/kg/min via INTRAVENOUS
  Filled 2015-11-16: qty 250

## 2015-11-16 MED ORDER — HEPARIN SODIUM (PORCINE) 5000 UNIT/ML IJ SOLN: 5000.0000 [IU] | Freq: Three times a day (TID) | INTRAMUSCULAR | Status: DC

## 2015-11-16 MED ORDER — SODIUM CHLORIDE 0.9 % IV SOLN: 250.0000 mL | INTRAVENOUS | Status: DC | PRN

## 2015-11-16 MED ORDER — INSULIN ASPART 100 UNIT/ML ~~LOC~~ SOLN
0.0000 [IU] | SUBCUTANEOUS | Status: DC
  Administered 2015-11-17: 11 [IU] via SUBCUTANEOUS
  Administered 2015-11-17: 3 [IU] via SUBCUTANEOUS
  Administered 2015-11-17: 7 [IU] via SUBCUTANEOUS
  Administered 2015-11-18: 3 [IU] via SUBCUTANEOUS

## 2015-11-16 MED ORDER — STERILE WATER FOR INJECTION IV SOLN
INTRAVENOUS | Status: DC
  Administered 2015-11-16 – 2015-11-17 (×3): via INTRAVENOUS
  Filled 2015-11-16 (×7): qty 850

## 2015-11-16 NOTE — H&P (Signed)
PULMONARY / CRITICAL CARE MEDICINE   Name: [name] [name] MRN: [id number] DOB: Nov 24, 1934    ADMISSION DATE:  11/16/2015 CONSULTATION DATE:  11/16/15  REFERRING MD:  [facility]  CHIEF COMPLAINT:  Sepsis  HISTORY OF PRESENT ILLNESS:  Pt is encephelopathic; therefore, this HPI is obtained from chart review. [name] [name] is a 81 y.o. female with PMH as outlined below including recent right hip hemiarthroplasty roughly 10/27/15 complicated by infected surgical site.  She was admitted again to [facility] on 6/27 and underwent I&D of the right hip with implantation of spacer and wound vac and was started on empiric vanc / zosyn.  In addition, she had severe bradycardia with HR in the 20's.  She was seen in consultation by cardiology who felt that bradycardia was related to amiodarone and metoprolol; therefore, these were discontinued and pt was started on Isuprel.  Her hospital stay was complicated by anemia for which she received 4u PRBC transfusion along with worsening renal failure.  She was transferred to [facility] 6/29 for further evaluation and management.  PAST MEDICAL HISTORY :  She  has a past medical history of DM (diabetes mellitus) (HCC); PAF (paroxysmal atrial fibrillation) (HCC); Dyslipidemia; CAD (coronary artery disease); Osteoarthritis; Hypothyroidism; Rheumatoid arthritis(714.0); Obesity; GERD (gastroesophageal reflux disease); Shoulder fracture, left; Cerebral infarction (HCC); CKD (chronic kidney disease), stage III; Asthma; Chronic diastolic CHF (congestive heart failure) (HCC); Hypertensive heart disease; and Right rib fracture.  PAST SURGICAL HISTORY: She  has past surgical history that includes RCA stent placement (2000); restenosis with PTCA placement (2005); cataract surgery; partial hysterectomy; Replacement total knee (2013); Shoulder surgery (02/04/13); Cardiac catheterization (06/11/2013); Coronary angioplasty; and Cardiac catheterization (N/A, 07/25/2015).  Allergies   Allergen Reactions  . Oxycodone Nausea And Vomiting    No current facility-administered medications on file prior to encounter.   Current Outpatient Prescriptions on File Prior to Encounter  Medication Sig  . albuterol (PROVENTIL HFA;VENTOLIN HFA) 108 (90 Base) MCG/ACT inhaler Inhale 2 puffs into the lungs every 6 (six) hours as needed for wheezing or shortness of breath.  . ALPRAZolam (XANAX) 0.25 MG tablet Take 1 tablet (0.25 mg total) by mouth 2 (two) times daily as needed for anxiety.  Marland Kitchen amiodarone (PACERONE) 200 MG tablet Take 1 tablet (200 mg total) by mouth daily.  Marland Kitchen amLODipine (NORVASC) 10 MG tablet Take 1 tablet (10 mg total) by mouth daily.  Marland Kitchen apixaban (ELIQUIS) 5 MG TABS tablet Take 1 tablet (5 mg total) by mouth 2 (two) times daily.  Marland Kitchen atorvastatin (LIPITOR) 20 MG tablet Take 1 tablet (20 mg total) by mouth daily.  . ferrous sulfate 325 (65 FE) MG tablet Take 325 mg by mouth daily with breakfast.  . fluconazole (DIFLUCAN) 150 MG tablet Take 1 tablet (150 mg total) by mouth once.  . fluticasone (FLONASE) 50 MCG/ACT nasal spray Place 2 sprays into both nostrils daily as needed for rhinitis.  Marland Kitchen gabapentin (NEURONTIN) 300 MG capsule Take 1 capsule (300 mg total) by mouth 3 (three) times daily.  . insulin aspart (NOVOLOG) 100 UNIT/ML injection Inject 2-10 Units into the skin 3 (three) times daily with meals as needed for high blood sugar. Pt uses per sliding scale:  140-199:  2 units  200-250:  4 units  251-299:  6 units  300-349:  8 units  Greater than 350: 10 units  . insulin glargine (LANTUS) 100 UNIT/ML injection Inject 0.25 mLs (25 Units total) into the skin daily.  Marland Kitchen latanoprost (XALATAN) 0.005 % ophthalmic solution Place  1 drop into both eyes at bedtime.   Marland Kitchen levothyroxine (SYNTHROID, LEVOTHROID) 150 MCG tablet Take 1 tablet (150 mcg total) by mouth daily before breakfast.  . lisinopril (PRINIVIL,ZESTRIL) 10 MG tablet Take 1 tablet (10 mg total) by mouth daily.  .  metFORMIN (GLUCOPHAGE) 500 MG tablet Take 1 tablet (500 mg total) by mouth 2 (two) times daily with a meal.  . nitroGLYCERIN (NITROSTAT) 0.4 MG SL tablet Place 0.4 mg under the tongue every 5 (five) minutes as needed for chest pain.  Marland Kitchen omeprazole (PRILOSEC) 20 MG capsule Take 1 capsule (20 mg total) by mouth 2 (two) times daily.  . ondansetron (ZOFRAN-ODT) 4 MG disintegrating tablet Take 1 tablet (4 mg total) by mouth every 8 (eight) hours as needed for nausea or vomiting.  Marland Kitchen RELION INSULIN SYR 0.5ML/31G 31G X 5/16" 0.5 ML MISC   . sertraline (ZOLOFT) 50 MG tablet Take 1 tablet (50 mg total) by mouth daily.  Marland Kitchen torsemide (DEMADEX) 20 MG tablet Take 1 tablet (20 mg total) by mouth 2 (two) times daily.  . traMADol (ULTRAM) 50 MG tablet Take 2 tablets (100 mg total) by mouth every 8 (eight) hours as needed for moderate pain.    FAMILY HISTORY:  Her indicated that her mother is deceased. She indicated that her father is deceased.   SOCIAL HISTORY: She  reports that she has never smoked. She has never used smokeless tobacco. She reports that she does not drink alcohol or use illicit drugs.  REVIEW OF SYSTEMS:  Unable to obtain as pt is on BiPAP.  SUBJECTIVE:  On BiPAP, protecting airway.  VITAL SIGNS: BP 110/42 mmHg  Pulse 53  Temp(Src) 97.4 F (36.3 C) (Oral)  Resp 16  Wt 209 lb 3.5 oz (94.9 kg)  SpO2 100%  HEMODYNAMICS:    VENTILATOR SETTINGS: Vent Mode:  [-] BIPAP FiO2 (%):  [30 %] 30 % Set Rate:  [10 bmp] 10 bmp  INTAKE / OUTPUT:     PHYSICAL EXAMINATION: General: Elderly appearing female, in NAD. Neuro: Awake, nods head in response to questions. HEENT: [place]/AT. PERRL, sclerae anicteric. BiPAP in place. Cardiovascular: RRR, no M/R/G.  Lungs: Respirations even and unlabored.  CTA bilaterally, No W/R/R. Abdomen: BS x 4, soft, NT/ND.  Musculoskeletal: R hip wound vac in place, no edema.  Skin: Intact, warm, no rashes.  LABS:  BMET No results for input(s): NA, K, CL,  CO2, BUN, CREATININE, GLUCOSE in the last 168 hours.  Electrolytes No results for input(s): CALCIUM, MG, PHOS in the last 168 hours.  CBC No results for input(s): WBC, HGB, HCT, PLT in the last 168 hours.  Coag's No results for input(s): APTT, INR in the last 168 hours.  Sepsis Markers No results for input(s): LATICACIDVEN, PROCALCITON, O2SATVEN in the last 168 hours.  ABG No results for input(s): PHART, PCO2ART, PO2ART in the last 168 hours.  Liver Enzymes No results for input(s): AST, ALT, ALKPHOS, BILITOT, ALBUMIN in the last 168 hours.  Cardiac Enzymes No results for input(s): TROPONINI, PROBNP in the last 168 hours.  Glucose  Recent Labs Lab 11/16/15 2208  GLUCAP 284*    Imaging No results found.   STUDIES:  None.  CULTURES: None.  ANTIBIOTICS: Vanc 06/27 > Zozyn 06/27 >   SIGNIFICANT EVENTS: 06/09 > right hip hemiarthroplasty 06/27 > admitted with infected right hip.  Taken to OR for I&D and spacer implantation. 06/29 > Worsened renal function.  [facility] further evaluation and management.  LINES/TUBES: R  IJ CVL [facility]) > L radial A line [facility]) >  DISCUSSION: 81 y.o. F transferred from [place] where she was admitted 6/27 due to infected right hip (right hip arthroplasty performed 6/9).  Had persistent bradycardia and worsening renal function so transferred to [facility] 6/29.  ASSESSMENT / PLAN:  INFECTIOUS A:   Sepsis - due to infected right hip; s/p I&D 11/14/15. P:   Abx as above (vanc / zosyn).  Follow cultures from [place]. PCT algorithm to limit abx exposure.  CARDIOVASCULAR A:  Sepsis - due to infected right hip; s/p I&D 11/14/15. Bradycardia. Hx PAF, HTN, CAD, HLD. P:  Goal MAP > 65. Assess EKG, lactate, troponin, SvO2, CVP's. Dopamine PRN bradycardia. Heparin gtt in lieu of outpatient apixaban. Continue outpatient atorvastatin. Hold outpatient amiodarone, amlodipine, lisinopril. Day team to please consult  cardiology. May require PPM (reportedly had 3rd degree heart block at [facility]).  RENAL A:   NAGMA. AKI - renal US at [facility] was reportedly negative.  Currently oliguric. Hypocalcemia. P:   HCO3 @ 75. 1g Ca gluconate. Assess CMP. Day team, may need to consult nephrology if UOP and SCr do not improve.  PULMONARY A: Acute hypoxic respiratory failure. P:   Continue BiPAP as needed to maintain SpO2 > 90%. Pulmonary hygiene. CXR in AM.  GASTROINTESTINAL A:   GERD. Nutrition. Morbid obesity. P:   Pantoprazole. NPO.  HEMATOLOGIC A:   VTE Prophylaxis. P:  SCD's / heparin gtt. Assess CBC, coags.  ENDOCRINE A:   DM 2. Hypothyroidism. P:   SSI. Continue outpatient synthroid, change to IV formulation. Assess TSH. Hold outpatient lantus, metformin.  NEUROLOGIC A:   Hx anxiety / depression, neurogenic bladder (chronic foley replaced at [place]) P:   Hold outpatient alprazolam, gabapentin, sertraline.  ORTHOPEDICS A: Right femoral fx - s/p right hip hemiarthroplasty 10/27/15 complicated by infected right hip P: Day team to please consult orthopedics.   Family updated: No family available.  Interdisciplinary Family Meeting v Palliative Care Meeting:  Due by: 11/22/15.  CC time: 35 minutes.   Rutherford Guys, [place] [facility] Pager: [phone]  or [phone] 11/16/2015, 11:08 PM  Attending Note:  81 year old female with infected right hip who presents with renal failure and hypercarbic respiratory failure on BiPAP and was transferred to [facility] for evaluation by surgery and by renal.  The patient upon presentation was altered and in metabolic acidosis.  BiPAP was adjusted and pH improved to 7.315 and mental status seemed to improve.  On exam, lungs with bibasilar crackles.  Will increase Bicarb drip.  Continue vanc/zosyn.  F/u on cultures.  Will not call renal at night will call consult in AM.  No HD catheter placement  until hydrated and seen by renal.  The patient is critically ill with multiple organ systems failure and requires high complexity decision making for assessment and support, frequent evaluation and titration of therapies, application of advanced monitoring technologies and extensive interpretation of multiple databases.   Critical Care Time devoted to patient care services described in this note is  35  Minutes. This time reflects time of care of this signee Dr Koren Bound. This critical care time does not reflect procedure time, or teaching time or supervisory time of PA/NP/Med student/Med Resident etc but could involve care discussion time.  Alyson Reedy, M.D. [facility] Pulmonary/Critical Care Medicine. Pager: [phone]. After hours pager: [phone].

## 2015-11-16 NOTE — Progress Notes (Signed)
Increased ipap to 18 per MD.

## 2015-11-17 ENCOUNTER — Inpatient Hospital Stay (HOSPITAL_COMMUNITY): Payer: Medicare Other

## 2015-11-17 DIAGNOSIS — I480 Paroxysmal atrial fibrillation: Secondary | ICD-10-CM

## 2015-11-17 DIAGNOSIS — E875 Hyperkalemia: Secondary | ICD-10-CM

## 2015-11-17 DIAGNOSIS — R6521 Severe sepsis with septic shock: Secondary | ICD-10-CM

## 2015-11-17 DIAGNOSIS — R001 Bradycardia, unspecified: Secondary | ICD-10-CM

## 2015-11-17 DIAGNOSIS — L8990 Pressure ulcer of unspecified site, unspecified stage: Secondary | ICD-10-CM | POA: Diagnosis present

## 2015-11-17 LAB — CBC WITH DIFFERENTIAL/PLATELET
BASOS ABS: 0 10*3/uL (ref 0.0–0.1)
Basophils Relative: 0 %
EOS ABS: 0 10*3/uL (ref 0.0–0.7)
EOS PCT: 0 %
HCT: 27.2 % — ABNORMAL LOW (ref 36.0–46.0)
Hemoglobin: 8.9 g/dL — ABNORMAL LOW (ref 12.0–15.0)
LYMPHS ABS: 0.7 10*3/uL (ref 0.7–4.0)
LYMPHS PCT: 4 %
MCH: 28.9 pg (ref 26.0–34.0)
MCHC: 32.7 g/dL (ref 30.0–36.0)
MCV: 88.3 fL (ref 78.0–100.0)
MONO ABS: 0.5 10*3/uL (ref 0.1–1.0)
Monocytes Relative: 3 %
Neutro Abs: 15.5 10*3/uL — ABNORMAL HIGH (ref 1.7–7.7)
Neutrophils Relative %: 93 %
PLATELETS: 344 10*3/uL (ref 150–400)
RBC: 3.08 MIL/uL — ABNORMAL LOW (ref 3.87–5.11)
RDW: 15.7 % — AB (ref 11.5–15.5)
WBC: 16.7 10*3/uL — ABNORMAL HIGH (ref 4.0–10.5)

## 2015-11-17 LAB — COMPREHENSIVE METABOLIC PANEL
ALT: 8 U/L — ABNORMAL LOW (ref 14–54)
AST: 15 U/L (ref 15–41)
Albumin: 1.5 g/dL — ABNORMAL LOW (ref 3.5–5.0)
Alkaline Phosphatase: 108 U/L (ref 38–126)
Anion gap: 7 (ref 5–15)
BILIRUBIN TOTAL: 0.5 mg/dL (ref 0.3–1.2)
BUN: 45 mg/dL — AB (ref 6–20)
CHLORIDE: 103 mmol/L (ref 101–111)
CO2: 17 mmol/L — ABNORMAL LOW (ref 22–32)
Calcium: 6.8 mg/dL — ABNORMAL LOW (ref 8.9–10.3)
Creatinine, Ser: 4.51 mg/dL — ABNORMAL HIGH (ref 0.44–1.00)
GFR, EST AFRICAN AMERICAN: 10 mL/min — AB (ref >60)
GFR, EST NON AFRICAN AMERICAN: 9 mL/min — AB (ref >60)
Glucose, Bld: 277 mg/dL — ABNORMAL HIGH (ref 65–99)
POTASSIUM: 5.3 mmol/L — AB (ref 3.5–5.1)
Sodium: 127 mmol/L — ABNORMAL LOW (ref 135–145)
TOTAL PROTEIN: 5 g/dL — AB (ref 6.5–8.1)

## 2015-11-17 LAB — BASIC METABOLIC PANEL
ANION GAP: 9 (ref 5–15)
Anion gap: 10 (ref 5–15)
BUN: 44 mg/dL — AB (ref 6–20)
BUN: 46 mg/dL — ABNORMAL HIGH (ref 6–20)
CALCIUM: 6.9 mg/dL — AB (ref 8.9–10.3)
CALCIUM: 6.8 mg/dL — AB (ref 8.9–10.3)
CHLORIDE: 97 mmol/L — AB (ref 101–111)
CO2: 19 mmol/L — AB (ref 22–32)
CO2: 21 mmol/L — ABNORMAL LOW (ref 22–32)
CREATININE: 4.54 mg/dL — AB (ref 0.44–1.00)
Chloride: 98 mmol/L — ABNORMAL LOW (ref 101–111)
Creatinine, Ser: 4.68 mg/dL — ABNORMAL HIGH (ref 0.44–1.00)
GFR calc non Af Amer: 8 mL/min — ABNORMAL LOW (ref >60)
GFR, EST AFRICAN AMERICAN: 10 mL/min — AB (ref >60)
GFR, EST AFRICAN AMERICAN: 9 mL/min — AB (ref >60)
GFR, EST NON AFRICAN AMERICAN: 8 mL/min — AB (ref >60)
Glucose, Bld: 206 mg/dL — ABNORMAL HIGH (ref 65–99)
Glucose, Bld: 97 mg/dL (ref 65–99)
Potassium: 5.3 mmol/L — ABNORMAL HIGH (ref 3.5–5.1)
Potassium: 5.4 mmol/L — ABNORMAL HIGH (ref 3.5–5.1)
Sodium: 126 mmol/L — ABNORMAL LOW (ref 135–145)
Sodium: 128 mmol/L — ABNORMAL LOW (ref 135–145)

## 2015-11-17 LAB — CBC
HEMATOCRIT: 25.7 % — AB (ref 36.0–46.0)
Hemoglobin: 8.4 g/dL — ABNORMAL LOW (ref 12.0–15.0)
MCH: 28.9 pg (ref 26.0–34.0)
MCHC: 32.7 g/dL (ref 30.0–36.0)
MCV: 88.3 fL (ref 78.0–100.0)
Platelets: 457 10*3/uL — ABNORMAL HIGH (ref 150–400)
RBC: 2.91 MIL/uL — ABNORMAL LOW (ref 3.87–5.11)
RDW: 15.8 % — AB (ref 11.5–15.5)
WBC: 23.4 10*3/uL — ABNORMAL HIGH (ref 4.0–10.5)

## 2015-11-17 LAB — HEPARIN LEVEL (UNFRACTIONATED)
HEPARIN UNFRACTIONATED: 1.12 [IU]/mL — AB (ref 0.30–0.70)
Heparin Unfractionated: 1 IU/mL — ABNORMAL HIGH (ref 0.30–0.70)

## 2015-11-17 LAB — URINE MICROSCOPIC-ADD ON

## 2015-11-17 LAB — GLUCOSE, CAPILLARY
GLUCOSE-CAPILLARY: 239 mg/dL — AB (ref 65–99)
GLUCOSE-CAPILLARY: 285 mg/dL — AB (ref 65–99)
GLUCOSE-CAPILLARY: 77 mg/dL (ref 65–99)
Glucose-Capillary: 112 mg/dL — ABNORMAL HIGH (ref 65–99)
Glucose-Capillary: 112 mg/dL — ABNORMAL HIGH (ref 65–99)
Glucose-Capillary: 145 mg/dL — ABNORMAL HIGH (ref 65–99)
Glucose-Capillary: 99 mg/dL (ref 65–99)

## 2015-11-17 LAB — PROTIME-INR
INR: 1.36 (ref 0.00–1.49)
PROTHROMBIN TIME: 16.8 s — AB (ref 11.6–15.2)

## 2015-11-17 LAB — LACTIC ACID, PLASMA: Lactic Acid, Venous: 2.3 mmol/L (ref 0.5–1.9)

## 2015-11-17 LAB — PROCALCITONIN
Procalcitonin: 0.6 ng/mL
Procalcitonin: 0.64 ng/mL

## 2015-11-17 LAB — APTT
APTT: 71 s — AB (ref 24–37)
aPTT: 38 seconds — ABNORMAL HIGH (ref 24–37)
aPTT: 46 seconds — ABNORMAL HIGH (ref 24–37)

## 2015-11-17 LAB — URINALYSIS, ROUTINE W REFLEX MICROSCOPIC
Glucose, UA: NEGATIVE mg/dL
KETONES UR: 15 mg/dL — AB
NITRITE: NEGATIVE
PROTEIN: 30 mg/dL — AB
Specific Gravity, Urine: 1.033 — ABNORMAL HIGH (ref 1.005–1.030)
pH: 5 (ref 5.0–8.0)

## 2015-11-17 LAB — TROPONIN I: TROPONIN I: 0.06 ng/mL — AB (ref <0.03)

## 2015-11-17 LAB — PHOSPHORUS: PHOSPHORUS: 5.7 mg/dL — AB (ref 2.5–4.6)

## 2015-11-17 LAB — MRSA PCR SCREENING: MRSA BY PCR: NEGATIVE

## 2015-11-17 LAB — MAGNESIUM: MAGNESIUM: 1.1 mg/dL — AB (ref 1.7–2.4)

## 2015-11-17 LAB — VANCOMYCIN, RANDOM: Vancomycin Rm: 36

## 2015-11-17 LAB — SODIUM, URINE, RANDOM: SODIUM UR: 17 mmol/L

## 2015-11-17 LAB — TSH: TSH: 1.861 u[IU]/mL (ref 0.350–4.500)

## 2015-11-17 LAB — ABO/RH: ABO/RH(D): O POS

## 2015-11-17 LAB — CREATININE, URINE, RANDOM: CREATININE, URINE: 144.84 mg/dL

## 2015-11-17 LAB — OSMOLALITY: Osmolality: 282 mOsm/kg (ref 275–295)

## 2015-11-17 MED ORDER — DOPAMINE-DEXTROSE 3.2-5 MG/ML-% IV SOLN
0.0000 ug/kg/min | INTRAVENOUS | Status: DC
  Administered 2015-11-17: 10 ug/kg/min via INTRAVENOUS
  Administered 2015-11-18: 5.901 ug/kg/min via INTRAVENOUS
  Filled 2015-11-17 (×2): qty 250

## 2015-11-17 MED ORDER — CHLORHEXIDINE GLUCONATE 0.12 % MT SOLN
15.0000 mL | Freq: Two times a day (BID) | OROMUCOSAL | Status: DC
  Administered 2015-11-17 – 2015-11-27 (×18): 15 mL via OROMUCOSAL
  Filled 2015-11-17 (×12): qty 15

## 2015-11-17 MED ORDER — HEPARIN (PORCINE) IN NACL 100-0.45 UNIT/ML-% IJ SOLN
1150.0000 [IU]/h | INTRAMUSCULAR | Status: DC
  Administered 2015-11-17: 1000 [IU]/h via INTRAVENOUS
  Administered 2015-11-18 (×2): 1150 [IU]/h via INTRAVENOUS
  Filled 2015-11-17 (×5): qty 250

## 2015-11-17 MED ORDER — HEPARIN SODIUM (PORCINE) 1000 UNIT/ML DIALYSIS
1000.0000 [IU] | INTRAMUSCULAR | Status: DC | PRN
  Administered 2015-11-17: 5000 [IU] via INTRAVENOUS_CENTRAL
  Filled 2015-11-17: qty 3
  Filled 2015-11-17: qty 5
  Filled 2015-11-17: qty 6
  Filled 2015-11-17: qty 3
  Filled 2015-11-17: qty 6

## 2015-11-17 MED ORDER — SODIUM CHLORIDE 0.9 % IV BOLUS (SEPSIS)
500.0000 mL | Freq: Once | INTRAVENOUS | Status: AC
  Administered 2015-11-17: 500 mL via INTRAVENOUS

## 2015-11-17 MED ORDER — DEXTROSE-NACL 5-0.45 % IV SOLN
INTRAVENOUS | Status: DC
  Administered 2015-11-17 – 2015-11-18 (×2): via INTRAVENOUS

## 2015-11-17 MED ORDER — PIPERACILLIN-TAZOBACTAM IN DEX 2-0.25 GM/50ML IV SOLN
2.2500 g | Freq: Three times a day (TID) | INTRAVENOUS | Status: DC
  Administered 2015-11-17 (×3): 2.25 g via INTRAVENOUS
  Filled 2015-11-17 (×5): qty 50

## 2015-11-17 MED ORDER — FENTANYL CITRATE (PF) 100 MCG/2ML IJ SOLN
12.5000 ug | INTRAMUSCULAR | Status: DC | PRN
  Administered 2015-11-17: 25 ug via INTRAVENOUS
  Administered 2015-11-17 – 2015-11-21 (×10): 50 ug via INTRAVENOUS
  Filled 2015-11-17 (×11): qty 2

## 2015-11-17 MED ORDER — CETYLPYRIDINIUM CHLORIDE 0.05 % MT LIQD
7.0000 mL | Freq: Two times a day (BID) | OROMUCOSAL | Status: DC
  Administered 2015-11-17 – 2015-11-27 (×14): 7 mL via OROMUCOSAL

## 2015-11-17 MED ORDER — ALBUTEROL SULFATE (2.5 MG/3ML) 0.083% IN NEBU: 2.5000 mg | INHALATION_SOLUTION | Freq: Four times a day (QID) | RESPIRATORY_TRACT | Status: DC | PRN

## 2015-11-17 MED ORDER — ONDANSETRON HCL 4 MG/2ML IJ SOLN
4.0000 mg | Freq: Four times a day (QID) | INTRAMUSCULAR | Status: DC | PRN
  Administered 2015-11-17 – 2015-11-21 (×2): 4 mg via INTRAVENOUS
  Filled 2015-11-17 (×2): qty 2

## 2015-11-17 MED ORDER — HEPARIN BOLUS VIA INFUSION
1000.0000 [IU] | Freq: Once | INTRAVENOUS | Status: AC
  Administered 2015-11-17: 1000 [IU] via INTRAVENOUS
  Filled 2015-11-17: qty 1000

## 2015-11-17 NOTE — Consult Note (Addendum)
Cardiology Consult    Patient ID: [name] [name] MRN: [id number], DOB/AGE: [birth year]   Admit date: 11/16/2015 Date of Consult: 11/17/2015  Primary Physician: [name], MD Reason for Consult: Questionable Junctional Rhythm Primary Cardiologist: Dr. Mariah Milling Requesting Provider: Dr. Tyson Alias   History of Present Illness    [name] [name] is a 81 y.o. female with past medical history of PAF (on Eliquis PTA), CAD (s/p PCI of RCA in 2000, 2005 secondary to ISR), chronic diastolic CHF (EF 55-73% by echo in 07/2015), Stage 3 CKD, COPD, HTN, and HLD who presented to [facility] on 11/16/2015 as a transfer from [facility].  According to records, she underwent right hip hemiarthroplasty on 11/06/2015 and recovery was complicated by infection and she had an I&D of the hip on 6/27. She was started on empiric antibiotics at that time as well.   On admission on 6/27, her HR was 38. An EKG on 11/14/2015 reports a HR of 28. It was initiially felt her bradycardia might be secondary to Metoprolol and Amiodarone, therefore both medications were discontinued and she was started on Isuprel drip at that time.   She also experienced signifiant anemia during her hospitalization and required 4 units pRBCs. She was transferred to [facility] on 11/16/2015 for further management of her sepsis and medical conditions.  She was noted to have altered mental status upon arrival with a pH of 7.268 and CO2 od 32.8. Her BiPAP settings were adjusted. Other labs values include a troponin of 0.06. TSH 1.861. WBC 16.7, Hgb 8.9, plateles 344.Na+ 126, K+ 5.3, creatinine 4.54 (was 1.09 four weeks ago).   EKG was read as a junctional rhythm with HR of 54 and T-wave abnormality in the lateral leads. It does appear occasional p-waves are noted, most consistent with sinus bradycardia. On telemetry, it appears she was in a slow-irregular rhythm around 2300 on 6/29, most consistent with atrial fibrillation. Now she  is in NSR with HR in 50's - 60's on telemetry with definitive p-waves noted.  She has been hypotensive with BP ranging from 67/37 - 132/61 in the past 24 hours. She is currently on Dopamine 10 cg/kg/min with BP at 132/45.  Her daughter and son-in-law are at the bedside today.The patient answers questions today with yes/no answers and is able to follow commands. She does elaborate on some questions but looks at her daughter for answers. She denies any current pain, but does flinch when her feet are touched. No chest discomfort or palpitations.    Past Medical History   Past Medical History  Diagnosis Date  . DM (diabetes mellitus) (HCC)   . PAF (paroxysmal atrial fibrillation) (HCC)     a. s/p dccv 04/2014;  b. amio/eliquis;  c. 06/2013 bb/ccb d/c 2/2 symptomatic bradycardia; d. 04/2015 recurrent AF->amio increased/bb resumed.  . Dyslipidemia   . CAD (coronary artery disease)     a. 2000 s/p PCI RCA;  b. 2005 PCI of RCA 2/2 ISR; c. 04/2013 Neg MV;  d. 05/2013 Cath: LM 20, LAD 30p, LCX 20p, OM1 90 small, RCA 41m (PTCA - FFR 0.86), PDA 40.  . Osteoarthritis   . Hypothyroidism     hx  . Rheumatoid arthritis(714.0)   . Obesity   . GERD (gastroesophageal reflux disease)   . Shoulder fracture, left     a. Dr. Martha Clan  . Cerebral infarction (HCC)   . CKD (chronic kidney disease), stage III   . Asthma   . Chronic diastolic CHF (congestive  heart failure) (HCC)     a. 10/2013 Echo: EF 60-65%, no rwma, Gr1 DD, mild MR, mildly dil LA.  Marland Kitchen Hypertensive heart disease   . Right rib fracture     a. 04/2015.    Past Surgical History  Procedure Laterality Date  . Rca stent placement  2000  . Restenosis with ptca placement  2005  . Cataract surgery    . Partial hysterectomy    . Replacement total knee  2013    right  . Shoulder surgery  02/04/13  . Cardiac catheterization  06/11/2013  . Coronary angioplasty    . Electrophysiologic study N/A 07/25/2015    Procedure: CARDIOVERSION;  Surgeon:  Antonieta Iba, MD;  Location: [facility] ORS;  Service: Cardiovascular;  Laterality: N/A;     Allergies  Allergies  Allergen Reactions  . Oxycodone Nausea And Vomiting    Inpatient Medications    . antiseptic oral rinse  7 mL Mouth Rinse q12n4p  . atorvastatin  20 mg Oral q1800  . chlorhexidine  15 mL Mouth Rinse BID  . heparin  1,000 Units Intravenous Once  . insulin aspart  0-20 Units Subcutaneous Q4H  . levothyroxine  75 mcg Intravenous QAC breakfast  . piperacillin-tazobactam (ZOSYN)  IV  2.25 g Intravenous Q8H  . sodium chloride  500 mL Intravenous Once    Family History    Family History  Problem Relation Age of Onset  . Esophageal cancer Mother   . Diabetes Mother   . Cancer Mother     esophageal     Social History    Social History   Social History  . Marital Status: Married    Spouse Name: N/A  . Number of Children: 3  . Years of Education: N/A   Occupational History  . retired    Social History Main Topics  . Smoking status: Never Smoker   . Smokeless tobacco: Never Used     Comment: former passive smoker  . Alcohol Use: No  . Drug Use: No  . Sexual Activity: No   Other Topics Concern  . Not on file   Social History Narrative   Widow; lives in [place]. Three children. 4 grandchildren; 4 great grandchildren   Regular exercise: yes   Caffeine use: tea occasionally     Review of Systems    General:  No chills, fever, night sweats or weight changes. Positive for generalized pain and tremor. Cardiovascular:  No chest pain, dyspnea on exertion, edema, orthopnea, palpitations, paroxysmal nocturnal dyspnea. Dermatological: No rash, lesions/masses Respiratory: No cough, dyspnea Urologic: No hematuria, dysuria Abdominal:   No nausea, vomiting, diarrhea, bright red blood per rectum, melena, or hematemesis Neurologic:  No visual changes. Positive for altered mental status.  All other systems reviewed and are otherwise negative except as noted  above.  Physical Exam    Blood pressure 132/45, pulse 64, temperature 98.4 F (36.9 C), temperature source Oral, resp. rate 13, height 5\' 2"  (1.575 m), weight 245 lb 13 oz (111.5 kg), SpO2 100 %.  General: Pleasant, overweight Caucasian female appearing in NAD. Psych: Normal affect. Neuro: Alert and oriented X 2. Moves all extremities spontaneously. HEENT: Normal  Neck: Supple without bruits or JVD. Lungs:  Resp regular and unlabored, CTA without wheezing or rales anteriorly. Heart: RRR no s3, s4, or murmurs. Abdomen: Soft, non-tender, non-distended, BS + x 4.  Extremities: No clubbing or cyanosis. 1+ edema along upper and lower extremities. DP/PT/Radials 2+ and equal bilaterally. Wound vac present on  right hip.  Labs    Troponin (Point of Care Test) No results for input(s): TROPIPOC in the last 72 hours.  Recent Labs  11/16/15 2325  TROPONINI 0.06*   Lab Results  Component Value Date   WBC 23.4* November 28, 2015   HGB 8.4* 11-28-15   HCT 25.7* 11/28/15   MCV 88.3 Nov 28, 2015   PLT 457* November 28, 2015     Recent Labs Lab 11/16/15 2325 2015/11/28 0500  NA 127* 126*  K 5.3* 5.3*  CL 103 97*  CO2 17* 19*  BUN 45* 44*  CREATININE 4.51* 4.54*  CALCIUM 6.8* 6.9*  PROT 5.0*  --   BILITOT 0.5  --   ALKPHOS 108  --   ALT 8*  --   AST 15  --   GLUCOSE 277* 206*   Lab Results  Component Value Date   CHOL 185 10/10/2015   HDL 47.40 10/10/2015   LDLCALC 108* 10/10/2015   TRIG 149.0 10/10/2015   No results found for: [facility]   Radiology Studies    Dg Chest Port 1 View: 28-Nov-2015  CLINICAL DATA:  Respiratory failure, sepsis, CHF. EXAM: PORTABLE CHEST 1 VIEW COMPARISON:  Portable chest x-ray dated November 14, 2015 FINDINGS: The lungs are adequately inflated. The interstitial markings are mildly increased. The pulmonary vascularity is prominent. The cardial pericardial silhouette is enlarged. There is partial obscuration of the left hemidiaphragm. The right internal jugular venous  catheter tip projects over the midportion of the SVC. IMPRESSION: Allowing for differences in radiographic technique there has been mild interval deterioration in the pulmonary interstitium suggesting increasing interstitial edema. Stable cardiomegaly. Slight increased atelectasis or infiltrate at the left lung base. Electronically Signed   By: David  [place]az[place]and M.D.   On: November 28, 2015 07:23    EKG & Cardiac Imaging    EKG: Sinus bradycardia, HR 54 and T-wave abnormality in V5 and V6.   Echocardiogram: 07/20/2015 Study Conclusions  - Left ventricle: The cavity size was normal. Systolic function was  normal. The estimated ejection fraction was in the range of 55%  to 65%. Wall motion was normal; there were no regional wall  motion abnormalities. The study is not technically sufficient to  allow evaluation of LV diastolic function. - Mitral valve: Calcified annulus. There was mild regurgitation. - Left atrium: The atrium was mildly dilated. - Right ventricle: Systolic function was normal. - Right atrium: The atrium was mildly dilated. - Pulmonary arteries: Systolic pressure was mildly elevated. PA  peak pressure: 38 mm Hg (S).  Impressions:  - Rhythm is atrial fibrillation.  Assessment & Plan    1. Sinus Bradycardia/ Paroxysmal Atrial Fibrillation - has a history of PAF. Was on Amiodarone  daily and Eliquis prior to admission to [facility]. While there, she was noted to be bradycardiac with HR into the mid-20's at times, requiring initiation of an Isuprel drip. Eliquis held in setting of sepsis and AKI. Amiodarone stopped in setting of bradycardia and hypotension. Notes mention a BB being stopped, but I do not see where she was on a BB PTA. At her most recent Cardiology office visit in 07/2015, she was not on a BB.  - This patients CHA2DS2-VASc Score and unadjusted Ischemic Stroke Rate (% per year) is equal to 12.2 % stroke rate/year from a score of 9 (CHF, HTN, DM, Vascular,  Female, Age (2), CVA(2)). Continue Heparin per pharmacy consult. - while at [facility], her initial EKG was read as a junctional rhythm with HR of 54. It does appear  occasional p-waves are noted, therefore more consistent with sinus bradycardia. On telemetry, she is currently in NSR with HR in 50's - 60's with definitive p-waves noted. A repeat EKG was again obtained and read as junctional rhythm, yet in review with Dr. Excell Seltzer, p-waves are noted. On examination, she displays a tremor of her upper extremities and this could be contributing to the wavy baseline on her EKG.  - would continue to hold Amiodarone in the setting of hypotension, as she is currently on Dopamine 10 cg/kg/min. Would resume once BP normalizes, for with her acute illness, she has a higher likelihood of going into atrial fibrillation with RVR.   2. Chronic Diastolic CHF - recent echo in 81/1572 showing a preserved EF of 55-65% with no wall motion abnormalities. - receiving IVF in the setting of sepsis and hypotension. Will require diuresis once clinical status improves. - PTA Torsemide, ACE-I, and BB held.   3. Acute on Chronic Stage 3 CKD - creatinine 4.54 on admission (was 1.09 four weeks ago).  - Nephrology following.  4. Sepsis - per admitting team  Signed, Ellsworth Lennox, PA-C 11/17/2015, 12:56 PM Pager: [phone]  Patient seen, examined. Available data reviewed. Agree with findings, assessment, and plan as outlined by [name], PA-C. On exam, she is obese, lethargic, in NAD. JVP normal, no carotid bruits, lungs CTA, heart RRR without murmur, abdomen soft/NT, extremities without edema. Tele, outside record review shows sinus brady, with episodes of marked sinus brady prior to arrival here with HR < 40 bpm. EKG shows sinus rhythm. P-waves are difficult to see in many leads, but rhythm is sinus throughout. Agree with holding beta-blocker and amiodarone at this time. Will need to continue to watch closely on  tele as her risk of recurrent AF will be high in the setting of critical illness. Agree with holding Eliquis as patient has AKI with creatinine > 4 mg/dL. Outside records, echo study, labs, radiographic data all reviewed as part of this evaluation. Will follow with you.  Tonny Bollman, M.D. 11/17/2015 3:45 PM

## 2015-11-17 NOTE — Consult Note (Addendum)
WOC consulted, patient from [facility] for critical care needs with NPWT VAC to the right hip. Spoke with CCM and they have consulted orthopedic surgeon who will evaluate this patient some time today.  I have ordered VAC dressing kit and notified bedside nurse that if orthopedics come in after 330 or so WOC will most likely not be on this campus.  Bedside nurse will need to replace if needed.  Bedside nurse has already hooked patient to hospital VAC machine. Will add NPWT orders accordingly. CCM agreeable as well.  [place] nurse will place on my follow up list and check on status of patient Monday.   Anel Purohit [facility] MSN, [place], [place], [place]

## 2015-11-17 NOTE — Procedures (Signed)
Central Venous Catheter Insertion Procedure Note AGAPE HARDIMAN [id number] 1934-07-25  Procedure: Insertion of Central Venous Catheter (hemodialysis) Indications: CRRT  Procedure Details Consent: Risks of procedure as well as the alternatives and risks of each were explained to the (patient/caregiver).  Consent for procedure obtained. Time Out: Verified patient identification, verified procedure, site/side was marked, verified correct patient position, special equipment/implants available, medications/allergies/relevent history reviewed, required imaging and test results available.  Performed  Maximum sterile technique was used including antiseptics, cap, gloves, gown, hand hygiene, mask and sheet. Skin prep: Chlorhexidine; local anesthetic administered A antimicrobial bonded/coated triple lumen catheter (HD catheter, 20 cm, trialysis) was placed in the left internal jugular vein using the Seldinger technique. (existing CVC in RIJ)  Evaluation Blood flow good Complications: No apparent complications Patient did tolerate procedure well. Chest X-ray ordered to verify placement.  CXR: pending.  Florina Glas 11/17/2015, 11:36 PM

## 2015-11-17 NOTE — Progress Notes (Addendum)
PULMONARY / CRITICAL CARE MEDICINE   Name: [name] [name] MRN: [id number] DOB: November 06, 1934    ADMISSION DATE:  11/16/2015 CONSULTATION DATE:  11/16/15  REFERRING MD:  [facility]  CHIEF COMPLAINT:  Sepsis  HISTORY OF PRESENT ILLNESS:  Pt is encephelopathic; therefore, this HPI is obtained from chart review. [name] [name] is a 81 y.o. female with PMH as outlined below including recent right hip hemiarthroplasty roughly 10/27/15 complicated by infected surgical site.  She was admitted again to [facility] on 6/27 and underwent I&D of the right hip with implantation of spacer and wound vac and was started on empiric vanc / zosyn.  In addition, she had severe bradycardia with HR in the 20's.  She was seen in consultation by cardiology who felt that bradycardia was related to amiodarone and metoprolol; therefore, these were discontinued and pt was started on Isuprel.  Her hospital stay was complicated by anemia for which she received 4u PRBC transfusion along with worsening renal failure. She was transferred to [facility] 6/29 for further evaluation and management.  SUBJECTIVE:  No acute events overnight. Remained on BiPAP overnight. Patient nods no to any pain or difficulty breathing. Remains on vasopressor support with Dopamine.  REVIEW OF SYSTEMS:  Unable to assess with BiPAP mask in place.  VITAL SIGNS: BP 118/44 mmHg  Pulse 52  Temp(Src) 96.6 F (35.9 C) (Axillary)  Resp 9  Ht 5\' 2"  (1.575 m)  Wt 111.5 kg (245 lb 13 oz)  BMI 44.95 kg/m2  SpO2 100%  HEMODYNAMICS: CVP:  [15 mmHg-17 mmHg] 16 mmHg  VENTILATOR SETTINGS: Vent Mode:  [-] BIPAP FiO2 (%):  [30 %] 30 % Set Rate:  [10 bmp] 10 bmp  INTAKE / OUTPUT: I/O last 3 completed shifts: In: 574.1 [I.V.:574.1] Out: -    PHYSICAL EXAMINATION: General: Obese female. No distress. Currently on BiPAP. Neuro: Awake. Follows commands. Moving all distal 4 extremities equally. HEENT: BiPAP mask in place. No scleral icterus or  injection. Cardiovascular: Bradycardic with junctional rhythm on telemetry. Unable to appreciate JVD with body habitus. Mild anasarca. Lungs: Normal work of breathing on BiPAP. Distant breath sounds bilaterally. Abdomen: Soft. Protuberant. Nontender. Musculoskeletal: Wound VAC in place over right hip. No other joint deformity appreciated. Skin: Warm and dry. No rash on exposed skin.  LABS:  BMET  Recent Labs Lab 11/16/15 2325 11/17/15 0500  NA 127* 126*  K 5.3* 5.3*  CL 103 97*  CO2 17* 19*  BUN 45* 44*  CREATININE 4.51* 4.54*  GLUCOSE 277* 206*    Electrolytes  Recent Labs Lab 11/16/15 2325 11/17/15 0500  CALCIUM 6.8* 6.9*  MG 1.1*  --   PHOS 5.7*  --     CBC  Recent Labs Lab 11/16/15 2325 11/17/15 0500  WBC 16.7* 23.4*  HGB 8.9* 8.4*  HCT 27.2* 25.7*  PLT 344 457*    Coag's  Recent Labs Lab 11/16/15 2325 11/17/15 0500  APTT  --  38*  INR 1.36  --     Sepsis Markers  Recent Labs Lab 11/16/15 2325 11/17/15 0049 11/17/15 0500  LATICACIDVEN 2.3*  --   --   PROCALCITON  --  0.60 0.64    ABG  Recent Labs Lab 11/16/15 2312 11/16/15 2356  PHART 7.268* 7.315*  PCO2ART 32.8* 31.3*  PO2ART 103.0* 119.0*    Liver Enzymes  Recent Labs Lab 11/16/15 2325  AST 15  ALT 8*  ALKPHOS 108  BILITOT 0.5  ALBUMIN 1.5*    Cardiac Enzymes  Recent  Labs Lab 11/16/15 2325  TROPONINI 0.06*    Glucose  Recent Labs Lab 11/16/15 2208 11/17/15 0019 11/17/15 0317  GLUCAP 284* 285* 239*    Imaging No results found.   STUDIES:  TTE 07/20/15:Atrial fibrillation. LVEF 55-60%. Normal wall motion. Unable to assess diastolic function. Mild mitral regurgitation. RV normal in size and function. Pulmonary artery systolic pressure 38 mmHg. EKG 6/29:  Junctional bradycardia. Port CXR 6/30:  Right hilar fullness with silhouetting of the left hemidiaphragm suggestive of retrocardiac opacity. Globular heart with suggestion of  cardiomegaly.  MICROBIOLOGY [facility]): Blood Ctx x2 6/29>> R Hip Ctx 6/29>> (poss Staph aureus & 2 GNR)  ANTIBIOTICS: Vancomycin 06/27>> Zozyn 06/27>>   SIGNIFICANT EVENTS: 06/09 - right hip hemiarthroplasty 06/27 - admitted with infected right hip to [facility].  Taken to OR for I&D and spacer implantation. 06/29 - Worsened renal function.  [facility]red to [facility] for further evaluation and management.  LINES/TUBES: R IJ CVL [facility]) > L radial A line [facility]) > FOLEY [facility]) >  ASSESSMENT / PLAN:  INFECTIOUS A:   Sepsis - Due to infected right hip; s/p I&D 11/14/15. R Hip Infection Possible LLL HCAP  P:   Empiric Vancomycin & Zosyn Follow cultures from [place]. Trending Procalcitonin per algorithm Plan to re-culture for fever  CARDIOVASCULAR A:  Bradycardia - Reportedly 3rd degree AVB at [facility]. H/O PAF - On outpt Eliquis. H/O HTN H/O CAD H/O HLD  P:  Goal MAP > 65 & SBP > 90 Dopamine gtt to maintain HR >50, MAP & SBP. Heparin gtt per pharmacy protocol Continuing outpt Lipitor Hold outpatient amiodarone, amlodipine & lisinopril. Consulting Cardiology Checking TTE  RENAL A:   Acute Renal Failure NAGMA Hyponatremia  Pseudo-Hypocalcemia - S/P 1gm Calcium Gluconate. Corrected w/ Albumin to 8.9.  P:   Bicarb gtt 75cc/hr Trending UOP with Foley Monitoring electrolytes & renal function daily Repeat BMP @ 1700 today Replacing electrolytes as indicated Consulting Nephrology UA & Urine Lytes stat Stat Serum Osm Renal Ultrasound  PULMONARY A: Acute Hypoxic Respiratory Failure Possible LLL HCAP  P:   Weaning FiO2 for Sat >92% Continuous Pulse Ox BiPAP prn work of breathing  GASTROINTESTINAL A:   H/O GERD Morbid obesity  P:   NPO Protonix PO daily  HEMATOLOGIC A:   Leukocytosis - Likely secondary to sepsis. Worsening. Anemia - No signs of active bleeding. Hgb stable. Chronic Anticoagulation - Eliquis as outpatient.  P:   Trending cell counts daily w/ CBC Heparin gtt per pharmacy protocol Holding Eliquis  ENDOCRINE A:   H/O DM Type 2 H/O Hypothyroidism - TSH 1.861.  P:   Accu-Checks q4hr SSI per Resistant Algorithm Synthroid IV daily Hold outpatient Lantus & Metformin.  NEUROLOGIC A:   H/O Anxiety/Depression H/O Neurogenic Bladder - Chronic foley placed at [facility].  P:   Monitor mental status closely for withdrawal Hold outpatient alprazolam, gabapentin, sertraline.  ORTHOPEDICS A: Septic Right Hip - S/P I/D 6/27 w/ spacer & wound vac. R Fem Fracture - S/P R Hip Hemiarthroplasty 6/9.   P: Consulting Orthopedics Fentanyl IV prn severe pain Wound Care C/S for Wound Vac  Family updated: No family available.  Interdisciplinary Family Meeting v Palliative Care Meeting:  Due by: 11/22/15.   TODAY'S SUMMARY:  81 y.o. F transferred from [place] where she was admitted 6/27 due to infected right hip (right hip arthroplasty performed 6/9).  Patient now has bradycardia from junctional rhythm. Patient requiring low-dose vasopressor support for blood pressure and heart rate. Consult  cardiology for further treatment recommendations. Consult orthopedics given septic joint with wound VAC and spacer in place. Consult nephrology given ongoing renal failure. Patient has multisystem organ failure from her sepsis. Hopefully we will be able to wean her oxygen requirement and avoid intubation.  I have spent a total of 34 minutes of critical care time today caring for the patient and reviewing the patient's electronic medical record.  Donna Christen Jamison Neighbor, M.D. [facility] Pulmonary & Critical Care Pager:  [phone] After 3pm or if no response, call [phone] 11/17/2015, 7:12 AM

## 2015-11-17 NOTE — Care Management Note (Signed)
Case Management Note  Patient Details  Name: [name] [name] MRN: [id number] Date of Birth: 01/28/1935  Subjective/Objective:      recently had a right hip hemiarthroplasty ~10/27/15 complicated by an infection of the surgical site. On 6/27 she presented again to [facility] and had an I&D of the right hip with implantation fo spacer and wound vac + initiation of broad spectrum Abx (Vanc + Zosyn). She also had bradycardia with rates in the 20's thought by cardiology to be secondary to amiodarone and metoprolol --> initiation of Isoproterenol. She was also transfused 4U PRBC during that hospitalization with worsening renal function leading to transfer to [facility] on 6/29. She was started on Dopamine and UOP was noted to be very low with a Cr of 4.54, mild hyperkalemia and a nongap metabolic acidosis at which Nephrology was consulted. I am unable to obtain an accurate history bec of the pt's mental status and history obtained via chart.    Plan is to intiate CRRT             Action/Plan:  PT will assess pt for safe discharge plan when medically stable   Expected Discharge Date:                  Expected Discharge Plan:  Skilled Nursing Facility  In-House Referral:     Discharge planning Services  CM Consult  Post Acute Care Choice:    Choice offered to:     DME Arranged:    DME Agency:     HH Arranged:    HH Agency:     Status of Service:  In process, will continue to follow  If discussed at Long Length of Stay Meetings, dates discussed:    Additional Comments:  Cherylann Parr, RN 11/17/2015, 3:26 PM

## 2015-11-17 NOTE — Progress Notes (Signed)
Pharmacy Antibiotic Note  [name] [name] is a 81 y.o. female admitted on 11/16/2015 with septic joint.  Pharmacy has been consulted for vancomycin and zosyn dosing.  Presenting from [place] with concern for sepsis. recent high hemiarthroplasty with surgical site infection (I&D on 6/27 with spacer and wound vac). Put on isuprel after HR in 20s, anemia, and renal failure  ID: abx for surgical infx. WBC 23.4, PCT 0.64, AF  Plan: Hold vanc 1500 VR Sat (72 hr lvl) Zosyn 2.25 gm IV q8h  Height: 5\' 2"  (157.5 cm) Weight: 245 lb 13 oz (111.5 kg) IBW/kg (Calculated) : 50.1  Temp (24hrs), Avg:96.7 F (35.9 C), Min:94.6 F (34.8 C), Max:98.4 F (36.9 C)   Recent Labs Lab 11/16/15 2325 11/17/15 0500 11/17/15 1200  WBC 16.7* 23.4*  --   CREATININE 4.51* 4.54*  --   LATICACIDVEN 2.3*  --   --   VANCORANDOM  --   --  36    Estimated Creatinine Clearance: 12 mL/min (by C-G formula based on Cr of 4.54).    Allergies  Allergen Reactions  . Oxycodone Nausea And Vomiting    Antimicrobials this admission: Zosyn 6/27>> Vancomycin 6/27>>  Vanc 1500 mg 6/27 ~ 1500, 6/29 ~ 1500 @ OSH  Dose adjustments this admission: 6/30 VR - 36  Microbiology results: Blood NGTD Hip Cx Staph aureus, GNR  MRSA PCR neg  37, PharmD, BCPS, [facility] Clinical Pharmacist Pager [phone] 11/17/2015 2:07 PM

## 2015-11-17 NOTE — Progress Notes (Signed)
ANTICOAGULATION/ANTIBIOTIC CONSULT NOTE - Initial Consult  Pharmacy Consult for heparin and Vancocin/Zosyn Indication: atrial fibrillation and sepsis  Allergies  Allergen Reactions  . Oxycodone Nausea And Vomiting    Patient Measurements: Height: 5\' 2"  (157.5 cm) Weight: 209 lb 3.5 oz (94.9 kg) IBW/kg (Calculated) : 50.1 Heparin Dosing Weight: 75kg  Vital Signs: Temp: 96.1 F (35.6 C) (06/30 0017) Temp Source: Axillary (06/30 0017) BP: 130/37 mmHg (06/29 2310) Pulse Rate: 54 (06/29 2310)   Medical History: Past Medical History  Diagnosis Date  . DM (diabetes mellitus) (HCC)   . PAF (paroxysmal atrial fibrillation) (HCC)     a. s/p dccv 04/2014;  b. amio/eliquis;  c. 06/2013 bb/ccb d/c 2/2 symptomatic bradycardia; d. 04/2015 recurrent AF->amio increased/bb resumed.  . Dyslipidemia   . CAD (coronary artery disease)     a. 2000 s/p PCI RCA;  b. 2005 PCI of RCA 2/2 ISR; c. 04/2013 Neg MV;  d. 05/2013 Cath: LM 20, LAD 30p, LCX 20p, OM1 90 small, RCA 87m (PTCA - FFR 0.86), PDA 40.  . Osteoarthritis   . Hypothyroidism     hx  . Rheumatoid arthritis(714.0)   . Obesity   . GERD (gastroesophageal reflux disease)   . Shoulder fracture, left     a. Dr. 72m  . Cerebral infarction (HCC)   . CKD (chronic kidney disease), stage III   . Asthma   . Chronic diastolic CHF (congestive heart failure) (HCC)     a. 10/2013 Echo: EF 60-65%, no rwma, Gr1 DD, mild MR, mildly dil LA.  11/2013 Hypertensive heart disease   . Right rib fracture     a. 04/2015.     Assessment: 81yo female tx'd to MICU from [facility] for severe sepsis after admission for infected THA that was initially tx'd as outpt w/ Keflex and clinda, went to OR to remove infected hardware w/ placement of tobra/vanc ortho cement w/ plan for 6wk of IV ABX;  Since 6/27 has been on Zosyn 3.375g IV Q12H (4hr infusion, last dose 6/29 1444) and vancomycin 1500mg  IV Q48H (last dose 6/29 1546; random vanc level of 14.6 was drawn  ~36hr after first dose).  Also to begin heparin for Afib, on Eliquis PTA, held for OR at OSH, post-op was on SCDs.  6/29 Labs from OSH: WBC 17.9, Hgb 8.2, Plt 443, K 5.2, SCr 4.3 (up from 3 6/27, in ARF, was 1.15 in May 2017)  Goal of Therapy:  Heparin level 0.3-0.7 units/ml aPTT 66-102 seconds Vanc trough 15-20 Monitor platelets by anticoagulation protocol: Yes   Plan:  Will change Zosyn to 2.25g IV Q8H and f/u after renal consult for further vanc dosing.  Will begin heparin gtt at 1000 units/hr and monitor heparin levels, aPTT (apixiban is likely still affecting anti-Xa w/ renal failure), and CBC.  7/27, PharmD, BCPS  11/17/2015,12:20 AM

## 2015-11-17 NOTE — Progress Notes (Signed)
   11/17/15 1000  Clinical Encounter Type  Visited With Patient  Visit Type Initial;Spiritual support  Spiritual Encounters  Spiritual Needs Prayer  Stress Factors  Patient Stress Factors None identified   Chaplain visited with patient while doing rounds on the floor. Patient was unresponsive. Chaplain prayed for patient.  Rosezella [place] Xyla Leisner 11/17/2015 10:24 AM

## 2015-11-17 NOTE — Progress Notes (Signed)
ANTICOAGULATION CONSULT NOTE - Follow Up Consult  Pharmacy Consult for heparin Indication: atrial fibrillation  Allergies  Allergen Reactions  . Oxycodone Nausea And Vomiting    Patient Measurements: Height: 5\' 2"  (157.5 cm) Weight: 245 lb 13 oz (111.5 kg) IBW/kg (Calculated) : 50.1 Heparin Dosing Weight: 75 kg  Vital Signs: Temp: 98.1 F (36.7 C) (06/30 2013) Temp Source: Oral (06/30 2013) BP: 126/47 mmHg (06/30 2000) Pulse Rate: 64 (06/30 2000)  Labs:  Recent Labs  11/16/15 2325 11/17/15 0500 11/17/15 1100 11/17/15 1620 11/17/15 2035  HGB 8.9* 8.4*  --   --   --   HCT 27.2* 25.7*  --   --   --   PLT 344 457*  --   --   --   APTT  --  38* 46*  --  71*  LABPROT 16.8*  --   --   --   --   INR 1.36  --   --   --   --   HEPARINUNFRC  --  1.12* 1.00*  --   --   CREATININE 4.51* 4.54*  --  4.68*  --   TROPONINI 0.06*  --   --   --   --     Estimated Creatinine Clearance: 11.7 mL/min (by C-G formula based on Cr of 4.68).  Medication Infusion: . dextrose 5 % and 0.45% NaCl 50 mL/hr at 11/17/15 2000  . DOPamine 10.004 mcg/kg/min (11/17/15 2000)  . heparin Stopped (11/17/15 2020)  .  sodium bicarbonate 150 mEq in sterile water 1000 mL infusion 75 mL/hr at 11/17/15 2000    Assessment: 81 yo f presenting from [place] with concern for sepsis. recent high hemiarthroplasty with surgical site infection (I&D on 6/27 with spacer and wound vac). Put on isuprel after HR in 20s, anemia, and renal failure  PMH: DM, AFib on apixaban, HLD, CAD, RA  AC/heme: on apixaban for Afib pta - held for OR at OSH. Was on heparin drip but this was stopped at 20:20 to be able to place HD cath. PTT was drawn shortly after heparin turned off (20:35) so will resume at 1150 units/hr as able. No bleeding noted.  Goal of Therapy:  Heparin level 0.3-0.7 units/ml aPTT 66 - 102 s seconds Monitor platelets by anticoagulation protocol: Yes   Plan:  Heparin drip on hold for HD cath placement - f/u  resumption  Daily HL, aPTT, CBC (can d/c aptt once correlating)  7/27, Pharm.D., BCPS Clinical Pharmacist Pager: [phone] 11/17/2015 9:18 PM

## 2015-11-17 NOTE — Progress Notes (Signed)
ANTICOAGULATION CONSULT NOTE - Follow Up Consult  Pharmacy Consult for heparin Indication: atrial fibrillation  Allergies  Allergen Reactions  . Oxycodone Nausea And Vomiting    Patient Measurements: Height: 5\' 2"  (157.5 cm) Weight: 245 lb 13 oz (111.5 kg) IBW/kg (Calculated) : 50.1 Heparin Dosing Weight: 75 kg  Vital Signs: Temp: 98.4 F (36.9 C) (06/30 1103) Temp Source: Oral (06/30 1103) BP: 132/45 mmHg (06/30 1200) Pulse Rate: 64 (06/30 1200)  Labs:  Recent Labs  11/16/15 2325 11/17/15 0500 11/17/15 1100  HGB 8.9* 8.4*  --   HCT 27.2* 25.7*  --   PLT 344 457*  --   APTT  --  38* 46*  LABPROT 16.8*  --   --   INR 1.36  --   --   HEPARINUNFRC  --  1.12* 1.00*  CREATININE 4.51* 4.54*  --   TROPONINI 0.06*  --   --     Estimated Creatinine Clearance: 12 mL/min (by C-G formula based on Cr of 4.54).  Assessment: 81 yo f presenting from [place] with concern for sepsis. recent high hemiarthroplasty with surgical site infection (I&D on 6/27 with spacer and wound vac). Put on isuprel after HR in 20s, anemia, and renal failure  PMH: DM, AFib on apixaban, HLD, CAD, RA  AC: on apixaban for Afib pta - held for OR at OSH. Now on heparin gtt 1000 unit/hr. HL 1, aPTT 46  Nephro: SCr 4.54 (AKI), K 5.3  Heme: H&H 8.4/25.7, Plt 457  Goal of Therapy:  Heparin level 0.3-0.7 units/ml aPTT 66 - 102 s seconds Monitor platelets by anticoagulation protocol: Yes   Plan:  Heparin bolus 1000 units Increase gtt to 1150 units/hr aPTT at 2000 Daily HL, aPTT, CBC (can d/c aptt once correlating)  06-01-1979, PharmD, BCPS, [facility] Clinical Pharmacist Pager [phone] 11/17/2015 12:45 PM

## 2015-11-17 NOTE — Progress Notes (Signed)
eLink Physician-Brief Progress Note Patient Name: [name] DOB: [birth year] MRN: [id number]   Date of Service  11/17/2015  HPI/Events of Note  Min response to bolus fluid D/w ccm and nephro today k still borderline concerning to rise Will place HD cath Holding hep drip for this  eICU Interventions       Intervention Category Major Interventions: Acid-Base disturbance - evaluation and management  Nelda Bucks. 11/17/2015, 8:17 PM

## 2015-11-17 NOTE — Consult Note (Signed)
Reason for Consult: Acute Kidney Injury Referring Physician: Dr. Tera Partridge  Chief Complaint: Sepsis  Assessment/Plan: 1. Acute Kidney Injury on CKD III with a baseline Cr ~1.1-1.3. She has reasons to be in ATN with sepsis + likely  Hypotension during periods of severe bradycardia. I am not sure if she received contrast at [facility]. She is already Stage III AKI by KDIGO based on a 3x increase from BL Cr. - I reviewed the urine microscopy personally and do not see pigmented casts suggestive of ATN but by history she has every reason to be in ATN. There are NUMEROUS WBC's in the urine but no WBC casts, only a few RBC's which are normomorphic, + yeast. WBC's may be from the chronic indwelling catheter; AIN is lower on the differential. - Ultrasound already req to r/o obstructive uropathy but less likely with the foley in place. - Pt also hyperkalemic with no EKG changes. - Will follow up on the urine studies but will bolus with another 536m of NS. - Likely will need to start RRT within the next 24hrs --> would prefer CRRT if there is no e/o renal recovery given her soft blood pressures + sepsis. - May continue D5W+3amps HCO3 --> will d/c once initiated on CRRT. - D/W Dr. NAshok Cordia--> If pt does not respond to fluids + if urine outpt is still minimal to none + repeat chemistry later today shows persistent hyperkalemia then CRRT will be initiated later this evening.  2. Hyponatremia - Hyponatremia which is likely secondary to the Acute Kidney Injury. Will also follow up on the renal studies; she will not need an urine osmolarity with the ongoing AKI.  3. Sepsis - most likely due to the right hip with recent I&D on 11/14/15 --> on broad spectrum Abxs. 4. Bradycardia - appears to have resolved holding the offending medications. 5. Right hip arthroplasty and I&D w/ revision. 6. Hypothyroidism 7. DM    HPI: [name] CALOCAis an 81y.o. female with a history of DM, PAF, CASHD, CHF, Hypothyroidism  and  w/ a baseline creatinine of 1.1-1.3  who recently had a right hip hemiarthroplasty ~~3/1/54complicated by an infection of the surgical site. [facility] with implantation fo spacer and wound vac + initiation of broad spectrum Abx (Vanc + Zosyn). She also had bradycardia with rates in the 20's thought by cardiology to be secondary to amiodarone and metoprolol --> initiation of Isoproterenol. She was also transfused 4U PRBC during that hospitalization with worsening renal function [facility] She was started on Dopamine and UOP was noted to be very low with a Cr of 4.54, mild hyperkalemia and a nongap metabolic acidosis at which Nephrology was consulted. I am unable to obtain an accurate history bec of the pt's mental status and history obtained via chart.  She also has a history of a chronic foley bec of a neurogenic bladder.  ROS Review of systems not obtained due to patient factors.  Chemistry and CBC: CREATININE  Date/Time Value Ref Range Status  05/18/2014 04:37 AM 1.01 0.60-1.30 mg/dL Final  05/16/2014 10:08 AM 1.29 0.60-1.30 mg/dL Final  02/06/2014 12:15 PM 1.20 0.60-1.30 mg/dL Final  06/10/2013 10:04 PM 1.23 0.60-1.30 mg/dL Final  05/18/2013 05:01 AM 0.87 0.60-1.30 mg/dL Final  05/17/2013 10:27 AM 0.78 0.60-1.30 mg/dL Final  02/04/2013 04:59 PM 1.02 0.60-1.30 mg/dL Final  02/02/2013 11:21 AM 0.89 0.60-1.30 mg/dL Final  10/01/2012 12:35 PM 0.65 0.60-1.30 mg/dL Final  01/14/2012 04:18 AM 1.14 0.60-1.30 mg/dL Final  01/13/2012 02:52 PM 1.10 0.60-1.30 mg/dL Final  01/11/2012 04:37 AM 1.17 0.60-1.30 mg/dL Final  01/09/2012 05:07 AM 0.82 0.60-1.30 mg/dL Final  12/31/2011 12:28 PM 0.79 0.60-1.30 mg/dL Final   CREATININE, SER  Date/Time Value Ref Range Status  11/17/2015 05:00 AM 4.54* 0.44 - 1.00 mg/dL Final  11/16/2015 11:25 PM 4.51* 0.44 - 1.00 mg/dL Final  10/20/2015 12:31 PM 1.09 0.40 - 1.20  mg/dL Final  10/10/2015 01:55 PM 1.15 0.40 - 1.20 mg/dL Final  08/30/2015 01:59 PM 1.38* 0.40 - 1.20 mg/dL Final  08/15/2015 02:55 PM 1.35* 0.57 - 1.00 mg/dL Final  08/04/2015 02:17 PM 1.24* 0.40 - 1.20 mg/dL Final  07/27/2015 04:23 AM 1.29* 0.44 - 1.00 mg/dL Final  07/25/2015 04:06 AM 1.02* 0.44 - 1.00 mg/dL Final  07/23/2015 04:53 AM 1.28* 0.44 - 1.00 mg/dL Final  07/22/2015 06:29 AM 1.51* 0.44 - 1.00 mg/dL Final  07/21/2015 04:37 AM 1.68* 0.44 - 1.00 mg/dL Final  07/20/2015 04:32 AM 1.15* 0.44 - 1.00 mg/dL Final  07/19/2015 01:16 PM 1.10* 0.44 - 1.00 mg/dL Final  07/07/2015 01:35 PM 1.16 0.40 - 1.20 mg/dL Final  06/15/2015 11:00 AM 1.09 0.40 - 1.20 mg/dL Final  05/07/2015 10:26 PM 0.90 0.44 - 1.00 mg/dL Final  04/06/2015 02:04 PM 1.03 0.40 - 1.20 mg/dL Final  12/07/2014 10:58 AM 1.04 0.40 - 1.20 mg/dL Final  12/01/2014 08:14 AM 1.23* 0.40 - 1.20 mg/dL Final  08/31/2014 01:44 PM 0.98 0.40 - 1.20 mg/dL Final  08/17/2014 10:50 AM 1.04 0.40 - 1.20 mg/dL Final  08/15/2014 03:43 PM 1.16 0.40 - 1.20 mg/dL Final  06/27/2014 02:07 PM 1.31* 0.57 - 1.00 mg/dL Final  06/26/2014 04:00 AM 1.09 0.50 - 1.10 mg/dL Final  06/25/2014 01:40 AM 1.26* 0.50 - 1.10 mg/dL Final  06/24/2014 07:53 PM 1.50* 0.50 - 1.10 mg/dL Final  06/24/2014 07:31 PM 1.41* 0.50 - 1.10 mg/dL Final  05/25/2014 12:16 PM 1.4* 0.4 - 1.2 mg/dL Final  05/16/2014 08:32 AM 1.1 0.4 - 1.2 mg/dL Final  03/30/2014 11:47 AM 1.07* 0.57 - 1.00 mg/dL Final  02/17/2014 03:41 PM 1.0 0.4 - 1.2 mg/dL Final  10/22/2013 06:26 AM 0.81 0.50 - 1.10 mg/dL Final  10/21/2013 02:03 PM 0.85 0.50 - 1.10 mg/dL Final  10/06/2013 02:03 PM 0.9 0.4 - 1.2 mg/dL Final  08/04/2013 10:37 AM 0.9 0.4 - 1.2 mg/dL Final  07/01/2013 02:29 PM 0.9 0.4 - 1.2 mg/dL Final  06/21/2013 03:47 PM 0.9 0.4 - 1.2 mg/dL Final  01/14/2013 11:58 AM 0.7 0.4 - 1.2 mg/dL Final  11/19/2012 12:10 PM 0.8 0.4 - 1.2 mg/dL Final  09/21/2012 11:31 AM 0.8 0.4 - 1.2 mg/dL Final  04/08/2012  03:20 PM 0.9 0.4 - 1.2 mg/dL Final  02/04/2012 10:51 AM 1.0 0.4 - 1.2 mg/dL Final  10/17/2011 02:16 PM 0.7 0.4 - 1.2 mg/dL Final  08/21/2011 10:12 AM 0.9 0.4 - 1.2 mg/dL Final  04/25/2011 11:09 AM 0.9 0.4 - 1.2 mg/dL Final  02/21/2011 10:59 AM 0.8 0.4 - 1.2 mg/dL Final  01/28/2009 05:05 AM 0.90 0.4 - 1.2 mg/dL Final  01/26/2009 04:58 PM 0.95 0.4 - 1.2 mg/dL Final  03/04/2007 02:01 PM 0.8  Final  10/02/2006 01:16 PM 0.8 0.4-1.2 mg/dL Final    Recent Labs Lab 11/16/15 2325 11/17/15 0500  NA 127* 126*  K 5.3* 5.3*  CL 103 97*  CO2 17* 19*  GLUCOSE 277* 206*  BUN 45* 44*  CREATININE  4.51* 4.54*  CALCIUM 6.8* 6.9*  PHOS 5.7*  --     Recent Labs Lab 11/16/15 2325 11/17/15 0500  WBC 16.7* 23.4*  NEUTROABS 15.5*  --   HGB 8.9* 8.4*  HCT 27.2* 25.7*  MCV 88.3 88.3  PLT 344 457*   Liver Function Tests:  Recent Labs Lab 11/16/15 2325  AST 15  ALT 8*  ALKPHOS 108  BILITOT 0.5  PROT 5.0*  ALBUMIN 1.5*   No results for input(s): LIPASE, AMYLASE in the last 168 hours. No results for input(s): AMMONIA in the last 168 hours. Cardiac Enzymes:  Recent Labs Lab 11/16/15 2325  TROPONINI 0.06*   Iron Studies: No results for input(s): IRON, TIBC, TRANSFERRIN, FERRITIN in the last 72 hours. PT/INR: '@LABRCNTIP' (inr:5)  Xrays/Other Studies: ) Results for orders placed or performed during the hospital encounter of 11/16/15 (from the past 48 hour(s))  MRSA PCR Screening     Status: None   Collection Time: 11/16/15 10:06 PM  Result Value Ref Range   MRSA by PCR NEGATIVE NEGATIVE    Comment:        The GeneXpert MRSA Assay (FDA approved for NASAL specimens only), is one component of a comprehensive MRSA colonization surveillance program. It is not intended to diagnose MRSA infection nor to guide or monitor treatment for MRSA infections.   Glucose, capillary     Status: Abnormal   Collection Time: 11/16/15 10:08 PM  Result Value Ref Range   Glucose-Capillary 284  (H) 65 - 99 mg/dL  I-STAT 3, arterial blood gas (G3+)     Status: Abnormal   Collection Time: 11/16/15 11:12 PM  Result Value Ref Range   pH, Arterial 7.268 (L) 7.350 - 7.450   pCO2 arterial 32.8 (L) 35.0 - 45.0 mmHg   pO2, Arterial 103.0 (H) 80.0 - 100.0 mmHg   Bicarbonate 15.1 (L) 20.0 - 24.0 mEq/L   TCO2 16 0 - 100 mmol/L   O2 Saturation 97.0 %   Acid-base deficit 11.0 (H) 0.0 - 2.0 mmol/L   Patient temperature 97.4 F    Collection site ARTERIAL LINE    Drawn by RT    Sample type ARTERIAL   Comprehensive metabolic panel     Status: Abnormal   Collection Time: 11/16/15 11:25 PM  Result Value Ref Range   Sodium 127 (L) 135 - 145 mmol/L   Potassium 5.3 (H) 3.5 - 5.1 mmol/L   Chloride 103 101 - 111 mmol/L   CO2 17 (L) 22 - 32 mmol/L   Glucose, Bld 277 (H) 65 - 99 mg/dL   BUN 45 (H) 6 - 20 mg/dL   Creatinine, Ser 4.51 (H) 0.44 - 1.00 mg/dL   Calcium 6.8 (L) 8.9 - 10.3 mg/dL   Total Protein 5.0 (L) 6.5 - 8.1 g/dL   Albumin 1.5 (L) 3.5 - 5.0 g/dL   AST 15 15 - 41 U/L   ALT 8 (L) 14 - 54 U/L   Alkaline Phosphatase 108 38 - 126 U/L   Total Bilirubin 0.5 0.3 - 1.2 mg/dL   GFR calc non Af Amer 9 (L) >60 mL/min   GFR calc Af Amer 10 (L) >60 mL/min    Comment: (NOTE) The eGFR has been calculated using the CKD EPI equation. This calculation has not been validated in all clinical situations. eGFR's persistently <60 mL/min signify possible Chronic Kidney Disease.    Anion gap 7 5 - 15  Magnesium     Status: Abnormal   Collection Time: 11/16/15 11:25  PM  Result Value Ref Range   Magnesium 1.1 (L) 1.7 - 2.4 mg/dL  Phosphorus     Status: Abnormal   Collection Time: 11/16/15 11:25 PM  Result Value Ref Range   Phosphorus 5.7 (H) 2.5 - 4.6 mg/dL  Lactic acid, plasma     Status: Abnormal   Collection Time: 11/16/15 11:25 PM  Result Value Ref Range   Lactic Acid, Venous 2.3 (HH) 0.5 - 1.9 mmol/L    Comment: CRITICAL RESULT CALLED TO, READ BACK BY AND VERIFIED WITH: WILLIS,S RN  11/17/2015 0042 JORDANS   Troponin I     Status: Abnormal   Collection Time: 11/16/15 11:25 PM  Result Value Ref Range   Troponin I 0.06 (HH) <0.03 ng/mL    Comment: CRITICAL RESULT CALLED TO, READ BACK BY AND VERIFIED WITH: WILLIS,S RN 11/17/2015 0120 JORDANS   CBC WITH DIFFERENTIAL     Status: Abnormal   Collection Time: 11/16/15 11:25 PM  Result Value Ref Range   WBC 16.7 (H) 4.0 - 10.5 K/uL   RBC 3.08 (L) 3.87 - 5.11 MIL/uL   Hemoglobin 8.9 (L) 12.0 - 15.0 g/dL   HCT 27.2 (L) 36.0 - 46.0 %   MCV 88.3 78.0 - 100.0 fL   MCH 28.9 26.0 - 34.0 pg   MCHC 32.7 30.0 - 36.0 g/dL   RDW 15.7 (H) 11.5 - 15.5 %   Platelets 344 150 - 400 K/uL   Neutrophils Relative % 93 %   Neutro Abs 15.5 (H) 1.7 - 7.7 K/uL   Lymphocytes Relative 4 %   Lymphs Abs 0.7 0.7 - 4.0 K/uL   Monocytes Relative 3 %   Monocytes Absolute 0.5 0.1 - 1.0 K/uL   Eosinophils Relative 0 %   Eosinophils Absolute 0.0 0.0 - 0.7 K/uL   Basophils Relative 0 %   Basophils Absolute 0.0 0.0 - 0.1 K/uL  Protime-INR     Status: Abnormal   Collection Time: 11/16/15 11:25 PM  Result Value Ref Range   Prothrombin Time 16.8 (H) 11.6 - 15.2 seconds   INR 1.36 0.00 - 1.49  I-STAT 3, arterial blood gas (G3+)     Status: Abnormal   Collection Time: 11/16/15 11:56 PM  Result Value Ref Range   pH, Arterial 7.315 (L) 7.350 - 7.450   pCO2 arterial 31.3 (L) 35.0 - 45.0 mmHg   pO2, Arterial 119.0 (H) 80.0 - 100.0 mmHg   Bicarbonate 16.1 (L) 20.0 - 24.0 mEq/L   TCO2 17 0 - 100 mmol/L   O2 Saturation 98.0 %   Acid-base deficit 9.0 (H) 0.0 - 2.0 mmol/L   Patient temperature 97.4 F    Collection site ARTERIAL LINE    Drawn by RT    Sample type ARTERIAL   Glucose, capillary     Status: Abnormal   Collection Time: 11/17/15 12:19 AM  Result Value Ref Range   Glucose-Capillary 285 (H) 65 - 99 mg/dL  TSH     Status: None   Collection Time: 11/17/15 12:20 AM  Result Value Ref Range   TSH 1.861 0.350 - 4.500 uIU/mL  Procalcitonin -  Baseline     Status: None   Collection Time: 11/17/15 12:49 AM  Result Value Ref Range   Procalcitonin 0.60 ng/mL    Comment:        Interpretation: PCT > 0.5 ng/mL and <= 2 ng/mL: Systemic infection (sepsis) is possible, but other conditions are known to elevate PCT as well. (NOTE)  ICU PCT Algorithm               Non ICU PCT Algorithm    ----------------------------     ------------------------------         PCT < 0.25 ng/mL                 PCT < 0.1 ng/mL     Stopping of antibiotics            Stopping of antibiotics       strongly encouraged.               strongly encouraged.    ----------------------------     ------------------------------       PCT level decrease by               PCT < 0.25 ng/mL       >= 80% from peak PCT       OR PCT 0.25 - 0.5 ng/mL          Stopping of antibiotics                                             encouraged.     Stopping of antibiotics           encouraged.    ----------------------------     ------------------------------       PCT level decrease by              PCT >= 0.25 ng/mL       < 80% from peak PCT        AND PCT >= 0.5 ng/mL             Continuing antibiotics                                              encouraged.       Continuing antibiotics            encouraged.    ----------------------------     ------------------------------     PCT level increase compared          PCT > 0.5 ng/mL         with peak PCT AND          PCT >= 0.5 ng/mL             Escalation of antibiotics                                          strongly encouraged.      Escalation of antibiotics        strongly encouraged.   Glucose, capillary     Status: Abnormal   Collection Time: 11/17/15  3:17 AM  Result Value Ref Range   Glucose-Capillary 239 (H) 65 - 99 mg/dL  CBC     Status: Abnormal   Collection Time: 11/17/15  5:00 AM  Result Value Ref Range   WBC 23.4 (H) 4.0 - 10.5 K/uL   RBC 2.91 (L) 3.87 - 5.11 MIL/uL   Hemoglobin 8.4 (L) 12.0 -  15.0 g/dL   HCT 25.7 (L) 36.0 - 46.0 %  MCV 88.3 78.0 - 100.0 fL   MCH 28.9 26.0 - 34.0 pg   MCHC 32.7 30.0 - 36.0 g/dL   RDW 15.8 (H) 11.5 - 15.5 %   Platelets 457 (H) 150 - 400 K/uL  Basic metabolic panel     Status: Abnormal   Collection Time: 11/17/15  5:00 AM  Result Value Ref Range   Sodium 126 (L) 135 - 145 mmol/L   Potassium 5.3 (H) 3.5 - 5.1 mmol/L   Chloride 97 (L) 101 - 111 mmol/L   CO2 19 (L) 22 - 32 mmol/L   Glucose, Bld 206 (H) 65 - 99 mg/dL   BUN 44 (H) 6 - 20 mg/dL   Creatinine, Ser 4.54 (H) 0.44 - 1.00 mg/dL   Calcium 6.9 (L) 8.9 - 10.3 mg/dL   GFR calc non Af Amer 8 (L) >60 mL/min   GFR calc Af Amer 10 (L) >60 mL/min    Comment: (NOTE) The eGFR has been calculated using the CKD EPI equation. This calculation has not been validated in all clinical situations. eGFR's persistently <60 mL/min signify possible Chronic Kidney Disease.    Anion gap 10 5 - 15  Procalcitonin     Status: None   Collection Time: 11/17/15  5:00 AM  Result Value Ref Range   Procalcitonin 0.64 ng/mL    Comment:        Interpretation: PCT > 0.5 ng/mL and <= 2 ng/mL: Systemic infection (sepsis) is possible, but other conditions are known to elevate PCT as well. (NOTE)         ICU PCT Algorithm               Non ICU PCT Algorithm    ----------------------------     ------------------------------         PCT < 0.25 ng/mL                 PCT < 0.1 ng/mL     Stopping of antibiotics            Stopping of antibiotics       strongly encouraged.               strongly encouraged.    ----------------------------     ------------------------------       PCT level decrease by               PCT < 0.25 ng/mL       >= 80% from peak PCT       OR PCT 0.25 - 0.5 ng/mL          Stopping of antibiotics                                             encouraged.     Stopping of antibiotics           encouraged.    ----------------------------     ------------------------------       PCT level decrease  by              PCT >= 0.25 ng/mL       < 80% from peak PCT        AND PCT >= 0.5 ng/mL             Continuing antibiotics  encouraged.       Continuing antibiotics            encouraged.    ----------------------------     ------------------------------     PCT level increase compared          PCT > 0.5 ng/mL         with peak PCT AND          PCT >= 0.5 ng/mL             Escalation of antibiotics                                          strongly encouraged.      Escalation of antibiotics        strongly encouraged.   Heparin level (unfractionated)     Status: Abnormal   Collection Time: 11/17/15  5:00 AM  Result Value Ref Range   Heparin Unfractionated 1.12 (H) 0.30 - 0.70 IU/mL    Comment: RESULTS CONFIRMED BY MANUAL DILUTION REPEATED TO VERIFY        IF HEPARIN RESULTS ARE BELOW EXPECTED VALUES, AND PATIENT DOSAGE HAS BEEN CONFIRMED, SUGGEST FOLLOW UP TESTING OF ANTITHROMBIN III LEVELS.   APTT     Status: Abnormal   Collection Time: 11/17/15  5:00 AM  Result Value Ref Range   aPTT 38 (H) 24 - 37 seconds    Comment:        IF BASELINE aPTT IS ELEVATED, SUGGEST PATIENT RISK ASSESSMENT BE USED TO DETERMINE APPROPRIATE ANTICOAGULANT THERAPY.   Glucose, capillary     Status: Abnormal   Collection Time: 11/17/15  8:12 AM  Result Value Ref Range   Glucose-Capillary 145 (H) 65 - 99 mg/dL  Glucose, capillary     Status: None   Collection Time: 11/17/15 11:04 AM  Result Value Ref Range   Glucose-Capillary 99 65 - 99 mg/dL   Dg Chest Port 1 View  11/17/2015  CLINICAL DATA:  Respiratory failure, sepsis, CHF. EXAM: PORTABLE CHEST 1 VIEW COMPARISON:  Portable chest x-ray dated November 14, 2015 FINDINGS: The lungs are adequately inflated. The interstitial markings are mildly increased. The pulmonary vascularity is prominent. The cardial pericardial silhouette is enlarged. There is partial obscuration of the left hemidiaphragm. The right  internal jugular venous catheter tip projects over the midportion of the SVC. IMPRESSION: Allowing for differences in radiographic technique there has been mild interval deterioration in the pulmonary interstitium suggesting increasing interstitial edema. Stable cardiomegaly. Slight increased atelectasis or infiltrate at the left lung base. Electronically Signed   By: David  Martinique M.D.   On: 11/17/2015 07:23    PMH:   Past Medical History  Diagnosis Date  . DM (diabetes mellitus) ([place])   . PAF (paroxysmal atrial fibrillation) ([place])     a. s/p dccv 04/2014;  b. amio/eliquis;  c. 06/2013 bb/ccb d/c 2/2 symptomatic bradycardia; d. 04/2015 recurrent AF->amio increased/bb resumed.  . Dyslipidemia   . CAD (coronary artery disease)     a. 2000 s/p PCI RCA;  b. 2005 PCI of RCA 2/2 ISR; c. 04/2013 Neg MV;  d. 05/2013 Cath: LM 20, LAD 30p, LCX 20p, OM1 90 small, RCA 29m(PTCA - FFR 0.86), PDA 40.  . Osteoarthritis   . Hypothyroidism     hx  . Rheumatoid arthritis(714.0)   . Obesity   . GERD (gastroesophageal reflux disease)   .  Shoulder fracture, left     a. Dr. Mack Guise  . Cerebral infarction ([place])   . CKD (chronic kidney disease), stage III   . Asthma   . Chronic diastolic CHF (congestive heart failure) ([place])     a. 10/2013 Echo: EF 60-65%, no rwma, Gr1 DD, mild MR, mildly dil LA.  Marland Kitchen Hypertensive heart disease   . Right rib fracture     a. 04/2015.    PSH:   Past Surgical History  Procedure Laterality Date  . Rca stent placement  2000  . Restenosis with ptca placement  2005  . Cataract surgery    . Partial hysterectomy    . Replacement total knee  2013    right  . Shoulder surgery  02/04/13  . Cardiac catheterization  06/11/2013  . Coronary angioplasty    . Electrophysiologic study N/A 07/25/2015    Procedure: CARDIOVERSION;  Surgeon: Minna Merritts, MD;  Location: [facility] ORS;  Service: Cardiovascular;  Laterality: N/A;    Allergies:  Allergies  Allergen Reactions  . Oxycodone  Nausea And Vomiting    Medications:   Prior to Admission medications   Medication Sig Start Date End Date Taking? Authorizing Provider  albuterol (PROVENTIL HFA;VENTOLIN HFA) 108 (90 Base) MCG/ACT inhaler Inhale 2 puffs into the lungs every 6 (six) hours as needed for wheezing or shortness of breath. 07/27/15   Loletha Grayer, MD  ALPRAZolam Duanne Moron) 0.25 MG tablet Take 1 tablet (0.25 mg total) by mouth 2 (two) times daily as needed for anxiety. 10/10/15   Jackolyn Confer, MD  amiodarone (PACERONE) 200 MG tablet Take 1 tablet (200 mg total) by mouth daily. 08/15/15   Areta Haber Dunn, PA-C  amLODipine (NORVASC) 10 MG tablet Take 1 tablet (10 mg total) by mouth daily. 10/05/15   Jackolyn Confer, MD  apixaban (ELIQUIS) 5 MG TABS tablet Take 1 tablet (5 mg total) by mouth 2 (two) times daily. 10/20/15   Jackolyn Confer, MD  atorvastatin (LIPITOR) 20 MG tablet Take 1 tablet (20 mg total) by mouth daily. 08/31/15   Jackolyn Confer, MD  ferrous sulfate 325 (65 FE) MG tablet Take 325 mg by mouth daily with breakfast.    Historical Provider, MD  fluconazole (DIFLUCAN) 150 MG tablet Take 1 tablet (150 mg total) by mouth once. 10/11/15   Jackolyn Confer, MD  fluticasone (FLONASE) 50 MCG/ACT nasal spray Place 2 sprays into both nostrils daily as needed for rhinitis.    Historical Provider, MD  gabapentin (NEURONTIN) 300 MG capsule Take 1 capsule (300 mg total) by mouth 3 (three) times daily. 10/10/15   Jackolyn Confer, MD  insulin aspart (NOVOLOG) 100 UNIT/ML injection Inject 2-10 Units into the skin 3 (three) times daily with meals as needed for high blood sugar. Pt uses per sliding scale:  140-199:  2 units  200-250:  4 units  251-299:  6 units  300-349:  8 units  Greater than 350: 10 units 08/02/15   Jackolyn Confer, MD  insulin glargine (LANTUS) 100 UNIT/ML injection Inject 0.25 mLs (25 Units total) into the skin daily. 10/20/15   Jackolyn Confer, MD  latanoprost (XALATAN) 0.005 % ophthalmic solution  Place 1 drop into both eyes at bedtime.     Historical Provider, MD  levothyroxine (SYNTHROID, LEVOTHROID) 150 MCG tablet Take 1 tablet (150 mcg total) by mouth daily before breakfast. 08/31/15   Jackolyn Confer, MD  lisinopril (PRINIVIL,ZESTRIL) 10 MG tablet Take 1 tablet (10 mg  total) by mouth daily. 07/12/15   Jackolyn Confer, MD  metFORMIN (GLUCOPHAGE) 500 MG tablet Take 1 tablet (500 mg total) by mouth 2 (two) times daily with a meal. 10/20/15   Jackolyn Confer, MD  nitroGLYCERIN (NITROSTAT) 0.4 MG SL tablet Place 0.4 mg under the tongue every 5 (five) minutes as needed for chest pain.    Historical Provider, MD  omeprazole (PRILOSEC) 20 MG capsule Take 1 capsule (20 mg total) by mouth 2 (two) times daily. 10/20/15 02/22/17  Jackolyn Confer, MD  ondansetron (ZOFRAN-ODT) 4 MG disintegrating tablet Take 1 tablet (4 mg total) by mouth every 8 (eight) hours as needed for nausea or vomiting. 07/12/15   Jackolyn Confer, MD  RELION INSULIN SYR 0.5ML/31G 31G X 5/16" 0.5 ML MISC  10/17/15   Historical Provider, MD  sertraline (ZOLOFT) 50 MG tablet Take 1 tablet (50 mg total) by mouth daily. 05/27/14   Jackolyn Confer, MD  torsemide (DEMADEX) 20 MG tablet Take 1 tablet (20 mg total) by mouth 2 (two) times daily. 10/05/15   Jackolyn Confer, MD  traMADol (ULTRAM) 50 MG tablet Take 2 tablets (100 mg total) by mouth every 8 (eight) hours as needed for moderate pain. 10/20/15   Jackolyn Confer, MD    Discontinued Meds:   Medications Discontinued During This Encounter  Medication Reason  . DOPamine (INTROPIN) 800 mg in dextrose 5 % 250 mL (3.2 mg/mL) infusion   . heparin injection 5,000 Units Change in therapy  . DOPamine (INTROPIN) 800 mg in dextrose 5 % 250 mL (3.2 mg/mL) infusion   . pantoprazole (PROTONIX) EC tablet 40 mg     Social History:  reports that she has never smoked. She has never used smokeless tobacco. She reports that she does not drink alcohol or use illicit drugs.  Family History:    Family History  Problem Relation Age of Onset  . Esophageal cancer Mother   . Diabetes Mother   . Cancer Mother     esophageal     Blood pressure 112/49, pulse 127, temperature 98.4 F (36.9 C), temperature source Oral, resp. rate 14, height '5\' 2"'  (1.575 m), weight 111.5 kg (245 lb 13 oz), SpO2 100 %. General appearance: appears stated age, mild distress and slowed mentation Eyes: negative Neck: no adenopathy, no carotid bruit, supple, symmetrical, trachea midline and thyroid not enlarged, symmetric, no tenderness/mass/nodules Back: symmetric, no curvature. ROM normal. No CVA tenderness. Resp: poor air movement Chest wall: no tenderness Cardio: regular rate and rhythm, S1, S2 normal, no murmur, click, rub or gallop and w/ rate in the low 60's GI: soft, non-tender; bowel sounds normal; no masses,  no organomegaly Extremities: edema tr-1+ Pulses: 2+ and symmetric Skin: Skin color, texture, turgor normal. No rashes or lesions Lymph nodes: Cervical, supraclavicular, and axillary nodes normal. Neurologic: Mental status: alertness: confused but pleasant       Dwana Melena, MD 11/17/2015, 12:05 PM

## 2015-11-17 NOTE — Consult Note (Signed)
ORTHOPAEDIC CONSULTATION  REQUESTING PHYSICIAN: Raylene Miyamoto, MD  Chief Complaint: Right hip infection  HPI: [name] [name] is a 80 y.o. female who underwent a right hip hemiarthroplasty for fact fracture on 10/27/2015 at [facility]. She was readmitted with an acute infection and on 6/27 underwent IND explantation and antibiotic cement spacer placement. She subsequently decline medically and was transferred to critical care at [facility].  Past Medical History  Diagnosis Date  . DM (diabetes mellitus) ([place])   . PAF (paroxysmal atrial fibrillation) ([place])     a. s/p dccv 04/2014;  b. amio/eliquis;  c. 06/2013 bb/ccb d/c 2/2 symptomatic bradycardia; d. 04/2015 recurrent AF->amio increased/bb resumed.  . Dyslipidemia   . CAD (coronary artery disease)     a. 2000 s/p PCI RCA;  b. 2005 PCI of RCA 2/2 ISR; c. 04/2013 Neg MV;  d. 05/2013 Cath: LM 20, LAD 30p, LCX 20p, OM1 90 small, RCA 80m(PTCA - FFR 0.86), PDA 40.  . Osteoarthritis   . Hypothyroidism     hx  . Rheumatoid arthritis(714.0)   . Obesity   . GERD (gastroesophageal reflux disease)   . Shoulder fracture, left     a. Dr. KMack Guise . Cerebral infarction ([place]   . CKD (chronic kidney disease), stage III   . Asthma   . Chronic diastolic CHF (congestive heart failure) ([place]     a. 10/2013 Echo: EF 60-65%, no rwma, Gr1 DD, mild MR, mildly dil LA.  .Marland KitchenHypertensive heart disease   . Right rib fracture     a. 04/2015.   Past Surgical History  Procedure Laterality Date  . Rca stent placement  2000  . Restenosis with ptca placement  2005  . Cataract surgery    . Partial hysterectomy    . Replacement total knee  2013    right  . Shoulder surgery  02/04/13  . Cardiac catheterization  06/11/2013  . Coronary angioplasty    . Electrophysiologic study N/A 07/25/2015    Procedure: CARDIOVERSION;  Surgeon: TMinna Merritts MD;  Location: [facility] ORS;  Service: Cardiovascular;  Laterality: N/A;   Social History   Social History   . Marital Status: Married    Spouse Name: N/A  . Number of Children: 3  . Years of Education: N/A   Occupational History  . retired    Social History Main Topics  . Smoking status: Never Smoker   . Smokeless tobacco: Never Used     Comment: former passive smoker  . Alcohol Use: No  . Drug Use: No  . Sexual Activity: No   Other Topics Concern  . Not on file   Social History Narrative   Wid[place]; [place] Three children. 4 grandchildren; 4 great grandchildren   Regular exercise: yes   Caffeine use: tea occasionally   Family History  Problem Relation Age of Onset  . Esophageal cancer Mother   . Diabetes Mother   . Cancer Mother     esophageal    Allergies  Allergen Reactions  . Oxycodone Nausea And Vomiting   Prior to Admission medications   Medication Sig Start Date End Date Taking? Authorizing Provider  albuterol (PROVENTIL HFA;VENTOLIN HFA) 108 (90 Base) MCG/ACT inhaler Inhale 2 puffs into the lungs every 6 (six) hours as needed for wheezing or shortness of breath. 07/27/15   RLoletha Grayer MD  ALPRAZolam (Duanne Moron 0.25 MG tablet Take 1 tablet (0.25 mg total) by mouth 2 (two) times daily as needed  for anxiety. 10/10/15   Jackolyn Confer, MD  amiodarone (PACERONE) 200 MG tablet Take 1 tablet (200 mg total) by mouth daily. 08/15/15   Areta Haber Dunn, PA-C  amLODipine (NORVASC) 10 MG tablet Take 1 tablet (10 mg total) by mouth daily. 10/05/15   Jackolyn Confer, MD  apixaban (ELIQUIS) 5 MG TABS tablet Take 1 tablet (5 mg total) by mouth 2 (two) times daily. 10/20/15   Jackolyn Confer, MD  atorvastatin (LIPITOR) 20 MG tablet Take 1 tablet (20 mg total) by mouth daily. 08/31/15   Jackolyn Confer, MD  ferrous sulfate 325 (65 FE) MG tablet Take 325 mg by mouth daily with breakfast.    Historical Provider, MD  fluconazole (DIFLUCAN) 150 MG tablet Take 1 tablet (150 mg total) by mouth once. 10/11/15   Jackolyn Confer, MD  fluticasone (FLONASE) 50 MCG/ACT nasal spray Place 2  sprays into both nostrils daily as needed for rhinitis.    Historical Provider, MD  gabapentin (NEURONTIN) 300 MG capsule Take 1 capsule (300 mg total) by mouth 3 (three) times daily. 10/10/15   Jackolyn Confer, MD  insulin aspart (NOVOLOG) 100 UNIT/ML injection Inject 2-10 Units into the skin 3 (three) times daily with meals as needed for high blood sugar. Pt uses per sliding scale:  140-199:  2 units  200-250:  4 units  251-299:  6 units  300-349:  8 units  Greater than 350: 10 units 08/02/15   Jackolyn Confer, MD  insulin glargine (LANTUS) 100 UNIT/ML injection Inject 0.25 mLs (25 Units total) into the skin daily. 10/20/15   Jackolyn Confer, MD  latanoprost (XALATAN) 0.005 % ophthalmic solution Place 1 drop into both eyes at bedtime.     Historical Provider, MD  levothyroxine (SYNTHROID, LEVOTHROID) 150 MCG tablet Take 1 tablet (150 mcg total) by mouth daily before breakfast. 08/31/15   Jackolyn Confer, MD  lisinopril (PRINIVIL,ZESTRIL) 10 MG tablet Take 1 tablet (10 mg total) by mouth daily. 07/12/15   Jackolyn Confer, MD  metFORMIN (GLUCOPHAGE) 500 MG tablet Take 1 tablet (500 mg total) by mouth 2 (two) times daily with a meal. 10/20/15   Jackolyn Confer, MD  nitroGLYCERIN (NITROSTAT) 0.4 MG SL tablet Place 0.4 mg under the tongue every 5 (five) minutes as needed for chest pain.    Historical Provider, MD  omeprazole (PRILOSEC) 20 MG capsule Take 1 capsule (20 mg total) by mouth 2 (two) times daily. 10/20/15 02/22/17  Jackolyn Confer, MD  ondansetron (ZOFRAN-ODT) 4 MG disintegrating tablet Take 1 tablet (4 mg total) by mouth every 8 (eight) hours as needed for nausea or vomiting. 07/12/15   Jackolyn Confer, MD  RELION INSULIN SYR 0.5ML/31G 31G X 5/16" 0.5 ML MISC  10/17/15   Historical Provider, MD  sertraline (ZOLOFT) 50 MG tablet Take 1 tablet (50 mg total) by mouth daily. 05/27/14   Jackolyn Confer, MD  torsemide (DEMADEX) 20 MG tablet Take 1 tablet (20 mg total) by mouth 2 (two) times  daily. 10/05/15   Jackolyn Confer, MD  traMADol (ULTRAM) 50 MG tablet Take 2 tablets (100 mg total) by mouth every 8 (eight) hours as needed for moderate pain. 10/20/15   Jackolyn Confer, MD   Dg Chest Port 1 View  11/17/2015  CLINICAL DATA:  Respiratory failure, sepsis, CHF. EXAM: PORTABLE CHEST 1 VIEW COMPARISON:  Portable chest x-ray dated November 14, 2015 FINDINGS: The lungs are adequately inflated. The interstitial markings  are mildly increased. The pulmonary vascularity is prominent. The cardial pericardial silhouette is enlarged. There is partial obscuration of the left hemidiaphragm. The right internal jugular venous catheter tip projects over the midportion of the SVC. IMPRESSION: Allowing for differences in radiographic technique there has been mild interval deterioration in the pulmonary interstitium suggesting increasing interstitial edema. Stable cardiomegaly. Slight increased atelectasis or infiltrate at the left lung base. Electronically Signed   By: David  Martinique M.D.   On: 11/17/2015 07:23    Positive ROS: All other systems have been reviewed and were otherwise negative with the exception of those mentioned in the HPI and as above.  Labs cbc  Recent Labs  11/16/15 2325 11/17/15 0500  WBC 16.7* 23.4*  HGB 8.9* 8.4*  HCT 27.2* 25.7*  PLT 344 457*    Labs inflam No results for input(s): CRP in the last 72 hours.  Invalid input(s): ESR  Labs coag  Recent Labs  11/16/15 2325  INR 1.36     Recent Labs  11/16/15 2325 11/17/15 0500  NA 127* 126*  K 5.3* 5.3*  CL 103 97*  CO2 17* 19*  GLUCOSE 277* 206*  BUN 45* 44*  CREATININE 4.51* 4.54*  CALCIUM 6.8* 6.9*    Physical Exam: Filed Vitals:   11/17/15 0900 11/17/15 1000  BP: 114/48 114/41  Pulse: 53 58  Temp:    Resp: 10 11   General: Alert, no acute distress Cardiovascular: No pedal edema Respiratory: No cyanosis, no use of accessory musculature GI: No organomegaly, abdomen is soft Skin: No lesions in  the area of chief complaint other than those listed below in MSK exam.  Neurologic: unable to participate Psychiatric: Patient is unable to participate Lymphatic: No axillary or cervical lymphadenopathy  MUSCULOSKELETAL:  RLE: Compartments are soft foot is warm and well-perfused, suction dressing intact and drain in place Other extremities are atraumatic with painless ROM and NVI.  Assessment: Infected right hip arthroplasty status post antibiotic cement spacer.  Plan: Critical care per primary No indication for urgent surgical intervention  Continued wound care, remove HV drains and start dressing changes with wound care team. If incisional vac then would do weekly changes, if open then would do QOD changes.   Follow-up with orthopedic surgeon at [facility] to discuss possible replantation once clears infection   Renette Butters, MD Cell [phone]   11/17/2015 10:40 AM

## 2015-11-18 ENCOUNTER — Inpatient Hospital Stay (HOSPITAL_COMMUNITY): Payer: Medicare Other

## 2015-11-18 ENCOUNTER — Other Ambulatory Visit (HOSPITAL_COMMUNITY): Payer: Medicare Other

## 2015-11-18 DIAGNOSIS — N179 Acute kidney failure, unspecified: Secondary | ICD-10-CM | POA: Diagnosis present

## 2015-11-18 LAB — RENAL FUNCTION PANEL
ANION GAP: 8 (ref 5–15)
ANION GAP: 7 (ref 5–15)
Albumin: 1.6 g/dL — ABNORMAL LOW (ref 3.5–5.0)
Albumin: 1.5 g/dL — ABNORMAL LOW (ref 3.5–5.0)
BUN: 37 mg/dL — AB (ref 6–20)
BUN: 22 mg/dL — ABNORMAL HIGH (ref 6–20)
CALCIUM: 6.7 mg/dL — AB (ref 8.9–10.3)
CHLORIDE: 99 mmol/L — AB (ref 101–111)
CO2: 23 mmol/L (ref 22–32)
CO2: 24 mmol/L (ref 22–32)
Calcium: 6.6 mg/dL — ABNORMAL LOW (ref 8.9–10.3)
Chloride: 103 mmol/L (ref 101–111)
Creatinine, Ser: 3.55 mg/dL — ABNORMAL HIGH (ref 0.44–1.00)
Creatinine, Ser: 2.05 mg/dL — ABNORMAL HIGH (ref 0.44–1.00)
GFR calc Af Amer: 13 mL/min — ABNORMAL LOW (ref >60)
GFR calc Af Amer: 26 mL/min — ABNORMAL LOW (ref >60)
GFR calc non Af Amer: 11 mL/min — ABNORMAL LOW (ref >60)
GFR calc non Af Amer: 22 mL/min — ABNORMAL LOW (ref >60)
GLUCOSE: 125 mg/dL — AB (ref 65–99)
GLUCOSE: 69 mg/dL (ref 65–99)
PHOSPHORUS: 4.7 mg/dL — AB (ref 2.5–4.6)
POTASSIUM: 4.1 mmol/L (ref 3.5–5.1)
Phosphorus: 3.4 mg/dL (ref 2.5–4.6)
Potassium: 3.6 mmol/L (ref 3.5–5.1)
SODIUM: 134 mmol/L — AB (ref 135–145)
Sodium: 130 mmol/L — ABNORMAL LOW (ref 135–145)

## 2015-11-18 LAB — MAGNESIUM: Magnesium: 1.3 mg/dL — ABNORMAL LOW (ref 1.7–2.4)

## 2015-11-18 LAB — CBC WITH DIFFERENTIAL/PLATELET
BASOS PCT: 0 %
Basophils Absolute: 0 10*3/uL (ref 0.0–0.1)
Eosinophils Absolute: 0.1 10*3/uL (ref 0.0–0.7)
Eosinophils Relative: 1 %
HEMATOCRIT: 22.4 % — AB (ref 36.0–46.0)
HEMOGLOBIN: 7.3 g/dL — AB (ref 12.0–15.0)
LYMPHS ABS: 2.1 10*3/uL (ref 0.7–4.0)
LYMPHS PCT: 12 %
MCH: 28.4 pg (ref 26.0–34.0)
MCHC: 32.6 g/dL (ref 30.0–36.0)
MCV: 87.2 fL (ref 78.0–100.0)
MONO ABS: 1.1 10*3/uL — AB (ref 0.1–1.0)
MONOS PCT: 6 %
NEUTROS ABS: 14.1 10*3/uL — AB (ref 1.7–7.7)
NEUTROS PCT: 81 %
Platelets: 413 10*3/uL — ABNORMAL HIGH (ref 150–400)
RBC: 2.57 MIL/uL — ABNORMAL LOW (ref 3.87–5.11)
RDW: 16.1 % — ABNORMAL HIGH (ref 11.5–15.5)
WBC: 17.4 10*3/uL — ABNORMAL HIGH (ref 4.0–10.5)

## 2015-11-18 LAB — PROCALCITONIN: Procalcitonin: 0.48 ng/mL

## 2015-11-18 LAB — HEPARIN LEVEL (UNFRACTIONATED): Heparin Unfractionated: 0.8 IU/mL — ABNORMAL HIGH (ref 0.30–0.70)

## 2015-11-18 LAB — GLUCOSE, CAPILLARY
GLUCOSE-CAPILLARY: 135 mg/dL — AB (ref 65–99)
Glucose-Capillary: 111 mg/dL — ABNORMAL HIGH (ref 65–99)
Glucose-Capillary: 134 mg/dL — ABNORMAL HIGH (ref 65–99)
Glucose-Capillary: 72 mg/dL (ref 65–99)
Glucose-Capillary: 111 mg/dL — ABNORMAL HIGH (ref 65–99)
Glucose-Capillary: 111 mg/dL — ABNORMAL HIGH (ref 65–99)

## 2015-11-18 LAB — APTT
APTT: 129 s — AB (ref 24–37)
aPTT: 156 seconds — ABNORMAL HIGH (ref 24–37)

## 2015-11-18 MED ORDER — PRISMASOL BGK 0/2.5 32-2.5 MEQ/L IV SOLN
INTRAVENOUS | Status: DC
  Administered 2015-11-18: 02:00:00 via INTRAVENOUS_CENTRAL
  Filled 2015-11-18 (×9): qty 5000

## 2015-11-18 MED ORDER — PRISMASOL BGK 4/2.5 32-4-2.5 MEQ/L IV SOLN
INTRAVENOUS | Status: DC
  Administered 2015-11-18 – 2015-11-19 (×5): via INTRAVENOUS_CENTRAL
  Filled 2015-11-18 (×12): qty 5000

## 2015-11-18 MED ORDER — HEPARIN (PORCINE) IN NACL 100-0.45 UNIT/ML-% IJ SOLN
800.0000 [IU]/h | INTRAMUSCULAR | Status: DC
  Filled 2015-11-18: qty 250

## 2015-11-18 MED ORDER — PRISMASOL BGK 4/2.5 32-4-2.5 MEQ/L IV SOLN
INTRAVENOUS | Status: DC
  Administered 2015-11-18 (×2): via INTRAVENOUS_CENTRAL
  Filled 2015-11-18 (×4): qty 5000

## 2015-11-18 MED ORDER — PRISMASOL BGK 4/2.5 32-4-2.5 MEQ/L IV SOLN
INTRAVENOUS | Status: DC
  Administered 2015-11-18 – 2015-11-19 (×2): via INTRAVENOUS_CENTRAL
  Filled 2015-11-18 (×3): qty 5000

## 2015-11-18 MED ORDER — MAGNESIUM SULFATE 2 GM/50ML IV SOLN
2.0000 g | Freq: Once | INTRAVENOUS | Status: AC
  Administered 2015-11-18: 2 g via INTRAVENOUS
  Filled 2015-11-18: qty 50

## 2015-11-18 MED ORDER — LEVOTHYROXINE SODIUM 75 MCG PO TABS
150.0000 ug | ORAL_TABLET | Freq: Every day | ORAL | Status: DC
  Administered 2015-11-19 – 2015-11-27 (×10): 150 ug via ORAL
  Filled 2015-11-18 (×4): qty 2
  Filled 2015-11-18: qty 1
  Filled 2015-11-18 (×2): qty 2
  Filled 2015-11-18: qty 1
  Filled 2015-11-18 (×3): qty 2

## 2015-11-18 MED ORDER — WHITE PETROLATUM GEL
Status: AC
  Administered 2015-11-18: 12:00:00
  Filled 2015-11-18: qty 1

## 2015-11-18 MED ORDER — PANTOPRAZOLE SODIUM 40 MG PO TBEC
40.0000 mg | DELAYED_RELEASE_TABLET | Freq: Every day | ORAL | Status: DC
Start: 2015-11-18 — End: 2015-11-27
  Administered 2015-11-18 – 2015-11-27 (×10): 40 mg via ORAL
  Filled 2015-11-18 (×10): qty 1

## 2015-11-18 MED ORDER — INSULIN ASPART 100 UNIT/ML ~~LOC~~ SOLN
0.0000 [IU] | Freq: Three times a day (TID) | SUBCUTANEOUS | Status: DC
  Administered 2015-11-18: 3 [IU] via SUBCUTANEOUS
  Administered 2015-11-19: 1 [IU] via SUBCUTANEOUS
  Administered 2015-11-19: 2 [IU] via SUBCUTANEOUS
  Administered 2015-11-19: 1 [IU] via SUBCUTANEOUS
  Administered 2015-11-20: 2 [IU] via SUBCUTANEOUS
  Administered 2015-11-20: 1 [IU] via SUBCUTANEOUS
  Administered 2015-11-20 – 2015-11-23 (×8): 2 [IU] via SUBCUTANEOUS
  Administered 2015-11-23: 7 [IU] via SUBCUTANEOUS
  Administered 2015-11-23: 3 [IU] via SUBCUTANEOUS
  Administered 2015-11-24: 5 [IU] via SUBCUTANEOUS
  Administered 2015-11-24 – 2015-11-25 (×4): 2 [IU] via SUBCUTANEOUS
  Administered 2015-11-25: 1 [IU] via SUBCUTANEOUS
  Administered 2015-11-26: 2 [IU] via SUBCUTANEOUS
  Administered 2015-11-26: 1 [IU] via SUBCUTANEOUS
  Administered 2015-11-27: 2 [IU] via SUBCUTANEOUS
  Administered 2015-11-27: 7 [IU] via SUBCUTANEOUS

## 2015-11-18 MED ORDER — PRISMASOL BGK 0/2.5 32-2.5 MEQ/L IV SOLN
INTRAVENOUS | Status: DC
  Administered 2015-11-18: 02:00:00 via INTRAVENOUS_CENTRAL
  Filled 2015-11-18 (×4): qty 5000

## 2015-11-18 MED ORDER — MEROPENEM 1 G IV SOLR
1.0000 g | Freq: Three times a day (TID) | INTRAVENOUS | Status: DC
  Administered 2015-11-18 – 2015-11-19 (×3): 1 g via INTRAVENOUS
  Filled 2015-11-18 (×5): qty 1

## 2015-11-18 MED ORDER — PIPERACILLIN-TAZOBACTAM 3.375 G IVPB 30 MIN
3.3750 g | Freq: Four times a day (QID) | INTRAVENOUS | Status: DC
  Administered 2015-11-18 (×3): 3.375 g via INTRAVENOUS
  Filled 2015-11-18 (×4): qty 50

## 2015-11-18 MED ORDER — PRISMASOL BGK 0/2.5 32-2.5 MEQ/L IV SOLN
INTRAVENOUS | Status: DC
Start: 2015-11-18 — End: 2015-11-18
  Administered 2015-11-18: 02:00:00 via INTRAVENOUS_CENTRAL
  Filled 2015-11-18 (×3): qty 5000

## 2015-11-18 MED ORDER — HEPARIN (PORCINE) 2000 UNITS/L FOR CRRT
INTRAVENOUS_CENTRAL | Status: DC | PRN
  Filled 2015-11-18: qty 1000

## 2015-11-18 NOTE — Progress Notes (Addendum)
[facility] Progress Note  81 y.o. female with a history of DM, PAF, CASHD, CHF, Hypothyroidism and w/ a baseline creatinine of 1.1-1.3 who recently had a right hip hemiarthroplasty ~10/27/15 complicated by an infection of the surgical site. On 6/27 she had an I&D of the right hip with implantation fo spacer and wound vac + initiation of broad spectrum Abx (Vanc + Zosyn). She also had bradycardia with rates in the 20's thought by cardiology to be secondary to amiodarone and metoprolol --> initiation of Isoproterenol. Transfer to [facility] on 6/29 for sepsis. She was started on Dopamine and UOP was noted to be very low with a Cr of 4.54, mild hyperkalemia and a nongap metabolic acidosis. Poor response to IVF and bicarb; therefore, initiated on CRRT late on 6/30.   Assessment/ Plan:   1. Acute Kidney Injury on CKD III with a baseline Cr ~1.1-1.3. She has reasons to be in ATN with sepsis + likely Hypotension during periods of severe bradycardia. I am not sure if she received contrast at [facility]. She is already Stage III AKI by KDIGO based on a 3x increase from BL Cr. - I reviewed the urine microscopy personally and do not see pigmented casts suggestive of ATN but by history she has every reason to be in ATN. There are NUMEROUS WBC's in the urine but no WBC casts, only a few RBC's which are normomorphic, + yeast. WBC's may be from the chronic indwelling catheter; AIN is lower on the differential. - Ultrasound --> no hydro as expected w/ foley in place; nl sized kidneys. - Hyperkalemic resolved on CRRT. - D#2 on CRRT (initiated late on 6/30). - Stop sterile water +3 amps HCO3 - Increase UF to 191ml/hr net as tolerated. - Change bath to 4K 2. Hyponatremia - Hyponatremia which is likely secondary to the Acute Kidney Injury. Will resolve on CRRT.  3. Sepsis - most likely due to the right hip with recent I&D on 11/14/15 --> on broad spectrum Abxs. 4. Bradycardia - appears to have resolved  holding the offending medications. 5. Right hip arthroplasty and I&D w/ revision. 6. Hypothyroidism 7. DM  Subjective:   Multiple complaints including dyspnea (not improved), fever, chills. I'm not sure how reliable this ROS is. She is certainly disoriented.  Seen on CRRT @845am  - Qb239ml/min - Pre/Post 500/300 ml/hr - Qd 1552ml/hr -2k bath - UF 57ml/hr net neg     Objective:   BP 114/85 mmHg  Pulse 56  Temp(Src) 94.1 F (34.5 C) (Axillary)  Resp 10  Ht 5\' 2"  (1.575 m)  Wt 112.3 kg (247 lb 9.2 oz)  BMI 45.27 kg/m2  SpO2 100%  Intake/Output Summary (Last 24 hours) at 11/18/15 0846 Last data filed at 11/18/15 0700  Gross per 24 hour  Intake 3776.64 ml  Output   1902 ml  Net 1874.64 ml   Weight change: 18 kg (39 lb 10.9 oz)  Physical Exam: General appearance: appears stated age, mild distress and slowed mentation, but pleasant Resp: poor air movement w/ no rales noted Chest wall: no tenderness Cardio: regular rate and rhythm, S1, S2 normal, no murmur, click, rub or gallop and w/ rate in the low 60's GI: soft, non-tender; bowel sounds decr freq Extremities: edema tr-1+ Pulses: 2+ and symmetric Neurologic: Mental status: alertness: confused   Imaging: 01/19/16 Renal  11/17/2015  CLINICAL DATA:  Acute renal failure EXAM: RENAL / URINARY TRACT ULTRASOUND COMPLETE COMPARISON:  11/15/2015 FINDINGS: Right Kidney: Length: 11.5 cm. Echogenicity within normal limits.  No mass or hydronephrosis visualized. Left Kidney: Length: 10.7 cm. Echogenicity within normal limits. No mass or hydronephrosis visualized. Bladder: Decompressed with Foley catheter in place IMPRESSION: No acute findings.  No hydronephrosis. Electronically Signed   By: Charlett Nose M.D.   On: 11/17/2015 12:56   Dg Chest Port 1 View  11/18/2015  CLINICAL DATA:  81 year old female with renal failure EXAM: PORTABLE CHEST 1 VIEW COMPARISON:  Chest radiograph dated 11/17/2015 FINDINGS: Right IJ central line with tip who in  stable positioning. Left IJ dialysis catheter terminates at cavoatrial junction. The patient is rotated to the left. There is stable cardiomegaly with central vascular and interstitial prominence compatible with congestive changes. Left lung base opacity may represent atelectatic changes. Pneumonia is not excluded. Clinical correlation is recommended. Small left pleural effusion may be present. There is osteopenia with degenerative changes of the spine. No acute fracture. IMPRESSION: Cardiomegaly with congestive changes, new from prior study. Left lung base atelectasis versus infiltrate. Electronically Signed   By: Elgie Collard M.D.   On: 11/18/2015 00:37   Dg Chest Port 1 View  11/17/2015  CLINICAL DATA:  Respiratory failure, sepsis, CHF. EXAM: PORTABLE CHEST 1 VIEW COMPARISON:  Portable chest x-ray dated November 14, 2015 FINDINGS: The lungs are adequately inflated. The interstitial markings are mildly increased. The pulmonary vascularity is prominent. The cardial pericardial silhouette is enlarged. There is partial obscuration of the left hemidiaphragm. The right internal jugular venous catheter tip projects over the midportion of the SVC. IMPRESSION: Allowing for differences in radiographic technique there has been mild interval deterioration in the pulmonary interstitium suggesting increasing interstitial edema. Stable cardiomegaly. Slight increased atelectasis or infiltrate at the left lung base. Electronically Signed   By: David  [place] M.D.   On: 11/17/2015 07:23    Labs: BMET  Recent Labs Lab 11/16/15 2325 11/17/15 0500 11/17/15 1620 11/18/15 0502  NA 127* 126* 128* 130*  K 5.3* 5.3* 5.4* 4.1  CL 103 97* 98* 99*  CO2 17* 19* 21* 23  GLUCOSE 277* 206* 97 125*  BUN 45* 44* 46* 37*  CREATININE 4.51* 4.54* 4.68* 3.55*  CALCIUM 6.8* 6.9* 6.8* 6.6*  PHOS 5.7*  --   --  4.7*   CBC  Recent Labs Lab 11/16/15 2325 11/17/15 0500 11/18/15 0501  WBC 16.7* 23.4* 17.4*  NEUTROABS 15.5*  --   14.1*  HGB 8.9* 8.4* 7.3*  HCT 27.2* 25.7* 22.4*  MCV 88.3 88.3 87.2  PLT 344 457* 413*    Medications:    . antiseptic oral rinse  7 mL Mouth Rinse q12n4p  . atorvastatin  20 mg Oral q1800  . chlorhexidine  15 mL Mouth Rinse BID  . insulin aspart  0-20 Units Subcutaneous Q4H  . levothyroxine  75 mcg Intravenous QAC breakfast  . piperacillin-tazobactam  3.375 g Intravenous Q6H      Paulene Floor, MD 11/18/2015, 8:46 AM

## 2015-11-18 NOTE — Progress Notes (Addendum)
See below for culture results taken at [facility]:  Deep Fluid Swab from OR  Species 1: Staph Aureus (MSSA) - light growth  TCN = S Bactrim = S Gent = S Clinda = S Vanc = S MIC 1 Oxacillin = S MIC < 0.25 Levoflox = I Linezolid = S  Species 2: Enterobacter Cloacae Complex - few  Ertapenem = S Bactrom = S Cipro = S Gent = S Tobra = S Cefazolin = R Ceftriaxone = S Oxacillin = R Imipenem = S Cefepime = S  Superficial Swab from OR  Species 1: Staph Aureus (MSSA) - light growth Species 2: Enterobacter Cloacae Complex - few  Same as above  Right Hip Capsule  Species 1: Staph Aureus (MSSA) - light growth Species 2: Enterobacter Cloacae Complex - few Species 3: Klebsiella PNA (susceptibilities pending) - few colonies  Faxed report in Shadow Chart. Will need to get susceptibilities from [facility] for [name] [name], PharmD, BCPS, [facility] Clinical Pharmacist Pager [phone] 11/18/2015 12:06 PM  ____________________________________________________________________________________________ Addendum:  Final Cx results from [facility] indicate the 3rd species was another morphology of Enterobacter NOT Klebsiella. Same susceptibilities as previous Enterobacter.   [facility] is faxing over final report and I will place in shadow chart  Isaac Bliss, PharmD, BCPS, [facility] Clinical Pharmacist Pager [phone] 11/19/2015 9:06 AM

## 2015-11-18 NOTE — Progress Notes (Signed)
eLink Physician-Brief Progress Note Patient Name: [name] [name] DOB: [birth year] MRN: [id number]   Date of Service  11/18/2015  HPI/Events of Note  Camera check on pt.  Off bipap.  Looks comfortable. About to have dinner. 130/45, 63, 20, 100% on   eICU Interventions  Cont to observe for now.  D/w RN        Daneen Schick Dios 11/18/2015, 6:47 PM

## 2015-11-18 NOTE — Progress Notes (Signed)
PULMONARY / CRITICAL CARE MEDICINE   Name: [name] [name] MRN: [id number] DOB: 09-08-1934    ADMISSION DATE:  11/16/2015 CONSULTATION DATE:  11/16/15  REFERRING MD:  [facility]  CHIEF COMPLAINT:  Sepsis  HISTORY OF PRESENT ILLNESS:  Pt is encephelopathic; therefore, this HPI is obtained from chart review. [name] [name] is a 81 y.o. female with PMH as outlined below including recent right hip hemiarthroplasty roughly 10/27/15 complicated by infected surgical site.  She was admitted again to [facility] on 6/27 and underwent I&D of the right hip with implantation of spacer and wound vac and was started on empiric vanc / zosyn.  In addition, she had severe bradycardia with HR in the 20's.  She was seen in consultation by cardiology who felt that bradycardia was related to amiodarone and metoprolol; therefore, these were discontinued and pt was started on Isuprel.  Her hospital stay was complicated by anemia for which she received 4u PRBC transfusion along with worsening renal failure. She was transferred to [facility] 6/29 for further evaluation and management.  SUBJECTIVE:  No acute events overnight.  Weaned off BIPAP overnight , no increased wob , o2 sats good on [place] .  Awake , answering questions this am.  Remains on vasopressor support with Dopamine. Some dry cough this am . On CRRT.    VITAL SIGNS: BP 114/85 mmHg  Pulse 56  Temp(Src) 94.1 F (34.5 C) (Axillary)  Resp 10  Ht 5\' 2"  (1.575 m)  Wt 112.3 kg (247 lb 9.2 oz)  BMI 45.27 kg/m2  SpO2 100%  HEMODYNAMICS: CVP:  [10 mmHg-13 mmHg] 13 mmHg  VENTILATOR SETTINGS: Vent Mode:  [-] BIPAP FiO2 (%):  [30 %] 30 % Set Rate:  [10 bmp] 10 bmp PEEP:  [4 cmH20] 4 cmH20  INTAKE / OUTPUT: I/O last 3 completed shifts: In: 4558.5 [I.V.:3858.5; IV Piggyback:700] Out: 1932 [Urine:695; Drains:200; Other:1037]   PHYSICAL EXAMINATION: General: Obese female. No distress.  Neuro: Awake. Follows commands. Moving all distal 4 extremities  equally. HEENT:  No scleral icterus or injection. Cardiovascular: Bradycardic with junctional rhythm on telemetry. Unable to appreciate JVD with body habitus. Mild anasarca. Lungs: Normal work of breathing  Distant breath sounds bilaterally. Abdomen: Soft. Protuberant. Nontender. Musculoskeletal: Wound VAC in place over right hip. No other joint deformity appreciated. Skin: Warm and dry. No rash on exposed skin.  LABS:  BMET  Recent Labs Lab 11/17/15 0500 11/17/15 1620 11/18/15 0502  NA 126* 128* 130*  K 5.3* 5.4* 4.1  CL 97* 98* 99*  CO2 19* 21* 23  BUN 44* 46* 37*  CREATININE 4.54* 4.68* 3.55*  GLUCOSE 206* 97 125*    Electrolytes  Recent Labs Lab 11/16/15 2325 11/17/15 0500 11/17/15 1620 11/18/15 0501 11/18/15 0502  CALCIUM 6.8* 6.9* 6.8*  --  6.6*  MG 1.1*  --   --  1.3*  --   PHOS 5.7*  --   --   --  4.7*    CBC  Recent Labs Lab 11/16/15 2325 11/17/15 0500 11/18/15 0501  WBC 16.7* 23.4* 17.4*  HGB 8.9* 8.4* 7.3*  HCT 27.2* 25.7* 22.4*  PLT 344 457* 413*    Coag's  Recent Labs Lab 11/16/15 2325 11/17/15 0500 11/17/15 1100 11/17/15 2035  APTT  --  38* 46* 71*  INR 1.36  --   --   --     Sepsis Markers  Recent Labs Lab 11/16/15 2325 11/17/15 0049 11/17/15 0500 11/18/15 0501  LATICACIDVEN 2.3*  --   --   --  PROCALCITON  --  0.60 0.64 0.48    ABG  Recent Labs Lab 11/16/15 2312 11/16/15 2356  PHART 7.268* 7.315*  PCO2ART 32.8* 31.3*  PO2ART 103.0* 119.0*    Liver Enzymes  Recent Labs Lab 11/16/15 2325 11/18/15 0502  AST 15  --   ALT 8*  --   ALKPHOS 108  --   BILITOT 0.5  --   ALBUMIN 1.5* 1.6*    Cardiac Enzymes  Recent Labs Lab 11/16/15 2325  TROPONINI 0.06*    Glucose  Recent Labs Lab 11/17/15 1104 11/17/15 1536 11/17/15 2016 11/17/15 2338 11/18/15 0345 11/18/15 0739  GLUCAP 99 77 112* 112* 111* 134*    Imaging US Renal  11/17/2015  CLINICAL DATA:  Acute renal failure EXAM: RENAL / URINARY  TRACT ULTRASOUND COMPLETE COMPARISON:  11/15/2015 FINDINGS: Right Kidney: Length: 11.5 cm. Echogenicity within normal limits. No mass or hydronephrosis visualized. Left Kidney: Length: 10.7 cm. Echogenicity within normal limits. No mass or hydronephrosis visualized. Bladder: Decompressed with Foley catheter in place IMPRESSION: No acute findings.  No hydronephrosis. Electronically Signed   By: Charlett Nose M.D.   On: 11/17/2015 12:56   Dg Chest Port 1 View  11/18/2015  CLINICAL DATA:  81 year old female with renal failure EXAM: PORTABLE CHEST 1 VIEW COMPARISON:  Chest radiograph dated 11/17/2015 FINDINGS: Right IJ central line with tip who in stable positioning. Left IJ dialysis catheter terminates at cavoatrial junction. The patient is rotated to the left. There is stable cardiomegaly with central vascular and interstitial prominence compatible with congestive changes. Left lung base opacity may represent atelectatic changes. Pneumonia is not excluded. Clinical correlation is recommended. Small left pleural effusion may be present. There is osteopenia with degenerative changes of the spine. No acute fracture. IMPRESSION: Cardiomegaly with congestive changes, new from prior study. Left lung base atelectasis versus infiltrate. Electronically Signed   By: Elgie Collard M.D.   On: 11/18/2015 00:37     STUDIES:  TTE 07/20/15:Atrial fibrillation. LVEF 55-60%. Normal wall motion. Unable to assess diastolic function. Mild mitral regurgitation. RV normal in size and function. Pulmonary artery systolic pressure 38 mmHg. EKG 6/29:  Junctional bradycardia. Port CXR 6/30:  Right hilar fullness with silhouetting of the left hemidiaphragm suggestive of retrocardiac opacity. Globular heart with suggestion of cardiomegaly.  MICROBIOLOGY [facility]): Blood Ctx x2 6/29>> R Hip Ctx 6/29>> (poss Staph aureus & 2 GNR)  ANTIBIOTICS: Vancomycin 06/27>> Zozyn 06/27>>   SIGNIFICANT EVENTS: 06/09 - right hip  hemiarthroplasty 06/27 - admitted with infected right hip to [facility].  Taken to OR for I&D and spacer implantation. 06/29 - Worsened renal function.  [facility] for further evaluation and management.  LINES/TUBES: R IJ CVL [facility]) > L radial A line [facility]) > FOLEY [facility]) >  ASSESSMENT / PLAN:  INFECTIOUS A:   Sepsis - Due to infected right hip; s/p I&D 11/14/15. R Hip Infection Possible LLL HCAP  P:   Empiric Vancomycin & Zosyn Follow cultures from [place]. Trending Procalcitonin per algorithm Plan to re-culture for fever  CARDIOVASCULAR A:  Bradycardia - Reportedly 3rd degree AVB at [facility]. H/O PAF - On outpt Eliquis. H/O HTN H/O CAD H/O HLD  P:  Goal MAP > 65 & SBP > 90 Dopamine gtt to maintain HR >50, MAP & SBP. Heparin gtt per pharmacy protocol Continuing outpt Lipitor Hold outpatient amiodarone, amlodipine & lisinopril.  Cardiology following  Checking TTE-pending   RENAL A:   Acute Renal Failure>no hydronephrosis on renal US  NAGMA Hyponatremia  Hypomagnesium  Pseudo-Hypocalcemia - S/P 1gm Calcium Gluconate. Corrected w/ Albumin to 8.9.  P:   Bicarb d/c  7/1 per renal  Trending UOP with Foley Monitoring electrolytes & renal function daily CRRT per renal  Replacing electrolytes as indicated Nephrology following .  Replace Mg.   PULMONARY A: Acute Hypoxic Respiratory Failure Possible LLL HCAP  P:   Weaning FiO2 for Sat >92% Continuous Pulse Ox BiPAP prn work of breathing  GASTROINTESTINAL A:   H/O GERD Morbid obesity  P:   Begin Carb mod  diet  Protonix PO daily D/c D5 when diet started.   HEMATOLOGIC A:   Leukocytosis - Likely secondary to sepsis. Anemia - No signs of active bleeding. Hgb stable. Chronic Anticoagulation - Eliquis as outpatient.  P:  Trending cell counts daily w/ CBC Heparin gtt per pharmacy protocol Holding Eliquis  ENDOCRINE A:   H/O DM Type 2 H/O Hypothyroidism - TSH  1.861.  P:   Accu-Checks tidac/qhs  Change to oral Synthroid  Hold outpatient Lantus & Metformin.  NEUROLOGIC A:   H/O Anxiety/Depression H/O Neurogenic Bladder - Chronic foley placed at [facility].  P:   Monitor mental status closely for withdrawal Hold outpatient alprazolam, gabapentin, sertraline.  ORTHOPEDICS A: Septic Right Hip - S/P I/D 6/27 w/ spacer & wound vac. R Fem Fracture - S/P R Hip Hemiarthroplasty 6/9.   P: Orthopedics following  Fentanyl IV prn severe pain Wound Care C/S for Wound Vac    Family updated: No family available.  Interdisciplinary Family Meeting v Palliative Care Meeting:  Due by: 11/22/15.   TODAY'S SUMMARY:  81 y.o. F transferred from [place] where she was admitted 6/27 due to infected right hip (right hip arthroplasty performed 6/9).  Patient now has bradycardia from junctional rhythm. Patient requiring low-dose vasopressor support for blood pressure and heart rate. Cardiology consulted  for further treatment recommendations. Consult orthopedics given septic joint with wound VAC and spacer in place. Nephrology consulted  given ongoing renal failure. Now on CRRT . Patient has multisystem organ failure from her sepsis. Weaned off BIPAP overnight. Start diet. And change to oral rx.   Keedan Sample NP-C  [facility]  [phone]     11/18/2015, 9:29 AM

## 2015-11-18 NOTE — Progress Notes (Signed)
Subjective:  Lying in bed currently on dialysis, no acute distress, no complaints of chest pain or shortness of breath.  Objective:  Vital Signs in the last 24 hours: BP 114/85 mmHg  Pulse 56  Temp(Src) 97.4 F (36.3 C) (Axillary)  Resp 10  Ht 5\' 2"  (1.575 m)  Wt 112.3 kg (247 lb 9.2 oz)  BMI 45.27 kg/m2  SpO2 100%  Physical Exam: Obese female currently on dialysis in no acute distress Lungs:  Clear Cardiac:  Regular rhythm, normal S1 and S2, no S3 Extremities:  No edema present  Intake/Output from previous day: 06/30 0701 - 07/01 0700 In: 3880.5 [I.V.:3180.5; IV Piggyback:700] Out: 1932 [Urine:695; Drains:200]  Weight Filed Weights   11/17/15 0500 11/18/15 0200 11/18/15 0500  Weight: 111.5 kg (245 lb 13 oz) 112.9 kg (248 lb 14.4 oz) 112.3 kg (247 lb 9.2 oz)    Lab Results: Basic Metabolic Panel:  Recent Labs  01/19/16 1620 11/18/15 0502  NA 128* 130*  K 5.4* 4.1  CL 98* 99*  CO2 21* 23  GLUCOSE 97 125*  BUN 46* 37*  CREATININE 4.68* 3.55*   CBC:  Recent Labs  11/16/15 2325 11/17/15 0500 11/18/15 0501  WBC 16.7* 23.4* 17.4*  NEUTROABS 15.5*  --  14.1*  HGB 8.9* 8.4* 7.3*  HCT 27.2* 25.7* 22.4*  MCV 88.3 88.3 87.2  PLT 344 457* 413*   Cardiac Enzymes:  Cardiac Panel (last 3 results)  Recent Labs  11/16/15 2325  TROPONINI 0.06*    Telemetry: Sinus bradycardia currently  Assessment/Plan:  1.  Paroxysmal atrial fibrillation 2.  Bradycardia likely due to amiodarone and metoprolol 3.  Severe anemia likely due to blood loss 4.  Acute renal failure 5.  Prior history of stroke  Recommendations:  Continue to hold metoprolol and amiodarone at the present time.  Once she is more stable might consider restarting amiodarone.     11/18/15  MD [facility]  11/18/2015, 11:43 AM

## 2015-11-18 NOTE — Progress Notes (Signed)
ANTICOAGULATION CONSULT NOTE - Follow Up Consult  Pharmacy Consult for heparin Indication: atrial fibrillation  Labs:  Recent Labs  11/16/15 2325  11/17/15 0500 11/17/15 1100 11/17/15 1620 11/17/15 2035 11/18/15 0501 11/18/15 0502 11/18/15 1334 11/18/15 1612 11/18/15 2100  HGB 8.9*  --  8.4*  --   --   --  7.3*  --   --   --   --   HCT 27.2*  --  25.7*  --   --   --  22.4*  --   --   --   --   PLT 344  --  457*  --   --   --  413*  --   --   --   --   APTT  --   < > 38* 46*  --  71*  --   --  156*  --  129*  LABPROT 16.8*  --   --   --   --   --   --   --   --   --   --   INR 1.36  --   --   --   --   --   --   --   --   --   --   HEPARINUNFRC  --   --  1.12* 1.00*  --   --   --   --   --   --  0.80*  CREATININE 4.51*  --  4.54*  --  4.68*  --   --  3.55*  --  2.05*  --   TROPONINI 0.06*  --   --   --   --   --   --   --   --   --   --   < > = values in this interval not displayed.   Assessment: 81yo female remains above goal on heparin after rate decreases.  Goal of Therapy:  Heparin level 0.3-0.7 units/ml aPTT 66-102 seconds   Plan:  Will decrease heparin another 1-2 units/kg/hr to 800 units/hr and check PTT in 8hr.  Vernard Gambles, PharmD, BCPS  11/18/2015,11:04 PM

## 2015-11-18 NOTE — Progress Notes (Signed)
Pharmacy Antibiotic Note  [name] [name] is a 81 y.o. female admitted on 11/16/2015 with septic joint.  Pharmacy has been consulted for vancomycin and zosyn dosing.  Presenting from [place] with concern for sepsis. recent high hemiarthroplasty with surgical site infection (I&D on 6/27 with spacer and wound vac).   Pt with acute renal failure - starting CRRT 7/1 ~0100.  Plan: Continue to hold vanc 1500 VR Sat  Change Zosyn to 3.375 gm IV q6h Will f/u CRRT tolerance, micro data, and pt's clinical condition  Height: 5\' 2"  (157.5 cm) Weight: 245 lb 13 oz (111.5 kg) IBW/kg (Calculated) : 50.1  Temp (24hrs), Avg:97.3 F (36.3 C), Min:94.6 F (34.8 C), Max:98.8 F (37.1 C)   Recent Labs Lab 11/16/15 2325 11/17/15 0500 11/17/15 1200 11/17/15 1620  WBC 16.7* 23.4*  --   --   CREATININE 4.51* 4.54*  --  4.68*  LATICACIDVEN 2.3*  --   --   --   VANCORANDOM  --   --  36  --     Estimated Creatinine Clearance: 11.7 mL/min (by C-G formula based on Cr of 4.68).    Allergies  Allergen Reactions  . Oxycodone Nausea And Vomiting    Antimicrobials this admission: Zosyn 6/27>> Vancomycin 6/27>>  Vanc 1500 mg 6/27 ~ 1500, 6/29 ~ 1500 @ OSH  Dose adjustments this admission: 6/30 VR - 36  Microbiology results: Blood NGTD Hip Cx Staph aureus, GNR  MRSA PCR neg  37, PharmD, BCPS Clinical pharmacist, pager [phone] 11/18/2015 12:24 AM

## 2015-11-18 NOTE — Progress Notes (Signed)
Pharmacy Antibiotic Note  [name] [name] is a 81 y.o. female admitted on 11/16/2015 with septic joint.  Pharmacy has been consulted for vancomycin and zosyn dosing.  Cx data back from [place] in separate note  Plan: D/C vanc, zosyn Merrem 1 g q8h  Height: 5\' 2"  (157.5 cm) Weight: 247 lb 9.2 oz (112.3 kg) IBW/kg (Calculated) : 50.1  Temp (24hrs), Avg:97.4 F (36.3 C), Min:94.1 F (34.5 C), Max:98.8 F (37.1 C)   Recent Labs Lab 11/16/15 2325 11/17/15 0500 11/17/15 1200 11/17/15 1620 11/18/15 0501 11/18/15 0502  WBC 16.7* 23.4*  --   --  17.4*  --   CREATININE 4.51* 4.54*  --  4.68*  --  3.55*  LATICACIDVEN 2.3*  --   --   --   --   --   VANCORANDOM  --   --  36  --   --   --     Estimated Creatinine Clearance: 15.5 mL/min (by C-G formula based on Cr of 3.55).    Allergies  Allergen Reactions  . Oxycodone Nausea And Vomiting    Antimicrobials this admission: Zosyn 6/27>>7/1 Vancomycin 6/27>>7/1 Merrem 7/1>>  Dose adjustments this admission: 6/30 VR - 36  Microbiology results: Blood NGTD Hip Cx MSSA, Enterobacter, Kleb PNA MRSA PCR neg  37, PharmD, BCPS, [facility] Clinical Pharmacist Pager [phone] 11/18/2015 12:20 PM

## 2015-11-18 NOTE — Progress Notes (Signed)
ANTICOAGULATION CONSULT NOTE - Follow Up Consult  Pharmacy Consult for heparin Indication: atrial fibrillation  Allergies  Allergen Reactions  . Oxycodone Nausea And Vomiting    Patient Measurements: Height: 5\' 2"  (157.5 cm) Weight: 247 lb 9.2 oz (112.3 kg) IBW/kg (Calculated) : 50.1 Heparin Dosing Weight: 75 kg  Vital Signs: Temp: 97.4 F (36.3 C) (07/01 1142) Temp Source: Axillary (07/01 1142) BP: 114/85 mmHg (07/01 0400) Pulse Rate: 59 (07/01 1400)  Labs:  Recent Labs  11/16/15 2325  11/17/15 0500 11/17/15 1100 11/17/15 1620 11/17/15 2035 11/18/15 0501 11/18/15 0502 11/18/15 1334  HGB 8.9*  --  8.4*  --   --   --  7.3*  --   --   HCT 27.2*  --  25.7*  --   --   --  22.4*  --   --   PLT 344  --  457*  --   --   --  413*  --   --   APTT  --   < > 38* 46*  --  71*  --   --  156*  LABPROT 16.8*  --   --   --   --   --   --   --   --   INR 1.36  --   --   --   --   --   --   --   --   HEPARINUNFRC  --   --  1.12* 1.00*  --   --   --   --   --   CREATININE 4.51*  --  4.54*  --  4.68*  --   --  3.55*  --   TROPONINI 0.06*  --   --   --   --   --   --   --   --   < > = values in this interval not displayed.  Estimated Creatinine Clearance: 15.5 mL/min (by C-G formula based on Cr of 3.55).  Medication Infusion: . dextrose 5 % and 0.45% NaCl 50 mL/hr at 11/17/15 2000  . DOPamine 5.901 mcg/kg/min (11/18/15 1110)  . heparin 1,150 Units/hr (11/18/15 1104)  . dialysis replacement fluid (prismasate) 500 mL/hr at 11/18/15 1039  . dialysis replacement fluid (prismasate) 300 mL/hr at 11/18/15 1044  . dialysate (PRISMASATE) 1,500 mL/hr at 11/18/15 1034    Assessment: 81 yo f presenting from [place] with concern for sepsis. recent high hemiarthroplasty with surgical site infection (I&D on 6/27 with spacer and wound vac). Put on isuprel after HR in 20s, anemia, and renal failure  PMH: DM, AFib on apixaban, HLD, CAD, RA  AC: on apixaban for Afib pta - held for OR at OSH.  Now on heparin gtt 1150 units/hr - aPTT = 156  Nephro: SCr 3.55 (AKI) now on CRRT. Na 130, Mg 1.3  Heme: H&H 7.3/22.4, Plt 413  Goal of Therapy:  Heparin level 0.3-0.7 units/ml aPTT 66 - 102 s seconds Monitor platelets by anticoagulation protocol: Yes   Plan:  Stop heparin x 1 hr Reinitiate at 950 units/hr Repeat aPTT at 2200 Daily HL, aPTT, CBC (can d/c aptt once correlating)  07-07-1968, PharmD, BCPS, [facility] Clinical Pharmacist Pager [phone] 11/18/2015 3:05 PM

## 2015-11-19 ENCOUNTER — Inpatient Hospital Stay (HOSPITAL_COMMUNITY): Payer: Medicare Other

## 2015-11-19 DIAGNOSIS — I361 Nonrheumatic tricuspid (valve) insufficiency: Secondary | ICD-10-CM

## 2015-11-19 LAB — ECHOCARDIOGRAM COMPLETE
AOPV: 0.61 m/s
AV Area VTI index: 0.84 cm2/m2
AV Area VTI: 1.91 cm2
AV Area mean vel: 1.86 cm2
AV Mean grad: 10 mmHg
AV Peak grad: 17 mmHg
AV peak Index: 0.84
AVA: 1.9 cm2
AVAREAMEANVIN: 0.82 cm2/m2
AVCELMEANRAT: 0.59
AVLVOTPG: 6 mmHg
AVPKVEL: 209 cm/s
Area-P 1/2: 1.86 cm2
CHL CUP AV VEL: 1.9
CHL CUP RV SYS PRESS: 57 mmHg
CHL CUP TV REG PEAK VELOCITY: 341 cm/s
DOP CAL AO MEAN VELOCITY: 148 cm/s
E decel time: 243 msec
E/e' ratio: 25.2
FS: 37 % (ref 28–44)
HEIGHTINCHES: 62 in
IVS/LV PW RATIO, ED: 0.84
LA ID, A-P, ES: 45 mm
LA diam end sys: 45 mm
LA vol index: 41.1 mL/m2
LA vol: 93.1 mL
LADIAMINDEX: 1.99 cm/m2
LAVOLA4C: 87.4 mL
LV E/e' medial: 25.2
LV TDI E'LATERAL: 7.62
LV TDI E'MEDIAL: 6.09
LVEEAVG: 25.2
LVELAT: 7.62 cm/s
LVOT SV: 106 mL
LVOT VTI: 33.9 cm
LVOT area: 3.14 cm2
LVOTD: 20 mm
LVOTPV: 127 cm/s
LVOTVTI: 0.61 cm
MV Dec: 243
MV Peak grad: 15 mmHg
MV pk E vel: 192 m/s
MVPKAVEL: 112 m/s
MVSPHT: 118 ms
PW: 11.5 mm — AB (ref 0.6–1.1)
RV LATERAL S' VELOCITY: 14.7 cm/s
RV TAPSE: 26.3 mm
TRMAXVEL: 341 cm/s
VTI: 55.9 cm
Valve area index: 0.84
Weight: 3897.73 oz

## 2015-11-19 LAB — CBC
HCT: 20.9 % — ABNORMAL LOW (ref 36.0–46.0)
HEMATOCRIT: 24.5 % — AB (ref 36.0–46.0)
HEMATOCRIT: 23.8 % — AB (ref 36.0–46.0)
HEMOGLOBIN: 8.1 g/dL — AB (ref 12.0–15.0)
HEMOGLOBIN: 7.7 g/dL — AB (ref 12.0–15.0)
Hemoglobin: 6.8 g/dL — CL (ref 12.0–15.0)
MCH: 29.1 pg (ref 26.0–34.0)
MCH: 29.5 pg (ref 26.0–34.0)
MCH: 28.9 pg (ref 26.0–34.0)
MCHC: 32.5 g/dL (ref 30.0–36.0)
MCHC: 33.1 g/dL (ref 30.0–36.0)
MCHC: 32.4 g/dL (ref 30.0–36.0)
MCV: 89.3 fL (ref 78.0–100.0)
MCV: 89.1 fL (ref 78.0–100.0)
MCV: 89.5 fL (ref 78.0–100.0)
PLATELETS: 332 10*3/uL (ref 150–400)
Platelets: 332 10*3/uL (ref 150–400)
Platelets: 279 10*3/uL (ref 150–400)
RBC: 2.34 MIL/uL — AB (ref 3.87–5.11)
RBC: 2.75 MIL/uL — AB (ref 3.87–5.11)
RBC: 2.66 MIL/uL — AB (ref 3.87–5.11)
RDW: 16.4 % — ABNORMAL HIGH (ref 11.5–15.5)
RDW: 15.8 % — ABNORMAL HIGH (ref 11.5–15.5)
RDW: 16.3 % — AB (ref 11.5–15.5)
WBC: 12.6 10*3/uL — ABNORMAL HIGH (ref 4.0–10.5)
WBC: 13.1 10*3/uL — ABNORMAL HIGH (ref 4.0–10.5)
WBC: 11.6 10*3/uL — AB (ref 4.0–10.5)

## 2015-11-19 LAB — GLUCOSE, CAPILLARY
GLUCOSE-CAPILLARY: 114 mg/dL — AB (ref 65–99)
GLUCOSE-CAPILLARY: 121 mg/dL — AB (ref 65–99)
GLUCOSE-CAPILLARY: 126 mg/dL — AB (ref 65–99)
Glucose-Capillary: 133 mg/dL — ABNORMAL HIGH (ref 65–99)
Glucose-Capillary: 152 mg/dL — ABNORMAL HIGH (ref 65–99)
Glucose-Capillary: 121 mg/dL — ABNORMAL HIGH (ref 65–99)

## 2015-11-19 LAB — BASIC METABOLIC PANEL
Anion gap: 7 (ref 5–15)
BUN: 12 mg/dL (ref 6–20)
CHLORIDE: 102 mmol/L (ref 101–111)
CO2: 26 mmol/L (ref 22–32)
Calcium: 7 mg/dL — ABNORMAL LOW (ref 8.9–10.3)
Creatinine, Ser: 1.23 mg/dL — ABNORMAL HIGH (ref 0.44–1.00)
GFR calc Af Amer: 47 mL/min — ABNORMAL LOW (ref >60)
GFR calc non Af Amer: 41 mL/min — ABNORMAL LOW (ref >60)
GLUCOSE: 131 mg/dL — AB (ref 65–99)
POTASSIUM: 3.8 mmol/L (ref 3.5–5.1)
Sodium: 135 mmol/L (ref 135–145)

## 2015-11-19 LAB — HEPARIN LEVEL (UNFRACTIONATED): HEPARIN UNFRACTIONATED: 0.53 [IU]/mL (ref 0.30–0.70)

## 2015-11-19 LAB — PREPARE RBC (CROSSMATCH)

## 2015-11-19 LAB — MAGNESIUM: Magnesium: 2 mg/dL (ref 1.7–2.4)

## 2015-11-19 LAB — APTT: aPTT: 94 seconds — ABNORMAL HIGH (ref 24–37)

## 2015-11-19 LAB — PHOSPHORUS: Phosphorus: 2.4 mg/dL — ABNORMAL LOW (ref 2.5–4.6)

## 2015-11-19 MED ORDER — SERTRALINE HCL 50 MG PO TABS
50.0000 mg | ORAL_TABLET | Freq: Every day | ORAL | Status: DC
  Administered 2015-11-19 – 2015-11-27 (×8): 50 mg via ORAL
  Filled 2015-11-19 (×9): qty 1

## 2015-11-19 MED ORDER — SODIUM CHLORIDE 0.9 % IV SOLN
1.0000 g | Freq: Two times a day (BID) | INTRAVENOUS | Status: DC
  Administered 2015-11-19 – 2015-11-24 (×11): 1 g via INTRAVENOUS
  Filled 2015-11-19 (×12): qty 1

## 2015-11-19 MED ORDER — SODIUM CHLORIDE 0.9 % IV SOLN
Freq: Once | INTRAVENOUS | Status: AC
  Administered 2015-11-19: 06:00:00 via INTRAVENOUS

## 2015-11-19 NOTE — Progress Notes (Signed)
Subjective:  Lying in bed currently on dialysis, no acute distress, no complaints of chest pain or shortness of breath.  Objective:  Vital Signs in the last 24 hours: BP 132/56 mmHg  Pulse 71  Temp(Src) 97.6 F (36.4 C) (Oral)  Resp 13  Ht 5\' 2"  (1.575 m)  Wt 110.5 kg (243 lb 9.7 oz)  BMI 44.55 kg/m2  SpO2 100%  Physical Exam: Obese female currently on dialysis in no acute distress Lungs:  Clear Cardiac:  Regular rhythm, normal S1 and S2, no S3 Extremities:  No edema present  Intake/Output from previous day: 07/01 0701 - 07/02 0700 In: 2405.6 [I.V.:1863.1; Blood:142.5; IV Piggyback:400] Out: 5687 [Urine:1025; Drains:330; Stool:250]  Weight Filed Weights   11/18/15 0200 11/18/15 0500 11/19/15 0500  Weight: 112.9 kg (248 lb 14.4 oz) 112.3 kg (247 lb 9.2 oz) 110.5 kg (243 lb 9.7 oz)    Lab Results: Basic Metabolic Panel:  Recent Labs  01/20/16 1612 11/19/15 0445  NA 134* 135  K 3.6 3.8  CL 103 102  CO2 24 26  GLUCOSE 69 131*  BUN 22* 12  CREATININE 2.05* 1.23*   CBC:  Recent Labs  11/16/15 2325  11/18/15 0501 11/19/15 0445 11/19/15 1000  WBC 16.7*  < > 17.4* 12.6* 13.1*  NEUTROABS 15.5*  --  14.1*  --   --   HGB 8.9*  < > 7.3* 6.8* 8.1*  HCT 27.2*  < > 22.4* 20.9* 24.5*  MCV 88.3  < > 87.2 89.3 89.1  PLT 344  < > 413* 332 332  < > = values in this interval not displayed. Cardiac Enzymes:  Cardiac Panel (last 3 results)  Recent Labs  11/16/15 2325  TROPONINI 0.06*    Telemetry: Sinus rhythm  Assessment/Plan:  1.  Paroxysmal atrial fibrillation currently in NSR 2.  Bradycardia likely due to amiodarone and metoprolol 3.  Severe anemia likely due to blood loss 4.  Acute renal failure 5.  Prior history of stroke  Recommendations:  Continue to hold metoprolol and amiodarone at the present time. Restart amio when more stable   W. 11/18/15  MD [facility]  11/19/2015, 11:05 AM

## 2015-11-19 NOTE — Progress Notes (Signed)
eLink Physician-Brief Progress Note Patient Name: SHAUNTEA LOK DOB: 03-Aug-1934 MRN: [id number]   Date of Service  11/19/2015  HPI/Events of Note  Hgb drop from 7.3 to 6.8  eICU Interventions  Plan: Transfuse 1 unit pRBC Post-transfusion CBC     Intervention Category Intermediate Interventions: Other:  DETERDING,ELIZABETH 11/19/2015, 5:38 AM

## 2015-11-19 NOTE — Progress Notes (Signed)
Pharmacy Antibiotic Note  [name] [name] is a 81 y.o. female admitted on 11/16/2015 with septic joint.  CRRT to stop this AM  Cx data back from [place] in separate note  Plan: Adjust Merrem to 1 g q12h  Height: 5\' 2"  (157.5 cm) Weight: 243 lb 9.7 oz (110.5 kg) IBW/kg (Calculated) : 50.1  Temp (24hrs), Avg:97.7 F (36.5 C), Min:97.4 F (36.3 C), Max:98.6 F (37 C)   Recent Labs Lab 11/16/15 2325 11/17/15 0500 11/17/15 1200 11/17/15 1620 11/18/15 0501 11/18/15 0502 11/18/15 1612 11/19/15 0445  WBC 16.7* 23.4*  --   --  17.4*  --   --  12.6*  CREATININE 4.51* 4.54*  --  4.68*  --  3.55* 2.05* 1.23*  LATICACIDVEN 2.3*  --   --   --   --   --   --   --   VANCORANDOM  --   --  36  --   --   --   --   --     Estimated Creatinine Clearance: 44.2 mL/min (by C-G formula based on Cr of 1.23).    Allergies  Allergen Reactions  . Oxycodone Nausea And Vomiting    Antimicrobials this admission: Zosyn 6/27>>7/1 Vancomycin 6/27>>7/1 Merrem 7/1>>  Dose adjustments this admission: 6/30 VR - 36  Microbiology results: Blood NGTD Hip Cx MSSA, Enterobacter MRSA PCR neg  37, PharmD, BCPS, [facility] Clinical Pharmacist Pager [phone] 11/19/2015 9:06 AM

## 2015-11-19 NOTE — Progress Notes (Signed)
PULMONARY / CRITICAL CARE MEDICINE   Name: [name] [name] MRN: [id number] DOB: 06-16-1934    ADMISSION DATE:  11/16/2015 CONSULTATION DATE:  11/16/15  REFERRING MD:  [facility]  CHIEF COMPLAINT:  Sepsis  HISTORY OF PRESENT ILLNESS:  Pt is encephelopathic; therefore, this HPI is obtained from chart review. [name] [name] is a 81 y.o. female with PMH as outlined below including recent right hip hemiarthroplasty roughly 10/27/15 complicated by infected surgical site.  She was admitted again to [facility] on 6/27 and underwent I&D of the right hip with implantation of spacer and wound vac and was started on empiric vanc / zosyn.  In addition, she had severe bradycardia with HR in the 20's.  She was seen in consultation by cardiology who felt that bradycardia was related to amiodarone and metoprolol; therefore, these were discontinued and pt was started on Isuprel.  Her hospital stay was complicated by anemia for which she received 4u PRBC transfusion along with worsening renal failure. She was transferred to [facility] 6/29 for further evaluation and management.  SUBJECTIVE:  No acute events overnight.   Weaned off dopamine 7/1 . Plans to stop CRRT today per renal.  Awake and alert this am . Feeling some better. Dry cough is decreased    VITAL SIGNS: BP 90/74 mmHg  Pulse 65  Temp(Src) 97.6 F (36.4 C) (Oral)  Resp 15  Ht 5\' 2"  (1.575 m)  Wt 243 lb 9.7 oz (110.5 kg)  BMI 44.55 kg/m2  SpO2 100%  HEMODYNAMICS:    VENTILATOR SETTINGS:    INTAKE / OUTPUT: I/O last 3 completed shifts: In: 4282.3 [I.V.:3639.8; Blood:142.5; IV Piggyback:500] Out: 7529 [Urine:1630; Drains:530; ; Stool:250]   PHYSICAL EXAMINATION: General: Obese female. No distress.  Neuro: Awake. Follows commands. Moving all distal 4 extremities equally. HEENT:  No scleral icterus or injection. Cardiovascular: Bradycardic with junctional rhythm on telemetry. Unable to appreciate JVD with body  habitus. Lungs: Normal work of breathing  Distant breath sounds bilaterally. Abdomen: Soft. Protuberant. Nontender. Musculoskeletal: Wound VAC in place over right hip. No other joint deformity appreciated. Skin: Warm and dry. No rash on exposed skin.  LABS:  BMET  Recent Labs Lab 11/18/15 0502 11/18/15 1612 11/19/15 0445  NA 130* 134* 135  K 4.1 3.6 3.8  CL 99* 103 102  CO2 23 24 26   BUN 37* 22* 12  CREATININE 3.55* 2.05* 1.23*  GLUCOSE 125* 69 131*    Electrolytes  Recent Labs Lab 11/16/15 2325  11/18/15 0501 11/18/15 0502 11/18/15 1612 11/19/15 0445  CALCIUM 6.8*  < >  --  6.6* 6.7* 7.0*  MG 1.1*  --  1.3*  --   --  2.0  PHOS 5.7*  --   --  4.7* 3.4 2.4*  < > = values in this interval not displayed.  CBC  Recent Labs Lab 11/17/15 0500 11/18/15 0501 11/19/15 0445  WBC 23.4* 17.4* 12.6*  HGB 8.4* 7.3* 6.8*  HCT 25.7* 22.4* 20.9*  PLT 457* 413* 332    Coag's  Recent Labs Lab 11/16/15 2325  11/18/15 1334 11/18/15 2100 11/19/15 0810  APTT  --   < > 156* 129* 94*  INR 1.36  --   --   --   --   < > = values in this interval not displayed.  Sepsis Markers  Recent Labs Lab 11/16/15 2325 11/17/15 0049 11/17/15 0500 11/18/15 0501  LATICACIDVEN 2.3*  --   --   --   PROCALCITON  --  0.60  0.64 0.48    ABG  Recent Labs Lab 11/16/15 2312 11/16/15 2356  PHART 7.268* 7.315*  PCO2ART 32.8* 31.3*  PO2ART 103.0* 119.0*    Liver Enzymes  Recent Labs Lab 11/16/15 2325 11/18/15 0502 11/18/15 1612  AST 15  --   --   ALT 8*  --   --   ALKPHOS 108  --   --   BILITOT 0.5  --   --   ALBUMIN 1.5* 1.6* 1.5*    Cardiac Enzymes  Recent Labs Lab 11/16/15 2325  TROPONINI 0.06*    Glucose  Recent Labs Lab 11/18/15 1605 11/18/15 2032 11/18/15 2158 11/19/15 0017 11/19/15 0412 11/19/15 0744  GLUCAP 72 111* 111* 114* 133* 152*    Imaging No results found.   STUDIES:  TTE 07/20/15:Atrial fibrillation. LVEF 55-60%. Normal wall  motion. Unable to assess diastolic function. Mild mitral regurgitation. RV normal in size and function. Pulmonary artery systolic pressure 38 mmHg. EKG 6/29:  Junctional bradycardia. Port CXR 6/30:  Right hilar fullness with silhouetting of the left hemidiaphragm suggestive of retrocardiac opacity. Globular heart with suggestion of cardiomegaly.  MICROBIOLOGY [facility]): Blood Ctx x2 6/29>> R Hip Ctx 6/29>> (poss Staph aureus & 2 GNR)>Staph Aureus(MSSA) , Enterobacter , Klebsiella PNA   ANTIBIOTICS: Vancomycin 06/27>>7/1 Zozyn 06/27>> 7/1 Merrem 7/1 >>  SIGNIFICANT EVENTS: 06/09 - right hip hemiarthroplasty 06/27 - admitted with infected right hip to [facility].  Taken to OR for I&D and spacer implantation. 06/29 - Worsened renal function.  [facility]transferred to [facility] for further evaluation and management. 7/1 CRRT started  7/2 CRRT stopped.   LINES/TUBES: R IJ CVL [facility]) > L radial A line [facility]) > FOLEY [facility]) >  ASSESSMENT / PLAN:  INFECTIOUS A:   Sepsis - Due to infected right hip; s/p I&D 11/14/15. R Hip Infection-MSSA, Enterobacter, Klebsiella  Possible LLL HCAP  P:   Vanc and Zosyn off 7/1 Merrem day #2  Plan to re-culture for fever  CARDIOVASCULAR A:  Bradycardia - Reportedly 3rd degree AVB at [facility]. H/O PAF - On outpt Eliquis. H/O HTN H/O CAD H/O HLD  P:  Goal MAP > 65 & SBP > 90 Dopamine gtt off 7/1  Heparin gtt per pharmacy protocol Continuing outpt Lipitor Hold outpatient amiodarone, amlodipine & lisinopril.  Cardiology following  Checking TTE-pending   RENAL A:   Acute Renal Failure>no hydronephrosis on renal US , CRRT started 7/1 to 7/2  NAGMA Hyponatremia -resolved  Hypomagnesium -resolved  Pseudo-Hypocalcemia - S/P 1gm Calcium Gluconate. Corrected w/ Albumin to 8.9.  P:   Trending UOP with Foley Monitoring electrolytes & renal function daily CRRT to stop 7/2 .  Replacing electrolytes as indicated Nephrology following .     PULMONARY A: Acute Hypoxic Respiratory Failure-improving  Possible LLL HCAP  P:   Weaning FiO2 for Sat >92% Continuous Pulse Ox BiPAP prn work of breathing  GASTROINTESTINAL A:   H/O GERD Morbid obesity  P:   Cont  Carb mod  diet  Protonix PO daily D/c D5 IVF .   HEMATOLOGIC A:   Leukocytosis - improved  Anemia - No signs of active bleeding. Hgb trending down s/p 1 u PRBC 7/2  Chronic Anticoagulation - Eliquis as outpatient.  P:  Trending cell counts daily w/ CBC Heparin gtt per pharmacy protocol-monitor closely w/ hbg tr down  Holding Eliquis  ENDOCRINE A:   H/O DM Type 2 H/O Hypothyroidism - TSH 1.861.  P:   Accu-Checks tidac/qhs  Cont  Synthroid  Hold  outpatient Lantus & Metformin.  NEUROLOGIC A:   H/O Anxiety/Depression H/O Neurogenic Bladder - Chronic foley placed at [facility].  P:   Monitor mental status closely for withdrawal Hold outpatient alprazolam, gabapentin Restart OP zoloft   ORTHOPEDICS A: Septic Right Hip - S/P I/D 6/27 w/ spacer & wound vac. R Fem Fracture - S/P R Hip Hemiarthroplasty 6/9.   P: Orthopedics following  Fentanyl IV prn severe pain Wound Care C/S for Wound Vac    Family updated: No family available.  Interdisciplinary Family Meeting v Palliative Care Meeting:  Due by: 11/22/15.   TODAY'S SUMMARY:  81 y.o. F transferred from [place] where she was admitted 6/27 due to infected right hip (right hip arthroplasty performed 6/9).  Patient now has bradycardia from junctional rhythm. Weaned off dopamine 7/1 . Orthopedics following  given septic joint with wound VAC and spacer in place. Nephrology following for ongoing renal failure. UOP good, scr tr down . CRRT to stop today at 11am .     Donato Studley NP-C  [facility]  [phone]     11/19/2015, 8:56 AM

## 2015-11-19 NOTE — Progress Notes (Signed)
[facility] Progress Note  81 y.o. female with a history of DM, PAF, CASHD, CHF, Hypothyroidism and w/ a baseline creatinine of 1.1-1.3 who recently had a right hip hemiarthroplasty ~10/27/15 complicated by an infection of the surgical site. On 6/27 she had an I&D of the right hip with implantation fo spacer and wound vac + initiation of broad spectrum Abx (Vanc + Zosyn). She also had bradycardia with rates in the 20's thought by cardiology to be secondary to amiodarone and metoprolol --> initiation of Isoproterenol. Transfer to [facility] on 6/29 for sepsis. She was started on Dopamine and UOP was noted to be very low with a Cr of 4.54, mild hyperkalemia and a nongap metabolic acidosis. Poor response to IVF and bicarb; therefore, initiated on CRRT late on 6/30.   Assessment/ Plan:   1. Acute Kidney Injury on CKD III with a baseline Cr ~1.1-1.3. She has reasons to be in ATN with sepsis + likely Hypotension during periods of severe bradycardia. I am not sure if she received contrast at [facility]. She is already Stage III AKI by KDIGO based on a 3x increase from BL Cr. - I reviewed the urine microscopy personally and do not see pigmented casts suggestive of ATN but by history she has every reason to be in ATN. There are NUMEROUS WBC's in the urine but no WBC casts, only a few RBC's which are normomorphic, + yeast. WBC's may be from the chronic indwelling catheter; AIN is lower on the differential. - Ultrasound --> no hydro as expected w/ foley in place; nl sized kidneys. - Hyperkalemic resolved on CRRT. - D#3 on CRRT (initiated late on 6/30). - Stopped sterile water +3 amps HCO3 on 7/1 - She is tolerating UF of net 182ml/hr on a 4K bath - Her UOP is very good with resolution of the acidosis and hyperkalemia on CRRT.  --> let's stop the CRRT and see if she is starting to recover. 2. Hyponatremia - Hyponatremia which is likely secondary to the Acute Kidney Injury resolved on CRRT.  3. Sepsis -  most likely due to the right hip with recent I&D on 11/14/15 --> on broad spectrum Abxs. 4. Bradycardia - appears to have resolved holding the offending medications. 5. Right hip arthroplasty and I&D w/ revision. 6. Hypothyroidism 7. DM  Subjective:   Multiple complaints including dyspnea (not improved), fever, chills. I'm not sure how reliable this ROS is. She is certainly disoriented.  Seen on CRRT @730am  - Qb216ml/min - Pre/Post 500/300 ml/hr - Qd 159ml/hr -4k bath - UF 179ml/hr net neg   Objective:   BP 90/74 mmHg  Pulse 65  Temp(Src) 97.6 F (36.4 C) (Oral)  Resp 15  Ht 5\' 2"  (1.575 m)  Wt 110.5 kg (243 lb 9.7 oz)  BMI 44.55 kg/m2  SpO2 100%  Intake/Output Summary (Last 24 hours) at 11/19/15 0754 Last data filed at 11/19/15 0700  Gross per 24 hour  Intake 2405.55 ml  Output   5687 ml  Net -3281.45 ml   Weight change: -2.4 kg (-5 lb 4.7 oz)  Physical Exam: General appearance: appears stated age, mild distress and slowed mentation, but pleasant Resp: poor air movement w/ no rales noted Chest wall: no tenderness Cardio: regular rate and rhythm, S1, S2 normal, no murmur, click, rub or gallop and w/ rate in the low 60's GI: soft, non-tender; bowel sounds decr freq Extremities: edema tr Pulses: 2+ and symmetric Neurologic: Mental status: alertness: confused   Imaging: 01/20/16 Renal  11/17/2015  CLINICAL DATA:  Acute renal failure EXAM: RENAL / URINARY TRACT ULTRASOUND COMPLETE COMPARISON:  11/15/2015 FINDINGS: Right Kidney: Length: 11.5 cm. Echogenicity within normal limits. No mass or hydronephrosis visualized. Left Kidney: Length: 10.7 cm. Echogenicity within normal limits. No mass or hydronephrosis visualized. Bladder: Decompressed with Foley catheter in place IMPRESSION: No acute findings.  No hydronephrosis. Electronically Signed   By: Charlett Nose M.D.   On: 11/17/2015 12:56   Dg Chest Port 1 View  11/18/2015  CLINICAL DATA:  81 year old female with renal failure  EXAM: PORTABLE CHEST 1 VIEW COMPARISON:  Chest radiograph dated 11/17/2015 FINDINGS: Right IJ central line with tip who in stable positioning. Left IJ dialysis catheter terminates at cavoatrial junction. The patient is rotated to the left. There is stable cardiomegaly with central vascular and interstitial prominence compatible with congestive changes. Left lung base opacity may represent atelectatic changes. Pneumonia is not excluded. Clinical correlation is recommended. Small left pleural effusion may be present. There is osteopenia with degenerative changes of the spine. No acute fracture. IMPRESSION: Cardiomegaly with congestive changes, new from prior study. Left lung base atelectasis versus infiltrate. Electronically Signed   By: Elgie Collard M.D.   On: 11/18/2015 00:37    Labs: BMET  Recent Labs Lab 11/16/15 2325 11/17/15 0500 11/17/15 1620 11/18/15 0502 11/18/15 1612 11/19/15 0445  NA 127* 126* 128* 130* 134* 135  K 5.3* 5.3* 5.4* 4.1 3.6 3.8  CL 103 97* 98* 99* 103 102  CO2 17* 19* 21* 23 24 26   GLUCOSE 277* 206* 97 125* 69 131*  BUN 45* 44* 46* 37* 22* 12  CREATININE 4.51* 4.54* 4.68* 3.55* 2.05* 1.23*  CALCIUM 6.8* 6.9* 6.8* 6.6* 6.7* 7.0*  PHOS 5.7*  --   --  4.7* 3.4 2.4*   CBC  Recent Labs Lab 11/16/15 2325 11/17/15 0500 11/18/15 0501 11/19/15 0445  WBC 16.7* 23.4* 17.4* 12.6*  NEUTROABS 15.5*  --  14.1*  --   HGB 8.9* 8.4* 7.3* 6.8*  HCT 27.2* 25.7* 22.4* 20.9*  MCV 88.3 88.3 87.2 89.3  PLT 344 457* 413* 332    Medications:    . antiseptic oral rinse  7 mL Mouth Rinse q12n4p  . atorvastatin  20 mg Oral q1800  . chlorhexidine  15 mL Mouth Rinse BID  . insulin aspart  0-9 Units Subcutaneous TID WC  . levothyroxine  150 mcg Oral QAC breakfast  . meropenem (MERREM) IV  1 g Intravenous Q8H  . pantoprazole  40 mg Oral Q1200      01/20/16, MD 11/19/2015, 7:54 AM

## 2015-11-19 NOTE — Progress Notes (Signed)
ANTICOAGULATION CONSULT NOTE - Follow Up Consult  Pharmacy Consult for heparin Indication: atrial fibrillation  Allergies  Allergen Reactions  . Oxycodone Nausea And Vomiting    Patient Measurements: Height: 5\' 2"  (157.5 cm) Weight: 243 lb 9.7 oz (110.5 kg) IBW/kg (Calculated) : 50.1 Heparin Dosing Weight: 75 kg  Vital Signs: Temp: 97.6 F (36.4 C) (07/02 0700) Temp Source: Oral (07/02 0700) BP: 90/74 mmHg (07/02 0700) Pulse Rate: 65 (07/02 0700)  Labs:  Recent Labs  11/16/15 2325  11/17/15 0500 11/17/15 1100  11/18/15 0501 11/18/15 0502 11/18/15 1334 11/18/15 1612 11/18/15 2100 11/19/15 0445 11/19/15 0810 11/19/15 0811  HGB 8.9*  --  8.4*  --   --  7.3*  --   --   --   --  6.8*  --   --   HCT 27.2*  --  25.7*  --   --  22.4*  --   --   --   --  20.9*  --   --   PLT 344  --  457*  --   --  413*  --   --   --   --  332  --   --   APTT  --   < > 38* 46*  < >  --   --  156*  --  129*  --  94*  --   LABPROT 16.8*  --   --   --   --   --   --   --   --   --   --   --   --   INR 1.36  --   --   --   --   --   --   --   --   --   --   --   --   HEPARINUNFRC  --   < > 1.12* 1.00*  --   --   --   --   --  0.80*  --   --  0.53  CREATININE 4.51*  --  4.54*  --   < >  --  3.55*  --  2.05*  --  1.23*  --   --   TROPONINI 0.06*  --   --   --   --   --   --   --   --   --   --   --   --   < > = values in this interval not displayed.  Estimated Creatinine Clearance: 44.2 mL/min (by C-G formula based on Cr of 1.23).  Medication Infusion: . dextrose 5 % and 0.45% NaCl 50 mL/hr at 11/18/15 1735  . DOPamine 5.901 mcg/kg/min (11/18/15 1110)  . heparin 800 Units/hr (11/19/15 0700)  . dialysis replacement fluid (prismasate) 500 mL/hr at 11/18/15 2203  . dialysis replacement fluid (prismasate) 300 mL/hr at 11/19/15 0515  . dialysate (PRISMASATE) 1,500 mL/hr at 11/19/15 0503    Assessment: 81 yo f presenting from [name] with concern for sepsis. recent high hemiarthroplasty with  surgical site infection (I&D on 6/27 with spacer and wound vac). Put on isuprel after HR in 20s, anemia, and renal failure  PMH: DM, AFib on apixaban, HLD, CAD, RA  AC: on apixaban for Afib pta - held for OR at OSH. Now on heparin gtt 800 units/hr - HL 0.53 (correlating with aPTT)  Nephro: SCr 1.23; s/p CRRT (off Sun, reassess need Mon by renal)  Heme: H&H 6.8/20.9, Plt 332  Goal of Therapy:  Heparin level 0.3-0.7 units/ml aPTT 66 - 102 s seconds Monitor platelets by anticoagulation protocol: Yes   Plan:  Continue heparin 800 units/hr Daily HL, CBC  Isaac Bliss, PharmD, BCPS, [facility] Clinical Pharmacist Pager [phone] 11/19/2015 9:07 AM

## 2015-11-19 NOTE — Progress Notes (Signed)
  Echocardiogram 2D Echocardiogram has been performed.  Delcie Roch 11/19/2015, 2:58 PM

## 2015-11-20 DIAGNOSIS — R001 Bradycardia, unspecified: Secondary | ICD-10-CM | POA: Diagnosis present

## 2015-11-20 DIAGNOSIS — J189 Pneumonia, unspecified organism: Secondary | ICD-10-CM

## 2015-11-20 DIAGNOSIS — N171 Acute kidney failure with acute cortical necrosis: Secondary | ICD-10-CM

## 2015-11-20 LAB — TYPE AND SCREEN
ABO/RH(D): O POS
Antibody Screen: NEGATIVE
Unit division: 0

## 2015-11-20 LAB — BASIC METABOLIC PANEL
Anion gap: 18 — ABNORMAL HIGH (ref 5–15)
BUN: 7 mg/dL (ref 6–20)
CALCIUM: 8.5 mg/dL — AB (ref 8.9–10.3)
CO2: 25 mmol/L (ref 22–32)
CREATININE: 1.11 mg/dL — AB (ref 0.44–1.00)
Chloride: 99 mmol/L — ABNORMAL LOW (ref 101–111)
GFR calc Af Amer: 54 mL/min — ABNORMAL LOW (ref >60)
GFR calc non Af Amer: 46 mL/min — ABNORMAL LOW (ref >60)
Glucose, Bld: 129 mg/dL — ABNORMAL HIGH (ref 65–99)
Potassium: 3.8 mmol/L (ref 3.5–5.1)
SODIUM: 142 mmol/L (ref 135–145)

## 2015-11-20 LAB — CBC
HCT: 23.1 % — ABNORMAL LOW (ref 36.0–46.0)
HEMATOCRIT: 25 % — AB (ref 36.0–46.0)
HEMOGLOBIN: 8 g/dL — AB (ref 12.0–15.0)
Hemoglobin: 7.4 g/dL — ABNORMAL LOW (ref 12.0–15.0)
MCH: 29.1 pg (ref 26.0–34.0)
MCH: 29.2 pg (ref 26.0–34.0)
MCHC: 32 g/dL (ref 30.0–36.0)
MCHC: 32 g/dL (ref 30.0–36.0)
MCV: 90.9 fL (ref 78.0–100.0)
MCV: 91.2 fL (ref 78.0–100.0)
Platelets: 293 10*3/uL (ref 150–400)
Platelets: 316 10*3/uL (ref 150–400)
RBC: 2.54 MIL/uL — ABNORMAL LOW (ref 3.87–5.11)
RBC: 2.74 MIL/uL — ABNORMAL LOW (ref 3.87–5.11)
RDW: 16.7 % — AB (ref 11.5–15.5)
RDW: 16.8 % — AB (ref 11.5–15.5)
WBC: 10.4 10*3/uL (ref 4.0–10.5)
WBC: 12.5 10*3/uL — ABNORMAL HIGH (ref 4.0–10.5)

## 2015-11-20 LAB — GLUCOSE, CAPILLARY
GLUCOSE-CAPILLARY: 107 mg/dL — AB (ref 65–99)
GLUCOSE-CAPILLARY: 122 mg/dL — AB (ref 65–99)
GLUCOSE-CAPILLARY: 122 mg/dL — AB (ref 65–99)
Glucose-Capillary: 134 mg/dL — ABNORMAL HIGH (ref 65–99)
Glucose-Capillary: 161 mg/dL — ABNORMAL HIGH (ref 65–99)

## 2015-11-20 LAB — APTT: APTT: 39 s — AB (ref 24–37)

## 2015-11-20 NOTE — Progress Notes (Signed)
Hospital Problem List     Active Problems:   Sepsis (HCC)   AKI (acute kidney injury) (HCC)   Metabolic acidosis   Hypocalcemia   Acute respiratory failure with hypoxia (HCC)   Pressure ulcer   Acute renal failure (ARF) (HCC)   HCAP (healthcare-associated pneumonia)     Patient Profile:   Primary Cardiologist: Dr. Mariah Milling  81 y.o. female w/ PMH of PAF (on Eliquis PTA), CAD (s/p PCI of RCA in 2000, 2005 secondary to ISR), chronic diastolic CHF (EF 14-48% by echo in 07/2015), Stage 3 CKD, COPD, HTN, and HLD who presented to [facility] on 11/16/2015 as a transfer from [facility]. Cards consulted for bradycardia.  Subjective   Denies any chest pain or palpitations. Says she is cold. No other complaints this AM.   Inpatient Medications    . antiseptic oral rinse  7 mL Mouth Rinse q12n4p  . atorvastatin  20 mg Oral q1800  . chlorhexidine  15 mL Mouth Rinse BID  . insulin aspart  0-9 Units Subcutaneous TID WC  . levothyroxine  150 mcg Oral QAC breakfast  . meropenem (MERREM) IV  1 g Intravenous Q12H  . pantoprazole  40 mg Oral Q1200  . sertraline  50 mg Oral Daily    Vital Signs    Filed Vitals:   11/20/15 0500 11/20/15 0600 11/20/15 0700 11/20/15 0733  BP:  110/72 129/46   Pulse: 69 65 68   Temp:    98.8 F (37.1 C)  TempSrc:   Oral Oral  Resp: 17 15 13    Height:      Weight:      SpO2: 99% 94% 92%     Intake/Output Summary (Last 24 hours) at 11/20/15 1309 Last data filed at 11/20/15 0600  Gross per 24 hour  Intake    360 ml  Output   1545 ml  Net  -1185 ml   Filed Weights   11/18/15 0500 11/19/15 0500 11/20/15 0300  Weight: 247 lb 9.2 oz (112.3 kg) 243 lb 9.7 oz (110.5 kg) 239 lb 6.7 oz (108.6 kg)    Physical Exam    General: Obese Casucasian  female appearing in no acute distress. Head: Normocephalic, atraumatic.  Neck: Supple without bruits, JVD difficult to assess secondary to body habitus. Lungs:  Resp regular and unlabored, decreased  breath sounds at bases bilaterally. Heart: RRR, S1, S2, no S3, S4, or murmur; no rub. Abdomen: Soft, non-tender, non-distended with normoactive bowel sounds. No hepatomegaly. No rebound/guarding. No obvious abdominal masses. Extremities: No clubbing or cyanosis, trace edema bilaterally. Distal pedal pulses are 2+ bilaterally. Wound Vac in place. Neuro: Alert and oriented X 3. Moves all extremities spontaneously. Psych: Normal affect.  Labs    CBC  Recent Labs  11/18/15 0501  11/19/15 2000 11/20/15 0405  WBC 17.4*  < > 11.6* 10.4  NEUTROABS 14.1*  --   --   --   HGB 7.3*  < > 7.7* 7.4*  HCT 22.4*  < > 23.8* 23.1*  MCV 87.2  < > 89.5 90.9  PLT 413*  < > 279 293  < > = values in this interval not displayed. Basic Metabolic Panel  Recent Labs  11/18/15 0501  11/18/15 1612 11/19/15 0445 11/20/15 0405  NA  --   < > 134* 135 142  K  --   < > 3.6 3.8 3.8  CL  --   < > 103 102 99*  CO2  --   < >  24 26 25   GLUCOSE  --   < > 69 131* 129*  BUN  --   < > 22* 12 7  CREATININE  --   < > 2.05* 1.23* 1.11*  CALCIUM  --   < > 6.7* 7.0* 8.5*  MG 1.3*  --   --  2.0  --   PHOS  --   < > 3.4 2.4*  --   < > = values in this interval not displayed. Liver Function Tests  Recent Labs  11/18/15 0502 11/18/15 1612  ALBUMIN 1.6* 1.5*     Telemetry    NSR, HR in 60's - 70's.   ECG    No new tracings.   Cardiac Studies and Radiology    01/19/16 Renal  11/17/2015  CLINICAL DATA:  Acute renal failure EXAM: RENAL / URINARY TRACT ULTRASOUND COMPLETE COMPARISON:  11/15/2015 FINDINGS: Right Kidney: Length: 11.5 cm. Echogenicity within normal limits. No mass or hydronephrosis visualized. Left Kidney: Length: 10.7 cm. Echogenicity within normal limits. No mass or hydronephrosis visualized. Bladder: Decompressed with Foley catheter in place IMPRESSION: No acute findings.  No hydronephrosis. Electronically Signed   By: 11/17/2015 M.D.   On: 11/17/2015 12:56   Dg Chest Port 1 View  11/18/2015   CLINICAL DATA:  81 year old female with renal failure EXAM: PORTABLE CHEST 1 VIEW COMPARISON:  Chest radiograph dated 11/17/2015 FINDINGS: Right IJ central line with tip who in stable positioning. Left IJ dialysis catheter terminates at cavoatrial junction. The patient is rotated to the left. There is stable cardiomegaly with central vascular and interstitial prominence compatible with congestive changes. Left lung base opacity may represent atelectatic changes. Pneumonia is not excluded. Clinical correlation is recommended. Small left pleural effusion may be present. There is osteopenia with degenerative changes of the spine. No acute fracture. IMPRESSION: Cardiomegaly with congestive changes, new from prior study. Left lung base atelectasis versus infiltrate. Electronically Signed   By: 11/19/2015 M.D.   On: 11/18/2015 00:37   Dg Chest Port 1 View  11/17/2015  CLINICAL DATA:  Respiratory failure, sepsis, CHF. EXAM: PORTABLE CHEST 1 VIEW COMPARISON:  Portable chest x-ray dated November 14, 2015 FINDINGS: The lungs are adequately inflated. The interstitial markings are mildly increased. The pulmonary vascularity is prominent. The cardial pericardial silhouette is enlarged. There is partial obscuration of the left hemidiaphragm. The right internal jugular venous catheter tip projects over the midportion of the SVC. IMPRESSION: Allowing for differences in radiographic technique there has been mild interval deterioration in the pulmonary interstitium suggesting increasing interstitial edema. Stable cardiomegaly. Slight increased atelectasis or infiltrate at the left lung base. Electronically Signed   By: David  November 16, 2015 M.D.   On: 11/17/2015 07:23    Echocardiogram: 11/19/2015 Study Conclusions  - Left ventricle: The cavity size was normal. Wall thickness was  increased in a pattern of mild LVH. Systolic function was normal.  The estimated ejection fraction was in the range of 60% to 65%.  Wall motion was  normal; there were no regional wall motion  abnormalities. The study is not technically sufficient to allow  evaluation of LV diastolic function. The E/e&' ratio is >20,  suggesting markedly elevated LV filling pressure. - Aortic valve: Mildly calcified leaflets. Mild stenosis. There was  trivial regurgitation. Mean gradient (S): 10 mm Hg. Peak gradient  (S): 17 mm Hg. Valve area (VTI): 1.9 cm^2. Valve area (Vmax):  1.91 cm^2. Valve area (Vmean): 1.86 cm^2. - Mitral valve: Calcified annulus. Mildly thickened leaflets .  There was mild regurgitation. - Left atrium: Moderately dilated. - Right atrium: The atrium was mildly dilated. - Tricuspid valve: There was moderate regurgitation. - Pulmonary arteries: Dilated. PA peak pressure: 57 mm Hg (S). - Inferior vena cava: The vessel was dilated. The respirophasic  diameter changes were blunted (< 50%), consistent with elevated  central venous pressure. - Pericardium, extracardiac: There was no pericardial effusion.  Impressions:  - Compared to the prior study in 07/2015, the LVEF is higher at  60-65%. There may be mild aortic stenosis. The mitral annulus is  calcified. Left and right heart filling pressures are higher in  this study.  Assessment & Plan    1. Sinus Bradycardia/ Paroxysmal Atrial Fibrillation - has a history of PAF. Was on Amiodarone 200mg  daily and Eliquis prior to admission to [facility]. While there, she was noted to be bradycardiac with HR into the mid-20's at times, requiring initiation of an Isuprel drip. Eliquis held in setting of sepsis and AKI. Amiodarone stopped in setting of bradycardia and hypotension. Notes mention a BB being stopped, but I do not see where she was on a BB PTA. At her most recent Cardiology office visit in 07/2015, she was not on a BB.  - This patients CHA2DS2-VASc Score and unadjusted Ischemic Stroke Rate (% per year) is equal to 12.2 % stroke rate/year from a score of 9 (CHF,  HTN, DM, Vascular, Female, Age (2), CVA(2)). Continue Heparin per pharmacy consult. - while at [facility], her initial EKG was read as a junctional rhythm with HR of 54. It does appear occasional p-waves are noted, therefore more consistent with sinus bradycardia.  - on telemetry, she is continuing to maintain NSR, HR in 60's - 70's. Would anticipate restarting Amiodarone prior to discharge. No BB, as she was not on this PTA (stopped prior to her office visit in 07/2015).   2. Chronic Diastolic CHF - recent echo in 08/2015 showing a preserved EF of 55-65% with no wall motion abnormalities. Repeat echo this admission with EF of 60-65%. Mild AS noted.  - received IVF in the setting of sepsis and hypotension.Underwent CRRT by Nephrology, now net -2.2L this admission. - PTA Torsemide, ACE-I, and BB held.   3. Acute on Chronic Stage 3 CKD - creatinine 4.54 on admission (was 1.09 four weeks ago).  - started on CRRT, creatinine now improved to 1.11. - Nephrology following.   4. Sepsis - per admitting team  Signed, [id number] , PA-C 1:09 PM 11/20/2015 Pager: [phone] Patient seen and examined and history reviewed. Agree with above findings and plan. Awake, alert, no complaints. Telemetry reviewed. No pauses or bradycardia. I suspect admitting bradycardia related to sepsis and acute illness. Now resolved. I would recommend resuming amiodarone at discharge 200 mg daily. Will sign off from cardiac standpoint. Please call with questions.  Justina Bertini [phone], MDFACC 11/20/2015 2:03 PM

## 2015-11-20 NOTE — Progress Notes (Addendum)
[facility] Progress Note    Subjective:    CRRT discontinued yesterday (7/2) around noon Has had about a liter of UOP/24 hours Getting wound VAC change right now. Has 2 hemovacs also Required 1 unit of blood during the night fro Hb 6.8 Answers questions but remains confused   Objective:   BP 129/46 mmHg  Pulse 68  Temp(Src) 98.8 F (37.1 C) (Oral)  Resp 13  Ht 5\' 2"  (1.575 m)  Wt 108.6 kg (239 lb 6.7 oz)  BMI 43.78 kg/m2  SpO2 92%  Intake/Output Summary (Last 24 hours) at 11/20/15 1015 Last data filed at 11/20/15 0600  Gross per 24 hour  Intake    518 ml  Output   1759 ml  Net  -1241 ml   Weight change: -1.9 kg (-4 lb 3 oz)  Physical Exam: Pale appearing MO WF BP 129/46 mmHg  Pulse 68  Temp(Src) 98.8 F (37.1 C) (Oral)  Resp 13  Ht 5\' 2"  (1.575 m)  Wt 108.6 kg (239 lb 6.7 oz)  BMI 43.78 kg/m2  SpO2 92%  VS as noted Left IJ temp cath in place 6/30) Foley Wound VAC 2 hemovacs R hip Oriented to place, year "2002", day of the week "2002"  Anteriorly fairly clear Regular rhythm S1S2 normal No S3 Heart sounds distant, can't hear murmur Abdomen obese Legs immobilized in foam immobilizer Long incision right hip, wound VAC currently being applied. 2 hemovacs upper and lower ends wound No pretibial edema/hard to tell about thighs  2003 Renal  11/17/2015  CLINICAL DATA:  Acute renal failure EXAM: RENAL / URINARY TRACT ULTRASOUND COMPLETE COMPARISON:  11/15/2015 FINDINGS: Right Kidney: Length: 11.5 cm. Echogenicity within normal limits. No mass or hydronephrosis visualized. Left Kidney: Length: 10.7 cm. Echogenicity within normal limits. No mass or hydronephrosis visualized. Bladder: Decompressed with Foley catheter in place IMPRESSION: No acute findings.  No hydronephrosis. Electronically Signed   By: 11/19/2015 M.D.   On: 11/17/2015 12:56   Dg Chest Port 1 View  11/18/2015  CLINICAL DATA:  81 year old female with renal failure EXAM: PORTABLE  CHEST 1 VIEW COMPARISON:  Chest radiograph dated 11/17/2015 FINDINGS: Right IJ central line with tip who in stable positioning. Left IJ dialysis catheter terminates at cavoatrial junction. The patient is rotated to the left. There is stable cardiomegaly with central vascular and interstitial prominence compatible with congestive changes. Left lung base opacity may represent atelectatic changes. Pneumonia is not excluded. Clinical correlation is recommended. Small left pleural effusion may be present. There is osteopenia with degenerative changes of the spine. No acute fracture. IMPRESSION: Cardiomegaly with congestive changes, new from prior study. Left lung base atelectasis versus infiltrate. Electronically Signed   By: 66 M.D.   On: 11/18/2015 00:37   Dg Chest Port 1 View  11/17/2015  CLINICAL DATA:  Respiratory failure, sepsis, CHF. EXAM: PORTABLE CHEST 1 VIEW COMPARISON:  Portable chest x-ray dated November 14, 2015 FINDINGS: The lungs are adequately inflated. The interstitial markings are mildly increased. The pulmonary vascularity is prominent. The cardial pericardial silhouette is enlarged. There is partial obscuration of the left hemidiaphragm. The right internal jugular venous catheter tip projects over the midportion of the SVC. IMPRESSION: Allowing for differences in radiographic technique there has been mild interval deterioration in the pulmonary interstitium suggesting increasing interstitial edema. Stable cardiomegaly. Slight increased atelectasis or infiltrate at the left lung base. Electronically Signed   By: David  11/19/2015 M.D.   On: 11/17/2015 07:23   Labs:  Recent Labs Lab 11/16/15 2325 11/17/15 0500 11/17/15 1620 11/18/15 0502 11/18/15 1612 11/19/15 0445 11/20/15 0405  NA 127* 126* 128* 130* 134* 135 142  K 5.3* 5.3* 5.4* 4.1 3.6 3.8 3.8  CL 103 97* 98* 99* 103 102 99*  CO2 17* 19* 21* 23 24 26 25   GLUCOSE 277* 206* 97 125* 69 131* 129*  BUN 45* 44* 46* 37* 22* 12  7  CREATININE 4.51* 4.54* 4.68* 3.55* 2.05* 1.23* 1.11*  CALCIUM 6.8* 6.9* 6.8* 6.6* 6.7* 7.0* 8.5*  PHOS 5.7*  --   --  4.7* 3.4 2.4*  --    CBC  Recent Labs Lab 11/16/15 2325  11/18/15 0501 11/19/15 0445 11/19/15 1000 11/19/15 2000 11/20/15 0405  WBC 16.7*  < > 17.4* 12.6* 13.1* 11.6* 10.4  NEUTROABS 15.5*  --  14.1*  --   --   --   --   HGB 8.9*  < > 7.3* 6.8* 8.1* 7.7* 7.4*  HCT 27.2*  < > 22.4* 20.9* 24.5* 23.8* 23.1*  MCV 88.3  < > 87.2 89.3 89.1 89.5 90.9  PLT 344  < > 413* 332 332 279 293  < > = values in this interval not displayed.  Medications:    . antiseptic oral rinse  7 mL Mouth Rinse q12n4p  . atorvastatin  20 mg Oral q1800  . chlorhexidine  15 mL Mouth Rinse BID  . insulin aspart  0-9 Units Subcutaneous TID WC  . levothyroxine  150 mcg Oral QAC breakfast  . meropenem (MERREM) IV  1 g Intravenous Q12H  . pantoprazole  40 mg Oral Q1200  . sertraline  50 mg Oral Daily     Background 82 y.o. female with a history of DM, PAF, CAD, prior stents, CHF, OA, hypothyroidism. Baseline creatinine  1.1-1.3. Recent right hip hemiarthroplasty ~10/27/15 complicated by surgical site infection. 6/27 I&D of the right hip with implantation of spacer and wound vac + initiation broad spectrum Abx (Vanc + Zosyn). Also bradycardia with rates in the 20's thought by cardiology to be secondary to amiodarone and metoprolol -->req'd initiation of Isoproterenol. Transferred to [facility] from [facility]  6/29 for sepsis. Oliguric, creatinine up to 4.54, hyperkalemia, metabolic acidosis. Required CRRT 6/30-7/2.  Drop in Hb overnight 7/2 required PRBC's.  Assessment/ Plan:   1. Acute kidney injury on CKD III with a baseline Cr ~1.1-1.3. Most likely dx ATN (sepsis + likelyhypotension during periods of severe bradycardia). Uncertain if received contrast at [facility]. Ultrasound --> no hydro as expected w/ foley in place; nl sized kidneys.  UA many WBC's (poss d/t indwelling foley). CRRT 6/30-7/2.   Hyperkalemia/hyponatremia both  resolved w/CRRT. Weight dropped from max of 112.9 to 108.6. UOP off CRRT 1.16 liters/24 hours. BP stable. Creatinine 1.1 probably not yet interpretable as CRRT just stopped yesterday but am optimistic for recovery - will continue to monitor off dialysis  2. Sepsis - most likely due to the right hip with recent I&D on 11/14/15 --> on meropenem. WBC normalizing 3. Bradycardia - appears to have resolved holding the offending medications. 4. Right hip arthroplasty (6/9)  and I&D w/ revision (6/27). Per ortho 5. Anemia - 1 unit PRBC's 7/2 for Hb 6.8 - 7.4 this AM. I suspect in R hip (hemovacs draining 200-300 cc/day) 6. Hypothyroidism - on levo 7. DM  10-[birth year]1981, MD [facility] [phone] Pager 11/20/2015, 10:25 AM

## 2015-11-20 NOTE — Care Management Important Message (Signed)
Important Message  Patient Details  Name: [name] [name] MRN: [id number] Date of Birth: November 06, 1934   Medicare Important Message Given:  Yes    Samual Beals [place] 11/20/2015, 10:26 AM

## 2015-11-20 NOTE — Consult Note (Signed)
WOC wound consult note Reason for Consult: NPWT VAC dressing.  Patient from [place], unclear but it would appear not changed on Friday. Ortho new orders to change dressing and pull HV drains. However bedside nurse reports drains still are having increased output and VAC canister also with moderate amounts of serosanguinous drainage.  Wound type: surgical  Measurement: 24cm with sutures, approximated Wound bed: no open wound Drainage (amount, consistency, odor) moderate in canister and collection under VAC drape at proximal drain site Periwound: edema  Dressing procedure/placement/frequency: Removed VAC dressing very carefully to not disrupt hemovac drain, moderate clot found under the dressing the exit site of the drain.  Cleaned incision and around drain sites with chloroprep.   Cover drain sites with gauze and VAC drape.  1pc of Mepitel used to cover the incision and 1pc of black foam used to provide incisional NPWT.  Will change weekly until drainage is down.  Will reeval HV drainage at the time of the next dressing change on Monday.  WOC will follow along for weekly incisional VAC dressing changes and re-eval for HV removal at the next dressing change.  [name] [place] RN,CWOCN [phone]

## 2015-11-20 NOTE — Progress Notes (Signed)
PULMONARY / CRITICAL CARE MEDICINE   Name: [name] [name] MRN: [id number] DOB: 1934-10-07    ADMISSION DATE:  11/16/2015 CONSULTATION DATE:  11/16/15  REFERRING MD:  [facility]  CHIEF COMPLAINT:  Sepsis  HISTORY OF PRESENT ILLNESS:  Pt is encephelopathic; therefore, this HPI is obtained from chart review. [name] [name] is a 81 y.o. female with PMH as outlined below including recent right hip hemiarthroplasty roughly 10/27/15 complicated by infected surgical site.  She was admitted again to [facility] on 6/27 and underwent I&D of the right hip with implantation of spacer and wound vac and was started on empiric vanc / zosyn.  In addition, she had severe bradycardia with HR in the 20's.  She was seen in consultation by cardiology who felt that bradycardia was related to amiodarone and metoprolol; therefore, these were discontinued and pt was started on Isuprel.  Her hospital stay was complicated by anemia for which she received 4u PRBC transfusion along with worsening renal failure. She was transferred to [facility] 6/29 for further evaluation and management.  SUBJECTIVE:  No acute events overnight.   Weaned off dopamine 7/1. CRRT stopped yesterday per renal.  Awake and alert this am . Feeling some better. Dry cough is decreased. Only oriented to person this AM, nurse states she is sometimes disoriented in the mornings.    VITAL SIGNS: BP 129/46 mmHg  Pulse 68  Temp(Src) 98.8 F (37.1 C) (Oral)  Resp 13  Ht 5\' 2"  (1.575 m)  Wt 239 lb 6.7 oz (108.6 kg)  BMI 43.78 kg/m2  SpO2 92%  HEMODYNAMICS:    VENTILATOR SETTINGS:    INTAKE / OUTPUT: I/O last 3 completed shifts: In: 1989.2 [P.O.:60; I.V.:1161.7; Blood:367.5; IV Piggyback:400] Out: 5672 [Urine:1860; Drains:340; 04-19-1990; Stool:650]   PHYSICAL EXAMINATION: General: Obese female. No distress.  Neuro: Awake. Follows commands. Moving all distal 4 extremities equally. Oriented to person only HEENT:  No scleral icterus or  injection. Dry mucous membranes Cardiovascular: Normal rate and rhyhm . 2+ DP pulses bilaterally Lungs: Normal work of breathing  Distant breath sounds bilaterally. Abdomen: Soft. Protuberant. Nontender. Musculoskeletal: Wound VAC in place over right hip. No other joint deformity appreciated. Skin: Warm and dry. No rash on exposed skin.  LABS:  BMET  Recent Labs Lab 11/18/15 1612 11/19/15 0445 11/20/15 0405  NA 134* 135 142  K 3.6 3.8 3.8  CL 103 102 99*  CO2 24 26 25   BUN 22* 12 7  CREATININE 2.05* 1.23* 1.11*  GLUCOSE 69 131* 129*    Electrolytes  Recent Labs Lab 11/16/15 2325  11/18/15 0501 11/18/15 0502 11/18/15 1612 11/19/15 0445 11/20/15 0405  CALCIUM 6.8*  < >  --  6.6* 6.7* 7.0* 8.5*  MG 1.1*  --  1.3*  --   --  2.0  --   PHOS 5.7*  --   --  4.7* 3.4 2.4*  --   < > = values in this interval not displayed.  CBC  Recent Labs Lab 11/19/15 1000 11/19/15 2000 11/20/15 0405  WBC 13.1* 11.6* 10.4  HGB 8.1* 7.7* 7.4*  HCT 24.5* 23.8* 23.1*  PLT 332 279 293    Coag's  Recent Labs Lab 11/16/15 2325  11/18/15 2100 11/19/15 0810 11/20/15 0405  APTT  --   < > 129* 94* 39*  INR 1.36  --   --   --   --   < > = values in this interval not displayed.  Sepsis Markers  Recent Labs Lab 11/16/15  2325 11/17/15 0049 11/17/15 0500 11/18/15 0501  LATICACIDVEN 2.3*  --   --   --   PROCALCITON  --  0.60 0.64 0.48    ABG  Recent Labs Lab 11/16/15 2312 11/16/15 2356  PHART 7.268* 7.315*  PCO2ART 32.8* 31.3*  PO2ART 103.0* 119.0*    Liver Enzymes  Recent Labs Lab 11/16/15 2325 11/18/15 0502 11/18/15 1612  AST 15  --   --   ALT 8*  --   --   ALKPHOS 108  --   --   BILITOT 0.5  --   --   ALBUMIN 1.5* 1.6* 1.5*    Cardiac Enzymes  Recent Labs Lab 11/16/15 2325  TROPONINI 0.06*    Glucose  Recent Labs Lab 11/19/15 1214 11/19/15 1613 11/19/15 2020 11/20/15 0022 11/20/15 0359 11/20/15 0730  GLUCAP 121* 126* 121* 122* 122*  134*    Imaging No results found.   STUDIES:  TTE 07/20/15:Atrial fibrillation. LVEF 55-60%. Normal wall motion. Unable to assess diastolic function. Mild mitral regurgitation. RV normal in size and function. Pulmonary artery systolic pressure 38 mmHg. EKG 6/29:  Junctional bradycardia. Port CXR 6/30:  Right hilar fullness with silhouetting of the left hemidiaphragm suggestive of retrocardiac opacity. Globular heart with suggestion of cardiomegaly. TTE on 7/2: Compared to the prior study in 07/2015, the LVEF is higher at 60-65%. There may be mild aortic stenosis. The mitral annulus is calcified. Left and right heart filling pressures are higher in this study  MICROBIOLOGY [facility]): Blood Ctx x2 6/29>> R Hip Ctx 6/29>> (poss Staph aureus & 2 GNR)>Staph Aureus(MSSA) , Enterobacter , Klebsiella PNA   ANTIBIOTICS: Vancomycin 06/27>>7/1 Zozyn 06/27>> 7/1 Merrem 7/1 >>  SIGNIFICANT EVENTS: 06/09 - right hip hemiarthroplasty 06/27 - admitted with infected right hip to [facility]  Taken to OR for I&D and spacer implantation. 06/29 - Worsened renal function.  Ttransferred to [facility] for further evaluation and management. 7/1 CRRT started  7/2 CRRT stopped.  7/3: Stable for SDU transfer  LINES/TUBES: R IJ CVL [facility]) > L radial A line [facility]) > FOLEY [facility]) >  ASSESSMENT / PLAN:  INFECTIOUS A:   Sepsis - Due to infected right hip; s/p I&D 11/14/15. R Hip Infection-MSSA, Enterobacter, Klebsiella  Possible LLL HCAP  P:   Vanc and Zosyn off 7/1 Merrem day #3  Clinically improved.   CARDIOVASCULAR A:  Bradycardia - Reportedly 3rd degree AVB at [facility]. H/O PAF - On outpt Eliquis. H/O HTN H/O CAD H/O HLD Currently normal sinus rhythm   P:  Goal MAP > 65 & SBP > 90 Dopamine gtt off 7/1  IV Heparin stopped yesterday 2/2 anemia.  Plan to hold off on heparin for now as Hb and Hct still low.  Will hold off on transfusing unless Hb < 7.  Continuing outpt  Lipitor Hold outpatient amiodarone, amlodipine & lisinopril Cardiology following; restart amiodarone when more stable TTE on 7/2: Compared to the prior study in 07/2015, the LVEF is higher at 60-65%. There may be mild aortic stenosis. The mitral annulus is calcified. Left and right heart filling pressures are higher in this study.  RENAL A:   Acute Renal Failure>no hydronephrosis on renal US , CRRT started 7/1 and ended on 7/2  NAGMA (normal anion gap metabolic acidosis). AG now 18 Hyponatremia -resolved  Hypomagnesium -resolved  7/1: Pseudo-Hypocalcemia - S/P 1gm Calcium Gluconate. Corrected w/ Albumin to 8.9. 7/3: Cr back to baseline at 1.11  P:   Trending UOP with Foley  Monitoring  electrolytes & renal function daily CRRT stopped on  7/2 .  Replacing electrolytes as indicated Nephrology following .   PULMONARY A: Acute Hypoxic Respiratory Failure-improving. On 2 L [place] now with O2 sats > 92% Possible LLL HCAP  P:   Weaning FiO2 for Sat >92% Continuous Pulse Ox BiPAP prn work of breathing  GASTROINTESTINAL A:   H/O GERD Morbid obesity  P:   Cont diet Protonix PO daily  HEMATOLOGIC A:   Leukocytosis - improved  Anemia - No signs of active bleeding. Hgb trending down s/p 1 u PRBC 7/2. hgb 6.8 before transfusion and 8.1 after. At 2200 it was 7.7 and this AM it is 7.4.  Chronic Anticoagulation - Eliquis as outpatient.  P:  Trending cell counts daily w/ CBC Holding Heparin gtt for now as Hb/Hct still relatively low. Plan for cbc at 6 pm. Transfuse if Hb < 7.  Holding Eliquis for now 2/2 anemia  ENDOCRINE A:   H/O DM Type 2- Glucose ranges 121-134 H/O Hypothyroidism - TSH 1.861.  P:   Accu-Checks tidac/qhs  Cont  Synthroid  Hold outpatient Lantus & Metformin.  NEUROLOGIC A:   H/O Anxiety/Depression H/O Neurogenic Bladder - Chronic foley placed at [facility].  P:   Monitor mental status closely for withdrawal Hold outpatient alprazolam,  gabapentin Continue OP zoloft   ORTHOPEDICS A: Septic Right Hip - S/P I/D 6/27 w/ spacer & wound vac. R Fem Fracture - S/P R Hip Hemiarthroplasty 6/9.   P: Orthopedics following  Fentanyl IV prn severe pain  Wound Care C/S for Wound Vac    Family updated: No family available.  Interdisciplinary Family Meeting v Palliative Care Meeting:  Due by: 11/22/15.   TODAY'S SUMMARY:  81 y.o. F transferred from [place] where she was admitted 6/27 due to infected right hip (right hip arthroplasty performed 6/9).  Patient with resolving bradycardia from junctional rhythm and now normal rate and rhythm. Weaned off dopamine 7/1 . Orthopedics following  given septic joint with wound VAC and spacer in place. Nephrology following for ongoing renal failure. UOP good, scr now at baseline . CRRT stopped yesterday. Has anemia for which heparin drip has been dc'd.  Pt has been stable in ICU off CRRT since 7/2. Plan for transfer to SDU today and will be on [facility] service starting 7/4.  PCCM will sign off starting 7/4.  I discussed case with Dr. Sharon Seller.   Anders Simmonds, MD [facility], PGY-2   11/20/2015, 8:47 AM   ATTENDING NOTE / ATTESTATION NOTE :   I have discussed the case with the resident/APP  Dr. Anders Simmonds  I agree with the resident/APP's  history, physical examination, assessment, and plans.    I have edited the above note and modified it according to our agreed history, physical examination, assessment and plan. Transfer to SDU today. Transfer to [facility] in am. PCCM will sign off on 7/4 unless with issues. D/w Dr. Sharon Seller.   Family  No family at bedside.    Pollie Meyer, MD 11/20/2015, 12:30 PM [facility] Pager [phone] After 3 pm or if no answer, call [phone]

## 2015-11-21 DIAGNOSIS — A419 Sepsis, unspecified organism: Secondary | ICD-10-CM | POA: Insufficient documentation

## 2015-11-21 DIAGNOSIS — N319 Neuromuscular dysfunction of bladder, unspecified: Secondary | ICD-10-CM | POA: Diagnosis present

## 2015-11-21 DIAGNOSIS — T814XXA Infection following a procedure, initial encounter: Secondary | ICD-10-CM

## 2015-11-21 DIAGNOSIS — I480 Paroxysmal atrial fibrillation: Secondary | ICD-10-CM | POA: Diagnosis present

## 2015-11-21 DIAGNOSIS — A498 Other bacterial infections of unspecified site: Secondary | ICD-10-CM | POA: Insufficient documentation

## 2015-11-21 DIAGNOSIS — D62 Acute posthemorrhagic anemia: Secondary | ICD-10-CM | POA: Diagnosis present

## 2015-11-21 DIAGNOSIS — B961 Klebsiella pneumoniae [K. pneumoniae] as the cause of diseases classified elsewhere: Secondary | ICD-10-CM

## 2015-11-21 DIAGNOSIS — T8149XA Infection following a procedure, other surgical site, initial encounter: Secondary | ICD-10-CM | POA: Diagnosis present

## 2015-11-21 DIAGNOSIS — A4901 Methicillin susceptible Staphylococcus aureus infection, unspecified site: Secondary | ICD-10-CM | POA: Diagnosis present

## 2015-11-21 LAB — GLUCOSE, CAPILLARY
GLUCOSE-CAPILLARY: 164 mg/dL — AB (ref 65–99)
GLUCOSE-CAPILLARY: 173 mg/dL — AB (ref 65–99)
GLUCOSE-CAPILLARY: 170 mg/dL — AB (ref 65–99)
Glucose-Capillary: 146 mg/dL — ABNORMAL HIGH (ref 65–99)
Glucose-Capillary: 201 mg/dL — ABNORMAL HIGH (ref 65–99)

## 2015-11-21 LAB — CBC
HEMATOCRIT: 25 % — AB (ref 36.0–46.0)
Hemoglobin: 7.9 g/dL — ABNORMAL LOW (ref 12.0–15.0)
MCH: 29 pg (ref 26.0–34.0)
MCHC: 31.6 g/dL (ref 30.0–36.0)
MCV: 91.9 fL (ref 78.0–100.0)
Platelets: 321 10*3/uL (ref 150–400)
RBC: 2.72 MIL/uL — AB (ref 3.87–5.11)
RDW: 17 % — AB (ref 11.5–15.5)
WBC: 12.7 10*3/uL — AB (ref 4.0–10.5)

## 2015-11-21 LAB — RENAL FUNCTION PANEL
ALBUMIN: 1.7 g/dL — AB (ref 3.5–5.0)
ANION GAP: 9 (ref 5–15)
BUN: 8 mg/dL (ref 6–20)
CHLORIDE: 107 mmol/L (ref 101–111)
CO2: 23 mmol/L (ref 22–32)
Calcium: 7.9 mg/dL — ABNORMAL LOW (ref 8.9–10.3)
Creatinine, Ser: 1.17 mg/dL — ABNORMAL HIGH (ref 0.44–1.00)
GFR, EST AFRICAN AMERICAN: 50 mL/min — AB (ref >60)
GFR, EST NON AFRICAN AMERICAN: 43 mL/min — AB (ref >60)
Glucose, Bld: 135 mg/dL — ABNORMAL HIGH (ref 65–99)
PHOSPHORUS: 2.4 mg/dL — AB (ref 2.5–4.6)
POTASSIUM: 4 mmol/L (ref 3.5–5.1)
Sodium: 139 mmol/L (ref 135–145)

## 2015-11-21 LAB — APTT: aPTT: 39 seconds — ABNORMAL HIGH (ref 24–37)

## 2015-11-21 MED ORDER — FUROSEMIDE 40 MG PO TABS
80.0000 mg | ORAL_TABLET | Freq: Every day | ORAL | Status: DC
  Administered 2015-11-21 – 2015-11-27 (×7): 80 mg via ORAL
  Filled 2015-11-21: qty 1
  Filled 2015-11-21: qty 2
  Filled 2015-11-21: qty 1
  Filled 2015-11-21: qty 2
  Filled 2015-11-21: qty 1
  Filled 2015-11-21: qty 2
  Filled 2015-11-21: qty 1

## 2015-11-21 NOTE — Evaluation (Signed)
Physical Therapy Evaluation Patient Details Name: [name] [name] MRN: [id number] DOB: Sep 19, 1934 Today's Date: 11/21/2015   History of Present Illness  81 y.o. female with PMH including recent right hip hemiarthroplasty roughly 10/27/15 (due to hip fracture) complicated by infected surgical site. She was admitted again to [facility] on 6/27 and underwent I&D of the right hip with implantation of spacer and wound vac. Transferred to [facility] 6/30 due to worsening renal failure, bradycardia, hypotension. Initiated CRRT 6/30. Last CRRT 7/2 PMHx-- PAF, CAD, chronic diastolic CHF (EF 37-85% by echo in 07/2015), Stage 3 CKD, COPD, HTN, and HLD    Clinical Impression  Patient is s/p above surgery resulting in functional limitations due to the deficits listed below (see PT Problem List). Limited bed level evaluation due to unknown Rt hip restrictions or weight-bearing status (assumed NWB). Will need orthopedic input re: Rt hip restrictions. Patient will benefit from skilled PT to increase their independence and safety with mobility to allow discharge to the venue listed below.       Follow Up Recommendations SNF;Supervision/Assistance - 24 hour    Equipment Recommendations  Other (comment) (TBA at SNF)    Recommendations for Other Services OT consult     Precautions / Restrictions Precautions Precautions: Other (comment) (unclear; ?posterior) Restrictions RLE Weight Bearing:  (anticipate NWB) Other Position/Activity Restrictions: need orthopedic input re: restrictions for Rt hip      Mobility  Bed Mobility Overal bed mobility: Needs Assistance             General bed mobility comments: nursing had just positioned pt up in bed; requires total assist of 2 for rolling  Transfers                    Ambulation/Gait                Stairs            Wheelchair Mobility    Modified Rankin (Stroke Patients Only)       Balance                                              Pertinent Vitals/Pain SaO2 88-92% on 2L O2; dyspnea 3/4  Pain Assessment: Faces Faces Pain Scale: Hurts even more Pain Location: Rt hip with HOB movement Pain Descriptors / Indicators: Operative site guarding Pain Intervention(s): Limited activity within patient's tolerance;Repositioned    Home Living Family/patient expects to be discharged to:: Skilled nursing facility                      Prior Function Level of Independence: Needs assistance   Gait / Transfers Assistance Needed: at SNF was transferring to chair and walking short distances (per pt--confused)           Hand Dominance   Dominant Hand: Right    Extremity/Trunk Assessment   Upper Extremity Assessment: RUE deficits/detail;LUE deficits/detail RUE Deficits / Details: Shoulder flexion <40 AROM, elbow WNL AROM, 3+ biceps/triceps, hand very edematous     LUE Deficits / Details: shoulder flexion ~130, 3+ biceps/triceps; very edematous   Lower Extremity Assessment: RLE deficits/detail;LLE deficits/detail RLE Deficits / Details: in abduction pillow; able to move toes and dorsiflex LLE Deficits / Details: AAROM (supine) hip/knee ~70; strength 3+  Cervical / Trunk Assessment: Other exceptions  Communication   Communication: No difficulties  Cognition Arousal/Alertness: Awake/alert Behavior During Therapy: Anxious Overall Cognitive Status: No family/caregiver present to determine baseline cognitive functioning Area of Impairment: Orientation;Attention;Following commands;Awareness Orientation Level: Place;Time;Situation Current Attention Level: Sustained Memory: Decreased short-term memory Following Commands: Follows one step commands consistently       General Comments: confused yet following commands    General Comments      Exercises General Exercises - Lower Extremity Ankle Circles/Pumps: AROM;Both;10 reps Heel Slides: AAROM;Left;5 reps      Assessment/Plan     PT Assessment Patient needs continued PT services  PT Diagnosis Difficulty walking;Generalized weakness;Altered mental status;Acute pain   PT Problem List Decreased strength;Decreased range of motion;Decreased activity tolerance;Decreased mobility;Decreased cognition;Decreased knowledge of use of DME;Cardiopulmonary status limiting activity;Impaired sensation;Obesity;Pain;Decreased skin integrity  PT Treatment Interventions DME instruction;Functional mobility training;Therapeutic activities;Therapeutic exercise;Cognitive remediation;Patient/family education   PT Goals (Current goals can be found in the Care Plan section) Acute Rehab PT Goals Patient Stated Goal: unable to state PT Goal Formulation: Patient unable to participate in goal setting Time For Goal Achievement: 12/05/15 Potential to Achieve Goals: Fair    Frequency Min 2X/week   Barriers to discharge        Co-evaluation               End of Session   Activity Tolerance: Patient tolerated treatment well Patient left: in bed;with call bell/phone within reach;with bed alarm set Nurse Communication: Mobility status;Other (comment) (need for ortho input re: restrictions Rt hip)         Time: 6073-7106 PT Time Calculation (min) (ACUTE ONLY): 18 min   Charges:   PT Evaluation $PT Eval Low Complexity: 1 Procedure     PT G Codes:        Oluwakemi Salsberry 11-23-15, 4:03 PM Pager [phone]

## 2015-11-21 NOTE — Progress Notes (Signed)
Subjective: Interval History: has no complaint , knows still swollen.   Objective: Vital signs in last 24 hours: Temp:  [98 F (36.7 C)-98.7 F (37.1 C)] 98.2 F (36.8 C) (07/04 0725) Pulse Rate:  [59-69] 66 (07/04 0725) Resp:  [13-17] 15 (07/04 0725) BP: (123-151)/(48-91) 150/52 mmHg (07/04 0725) SpO2:  [93 %-97 %] 96 % (07/04 0725) Weight:  [48.308 kg (106 lb 8 oz)-110.8 kg (244 lb 4.3 oz)] 110.8 kg (244 lb 4.3 oz) (07/04 0500) Weight change: -60.292 kg (-132 lb 14.7 oz)  Intake/Output from previous day: 07/03 0701 - 07/04 0700 In: 505 [P.O.:220; IV Piggyback:200] Out: 600 [Urine:550; Drains:50] Intake/Output this shift:    General appearance: alert, cooperative, moderately obese, pale and slowed mentation Resp: diminished breath sounds bibasilar and rales bibasilar Cardio: S1, S2 normal and systolic murmur: holosystolic 2/6, blowing at apex GI: obese, pos bs, soft, liver down 6 cm Extremities: Homans sign is negative, no sign of DVT  2-3 +edema, vac on hip Foley, rectal drain  Lab Results:  Recent Labs  11/20/15 1800 11/21/15 0503  WBC 12.5* 12.7*  HGB 8.0* 7.9*  HCT 25.0* 25.0*  PLT 316 321   BMET:  Recent Labs  11/20/15 0405 11/21/15 0503  NA 142 139  K 3.8 4.0  CL 99* 107  CO2 25 23  GLUCOSE 129* 135*  BUN 7 8  CREATININE 1.11* 1.17*  CALCIUM 8.5* 7.9*   No results for input(s): PTH in the last 72 hours. Iron Studies: No results for input(s): IRON, TIBC, TRANSFERRIN, FERRITIN in the last 72 hours.  Studies/Results: No results found.  I have reviewed the patient's current medications.  Assessment/Plan: 1 AKI, CKD3,  Making urine, appears recovering.  Cr rise minimal off CRRT.  Needs to diurese,  add diuretic. 2 ^ BP, add lasix 3 Anemia follow 4 Hip infx Ortho, on Merum 5 Obesity 6 DM controlled 7 CAD 8 Brady resolved 9 Sepsis resolved 10 DJD 11Hypothyroid P Lasix, follow vol , K,     LOS: 5 days   Dalonda Simoni L 11/21/2015,7:44  AM

## 2015-11-21 NOTE — Progress Notes (Signed)
PROGRESS NOTE    [name] [name]  LYH:[id number] DOB: [birth year] DOA: 11/16/2015 PCP: [name], MD   Brief Narrative:  81 y.o. WF PMHx CVA, DM Type 2 uncontrolled with complication,PAF (paroxysmal atrial fibrillation), CAD native artery,Chronic Diastolic CHF, HTN, Asthma, HLD, hypothyroidism, RA, CKD stage III,  Recent Rt Hip Hemiarthroplasty roughly 10/27/15 complicated by infected surgical site. She was admitted again to [facility] on 6/27 and underwent I&D of the right hip with implantation of spacer and wound vac and was started on empiric vanc / zosyn. In addition, she had severe bradycardia with HR in the 20's. She was seen in consultation by cardiology who felt that bradycardia was related to amiodarone and metoprolol; therefore, these were discontinued and pt was started on Isuprel. Her hospital stay was complicated by anemia for which she received 4u PRBC transfusion along with worsening renal failure.  Patient with resolving bradycardia from junctional rhythm and now normal rate and rhythm. Weaned off dopamine 7/1 . Orthopedics following given septic joint with wound VAC and spacer in place. Nephrology following for ongoing renal failure. UOP good, scr now at baseline . CRRT stopped yesterday. Has anemia for which heparin drip has been dc'd. Pt has been stable in ICU off CRRT since 7/2. Plan for transfer to SDU today and will be on [facility] service starting 7/4. PCCM will sign off starting 7/4. I discussed case with Dr. Sharon Seller.   Assessment & Plan:   Active Problems:   Sepsis (HCC)   AKI (acute kidney injury) (HCC)   Metabolic acidosis   Hypocalcemia   Acute respiratory failure with hypoxia (HCC)   Pressure ulcer   Acute renal failure (ARF) (HCC)   HCAP (healthcare-associated pneumonia)   Sinus bradycardia   Sepsis, unspecified organism (HCC)   Postoperative wound infection of right hip   MSSA infection, non-invasive   Infection caused by Enterobacter cloacae  Infection, Klebsiella   Paroxysmal atrial fibrillation (HCC)   Acute blood loss anemia   Neurogenic bladder  Sepsis - Due to infected right hip positive-MSSA, Enterobacter, Klebsiella  - S/P I/D 6/27 w/ spacer & wound vac - S/P R Hip Hemiarthroplasty 6/9. -Continue current antibiotics -Orthopedics following  -Wound Care C/S for Wound Vac   Possible LLL HCAP/Acute Respiratory Failure with hypoxia -Titrate O2 to maintain SPO2 > 92% -Merrem day #3   Bradycardia  - Reportedly 3rd degree AVB at [place]. -Resolved  PAF  - As outpatient on Eliquis.  -Currently in NSR -Cardiology following; restart amiodarone when more stable -Continued significant bleeding from surgical site will hold on restarting heparin until 7/5 -Transfuse for hemoglobin<8]  Recent Labs Lab 11/19/15 1000 11/19/15 2000 11/20/15 0405 11/20/15 1800 11/21/15 0503  HGB 8.1* 7.7* 7.4* 8.0* 7.9*  -TTE on 7/2: Compared to the prior study in 07/2015, the LVEF is higher at 60-65%. There may be mild aortic stenosis. The mitral annulus is calcified.  Pulmonary arteries: PA peak pressure: 57 mm Hg (S).  HTN  Pulmonary hypertension    CAD native artery   HLD  Acute Renal Failure(baseline Cr 1.11) Lab Results  Component Value Date   CREATININE 1.17* 11/21/2015   CREATININE 1.11* 11/20/2015   CREATININE 1.23* 11/19/2015  -Renal ultrasound-negative Hydronephrosis  - CRRT started 7/1 and ended on 7/2  -Strict in and out -Daily weight Filed Weights   11/20/15 0300 11/20/15 1700 11/21/15 0500  Weight: 108.6 kg (239 lb 6.7 oz) 48.308 kg (106 lb 8 oz) 110.8 kg (244 lb 4.3 oz)    Acute  blood loss anemia  -Transfused 4 units PRBC at [facility]?  -7/2 transfuse 1 unit PRBC  Recent Labs Lab 11/19/15 1000 11/19/15 2000 11/20/15 0405 11/20/15 1800 11/21/15 0503  HGB 8.1* 7.7* 7.4* 8.0* 7.9*  -If patient H/H still stable overnight restart heparin on 7/5. Ensure remained on low end of anticoagulation  DM  Type 2-Uncontrolled with Complications -5/23 hemoglobin A1c= 10.5 -Sensitive SSI  Hypothyroidism - TSH 1.861.  Anxiety/Depression  Neurogenic Bladder - Chronic foley placed at [facility].   DVT prophylaxis: SCD Code Status: Full Family Communication: None available Disposition Plan: Per orthopedic surgery     Consultants:  Dr.James Deterding nephrology Dr.Peter M [place] cardiology   Procedures/Significant Events:  TTE 07/20/15:Atrial fibrillation. LVEF 55-60%. Normal wall motion. Unable to assess diastolic function. Mild mitral regurgitation. RV normal in size and function. Pulmonary artery systolic pressure 38 mmHg. 06/09 - right hip hemiarthroplasty 06/27 - admitted with infected right hip to [facility]. Taken to OR for I&D and spacer implantation. 06/29 - Worsened renal function. Ttransferred to [facility] for further evaluation and management. -Transfused 4 units PRBC?? EKG 6/29: Junctional bradycardia. Port CXR 6/30: Right hilar fullness with silhouetting of the left hemidiaphragm suggestive of retrocardiac opacity. Globular heart with suggestion of cardiomegaly. 7/1 CRRT started >> 7/2 TTE on 7/2: Compared to the prior study in 07/2015, the LVEF is higher at 60-65%. There may be mild aortic stenosis. The mitral annulus is calcified. Pulmonary arteries: PA peak pressure: 57 mm Hg (S). 7/2 transfuse 1 units PRBC    Cultures Blood Ctx x2 6/29>> R Hip Ctx 6/29>> (poss Staph aureus & 2 GNR)>Staph Aureus(MSSA) , Enterobacter , Klebsiella PNA    Antimicrobials: Vancomycin 06/27>>7/1 Zozyn 06/27>> 7/1 Merrem 7/1 >>    Devices Wound VAC in place right thigh Drain in place right   LINES / TUBES:  R IJ CVL ([place]) > L radial A line ([place]) > FOLEY [facility]) >    Continuous Infusions:    Subjective: 7/4  A/O 2 (does not know when, why), negative N/V, negative right hip pain. States negative O2 at home.   Objective: Filed Vitals:    11/21/15 0725 11/21/15 1210 11/21/15 1606 11/21/15 1942  BP: 150/52 156/54 155/52 159/46  Pulse: 66 67 61   Temp: 98.2 F (36.8 C) 98.2 F (36.8 C) 98.3 F (36.8 C) 98.5 F (36.9 C)  TempSrc: Oral Oral Oral Oral  Resp: 15 21 19 24   Height:      Weight:      SpO2: 96% 94% 92% 96%    Intake/Output Summary (Last 24 hours) at 11/21/15 2016 Last data filed at 11/21/15 1959  Gross per 24 hour  Intake   1070 ml  Output   2450 ml  Net  -1380 ml   Filed Weights   11/20/15 0300 11/20/15 1700 11/21/15 0500  Weight: 108.6 kg (239 lb 6.7 oz) 48.308 kg (106 lb 8 oz) 110.8 kg (244 lb 4.3 oz)    Examination:  General: A/O 2 (does not know when, why), NAD, No acute respiratory distress Eyes: negative scleral hemorrhage, negative anisocoria, negative icterus ENT: Negative Runny nose, negative gingival bleeding, Neck:  Negative scars, masses, torticollis, lymphadenopathy, JVD, right IJ CVL in place (dressing and Biostat pad coming loose) Lungs: Clear to auscultation bilaterally without wheezes or crackles Cardiovascular: Regular rate and rhythm without murmur gallop or rub normal S1 and S2 Abdomen: Morbidly obese, nondistended, positive soft, bowel sounds, no rebound, no ascites, no appreciable mass Extremities: right thigh swollen,  nontender, wound VAC in place on lateral aspect of thigh draining dark blood, drain in place also draining dark blood. Negative sign of infection. Skin: RLL and LLL strapped together, surgical incision right lateral thigh with wound VAC in drain in place. Old RLL knee surgical incision negative sign of infection.  Psychiatric:  Negative depression, negative anxiety, negative fatigue, negative mania  Central nervous system:  Cranial nerves II through XII intact, tongue/uvula midline, all extremities muscle strength 5/5, sensation intact throughout, negative dysarthria, negative expressive aphasia, negative receptive aphasia.  .     Data Reviewed: Care during the  described time interval was provided by me .  I have reviewed this patient's available data, including medical history, events of note, physical examination, and all test results as part of my evaluation. I have personally reviewed and interpreted all radiology studies.  CBC:  Recent Labs Lab 11/16/15 2325  11/18/15 0501  11/19/15 1000 11/19/15 2000 11/20/15 0405 11/20/15 1800 11/21/15 0503  WBC 16.7*  < > 17.4*  < > 13.1* 11.6* 10.4 12.5* 12.7*  NEUTROABS 15.5*  --  14.1*  --   --   --   --   --   --   HGB 8.9*  < > 7.3*  < > 8.1* 7.7* 7.4* 8.0* 7.9*  HCT 27.2*  < > 22.4*  < > 24.5* 23.8* 23.1* 25.0* 25.0*  MCV 88.3  < > 87.2  < > 89.1 89.5 90.9 91.2 91.9  PLT 344  < > 413*  < > 332 279 293 316 321  < > = values in this interval not displayed. Basic Metabolic Panel:  Recent Labs Lab 11/16/15 2325  11/18/15 0501 11/18/15 0502 11/18/15 1612 11/19/15 0445 11/20/15 0405 11/21/15 0503  NA 127*  < >  --  130* 134* 135 142 139  K 5.3*  < >  --  4.1 3.6 3.8 3.8 4.0  CL 103  < >  --  99* 103 102 99* 107  CO2 17*  < >  --  23 24 26 25 23   GLUCOSE 277*  < >  --  125* 69 131* 129* 135*  BUN 45*  < >  --  37* 22* 12 7 8   CREATININE 4.51*  < >  --  3.55* 2.05* 1.23* 1.11* 1.17*  CALCIUM 6.8*  < >  --  6.6* 6.7* 7.0* 8.5* 7.9*  MG 1.1*  --  1.3*  --   --  2.0  --   --   PHOS 5.7*  --   --  4.7* 3.4 2.4*  --  2.4*  < > = values in this interval not displayed. GFR: Estimated Creatinine Clearance: 47.4 mL/min (by C-G formula based on Cr of 1.17). Liver Function Tests:  Recent Labs Lab 11/16/15 2325 11/18/15 0502 11/18/15 1612 11/21/15 0503  AST 15  --   --   --   ALT 8*  --   --   --   ALKPHOS 108  --   --   --   BILITOT 0.5  --   --   --   PROT 5.0*  --   --   --   ALBUMIN 1.5* 1.6* 1.5* 1.7*   No results for input(s): LIPASE, AMYLASE in the last 168 hours. No results for input(s): AMMONIA in the last 168 hours. Coagulation Profile:  Recent Labs Lab 11/16/15 2325    INR 1.36   Cardiac Enzymes:  Recent Labs Lab 11/16/15 2325  TROPONINI  0.06*   BNP (last 3 results)  Recent Labs  06/15/15 1100 07/18/15 1429  PROBNP 391.0* 321.0*   HbA1C: No results for input(s): HGBA1C in the last 72 hours. CBG:  Recent Labs Lab 11/20/15 1114 11/20/15 2158 11/21/15 0724 11/21/15 1207 11/21/15 1605  GLUCAP 161* 107* 146* 173* 170*   Lipid Profile: No results for input(s): CHOL, HDL, LDLCALC, TRIG, CHOLHDL, LDLDIRECT in the last 72 hours. Thyroid Function Tests: No results for input(s): TSH, T4TOTAL, FREET4, T3FREE, THYROIDAB in the last 72 hours. Anemia Panel: No results for input(s): VITAMINB12, FOLATE, FERRITIN, TIBC, IRON, RETICCTPCT in the last 72 hours. Urine analysis:    Component Value Date/Time   COLORURINE AMBER* 11/17/2015 1140   COLORURINE Yellow 05/16/2014 1433   APPEARANCEUR TURBID* 11/17/2015 1140   APPEARANCEUR Hazy 05/16/2014 1433   LABSPEC 1.033* 11/17/2015 1140   LABSPEC 1.006 05/16/2014 1433   PHURINE 5.0 11/17/2015 1140   PHURINE 5.0 05/16/2014 1433   GLUCOSEU NEGATIVE 11/17/2015 1140   GLUCOSEU Negative 05/16/2014 1433   HGBUR LARGE* 11/17/2015 1140   HGBUR Negative 05/16/2014 1433   BILIRUBINUR SMALL* 11/17/2015 1140   BILIRUBINUR Negative 05/16/2014 1433   BILIRUBINUR Negative 10/06/2013 1353   KETONESUR 15* 11/17/2015 1140   KETONESUR Negative 05/16/2014 1433   PROTEINUR 30* 11/17/2015 1140   PROTEINUR Negative 05/16/2014 1433   PROTEINUR 100 10/06/2013 1353   UROBILINOGEN 0.2 06/25/2014 1725   UROBILINOGEN 0.2 10/06/2013 1353   NITRITE NEGATIVE 11/17/2015 1140   NITRITE Negative 05/16/2014 1433   NITRITE Negative 10/06/2013 1353   LEUKOCYTESUR LARGE* 11/17/2015 1140   LEUKOCYTESUR Negative 05/16/2014 1433   Sepsis Labs: @LABRCNTIP (procalcitonin:4,lacticidven:4)  ) Recent Results (from the past 240 hour(s))  MRSA PCR Screening     Status: None   Collection Time: 11/16/15 10:06 PM  Result Value Ref  Range Status   MRSA by PCR NEGATIVE NEGATIVE Final    Comment:        The GeneXpert MRSA Assay (FDA approved for NASAL specimens only), is one component of a comprehensive MRSA colonization surveillance program. It is not intended to diagnose MRSA infection nor to guide or monitor treatment for MRSA infections.          Radiology Studies: No results found.      Scheduled Meds: . antiseptic oral rinse  7 mL Mouth Rinse q12n4p  . atorvastatin  20 mg Oral q1800  . chlorhexidine  15 mL Mouth Rinse BID  . furosemide  80 mg Oral Daily  . insulin aspart  0-9 Units Subcutaneous TID WC  . levothyroxine  150 mcg Oral QAC breakfast  . meropenem (MERREM) IV  1 g Intravenous Q12H  . pantoprazole  40 mg Oral Q1200  . sertraline  50 mg Oral Daily   Continuous Infusions:    LOS: 5 days    Time spent: 40 minutes    Taunja Brickner, 11/18/15, MD [facility] Pager [phone]   If 7PM-7AM, please contact night-coverage [email or web address] Password [email or web address] 11/21/2015, 8:16 PM

## 2015-11-22 LAB — RENAL FUNCTION PANEL
Albumin: 1.9 g/dL — ABNORMAL LOW (ref 3.5–5.0)
Anion gap: 12 (ref 5–15)
BUN: 10 mg/dL (ref 6–20)
CHLORIDE: 103 mmol/L (ref 101–111)
CO2: 24 mmol/L (ref 22–32)
CREATININE: 1.22 mg/dL — AB (ref 0.44–1.00)
Calcium: 8.1 mg/dL — ABNORMAL LOW (ref 8.9–10.3)
GFR calc Af Amer: 48 mL/min — ABNORMAL LOW (ref >60)
GFR, EST NON AFRICAN AMERICAN: 41 mL/min — AB (ref >60)
GLUCOSE: 185 mg/dL — AB (ref 65–99)
Phosphorus: 2.3 mg/dL — ABNORMAL LOW (ref 2.5–4.6)
Potassium: 3.8 mmol/L (ref 3.5–5.1)
Sodium: 139 mmol/L (ref 135–145)

## 2015-11-22 LAB — GLUCOSE, CAPILLARY
GLUCOSE-CAPILLARY: 277 mg/dL — AB (ref 65–99)
Glucose-Capillary: 180 mg/dL — ABNORMAL HIGH (ref 65–99)
Glucose-Capillary: 174 mg/dL — ABNORMAL HIGH (ref 65–99)
Glucose-Capillary: 169 mg/dL — ABNORMAL HIGH (ref 65–99)

## 2015-11-22 LAB — FERRITIN: Ferritin: 216 ng/mL (ref 11–307)

## 2015-11-22 LAB — CBC WITH DIFFERENTIAL/PLATELET
Basophils Absolute: 0 10*3/uL (ref 0.0–0.1)
Basophils Relative: 0 %
EOS PCT: 0 %
Eosinophils Absolute: 0.1 10*3/uL (ref 0.0–0.7)
HCT: 26.2 % — ABNORMAL LOW (ref 36.0–46.0)
HEMOGLOBIN: 8.3 g/dL — AB (ref 12.0–15.0)
LYMPHS ABS: 1.5 10*3/uL (ref 0.7–4.0)
LYMPHS PCT: 10 %
MCH: 30 pg (ref 26.0–34.0)
MCHC: 31.7 g/dL (ref 30.0–36.0)
MCV: 94.6 fL (ref 78.0–100.0)
MONO ABS: 0.7 10*3/uL (ref 0.1–1.0)
MONOS PCT: 5 %
NEUTROS ABS: 12.6 10*3/uL — AB (ref 1.7–7.7)
Neutrophils Relative %: 85 %
Platelets: 293 10*3/uL (ref 150–400)
RBC: 2.77 MIL/uL — AB (ref 3.87–5.11)
RDW: 16.9 % — ABNORMAL HIGH (ref 11.5–15.5)
WBC: 14.9 10*3/uL — ABNORMAL HIGH (ref 4.0–10.5)

## 2015-11-22 LAB — MAGNESIUM: Magnesium: 1.5 mg/dL — ABNORMAL LOW (ref 1.7–2.4)

## 2015-11-22 LAB — IRON AND TIBC
IRON: 37 ug/dL (ref 28–170)
SATURATION RATIOS: 19 % (ref 10.4–31.8)
TIBC: 197 ug/dL — AB (ref 250–450)
UIBC: 160 ug/dL

## 2015-11-22 LAB — HEPARIN LEVEL (UNFRACTIONATED)

## 2015-11-22 LAB — APTT: APTT: 30 s (ref 24–37)

## 2015-11-22 MED ORDER — HEPARIN (PORCINE) IN NACL 100-0.45 UNIT/ML-% IJ SOLN
950.0000 [IU]/h | INTRAMUSCULAR | Status: DC
  Administered 2015-11-22: 650 [IU]/h via INTRAVENOUS
  Administered 2015-11-23: 850 [IU]/h via INTRAVENOUS
  Filled 2015-11-22 (×2): qty 250

## 2015-11-22 MED ORDER — DARBEPOETIN ALFA 100 MCG/0.5ML IJ SOSY
100.0000 ug | PREFILLED_SYRINGE | INTRAMUSCULAR | Status: DC
  Administered 2015-11-22: 100 ug via SUBCUTANEOUS
  Filled 2015-11-22 (×2): qty 0.5

## 2015-11-22 MED ORDER — MAGNESIUM SULFATE 2 GM/50ML IV SOLN
2.0000 g | Freq: Once | INTRAVENOUS | Status: AC
  Administered 2015-11-22: 2 g via INTRAVENOUS
  Filled 2015-11-22: qty 50

## 2015-11-22 MED ORDER — BOOST / RESOURCE BREEZE PO LIQD
1.0000 | Freq: Three times a day (TID) | ORAL | Status: DC
  Administered 2015-11-22 – 2015-11-27 (×11): 1 via ORAL

## 2015-11-22 MED ORDER — INSULIN GLARGINE 100 UNIT/ML ~~LOC~~ SOLN
10.0000 [IU] | Freq: Every day | SUBCUTANEOUS | Status: DC
  Administered 2015-11-22 – 2015-11-23 (×2): 10 [IU] via SUBCUTANEOUS
  Filled 2015-11-22 (×3): qty 0.1

## 2015-11-22 MED ORDER — AMLODIPINE BESYLATE 10 MG PO TABS
10.0000 mg | ORAL_TABLET | Freq: Every day | ORAL | Status: DC
  Administered 2015-11-22 – 2015-11-27 (×6): 10 mg via ORAL
  Filled 2015-11-22 (×6): qty 1

## 2015-11-22 MED ORDER — PRO-STAT SUGAR FREE PO LIQD
30.0000 mL | Freq: Three times a day (TID) | ORAL | Status: DC
  Administered 2015-11-22 – 2015-11-27 (×12): 30 mL via ORAL
  Filled 2015-11-22 (×15): qty 30

## 2015-11-22 MED ORDER — ALBUTEROL SULFATE (2.5 MG/3ML) 0.083% IN NEBU: 2.5000 mg | INHALATION_SOLUTION | RESPIRATORY_TRACT | Status: DC | PRN

## 2015-11-22 NOTE — Consult Note (Addendum)
Call from bedside nurse, per bedside nurse they have received orders to pull HV drains.  Bedside nurses are able to pull drains, services of wound care nurse not required.  I have advised the bedside nurse of the location of the proximal drain in relationship to the incisional VAC dressing and to please have them be careful with incisional dressing when they remove the HV drain.  Dual lumen HV drains currently isolated under their own dressings.  Orders written to apply dry dressing once drains are removed.  WOC will re-eval incision on Monday for continued need for incisional NPWT.  Mionna Advincula [place], [place] [phone]

## 2015-11-22 NOTE — Progress Notes (Addendum)
Patient ID: [name] [name], female   DOB: [birth year], 81 y.o.   MRN: [id number] [facility] Progress Note   Assessment/ Plan:   1. AKI on chronic kidney disease stage III: Baseline creatinine appears to range between 1.0-1.5. With impressive 2.7 L urine output overnight on furosemide 80 mg daily- remains hypervolemic based on physical exam. Creatinine appears essentially unchanged overnight and likely indicating of continued renal recovery. If her renal function remains stable over the next 24 hours-would recommend discontinuing dialysis catheter. 2. Hypertension: Blood pressures elevated and anticipated to improve with improvement of volume status with ongoing diuretic therapy. 3. Sepsis following complications of right hip surgery: With wound VAC currently in place and receiving intravenous meropenem. Afebrile overnight. 4. Anemia: Likely anemia of blood loss (recent hip surgery) and critical illness (sepsis). I will check iron studies and replace this if deficient, start ESA. 5. Hypothyroidism: Remains on levothyroxine supplementation  Will s/o at this time--- D/C [facility] tomorrow if renal function remains stable. She can follow up with her PCP upon DC for ongoing monitoring of renal function  Subjective:   Reports to be feeling somewhat better and remarks that she has "swelling all over". She appears to be intermittently confused during our conversation.    Objective:   BP 161/54 mmHg  Pulse 61  Temp(Src) 98.3 F (36.8 C) (Oral)  Resp 26  Ht 5\' 3"  (1.6 m)  Wt 108 kg (238 lb 1.6 oz)  BMI 42.19 kg/m2  SpO2 96%  Intake/Output Summary (Last 24 hours) at 11/22/15 0731 Last data filed at 11/22/15 0248  Gross per 24 hour  Intake   1090 ml  Output   2700 ml  Net  -1610 ml   Weight change: 59.692 kg (131 lb 9.6 oz)  Physical Exam: 01/23/16 resting in bed, switching television channels CVS: Pulse regular rate and rhythm, S1 and S2 with ESM Resp: Decreased breath  sounds over bases-poor inspiratory effort, no rales Abd: Soft, obese, nontender Ext: Both legs in form immobilizers. Right hip wound VAC in place.  Imaging: No results found.  Labs: BMET  Recent Labs Lab 11/16/15 2325  11/17/15 1620 11/18/15 0502 11/18/15 1612 11/19/15 0445 11/20/15 0405 11/21/15 0503 11/22/15 0540  NA 127*  < > 128* 130* 134* 135 142 139 139  K 5.3*  < > 5.4* 4.1 3.6 3.8 3.8 4.0 3.8  CL 103  < > 98* 99* 103 102 99* 107 103  CO2 17*  < > 21* 23 24 26 25 23 24   GLUCOSE 277*  < > 97 125* 69 131* 129* 135* 185*  BUN 45*  < > 46* 37* 22* 12 7 8 10   CREATININE 4.51*  < > 4.68* 3.55* 2.05* 1.23* 1.11* 1.17* 1.22*  CALCIUM 6.8*  < > 6.8* 6.6* 6.7* 7.0* 8.5* 7.9* 8.1*  PHOS 5.7*  --   --  4.7* 3.4 2.4*  --  2.4* 2.3*  < > = values in this interval not displayed. CBC  Recent Labs Lab 11/16/15 2325  11/18/15 0501  11/20/15 0405 11/20/15 1800 11/21/15 0503 11/22/15 0540  WBC 16.7*  < > 17.4*  < > 10.4 12.5* 12.7* 14.9*  NEUTROABS 15.5*  --  14.1*  --   --   --   --  12.6*  HGB 8.9*  < > 7.3*  < > 7.4* 8.0* 7.9* 8.3*  HCT 27.2*  < > 22.4*  < > 23.1* 25.0* 25.0* 26.2*  MCV 88.3  < > 87.2  < >  90.9 91.2 91.9 94.6  PLT 344  < > 413*  < > 293 316 321 293  < > = values in this interval not displayed.  Medications:    . antiseptic oral rinse  7 mL Mouth Rinse q12n4p  . atorvastatin  20 mg Oral q1800  . chlorhexidine  15 mL Mouth Rinse BID  . furosemide  80 mg Oral Daily  . insulin aspart  0-9 Units Subcutaneous TID WC  . levothyroxine  150 mcg Oral QAC breakfast  . meropenem (MERREM) IV  1 g Intravenous Q12H  . pantoprazole  40 mg Oral Q1200  . sertraline  50 mg Oral Daily   Zetta Bills, MD 11/22/2015, 7:31 AM

## 2015-11-22 NOTE — Progress Notes (Addendum)
Initial Nutrition Assessment  DOCUMENTATION CODES:   Morbid obesity  INTERVENTION:   Boost Breeze po TID, each supplement provides 250 kcal and 9 grams of protein  Prostat liquid protein po 30 ml TID with meals, each supplement provides 100 kcal, 15 grams protein  NUTRITION DIAGNOSIS:   Inadequate oral intake related to poor appetite as evidenced by meal completion < 25%  GOAL:   Patient will meet greater than or equal to 90% of their needs  MONITOR:   PO intake, Supplement acceptance, Labs, Weight trends, Skin, I & O's  REASON FOR ASSESSMENT:   Consult Poor PO  ASSESSMENT:   81 y.o. Female with PMH as outlined below including recent right hip hemiarthroplasty roughly 10/27/15 complicated by infected surgical site. She was admitted again to [facility] on 6/27 and underwent I&D of the right hip with implantation of spacer and wound vac and was started on empiric vanc / zosyn. In addition, she had severe bradycardia with HR in the 20's. She was seen in consultation by cardiology who felt that bradycardia was related to amiodarone and metoprolol; therefore, these were discontinued and pt was started on Isuprel. Her hospital stay was complicated by anemia for which she received 4u PRBC transfusion along with worsening renal failure.  Patient working with PT upon visit >> did not disturb. RD consulted per RN >> pt with poor PO intake (5-50%). Doesn't like Ensure Enlive oral nutrition supplements. CWOCN note 7/3 reviewed >> pt with NPWT VAC to R hip. Will add Boost Breeze and Protein liquid protein supplements. No muscle or subcutaneous fat depletion noticed.  Diet Order:  Diet Carb Modified Fluid consistency:: Thin; Room service appropriate?: Yes  Skin:  Wound (see comment) (Stage II to sacrum, wound VAC to R hip)  Last BM:  7/5  Height:   Ht Readings from Last 1 Encounters:  11/22/15 5\' 3"  (1.6 m)    Weight:   Wt Readings from Last 1 Encounters:  11/22/15 238 lb  (107.956 kg)    Ideal Body Weight:  52.2 kg  BMI:  Body mass index is 42.17 kg/(m^2).  Estimated Nutritional Needs:   Kcal:  1750-1950  Protein:  110-120 gm  Fluid:  1.7-1.9 L  EDUCATION NEEDS:   No education needs identified at this time  01/23/16, RD, LDN Pager #: [phone] After-Hours Pager #: [phone]

## 2015-11-22 NOTE — Patient Instructions (Signed)
Hip Precautions: Limit Hip Flexion    Do not bend forward at hips past ____ degrees while standing, sitting or lying down.   Copyright  VHI. All rights reserved.

## 2015-11-22 NOTE — Progress Notes (Signed)
[place] TEAM 1 - Stepdown/ICU TEAM  [name] [name]  NLG:[id number] DOB: 1934/07/17 DOA: 11/16/2015 PCP: [name], MD   Brief Narrative:  81 y.o. F Hx CVA, DM2, PAF, CAD, Chronic Diastolic CHF, HTN, Asthma, HLD, hypothyroidism, RA, CKD stage III, and a Rt Hip Hemiarthroplasty 10/27/15 complicated by infected surgical site. She was admitted to [facility] on 6/27 and underwent I&D of the right hip with implantation of spacer and wound vac and was started on empiric vanc / zosyn. In addition, she had severe bradycardia with HR in the 20's. She was seen in consultation by Cardiology who felt the bradycardia was related to amiodarone and metoprolol; therefore, these were discontinued and pt was started on Isuprel. Her hospital stay was complicated by anemia for which she received 4u PRBC transfusion along with worsening renal failure.  She was transferred to [facility] 6/29 for further evaluation and management.  Since arrival at [facility], the pt has experienced resolving bradycardia from junctional rhythm > normal rate and rhythm. Weaned off dopamine 7/1 . Orthopedics has been following given septic joint with wound VAC and spacer in place. Nephrology has been following for ongoing renal failure. UOP good, scr now at baseline . CRRT stopped 7/2. Had anemia for which heparin drip has been dc'd.   Assessment & Plan:   Sepsis due to MSSA, Enterobacter, Klebsiella infected right hip positive - S/P R Hip Hemiarthroplasty 6/9 - S/P I/D 6/27 w/ spacer & wound vac -Continue current antibiotics -Orthopedics evaluated at time of admit but not actively following  -Wound Care being managed by WOC under direction of Ortho   Acute Renal Failure -baseline Cr 1.0-1.5 -Renal US negative hydronephrosis  -CRRT started 7/1 and ended on 7/2  -if crt remains stable will plan to d/c HD cath on 7/6  Recent Labs Lab 11/18/15 0502 11/18/15 1612 11/19/15 0445 11/20/15 0405 11/21/15 0503 11/22/15 0540    CREATININE 3.55* 2.05* 1.23* 1.11* 1.17* 1.22*    Possible LLL HCAP / Acute Hypoxic Respiratory Failure -Titrate O2 to maintain SPO2 > 92% -not convinced this represents an infection - is nonetheless covered w/ abx indicated for joint infection   Bradycardia  -Reportedly 3rd degree AVB at [facility] -Resolved  PAF  -Chadsvasc 9 -on Eliquis + Amio as outpatient  -Currently in NSR -Cardiology suggests resuming amiodarone at 200 mg daily at time of discharge -resume heparin today and follow for re-bleeding   Chronic diastolic congestive heart failure -TTE 7/2 EF 60-65% - mild aortic stenosis, PA peak pressure: 57 mm Hg (S) - well compensated at present   HTN -BP not at goal - adjust tx and follow  Pulmonary hypertension  -PA peak pressure: 57 mm Hg (S)  CAD    HLD  Acute blood loss anemia  -Transfused 4 units PRBC at [facility]?  -Transfused 1 unit PRBC thus far at [facility] -resume heparin today and follow    Recent Labs Lab 11/19/15 2000 11/20/15 0405 11/20/15 1800 11/21/15 0503 11/22/15 0540  HGB 7.7* 7.4* 8.0* 7.9* 8.3*    DM2 -5/23 A1c 10.5 - CBG trending upward - adjust tx and follow   Hypomagnesemia -replace and follow   Hypothyroidism -TSH 1.861  Anxiety/Depression  Neurogenic Bladder -Chronic foley placed at [facility]  DVT prophylaxis: SCDs Code Status: Full Family Communication: No family present at time of exam Disposition Plan: follow in SDU until clear bleeding has stopped following heparin resumption   Consultants:  PCCM Nephrology  Cardiology   Procedures/Significant Events:  06/09 -  right hip hemiarthroplasty 06/27 - admitted to [facility] with infected right hip - taken to OR for I&D and spacer implantation 06/29 - Worsened renal function - Transfused 4 units PRBC?? - transferred to [facility] EKG 6/29: Junctional bradycardia. Port CXR 6/30: Right hilar fullness with silhouetting of the left hemidiaphragm suggestive of  retrocardiac opacity. Globular heart with suggestion of cardiomegaly. 7/1 CRRT started > 7/2 TTE on 7/2 EF 60-65%. There may be mild aortic stenosis. The mitral annulus is calcified. Pulmonary arteries: PA peak pressure: 57 mm Hg (S). 7/2 transfuse 1 units PRBC  Antimicrobials: Zozyn 06/27 > 7/1 Merrem 7/1 >  Subjective: The patient is alert and conversant but mildly confused.  She denies chest pain fevers chills nausea or vomiting.  She does not recall the exact details leading to her hospital stay.   Objective: Filed Vitals:   11/22/15 0350 11/22/15 0430 11/22/15 0500 11/22/15 0733  BP:  161/54  154/55  Pulse:    67  Temp: 98.3 F (36.8 C)   98.5 F (36.9 C)  TempSrc: Oral   Oral  Resp:  26  20  Height:      Weight:   108 kg (238 lb 1.6 oz)   SpO2:  96%  94%    Intake/Output Summary (Last 24 hours) at 11/22/15 0850 Last data filed at 11/22/15 0248  Gross per 24 hour  Intake   1090 ml  Output   2700 ml  Net  -1610 ml   Filed Weights   11/20/15 1700 11/21/15 0500 11/22/15 0500  Weight: 48.308 kg (106 lb 8 oz) 110.8 kg (244 lb 4.3 oz) 108 kg (238 lb 1.6 oz)    Examination: General: No acute respiratory distress Lungs: Clear to auscultation bilaterally without wheezes or crackles Cardiovascular: Regular rate and rhythm without murmur gallop or rub normal S1 and S2 Abdomen: Nontender, nondistended, soft, bowel sounds positive, no rebound, no ascites, no appreciable mass Extremities: No significant cyanosis or clubbing - trace B LE edema   CBC:  Recent Labs Lab 11/16/15 2325  11/18/15 0501  11/19/15 2000 11/20/15 0405 11/20/15 1800 11/21/15 0503 11/22/15 0540  WBC 16.7*  < > 17.4*  < > 11.6* 10.4 12.5* 12.7* 14.9*  NEUTROABS 15.5*  --  14.1*  --   --   --   --   --  12.6*  HGB 8.9*  < > 7.3*  < > 7.7* 7.4* 8.0* 7.9* 8.3*  HCT 27.2*  < > 22.4*  < > 23.8* 23.1* 25.0* 25.0* 26.2*  MCV 88.3  < > 87.2  < > 89.5 90.9 91.2 91.9 94.6  PLT 344  < > 413*  < > 279 293  316 321 293  < > = values in this interval not displayed. Basic Metabolic Panel:  Recent Labs Lab 11/16/15 2325  11/18/15 0501 11/18/15 0502 11/18/15 1612 11/19/15 0445 11/20/15 0405 11/21/15 0503 11/22/15 0540  NA 127*  < >  --  130* 134* 135 142 139 139  K 5.3*  < >  --  4.1 3.6 3.8 3.8 4.0 3.8  CL 103  < >  --  99* 103 102 99* 107 103  CO2 17*  < >  --  23 24 26 25 23 24   GLUCOSE 277*  < >  --  125* 69 131* 129* 135* 185*  BUN 45*  < >  --  37* 22* 12 7 8 10   CREATININE 4.51*  < >  --  3.55* 2.05*  1.23* 1.11* 1.17* 1.22*  CALCIUM 6.8*  < >  --  6.6* 6.7* 7.0* 8.5* 7.9* 8.1*  MG 1.1*  --  1.3*  --   --  2.0  --   --  1.5*  PHOS 5.7*  --   --  4.7* 3.4 2.4*  --  2.4* 2.3*  < > = values in this interval not displayed. GFR: Estimated Creatinine Clearance: 44.8 mL/min (by C-G formula based on Cr of 1.22).   Liver Function Tests:  Recent Labs Lab 11/16/15 2325 11/18/15 0502 11/18/15 1612 11/21/15 0503 11/22/15 0540  AST 15  --   --   --   --   ALT 8*  --   --   --   --   ALKPHOS 108  --   --   --   --   BILITOT 0.5  --   --   --   --   PROT 5.0*  --   --   --   --   ALBUMIN 1.5* 1.6* 1.5* 1.7* 1.9*   Coagulation Profile:  Recent Labs Lab 11/16/15 2325  INR 1.36   Cardiac Enzymes:  Recent Labs Lab 11/16/15 2325  TROPONINI 0.06*    CBG:  Recent Labs Lab 11/20/15 2158 11/21/15 0724 11/21/15 1207 11/21/15 1605 11/21/15 2151  GLUCAP 107* 146* 173* 170* 201*    Recent Results (from the past 240 hour(s))  MRSA PCR Screening     Status: None   Collection Time: 11/16/15 10:06 PM  Result Value Ref Range Status   MRSA by PCR NEGATIVE NEGATIVE Final    Comment:        The GeneXpert MRSA Assay (FDA approved for NASAL specimens only), is one component of a comprehensive MRSA colonization surveillance program. It is not intended to diagnose MRSA infection nor to guide or monitor treatment for MRSA infections.      Scheduled Meds: . antiseptic  oral rinse  7 mL Mouth Rinse q12n4p  . atorvastatin  20 mg Oral q1800  . chlorhexidine  15 mL Mouth Rinse BID  . darbepoetin (ARANESP) injection - NON-DIALYSIS  100 mcg Subcutaneous Q Wed-1800  . furosemide  80 mg Oral Daily  . insulin aspart  0-9 Units Subcutaneous TID WC  . levothyroxine  150 mcg Oral QAC breakfast  . meropenem (MERREM) IV  1 g Intravenous Q12H  . pantoprazole  40 mg Oral Q1200  . sertraline  50 mg Oral Daily     LOS: 6 days    Time spent: 35 minutes  Lonia Blood, MD [facility] Office  [phone] Pager - Text Page per Loretha Stapler as per below:  On-Call/Text Page:      [email or web address]      password [email or web address]  If 7PM-7AM, please contact night-coverage [email or web address] Password [email or web address] 11/22/2015, 8:51 AM

## 2015-11-22 NOTE — Progress Notes (Addendum)
ANTICOAGULATION & ANTIBIOTIC CONSULT NOTE - Initial Consult  Pharmacy Consult for Heparin; Merrem Indication: atrial fibrillation; MSSA R-hip infection and HCAP coverage  Allergies  Allergen Reactions  . Oxycodone Nausea And Vomiting    Patient Measurements: Height: 5\' 3"  (160 cm) Weight: 238 lb 1.6 oz (108 kg) IBW/kg (Calculated) : 52.4 Heparin Dosing Weight: 75 kg  Vital Signs: Temp: 98.5 F (36.9 C) (07/05 0733) Temp Source: Oral (07/05 0733) BP: 154/55 mmHg (07/05 0733) Pulse Rate: 67 (07/05 0733)  Labs:  Recent Labs  11/20/15 0405 11/20/15 1800 11/21/15 0503 11/22/15 0540  HGB 7.4* 8.0* 7.9* 8.3*  HCT 23.1* 25.0* 25.0* 26.2*  PLT 293 316 321 293  APTT 39*  --  39* 30  CREATININE 1.11*  --  1.17* 1.22*    Estimated Creatinine Clearance: 44.8 mL/min (by C-G formula based on Cr of 1.22).   Medical History: Past Medical History  Diagnosis Date  . DM (diabetes mellitus) (HCC)   . PAF (paroxysmal atrial fibrillation) (HCC)     a. s/p dccv 04/2014;  b. amio/eliquis;  c. 06/2013 bb/ccb d/c 2/2 symptomatic bradycardia; d. 04/2015 recurrent AF->amio increased/bb resumed.  . Dyslipidemia   . CAD (coronary artery disease)     a. 2000 s/p PCI RCA;  b. 2005 PCI of RCA 2/2 ISR; c. 04/2013 Neg MV;  d. 05/2013 Cath: LM 20, LAD 30p, LCX 20p, OM1 90 small, RCA 57m (PTCA - FFR 0.86), PDA 40.  . Osteoarthritis   . Hypothyroidism     hx  . Rheumatoid arthritis(714.0)   . Obesity   . GERD (gastroesophageal reflux disease)   . Shoulder fracture, left     a. Dr. 72m  . Cerebral infarction (HCC)   . CKD (chronic kidney disease), stage III   . Asthma   . Chronic diastolic CHF (congestive heart failure) (HCC)     a. 10/2013 Echo: EF 60-65%, no rwma, Gr1 DD, mild MR, mildly dil LA.  11/2013 Hypertensive heart disease   . Right rib fracture     a. 04/2015.   Assessment: 81 year old female on chronic apixaban prior to admission for history of atrial fibrillation who was changed  to IV heparin at admission and this was subsequently held due to a drip in hemoglobin and oozing from surgical site.   AC: Hgb has now stabilized and no further bleeding noted. Orders received to restart IV heparin with lower goal of 0.3 to 0.5 (aPTT and heparin levels were correlating prior to stopping heparin initially). Prior heparin rate of 800 units/hr was providing heparin level of 0.53.   ID: Meropenem total abx D#9 for MSSA R-hip infection and HCAP coverage. Afebrile, WBC up 14.9, SCr up 1.22, CrCl~45-50 ml/min (normalized). Dose okay  Keflex/Clinda PTA Zosyn 6/27>>7/1 Vancomycin 6/27>>7/1 * Vanc 1500 mg 6/27 ~ 1500, 6/29 ~ 1500 @ OSH * 6/30 VR - 36 Merrem 7/1>>  Culture @ [facility]: 6/29 MRSA PCR >> neg  Cultures @ 7/29: Blood >> NGTD Hip Cx >> MSSA, Klebsiella, Enterobacter cloacae - R to Ancef, R oxacillin  Goal of Therapy:  Heparin level 0.3 to 0.5 units/ml - confirmed with Dr. Duke Salvia Monitor platelets by anticoagulation protocol: Yes   Plan:  Restart heparin at lower rate of 650 units/hr for lower heparin goal and recent bleeding. No bolus.  Heparin level in 8 hours.  Daily heparin level and CBC while on therapy.   Continue Meropenem 1g IV every 12 hours.  Monitor renal function, clinical status, and LOT  Link Snuffer, PharmD, BCPS Clinical Pharmacist [phone]  11/22/2015,10:49 AM  ADDENDUM:  1900 HL returned at <0.10. No issues with infusion or lines per RN. No reported bleeding.   Plan: -Increase heparin to 750 units/hr. (HL previously 0.53 on 800 units/hr) -8hr HL

## 2015-11-22 NOTE — Clinical Social Work Note (Signed)
Clinical Social Work Assessment  Patient Details  Name: [name] [name] MRN: [id number] Date of Birth: 19-Sep-1934  Date of referral:  11/22/15               Reason for consult:  Facility Placement                Permission sought to share information with:  Facility Sport and exercise psychologist, Family Supports Permission granted to share information::  Yes, Verbal Permission Granted  Name::     Diplomatic Services operational officer::  [facility]  Relationship::  Daughter  Contact Information:  [phone]  Housing/Transportation Living arrangements for the past 2 months:  [place], [place] of Information:  Patient, Adult Children Patient Interpreter Needed:  None Criminal Activity/Legal Involvement Pertinent to Current Situation/Hospitalization:  No - Comment as needed Significant Relationships:  Adult Children Lives with:  Self Do you feel safe going back to the place where you live?  Yes Need for family participation in patient care:  Yes (Comment)  Care giving concerns:  CSW received referral for possible SNF placement at time of discharge. CSW met with patient and spoke with patient's daughter regarding PT recommendation of SNF placement at time of discharge. Per patient's daughter, patient came to the hospital from [facility] and was not there very many days. Patient and patient's daughter would like patient to return to [facility]. Her clothes and things are still there in her room. Patient and patient's daughter expressed understanding of PT recommendation and are agreeable to SNF placement at time of discharge. CSW to continue to follow and assist with discharge planning needs.   Social Worker assessment / plan:  CSW spoke with patient and patient's daughter concerning possibility of rehab at [facility] before returning home.  Employment status:  Retired Nurse, adult PT Recommendations:  [place] / Referral to community resources:  [place]  Patient/Family's Response to care:  Patient and patient's daughter recognize need for rehab before returning home and are agreeable to returning to [facility]. Patient's daughter expressed understanding that there would be a co-pay amount after the first 20 Medicare days are used.   Patient/Family's Understanding of and Emotional Response to Diagnosis, Current Treatment, and Prognosis:  Patient is realistic regarding therapy needs. No questions/concerns about plan or treatment.    Emotional Assessment Appearance:  Appears stated age Attitude/Demeanor/Rapport:  Other (Appropriate) Affect (typically observed):  Accepting, Appropriate Orientation:  Oriented to Place, Oriented to Self, Oriented to  Time Alcohol / Substance use:  Not Applicable Psych involvement (Current and /or in the community):  No (Comment)  Discharge Needs  Concerns to be addressed:  Care Coordination Readmission within the last 30 days:  No Current discharge risk:  None Barriers to Discharge:  Continued Medical Work up   [employer], [place] 11/22/2015, 8:37 AM

## 2015-11-22 NOTE — Progress Notes (Signed)
Physical Therapy Treatment Patient Details Name: [name] [name] MRN: [id number] DOB: [birth year] Today's Date: 11/22/2015    History of Present Illness 81 y.o. female with PMH including recent right hip hemiarthroplasty roughly 10/27/15 (due to hip fracture) complicated by infected surgical site. She was admitted again to [facility] on 6/27 and underwent I&D of the right hip with implantation of spacer and wound vac. Transferred to [facility] 6/30 due to worsening renal failure, bradycardia, hypotension. Initiated CRRT 6/30. Last CRRT 7/2 PMHx-- PAF, CAD, chronic diastolic CHF (EF 15-83% by echo in 07/2015), Stage 3 CKD, COPD, HTN, and HLD    PT Comments    Was able to reach Dr. Renaye Rakers to clarify Rt hip restrictions (see orders). Patient much more alert and following instructions briskly (bed exercises completed). Assisted to reposition pt on left side.   Follow Up Recommendations  SNF;Supervision/Assistance - 24 hour     Equipment Recommendations  Other (comment) (TBA at SNF)    Recommendations for Other Services OT consult     Precautions / Restrictions Precautions Precautions: Other (comment);Fall Precaution Comments: Per Dr. Eulah Pont, may perform AROM as tolerated with activity (don't need to encourage) and no PROM or AAROM RLE Restrictions RLE Weight Bearing: Touchdown weight bearing    Mobility  Bed Mobility Overal bed mobility: Needs Assistance Bed Mobility: Rolling Rolling: Mod assist         General bed mobility comments: roll Lt for positioning; good use of UEs and upper body, most assist for lower body/legs  Transfers                    Ambulation/Gait                 Stairs            Wheelchair Mobility    Modified Rankin (Stroke Patients Only)       Balance                                    Cognition Arousal/Alertness: Awake/alert Behavior During Therapy: WFL for tasks assessed/performed Overall Cognitive Status: No  family/caregiver present to determine baseline cognitive functioning Area of Impairment: Orientation;Attention;Following commands;Awareness;Memory Orientation Level: Time;Situation Current Attention Level: Sustained Memory: Decreased short-term memory Following Commands: Follows one step commands consistently       General Comments: more alert and aware at [facility] hospital; no memory of why she is here    Exercises General Exercises - Lower Extremity Ankle Circles/Pumps: AROM;Both;10 reps Quad Sets: AROM;Both;10 reps Short Arc Quad: AROM;Left;10 reps Heel Slides: AAROM;Left;10 reps (with resisted extension x 5 reps) Other Exercises Other Exercises: hand pumps and elevating hands for edema management Other Exercises: Lt hip extension (modified bridging) x 5    General Comments        Pertinent Vitals/Pain VSS on SDU monitor  Pain Assessment: Faces Faces Pain Scale: Hurts little more Pain Location: Rt hip  Pain Descriptors / Indicators: Operative site guarding;Grimacing Pain Intervention(s): Limited activity within patient's tolerance;Monitored during session;Repositioned    Home Living                      Prior Function            PT Goals (current goals can now be found in the care plan section) Acute Rehab PT Goals Patient Stated Goal: wants to get well enough to have Lt TKR (was scheduled  for this July) Time For Goal Achievement: 12/05/15 Progress towards PT goals:  (Additional goals with updated ortho input)    Frequency  Min 2X/week    PT Plan Current plan remains appropriate    Co-evaluation             End of Session Equipment Utilized During Treatment: Oxygen Activity Tolerance: Patient tolerated treatment well Patient left: in bed;with call bell/phone within reach;with nursing/sitter in room     Time: 0388-8280 PT Time Calculation (min) (ACUTE ONLY): 34 min  Charges:  $Therapeutic Exercise: 23-37 mins                    G Codes:       [name] [name] 18-Dec-2015, 12:28 PM Pager [phone]

## 2015-11-22 NOTE — [place] FL2 (Signed)
[place] MEDICAID FL2 LEVEL OF CARE SCREENING TOOL     IDENTIFICATION  Patient Name: [name] [name] Birthdate: [birth year] Sex: female Admission Date (Current Location): 11/16/2015  [facility] and [place] Number:  [employer] and Address:  [facility] [place], [address], [place], [place] [address]      Provider Number: [id number]  Attending Physician Name and Address:  Lonia Blood, MD  Relative Name and Phone Number:  [name] daughter, [phone]    Current Level of Care: Hospital Recommended Level of Care: Skilled Nursing Facility Prior Approval Number:    Date Approved/Denied:   PASRR Number: [id number]  Discharge Plan: SNF    Current Diagnoses: Patient Active Problem List   Diagnosis Date Noted  . Sepsis, unspecified organism (HCC)   . Postoperative wound infection of right hip   . MSSA infection, non-invasive   . Infection caused by Enterobacter cloacae   . Infection, Klebsiella   . Paroxysmal atrial fibrillation (HCC)   . Acute blood loss anemia   . Neurogenic bladder   . Sinus bradycardia 11/20/2015  . HCAP (healthcare-associated pneumonia)   . Acute renal failure (ARF) (HCC)   . Pressure ulcer 11/17/2015  . Sepsis (HCC) 11/16/2015  . AKI (acute kidney injury) (HCC)   . Metabolic acidosis   . Hypocalcemia   . Acute respiratory failure with hypoxia (HCC)   . Hypertensive heart disease   . Acute on chronic diastolic CHF (congestive heart failure) (HCC)   . Coronary artery disease involving native coronary artery of native heart without angina pectoris   . Persistent atrial fibrillation (HCC)   . Dyspnea 07/18/2015  . Follicular acne 07/07/2015  . Acute diastolic heart failure (HCC) 06/16/2015  . Routine history and physical examination of adult 01/13/2015  . Dermatitis 06/27/2014  . CKD (chronic kidney disease), stage III   . Bradycardia 06/24/2014  . Diabetes mellitus type 2, uncontrolled (HCC) 03/25/2014  .  Depression 03/25/2014  . Medicare annual wellness visit, subsequent 01/10/2014  . Anxiety state 01/10/2014  . Carotid stenosis 11/16/2013  . CVA (cerebral infarction) 10/21/2013  . Screening for breast cancer 01/14/2013  . Poorly controlled type 2 diabetes mellitus with circulatory disorder (HCC) 02/04/2012  . Chronic UTI 06/12/2011  . GERD (gastroesophageal reflux disease) 06/12/2011  . Hyperlipidemia 06/12/2011  . Hypertension 05/17/2011  . Hypothyroidism 03/07/2011  . Chronic back pain 02/21/2011  . Morbid obesity (HCC) 08/17/2008  . CAD (coronary artery disease) 08/17/2008  . Atrial fibrillation (HCC) 08/17/2008  . Osteoarthritis, knee 08/17/2008    Orientation RESPIRATION BLADDER Height & Weight     Self, Time, Place  O2 (Nasal cannula 2L) Continent, Indwelling catheter (Urinary catheter) Weight: 108 kg (238 lb 1.6 oz) Height:  5\' 3"  (160 cm)  BEHAVIORAL SYMPTOMS/MOOD NEUROLOGICAL BOWEL NUTRITION STATUS      Continent (Rectal tube) Diet (Please see DC Summary)  AMBULATORY STATUS COMMUNICATION OF NEEDS Skin   Extensive Assist Verbally                         Personal Care Assistance Level of Assistance  Bathing, Feeding, Dressing Bathing Assistance: Maximum assistance Feeding assistance: Independent Dressing Assistance: Limited assistance     Functional Limitations Info             SPECIAL CARE FACTORS FREQUENCY  PT (By licensed PT)     PT Frequency: 5x/week  Contractures      Additional Factors Info  Code Status, Allergies, Psychotropic, Insulin Sliding Scale Code Status Info: Full Allergies Info: Oxycodone Psychotropic Info: Zoloft Insulin Sliding Scale Info: insulin aspart (novoLOG) injection 0-9 Units       Current Medications (11/22/2015):  This is the current hospital active medication list Current Facility-Administered Medications  Medication Dose Route Frequency Provider Last Rate Last Dose  . albuterol (PROVENTIL) (2.5  MG/3ML) 0.083% nebulizer solution 2.5 mg  2.5 mg Nebulization Q6H PRN Nelda Bucks, MD      . antiseptic oral rinse (CPC / CETYLPYRIDINIUM CHLORIDE 0.05%) solution 7 mL  7 mL Mouth Rinse q12n4p Nelda Bucks, MD   7 mL at 11/20/15 1600  . atorvastatin (LIPITOR) tablet 20 mg  20 mg Oral q1800 Rahul P Desai, PA-C   20 mg at 11/21/15 1816  . chlorhexidine (PERIDEX) 0.12 % solution 15 mL  15 mL Mouth Rinse BID Nelda Bucks, MD   15 mL at 11/21/15 2108  . Darbepoetin Alfa (ARANESP) injection 100 mcg  100 mcg Subcutaneous Q Wed-1800 Zetta Bills, MD      . fentaNYL (SUBLIMAZE) injection 12.5-50 mcg  12.5-50 mcg Intravenous Q2H PRN Zigmund Gottron, MD   50 mcg at 11/21/15 0555  . furosemide (LASIX) tablet 80 mg  80 mg Oral Daily Beryle Lathe, MD   80 mg at 11/21/15 1100  . insulin aspart (novoLOG) injection 0-9 Units  0-9 Units Subcutaneous TID WC Julio Sicks, NP   2 Units at 11/22/15 0747  . levothyroxine (SYNTHROID, LEVOTHROID) tablet 150 mcg  150 mcg Oral QAC breakfast Julio Sicks, NP   150 mcg at 11/22/15 0745  . meropenem (MERREM) 1 g in sodium chloride 0.9 % 100 mL IVPB  1 g Intravenous Q12H Bertram Millard, RPH   1 g at 11/21/15 2108  . ondansetron (ZOFRAN) injection 4 mg  4 mg Intravenous Q6H PRN Roslynn Amble, MD   4 mg at 11/21/15 1415  . pantoprazole (PROTONIX) EC tablet 40 mg  40 mg Oral Q1200 Tammy S Parrett, NP   40 mg at 11/21/15 1132  . sertraline (ZOLOFT) tablet 50 mg  50 mg Oral Daily Tammy S Parrett, NP   50 mg at 11/20/15 1000     Discharge Medications: Please see discharge summary for a list of discharge medications.  Relevant Imaging Results:  Relevant Lab Results:   Additional Information SSN: [id number] [place], [place]

## 2015-11-23 DIAGNOSIS — I272 Other secondary pulmonary hypertension: Secondary | ICD-10-CM

## 2015-11-23 DIAGNOSIS — IMO0002 Reserved for concepts with insufficient information to code with codable children: Secondary | ICD-10-CM | POA: Diagnosis present

## 2015-11-23 DIAGNOSIS — S70259A Superficial foreign body, unspecified hip, initial encounter: Secondary | ICD-10-CM

## 2015-11-23 DIAGNOSIS — I10 Essential (primary) hypertension: Secondary | ICD-10-CM | POA: Diagnosis present

## 2015-11-23 DIAGNOSIS — S70251A Superficial foreign body, right hip, initial encounter: Secondary | ICD-10-CM

## 2015-11-23 DIAGNOSIS — L089 Local infection of the skin and subcutaneous tissue, unspecified: Secondary | ICD-10-CM | POA: Diagnosis present

## 2015-11-23 DIAGNOSIS — N179 Acute kidney failure, unspecified: Secondary | ICD-10-CM | POA: Diagnosis present

## 2015-11-23 DIAGNOSIS — A498 Other bacterial infections of unspecified site: Secondary | ICD-10-CM | POA: Insufficient documentation

## 2015-11-23 DIAGNOSIS — E118 Type 2 diabetes mellitus with unspecified complications: Secondary | ICD-10-CM

## 2015-11-23 DIAGNOSIS — E1165 Type 2 diabetes mellitus with hyperglycemia: Secondary | ICD-10-CM | POA: Diagnosis present

## 2015-11-23 LAB — RENAL FUNCTION PANEL
ALBUMIN: 1.8 g/dL — AB (ref 3.5–5.0)
ANION GAP: 5 (ref 5–15)
BUN: 7 mg/dL (ref 6–20)
CHLORIDE: 102 mmol/L (ref 101–111)
CO2: 32 mmol/L (ref 22–32)
Calcium: 7.8 mg/dL — ABNORMAL LOW (ref 8.9–10.3)
Creatinine, Ser: 0.94 mg/dL (ref 0.44–1.00)
GFR calc Af Amer: >60 mL/min (ref >60)
GFR calc non Af Amer: 57 mL/min — ABNORMAL LOW (ref >60)
GLUCOSE: 166 mg/dL — AB (ref 65–99)
PHOSPHORUS: 1.9 mg/dL — AB (ref 2.5–4.6)
POTASSIUM: 2.9 mmol/L — AB (ref 3.5–5.1)
Sodium: 139 mmol/L (ref 135–145)

## 2015-11-23 LAB — CBC WITH DIFFERENTIAL/PLATELET
BASOS PCT: 0 %
Basophils Absolute: 0 10*3/uL (ref 0.0–0.1)
EOS ABS: 0.2 10*3/uL (ref 0.0–0.7)
Eosinophils Relative: 1 %
HEMATOCRIT: 25.9 % — AB (ref 36.0–46.0)
Hemoglobin: 8.2 g/dL — ABNORMAL LOW (ref 12.0–15.0)
LYMPHS ABS: 1.4 10*3/uL (ref 0.7–4.0)
Lymphocytes Relative: 11 %
MCH: 29.2 pg (ref 26.0–34.0)
MCHC: 31.7 g/dL (ref 30.0–36.0)
MCV: 92.2 fL (ref 78.0–100.0)
MONO ABS: 0.9 10*3/uL (ref 0.1–1.0)
MONOS PCT: 7 %
NEUTROS ABS: 10.2 10*3/uL — AB (ref 1.7–7.7)
Neutrophils Relative %: 81 %
Platelets: 334 10*3/uL (ref 150–400)
RBC: 2.81 MIL/uL — ABNORMAL LOW (ref 3.87–5.11)
RDW: 16.8 % — AB (ref 11.5–15.5)
WBC: 12.7 10*3/uL — ABNORMAL HIGH (ref 4.0–10.5)

## 2015-11-23 LAB — GLUCOSE, CAPILLARY
GLUCOSE-CAPILLARY: 154 mg/dL — AB (ref 65–99)
GLUCOSE-CAPILLARY: 316 mg/dL — AB (ref 65–99)
GLUCOSE-CAPILLARY: 223 mg/dL — AB (ref 65–99)
GLUCOSE-CAPILLARY: 291 mg/dL — AB (ref 65–99)

## 2015-11-23 LAB — PHOSPHORUS: PHOSPHORUS: 2.7 mg/dL (ref 2.5–4.6)

## 2015-11-23 LAB — HEPARIN LEVEL (UNFRACTIONATED)
HEPARIN UNFRACTIONATED: 0.21 [IU]/mL — AB (ref 0.30–0.70)
Heparin Unfractionated: 0.14 IU/mL — ABNORMAL LOW (ref 0.30–0.70)
Heparin Unfractionated: 0.22 IU/mL — ABNORMAL LOW (ref 0.30–0.70)

## 2015-11-23 LAB — POTASSIUM: Potassium: 3.2 mmol/L — ABNORMAL LOW (ref 3.5–5.1)

## 2015-11-23 LAB — MAGNESIUM: Magnesium: 1.3 mg/dL — ABNORMAL LOW (ref 1.7–2.4)

## 2015-11-23 MED ORDER — POTASSIUM CHLORIDE 10 MEQ/100ML IV SOLN
10.0000 meq | INTRAVENOUS | Status: AC
  Administered 2015-11-23 (×5): 10 meq via INTRAVENOUS
  Filled 2015-11-23 (×5): qty 100

## 2015-11-23 MED ORDER — POTASSIUM CHLORIDE 10 MEQ/100ML IV SOLN
10.0000 meq | INTRAVENOUS | Status: AC
  Administered 2015-11-23 (×2): 10 meq via INTRAVENOUS
  Filled 2015-11-23 (×2): qty 100

## 2015-11-23 MED ORDER — POTASSIUM CHLORIDE 10 MEQ/100ML IV SOLN
10.0000 meq | INTRAVENOUS | Status: DC
Start: 2015-11-23 — End: 2015-11-23

## 2015-11-23 MED ORDER — MAGNESIUM SULFATE 50 % IJ SOLN
3.0000 g | Freq: Once | INTRAVENOUS | Status: AC
  Administered 2015-11-23: 3 g via INTRAVENOUS
  Filled 2015-11-23: qty 6

## 2015-11-23 MED ORDER — SODIUM CHLORIDE 0.9 % IV SOLN
600.0000 mg | INTRAVENOUS | Status: DC
  Administered 2015-11-23: 600 mg via INTRAVENOUS
  Filled 2015-11-23 (×2): qty 600

## 2015-11-23 MED ORDER — POTASSIUM PHOSPHATES 15 MMOLE/5ML IV SOLN
30.0000 meq | Freq: Once | INTRAVENOUS | Status: AC
  Administered 2015-11-23: 30 meq via INTRAVENOUS
  Filled 2015-11-23: qty 6.82

## 2015-11-23 NOTE — Progress Notes (Signed)
Inpatient Diabetes Program Recommendations  AACE/ADA: New Consensus Statement on Inpatient Glycemic Control (2015)  Target Ranges:  Prepandial:   less than 140 mg/dL      Peak postprandial:   less than 180 mg/dL (1-2 hours)      Critically ill patients:  140 - 180 mg/dL   Lab Results  Component Value Date   GLUCAP 154* 11/23/2015   HGBA1C 10.5* 10/10/2015    Review of Glycemic Control:  Results for [name] [name], [name] [name] (MRN [id number]) as of 11/23/2015 13:17  Ref. Range 11/22/2015 07:31 11/22/2015 08:00 11/22/2015 12:15 11/22/2015 16:59 11/22/2015 19:15 11/22/2015 21:03 11/23/2015 04:51 11/23/2015 08:03  Glucose-Capillary Latest Ref Range: 65-99 mg/dL 147 (H)  829 (H) 562 (H)  277 (H)  154 (H)   Diabetes history: Type 2 diabetes Outpatient Diabetes medications: Lantus 25 units daily, Novolog 2-10 units tid with meals Current orders for Inpatient glycemic control:  Novolog sensitive tid with meals, Lantus 10 units daily  Inpatient Diabetes Program Recommendations:    May consider increasing Lantus to 15 units daily.  Also may consider adding Novolog meal coverage 3 units tid with meals-Hold if patient eats less than 50%.  Thanks, Beryl Meager, RN, BC-ADM Inpatient Diabetes Coordinator Pager [phone] (8-5p)

## 2015-11-23 NOTE — Clinical Documentation Improvement (Addendum)
Hospitalist and/or Associates  Please document query responses in the progress notes and discharge summary, not on the CDI BPA form in [facility]. Thank you!  "Pressure Ulcer" is documented on the current hospital problem list.  CMS requires that an attending provider document the Location and Present on Admission Status for pressure ulcers.  Stage II Sacral Ulcer is documented by Nursing in the [facility] Flowsheets  If you agree with the nursing assessment, please document the Location of the pressure ulcer and the POA status.   Please exercise your independent, professional judgment when responding. A specific answer is not anticipated or expected.   Thank You, Jerral Ralph  RN BSN CCDS [phone] Health Information Management [place]

## 2015-11-23 NOTE — Progress Notes (Signed)
ANTICOAGULATION CONSULT NOTE - Follow Up Consult  Pharmacy Consult for Heparin  Indication: atrial fibrillation  Allergies  Allergen Reactions  . Oxycodone Nausea And Vomiting    Patient Measurements: Height: 5\' 3"  (160 cm) Weight: 231 lb (104.781 kg) IBW/kg (Calculated) : 52.4 Vital Signs: Temp: 98.4 F (36.9 C) (07/06 2315) Temp Source: Oral (07/06 2315) BP: 138/54 mmHg (07/06 2315) Pulse Rate: 62 (07/06 2315)  Labs:  Recent Labs  11/21/15 0503 11/22/15 0540  11/23/15 0451 11/23/15 1445 11/23/15 2253  HGB 7.9* 8.3*  --  8.2*  --   --   HCT 25.0* 26.2*  --  25.9*  --   --   PLT 321 293  --  334  --   --   APTT 39* 30  --   --   --   --   HEPARINUNFRC  --   --   < > 0.14* 0.21* 0.22*  CREATININE 1.17* 1.22*  --  0.94  --   --   < > = values in this interval not displayed.  Estimated Creatinine Clearance: 57.2 mL/min (by C-G formula based on Cr of 0.94).  Assessment: Heparin for afib while apixaban on hold, HL is low tonight, no issues per RN.   Goal of Therapy:  Heparin level 0.3-0.5 units/ml Monitor platelets by anticoagulation protocol: Yes   Plan:  -Increase heparin to 950 units/hr -0800 HL  01/24/16 11/23/2015,11:39 PM

## 2015-11-23 NOTE — Care Management Important Message (Signed)
Important Message  Patient Details  Name: [name] [name] MRN: [id number] Date of Birth: 1934-08-12   Medicare Important Message Given:  Yes    Madilyne Tadlock, Stephan Minister 11/23/2015, 10:19 AM

## 2015-11-23 NOTE — Progress Notes (Signed)
Physical Therapy Treatment Patient Details Name: [name] [name] MRN: [id number] DOB: [birth year] Today's Date: 11/23/2015    History of Present Illness 81 y.o. female with PMH including recent right hip hemiarthroplasty roughly 10/27/15 (due to hip fracture) complicated by infected surgical site. She was admitted again to [facility] on 6/27 and underwent I&D of the right hip with implantation of spacer and wound vac. Transferred to [facility] 6/30 due to worsening renal failure, bradycardia, hypotension. Initiated CRRT 6/30. Last CRRT 7/2 PMHx-- PAF, CAD, chronic diastolic CHF (EF 43-32% by echo in 07/2015), Stage 3 CKD, COPD, HTN, and HLD    PT Comments    Pt admitted with above diagnosis. Pt currently with functional limitations due to balance and endurance deficits. Pt able to sit EOB for 10 min and perform exercises.  Needs mod assist for bed mobility. Will continue acute PT.   Pt will benefit from skilled PT to increase their independence and safety with mobility to allow discharge to the venue listed below.    Follow Up Recommendations  SNF;Supervision/Assistance - 24 hour     Equipment Recommendations  Other (comment) (TBA at SNF)    Recommendations for Other Services       Precautions / Restrictions Precautions Precautions: Other (comment);Fall Precaution Comments: Per Dr. Eulah Pont, may perform AROM as tolerated with activity (don't need to encourage) and no PROM or AAROM RLE Restrictions Weight Bearing Restrictions: Yes RLE Weight Bearing: Touchdown weight bearing    Mobility  Bed Mobility Overal bed mobility: Needs Assistance Bed Mobility: Rolling;Sidelying to Sit Rolling: Mod assist Sidelying to sit: Mod assist;+2 for physical assistance       General bed mobility comments: Pt needed assist for elevation of trunk and for LEs.Used pad to get pts hips to EOB.   Transfers                    Ambulation/Gait                 Stairs            Wheelchair  Mobility    Modified Rankin (Stroke Patients Only)       Balance Overall balance assessment: Needs assistance Sitting-balance support: Bilateral upper extremity supported;Feet supported Sitting balance-Leahy Scale: Poor Sitting balance - Comments: Needed assist to sit initially with posterior lean but eventually min guard assist.  Sat 10 min and performed some exercises. Postural control: Posterior lean                          Cognition Arousal/Alertness: Awake/alert Behavior During Therapy: WFL for tasks assessed/performed Overall Cognitive Status: No family/caregiver present to determine baseline cognitive functioning Area of Impairment: Orientation;Attention;Following commands;Awareness;Memory Orientation Level: Time;Situation Current Attention Level: Sustained Memory: Decreased short-term memory Following Commands: Follows one step commands consistently            Exercises General Exercises - Lower Extremity Ankle Circles/Pumps: AROM;Both;10 reps Long Arc Quad: AROM;Both;10 reps;Seated    General Comments        Pertinent Vitals/Pain Pain Assessment: Faces Faces Pain Scale: Hurts even more Pain Location: right hip Pain Descriptors / Indicators: Operative site guarding Pain Intervention(s): Limited activity within patient's tolerance;Monitored during session;Repositioned  VSS    Home Living                      Prior Function            PT Goals (current goals  can now be found in the care plan section) Progress towards PT goals: Progressing toward goals    Frequency  Min 2X/week    PT Plan Current plan remains appropriate    Co-evaluation             End of Session Equipment Utilized During Treatment: Gait belt;Oxygen Activity Tolerance: Patient limited by fatigue Patient left: in bed;with call bell/phone within reach     Time: 8756-4332 PT Time Calculation (min) (ACUTE ONLY): 19 min  Charges:  $Therapeutic  Activity: 8-22 mins                    G CodesBerline Lopes 12-05-2015, 4:49 PM [employer] Acute Rehabilitation [phone] (pager)

## 2015-11-23 NOTE — Progress Notes (Signed)
PROGRESS NOTE    [name] [facility]HLS  WIO:[id number] DOB: February 20, 1935 DOA: 11/16/2015 PCP: [name] Dove, MD   Brief Narrative:  81 y.o. WF PMHx CVA, DM Type 2 uncontrolled with complication,PAF (paroxysmal atrial fibrillation), CAD native artery,Chronic Diastolic CHF, HTN, Asthma, HLD, hypothyroidism, RA, CKD stage III,  Recent Rt Hip Hemiarthroplasty roughly 10/27/15 complicated by infected surgical site. She was admitted again to [facility] on 6/27 and underwent I&D of the right hip with implantation of spacer and wound vac and was started on empiric vanc / zosyn. In addition, she had severe bradycardia with HR in the 20's. She was seen in consultation by cardiology who felt that bradycardia was related to amiodarone and metoprolol; therefore, these were discontinued and pt was started on Isuprel. Her hospital stay was complicated by anemia for which she received 4u PRBC transfusion along with worsening renal failure.  Patient with resolving bradycardia from junctional rhythm and now normal rate and rhythm. Weaned off dopamine 7/1 . Orthopedics following given septic joint with wound VAC and spacer in place. Nephrology following for ongoing renal failure. UOP good, scr now at baseline . CRRT stopped yesterday. Has anemia for which heparin drip has been dc'd. Pt has been stable in ICU off CRRT since 7/2. Plan for transfer to SDU today and will be on [facility] service starting 7/4. PCCM will sign off starting 7/4. I discussed case with Dr. Sharon Seller.   Assessment & Plan:   Active Problems:   Sepsis (HCC)   AKI (acute kidney injury) (HCC)   Metabolic acidosis   Hypocalcemia   Acute respiratory failure with hypoxia (HCC)   Pressure ulcer   Acute renal failure (ARF) (HCC)   HCAP (healthcare-associated pneumonia)   Sinus bradycardia   Sepsis, unspecified organism (HCC)   Postoperative wound infection of right hip   MSSA infection, non-invasive   Infection caused by Enterobacter cloacae  Infection, Klebsiella   Paroxysmal atrial fibrillation (HCC)   Acute blood loss anemia   Neurogenic bladder   Foreign body of hip with infection   Klebsiella infection   Essential hypertension   Pulmonary hypertension (HCC)   Acute renal failure (HCC)   Uncontrolled type 2 diabetes mellitus with complication (HCC)  Sepsis - Due to infected Right hip positive-MSSA, Enterobacter, Klebsiella  - S/P I/D 6/27 w/ spacer & wound vac - S/P R Hip Hemiarthroplasty 6/9. -Continue current antibiotics -Orthopedics evaluated at time of admit but not actively following  -Wound Care C/S for Wound Vac -PT Recommends SNF  -Reconsult Orthopedics Surgery in the a.m. how long do the antibiotic spacers remain? How long total antibiotics? Parameters for rehabilitation?  Possible LLL HCAP/Acute Respiratory Failure with hypoxia -Titrate O2 to maintain SPO2 > 92% - Most organisms covered by Merrem  Bradycardia  - Reportedly 3rd degree AVB at [place]. -Resolved  PAF  -Currently in NSR/rate controlled  -Hold on restarting Amiodarone  -Heparin restarted on 7/5 H/H stable. If continues stable overnight restart Eliquis -Transfuse for hemoglobin<8]  Recent Labs Lab 11/20/15 0405 11/20/15 1800 11/21/15 0503 11/22/15 0540 11/23/15 0451  HGB 7.4* 8.0* 7.9* 8.3* 8.2*  -TTE on 7/2: Compared to the prior study in 07/2015, the LVEF is higher at 60-65%. There may be mild aortic stenosis. The mitral annulus is calcified.  Pulmonary arteries: PA peak pressure: 57 mm Hg (S).  HTN -controlled   Pulmonary hypertension  See PAF  CAD native artery  See PAF  HLD -Lipitor 20mg  Daily  Acute Renal Failure(baseline Cr 1.11) Lab Results  Component  Value Date   CREATININE 0.94 11/23/2015   CREATININE 1.22* 11/22/2015   CREATININE 1.17* 11/21/2015  -Renal ultrasound-negative Hydronephrosis  - CRRT started 7/1 and ended on 7/2; 7/6 HD cath removed   -Strict in and out Since admission -5.3 L -Daily  weight Filed Weights   11/22/15 0500 11/22/15 1227 11/23/15 0300  Weight: 108 kg (238 lb 1.6 oz) 107.956 kg (238 lb) 104.781 kg (231 lb)    Acute blood loss anemia  -Transfused 4 units PRBC at [facility]?  -7/2 transfuse 1 unit PRBC  Recent Labs Lab 11/20/15 0405 11/20/15 1800 11/21/15 0503 11/22/15 0540 11/23/15 0451  HGB 7.4* 8.0* 7.9* 8.3* 8.2*   DM Type 2-Uncontrolled with Complications -5/23 hemoglobin A1c= 10.5 -Sensitive SSI  Hypothyroidism - TSH 1.861.  Hypokalemia -Potassium goal >4 -Potassium IV 50 mEq -At 1400 patient remains hypokalemic;Another dose potassium IV 50 mEq  Hypomagnesemia -Magnesium goal> 2 -Magnesium IV 3 gm  Hypophosphatemia -K-Phos 30 mEq -1400 phosphorus level WNL  Anxiety/Depression  Neurogenic Bladder - Chronic foley placed at [facility].   DVT prophylaxis: Heparin drip Code Status: Full Family Communication: None available Disposition Plan: Per orthopedic surgery     Consultants:  Dr.James Deterding nephrology Dr.Peter M [place] cardiology   Procedures/Significant Events:  TTE 07/20/15:Atrial fibrillation. LVEF 55-60%. Normal wall motion. Unable to assess diastolic function. Mild mitral regurgitation. RV normal in size and function. Pulmonary artery systolic pressure 38 mmHg. 06/09 - right hip hemiarthroplasty 06/27 - admitted with infected right hip to [facility]. Taken to OR for I&D and spacer implantation. 06/29 - Worsened renal function. [facility]transferred to [facility] for further evaluation and management. -Transfused 4 units PRBC?? EKG 6/29: Junctional bradycardia. Port CXR 6/30: Right hilar fullness with silhouetting of the left hemidiaphragm suggestive of retrocardiac opacity. Globular heart with suggestion of cardiomegaly. 7/1 CRRT started >> 7/2 TTE on 7/2: Compared to the prior study in 07/2015, the LVEF is higher at 60-65%. There may be mild aortic stenosis. The mitral annulus is calcified. Pulmonary  arteries: PA peak pressure: 57 mm Hg (S). 7/2 transfuse 1 units PRBC    Cultures Blood Ctx x2 6/29>> R Hip Ctx 6/29>> (poss Staph aureus & 2 GNR)>Staph Aureus(MSSA) , Enterobacter , Klebsiella PNA    Antimicrobials: Tobramycin+ vancomycin cement 6/29>> Vancomycin 06/27>>7/1 Zozyn 06/27>> 7/1 Merrem 7/1 >> Rifampin7/6>>   Devices Wound VAC in place right thigh Drain in place right thigh   LINES / TUBES:  R IJ CVL ([place]) > L radial A line ([place]) > FOLEY ([place]) > [facility] HD Cath 7/1>>7/6   Continuous Infusions: . heparin 850 Units/hr (11/23/15 1530)     Subjective: 7/6  A/O 4, negative N/V, negative right hip pain. States negative O2 at home.   Objective: Filed Vitals:   11/23/15 0300 11/23/15 0801 11/23/15 1209 11/23/15 1614  BP: 147/59 154/57 146/50 137/60  Pulse: 63 61 63 64  Temp: 98.1 F (36.7 C) 98.2 F (36.8 C) 98 F (36.7 C) 98.5 F (36.9 C)  TempSrc: Oral Oral Oral Oral  Resp: 22 19 17 21   Height:      Weight: 104.781 kg (231 lb)     SpO2: 95% 94% 96% 96%    Intake/Output Summary (Last 24 hours) at 11/23/15 1907 Last data filed at 11/23/15 1848  Gross per 24 hour  Intake 2236.82 ml  Output   2525 ml  Net -288.18 ml   Filed Weights   11/22/15 0500 11/22/15 1227 11/23/15 0300  Weight: 108 kg (238 lb 1.6  oz) 107.956 kg (238 lb) 104.781 kg (231 lb)    Examination:  General: A/O 4, NAD, No acute respiratory distress Eyes: negative scleral hemorrhage, negative anisocoria, negative icterus ENT: Negative Runny nose, negative gingival bleeding, Neck:  Negative scars, masses, torticollis, lymphadenopathy, JVD, right IJ CVL in place Covered and clean, left IJ HD cath covered and clean but appears to be backing out.  Lungs: Clear to auscultation bilaterally without wheezes or crackles Cardiovascular: Regular rate and rhythm without murmur gallop or rub normal S1 and S2 Abdomen: Morbidly obese, nondistended, positive soft, bowel sounds,  no rebound, no ascites, no appreciable mass Extremities: right thigh swollen, nontender, wound VAC in place on lateral aspect of thigh draining dark blood, Negative sign of infection. Skin: RLL surgical incision right lateral thigh with wound VAC in drain in place. Old RLL knee surgical incision negative sign of infection.  Psychiatric:  Negative depression, negative anxiety, negative fatigue, negative mania  Central nervous system:  Cranial nerves II through XII intact, tongue/uvula midline, all extremities muscle strength 5/5, sensation intact throughout, negative dysarthria, negative expressive aphasia, negative receptive aphasia.  .     Data Reviewed: Care during the described time interval was provided by me .  I have reviewed this patient's available data, including medical history, events of note, physical examination, and all test results as part of my evaluation. I have personally reviewed and interpreted all radiology studies.  CBC:  Recent Labs Lab 11/16/15 2325  11/18/15 0501  11/20/15 0405 11/20/15 1800 11/21/15 0503 11/22/15 0540 11/23/15 0451  WBC 16.7*  < > 17.4*  < > 10.4 12.5* 12.7* 14.9* 12.7*  NEUTROABS 15.5*  --  14.1*  --   --   --   --  12.6* 10.2*  HGB 8.9*  < > 7.3*  < > 7.4* 8.0* 7.9* 8.3* 8.2*  HCT 27.2*  < > 22.4*  < > 23.1* 25.0* 25.0* 26.2* 25.9*  MCV 88.3  < > 87.2  < > 90.9 91.2 91.9 94.6 92.2  PLT 344  < > 413*  < > 293 316 321 293 334  < > = values in this interval not displayed. Basic Metabolic Panel:  Recent Labs Lab 11/16/15 2325  11/18/15 0501  11/19/15 0445 11/20/15 0405 11/21/15 0503 11/22/15 0540 11/23/15 0451 11/23/15 1445  NA 127*  < >  --   < > 135 142 139 139 139  --   K 5.3*  < >  --   < > 3.8 3.8 4.0 3.8 2.9* 3.2*  CL 103  < >  --   < > 102 99* 107 103 102  --   CO2 17*  < >  --   < > 26 25 23 24  32  --   GLUCOSE 277*  < >  --   < > 131* 129* 135* 185* 166*  --   BUN 45*  < >  --   < > 12 7 8 10 7   --   CREATININE 4.51*  <  >  --   < > 1.23* 1.11* 1.17* 1.22* 0.94  --   CALCIUM 6.8*  < >  --   < > 7.0* 8.5* 7.9* 8.1* 7.8*  --   MG 1.1*  --  1.3*  --  2.0  --   --  1.5*  --  1.3*  PHOS 5.7*  --   --   < > 2.4*  --  2.4* 2.3* 1.9* 2.7  < > =  values in this interval not displayed. GFR: Estimated Creatinine Clearance: 57.2 mL/min (by C-G formula based on Cr of 0.94). Liver Function Tests:  Recent Labs Lab 11/16/15 2325 11/18/15 0502 11/18/15 1612 11/21/15 0503 11/22/15 0540 11/23/15 0451  AST 15  --   --   --   --   --   ALT 8*  --   --   --   --   --   ALKPHOS 108  --   --   --   --   --   BILITOT 0.5  --   --   --   --   --   PROT 5.0*  --   --   --   --   --   ALBUMIN 1.5* 1.6* 1.5* 1.7* 1.9* 1.8*   No results for input(s): LIPASE, AMYLASE in the last 168 hours. No results for input(s): AMMONIA in the last 168 hours. Coagulation Profile:  Recent Labs Lab 11/16/15 2325  INR 1.36   Cardiac Enzymes:  Recent Labs Lab 11/16/15 2325  TROPONINI 0.06*   BNP (last 3 results)  Recent Labs  06/15/15 1100 07/18/15 1429  PROBNP 391.0* 321.0*   HbA1C: No results for input(s): HGBA1C in the last 72 hours. CBG:  Recent Labs Lab 11/22/15 1659 11/22/15 2103 11/23/15 0803 11/23/15 1208 11/23/15 1615  GLUCAP 169* 277* 154* 316* 223*   Lipid Profile: No results for input(s): CHOL, HDL, LDLCALC, TRIG, CHOLHDL, LDLDIRECT in the last 72 hours. Thyroid Function Tests: No results for input(s): TSH, T4TOTAL, FREET4, T3FREE, THYROIDAB in the last 72 hours. Anemia Panel:  Recent Labs  11/22/15 0800  FERRITIN 216  TIBC 197*  IRON 37   Urine analysis:    Component Value Date/Time   COLORURINE AMBER* 11/17/2015 1140   COLORURINE Yellow 05/16/2014 1433   APPEARANCEUR TURBID* 11/17/2015 1140   APPEARANCEUR Hazy 05/16/2014 1433   LABSPEC 1.033* 11/17/2015 1140   LABSPEC 1.006 05/16/2014 1433   PHURINE 5.0 11/17/2015 1140   PHURINE 5.0 05/16/2014 1433   GLUCOSEU NEGATIVE 11/17/2015 1140    GLUCOSEU Negative 05/16/2014 1433   HGBUR LARGE* 11/17/2015 1140   HGBUR Negative 05/16/2014 1433   BILIRUBINUR SMALL* 11/17/2015 1140   BILIRUBINUR Negative 05/16/2014 1433   BILIRUBINUR Negative 10/06/2013 1353   KETONESUR 15* 11/17/2015 1140   KETONESUR Negative 05/16/2014 1433   PROTEINUR 30* 11/17/2015 1140   PROTEINUR Negative 05/16/2014 1433   PROTEINUR 100 10/06/2013 1353   UROBILINOGEN 0.2 06/25/2014 1725   UROBILINOGEN 0.2 10/06/2013 1353   NITRITE NEGATIVE 11/17/2015 1140   NITRITE Negative 05/16/2014 1433   NITRITE Negative 10/06/2013 1353   LEUKOCYTESUR LARGE* 11/17/2015 1140   LEUKOCYTESUR Negative 05/16/2014 1433   Sepsis Labs: (procalcitonin:4,lacticidven:4)  ) Recent Results (from the past 240 hour(s))  MRSA PCR Screening     Status: None   Collection Time: 11/16/15 10:06 PM  Result Value Ref Range Status   MRSA by PCR NEGATIVE NEGATIVE Final    Comment:        The GeneXpert MRSA Assay (FDA approved for NASAL specimens only), is one component of a comprehensive MRSA colonization surveillance program. It is not intended to diagnose MRSA infection nor to guide or monitor treatment for MRSA infections.          Radiology Studies: No results found.      Scheduled Meds: . amLODipine  10 mg Oral Daily  . antiseptic oral rinse  7 mL Mouth Rinse q12n4p  . atorvastatin  20  mg Oral q1800  . chlorhexidine  15 mL Mouth Rinse BID  . darbepoetin (ARANESP) injection - NON-DIALYSIS  100 mcg Subcutaneous Q Wed-1800  . feeding supplement  1 Container Oral TID WC  . feeding supplement (PRO-STAT SUGAR FREE 64)  30 mL Oral TID  . furosemide  80 mg Oral Daily  . insulin aspart  0-9 Units Subcutaneous TID WC  . insulin glargine  10 Units Subcutaneous QHS  . levothyroxine  150 mcg Oral QAC breakfast  . magnesium sulfate 1 - 4 g bolus IVPB  3 g Intravenous Once  . meropenem (MERREM) IV  1 g Intravenous Q12H  . pantoprazole  40 mg Oral Q1200  .  potassium chloride  10 mEq Intravenous Q1 Hr x 5  . rifampin (RIFADIN) IVPB  600 mg Intravenous Q24H  . sertraline  50 mg Oral Daily   Continuous Infusions: . heparin 850 Units/hr (11/23/15 1530)     LOS: 7 days    Time spent: 40 minutes    WOODS, Roselind Messier, MD [facility] Pager [phone]   If 7PM-7AM, please contact night-coverage [email or web address] Password [email or web address] 11/23/2015, 7:07 PM

## 2015-11-23 NOTE — Progress Notes (Addendum)
ANTICOAGULATION CONSULT NOTE - Follow Up Consult  Pharmacy Consult for Heparin Indication: atrial fibrillation  Allergies  Allergen Reactions  . Oxycodone Nausea And Vomiting    Patient Measurements: Height: 5\' 3"  (160 cm) Weight: 231 lb (104.781 kg) IBW/kg (Calculated) : 52.4 Heparin Dosing Weight: 75 kg  Vital Signs: Temp: 98 F (36.7 C) (07/06 1209) Temp Source: Oral (07/06 1209) BP: 146/50 mmHg (07/06 1209) Pulse Rate: 63 (07/06 1209)  Labs:  Recent Labs  11/21/15 0503 11/22/15 0540 11/22/15 1915 11/23/15 0451 11/23/15 1445  HGB 7.9* 8.3*  --  8.2*  --   HCT 25.0* 26.2*  --  25.9*  --   PLT 321 293  --  334  --   APTT 39* 30  --   --   --   HEPARINUNFRC  --   --  <0.10* 0.14* 0.21*  CREATININE 1.17* 1.22*  --  0.94  --     Estimated Creatinine Clearance: 57.2 mL/min (by C-G formula based on Cr of 0.94).   Medications:  Heparin at 800 units/hr (8 mL/hr)  Assessment: 81 year old female on IV heparin for atrial fibrillation while Apixaban on hold. Heparin level is 0.21 on 800 units/hr  - slightly below goal.  H/H is low but stable.  No reports of bleeding.   Goal of Therapy:  Heparin level 0.3 to 0.5 units/ml Monitor platelets by anticoagulation protocol: Yes   Plan:  Increase heparin to 850 units/hr (8.5 mL/hr).  Recheck heparin level in 8 hours.  Daily heparin level and CBC while on therapy.   72, PharmD, BCPS Clinical Pharmacist [phone]  11/23/2015,3:15 PM

## 2015-11-23 NOTE — Progress Notes (Signed)
ANTICOAGULATION CONSULT NOTE - Follow Up Consult  Pharmacy Consult for heparin Indication: atrial fibrillation  Labs:  Recent Labs  11/21/15 0503 11/22/15 0540 11/22/15 1915 11/23/15 0451  HGB 7.9* 8.3*  --  8.2*  HCT 25.0* 26.2*  --  25.9*  PLT 321 293  --  334  APTT 39* 30  --   --   HEPARINUNFRC  --   --  <0.10* 0.14*  CREATININE 1.17* 1.22*  --   --      Assessment: 80yo female subtherapeutic on heparin after rate change.  Goal of Therapy:  Heparin level 0.3-0.5 units/ml   Plan:  Will increase heparin gtt very slightly to 800 units/hr since previously at this rate level was just above 0.5 (upper end of new goal) and check level in 8hr.  Vernard Gambles, PharmD, BCPS  11/23/2015,6:15 AM

## 2015-11-23 NOTE — Progress Notes (Signed)
PT Cancellation Note  Patient Details Name: [name] [name] MRN: [id number] DOB: 08/16/1934   Cancelled Treatment:    Reason Eval/Treat Not Completed: Medical issues which prohibited therapy   RN noticed pt's Rt IJ line has pulled partially out and needs to be assessed/redressed. Will attempt to see later today   Asah Lamay 11/23/2015, 10:36 AM  Pager [phone]

## 2015-11-24 ENCOUNTER — Other Ambulatory Visit: Payer: Self-pay

## 2015-11-24 DIAGNOSIS — Y792 Prosthetic and other implants, materials and accessory orthopedic devices associated with adverse incidents: Secondary | ICD-10-CM

## 2015-11-24 DIAGNOSIS — B9561 Methicillin susceptible Staphylococcus aureus infection as the cause of diseases classified elsewhere: Secondary | ICD-10-CM

## 2015-11-24 DIAGNOSIS — B9689 Other specified bacterial agents as the cause of diseases classified elsewhere: Secondary | ICD-10-CM

## 2015-11-24 DIAGNOSIS — M00051 Staphylococcal arthritis, right hip: Secondary | ICD-10-CM

## 2015-11-24 DIAGNOSIS — T8451XA Infection and inflammatory reaction due to internal right hip prosthesis, initial encounter: Secondary | ICD-10-CM

## 2015-11-24 LAB — GLUCOSE, CAPILLARY
GLUCOSE-CAPILLARY: 178 mg/dL — AB (ref 65–99)
GLUCOSE-CAPILLARY: 270 mg/dL — AB (ref 65–99)
Glucose-Capillary: 183 mg/dL — ABNORMAL HIGH (ref 65–99)
Glucose-Capillary: 274 mg/dL — ABNORMAL HIGH (ref 65–99)

## 2015-11-24 LAB — CBC
HCT: 24.8 % — ABNORMAL LOW (ref 36.0–46.0)
Hemoglobin: 8.2 g/dL — ABNORMAL LOW (ref 12.0–15.0)
MCH: 30.6 pg (ref 26.0–34.0)
MCHC: 33.1 g/dL (ref 30.0–36.0)
MCV: 92.5 fL (ref 78.0–100.0)
PLATELETS: 311 10*3/uL (ref 150–400)
RBC: 2.68 MIL/uL — AB (ref 3.87–5.11)
RDW: 17.2 % — ABNORMAL HIGH (ref 11.5–15.5)
WBC: 13.7 10*3/uL — ABNORMAL HIGH (ref 4.0–10.5)

## 2015-11-24 LAB — TROPONIN I
TROPONIN I: 0.03 ng/mL — AB (ref <0.03)
Troponin I: 0.03 ng/mL (ref <0.03)
Troponin I: 0.05 ng/mL (ref <0.03)

## 2015-11-24 LAB — PHOSPHORUS: Phosphorus: 2.3 mg/dL — ABNORMAL LOW (ref 2.5–4.6)

## 2015-11-24 LAB — MAGNESIUM: MAGNESIUM: 1.6 mg/dL — AB (ref 1.7–2.4)

## 2015-11-24 LAB — HEPARIN LEVEL (UNFRACTIONATED): HEPARIN UNFRACTIONATED: 0.4 [IU]/mL (ref 0.30–0.70)

## 2015-11-24 MED ORDER — APIXABAN 5 MG PO TABS
5.0000 mg | ORAL_TABLET | Freq: Two times a day (BID) | ORAL | Status: DC
  Administered 2015-11-24 – 2015-11-27 (×7): 5 mg via ORAL
  Filled 2015-11-24 (×7): qty 1

## 2015-11-24 MED ORDER — INSULIN GLARGINE 100 UNIT/ML ~~LOC~~ SOLN
18.0000 [IU] | Freq: Every day | SUBCUTANEOUS | Status: DC
  Administered 2015-11-24 – 2015-11-26 (×3): 18 [IU] via SUBCUTANEOUS
  Filled 2015-11-24 (×4): qty 0.18

## 2015-11-24 MED ORDER — NITROGLYCERIN 0.4 MG SL SUBL
0.4000 mg | SUBLINGUAL_TABLET | SUBLINGUAL | Status: DC | PRN
  Administered 2015-11-24 – 2015-11-27 (×6): 0.4 mg via SUBLINGUAL
  Filled 2015-11-24 (×5): qty 1

## 2015-11-24 MED ORDER — MAGNESIUM SULFATE 2 GM/50ML IV SOLN
2.0000 g | Freq: Once | INTRAVENOUS | Status: AC
  Administered 2015-11-24: 2 g via INTRAVENOUS
  Filled 2015-11-24 (×2): qty 50

## 2015-11-24 MED ORDER — DEXTROSE 5 % IV SOLN
2.0000 g | INTRAVENOUS | Status: DC
  Administered 2015-11-24 – 2015-11-27 (×4): 2 g via INTRAVENOUS
  Filled 2015-11-24 (×6): qty 2

## 2015-11-24 MED ORDER — MORPHINE SULFATE (PF) 2 MG/ML IV SOLN
2.0000 mg | Freq: Once | INTRAVENOUS | Status: AC
  Administered 2015-11-24: 2 mg via INTRAVENOUS
  Filled 2015-11-24: qty 1

## 2015-11-24 NOTE — Consult Note (Signed)
[facility]    Date of Admission:  11/16/2015   Total days of antibiotics 11        Day 8 meropenem               Reason for Consult: Polymicrobial prosthetic joint infection of right hip    Referring Physician: Dr. 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) Active Problems:   Postoperative wound infection of right hip   MSSA infection, non-invasive   Infection caused by Enterobacter cloacae   Sepsis (HCC)   AKI (acute kidney injury) (HCC)   Metabolic acidosis   Hypocalcemia   Acute respiratory failure with hypoxia (HCC)   Pressure ulcer   Acute renal failure (ARF) (HCC)   HCAP (healthcare-associated pneumonia)   Sinus bradycardia   Paroxysmal atrial fibrillation (HCC)   Acute blood loss anemia   Neurogenic bladder   Foreign body of hip with infection   Essential hypertension   Pulmonary hypertension (HCC)   Acute renal failure (HCC)   Uncontrolled type 2 diabetes mellitus with complication (HCC)   . amLODipine  10 mg Oral Daily  . antiseptic oral rinse  7 mL Mouth Rinse q12n4p  . apixaban  5 mg Oral BID  . atorvastatin  20 mg Oral q1800  . chlorhexidine  15 mL Mouth Rinse BID  . darbepoetin (ARANESP) injection - NON-DIALYSIS  100 mcg Subcutaneous Q Wed-1800  . feeding supplement  1 Container Oral TID WC  . feeding supplement (PRO-STAT SUGAR FREE 64)  30 mL Oral TID  . furosemide  80 mg Oral Daily  . insulin aspart  0-9 Units Subcutaneous TID WC  . insulin glargine  18 Units Subcutaneous QHS  . levothyroxine  150 mcg Oral QAC breakfast  . meropenem (MERREM) IV  1 g Intravenous Q12H  . pantoprazole  40 mg Oral Q1200  . sertraline  50 mg Oral Daily    Recommendations: 1. Narrow antibiotic therapy to IV ceftriaxone and plan on a total of 6 weeks of therapy through 12/26/2015 2. Convert to PICC before discharge 3. Please call me for any ID questions this weekend  Assessment: She has a  polymicrobial right hip infection with MSSA and Enterobacter. I feel the best agent would be to use high-dose ceftriaxone to complete 6 weeks of therapy.    HPI: [name] [name] is a 81 y.o. female who underwent right prosthetic hip implantation in early June after she suffered a hip fracture. She developed a postoperative wound infection and was readmitted to [facility] on 11/14/2015. She underwent surgery to debride the wound. All hardware was removed and a hip spacer was placed. Operative cultures grew MSSA and Enterobacter. One of 2 admission blood cultures there grew coagulase-negative staph which was probably an insignificant contaminant. She was in septic shock initially and was transferred here. She is doing much better now.   Review of Systems: Review of Systems  Constitutional: Positive for malaise/fatigue. Negative for fever, chills and diaphoresis.  Respiratory: Negative for cough, sputum production and shortness of breath.   Cardiovascular: Negative for chest pain.  Gastrointestinal: Negative for heartburn, nausea, vomiting, abdominal pain and diarrhea.  Musculoskeletal: Positive for joint pain.  Skin: Negative for rash.  Neurological: Positive for weakness. Negative for headaches.    Past Medical History  Diagnosis Date  . DM (diabetes mellitus) (HCC)   . PAF (paroxysmal atrial fibrillation) (HCC)  a. s/p dccv 04/2014;  b. amio/eliquis;  c. 06/2013 bb/ccb d/c 2/2 symptomatic bradycardia; d. 04/2015 recurrent AF->amio increased/bb resumed.  . Dyslipidemia   . CAD (coronary artery disease)     a. 2000 s/p PCI RCA;  b. 2005 PCI of RCA 2/2 ISR; c. 04/2013 Neg MV;  d. 05/2013 Cath: LM 20, LAD 30p, LCX 20p, OM1 90 small, RCA 64m (PTCA - FFR 0.86), PDA 40.  . Osteoarthritis   . Hypothyroidism     hx  . Rheumatoid arthritis(714.0)   . Obesity   . GERD (gastroesophageal reflux disease)   . Shoulder fracture, left     a. Dr. Martha Clan  . Cerebral infarction (HCC)   .  CKD (chronic kidney disease), stage III   . Asthma   . Chronic diastolic CHF (congestive heart failure) (HCC)     a. 10/2013 Echo: EF 60-65%, no rwma, Gr1 DD, mild MR, mildly dil LA.  Marland Kitchen Hypertensive heart disease   . Right rib fracture     a. 04/2015.    Social History  Substance Use Topics  . Smoking status: Never Smoker   . Smokeless tobacco: Never Used     Comment: former passive smoker  . Alcohol Use: No    Family History  Problem Relation Age of Onset  . Esophageal cancer Mother   . Diabetes Mother   . Cancer Mother     esophageal    Allergies  Allergen Reactions  . Oxycodone Nausea And Vomiting    OBJECTIVE: Blood pressure 130/57, pulse 59, temperature 98.4 F (36.9 C), temperature source Oral, resp. rate 19, height 5\' 3"  (1.6 m), weight 232 lb (105.235 kg), SpO2 95 %.  Physical Exam  Constitutional: She is oriented to person, place, and time.  She is alert and in no distress. She is sitting up in bed working with physical therapists.  HENT:  She has a right IJ triple-lumen catheter.  Cardiovascular: Normal rate and regular rhythm.   No murmur heard. Pulmonary/Chest: Effort normal and breath sounds normal.  Abdominal: Soft. There is no tenderness.  There is soft brown stool and a flexiseal tube. She has a Foley catheter.  Musculoskeletal:  There is a VAC dressing on her right hip wound.  Neurological: She is alert and oriented to person, place, and time.  Skin: No rash noted.  Psychiatric: Mood and affect normal.    Lab Results Lab Results  Component Value Date   WBC 13.7* 11/24/2015   HGB 8.2* 11/24/2015   HCT 24.8* 11/24/2015   MCV 92.5 11/24/2015   PLT 311 11/24/2015    Lab Results  Component Value Date   CREATININE 0.94 11/23/2015   BUN 7 11/23/2015   NA 139 11/23/2015   K 3.2* 11/23/2015   CL 102 11/23/2015   CO2 32 11/23/2015    Lab Results  Component Value Date   ALT 8* 11/16/2015   AST 15 11/16/2015   ALKPHOS 108 11/16/2015    BILITOT 0.5 11/16/2015     Microbiology: Recent Results (from the past 240 hour(s))  MRSA PCR Screening     Status: None   Collection Time: 11/16/15 10:06 PM  Result Value Ref Range Status   MRSA by PCR NEGATIVE NEGATIVE Final    Comment:        The GeneXpert MRSA Assay (FDA approved for NASAL specimens only), is one component of a comprehensive MRSA colonization surveillance program. It is not intended to diagnose MRSA infection nor to guide or monitor treatment  for MRSA infections.     Cliffton Asters, MD [facility] [phone] pager   [phone] cell 11/24/2015, 12:06 PM

## 2015-11-24 NOTE — Progress Notes (Signed)
ANTICOAGULATION CONSULT NOTE - Follow Up Consult  Pharmacy Consult for Heparin Indication: atrial fibrillation  Allergies  Allergen Reactions  . Oxycodone Nausea And Vomiting    Patient Measurements: Height: 5\' 3"  (160 cm) Weight: 232 lb (105.235 kg) IBW/kg (Calculated) : 52.4  Vital Signs: Temp: 98.4 F (36.9 C) (07/07 0735) Temp Source: Oral (07/07 0735) BP: 130/57 mmHg (07/07 0735) Pulse Rate: 59 (07/07 0735)  Labs:  Recent Labs  11/22/15 0540  11/23/15 0451 11/23/15 1445 11/23/15 2253 11/24/15 0050 11/24/15 0545 11/24/15 1007  HGB 8.3*  --  8.2*  --   --   --   --  8.2*  HCT 26.2*  --  25.9*  --   --   --   --  24.8*  PLT 293  --  334  --   --   --   --  311  APTT 30  --   --   --   --   --   --   --   HEPARINUNFRC  --   < > 0.14* 0.21* 0.22*  --   --   --   CREATININE 1.22*  --  0.94  --   --   --   --   --   TROPONINI  --   --   --   --   --  0.03* 0.03*  --   < > = values in this interval not displayed.  Estimated Creatinine Clearance: 57.2 mL/min (by C-G formula based on Cr of 0.94).   Assessment: 58 Y OF on apixaban PTA for hx Afib - held PTA for plans to go to the OR and transitioned to Heparin post-op. Per MD - to transition back to apixaban 7/7.  The patient was started on Rifampin on 7/6 since the patient has a polymicrobial R-hip infection that includes MSSA with concern for possible prosthesis presence via review of op-notes. Concurrent rifampin and apixaban are contraindicated since rifampin can lower the levels of apixaban. Dr. 9/6 was made aware of the interaction and has d/ced rifampin and consulted ID for antibiotic recommendations and LOT. Plans are to continue with the transition to apixaban this morning.   Heparin is to be turned off and apixaban can be given at the time heparin is discontinued - this transition was discussed with the RN.  Will follow-up with ID recommendations - if Rifampin needs to be on board, then best anticoagulation  options will be Lovenox monotherapy vs Lovenox bridge to warfarin with frequent INR monitoring as this will also interact with rifampin.   Goal of Therapy:  Appropriate anticoagulation for indication and hepatic/renal function   Plan:  1. D/c heparin 2. Start apixaban 5 mg twice daily (first dose on 7/7 AM) 3. Will follow-up ID consult and plans for rifampin 4. Will monitor for any s/sx of bleeding and will plan to re-education prior to discharge  Sharon Seller, PharmD, BCPS Clinical Pharmacist Pager: [phone] 11/24/2015 11:02 AM

## 2015-11-24 NOTE — Progress Notes (Addendum)
While rounding on the patient she complained that she was experiencing chest pain at 10/10. She described the pain as a dull aching in her left upper chest. Vital signs stable, pt asymptomatic except for pain. 12 lead EKG obtained. MD called Wyn Forster NP).  MD advised to give the pt nitro glycerin SL.  Pt given SL nitro x3 over the next 20 minutes. Vital signs remained stable and the pt reported that the pain decreased from 10/10 to 8/10.  Follow up 12 lead EKG was obtained 30 minutes following nitro administration.  MD was notified of changes and ordered 2mg  IV morphine to address continued pain.  Pt calm and resting following event. Vital signs stable. Will continue to monitor.

## 2015-11-24 NOTE — Progress Notes (Signed)
Physical Therapy Treatment Patient Details Name: [name] [name] MRN: [id number] DOB: 07-May-1935 Today's Date: 11/24/2015    History of Present Illness 81 y.o. female with PMH including recent right hip hemiarthroplasty roughly 10/27/15 (due to hip fracture) complicated by infected surgical site. She was admitted again to [facility] on 6/27 and underwent I&D of the right hip with implantation of spacer and wound vac. Transferred to [facility] 6/30 due to worsening renal failure, bradycardia, hypotension. Initiated CRRT 6/30. Last CRRT 7/2 PMHx-- PAF, CAD, chronic diastolic CHF (EF 40-10% by echo in 07/2015), Stage 3 CKD, COPD, HTN, and HLD    PT Comments    Ms. Rothman is making very slow progress, requiring max encouragement to participate in therapy.  She requires +2 mod assist for bed mobility.   Attempted sit>stand x2 w/ use of steady and max +2 assist; however, pt unable to provide much assist and barely clears buttocks from bed.  Pt will benefit from continued skilled PT services to increase functional independence and safety.  Pt will benefit from continued skilled PT services to increase functional independence and safety.   Follow Up Recommendations  SNF;Supervision/Assistance - 24 hour     Equipment Recommendations  Other (comment) (TBA at SNF)    Recommendations for Other Services       Precautions / Restrictions Precautions Precautions: Other (comment);Fall;Posterior Hip Precaution Comments: Per Dr. Eulah Pont pt can participate in P/AA/AROM exercises.  He wants main focus to be strict TDWB w/ RW.  Pt unable to recall any hip precautions, educated pt on these at start of session. Restrictions Weight Bearing Restrictions: Yes RLE Weight Bearing: Touchdown weight bearing    Mobility  Bed Mobility Overal bed mobility: Needs Assistance Bed Mobility: Supine to Sit;Sit to Supine     Supine to sit: Mod assist;+2 for physical assistance;HOB elevated Sit to supine: Mod assist;+2 for physical  assistance   General bed mobility comments: Pt needed assist for elevation of trunk and for LEs.Used pad to get pts hips to EOB.   Transfers Overall transfer level: Needs assistance   Transfers: Sit to/from Stand Sit to Stand: Max assist;+2 physical assistance;+2 safety/equipment;From elevated surface         General transfer comment: Attempted sit>stand x2 w/ use of steady and max +2 assist; however, pt unable to provide much assist and barely clears buttocks from bed.  Will need Bariatric steady if this is attempted again.  Ambulation/Gait                 Stairs            Wheelchair Mobility    Modified Rankin (Stroke Patients Only)       Balance Overall balance assessment: Needs assistance Sitting-balance support: Feet supported;No upper extremity supported Sitting balance-Leahy Scale: Fair Sitting balance - Comments: Pt able to sit EOB ~10 minutes to perform exercises.  Min guard assist for safety.   Standing balance support: Bilateral upper extremity supported;During functional activity Standing balance-Leahy Scale: Zero                      Cognition Arousal/Alertness: Awake/alert Behavior During Therapy: WFL for tasks assessed/performed Overall Cognitive Status: No family/caregiver present to determine baseline cognitive functioning Area of Impairment: Orientation;Attention;Following commands;Awareness;Memory Orientation Level: Time;Situation Current Attention Level: Sustained Memory: Decreased short-term memory Following Commands: Follows one step commands consistently            Exercises General Exercises - Lower Extremity Ankle Circles/Pumps: AROM;Both;20 reps;Seated;Supine [employer]  Quad: AROM;Both;10 reps;Seated Hip ABduction/ADduction: AAROM;Right;10 reps;Supine    General Comments General comments (skin integrity, edema, etc.): Pt requires max encouragement to participate with therapy but was agreeable.        Pertinent  Vitals/Pain Pain Assessment: Faces Faces Pain Scale: Hurts even more Pain Location: Rt hip Pain Descriptors / Indicators: Grimacing;Guarding;Moaning Pain Intervention(s): Limited activity within patient's tolerance;Monitored during session;Repositioned    Home Living                      Prior Function            PT Goals (current goals can now be found in the care plan section) Acute Rehab PT Goals PT Goal Formulation: With patient Time For Goal Achievement: 12/05/15 Potential to Achieve Goals: Fair Progress towards PT goals: Progressing toward goals (modestly)    Frequency  Min 3X/week    PT Plan Frequency needs to be updated    Co-evaluation             End of Session Equipment Utilized During Treatment: Gait belt;Oxygen Activity Tolerance: Patient limited by fatigue;Patient limited by pain Patient left: in bed;with call bell/phone within reach;with SCD's reapplied     Time: 8416-6063 PT Time Calculation (min) (ACUTE ONLY): 32 min  Charges:  $Therapeutic Exercise: 8-22 mins $Therapeutic Activity: 23-37 mins                    G Codes:       Encarnacion Chu PT, DPT  Pager: [phone] Phone: [phone] 11/24/2015, 12:51 PM

## 2015-11-24 NOTE — Progress Notes (Addendum)
[name] [name] TEAM 1 - Stepdown/ICU TEAM  [name] [name]  PYK:[id number] DOB: Nov 29, 1934 DOA: 11/16/2015 PCP: [name] Dove, MD   Brief Narrative:  81 y.o. F Hx CVA, DM2, PAF, CAD, Chronic Diastolic CHF, HTN, Asthma, HLD, hypothyroidism, RA, CKD stage III, and a R Hip Hemiarthroplasty 10/27/15 complicated by infected surgical site. She was admitted to [name] on 6/27 and underwent I&D of the right hip with implantation of spacer and wound vac and was started on empiric vanc / zosyn. In addition, she had severe bradycardia with HR in the 20's. She was seen in consultation by Cardiology who felt the bradycardia was related to amiodarone and metoprolol; therefore, these were discontinued and pt was started on Isuprel. Her hospital stay was complicated by anemia for which she received 4u PRBC transfusion along with worsening renal failure.  She was transferred to [name] 6/29 for further evaluation and management.  Since arrival at [name], the pt has experienced resolving bradycardia from junctional rhythm > normal rate and rhythm. Weaned off dopamine 7/1 . Orthopedics has been followinggiven septic joint with wound VAC and spacer in place. Nephrology has been following for ongoing renal failure. UOP good, scr now at baseline. CRRT stopped 7/2. Had anemia for which heparin drip has been dc'd.   Assessment & Plan:   Sepsis due to MSSA, Enterobacter, Klebsiella infected right hip positive -S/P R Hip Hemiarthroplasty 6/9 -S/P I&D 6/27 w/ spacer & wound vac -Continue current antibiotics - consult ID today for recs on best agent and length of tx  -Orthopedics evaluated at time of admit but not actively following  -Wound Care being managed by WOC under direction of Ortho  -PT/OT as per Ortho recs  Acute Renal Failure - resolved -baseline Cr 1.0-1.5 -Renal US negative hydronephrosis  -CRRT started 7/1 and ended on 7/2  -HD cath removed from L neck   Recent Labs Lab 11/18/15 1612  11/19/15 0445 11/20/15 0405 11/21/15 0503 11/22/15 0540 11/23/15 0451  CREATININE 2.05* 1.23* 1.11* 1.17* 1.22* 0.94    Possible LLL HCAP / Acute Hypoxic Respiratory Failure -now on RA w/ sats 95% -not convinced this represents an infection - is nonetheless covered w/ abx indicated for joint infection   Bradycardia  -Reportedly 3rd degree AVB at [name] -Resolved  PAF  -Chadsvasc 9 -on Eliquis + Amio as outpatient  -Currently in NSR -Cardiology suggests resuming amiodarone at 200 mg daily at time of discharge -resumed heparin 7/5 - w/ no evidence of blood loss, will transition back to Eliquis today   Chronic diastolic congestive heart failure -TTE 7/2 EF 60-65% - mild aortic stenosis, PA peak pressure: 57 mm Hg (S) - well compensated at present  Filed Weights   11/22/15 1227 11/23/15 0300 11/24/15 0400  Weight: 107.956 kg (238 lb) 104.781 kg (231 lb) 105.235 kg (232 lb)    HTN -BP improved   Pulmonary hypertension  -PA peak pressure: 57 mm Hg (S)  CAD   -asymptomatic   HLD  Acute blood loss anemia  -Transfused 4 units PRBC at [name]?  -Transfused 1 unit PRBC thus far at [name] -stable at this time    Recent Labs Lab 11/20/15 0405 11/20/15 1800 11/21/15 0503 11/22/15 0540 11/23/15 0451  HGB 7.4* 8.0* 7.9* 8.3* 8.2*    DM2 -5/23 A1c 10.5 - CBG not yet at goal - adjust tx again today   Hypomagnesemia -replace and follow   Hypothyroidism -TSH 1.861  Anxiety/Depression  Neurogenic Bladder -Chronic foley - placed at [name]  DVT prophylaxis: SCDs Code Status: Full Family Communication: No family present at time of exam Disposition Plan: Stable for transfer to orthopedic floor - PT/OT - remove central venous line - ID to suggest antibiotic agent and course of treatment  Consultants:  PCCM Nephrology  Cardiology  ID  Procedures/Significant Events:  06/09 - right hip hemiarthroplasty 06/27 - admitted to [facility] with  infected right hip - taken to OR for I&D and spacer implantation 06/29 - Worsened renal function - Transfused 4 units PRBC?? - transferred to [name] EKG 6/29: Junctional bradycardia. Port CXR 6/30: Right hilar fullness with silhouetting of the left hemidiaphragm suggestive of retrocardiac opacity. Globular heart with suggestion of cardiomegaly. 7/1 CRRT started > 7/2 TTE on 7/2 EF 60-65%. There may be mild aortic stenosis. The mitral annulus is calcified. Pulmonary arteries: PA peak pressure: 57 mm Hg (S). 7/2 transfuse 1 units PRBC  Antimicrobials: Zozyn 06/27 > 7/1 Merrem 7/1 > Rifampin 7/6   Subjective: The patient is alert and conversant.  She denies chest pain shortness breath fevers chills nausea or vomiting.  Patient reportedly experienced some chest pain early this morning but EKGs were without acute change and patient reports symptoms have resolved at this time.   Objective: Filed Vitals:   11/23/15 1945 11/23/15 2315 11/24/15 0400 11/24/15 0735  BP: 138/58 138/54 128/64 130/57  Pulse: 63 62 64 59  Temp: 98.1 F (36.7 C) 98.4 F (36.9 C) 98.3 F (36.8 C) 98.4 F (36.9 C)  TempSrc: Oral Oral Oral Oral  Resp: 24 26 23 19   Height:      Weight:   105.235 kg (232 lb)   SpO2: 95% 95% 96% 95%    Intake/Output Summary (Last 24 hours) at 11/24/15 0837 Last data filed at 11/24/15 0400  Gross per 24 hour  Intake 2638.05 ml  Output   2275 ml  Net 363.05 ml   Filed Weights   11/22/15 1227 11/23/15 0300 11/24/15 0400  Weight: 107.956 kg (238 lb) 104.781 kg (231 lb) 105.235 kg (232 lb)    Examination: General: No acute respiratory distress Lungs: Clear to auscultation bilaterally without wheeze Cardiovascular: Regular rate and rhythm without murmur gallop or rub  Abdomen: Nontender, nondistended, soft, bowel sounds positive, no rebound, no ascites, no appreciable mass Extremities: No significant cyanosis or clubbing - trace B LE edema   CBC:  Recent Labs Lab  11/18/15 0501  11/20/15 0405 11/20/15 1800 11/21/15 0503 11/22/15 0540 11/23/15 0451  WBC 17.4*  < > 10.4 12.5* 12.7* 14.9* 12.7*  NEUTROABS 14.1*  --   --   --   --  12.6* 10.2*  HGB 7.3*  < > 7.4* 8.0* 7.9* 8.3* 8.2*  HCT 22.4*  < > 23.1* 25.0* 25.0* 26.2* 25.9*  MCV 87.2  < > 90.9 91.2 91.9 94.6 92.2  PLT 413*  < > 293 316 321 293 334  < > = values in this interval not displayed. Basic Metabolic Panel:  Recent Labs Lab 11/18/15 0501  11/19/15 0445 11/20/15 0405 11/21/15 0503 11/22/15 0540 11/23/15 0451 11/23/15 1445  NA  --   < > 135 142 139 139 139  --   K  --   < > 3.8 3.8 4.0 3.8 2.9* 3.2*  CL  --   < > 102 99* 107 103 102  --   CO2  --   < > 26 25 23 24  32  --   GLUCOSE  --   < > 131* 129* 135*  185* 166*  --   BUN  --   < > 12 7 8 10 7   --   CREATININE  --   < > 1.23* 1.11* 1.17* 1.22* 0.94  --   CALCIUM  --   < > 7.0* 8.5* 7.9* 8.1* 7.8*  --   MG 1.3*  --  2.0  --   --  1.5*  --  1.3*  PHOS  --   < > 2.4*  --  2.4* 2.3* 1.9* 2.7  < > = values in this interval not displayed. GFR: Estimated Creatinine Clearance: 57.2 mL/min (by C-G formula based on Cr of 0.94).   Liver Function Tests:  Recent Labs Lab 11/18/15 0502 11/18/15 1612 11/21/15 0503 11/22/15 0540 11/23/15 0451  ALBUMIN 1.6* 1.5* 1.7* 1.9* 1.8*   Coagulation Profile: No results for input(s): INR, PROTIME in the last 168 hours.   Cardiac Enzymes:  Recent Labs Lab 11/24/15 0050 11/24/15 0545  TROPONINI 0.03* 0.03*    CBG:  Recent Labs Lab 11/22/15 2103 11/23/15 0803 11/23/15 1208 11/23/15 1615 11/23/15 2244  GLUCAP 277* 154* 316* 223* 291*    Recent Results (from the past 240 hour(s))  MRSA PCR Screening     Status: None   Collection Time: 11/16/15 10:06 PM  Result Value Ref Range Status   MRSA by PCR NEGATIVE NEGATIVE Final    Comment:        The GeneXpert MRSA Assay (FDA approved for NASAL specimens only), is one component of a comprehensive MRSA  colonization surveillance program. It is not intended to diagnose MRSA infection nor to guide or monitor treatment for MRSA infections.      Scheduled Meds: . amLODipine  10 mg Oral Daily  . antiseptic oral rinse  7 mL Mouth Rinse q12n4p  . atorvastatin  20 mg Oral q1800  . chlorhexidine  15 mL Mouth Rinse BID  . darbepoetin (ARANESP) injection - NON-DIALYSIS  100 mcg Subcutaneous Q Wed-1800  . feeding supplement  1 Container Oral TID WC  . feeding supplement (PRO-STAT SUGAR FREE 64)  30 mL Oral TID  . furosemide  80 mg Oral Daily  . insulin aspart  0-9 Units Subcutaneous TID WC  . insulin glargine  10 Units Subcutaneous QHS  . levothyroxine  150 mcg Oral QAC breakfast  . meropenem (MERREM) IV  1 g Intravenous Q12H  . pantoprazole  40 mg Oral Q1200  . rifampin (RIFADIN) IVPB  600 mg Intravenous Q24H  . sertraline  50 mg Oral Daily     LOS: 8 days    11/18/15, MD [facility] Office  [phone] Pager - Text Page per Amion as per below:  On-Call/Text Page:      [phone]      password [email or web address]  If 7PM-7AM, please contact night-coverage [email or web address] Password [name] 11/24/2015, 8:37 AM

## 2015-11-25 DIAGNOSIS — E118 Type 2 diabetes mellitus with unspecified complications: Secondary | ICD-10-CM

## 2015-11-25 DIAGNOSIS — A4901 Methicillin susceptible Staphylococcus aureus infection, unspecified site: Secondary | ICD-10-CM

## 2015-11-25 DIAGNOSIS — E1165 Type 2 diabetes mellitus with hyperglycemia: Secondary | ICD-10-CM

## 2015-11-25 DIAGNOSIS — D62 Acute posthemorrhagic anemia: Secondary | ICD-10-CM

## 2015-11-25 DIAGNOSIS — L89152 Pressure ulcer of sacral region, stage 2: Secondary | ICD-10-CM

## 2015-11-25 DIAGNOSIS — T814XXD Infection following a procedure, subsequent encounter: Secondary | ICD-10-CM

## 2015-11-25 DIAGNOSIS — L8990 Pressure ulcer of unspecified site, unspecified stage: Secondary | ICD-10-CM

## 2015-11-25 DIAGNOSIS — B999 Unspecified infectious disease: Secondary | ICD-10-CM

## 2015-11-25 DIAGNOSIS — A498 Other bacterial infections of unspecified site: Secondary | ICD-10-CM

## 2015-11-25 DIAGNOSIS — L89159 Pressure ulcer of sacral region, unspecified stage: Secondary | ICD-10-CM | POA: Diagnosis present

## 2015-11-25 DIAGNOSIS — N319 Neuromuscular dysfunction of bladder, unspecified: Secondary | ICD-10-CM

## 2015-11-25 DIAGNOSIS — I10 Essential (primary) hypertension: Secondary | ICD-10-CM

## 2015-11-25 LAB — CBC
HEMATOCRIT: 23.2 % — AB (ref 36.0–46.0)
Hemoglobin: 7.6 g/dL — ABNORMAL LOW (ref 12.0–15.0)
MCH: 30.4 pg (ref 26.0–34.0)
MCHC: 32.8 g/dL (ref 30.0–36.0)
MCV: 92.8 fL (ref 78.0–100.0)
PLATELETS: 305 10*3/uL (ref 150–400)
RBC: 2.5 MIL/uL — ABNORMAL LOW (ref 3.87–5.11)
RDW: 17.4 % — AB (ref 11.5–15.5)
WBC: 9.8 10*3/uL (ref 4.0–10.5)

## 2015-11-25 LAB — GLUCOSE, CAPILLARY
GLUCOSE-CAPILLARY: 172 mg/dL — AB (ref 65–99)
GLUCOSE-CAPILLARY: 185 mg/dL — AB (ref 65–99)
Glucose-Capillary: 148 mg/dL — ABNORMAL HIGH (ref 65–99)
Glucose-Capillary: 180 mg/dL — ABNORMAL HIGH (ref 65–99)

## 2015-11-25 LAB — BASIC METABOLIC PANEL
ANION GAP: 7 (ref 5–15)
BUN: 8 mg/dL (ref 6–20)
CALCIUM: 7.6 mg/dL — AB (ref 8.9–10.3)
CO2: 32 mmol/L (ref 22–32)
Chloride: 99 mmol/L — ABNORMAL LOW (ref 101–111)
Creatinine, Ser: 0.8 mg/dL (ref 0.44–1.00)
GFR calc Af Amer: >60 mL/min (ref >60)
GFR calc non Af Amer: >60 mL/min (ref >60)
GLUCOSE: 175 mg/dL — AB (ref 65–99)
Potassium: 2.7 mmol/L — CL (ref 3.5–5.1)
Sodium: 138 mmol/L (ref 135–145)

## 2015-11-25 LAB — TROPONIN I: Troponin I: 0.05 ng/mL (ref <0.03)

## 2015-11-25 LAB — PREPARE RBC (CROSSMATCH)

## 2015-11-25 LAB — MAGNESIUM: Magnesium: 1.6 mg/dL — ABNORMAL LOW (ref 1.7–2.4)

## 2015-11-25 MED ORDER — SODIUM CHLORIDE 0.9% FLUSH
10.0000 mL | INTRAVENOUS | Status: DC | PRN
  Administered 2015-11-26: 20 mL
  Administered 2015-11-26 – 2015-11-27 (×2): 10 mL
  Filled 2015-11-25 (×3): qty 40

## 2015-11-25 MED ORDER — MAGNESIUM SULFATE 2 GM/50ML IV SOLN
2.0000 g | Freq: Once | INTRAVENOUS | Status: AC
  Administered 2015-11-25: 2 g via INTRAVENOUS
  Filled 2015-11-25: qty 50

## 2015-11-25 MED ORDER — SODIUM CHLORIDE 0.9 % IV SOLN
Freq: Once | INTRAVENOUS | Status: AC
  Administered 2015-11-25 – 2015-11-26 (×2): via INTRAVENOUS

## 2015-11-25 MED ORDER — POTASSIUM CHLORIDE CRYS ER 20 MEQ PO TBCR
40.0000 meq | EXTENDED_RELEASE_TABLET | Freq: Once | ORAL | Status: AC
  Administered 2015-11-25: 40 meq via ORAL
  Filled 2015-11-25: qty 2

## 2015-11-25 MED ORDER — POTASSIUM CHLORIDE CRYS ER 20 MEQ PO TBCR: 40.0000 meq | EXTENDED_RELEASE_TABLET | Freq: Two times a day (BID) | ORAL | Status: DC

## 2015-11-25 MED ORDER — POTASSIUM CHLORIDE CRYS ER 20 MEQ PO TBCR
40.0000 meq | EXTENDED_RELEASE_TABLET | Freq: Two times a day (BID) | ORAL | Status: DC
  Administered 2015-11-25 – 2015-11-27 (×4): 40 meq via ORAL
  Filled 2015-11-25 (×5): qty 2

## 2015-11-25 MED ORDER — POTASSIUM CHLORIDE 10 MEQ/100ML IV SOLN
10.0000 meq | INTRAVENOUS | Status: AC
  Administered 2015-11-25 (×4): 10 meq via INTRAVENOUS
  Filled 2015-11-25 (×4): qty 100

## 2015-11-25 NOTE — Progress Notes (Signed)
Peripherally Inserted Central Catheter/Midline Placement  The IV Nurse has discussed with the patient and/or persons authorized to consent for the patient, the purpose of this procedure and the potential benefits and risks involved with this procedure.  The benefits include less needle sticks, lab draws from the catheter, ability to perform PICC exchange if ordered by the physician and patient may be discharged home with the catheter.  Risks include, but not limited to, infection, bleeding, blood clot (thrombus formation), and puncture of an artery; nerve damage and irregular heart beat.  Alternatives to this procedure were also discussed.  Bard educational information left with patient, states will read later.  PICC/Midline Placement Documentation  PICC Double Lumen 11/25/15 PICC Left Basilic 42 cm 1 cm (Active)  Indication for Insertion or Continuance of Line Prolonged intravenous therapies 11/25/2015 11:17 AM  Exposed Catheter (cm) 1 cm 11/25/2015 11:17 AM  Site Assessment Clean;Dry;Intact;Ecchymotic 11/25/2015 11:17 AM  Lumen #1 Status Flushed;Saline locked;Blood return noted 11/25/2015 11:17 AM  Lumen #2 Status Flushed;Saline locked;Blood return noted 11/25/2015 11:17 AM  Dressing Type Transparent 11/25/2015 11:17 AM  Dressing Status Clean;Dry;Intact;Antimicrobial disc in place 11/25/2015 11:17 AM  Line Care Connections checked and tightened 11/25/2015 11:17 AM  Line Adjustment (NICU/IV Team Only) No 11/25/2015 11:17 AM  Dressing Intervention New dressing 11/25/2015 11:17 AM  Dressing Change Due 12/02/15 11/25/2015 11:17 AM       [name] [name] 11/25/2015, 11:17 AM

## 2015-11-25 NOTE — Progress Notes (Signed)
[place] TEAM 1 - Stepdown/ICU TEAM  LEDIA HANFORD  JQG:[id number] DOB: [birth year] DOA: 11/16/2015 PCP: [name], MD   Brief Narrative:  81 y.o. F Hx CVA, DM2, PAF, CAD, Chronic Diastolic CHF, HTN, Asthma, HLD, hypothyroidism, RA, CKD stage III, and a R Hip Hemiarthroplasty 10/27/15 complicated by infected surgical site. She was admitted to [facility] on 6/27 and underwent I&D of the right hip with implantation of spacer and wound vac and was started on empiric vanc / zosyn. In addition, she had severe bradycardia with HR in the 20's. She was seen in consultation by Cardiology who felt the bradycardia was related to amiodarone and metoprolol; therefore, these were discontinued and pt was started on Isuprel. Her hospital stay was complicated by anemia for which she received 4u PRBC transfusion along with worsening renal failure.  She was transferred to [facility] 6/29 for further evaluation and management.  Since arrival at [facility], the pt has experienced resolving bradycardia from junctional rhythm > normal rate and rhythm. Weaned off dopamine 7/1 . Orthopedics has been followinggiven septic joint with wound VAC and spacer in place. Nephrology has been following for ongoing renal failure. UOP good, scr now at baseline. CRRT stopped 7/2. Had anemia for which heparin drip has been dc'd.   Assessment & Plan:   Sepsis due to MSSA, Enterobacter, Klebsiella infected right hip positive -S/P R Hip Hemiarthroplasty 6/9 -S/P I&D 6/27 w/ spacer & wound vac -Orthopedics evaluated at time of admit but not actively following  -Wound Care being managed by WOC under direction of Ortho  -PT/OT as per Ortho recs -Request for PICC line placement for long term antibiotic therapy thru 12/26/15 -Per ID would need high dose ceftriaxone thru 12/26/15  Stage 2 sacral pressure ulcer - continue current skin care protocol  Acute Renal Failure - resolved -baseline Cr 1.0-1.5 -Renal US negative hydronephrosis    -CRRT started 7/1 and ended on 7/2  -HD cath removed from L neck   Recent Labs Lab 11/19/15 0445 11/20/15 0405 11/21/15 0503 11/22/15 0540 11/23/15 0451 11/25/15 0520  CREATININE 1.23* 1.11* 1.17* 1.22* 0.94 0.80   Possible LLL HCAP / Acute Hypoxic Respiratory Failure -now on RA w/ sats 95% -not convinced this represents an infection - is nonetheless covered w/ abx indicated for joint infection   Bradycardia  -Reportedly 3rd degree AVB at [facility] -Resolved  PAF  -Chadsvasc 9 -on Eliquis + Amio as outpatient  -Currently in NSR -Cardiology suggests resuming amiodarone at 200 mg daily at time of discharge -resumed heparin 7/5 - w/ no evidence of blood loss, will transition back to Eliquis today   Chronic diastolic congestive heart failure -TTE 7/2 EF 60-65% - mild aortic stenosis, PA peak pressure: 57 mm Hg (S) - well compensated at present  [employer]   11/22/15 1227 11/23/15 0300 11/24/15 0400  Weight: 238 lb (107.956 kg) 231 lb (104.781 kg) 232 lb (105.235 kg)   HTN -BP improved   Pulmonary hypertension  -PA peak pressure: 57 mm Hg (S)  CAD   -asymptomatic   HLD  Acute blood loss anemia  -Transfused 4 units PRBC at [facility]?  -Transfused 1 unit PRBC thus far at [facility] -Transfuse additional 2 unit PRBC 7/8  Recent Labs Lab 11/21/15 0503 11/22/15 0540 11/23/15 0451 11/24/15 1007 11/25/15 0520  HGB 7.9* 8.3* 8.2* 8.2* 7.6*    DM2 -5/23 A1c 10.5 - CBG not yet at goal - adjust tx again today  CBG (last 3)   Recent Labs  11/24/15 1840 11/24/15 2110 11/25/15 0614  GLUCAP 183* 274* 172*   Hypomagnesemia -replace and follow   Hypothyroidism -TSH 1.861  Hypokalemia - replete orally and in IV  Anxiety/Depression Stable  Neurogenic Bladder -Chronic foley - placed at [facility]  DVT prophylaxis: SCDs Code Status: Full Family Communication:  Disposition Plan: SNF placement  Consultants:  PCCM Nephrology  Cardiology   ID  Procedures/Significant Events:  06/09 - right hip hemiarthroplasty 06/27 - admitted to [facility] with infected right hip - taken to OR for I&D and spacer implantation 06/29 - Worsened renal function - Transfused 4 units PRBC?? - transferred to [facility] EKG 6/29: Junctional bradycardia. Port CXR 6/30: Right hilar fullness with silhouetting of the left hemidiaphragm suggestive of retrocardiac opacity. Globular heart with suggestion of cardiomegaly. 7/1 CRRT started > 7/2 TTE on 7/2 EF 60-65%. There may be mild aortic stenosis. The mitral annulus is calcified. Pulmonary arteries: PA peak pressure: 57 mm Hg (S). 7/2 transfuse 1 units PRBC 7/7 restarted eliquis, ID consultation 7/8 transfuse 2 units ordered PICC line, replaced Magnesium  Antimicrobials: Zozyn 06/27 > 7/1 Merrem 7/1 > Rifampin 7/6  Ceftriaxone 7/7  Subjective: No acute events overnight.   Objective: Filed Vitals:   11/24/15 1556 11/24/15 2112 11/25/15 0037 11/25/15 0500  BP: 133/51 132/45 141/48 138/50  Pulse: 66 64 64 62  Temp: 97 F (36.1 C) 99 F (37.2 C) 99 F (37.2 C) 98.8 F (37.1 C)  TempSrc: Oral  Oral   Resp:  18 17 16   Height:      Weight:      SpO2: 97% 98% 95% 95%    Intake/Output Summary (Last 24 hours) at 11/25/15 0850 Last data filed at 11/25/15 0655  Gross per 24 hour  Intake      0 ml  Output   1650 ml  Net  -1650 ml   Filed Weights   11/22/15 1227 11/23/15 0300 11/24/15 0400  Weight: 238 lb (107.956 kg) 231 lb (104.781 kg) 232 lb (105.235 kg)    Examination: General: No acute respiratory distress Lungs: Clear to auscultation bilaterally without wheeze Cardiovascular: Regular rate and rhythm without murmur gallop or rub  Abdomen: Nontender, nondistended, soft, bowel sounds positive, no rebound, no ascites, no appreciable mass Extremities: No significant cyanosis or clubbing - trace B LE edema  CBC:  Recent Labs Lab 11/21/15 0503 11/22/15 0540 11/23/15 0451  11/24/15 1007 11/25/15 0520  WBC 12.7* 14.9* 12.7* 13.7* 9.8  NEUTROABS  --  12.6* 10.2*  --   --   HGB 7.9* 8.3* 8.2* 8.2* 7.6*  HCT 25.0* 26.2* 25.9* 24.8* 23.2*  MCV 91.9 94.6 92.2 92.5 92.8  PLT 321 293 334 311 305   Basic Metabolic Panel:  Recent Labs Lab 11/19/15 0445 11/20/15 0405 11/21/15 0503 11/22/15 0540 11/23/15 0451 11/23/15 1445 11/24/15 1007 11/25/15 0520  NA 135 142 139 139 139  --   --  138  K 3.8 3.8 4.0 3.8 2.9* 3.2*  --  2.7*  CL 102 99* 107 103 102  --   --  99*  CO2 26 25 23 24  32  --   --  32  GLUCOSE 131* 129* 135* 185* 166*  --   --  175*  BUN 12 7 8 10 7   --   --  8  CREATININE 1.23* 1.11* 1.17* 1.22* 0.94  --   --  0.80  CALCIUM 7.0* 8.5* 7.9* 8.1* 7.8*  --   --  7.6*  MG 2.0  --   --  1.5*  --  1.3* 1.6* 1.6*  PHOS 2.4*  --  2.4* 2.3* 1.9* 2.7 2.3*  --    GFR: Estimated Creatinine Clearance: 67.2 mL/min (by C-G formula based on Cr of 0.8).   Liver Function Tests:  Recent Labs Lab 11/18/15 1612 11/21/15 0503 11/22/15 0540 11/23/15 0451  ALBUMIN 1.5* 1.7* 1.9* 1.8*   Coagulation Profile: No results for input(s): INR, PROTIME in the last 168 hours.   Cardiac Enzymes:  Recent Labs Lab 11/24/15 0050 11/24/15 0545 11/24/15 1007 11/25/15 0520  TROPONINI 0.03* 0.03* 0.05* 0.05*   CBG:  Recent Labs Lab 11/24/15 0734 11/24/15 1234 11/24/15 1840 11/24/15 2110 11/25/15 0614  GLUCAP 178* 270* 183* 274* 172*    Recent Results (from the past 240 hour(s))  MRSA PCR Screening     Status: None   Collection Time: 11/16/15 10:06 PM  Result Value Ref Range Status   MRSA by PCR NEGATIVE NEGATIVE Final    Comment:        The GeneXpert MRSA Assay (FDA approved for NASAL specimens only), is one component of a comprehensive MRSA colonization surveillance program. It is not intended to diagnose MRSA infection nor to guide or monitor treatment for MRSA infections.      Scheduled Meds: . sodium chloride   Intravenous Once  .  amLODipine  10 mg Oral Daily  . antiseptic oral rinse  7 mL Mouth Rinse q12n4p  . apixaban  5 mg Oral BID  . atorvastatin  20 mg Oral q1800  . cefTRIAXone (ROCEPHIN)  IV  2 g Intravenous Q24H  . chlorhexidine  15 mL Mouth Rinse BID  . darbepoetin (ARANESP) injection - NON-DIALYSIS  100 mcg Subcutaneous Q Wed-1800  . feeding supplement  1 Container Oral TID WC  . feeding supplement (PRO-STAT SUGAR FREE 64)  30 mL Oral TID  . furosemide  80 mg Oral Daily  . insulin aspart  0-9 Units Subcutaneous TID WC  . insulin glargine  18 Units Subcutaneous QHS  . levothyroxine  150 mcg Oral QAC breakfast  . magnesium sulfate 1 - 4 g bolus IVPB  2 g Intravenous Once  . pantoprazole  40 mg Oral Q1200  . potassium chloride  10 mEq Intravenous Q1 Hr x 4  . potassium chloride  40 mEq Oral BID  . sertraline  50 mg Oral Daily     LOS: 9 days    Maryln Manuel, MD [facility] Office  [phone] Pager - Text Page per Amion as per below:  On-Call/Text Page:      [email or web address]      password [email or web address]  If 7PM-7AM, please contact night-coverage [email or web address] Password [email or web address] 11/25/2015, 8:50 AM

## 2015-11-25 NOTE — Progress Notes (Signed)
CRITICAL VALUE ALERT  Critical value received:  Potassium 2.7  Date of notification:  11/25/15  Time of notification:  0627  Critical value read back:Yes.    Nurse who received alert:  Ashleigh Luckow  MD notified (1st page):  [facility]  Time of first page:  0630  MD notified (2nd page):  Time of second page:  Responding MD:  [facility]  Time MD responded:  Placed orders at [phone]

## 2015-11-25 NOTE — Progress Notes (Signed)
Pharmacy - apixaban dosing  PTA apixaban 5mg  BID for Afib  AKI resolved and renal function stable, CrCl ~53, Scr normalized at 0.8. Pt age 81 yo. Weight 105.2 kg. Hgb low stable. Platelets wnl. No acute bleeding noted.  Continue Apixiban 5 mg BID   Pharmacy signing off, will monitor peripherally.   70, PharmD

## 2015-11-26 DIAGNOSIS — S70259D Superficial foreign body, unspecified hip, subsequent encounter: Secondary | ICD-10-CM

## 2015-11-26 DIAGNOSIS — L089 Local infection of the skin and subcutaneous tissue, unspecified: Secondary | ICD-10-CM

## 2015-11-26 LAB — CBC
HCT: 30.7 % — ABNORMAL LOW (ref 36.0–46.0)
HEMOGLOBIN: 9.7 g/dL — AB (ref 12.0–15.0)
MCH: 28.8 pg (ref 26.0–34.0)
MCHC: 31.6 g/dL (ref 30.0–36.0)
MCV: 91.1 fL (ref 78.0–100.0)
Platelets: 368 10*3/uL (ref 150–400)
RBC: 3.37 MIL/uL — AB (ref 3.87–5.11)
RDW: 18 % — ABNORMAL HIGH (ref 11.5–15.5)
WBC: 9.2 10*3/uL (ref 4.0–10.5)

## 2015-11-26 LAB — COMPREHENSIVE METABOLIC PANEL
ALBUMIN: 1.8 g/dL — AB (ref 3.5–5.0)
ALK PHOS: 142 U/L — AB (ref 38–126)
ALT: 13 U/L — AB (ref 14–54)
AST: 24 U/L (ref 15–41)
Anion gap: 6 (ref 5–15)
BUN: 7 mg/dL (ref 6–20)
CO2: 30 mmol/L (ref 22–32)
CREATININE: 0.83 mg/dL (ref 0.44–1.00)
Calcium: 8.1 mg/dL — ABNORMAL LOW (ref 8.9–10.3)
Chloride: 101 mmol/L (ref 101–111)
GFR calc Af Amer: >60 mL/min (ref >60)
GFR calc non Af Amer: >60 mL/min (ref >60)
GLUCOSE: 142 mg/dL — AB (ref 65–99)
Potassium: 4.2 mmol/L (ref 3.5–5.1)
SODIUM: 137 mmol/L (ref 135–145)
TOTAL PROTEIN: 5.5 g/dL — AB (ref 6.5–8.1)
Total Bilirubin: 0.8 mg/dL (ref 0.3–1.2)

## 2015-11-26 LAB — GLUCOSE, CAPILLARY
GLUCOSE-CAPILLARY: 141 mg/dL — AB (ref 65–99)
GLUCOSE-CAPILLARY: 194 mg/dL — AB (ref 65–99)
Glucose-Capillary: 118 mg/dL — ABNORMAL HIGH (ref 65–99)
Glucose-Capillary: 223 mg/dL — ABNORMAL HIGH (ref 65–99)

## 2015-11-26 LAB — MAGNESIUM: MAGNESIUM: 1.8 mg/dL (ref 1.7–2.4)

## 2015-11-26 NOTE — Progress Notes (Addendum)
[name] [name]  DVV:[id number] DOB: February 26, 1935 DOA: 11/16/2015 PCP: [name], MD   Brief Narrative:  81 y.o. F Hx CVA, DM2, PAF, CAD, Chronic Diastolic CHF, HTN, Asthma, HLD, hypothyroidism, RA, CKD stage III, and a R Hip Hemiarthroplasty 10/27/15 complicated by infected surgical site. She was admitted to [facility] on 6/27 and underwent I&D of the right hip with implantation of spacer and wound vac and was started on empiric vanc / zosyn. In addition, she had severe bradycardia with HR in the 20's. She was seen in consultation by Cardiology who felt the bradycardia was related to amiodarone and metoprolol; therefore, these were discontinued and pt was started on Isuprel. Her hospital stay was complicated by anemia for which she received 4u PRBC transfusion along with worsening renal failure.  She was transferred to [facility] 6/29 for further evaluation and management.  Since arrival at [facility], the pt has experienced resolving bradycardia from junctional rhythm > normal rate and rhythm. Weaned off dopamine 7/1 . Orthopedics has been followinggiven septic joint with wound VAC and spacer in place. Nephrology has been following for ongoing renal failure. UOP good, scr now at baseline. CRRT stopped 7/2. Had anemia for which heparin drip has been dc'd.  7/8 PICC line placed, working on SNF placement  Assessment & Plan:   Sepsis due to MSSA, Enterobacter, Klebsiella infected right hip positive -S/P R Hip Hemiarthroplasty 6/9 -S/P I&D 6/27 w/ spacer & wound vac -Orthopedics evaluated at time of admit but not actively following  -Wound Care being managed by WOC under direction of Ortho  -PT/OT as per Ortho recs -PICC line placement 7/8 for long term antibiotic therapy thru 12/26/15 -Per ID would need high dose ceftriaxone thru 12/26/15 -Plan to SNF when bed available.   Stage 2 sacral pressure ulcer - continue current skin care protocol  Acute Renal Failure - resolved -baseline Cr  1.0-1.5 -Renal US negative hydronephrosis  -CRRT started 7/1 and ended on 7/2  -HD cath removed from L neck   Recent Labs Lab 11/20/15 0405 11/21/15 0503 11/22/15 0540 11/23/15 0451 11/25/15 0520  CREATININE 1.11* 1.17* 1.22* 0.94 0.80   Possible LLL HCAP / Acute Hypoxic Respiratory Failure -now on RA w/ sats 95% -not convinced this represents an infection - is nonetheless covered w/ abx indicated for joint infection   Bradycardia  -Reportedly 3rd degree AVB at [facility] -Resolved  PAF  -Chadsvasc 9 -on Eliquis + Amio as outpatient  -Currently in NSR -Cardiology suggests resuming amiodarone at 200 mg daily at time of discharge -resumed heparin 7/5 - w/ no evidence of blood loss, will transition back to Eliquis   Chronic diastolic congestive heart failure -TTE 7/2 EF 60-65% - mild aortic stenosis, PA peak pressure: 57 mm Hg (S) - well compensated at present  [employer]   11/24/15 0400 11/25/15 2227 11/26/15 0500  Weight: 232 lb (105.235 kg) 231 lb 4.2 oz (104.9 kg) 244 lb 7.8 oz (110.9 kg)   HTN -BP improved   Pulmonary hypertension  -PA peak pressure: 57 mm Hg (S)  CAD   -asymptomatic   HLD  Acute blood loss anemia  -Transfused 4 units PRBC at [facility]?  -Transfused 1 unit PRBC thus far at [facility] -Transfuse additional 2 unit PRBC 7/8  Recent Labs Lab 11/21/15 0503 11/22/15 0540 11/23/15 0451 11/24/15 1007 11/25/15 0520  HGB 7.9* 8.3* 8.2* 8.2* 7.6*    DM2 -5/23 A1c 10.5 - CBG not yet at goal - adjust tx again today  CBG (  last 3)   Recent Labs  11/25/15 1740 11/25/15 2232 11/26/15 0624  GLUCAP 148* 180* 118*   Hypomagnesemia -replace and follow   Hypothyroidism -TSH 1.861  Hypokalemia - replete orally and in IV  Anxiety/Depression Stable  Neurogenic Bladder -Chronic foley - placed at [facility]  DVT prophylaxis: SCDs Code Status: Full Family Communication:  Disposition Plan: SNF placement  Consultants:   PCCM Nephrology  Cardiology  ID Orthopedics  Procedures/Significant Events:  06/09 - right hip hemiarthroplasty 06/27 - admitted to [facility] with infected right hip - taken to OR for I&D and spacer implantation 06/29 - Worsened renal function - Transfused 4 units PRBC?? - transferred to [facility] EKG 6/29: Junctional bradycardia. Port CXR 6/30: Right hilar fullness with silhouetting of the left hemidiaphragm suggestive of retrocardiac opacity. Globular heart with suggestion of cardiomegaly. 7/1 CRRT started > 7/2 TTE on 7/2 EF 60-65%. There may be mild aortic stenosis. The mitral annulus is calcified. Pulmonary arteries: PA peak pressure: 57 mm Hg (S). 7/2 transfuse 1 units PRBC 7/7 restarted eliquis, ID consultation 7/8 transfuse 2 units ordered PICC line, replaced Magnesium, working on SNF placement  Antimicrobials: Zozyn 06/27 > 7/1 Merrem 7/1 > Rifampin 7/6  Ceftriaxone 7/7  Subjective: Pt had PICC line placed yesterday and tolerated.    Objective: Filed Vitals:   11/26/15 0110 11/26/15 0205 11/26/15 0500 11/26/15 0540  BP: 137/57 120/56  129/59  Pulse: 65 65  62  Temp: 98.2 F (36.8 C) 98.3 F (36.8 C)  98.1 F (36.7 C)  TempSrc: Oral Oral  Oral  Resp: 16 16  16   Height:      Weight:   244 lb 7.8 oz (110.9 kg)   SpO2: 97% 96%  95%    Intake/Output Summary (Last 24 hours) at 11/26/15 0825 Last data filed at 11/26/15 0602  Gross per 24 hour  Intake   1055 ml  Output   2800 ml  Net  -1745 ml   Filed Weights   11/24/15 0400 11/25/15 2227 11/26/15 0500  Weight: 232 lb (105.235 kg) 231 lb 4.2 oz (104.9 kg) 244 lb 7.8 oz (110.9 kg)    Examination: General: No acute respiratory distress Lungs: Clear to auscultation bilaterally without wheeze Cardiovascular: Regular rate and rhythm without murmur gallop or rub  Abdomen: Nontender, nondistended, soft, bowel sounds positive, no rebound, no ascites, no appreciable mass Extremities: No significant cyanosis or  clubbing - trace B LE edema  CBC:  Recent Labs Lab 11/21/15 0503 11/22/15 0540 11/23/15 0451 11/24/15 1007 11/25/15 0520  WBC 12.7* 14.9* 12.7* 13.7* 9.8  NEUTROABS  --  12.6* 10.2*  --   --   HGB 7.9* 8.3* 8.2* 8.2* 7.6*  HCT 25.0* 26.2* 25.9* 24.8* 23.2*  MCV 91.9 94.6 92.2 92.5 92.8  PLT 321 293 334 311 305   Basic Metabolic Panel:  Recent Labs Lab 11/20/15 0405 11/21/15 0503 11/22/15 0540 11/23/15 0451 11/23/15 1445 11/24/15 1007 11/25/15 0520  NA 142 139 139 139  --   --  138  K 3.8 4.0 3.8 2.9* 3.2*  --  2.7*  CL 99* 107 103 102  --   --  99*  CO2 25 23 24  32  --   --  32  GLUCOSE 129* 135* 185* 166*  --   --  175*  BUN 7 8 10 7   --   --  8  CREATININE 1.11* 1.17* 1.22* 0.94  --   --  0.80  CALCIUM 8.5* 7.9*  8.1* 7.8*  --   --  7.6*  MG  --   --  1.5*  --  1.3* 1.6* 1.6*  PHOS  --  2.4* 2.3* 1.9* 2.7 2.3*  --    GFR: Estimated Creatinine Clearance: 69.4 mL/min (by C-G formula based on Cr of 0.8).   Liver Function Tests:  Recent Labs Lab 11/21/15 0503 11/22/15 0540 11/23/15 0451  ALBUMIN 1.7* 1.9* 1.8*   Coagulation Profile: No results for input(s): INR, PROTIME in the last 168 hours.   Cardiac Enzymes:  Recent Labs Lab 11/24/15 0050 11/24/15 0545 11/24/15 1007 11/25/15 0520  TROPONINI 0.03* 0.03* 0.05* 0.05*   CBG:  Recent Labs Lab 11/25/15 0614 11/25/15 1236 11/25/15 1740 11/25/15 2232 11/26/15 0624  GLUCAP 172* 185* 148* 180* 118*    Recent Results (from the past 240 hour(s))  MRSA PCR Screening     Status: None   Collection Time: 11/16/15 10:06 PM  Result Value Ref Range Status   MRSA by PCR NEGATIVE NEGATIVE Final    Comment:        The GeneXpert MRSA Assay (FDA approved for NASAL specimens only), is one component of a comprehensive MRSA colonization surveillance program. It is not intended to diagnose MRSA infection nor to guide or monitor treatment for MRSA infections.      Scheduled Meds: . amLODipine  10 mg  Oral Daily  . antiseptic oral rinse  7 mL Mouth Rinse q12n4p  . apixaban  5 mg Oral BID  . atorvastatin  20 mg Oral q1800  . cefTRIAXone (ROCEPHIN)  IV  2 g Intravenous Q24H  . chlorhexidine  15 mL Mouth Rinse BID  . darbepoetin (ARANESP) injection - NON-DIALYSIS  100 mcg Subcutaneous Q Wed-1800  . feeding supplement  1 Container Oral TID WC  . feeding supplement (PRO-STAT SUGAR FREE 64)  30 mL Oral TID  . furosemide  80 mg Oral Daily  . insulin aspart  0-9 Units Subcutaneous TID WC  . insulin glargine  18 Units Subcutaneous QHS  . levothyroxine  150 mcg Oral QAC breakfast  . pantoprazole  40 mg Oral Q1200  . potassium chloride  40 mEq Oral BID  . sertraline  50 mg Oral Daily    LOS: 10 days   Maryln Manuel, MD [facility] Office  [phone] Pager - Text Page per Amion as per below:  On-Call/Text Page:      [email or web address]      password [email or web address]  If 7PM-7AM, please contact night-coverage [email or web address] Password [email or web address] 11/26/2015, 8:25 AM

## 2015-11-26 NOTE — Clinical Social Work Note (Signed)
Patient transferred from [facility]. Handoff reported provided to assigned CSW. Patient to return to SNF, [facility] once medically stable for d/c.   CSW remains available as needed.   Derenda Fennel, MSW, LCSWA [phone] 11/26/2015 12:24 PM

## 2015-11-27 ENCOUNTER — Inpatient Hospital Stay (HOSPITAL_COMMUNITY): Payer: Medicare Other

## 2015-11-27 DIAGNOSIS — T8451XD Infection and inflammatory reaction due to internal right hip prosthesis, subsequent encounter: Secondary | ICD-10-CM

## 2015-11-27 LAB — GLUCOSE, CAPILLARY
GLUCOSE-CAPILLARY: 165 mg/dL — AB (ref 65–99)
GLUCOSE-CAPILLARY: 166 mg/dL — AB (ref 65–99)
GLUCOSE-CAPILLARY: 306 mg/dL — AB (ref 65–99)
Glucose-Capillary: 200 mg/dL — ABNORMAL HIGH (ref 65–99)

## 2015-11-27 MED ORDER — CEFTRIAXONE SODIUM 2 G IJ SOLR
2.0000 g | INTRAMUSCULAR | Status: AC
Start: 2015-11-28 — End: 2015-12-26

## 2015-11-27 MED ORDER — INSULIN GLARGINE 100 UNIT/ML ~~LOC~~ SOLN: 18.0000 [IU] | Freq: Every day | SUBCUTANEOUS | Status: DC

## 2015-11-27 MED ORDER — HEPARIN SOD (PORK) LOCK FLUSH 100 UNIT/ML IV SOLN
250.0000 [IU] | INTRAVENOUS | Status: DC | PRN
  Administered 2015-11-27: 250 [IU]
  Filled 2015-11-27: qty 3

## 2015-11-27 MED ORDER — BOOST / RESOURCE BREEZE PO LIQD: 1.0000 | Freq: Three times a day (TID) | ORAL | Status: DC

## 2015-11-27 MED ORDER — HEPARIN SOD (PORK) LOCK FLUSH 100 UNIT/ML IV SOLN
250.0000 [IU] | Freq: Every day | INTRAVENOUS | Status: DC
  Filled 2015-11-27: qty 2.5

## 2015-11-27 MED ORDER — PRO-STAT SUGAR FREE PO LIQD: 30.0000 mL | Freq: Three times a day (TID) | ORAL | Status: DC

## 2015-11-27 MED ORDER — POTASSIUM CHLORIDE CRYS ER 20 MEQ PO TBCR: 40.0000 meq | EXTENDED_RELEASE_TABLET | Freq: Two times a day (BID) | ORAL | Status: DC

## 2015-11-27 MED ORDER — POTASSIUM CHLORIDE CRYS ER 20 MEQ PO TBCR
20.0000 meq | EXTENDED_RELEASE_TABLET | Freq: Once | ORAL | Status: AC
  Administered 2015-11-27: 20 meq via ORAL
  Filled 2015-11-27: qty 1

## 2015-11-27 MED ORDER — FUROSEMIDE 10 MG/ML IJ SOLN
20.0000 mg | Freq: Once | INTRAMUSCULAR | Status: AC
  Administered 2015-11-27: 20 mg via INTRAVENOUS
  Filled 2015-11-27: qty 2

## 2015-11-27 MED ORDER — FUROSEMIDE 80 MG PO TABS: 80.0000 mg | ORAL_TABLET | Freq: Every day | ORAL | Status: DC

## 2015-11-27 NOTE — Discharge Instructions (Signed)
Continued wound care, remove HV drains and start dressing changes with wound care team. If incisional vac then would do weekly changes, if open then would do QOD changes.   Follow-up with orthopedic surgeon at [facility] to discuss possible replantation once clears infection  Follow up with primary care provider in 1 week for recheck.   ---------------------------------------------------------------------------------------------------------------------------------------------------------------------------------------------------------------------- Information on my medicine - ELIQUIS (apixaban)  Why was Eliquis prescribed for you? Eliquis was prescribed for you to reduce the risk of a blood clot forming that can cause a stroke if you have a medical condition called atrial fibrillation (a type of irregular heartbeat).  What do You need to know about Eliquis ? Take your Eliquis TWICE DAILY - one tablet in the morning and one tablet in the evening with or without food. If you have difficulty swallowing the tablet whole please discuss with your pharmacist how to take the medication safely.  Take Eliquis exactly as prescribed by your doctor and DO NOT stop taking Eliquis without talking to the doctor who prescribed the medication.  Stopping may increase your risk of developing a stroke.  Refill your prescription before you run out.  After discharge, you should have regular check-up appointments with your healthcare provider that is prescribing your Eliquis.  In the future your dose may need to be changed if your kidney function or weight changes by a significant amount or as you get older.  What do you do if you miss a dose? If you miss a dose, take it as soon as you remember on the same day and resume taking twice daily.  Do not take more than one dose of ELIQUIS at the same time to make up a missed dose.  Important Safety Information A possible side effect of Eliquis is bleeding. You  should call your healthcare provider right away if you experience any of the following: ? Bleeding from an injury or your nose that does not stop. ? Unusual colored urine (red or dark brown) or unusual colored stools (red or black). ? Unusual bruising for unknown reasons. ? A serious fall or if you hit your head (even if there is no bleeding).  Some medicines may interact with Eliquis and might increase your risk of bleeding or clotting while on Eliquis. To help avoid this, consult your healthcare provider or pharmacist prior to using any new prescription or non-prescription medications, including herbals, vitamins, non-steroidal anti-inflammatory drugs (NSAIDs) and supplements.  This website has more information on Eliquis (apixaban): http://www.eliquis.com/eliquis/home

## 2015-11-27 NOTE — Progress Notes (Signed)
Patient ID: [name] [name], female   DOB: Jan 24, 1935, 81 y.o.   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) Active Problems:   Postoperative wound infection of right hip   MSSA infection, non-invasive   Infection caused by Enterobacter cloacae   Sepsis (HCC)   AKI (acute kidney injury) (HCC)   Metabolic acidosis   Hypocalcemia   Acute respiratory failure with hypoxia (HCC)   Pressure ulcer   Acute renal failure (ARF) (HCC)   HCAP (healthcare-associated pneumonia)   Sinus bradycardia   Paroxysmal atrial fibrillation (HCC)   Acute blood loss anemia   Neurogenic bladder   Foreign body of hip with infection   Essential hypertension   Pulmonary hypertension (HCC)   Acute renal failure (HCC)   Uncontrolled type 2 diabetes mellitus with complication (HCC)   Stage 2 Pressure ulcer, sacrum   . amLODipine  10 mg Oral Daily  . antiseptic oral rinse  7 mL Mouth Rinse q12n4p  . apixaban  5 mg Oral BID  . atorvastatin  20 mg Oral q1800  . cefTRIAXone (ROCEPHIN)  IV  2 g Intravenous Q24H  . chlorhexidine  15 mL Mouth Rinse BID  . darbepoetin (ARANESP) injection - NON-DIALYSIS  100 mcg Subcutaneous Q Wed-1800  . feeding supplement  1 Container Oral TID WC  . feeding supplement (PRO-STAT SUGAR FREE 64)  30 mL Oral TID  . furosemide  20 mg Intravenous Once  . furosemide  80 mg Oral Daily  . insulin aspart  0-9 Units Subcutaneous TID WC  . insulin glargine  18 Units Subcutaneous QHS  . levothyroxine  150 mcg Oral QAC breakfast  . pantoprazole  40 mg Oral Q1200  . potassium chloride SA  20 mEq Oral Once  . potassium chloride  40 mEq Oral BID  . sertraline  50 mg Oral Daily    SUBJECTIVE: She is feeling a little bit better. She states that she is still having some stinging pain in her right  hip.  Review of Systems: Review of Systems  Constitutional: Negative for fever, chills and diaphoresis.  Musculoskeletal: Positive for joint pain.    Past Medical History  Diagnosis Date  . DM (diabetes mellitus) (HCC)   . PAF (paroxysmal atrial fibrillation) (HCC)     a. s/p dccv 04/2014;  b. amio/eliquis;  c. 06/2013 bb/ccb d/c 2/2 symptomatic bradycardia; d. 04/2015 recurrent AF->amio increased/bb resumed.  . Dyslipidemia   . CAD (coronary artery disease)     a. 2000 s/p PCI RCA;  b. 2005 PCI of RCA 2/2 ISR; c. 04/2013 Neg MV;  d. 05/2013 Cath: LM 20, LAD 30p, LCX 20p, OM1 90 small, RCA 59m (PTCA - FFR 0.86), PDA 40.  . Osteoarthritis   . Hypothyroidism     hx  . Rheumatoid arthritis(714.0)   . Obesity   . GERD (gastroesophageal reflux disease)   . Shoulder fracture, left     a. Dr. Martha Clan  . Cerebral infarction (HCC)   . CKD (chronic kidney disease), stage III   . Asthma   . Chronic diastolic CHF (congestive heart failure) (HCC)     a. 10/2013 Echo: EF 60-65%, no rwma, Gr1 DD, mild MR, mildly dil  LA.  . Hypertensive heart disease   . Right rib fracture     a. 04/2015.    Social History  Substance Use Topics  . Smoking status: Never Smoker   . Smokeless tobacco: Never Used     Comment: former passive smoker  . Alcohol Use: No    Family History  Problem Relation Age of Onset  . Esophageal cancer Mother   . Diabetes Mother   . Cancer Mother     esophageal    Allergies  Allergen Reactions  . Oxycodone Nausea And Vomiting    OBJECTIVE: Filed Vitals:   11/26/15 0540 11/26/15 1350 11/26/15 2152 11/27/15 0548  BP: 129/59 149/49 166/62 155/67  Pulse: 62 64 67 67  Temp: 98.1 F (36.7 C) 98.3 F (36.8 C) 98.1 F (36.7 C) 98.1 F (36.7 C)  TempSrc: Oral Oral Oral Oral  Resp: 16  16 16   Height:      Weight:    240 lb (108.863 kg)  SpO2: 95% 95% 92% 94%   Body mass index is 42.52 kg/(m^2).  Physical Exam  Constitutional: She is oriented to person, place,  and time.  She is resting quietly in bed watching television.  Musculoskeletal:  She has a VAC dressing on her right hip wound. There is no surrounding cellulitis or fluctuance.  Neurological: She is alert and oriented to person, place, and time.  Skin: No rash noted.  Left upper arm PICC site looks good.  Psychiatric: Mood and affect normal.    Lab Results Lab Results  Component Value Date   WBC 9.2 11/26/2015   HGB 9.7* 11/26/2015   HCT 30.7* 11/26/2015   MCV 91.1 11/26/2015   PLT 368 11/26/2015    Lab Results  Component Value Date   CREATININE 0.83 11/26/2015   BUN 7 11/26/2015   NA 137 11/26/2015   K 4.2 11/26/2015   CL 101 11/26/2015   CO2 30 11/26/2015    Lab Results  Component Value Date   ALT 13* 11/26/2015   AST 24 11/26/2015   ALKPHOS 142* 11/26/2015   BILITOT 0.8 11/26/2015     Microbiology: No results found for this or any previous visit (from the past 240 hour(s)).   ASSESSMENT: She is improving on therapy for MSSA and Enterobacter right prosthetic hip infection. Her prosthesis has been removed and she has a spacer in place. She needs 6 full weeks of IV antibiotic therapy through 12/25/2015.  PLAN: 1. Continue IV ceftriaxone through 12/25/2015 2. I will sign off now but please call if I can be of further assistance while she is here  02/24/2016, MD [facility] [phone] pager   [phone] cell 11/27/2015, 12:03 PM

## 2015-11-27 NOTE — Progress Notes (Signed)
Attempted report x2 at [facility]. Pt VSS. Will give report to [facility] and continue to monitor pt closely. Jillyn Hidden, RN

## 2015-11-27 NOTE — Progress Notes (Signed)
Report called to [facility]. VSS and pt has no complaints at this time.Will continue to monitor pt closely until [facility] arrrives. Jillyn Hidden, RN

## 2015-11-27 NOTE — Progress Notes (Signed)
Patient complain of chestpain and SOB, V/S is normal, o2sat is 94% on 3L nasal canula, EKG done and shows NSR, RN gave 3 dose of nitroglycerin, Patient stated it helped and feel better, Will continue to monitor and follow up with the RN in AM.

## 2015-11-27 NOTE — Care Management Important Message (Signed)
Important Message  Patient Details  Name: [name] [name] MRN: [id number] Date of Birth: Jan 31, 1935   Medicare Important Message Given:  Yes    Bernadette Hoit 11/27/2015, 9:42 AM

## 2015-11-27 NOTE — Discharge Summary (Addendum)
Physician Discharge Summary  [name] [name] QAS:[id number] DOB: [birth year] DOA: 11/16/2015  PCP: [name], MD  Admit date: 11/16/2015 Discharge date: 11/27/2015  Admitted From: [facility] Disposition: SNF [facility]  Recommendations for Outpatient Follow-up:  1. Follow up with PCP in 2 weeks 2. Follow up with orthopedic surgeon that performed her original surgery in 1 week to recheck wound and reassess 3. Please obtain BMP/CBC in one week 4. Please follow up with cardiologist in 2 weeks to recheck AFib.  5. Monitor blood sugars 4 times per day.  6. Wound care consult for Wound Vac Care recommended.   Pt is on long-term oxygen therapy 3L/min   Discharge Condition: Stable CODE STATUS: FULL Diet recommendation: Heart Healthy / Carb Modified  Brief/Interim Summary: 81 y.o. F Hx CVA, DM2, PAF, CAD, Chronic Diastolic CHF, HTN, Asthma, HLD, hypothyroidism, RA, CKD stage III, and a R Hip Hemiarthroplasty 10/27/15 complicated by infected surgical site. She was admitted to [facility] on 6/27 and underwent I&D of the right hip with implantation of spacer and wound vac and was started on empiric vanc / zosyn. In addition, she had severe bradycardia with HR in the 20's. She was seen in consultation by Cardiology who felt the bradycardia was related to amiodarone and metoprolol; therefore, these were discontinued and pt was started on Isuprel. Her hospital stay was complicated by anemia for which she received 4u PRBC transfusion along with worsening renal failure.  She was transferred to [facility] 6/29 for further evaluation and management. Since arrival at [facility], the pt has experienced resolving bradycardia from junctional rhythm > normal rate and rhythm. Weaned off dopamine 7/1 . Orthopedics has been followinggiven septic joint with wound VAC and spacer in place. Nephrology has been following for ongoing renal failure. UOP good, scr now at baseline. CRRT stopped 7/2. Had anemia for  which heparin drip has been dc'd.  7/8 PICC line placed, working on SNF placement  Sepsis due to MSSA, Enterobacter, Klebsiella infected right hip positive -S/P R Hip Hemiarthroplasty 6/9 -S/P I&D 6/27 w/ spacer & wound vac -Orthopedics evaluated at time of admit but not actively following  -Wound Care being managed by [facility] under direction of Ortho  -PT/OT as per Ortho recs -PICC line placement 7/8 for long term antibiotic therapy thru 12/26/15 -Per ID would need high dose ceftriaxone thru 12/26/15 -Plan to SNF when bed available.  - Pt to follow up with her orthopedist in 1 week for recheck. Continue current wound care. Pt has wound vac in place.   Stage 2 sacral pressure ulcer - continue current skin care protocol with frequent turns and barrier protection  Acute Renal Failure - resolved -baseline Cr 1.0-1.5 -Renal US negative hydronephrosis  -CRRT started 7/1 and ended on 7/2  -HD cath removed from L neck    Last Labs      Recent Labs Lab 11/20/15 0405 11/21/15 0503 11/22/15 0540 11/23/15 0451 11/25/15 0520  CREATININE 1.11* 1.17* 1.22* 0.94 0.80     Bradycardia  -Reportedly 3rd degree AVB at [facility] -Resolved  PAF  -Chadsvasc 9 -on Eliquis + Amio as outpatient  -Currently in NSR -Cardiology suggests resuming amiodarone at 200 mg daily at time of discharge and no beta blocker -resumed heparin 7/5 - w/ no evidence of blood loss, will transition back to Eliquis  -recommend [name] [name] follow up with her cardiologist in 1-2 weeks for recheck of Afib.   Chronic diastolic congestive heart failure -TTE 7/2 EF 60-65% - mild aortic stenosis, PA peak  pressure: 57 mm Hg (S) - well compensated at present  [facility] Weights   11/24/15 0400 11/25/15 2227 11/26/15 0500  Weight: 232 lb (105.235 kg) 231 lb 4.2 oz (104.9 kg) 244 lb 7.8 oz (110.9 kg)   HTN -BP improved   Pulmonary hypertension  -PA peak pressure: 57 mm Hg (S)  CAD  -asymptomatic   -NTG as needed for angina  HLD - resume home meds  Acute blood loss anemia  -Transfused 4 units PRBC at [place]?  -Transfused 1 unit PRBC thus far at [facility] -Transfuse additional 2 unit PRBC 7/8  Last Labs      Recent Labs Lab 11/21/15 0503 11/22/15 0540 11/23/15 0451 11/24/15 1007 11/25/15 0520  HGB 7.9* 8.3* 8.2* 8.2* 7.6*    Hemoglobin at discharge 9.5  DM2 -5/23 A1c 10.5 - CBG not yet at goal - adjust tx again today  CBG (last 3)   Recent Labs (last 2 labs)      Recent Labs  11/25/15 1740 11/25/15 2232 11/26/15 0624  GLUCAP 148* 180* 118*     Hypomagnesemia -replaced   Hypothyroidism -TSH 1.861  Hypokalemia - replete orally and in IV  Anxiety/Depression Stable  Neurogenic Bladder -Chronic foley - placed at [facility]  DVT prophylaxis: SCDs apixaban Code Status: Full Family Communication:  Disposition Plan: SNF placement  Consultants:  PCCM Nephrology  Cardiology  ID Orthopedics  Procedures/Significant Events:  06/09 - right hip hemiarthroplasty 06/27 - admitted to [facility] with infected right hip - taken to OR for I&D and spacer implantation 06/29 - Worsened renal function - Transfused 4 units PRBC?? - transferred to [facility] EKG 6/29: Junctional bradycardia. Port CXR 6/30: Right hilar fullness with silhouetting of the left hemidiaphragm suggestive of retrocardiac opacity. Globular heart with suggestion of cardiomegaly. 7/1 CRRT started > 7/2 TTE on 7/2 EF 60-65%. There may be mild aortic stenosis. The mitral annulus is calcified. Pulmonary arteries: PA peak pressure: 57 mm Hg (S). 7/2 transfuse 1 units PRBC 7/7 restarted eliquis, ID consultation 7/8 transfuse 2 units ordered PICC line, replaced Magnesium, working on SNF placement  Antimicrobials: Zozyn 06/27 > 7/1 Merrem 7/1 > Rifampin 7/6  Ceftriaxone 7/7 with instructions to take thru 12/26/15.      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) Active Problems:   Sepsis (HCC)   AKI (acute kidney injury) (HCC)   Metabolic acidosis   Hypocalcemia   Acute respiratory failure with hypoxia (HCC)   Pressure ulcer   Acute renal failure (ARF) (HCC)   HCAP (healthcare-associated pneumonia)   Sinus bradycardia   Postoperative wound infection of right hip   MSSA infection, non-invasive   Infection caused by Enterobacter cloacae   Paroxysmal atrial fibrillation (HCC)   Acute blood loss anemia   Neurogenic bladder   Foreign body of hip with infection   Essential hypertension   Pulmonary hypertension (HCC)   Acute renal failure (HCC)   Uncontrolled type 2 diabetes mellitus with complication (HCC)   Stage 2 Pressure ulcer, sacrum  Discharge Instructions  Discharge Instructions    Diet - low sodium heart healthy    Complete by:  As directed      Increase activity slowly    Complete by:  As directed             Medication List    STOP taking these medications        ALPRAZolam 0.25 MG tablet  Commonly known as:  Prudy Feeler  clindamycin 300 MG capsule  Commonly known as:  CLEOCIN     gabapentin 300 MG capsule  Commonly known as:  NEURONTIN     HYDROcodone-acetaminophen 5-325 MG tablet  Commonly known as:  NORCO/VICODIN     insulin aspart 100 UNIT/ML injection  Commonly known as:  novoLOG     KEFLEX 500 MG capsule  Generic drug:  cephALEXin     lisinopril 10 MG tablet  Commonly known as:  PRINIVIL,ZESTRIL     meclizine 25 MG tablet  Commonly known as:  ANTIVERT     metFORMIN 500 MG tablet  Commonly known as:  GLUCOPHAGE     metoprolol tartrate 25 MG tablet  Commonly known as:  LOPRESSOR     ondansetron 4 MG disintegrating tablet  Commonly known as:  ZOFRAN-ODT     torsemide 20 MG tablet  Commonly known as:  DEMADEX     traMADol 50 MG tablet  Commonly known as:  ULTRAM      TAKE these medications        ACIDOPHILUS PO  Take 1 tablet by mouth daily. 30  day course following joint replacement surgery     albuterol 108 (90 Base) MCG/ACT inhaler  Commonly known as:  PROVENTIL HFA;VENTOLIN HFA  Inhale 2 puffs into the lungs every 6 (six) hours as needed for wheezing or shortness of breath.     amiodarone 200 MG tablet  Commonly known as:  PACERONE  Take 1 tablet (200 mg total) by mouth daily.     amLODipine 10 MG tablet  Commonly known as:  NORVASC  Take 1 tablet (10 mg total) by mouth daily.     apixaban 5 MG Tabs tablet  Commonly known as:  ELIQUIS  Take 1 tablet (5 mg total) by mouth 2 (two) times daily.     atorvastatin 20 MG tablet  Commonly known as:  LIPITOR  Take 1 tablet (20 mg total) by mouth daily.     cefTRIAXone 2 g in dextrose 5 % 50 mL  Inject 2 g into the vein daily.  Start taking on:  11/28/2015     feeding supplement (PRO-STAT SUGAR FREE 64) Liqd  Take 30 mLs by mouth 3 (three) times daily.     feeding supplement Liqd  Take 1 Container by mouth 3 (three) times daily with meals.     ferrous sulfate 325 (65 FE) MG tablet  Take 325 mg by mouth daily with breakfast.     furosemide 80 MG tablet  Commonly known as:  LASIX  Take 1 tablet (80 mg total) by mouth daily.     insulin glargine 100 UNIT/ML injection  Commonly known as:  LANTUS  Inject 0.18 mLs (18 Units total) into the skin daily.     latanoprost 0.005 % ophthalmic solution  Commonly known as:  XALATAN  Place 1 drop into both eyes at bedtime.     levothyroxine 150 MCG tablet  Commonly known as:  SYNTHROID, LEVOTHROID  Take 1 tablet (150 mcg total) by mouth daily before breakfast.     nitroGLYCERIN 0.4 MG SL tablet  Commonly known as:  NITROSTAT  Place 0.4 mg under the tongue every 5 (five) minutes as needed for chest pain.     omeprazole 20 MG capsule  Commonly known as:  PRILOSEC  Take 1 capsule (20 mg total) by mouth 2 (two) times daily.     OXYGEN  Inhale 2 L into the lungs continuous.     potassium chloride  SA 20 MEQ tablet  Commonly  known as:  K-DUR,KLOR-CON  Take 2 tablets (40 mEq total) by mouth 2 (two) times daily.     RELION INSULIN SYR 0.5ML/31G 31G X 5/16" 0.5 ML Misc  Generic drug:  Insulin Syringe-Needle U-100     sertraline 50 MG tablet  Commonly known as:  ZOLOFT  Take 1 tablet (50 mg total) by mouth daily.           Follow-up Information    Follow up with Orthopedics Physician in [place], [place]. Schedule an appointment as soon as possible for a visit in 1 week.   Why:  Hospital Follow Up      Follow up with [name], MD. Schedule an appointment as soon as possible for a visit in 2 weeks.   Specialty:  Internal Medicine   Why:  Hospital Follow Up   Contact information:   [address] [place] [address] [phone]      Allergies  Allergen Reactions  . Oxycodone Nausea And Vomiting     Procedures/Studies: US Renal  11/17/2015  CLINICAL DATA:  Acute renal failure EXAM: RENAL / URINARY TRACT ULTRASOUND COMPLETE COMPARISON:  11/15/2015 FINDINGS: Right Kidney: Length: 11.5 cm. Echogenicity within normal limits. No mass or hydronephrosis visualized. Left Kidney: Length: 10.7 cm. Echogenicity within normal limits. No mass or hydronephrosis visualized. Bladder: Decompressed with Foley catheter in place IMPRESSION: No acute findings.  No hydronephrosis. Electronically Signed   By: Charlett Nose M.D.   On: 11/17/2015 12:56   Dg Chest Port 1 View  11/18/2015  CLINICAL DATA:  82 year old female with renal failure EXAM: PORTABLE CHEST 1 VIEW COMPARISON:  Chest radiograph dated 11/17/2015 FINDINGS: Right IJ central line with tip who in stable positioning. Left IJ dialysis catheter terminates at cavoatrial junction. The [name] [name] is rotated to the left. There is stable cardiomegaly with central vascular and interstitial prominence compatible with congestive changes. Left lung base opacity may represent atelectatic changes. Pneumonia is not excluded. Clinical correlation is  recommended. Small left pleural effusion may be present. There is osteopenia with degenerative changes of the spine. No acute fracture. IMPRESSION: Cardiomegaly with congestive changes, new from prior study. Left lung base atelectasis versus infiltrate. Electronically Signed   By: Elgie Collard M.D.   On: 11/18/2015 00:37   Dg Chest Port 1 View  11/17/2015  CLINICAL DATA:  Respiratory failure, sepsis, CHF. EXAM: PORTABLE CHEST 1 VIEW COMPARISON:  Portable chest x-ray dated November 14, 2015 FINDINGS: The lungs are adequately inflated. The interstitial markings are mildly increased. The pulmonary vascularity is prominent. The cardial pericardial silhouette is enlarged. There is partial obscuration of the left hemidiaphragm. The right internal jugular venous catheter tip projects over the midportion of the SVC. IMPRESSION: Allowing for differences in radiographic technique there has been mild interval deterioration in the pulmonary interstitium suggesting increasing interstitial edema. Stable cardiomegaly. Slight increased atelectasis or infiltrate at the left lung base. Electronically Signed   By: David  [place] M.D.   On: 11/17/2015 07:23     Subjective: Pt eating well.  No complaints.  Pt says that she is feeling better.  Wound vac still draining brown liquid.   Discharge Exam: Filed Vitals:   11/26/15 2152 11/27/15 0548  BP: 166/62 155/67  Pulse: 67 67  Temp: 98.1 F (36.7 C) 98.1 F (36.7 C)  Resp: 16 16   Filed Vitals:   11/26/15 0540 11/26/15 1350 11/26/15 2152 11/27/15 0548  BP: 129/59 149/49 166/62 155/67  Pulse: 62 64 67 67  Temp: 98.1 F (36.7 C) 98.3 F (36.8 C) 98.1 F (36.7 C) 98.1 F (36.7 C)  TempSrc: Oral Oral Oral Oral  Resp: 16  16 16   Height:      Weight:    240 lb (108.863 kg)  SpO2: 95% 95% 92% 94%    General: No acute respiratory distress Lungs: Clear to auscultation bilaterally without wheeze Cardiovascular: Regular rate and rhythm without murmur gallop or  rub  Abdomen: Nontender, nondistended, soft, bowel sounds positive, no rebound, no ascites, no appreciable mass Extremities: No significant cyanosis or clubbing - trace B LE edema,  Wound vac on right draining brown liquid.  The results of significant diagnostics from this hospitalization (including imaging, microbiology, ancillary and laboratory) are listed below for reference.     Microbiology: No results found for this or any previous visit (from the past 240 hour(s)).   Labs: BNP (last 3 results)  Recent Labs  07/19/15 1316  BNP 316.0*   Basic Metabolic Panel:  Recent Labs Lab 11/21/15 0503 11/22/15 0540 11/23/15 0451 11/23/15 1445 11/24/15 1007 11/25/15 0520 11/26/15 1110  NA 139 139 139  --   --  138 137  K 4.0 3.8 2.9* 3.2*  --  2.7* 4.2  CL 107 103 102  --   --  99* 101  CO2 23 24 32  --   --  32 30  GLUCOSE 135* 185* 166*  --   --  175* 142*  BUN 8 10 7   --   --  8 7  CREATININE 1.17* 1.22* 0.94  --   --  0.80 0.83  CALCIUM 7.9* 8.1* 7.8*  --   --  7.6* 8.1*  MG  --  1.5*  --  1.3* 1.6* 1.6* 1.8  PHOS 2.4* 2.3* 1.9* 2.7 2.3*  --   --    Liver Function Tests:  Recent Labs Lab 11/21/15 0503 11/22/15 0540 11/23/15 0451 11/26/15 1110  AST  --   --   --  24  ALT  --   --   --  13*  ALKPHOS  --   --   --  142*  BILITOT  --   --   --  0.8  PROT  --   --   --  5.5*  ALBUMIN 1.7* 1.9* 1.8* 1.8*   No results for input(s): LIPASE, AMYLASE in the last 168 hours. No results for input(s): AMMONIA in the last 168 hours. CBC:  Recent Labs Lab 11/22/15 0540 11/23/15 0451 11/24/15 1007 11/25/15 0520 11/26/15 1110  WBC 14.9* 12.7* 13.7* 9.8 9.2  NEUTROABS 12.6* 10.2*  --   --   --   HGB 8.3* 8.2* 8.2* 7.6* 9.7*  HCT 26.2* 25.9* 24.8* 23.2* 30.7*  MCV 94.6 92.2 92.5 92.8 91.1  PLT 293 334 311 305 368   Cardiac Enzymes:  Recent Labs Lab 11/24/15 0050 11/24/15 0545 11/24/15 1007 11/25/15 0520  TROPONINI 0.03* 0.03* 0.05* 0.05*   BNP: Invalid  input(s): POCBNP CBG:  Recent Labs Lab 11/26/15 1057 11/26/15 1622 11/26/15 2156 11/27/15 0616 11/27/15 0730  GLUCAP 141* 194* 223* 200* 165*   D-Dimer No results for input(s): DDIMER in the last 72 hours. Hgb A1c No results for input(s): HGBA1C in the last 72 hours. Lipid Profile No results for input(s): CHOL, HDL, LDLCALC, TRIG, CHOLHDL, LDLDIRECT in the last 72 hours. Thyroid function studies No results for input(s): TSH, T4TOTAL, T3FREE, THYROIDAB in the last 72 hours.  Invalid input(s): FREET3 Anemia work up No results for input(s): VITAMINB12, FOLATE, FERRITIN, TIBC, IRON, RETICCTPCT in the last 72 hours. Urinalysis    Component Value Date/Time   COLORURINE AMBER* 11/17/2015 1140   COLORURINE Yellow 05/16/2014 1433   APPEARANCEUR TURBID* 11/17/2015 1140   APPEARANCEUR Hazy 05/16/2014 1433   LABSPEC 1.033* 11/17/2015 1140   LABSPEC 1.006 05/16/2014 1433   PHURINE 5.0 11/17/2015 1140   PHURINE 5.0 05/16/2014 1433   GLUCOSEU NEGATIVE 11/17/2015 1140   GLUCOSEU Negative 05/16/2014 1433   HGBUR LARGE* 11/17/2015 1140   HGBUR Negative 05/16/2014 1433   BILIRUBINUR SMALL* 11/17/2015 1140   BILIRUBINUR Negative 05/16/2014 1433   BILIRUBINUR Negative 10/06/2013 1353   KETONESUR 15* 11/17/2015 1140   KETONESUR Negative 05/16/2014 1433   PROTEINUR 30* 11/17/2015 1140   PROTEINUR Negative 05/16/2014 1433   PROTEINUR 100 10/06/2013 1353   UROBILINOGEN 0.2 06/25/2014 1725   UROBILINOGEN 0.2 10/06/2013 1353   NITRITE NEGATIVE 11/17/2015 1140   NITRITE Negative 05/16/2014 1433   NITRITE Negative 10/06/2013 1353   LEUKOCYTESUR LARGE* 11/17/2015 1140   LEUKOCYTESUR Negative 05/16/2014 1433   Sepsis Labs Invalid input(s): PROCALCITONIN,  WBC,  LACTICIDVEN Microbiology No results found for this or any previous visit (from the past 240 hour(s)).   Time coordinating discharge: 40 minutes  SIGNED:   Standley Dakins, MD  [facility] 11/27/2015, 8:31 AM Pager    If 7PM-7AM, please contact night-coverage [email or web address] Password [email or web address]

## 2015-11-27 NOTE — Clinical Social Work Note (Addendum)
Patient agreeable to discharge to [facility]. Awaiting insurance authorization from [facility]. Patient to be transported via [facility].  RN report number: [phone]  Marcelline Deist, LCSW [phone] Orthopedics: [phone] Surgical: [phone]

## 2015-11-27 NOTE — Progress Notes (Signed)
Physical Therapy Treatment Patient Details Name: [name] [name] MRN: [id number] DOB: September 05, 1934 Today's Date: 11/27/2015    History of Present Illness 81 y.o. female with PMH including recent right hip hemiarthroplasty roughly 10/27/15 (due to hip fracture) complicated by infected surgical site. She was admitted again to [facility] on 6/27 and underwent I&D of the right hip with implantation of spacer and wound vac. Transferred to [facility] 6/30 due to worsening renal failure, bradycardia, hypotension. Initiated CRRT 6/30. Last CRRT 7/2 PMHx-- PAF, CAD, chronic diastolic CHF (EF 48-88% by echo in 07/2015), Stage 3 CKD, COPD, HTN, and HLD    PT Comments    Pt performed supine LE therapeutic exercise and HEP issued for continued strengthening at d/c.  Pt educated on importance of mobility and need to participate with PT during session.  Pt agreeable and demonstrated improved effort for bed to chair transfer.  Pt required posterior scoot from bed to chair due to inability to stand and inability to maintain weight bearing.  Pt required +2-+3 assistance at this time.  Pt will continue to benefit from [facility] re[facility] at d/c to improve strength and promote functional independence.     Follow Up Recommendations  SNF;Supervision/Assistance - 24 hour     Equipment Recommendations  Other (comment) (TBD at SNF)    Recommendations for Other Services       Precautions / Restrictions Precautions Precautions: Other (comment);Fall;Posterior Hip Precaution Comments: Per Dr. Eulah Pont pt can participate in P/AA/AROM exercises.  He wants main focus to be strict TDWB w/ RW.  Pt unable to recall any hip precautions, educated pt on these at start of session. Restrictions Weight Bearing Restrictions: Yes RLE Weight Bearing: Touchdown weight bearing    Mobility  Bed Mobility Overal bed mobility: Needs Assistance Bed Mobility: Supine to Sit     Supine to sit: Max assist;+2 for physical assistance     General bed  mobility comments: Pt needed assist for elevation of trunk and for LEs.Used pad to get pts hips to EOB.   Transfers Overall transfer level: Needs assistance   Transfers: Anterior-Posterior Transfer Sit to Stand: +2 physical assistance;Max assist (+3 for management of LE, performed AP transfer due to weakness and innability to maintain TDWB on RLE and lift hips from bed.  )         General transfer comment: Pt able to assist minimally with B UEs to scoot posterior from bed to chair.    Ambulation/Gait Ambulation/Gait assistance:  (unable due to weakness and weight bearing.  )               Stairs            Wheelchair Mobility    Modified Rankin (Stroke Patients Only)       Balance Overall balance assessment: Needs assistance   Sitting balance-Leahy Scale: Poor       Standing balance-Leahy Scale: Zero                      Cognition Arousal/Alertness: Awake/alert Behavior During Therapy: WFL for tasks assessed/performed Overall Cognitive Status: No family/caregiver present to determine baseline cognitive functioning         Following Commands: Follows one step commands consistently            Exercises Total Joint Exercises Ankle Circles/Pumps: AROM;Both;10 reps;Supine Quad Sets: AROM;Right;10 reps;Supine Short Arc Quad: Right;10 reps;Supine;AAROM Heel Slides: AAROM;Right;10 reps;Supine Hip ABduction/ADduction: AAROM;Right;10 reps;Supine    General Comments  Pertinent Vitals/Pain Pain Assessment: Faces Pain Score: 4  Pain Descriptors / Indicators: Grimacing;Guarding;Moaning Pain Intervention(s): Monitored during session;Repositioned    Home Living                      Prior Function            PT Goals (current goals can now be found in the care plan section) Acute Rehab PT Goals Patient Stated Goal: wants to get well enough to have Lt TKR (was scheduled for this July) Potential to Achieve Goals:  Fair Additional Goals Additional Goal #1: Patient will state any restrictions to Rt hip 100% of time. Additional Goal #2: Patient will maintain SaO2>90% during bil UE and LLE bed exercises x 15 minutes Progress towards PT goals: Progressing toward goals (slowly.)    Frequency  Min 3X/week    PT Plan Frequency needs to be updated    Co-evaluation             End of Session Equipment Utilized During Treatment: Oxygen (bed pad.  ) Activity Tolerance: Patient limited by fatigue;Patient limited by pain Patient left: in bed;with call bell/phone within reach;with SCD's reapplied     Time: 1329-1409 PT Time Calculation (min) (ACUTE ONLY): 40 min  Charges:  $Therapeutic Exercise: 8-22 mins $Therapeutic Activity: 23-37 mins                    G Codes:      Florestine Avers December 22, 2015, 2:32 PM Joycelyn Rua, PTA pager [phone]

## 2015-11-29 LAB — TYPE AND SCREEN
ABO/RH(D): O POS
Antibody Screen: NEGATIVE
UNIT DIVISION: 0
UNIT DIVISION: 0

## 2015-12-25 ENCOUNTER — Ambulatory Visit: Payer: Medicare Other | Admitting: Internal Medicine

## 2016-01-10 ENCOUNTER — Ambulatory Visit: Payer: Medicare Other | Admitting: Internal Medicine

## 2016-01-12 ENCOUNTER — Ambulatory Visit: Payer: Commercial Managed Care - HMO

## 2016-02-01 ENCOUNTER — Ambulatory Visit (INDEPENDENT_AMBULATORY_CARE_PROVIDER_SITE_OTHER): Payer: Medicare Other | Admitting: Internal Medicine

## 2016-02-01 ENCOUNTER — Encounter: Payer: Self-pay | Admitting: Internal Medicine

## 2016-02-01 DIAGNOSIS — T8451XA Infection and inflammatory reaction due to internal right hip prosthesis, initial encounter: Secondary | ICD-10-CM

## 2016-02-01 NOTE — Progress Notes (Addendum)
[facility]  Patient Active Problem List   Diagnosis Date Noted  . 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 Postoperative wound infection of right hip     Priority: High  . MSSA infection, non-invasive     Priority: High  . Infection caused by Enterobacter cloacae     Priority: High  . Stage 2 Pressure ulcer, sacrum 11/25/2015  . Foreign body of hip with infection   . Essential hypertension   . Pulmonary hypertension (HCC)   . Acute renal failure (HCC)   . Uncontrolled type 2 diabetes mellitus with complication (HCC)   . Paroxysmal atrial fibrillation (HCC)   . Acute blood loss anemia   . Neurogenic bladder   . Sinus bradycardia 11/20/2015  . HCAP (healthcare-associated pneumonia)   . Acute renal failure (ARF) (HCC)   . Pressure ulcer 11/17/2015  . Sepsis (HCC) 11/16/2015  . AKI (acute kidney injury) (HCC)   . Metabolic acidosis   . Hypocalcemia   . Acute respiratory failure with hypoxia (HCC)   . Hypertensive heart disease   . Acute on chronic diastolic CHF (congestive heart failure) (HCC)   . Coronary artery disease involving native coronary artery of native heart without angina pectoris   . Persistent atrial fibrillation (HCC)   . Dyspnea 07/18/2015  . Follicular acne 07/07/2015  . Acute diastolic heart failure (HCC) 06/16/2015  . Routine history and physical examination of adult 01/13/2015  . Dermatitis 06/27/2014  . CKD (chronic kidney disease), stage III   . Bradycardia 06/24/2014  . Diabetes mellitus type 2, uncontrolled (HCC) 03/25/2014  . Depression 03/25/2014  . Medicare annual wellness visit, subsequent 01/10/2014  . Anxiety state 01/10/2014  . Carotid stenosis 11/16/2013  . CVA (cerebral infarction) 10/21/2013  . Screening for breast cancer 01/14/2013  . Poorly controlled type 2 diabetes mellitus with circulatory disorder (HCC) 02/04/2012  . Chronic UTI 06/12/2011  . GERD  (gastroesophageal reflux disease) 06/12/2011  . Hyperlipidemia 06/12/2011  . Hypertension 05/17/2011  . Hypothyroidism 03/07/2011  . Chronic back pain 02/21/2011  . Morbid obesity (HCC) 08/17/2008  . CAD (coronary artery disease) 08/17/2008  . Atrial fibrillation (HCC) 08/17/2008  . Osteoarthritis, knee 08/17/2008    Patient's Medications  New Prescriptions   No medications on file  Previous Medications   ALBUTEROL (PROVENTIL HFA;VENTOLIN HFA) 108 (90 BASE) MCG/ACT INHALER    Inhale 2 puffs into the lungs every 6 (six) hours as needed for wheezing or shortness of breath.   AMINO ACIDS-PROTEIN HYDROLYS (FEEDING SUPPLEMENT, PRO-STAT SUGAR FREE 64,) LIQD    Take 30 mLs by mouth 3 (three) times daily.   AMIODARONE (PACERONE) 200 MG TABLET    Take 1 tablet (200 mg total) by mouth daily.   AMLODIPINE (NORVASC) 10 MG TABLET    Take 1 tablet (10 mg total) by mouth daily.   APIXABAN (ELIQUIS) 5 MG TABS TABLET    Take 1 tablet (5 mg total) by mouth 2 (two) times daily.   ATORVASTATIN (LIPITOR) 20 MG TABLET    Take 1 tablet (20 mg total) by mouth daily.   FEEDING SUPPLEMENT (BOOST / RESOURCE BREEZE) LIQD    Take 1 Container by mouth 3 (three) times daily with meals.   FERROUS SULFATE 325 (65 FE) MG TABLET    Take 325 mg by mouth daily with breakfast.   FUROSEMIDE (LASIX) 80 MG TABLET    Take 1  tablet (80 mg total) by mouth daily.   INSULIN GLARGINE (LANTUS) 100 UNIT/ML INJECTION    Inject 0.18 mLs (18 Units total) into the skin daily.   LACTOBACILLUS (ACIDOPHILUS PO)    Take 1 tablet by mouth daily. 30 day course following joint replacement surgery   LATANOPROST (XALATAN) 0.005 % OPHTHALMIC SOLUTION    Place 1 drop into both eyes at bedtime.    LEVOTHYROXINE (SYNTHROID, LEVOTHROID) 150 MCG TABLET    Take 1 tablet (150 mcg total) by mouth daily before breakfast.   NITROGLYCERIN (NITROSTAT) 0.4 MG SL TABLET    Place 0.4 mg under the tongue every 5 (five) minutes as needed for chest pain.   OMEPRAZOLE  (PRILOSEC) 20 MG CAPSULE    Take 1 capsule (20 mg total) by mouth 2 (two) times daily.   OXYGEN    Inhale 2 L into the lungs continuous.   POTASSIUM CHLORIDE SA (K-DUR,KLOR-CON) 20 MEQ TABLET    Take 2 tablets (40 mEq total) by mouth 2 (two) times daily.   RELION INSULIN SYR 0.5ML/31G 31G X 5/16" 0.5 ML MISC       SERTRALINE (ZOLOFT) 50 MG TABLET    Take 1 tablet (50 mg total) by mouth daily.  Modified Medications   No medications on file  Discontinued Medications   No medications on file    Subjective: [name] [name] is in for her hospital follow-up visit. In late June she developed right prosthetic hip infection and sepsis. She underwent surgery at [facility]. She had incision and drainage and all hardware removed with spacer placement. Operative cultures grew MSSA and Enterobacter. She was transferred to [facility] where I saw her in consultation. I recommended 6 weeks of treatment with IV ceftriaxone through 12/26/2015. She was seen back in her surgeon's office on 12/25/2015 with evidence of ongoing wound infection. Apparently she was readmitted [facility] and underwent a second surgery to debride the hip on 12/28/2015. All cultures were negative. It appears she was discharged on IV imipenem which he continues to take. She has now completed 79 days of total antibiotic therapy. I do not have any records from her nursing home or her surgeon. She states that one area of the proximal wound is still open and bandaged but not draining. She has not had any fever, chills or sweats. She's had no problems tolerating her left arm PICC or her imipenem.  Review of Systems: Review of Systems  Constitutional: Negative for chills, diaphoresis, fever, malaise/fatigue and weight loss.  HENT: Negative for sore throat.   Respiratory: Negative for cough, sputum production and shortness of breath.   Cardiovascular: Negative for chest pain.  Gastrointestinal: Negative for abdominal pain, diarrhea,  nausea and vomiting.  Genitourinary: Negative for dysuria.  Musculoskeletal: Positive for joint pain. Negative for myalgias.  Skin: Negative for rash.  Neurological: Negative for headaches.    Past Medical History:  Diagnosis Date  . Asthma   . CAD (coronary artery disease)    a. 2000 s/p PCI RCA;  b. 2005 PCI of RCA 2/2 ISR; c. 04/2013 Neg MV;  d. 05/2013 Cath: LM 20, LAD 30p, LCX 20p, OM1 90 small, RCA 26m (PTCA - FFR 0.86), PDA 40.  Marland Kitchen Cerebral infarction (HCC)   . Chronic diastolic CHF (congestive heart failure) (HCC)    a. 10/2013 Echo: EF 60-65%, no rwma, Gr1 DD, mild MR, mildly dil LA.  . CKD (chronic kidney disease), stage III   . DM (diabetes mellitus) (HCC)   .  Dyslipidemia   . GERD (gastroesophageal reflux disease)   . Hypertensive heart disease   . Hypothyroidism    hx  . Obesity   . Osteoarthritis   . PAF (paroxysmal atrial fibrillation) (HCC)    a. s/p dccv 04/2014;  b. amio/eliquis;  c. 06/2013 bb/ccb d/c 2/2 symptomatic bradycardia; d. 04/2015 recurrent AF->amio increased/bb resumed.  . Rheumatoid arthritis(714.0)   . Right rib fracture    a. 04/2015.  Marland Kitchen Shoulder fracture, left    a. Dr. Martha Clan    Social History  Substance Use Topics  . Smoking status: Never Smoker  . Smokeless tobacco: Never Used     Comment: former passive smoker  . Alcohol use No    Family History  Problem Relation Age of Onset  . Esophageal cancer Mother   . Diabetes Mother   . Cancer Mother     esophageal     Allergies  Allergen Reactions  . Oxycodone Nausea And Vomiting    Objective: Vitals:   02/01/16 1031  BP: (!) 150/72  Pulse: 62  Temp: 98.2 F (36.8 C)  TempSrc: Oral   There is no height or weight on file to calculate BMI.  Physical Exam  Constitutional: She is oriented to person, place, and time.  She looks much better than when I saw her in the hospital. She is seated in a wheelchair and states that she is unable to stand.  Cardiovascular: Normal rate and  regular rhythm.   No murmur heard. Pulmonary/Chest: Effort normal and breath sounds normal.  Abdominal: Soft. There is no tenderness.  Musculoskeletal:  Unable to examine right hip.  Neurological: She is alert and oriented to person, place, and time.  Skin: No rash noted.  Left arm PICC site looks good.  Psychiatric: Mood and affect normal.    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 I will try to obtain those records and review the situation with her surgeon. I will repeat inflammatory markers today. I will continue imipenem for now.       Other Visit Diagnoses   None.      Cliffton Asters, MD [facility] [phone] pager   [phone] cell 02/01/2016, 10:55 AM   Addendum:  I reviewed office records from Dr. Carnella Guadalajara, [name] [name] orthopedic surgeon. She was seen by his PA, [name] [name] on 01/30/2016. There office note indicated that there was no evidence of active wound infection. [name] [name] has now had a total of 84 days of IV antibiotic therapy for her right prosthetic hip infection. All hardware was removed and a spacer was placed. She has had 2 incision and drainage procedures in the past 3 months. I will give an order to have her pick removed and stop antibiotic therapy now.  Cliffton Asters, MD [facility] for Infectious Disease [facility] [phone] pager   [phone] cell 02/06/2016, 10:22 AM

## 2016-02-01 NOTE — Assessment & Plan Note (Signed)
I'm hopeful that her prosthetic hip infection has been cured now but it is difficult to say without a full examination and records from her orthopedic surgeon. I will try to obtain those records and review the situation with her surgeon. I will repeat inflammatory markers today. I will continue imipenem for now.

## 2016-02-02 LAB — SEDIMENTATION RATE: SED RATE: 42 mm/h — AB (ref 0–30)

## 2016-02-02 LAB — C-REACTIVE PROTEIN: CRP: 17.7 mg/L — AB (ref <8.0)

## 2016-02-06 ENCOUNTER — Telehealth: Payer: Self-pay | Admitting: *Deleted

## 2016-02-06 NOTE — Telephone Encounter (Signed)
-----   Message from Cliffton Asters, MD sent at 02/06/2016 10:17 AM EDT ----- I reviewed office records from Dr. Carnella Guadalajara, [name] orthopedic surgeon. She was seen by his PA, [name] [name] on 01/30/2016. There office note indicated that there was no evidence of active wound infection. [name] [name] has now had a total of 84 days of IV antibiotic therapy for her right prosthetic hip infection. All hardware was removed and a spacer was placed. She has had 2 incision and drainage procedures in the past 3 months. I will give an order to have her pick removed and stop antibiotic therapy now.

## 2016-02-06 NOTE — Telephone Encounter (Signed)
Per Dr Orvan Falconer called the nursing home the patient is listed as staying and not able to reach as the line is not in service. Called the patient daughter to verify the number and was given the same number I called which is out of order. Will try again tomorrow in hopes that this is just a glitch in the line.

## 2016-02-08 ENCOUNTER — Telehealth: Payer: Self-pay

## 2016-02-08 NOTE — Telephone Encounter (Signed)
-----   Message from John Campbell, MD sent at 02/06/2016 10:17 AM EDT ----- I reviewed office records from Dr. Kyle Hubler, [name] [name] orthopedic surgeon. She was seen by his PA, [name] [name] on 01/30/2016. There office note indicated that there was no evidence of active wound infection. [name] [name] has now had a total of 84 days of IV antibiotic therapy for her right prosthetic hip infection. All hardware was removed and a spacer was placed. She has had 2 incision and drainage procedures in the past 3 months. I will give an order to have her pick removed and stop antibiotic therapy now. 

## 2016-02-08 NOTE — Telephone Encounter (Signed)
I have tried the phone number for the rehab center and the number is not in service. [phone] I have also tried to contact the patient and her daughter without success.

## 2016-02-08 NOTE — Telephone Encounter (Signed)
Retried the number listed and still not a working number. Called the patient home number and he daughter advised she is not sure what is going on but will have someone from the facility to give our office a call the next time she is there.  Will wait for call but will mail the order to the facility in the mean time as well.

## 2016-02-23 NOTE — Telephone Encounter (Signed)
Called Rehab center and the line rang several times but no one ever answered. Still unable to reach anyone to have PICC D/C or to verify the fax number so that I could fax orders.

## 2016-03-14 ENCOUNTER — Ambulatory Visit: Payer: Medicare Other | Admitting: Internal Medicine

## 2016-03-28 ENCOUNTER — Telehealth: Payer: Self-pay | Admitting: *Deleted

## 2016-03-28 NOTE — Telephone Encounter (Signed)
Jasmine December at Dr [facility] office calling to request last office note. She already has the note from September, was looking for the October visit (patient no-showed this appointment).  Per chart review, there was difficulty getting in touch with the patient/her daughter/the rehab facility to give the order to stop antibiotics and pull PICC.  RN confirmed with Jasmine December that the IV antibiotics were stopped and the PICC was pulled on 10/3.   Please advise if patient needs to follow up at [facility].  Patient is now at home (discharged from SNF), her contact number is [phone] Renee Rival - [phone] [id number] Andree Coss, RN

## 2016-03-29 NOTE — Telephone Encounter (Signed)
Thanks! Relayed information to [place] at Dr [employer] office.

## 2016-03-29 NOTE — Telephone Encounter (Signed)
No F/U needed unless requested by Dr. Mardene Speak.

## 2016-05-22 DIAGNOSIS — I481 Persistent atrial fibrillation: Secondary | ICD-10-CM | POA: Diagnosis not present

## 2016-05-22 DIAGNOSIS — J069 Acute upper respiratory infection, unspecified: Secondary | ICD-10-CM | POA: Diagnosis not present

## 2016-05-22 DIAGNOSIS — I10 Essential (primary) hypertension: Secondary | ICD-10-CM | POA: Diagnosis not present

## 2016-05-22 DIAGNOSIS — E038 Other specified hypothyroidism: Secondary | ICD-10-CM | POA: Diagnosis not present

## 2016-06-17 DIAGNOSIS — J069 Acute upper respiratory infection, unspecified: Secondary | ICD-10-CM | POA: Diagnosis not present

## 2016-07-01 DIAGNOSIS — Z01818 Encounter for other preprocedural examination: Secondary | ICD-10-CM | POA: Diagnosis not present

## 2016-07-01 DIAGNOSIS — Z01810 Encounter for preprocedural cardiovascular examination: Secondary | ICD-10-CM | POA: Diagnosis not present

## 2016-07-01 DIAGNOSIS — Z79899 Other long term (current) drug therapy: Secondary | ICD-10-CM | POA: Diagnosis not present

## 2016-07-01 DIAGNOSIS — M79609 Pain in unspecified limb: Secondary | ICD-10-CM | POA: Diagnosis not present

## 2016-07-01 DIAGNOSIS — R52 Pain, unspecified: Secondary | ICD-10-CM | POA: Diagnosis not present

## 2016-07-02 DIAGNOSIS — X58XXXD Exposure to other specified factors, subsequent encounter: Secondary | ICD-10-CM | POA: Diagnosis not present

## 2016-07-02 DIAGNOSIS — M25551 Pain in right hip: Secondary | ICD-10-CM | POA: Diagnosis not present

## 2016-07-02 DIAGNOSIS — T8451XD Infection and inflammatory reaction due to internal right hip prosthesis, subsequent encounter: Secondary | ICD-10-CM | POA: Diagnosis not present

## 2016-07-15 DIAGNOSIS — Z5181 Encounter for therapeutic drug level monitoring: Secondary | ICD-10-CM | POA: Diagnosis not present

## 2016-07-15 DIAGNOSIS — I481 Persistent atrial fibrillation: Secondary | ICD-10-CM | POA: Diagnosis not present

## 2016-07-15 DIAGNOSIS — R221 Localized swelling, mass and lump, neck: Secondary | ICD-10-CM | POA: Diagnosis not present

## 2016-07-15 DIAGNOSIS — I10 Essential (primary) hypertension: Secondary | ICD-10-CM | POA: Diagnosis not present

## 2016-07-15 DIAGNOSIS — E038 Other specified hypothyroidism: Secondary | ICD-10-CM | POA: Diagnosis not present

## 2016-07-15 DIAGNOSIS — R590 Localized enlarged lymph nodes: Secondary | ICD-10-CM | POA: Diagnosis not present

## 2016-07-31 DIAGNOSIS — Z01818 Encounter for other preprocedural examination: Secondary | ICD-10-CM | POA: Diagnosis not present

## 2016-08-19 DIAGNOSIS — Z0000 Encounter for general adult medical examination without abnormal findings: Secondary | ICD-10-CM | POA: Diagnosis not present

## 2016-08-19 DIAGNOSIS — R69 Illness, unspecified: Secondary | ICD-10-CM | POA: Diagnosis not present

## 2016-08-19 DIAGNOSIS — D509 Iron deficiency anemia, unspecified: Secondary | ICD-10-CM | POA: Diagnosis not present

## 2016-08-19 DIAGNOSIS — Z6830 Body mass index (BMI) 30.0-30.9, adult: Secondary | ICD-10-CM | POA: Diagnosis not present

## 2016-08-19 DIAGNOSIS — I480 Paroxysmal atrial fibrillation: Secondary | ICD-10-CM | POA: Diagnosis not present

## 2016-08-19 DIAGNOSIS — E669 Obesity, unspecified: Secondary | ICD-10-CM | POA: Diagnosis not present

## 2016-08-19 DIAGNOSIS — Z9849 Cataract extraction status, unspecified eye: Secondary | ICD-10-CM | POA: Diagnosis not present

## 2016-08-19 DIAGNOSIS — I209 Angina pectoris, unspecified: Secondary | ICD-10-CM | POA: Diagnosis not present

## 2016-08-19 DIAGNOSIS — J4520 Mild intermittent asthma, uncomplicated: Secondary | ICD-10-CM | POA: Diagnosis not present

## 2016-08-19 DIAGNOSIS — R601 Generalized edema: Secondary | ICD-10-CM | POA: Diagnosis not present

## 2016-08-19 DIAGNOSIS — E119 Type 2 diabetes mellitus without complications: Secondary | ICD-10-CM | POA: Diagnosis not present

## 2016-08-19 DIAGNOSIS — Z794 Long term (current) use of insulin: Secondary | ICD-10-CM | POA: Diagnosis not present

## 2016-08-19 DIAGNOSIS — Z961 Presence of intraocular lens: Secondary | ICD-10-CM | POA: Diagnosis not present

## 2016-08-19 DIAGNOSIS — I10 Essential (primary) hypertension: Secondary | ICD-10-CM | POA: Diagnosis not present

## 2016-08-19 DIAGNOSIS — E039 Hypothyroidism, unspecified: Secondary | ICD-10-CM | POA: Diagnosis not present

## 2016-08-19 DIAGNOSIS — Z955 Presence of coronary angioplasty implant and graft: Secondary | ICD-10-CM | POA: Diagnosis not present

## 2016-08-26 DIAGNOSIS — R52 Pain, unspecified: Secondary | ICD-10-CM | POA: Diagnosis not present

## 2016-08-26 DIAGNOSIS — Z01818 Encounter for other preprocedural examination: Secondary | ICD-10-CM | POA: Diagnosis not present

## 2016-08-26 DIAGNOSIS — Z79899 Other long term (current) drug therapy: Secondary | ICD-10-CM | POA: Diagnosis not present

## 2016-08-26 DIAGNOSIS — M79609 Pain in unspecified limb: Secondary | ICD-10-CM | POA: Diagnosis not present

## 2016-09-04 DIAGNOSIS — E119 Type 2 diabetes mellitus without complications: Secondary | ICD-10-CM | POA: Diagnosis not present

## 2016-09-04 DIAGNOSIS — I10 Essential (primary) hypertension: Secondary | ICD-10-CM | POA: Diagnosis not present

## 2016-09-04 DIAGNOSIS — I481 Persistent atrial fibrillation: Secondary | ICD-10-CM | POA: Diagnosis not present

## 2016-09-04 DIAGNOSIS — H1013 Acute atopic conjunctivitis, bilateral: Secondary | ICD-10-CM | POA: Diagnosis not present

## 2016-09-19 DIAGNOSIS — E119 Type 2 diabetes mellitus without complications: Secondary | ICD-10-CM | POA: Diagnosis not present

## 2016-09-24 DIAGNOSIS — I10 Essential (primary) hypertension: Secondary | ICD-10-CM | POA: Diagnosis not present

## 2016-09-24 DIAGNOSIS — E038 Other specified hypothyroidism: Secondary | ICD-10-CM | POA: Diagnosis not present

## 2016-09-24 DIAGNOSIS — E119 Type 2 diabetes mellitus without complications: Secondary | ICD-10-CM | POA: Diagnosis not present

## 2016-09-24 DIAGNOSIS — I481 Persistent atrial fibrillation: Secondary | ICD-10-CM | POA: Diagnosis not present

## 2016-10-07 DIAGNOSIS — Z96641 Presence of right artificial hip joint: Secondary | ICD-10-CM | POA: Diagnosis not present

## 2016-10-07 DIAGNOSIS — T8451XD Infection and inflammatory reaction due to internal right hip prosthesis, subsequent encounter: Secondary | ICD-10-CM | POA: Diagnosis not present

## 2016-10-07 DIAGNOSIS — Z01818 Encounter for other preprocedural examination: Secondary | ICD-10-CM | POA: Diagnosis not present

## 2016-10-15 DIAGNOSIS — R52 Pain, unspecified: Secondary | ICD-10-CM | POA: Diagnosis not present

## 2016-10-15 DIAGNOSIS — M79609 Pain in unspecified limb: Secondary | ICD-10-CM | POA: Diagnosis not present

## 2016-10-15 DIAGNOSIS — Z79899 Other long term (current) drug therapy: Secondary | ICD-10-CM | POA: Diagnosis not present

## 2016-10-15 DIAGNOSIS — Z01818 Encounter for other preprocedural examination: Secondary | ICD-10-CM | POA: Diagnosis not present

## 2016-10-23 DIAGNOSIS — Z471 Aftercare following joint replacement surgery: Secondary | ICD-10-CM | POA: Diagnosis not present

## 2016-10-23 DIAGNOSIS — E1122 Type 2 diabetes mellitus with diabetic chronic kidney disease: Secondary | ICD-10-CM | POA: Diagnosis not present

## 2016-10-23 DIAGNOSIS — R030 Elevated blood-pressure reading, without diagnosis of hypertension: Secondary | ICD-10-CM | POA: Diagnosis not present

## 2016-10-23 DIAGNOSIS — E8779 Other fluid overload: Secondary | ICD-10-CM | POA: Diagnosis not present

## 2016-10-23 DIAGNOSIS — R001 Bradycardia, unspecified: Secondary | ICD-10-CM | POA: Diagnosis not present

## 2016-10-23 DIAGNOSIS — Y831 Surgical operation with implant of artificial internal device as the cause of abnormal reaction of the patient, or of later complication, without mention of misadventure at the time of the procedure: Secondary | ICD-10-CM | POA: Diagnosis not present

## 2016-10-23 DIAGNOSIS — J45909 Unspecified asthma, uncomplicated: Secondary | ICD-10-CM | POA: Diagnosis not present

## 2016-10-23 DIAGNOSIS — J95821 Acute postprocedural respiratory failure: Secondary | ICD-10-CM | POA: Diagnosis not present

## 2016-10-23 DIAGNOSIS — I10 Essential (primary) hypertension: Secondary | ICD-10-CM | POA: Diagnosis not present

## 2016-10-23 DIAGNOSIS — R918 Other nonspecific abnormal finding of lung field: Secondary | ICD-10-CM | POA: Diagnosis not present

## 2016-10-23 DIAGNOSIS — E038 Other specified hypothyroidism: Secondary | ICD-10-CM | POA: Diagnosis not present

## 2016-10-23 DIAGNOSIS — M6588 Other synovitis and tenosynovitis, other site: Secondary | ICD-10-CM | POA: Diagnosis not present

## 2016-10-23 DIAGNOSIS — R079 Chest pain, unspecified: Secondary | ICD-10-CM | POA: Diagnosis not present

## 2016-10-23 DIAGNOSIS — Z4789 Encounter for other orthopedic aftercare: Secondary | ICD-10-CM | POA: Diagnosis not present

## 2016-10-23 DIAGNOSIS — F329 Major depressive disorder, single episode, unspecified: Secondary | ICD-10-CM | POA: Diagnosis not present

## 2016-10-23 DIAGNOSIS — R279 Unspecified lack of coordination: Secondary | ICD-10-CM | POA: Diagnosis not present

## 2016-10-23 DIAGNOSIS — J90 Pleural effusion, not elsewhere classified: Secondary | ICD-10-CM | POA: Diagnosis not present

## 2016-10-23 DIAGNOSIS — M6028 Foreign body granuloma of soft tissue, not elsewhere classified, other site: Secondary | ICD-10-CM | POA: Diagnosis not present

## 2016-10-23 DIAGNOSIS — I481 Persistent atrial fibrillation: Secondary | ICD-10-CM | POA: Diagnosis not present

## 2016-10-23 DIAGNOSIS — B3781 Candidal esophagitis: Secondary | ICD-10-CM | POA: Diagnosis not present

## 2016-10-23 DIAGNOSIS — I5032 Chronic diastolic (congestive) heart failure: Secondary | ICD-10-CM | POA: Diagnosis not present

## 2016-10-23 DIAGNOSIS — E039 Hypothyroidism, unspecified: Secondary | ICD-10-CM | POA: Diagnosis not present

## 2016-10-23 DIAGNOSIS — Z7901 Long term (current) use of anticoagulants: Secondary | ICD-10-CM | POA: Diagnosis not present

## 2016-10-23 DIAGNOSIS — J189 Pneumonia, unspecified organism: Secondary | ICD-10-CM | POA: Diagnosis not present

## 2016-10-23 DIAGNOSIS — N189 Chronic kidney disease, unspecified: Secondary | ICD-10-CM | POA: Diagnosis not present

## 2016-10-23 DIAGNOSIS — Z8673 Personal history of transient ischemic attack (TIA), and cerebral infarction without residual deficits: Secondary | ICD-10-CM | POA: Diagnosis not present

## 2016-10-23 DIAGNOSIS — J9621 Acute and chronic respiratory failure with hypoxia: Secondary | ICD-10-CM | POA: Diagnosis not present

## 2016-10-23 DIAGNOSIS — I517 Cardiomegaly: Secondary | ICD-10-CM | POA: Diagnosis not present

## 2016-10-23 DIAGNOSIS — I447 Left bundle-branch block, unspecified: Secondary | ICD-10-CM | POA: Diagnosis not present

## 2016-10-23 DIAGNOSIS — E874 Mixed disorder of acid-base balance: Secondary | ICD-10-CM | POA: Diagnosis not present

## 2016-10-23 DIAGNOSIS — Z96649 Presence of unspecified artificial hip joint: Secondary | ICD-10-CM | POA: Diagnosis not present

## 2016-10-23 DIAGNOSIS — R197 Diarrhea, unspecified: Secondary | ICD-10-CM | POA: Diagnosis not present

## 2016-10-23 DIAGNOSIS — I482 Chronic atrial fibrillation: Secondary | ICD-10-CM | POA: Diagnosis not present

## 2016-10-23 DIAGNOSIS — I480 Paroxysmal atrial fibrillation: Secondary | ICD-10-CM | POA: Diagnosis not present

## 2016-10-23 DIAGNOSIS — L03115 Cellulitis of right lower limb: Secondary | ICD-10-CM | POA: Diagnosis not present

## 2016-10-23 DIAGNOSIS — Z955 Presence of coronary angioplasty implant and graft: Secondary | ICD-10-CM | POA: Diagnosis not present

## 2016-10-23 DIAGNOSIS — K219 Gastro-esophageal reflux disease without esophagitis: Secondary | ICD-10-CM | POA: Diagnosis not present

## 2016-10-23 DIAGNOSIS — T8451XD Infection and inflammatory reaction due to internal right hip prosthesis, subsequent encounter: Secondary | ICD-10-CM | POA: Diagnosis not present

## 2016-10-23 DIAGNOSIS — I130 Hypertensive heart and chronic kidney disease with heart failure and stage 1 through stage 4 chronic kidney disease, or unspecified chronic kidney disease: Secondary | ICD-10-CM | POA: Diagnosis not present

## 2016-10-23 DIAGNOSIS — I5031 Acute diastolic (congestive) heart failure: Secondary | ICD-10-CM | POA: Diagnosis not present

## 2016-10-23 DIAGNOSIS — N179 Acute kidney failure, unspecified: Secondary | ICD-10-CM | POA: Diagnosis not present

## 2016-10-23 DIAGNOSIS — E669 Obesity, unspecified: Secondary | ICD-10-CM | POA: Diagnosis not present

## 2016-10-23 DIAGNOSIS — M25551 Pain in right hip: Secondary | ICD-10-CM | POA: Diagnosis not present

## 2016-10-23 DIAGNOSIS — R69 Illness, unspecified: Secondary | ICD-10-CM | POA: Diagnosis not present

## 2016-10-23 DIAGNOSIS — Z7401 Bed confinement status: Secondary | ICD-10-CM | POA: Diagnosis not present

## 2016-10-23 DIAGNOSIS — I129 Hypertensive chronic kidney disease with stage 1 through stage 4 chronic kidney disease, or unspecified chronic kidney disease: Secondary | ICD-10-CM | POA: Diagnosis not present

## 2016-10-23 DIAGNOSIS — I5033 Acute on chronic diastolic (congestive) heart failure: Secondary | ICD-10-CM | POA: Diagnosis not present

## 2016-10-23 DIAGNOSIS — J9589 Other postprocedural complications and disorders of respiratory system, not elsewhere classified: Secondary | ICD-10-CM | POA: Diagnosis not present

## 2016-10-23 DIAGNOSIS — E119 Type 2 diabetes mellitus without complications: Secondary | ICD-10-CM | POA: Diagnosis not present

## 2016-10-23 DIAGNOSIS — D649 Anemia, unspecified: Secondary | ICD-10-CM | POA: Diagnosis not present

## 2016-10-23 DIAGNOSIS — R1310 Dysphagia, unspecified: Secondary | ICD-10-CM | POA: Diagnosis not present

## 2016-10-23 DIAGNOSIS — R9431 Abnormal electrocardiogram [ECG] [EKG]: Secondary | ICD-10-CM | POA: Diagnosis not present

## 2016-10-23 DIAGNOSIS — N183 Chronic kidney disease, stage 3 (moderate): Secondary | ICD-10-CM | POA: Diagnosis not present

## 2016-10-23 DIAGNOSIS — I2510 Atherosclerotic heart disease of native coronary artery without angina pectoris: Secondary | ICD-10-CM | POA: Diagnosis not present

## 2016-10-23 DIAGNOSIS — I495 Sick sinus syndrome: Secondary | ICD-10-CM | POA: Diagnosis not present

## 2016-10-23 DIAGNOSIS — Z96641 Presence of right artificial hip joint: Secondary | ICD-10-CM | POA: Diagnosis not present

## 2016-10-25 DIAGNOSIS — I10 Essential (primary) hypertension: Secondary | ICD-10-CM | POA: Diagnosis not present

## 2016-10-25 DIAGNOSIS — I481 Persistent atrial fibrillation: Secondary | ICD-10-CM | POA: Diagnosis not present

## 2016-10-25 DIAGNOSIS — E038 Other specified hypothyroidism: Secondary | ICD-10-CM | POA: Diagnosis not present

## 2016-10-25 DIAGNOSIS — E119 Type 2 diabetes mellitus without complications: Secondary | ICD-10-CM | POA: Diagnosis not present

## 2016-10-29 DIAGNOSIS — J189 Pneumonia, unspecified organism: Secondary | ICD-10-CM

## 2016-10-29 DIAGNOSIS — J90 Pleural effusion, not elsewhere classified: Secondary | ICD-10-CM

## 2016-11-04 DIAGNOSIS — B3781 Candidal esophagitis: Secondary | ICD-10-CM | POA: Diagnosis not present

## 2016-11-06 DIAGNOSIS — Z96641 Presence of right artificial hip joint: Secondary | ICD-10-CM | POA: Diagnosis not present

## 2016-11-06 DIAGNOSIS — R69 Illness, unspecified: Secondary | ICD-10-CM | POA: Diagnosis not present

## 2016-11-06 DIAGNOSIS — D649 Anemia, unspecified: Secondary | ICD-10-CM | POA: Diagnosis not present

## 2016-11-06 DIAGNOSIS — Z8673 Personal history of transient ischemic attack (TIA), and cerebral infarction without residual deficits: Secondary | ICD-10-CM | POA: Diagnosis not present

## 2016-11-06 DIAGNOSIS — I160 Hypertensive urgency: Secondary | ICD-10-CM | POA: Diagnosis not present

## 2016-11-06 DIAGNOSIS — R001 Bradycardia, unspecified: Secondary | ICD-10-CM | POA: Diagnosis not present

## 2016-11-06 DIAGNOSIS — K219 Gastro-esophageal reflux disease without esophagitis: Secondary | ICD-10-CM | POA: Diagnosis not present

## 2016-11-06 DIAGNOSIS — Z658 Other specified problems related to psychosocial circumstances: Secondary | ICD-10-CM | POA: Diagnosis not present

## 2016-11-06 DIAGNOSIS — Z96649 Presence of unspecified artificial hip joint: Secondary | ICD-10-CM | POA: Diagnosis not present

## 2016-11-06 DIAGNOSIS — E119 Type 2 diabetes mellitus without complications: Secondary | ICD-10-CM | POA: Diagnosis not present

## 2016-11-06 DIAGNOSIS — J189 Pneumonia, unspecified organism: Secondary | ICD-10-CM | POA: Diagnosis not present

## 2016-11-06 DIAGNOSIS — R0602 Shortness of breath: Secondary | ICD-10-CM | POA: Diagnosis not present

## 2016-11-06 DIAGNOSIS — M1712 Unilateral primary osteoarthritis, left knee: Secondary | ICD-10-CM | POA: Diagnosis not present

## 2016-11-06 DIAGNOSIS — J90 Pleural effusion, not elsewhere classified: Secondary | ICD-10-CM | POA: Diagnosis not present

## 2016-11-06 DIAGNOSIS — I4891 Unspecified atrial fibrillation: Secondary | ICD-10-CM | POA: Diagnosis not present

## 2016-11-06 DIAGNOSIS — N183 Chronic kidney disease, stage 3 (moderate): Secondary | ICD-10-CM | POA: Diagnosis not present

## 2016-11-06 DIAGNOSIS — I481 Persistent atrial fibrillation: Secondary | ICD-10-CM | POA: Diagnosis not present

## 2016-11-06 DIAGNOSIS — E559 Vitamin D deficiency, unspecified: Secondary | ICD-10-CM | POA: Diagnosis not present

## 2016-11-06 DIAGNOSIS — M25551 Pain in right hip: Secondary | ICD-10-CM | POA: Diagnosis not present

## 2016-11-06 DIAGNOSIS — J9601 Acute respiratory failure with hypoxia: Secondary | ICD-10-CM | POA: Diagnosis not present

## 2016-11-06 DIAGNOSIS — E039 Hypothyroidism, unspecified: Secondary | ICD-10-CM | POA: Diagnosis not present

## 2016-11-06 DIAGNOSIS — E1122 Type 2 diabetes mellitus with diabetic chronic kidney disease: Secondary | ICD-10-CM | POA: Diagnosis not present

## 2016-11-06 DIAGNOSIS — R279 Unspecified lack of coordination: Secondary | ICD-10-CM | POA: Diagnosis not present

## 2016-11-06 DIAGNOSIS — L03115 Cellulitis of right lower limb: Secondary | ICD-10-CM | POA: Diagnosis not present

## 2016-11-06 DIAGNOSIS — S72141D Displaced intertrochanteric fracture of right femur, subsequent encounter for closed fracture with routine healing: Secondary | ICD-10-CM | POA: Diagnosis not present

## 2016-11-06 DIAGNOSIS — I509 Heart failure, unspecified: Secondary | ICD-10-CM | POA: Diagnosis not present

## 2016-11-06 DIAGNOSIS — A419 Sepsis, unspecified organism: Secondary | ICD-10-CM | POA: Diagnosis not present

## 2016-11-06 DIAGNOSIS — I2510 Atherosclerotic heart disease of native coronary artery without angina pectoris: Secondary | ICD-10-CM | POA: Diagnosis not present

## 2016-11-06 DIAGNOSIS — S72001A Fracture of unspecified part of neck of right femur, initial encounter for closed fracture: Secondary | ICD-10-CM | POA: Diagnosis not present

## 2016-11-06 DIAGNOSIS — Z4789 Encounter for other orthopedic aftercare: Secondary | ICD-10-CM | POA: Diagnosis not present

## 2016-11-06 DIAGNOSIS — Z7401 Bed confinement status: Secondary | ICD-10-CM | POA: Diagnosis not present

## 2016-11-06 DIAGNOSIS — I10 Essential (primary) hypertension: Secondary | ICD-10-CM | POA: Diagnosis not present

## 2016-11-06 DIAGNOSIS — R600 Localized edema: Secondary | ICD-10-CM | POA: Diagnosis not present

## 2016-11-06 DIAGNOSIS — L0390 Cellulitis, unspecified: Secondary | ICD-10-CM | POA: Diagnosis not present

## 2016-11-06 DIAGNOSIS — I129 Hypertensive chronic kidney disease with stage 1 through stage 4 chronic kidney disease, or unspecified chronic kidney disease: Secondary | ICD-10-CM | POA: Diagnosis not present

## 2016-11-08 DIAGNOSIS — E1122 Type 2 diabetes mellitus with diabetic chronic kidney disease: Secondary | ICD-10-CM | POA: Diagnosis not present

## 2016-11-08 DIAGNOSIS — S72141D Displaced intertrochanteric fracture of right femur, subsequent encounter for closed fracture with routine healing: Secondary | ICD-10-CM | POA: Diagnosis not present

## 2016-11-08 DIAGNOSIS — D649 Anemia, unspecified: Secondary | ICD-10-CM | POA: Diagnosis not present

## 2016-11-08 DIAGNOSIS — N183 Chronic kidney disease, stage 3 (moderate): Secondary | ICD-10-CM | POA: Diagnosis not present

## 2016-11-14 DIAGNOSIS — Z96641 Presence of right artificial hip joint: Secondary | ICD-10-CM | POA: Diagnosis not present

## 2016-11-14 DIAGNOSIS — S72001A Fracture of unspecified part of neck of right femur, initial encounter for closed fracture: Secondary | ICD-10-CM | POA: Diagnosis not present

## 2016-11-14 DIAGNOSIS — M1712 Unilateral primary osteoarthritis, left knee: Secondary | ICD-10-CM | POA: Diagnosis not present

## 2016-11-15 DIAGNOSIS — D649 Anemia, unspecified: Secondary | ICD-10-CM | POA: Diagnosis not present

## 2016-11-15 DIAGNOSIS — R600 Localized edema: Secondary | ICD-10-CM | POA: Diagnosis not present

## 2016-11-15 DIAGNOSIS — E039 Hypothyroidism, unspecified: Secondary | ICD-10-CM | POA: Diagnosis not present

## 2016-11-15 DIAGNOSIS — E1122 Type 2 diabetes mellitus with diabetic chronic kidney disease: Secondary | ICD-10-CM | POA: Diagnosis not present

## 2016-11-27 DIAGNOSIS — I4891 Unspecified atrial fibrillation: Secondary | ICD-10-CM | POA: Diagnosis not present

## 2016-11-27 DIAGNOSIS — R600 Localized edema: Secondary | ICD-10-CM | POA: Diagnosis not present

## 2016-11-27 DIAGNOSIS — L0390 Cellulitis, unspecified: Secondary | ICD-10-CM | POA: Diagnosis not present

## 2016-11-27 DIAGNOSIS — E1122 Type 2 diabetes mellitus with diabetic chronic kidney disease: Secondary | ICD-10-CM | POA: Diagnosis not present

## 2016-12-01 DIAGNOSIS — I10 Essential (primary) hypertension: Secondary | ICD-10-CM | POA: Diagnosis not present

## 2016-12-01 DIAGNOSIS — A419 Sepsis, unspecified organism: Secondary | ICD-10-CM | POA: Diagnosis not present

## 2016-12-01 DIAGNOSIS — I480 Paroxysmal atrial fibrillation: Secondary | ICD-10-CM | POA: Diagnosis not present

## 2016-12-01 DIAGNOSIS — E039 Hypothyroidism, unspecified: Secondary | ICD-10-CM | POA: Diagnosis not present

## 2016-12-01 DIAGNOSIS — I130 Hypertensive heart and chronic kidney disease with heart failure and stage 1 through stage 4 chronic kidney disease, or unspecified chronic kidney disease: Secondary | ICD-10-CM | POA: Diagnosis not present

## 2016-12-01 DIAGNOSIS — M542 Cervicalgia: Secondary | ICD-10-CM | POA: Diagnosis not present

## 2016-12-01 DIAGNOSIS — Z4789 Encounter for other orthopedic aftercare: Secondary | ICD-10-CM | POA: Diagnosis not present

## 2016-12-01 DIAGNOSIS — N390 Urinary tract infection, site not specified: Secondary | ICD-10-CM | POA: Diagnosis not present

## 2016-12-01 DIAGNOSIS — R0602 Shortness of breath: Secondary | ICD-10-CM | POA: Diagnosis not present

## 2016-12-01 DIAGNOSIS — N183 Chronic kidney disease, stage 3 (moderate): Secondary | ICD-10-CM | POA: Diagnosis not present

## 2016-12-01 DIAGNOSIS — R001 Bradycardia, unspecified: Secondary | ICD-10-CM | POA: Diagnosis not present

## 2016-12-01 DIAGNOSIS — J9601 Acute respiratory failure with hypoxia: Secondary | ICD-10-CM | POA: Diagnosis not present

## 2016-12-01 DIAGNOSIS — R69 Illness, unspecified: Secondary | ICD-10-CM | POA: Diagnosis not present

## 2016-12-01 DIAGNOSIS — R279 Unspecified lack of coordination: Secondary | ICD-10-CM | POA: Diagnosis not present

## 2016-12-01 DIAGNOSIS — Z9981 Dependence on supplemental oxygen: Secondary | ICD-10-CM | POA: Diagnosis not present

## 2016-12-01 DIAGNOSIS — I509 Heart failure, unspecified: Secondary | ICD-10-CM | POA: Diagnosis not present

## 2016-12-01 DIAGNOSIS — Z7401 Bed confinement status: Secondary | ICD-10-CM | POA: Diagnosis not present

## 2016-12-01 DIAGNOSIS — I129 Hypertensive chronic kidney disease with stage 1 through stage 4 chronic kidney disease, or unspecified chronic kidney disease: Secondary | ICD-10-CM | POA: Diagnosis not present

## 2016-12-01 DIAGNOSIS — I160 Hypertensive urgency: Secondary | ICD-10-CM | POA: Diagnosis not present

## 2016-12-01 DIAGNOSIS — B9689 Other specified bacterial agents as the cause of diseases classified elsewhere: Secondary | ICD-10-CM | POA: Diagnosis not present

## 2016-12-01 DIAGNOSIS — E1122 Type 2 diabetes mellitus with diabetic chronic kidney disease: Secondary | ICD-10-CM | POA: Diagnosis not present

## 2016-12-01 DIAGNOSIS — Z23 Encounter for immunization: Secondary | ICD-10-CM | POA: Diagnosis not present

## 2016-12-01 DIAGNOSIS — D62 Acute posthemorrhagic anemia: Secondary | ICD-10-CM | POA: Diagnosis not present

## 2016-12-01 DIAGNOSIS — E669 Obesity, unspecified: Secondary | ICD-10-CM | POA: Diagnosis not present

## 2016-12-01 DIAGNOSIS — J449 Chronic obstructive pulmonary disease, unspecified: Secondary | ICD-10-CM | POA: Diagnosis not present

## 2016-12-01 DIAGNOSIS — J9621 Acute and chronic respiratory failure with hypoxia: Secondary | ICD-10-CM | POA: Diagnosis not present

## 2016-12-01 DIAGNOSIS — Z96641 Presence of right artificial hip joint: Secondary | ICD-10-CM | POA: Diagnosis not present

## 2016-12-01 DIAGNOSIS — Z8673 Personal history of transient ischemic attack (TIA), and cerebral infarction without residual deficits: Secondary | ICD-10-CM | POA: Diagnosis not present

## 2016-12-01 DIAGNOSIS — N3000 Acute cystitis without hematuria: Secondary | ICD-10-CM | POA: Diagnosis not present

## 2016-12-01 DIAGNOSIS — I5033 Acute on chronic diastolic (congestive) heart failure: Secondary | ICD-10-CM | POA: Diagnosis not present

## 2016-12-01 DIAGNOSIS — J189 Pneumonia, unspecified organism: Secondary | ICD-10-CM | POA: Diagnosis not present

## 2016-12-01 DIAGNOSIS — I2510 Atherosclerotic heart disease of native coronary artery without angina pectoris: Secondary | ICD-10-CM | POA: Diagnosis not present

## 2016-12-01 DIAGNOSIS — J90 Pleural effusion, not elsewhere classified: Secondary | ICD-10-CM | POA: Diagnosis not present

## 2016-12-05 DIAGNOSIS — N3000 Acute cystitis without hematuria: Secondary | ICD-10-CM | POA: Diagnosis not present

## 2016-12-05 DIAGNOSIS — I5030 Unspecified diastolic (congestive) heart failure: Secondary | ICD-10-CM | POA: Diagnosis not present

## 2016-12-05 DIAGNOSIS — D500 Iron deficiency anemia secondary to blood loss (chronic): Secondary | ICD-10-CM | POA: Diagnosis not present

## 2016-12-05 DIAGNOSIS — J181 Lobar pneumonia, unspecified organism: Secondary | ICD-10-CM | POA: Diagnosis not present

## 2016-12-05 DIAGNOSIS — Z7401 Bed confinement status: Secondary | ICD-10-CM | POA: Diagnosis not present

## 2016-12-05 DIAGNOSIS — R001 Bradycardia, unspecified: Secondary | ICD-10-CM | POA: Diagnosis not present

## 2016-12-05 DIAGNOSIS — I4891 Unspecified atrial fibrillation: Secondary | ICD-10-CM | POA: Diagnosis not present

## 2016-12-05 DIAGNOSIS — Z96641 Presence of right artificial hip joint: Secondary | ICD-10-CM | POA: Diagnosis not present

## 2016-12-05 DIAGNOSIS — I509 Heart failure, unspecified: Secondary | ICD-10-CM | POA: Diagnosis not present

## 2016-12-05 DIAGNOSIS — J90 Pleural effusion, not elsewhere classified: Secondary | ICD-10-CM | POA: Diagnosis not present

## 2016-12-05 DIAGNOSIS — R69 Illness, unspecified: Secondary | ICD-10-CM | POA: Diagnosis not present

## 2016-12-05 DIAGNOSIS — I129 Hypertensive chronic kidney disease with stage 1 through stage 4 chronic kidney disease, or unspecified chronic kidney disease: Secondary | ICD-10-CM | POA: Diagnosis not present

## 2016-12-05 DIAGNOSIS — J9621 Acute and chronic respiratory failure with hypoxia: Secondary | ICD-10-CM | POA: Diagnosis not present

## 2016-12-05 DIAGNOSIS — E669 Obesity, unspecified: Secondary | ICD-10-CM | POA: Diagnosis not present

## 2016-12-05 DIAGNOSIS — N390 Urinary tract infection, site not specified: Secondary | ICD-10-CM | POA: Diagnosis not present

## 2016-12-05 DIAGNOSIS — J440 Chronic obstructive pulmonary disease with acute lower respiratory infection: Secondary | ICD-10-CM | POA: Diagnosis not present

## 2016-12-05 DIAGNOSIS — E039 Hypothyroidism, unspecified: Secondary | ICD-10-CM | POA: Diagnosis not present

## 2016-12-05 DIAGNOSIS — I480 Paroxysmal atrial fibrillation: Secondary | ICD-10-CM | POA: Diagnosis not present

## 2016-12-05 DIAGNOSIS — F329 Major depressive disorder, single episode, unspecified: Secondary | ICD-10-CM | POA: Diagnosis not present

## 2016-12-05 DIAGNOSIS — J9600 Acute respiratory failure, unspecified whether with hypoxia or hypercapnia: Secondary | ICD-10-CM | POA: Diagnosis not present

## 2016-12-05 DIAGNOSIS — J9610 Chronic respiratory failure, unspecified whether with hypoxia or hypercapnia: Secondary | ICD-10-CM | POA: Diagnosis not present

## 2016-12-05 DIAGNOSIS — I5032 Chronic diastolic (congestive) heart failure: Secondary | ICD-10-CM | POA: Diagnosis not present

## 2016-12-05 DIAGNOSIS — B9689 Other specified bacterial agents as the cause of diseases classified elsewhere: Secondary | ICD-10-CM | POA: Diagnosis not present

## 2016-12-05 DIAGNOSIS — I10 Essential (primary) hypertension: Secondary | ICD-10-CM | POA: Diagnosis not present

## 2016-12-05 DIAGNOSIS — E1122 Type 2 diabetes mellitus with diabetic chronic kidney disease: Secondary | ICD-10-CM | POA: Diagnosis not present

## 2016-12-05 DIAGNOSIS — K922 Gastrointestinal hemorrhage, unspecified: Secondary | ICD-10-CM | POA: Diagnosis not present

## 2016-12-05 DIAGNOSIS — I130 Hypertensive heart and chronic kidney disease with heart failure and stage 1 through stage 4 chronic kidney disease, or unspecified chronic kidney disease: Secondary | ICD-10-CM | POA: Diagnosis not present

## 2016-12-05 DIAGNOSIS — Z8673 Personal history of transient ischemic attack (TIA), and cerebral infarction without residual deficits: Secondary | ICD-10-CM | POA: Diagnosis not present

## 2016-12-05 DIAGNOSIS — R279 Unspecified lack of coordination: Secondary | ICD-10-CM | POA: Diagnosis not present

## 2016-12-05 DIAGNOSIS — J189 Pneumonia, unspecified organism: Secondary | ICD-10-CM | POA: Diagnosis not present

## 2016-12-05 DIAGNOSIS — J449 Chronic obstructive pulmonary disease, unspecified: Secondary | ICD-10-CM | POA: Diagnosis not present

## 2016-12-05 DIAGNOSIS — Z9981 Dependence on supplemental oxygen: Secondary | ICD-10-CM | POA: Diagnosis not present

## 2016-12-05 DIAGNOSIS — D62 Acute posthemorrhagic anemia: Secondary | ICD-10-CM | POA: Diagnosis not present

## 2016-12-05 DIAGNOSIS — Z23 Encounter for immunization: Secondary | ICD-10-CM | POA: Diagnosis not present

## 2016-12-05 DIAGNOSIS — I110 Hypertensive heart disease with heart failure: Secondary | ICD-10-CM | POA: Diagnosis not present

## 2016-12-05 DIAGNOSIS — I2510 Atherosclerotic heart disease of native coronary artery without angina pectoris: Secondary | ICD-10-CM | POA: Diagnosis not present

## 2016-12-05 DIAGNOSIS — S72141D Displaced intertrochanteric fracture of right femur, subsequent encounter for closed fracture with routine healing: Secondary | ICD-10-CM | POA: Diagnosis not present

## 2016-12-05 DIAGNOSIS — Z471 Aftercare following joint replacement surgery: Secondary | ICD-10-CM | POA: Diagnosis not present

## 2016-12-05 DIAGNOSIS — S72141A Displaced intertrochanteric fracture of right femur, initial encounter for closed fracture: Secondary | ICD-10-CM | POA: Diagnosis not present

## 2016-12-05 DIAGNOSIS — E871 Hypo-osmolality and hyponatremia: Secondary | ICD-10-CM | POA: Diagnosis not present

## 2016-12-05 DIAGNOSIS — J984 Other disorders of lung: Secondary | ICD-10-CM | POA: Diagnosis not present

## 2016-12-05 DIAGNOSIS — N183 Chronic kidney disease, stage 3 (moderate): Secondary | ICD-10-CM | POA: Diagnosis not present

## 2016-12-05 DIAGNOSIS — E1142 Type 2 diabetes mellitus with diabetic polyneuropathy: Secondary | ICD-10-CM | POA: Diagnosis not present

## 2016-12-05 DIAGNOSIS — R079 Chest pain, unspecified: Secondary | ICD-10-CM | POA: Diagnosis not present

## 2016-12-05 DIAGNOSIS — E870 Hyperosmolality and hypernatremia: Secondary | ICD-10-CM | POA: Diagnosis not present

## 2016-12-05 DIAGNOSIS — I5033 Acute on chronic diastolic (congestive) heart failure: Secondary | ICD-10-CM | POA: Diagnosis not present

## 2016-12-05 DIAGNOSIS — K219 Gastro-esophageal reflux disease without esophagitis: Secondary | ICD-10-CM | POA: Diagnosis not present

## 2016-12-05 DIAGNOSIS — D649 Anemia, unspecified: Secondary | ICD-10-CM | POA: Diagnosis not present

## 2016-12-05 DIAGNOSIS — R0602 Shortness of breath: Secondary | ICD-10-CM | POA: Diagnosis not present

## 2016-12-05 DIAGNOSIS — Z4789 Encounter for other orthopedic aftercare: Secondary | ICD-10-CM | POA: Diagnosis not present

## 2016-12-06 DIAGNOSIS — I4891 Unspecified atrial fibrillation: Secondary | ICD-10-CM | POA: Diagnosis not present

## 2016-12-06 DIAGNOSIS — S72141A Displaced intertrochanteric fracture of right femur, initial encounter for closed fracture: Secondary | ICD-10-CM | POA: Diagnosis not present

## 2016-12-06 DIAGNOSIS — D500 Iron deficiency anemia secondary to blood loss (chronic): Secondary | ICD-10-CM | POA: Diagnosis not present

## 2016-12-06 DIAGNOSIS — N390 Urinary tract infection, site not specified: Secondary | ICD-10-CM | POA: Diagnosis not present

## 2016-12-11 DIAGNOSIS — E1122 Type 2 diabetes mellitus with diabetic chronic kidney disease: Secondary | ICD-10-CM | POA: Diagnosis not present

## 2016-12-11 DIAGNOSIS — S72141D Displaced intertrochanteric fracture of right femur, subsequent encounter for closed fracture with routine healing: Secondary | ICD-10-CM | POA: Diagnosis not present

## 2016-12-11 DIAGNOSIS — D500 Iron deficiency anemia secondary to blood loss (chronic): Secondary | ICD-10-CM | POA: Diagnosis not present

## 2016-12-11 DIAGNOSIS — I4891 Unspecified atrial fibrillation: Secondary | ICD-10-CM | POA: Diagnosis not present

## 2016-12-18 DIAGNOSIS — K922 Gastrointestinal hemorrhage, unspecified: Secondary | ICD-10-CM | POA: Diagnosis not present

## 2016-12-18 DIAGNOSIS — I130 Hypertensive heart and chronic kidney disease with heart failure and stage 1 through stage 4 chronic kidney disease, or unspecified chronic kidney disease: Secondary | ICD-10-CM | POA: Diagnosis not present

## 2016-12-18 DIAGNOSIS — I2583 Coronary atherosclerosis due to lipid rich plaque: Secondary | ICD-10-CM | POA: Diagnosis not present

## 2016-12-18 DIAGNOSIS — E1142 Type 2 diabetes mellitus with diabetic polyneuropathy: Secondary | ICD-10-CM | POA: Diagnosis not present

## 2016-12-18 DIAGNOSIS — I509 Heart failure, unspecified: Secondary | ICD-10-CM | POA: Diagnosis not present

## 2016-12-18 DIAGNOSIS — J181 Lobar pneumonia, unspecified organism: Secondary | ICD-10-CM | POA: Diagnosis not present

## 2016-12-18 DIAGNOSIS — E1122 Type 2 diabetes mellitus with diabetic chronic kidney disease: Secondary | ICD-10-CM | POA: Diagnosis not present

## 2016-12-18 DIAGNOSIS — E871 Hypo-osmolality and hyponatremia: Secondary | ICD-10-CM | POA: Diagnosis not present

## 2016-12-18 DIAGNOSIS — J189 Pneumonia, unspecified organism: Secondary | ICD-10-CM | POA: Diagnosis not present

## 2016-12-18 DIAGNOSIS — F329 Major depressive disorder, single episode, unspecified: Secondary | ICD-10-CM | POA: Diagnosis not present

## 2016-12-18 DIAGNOSIS — D649 Anemia, unspecified: Secondary | ICD-10-CM | POA: Diagnosis not present

## 2016-12-18 DIAGNOSIS — D631 Anemia in chronic kidney disease: Secondary | ICD-10-CM | POA: Diagnosis not present

## 2016-12-18 DIAGNOSIS — J440 Chronic obstructive pulmonary disease with acute lower respiratory infection: Secondary | ICD-10-CM | POA: Diagnosis not present

## 2016-12-18 DIAGNOSIS — E870 Hyperosmolality and hypernatremia: Secondary | ICD-10-CM | POA: Diagnosis not present

## 2016-12-18 DIAGNOSIS — Z96641 Presence of right artificial hip joint: Secondary | ICD-10-CM | POA: Diagnosis not present

## 2016-12-18 DIAGNOSIS — R69 Illness, unspecified: Secondary | ICD-10-CM | POA: Diagnosis not present

## 2016-12-18 DIAGNOSIS — Z471 Aftercare following joint replacement surgery: Secondary | ICD-10-CM | POA: Diagnosis not present

## 2016-12-18 DIAGNOSIS — K219 Gastro-esophageal reflux disease without esophagitis: Secondary | ICD-10-CM | POA: Diagnosis not present

## 2016-12-18 DIAGNOSIS — E669 Obesity, unspecified: Secondary | ICD-10-CM | POA: Diagnosis not present

## 2016-12-18 DIAGNOSIS — Z794 Long term (current) use of insulin: Secondary | ICD-10-CM | POA: Diagnosis not present

## 2016-12-18 DIAGNOSIS — N183 Chronic kidney disease, stage 3 (moderate): Secondary | ICD-10-CM | POA: Diagnosis not present

## 2016-12-18 DIAGNOSIS — J9610 Chronic respiratory failure, unspecified whether with hypoxia or hypercapnia: Secondary | ICD-10-CM | POA: Diagnosis not present

## 2016-12-18 DIAGNOSIS — E1100 Type 2 diabetes mellitus with hyperosmolarity without nonketotic hyperglycemic-hyperosmolar coma (NKHHC): Secondary | ICD-10-CM | POA: Diagnosis not present

## 2016-12-18 DIAGNOSIS — J9600 Acute respiratory failure, unspecified whether with hypoxia or hypercapnia: Secondary | ICD-10-CM | POA: Diagnosis not present

## 2016-12-18 DIAGNOSIS — I5032 Chronic diastolic (congestive) heart failure: Secondary | ICD-10-CM | POA: Diagnosis not present

## 2016-12-18 DIAGNOSIS — I2510 Atherosclerotic heart disease of native coronary artery without angina pectoris: Secondary | ICD-10-CM | POA: Diagnosis not present

## 2016-12-18 DIAGNOSIS — E039 Hypothyroidism, unspecified: Secondary | ICD-10-CM | POA: Diagnosis not present

## 2016-12-18 DIAGNOSIS — I5030 Unspecified diastolic (congestive) heart failure: Secondary | ICD-10-CM | POA: Diagnosis not present

## 2016-12-18 DIAGNOSIS — I10 Essential (primary) hypertension: Secondary | ICD-10-CM | POA: Diagnosis not present

## 2016-12-18 DIAGNOSIS — I4891 Unspecified atrial fibrillation: Secondary | ICD-10-CM | POA: Diagnosis not present

## 2016-12-18 DIAGNOSIS — I110 Hypertensive heart disease with heart failure: Secondary | ICD-10-CM | POA: Diagnosis not present

## 2016-12-18 DIAGNOSIS — R079 Chest pain, unspecified: Secondary | ICD-10-CM | POA: Diagnosis not present

## 2016-12-18 DIAGNOSIS — I480 Paroxysmal atrial fibrillation: Secondary | ICD-10-CM | POA: Diagnosis not present

## 2016-12-23 DIAGNOSIS — R079 Chest pain, unspecified: Secondary | ICD-10-CM | POA: Diagnosis not present

## 2016-12-23 DIAGNOSIS — Z794 Long term (current) use of insulin: Secondary | ICD-10-CM | POA: Diagnosis not present

## 2016-12-23 DIAGNOSIS — I4891 Unspecified atrial fibrillation: Secondary | ICD-10-CM | POA: Diagnosis not present

## 2016-12-23 DIAGNOSIS — J181 Lobar pneumonia, unspecified organism: Secondary | ICD-10-CM | POA: Diagnosis not present

## 2016-12-23 DIAGNOSIS — J189 Pneumonia, unspecified organism: Secondary | ICD-10-CM | POA: Diagnosis not present

## 2016-12-23 DIAGNOSIS — D631 Anemia in chronic kidney disease: Secondary | ICD-10-CM | POA: Diagnosis not present

## 2016-12-23 DIAGNOSIS — I2583 Coronary atherosclerosis due to lipid rich plaque: Secondary | ICD-10-CM | POA: Diagnosis not present

## 2016-12-23 DIAGNOSIS — Z955 Presence of coronary angioplasty implant and graft: Secondary | ICD-10-CM | POA: Diagnosis not present

## 2016-12-23 DIAGNOSIS — I2510 Atherosclerotic heart disease of native coronary artery without angina pectoris: Secondary | ICD-10-CM | POA: Diagnosis not present

## 2016-12-23 DIAGNOSIS — I5033 Acute on chronic diastolic (congestive) heart failure: Secondary | ICD-10-CM | POA: Diagnosis not present

## 2016-12-23 DIAGNOSIS — I110 Hypertensive heart disease with heart failure: Secondary | ICD-10-CM | POA: Diagnosis not present

## 2016-12-23 DIAGNOSIS — I480 Paroxysmal atrial fibrillation: Secondary | ICD-10-CM | POA: Diagnosis not present

## 2016-12-23 DIAGNOSIS — N183 Chronic kidney disease, stage 3 (moderate): Secondary | ICD-10-CM | POA: Diagnosis not present

## 2016-12-23 DIAGNOSIS — K219 Gastro-esophageal reflux disease without esophagitis: Secondary | ICD-10-CM | POA: Diagnosis not present

## 2016-12-23 DIAGNOSIS — I10 Essential (primary) hypertension: Secondary | ICD-10-CM | POA: Diagnosis not present

## 2016-12-23 DIAGNOSIS — J45909 Unspecified asthma, uncomplicated: Secondary | ICD-10-CM | POA: Diagnosis not present

## 2016-12-23 DIAGNOSIS — E1100 Type 2 diabetes mellitus with hyperosmolarity without nonketotic hyperglycemic-hyperosmolar coma (NKHHC): Secondary | ICD-10-CM | POA: Diagnosis not present

## 2016-12-23 DIAGNOSIS — J9621 Acute and chronic respiratory failure with hypoxia: Secondary | ICD-10-CM | POA: Diagnosis not present

## 2016-12-23 DIAGNOSIS — I130 Hypertensive heart and chronic kidney disease with heart failure and stage 1 through stage 4 chronic kidney disease, or unspecified chronic kidney disease: Secondary | ICD-10-CM | POA: Diagnosis not present

## 2016-12-23 DIAGNOSIS — E039 Hypothyroidism, unspecified: Secondary | ICD-10-CM | POA: Diagnosis not present

## 2016-12-23 DIAGNOSIS — R0602 Shortness of breath: Secondary | ICD-10-CM | POA: Diagnosis not present

## 2016-12-23 DIAGNOSIS — R69 Illness, unspecified: Secondary | ICD-10-CM | POA: Diagnosis not present

## 2016-12-23 DIAGNOSIS — I509 Heart failure, unspecified: Secondary | ICD-10-CM | POA: Diagnosis not present

## 2016-12-23 DIAGNOSIS — E1142 Type 2 diabetes mellitus with diabetic polyneuropathy: Secondary | ICD-10-CM | POA: Diagnosis not present

## 2016-12-23 DIAGNOSIS — I5030 Unspecified diastolic (congestive) heart failure: Secondary | ICD-10-CM | POA: Diagnosis not present

## 2016-12-23 DIAGNOSIS — E1122 Type 2 diabetes mellitus with diabetic chronic kidney disease: Secondary | ICD-10-CM | POA: Diagnosis not present

## 2016-12-23 DIAGNOSIS — R069 Unspecified abnormalities of breathing: Secondary | ICD-10-CM | POA: Diagnosis not present

## 2016-12-24 DIAGNOSIS — I5033 Acute on chronic diastolic (congestive) heart failure: Secondary | ICD-10-CM | POA: Diagnosis not present

## 2016-12-24 DIAGNOSIS — J80 Acute respiratory distress syndrome: Secondary | ICD-10-CM | POA: Diagnosis not present

## 2016-12-24 DIAGNOSIS — I2510 Atherosclerotic heart disease of native coronary artery without angina pectoris: Secondary | ICD-10-CM | POA: Diagnosis not present

## 2016-12-24 DIAGNOSIS — J449 Chronic obstructive pulmonary disease, unspecified: Secondary | ICD-10-CM | POA: Diagnosis not present

## 2016-12-24 DIAGNOSIS — R069 Unspecified abnormalities of breathing: Secondary | ICD-10-CM | POA: Diagnosis not present

## 2016-12-24 DIAGNOSIS — D649 Anemia, unspecified: Secondary | ICD-10-CM | POA: Diagnosis not present

## 2016-12-24 DIAGNOSIS — E1122 Type 2 diabetes mellitus with diabetic chronic kidney disease: Secondary | ICD-10-CM | POA: Diagnosis not present

## 2016-12-24 DIAGNOSIS — I480 Paroxysmal atrial fibrillation: Secondary | ICD-10-CM | POA: Diagnosis not present

## 2016-12-24 DIAGNOSIS — J189 Pneumonia, unspecified organism: Secondary | ICD-10-CM | POA: Diagnosis not present

## 2016-12-24 DIAGNOSIS — N183 Chronic kidney disease, stage 3 (moderate): Secondary | ICD-10-CM | POA: Diagnosis not present

## 2016-12-24 DIAGNOSIS — D631 Anemia in chronic kidney disease: Secondary | ICD-10-CM | POA: Diagnosis not present

## 2016-12-24 DIAGNOSIS — Z955 Presence of coronary angioplasty implant and graft: Secondary | ICD-10-CM | POA: Diagnosis not present

## 2016-12-24 DIAGNOSIS — I509 Heart failure, unspecified: Secondary | ICD-10-CM | POA: Diagnosis not present

## 2016-12-24 DIAGNOSIS — R079 Chest pain, unspecified: Secondary | ICD-10-CM | POA: Diagnosis not present

## 2016-12-24 DIAGNOSIS — J45909 Unspecified asthma, uncomplicated: Secondary | ICD-10-CM | POA: Diagnosis not present

## 2016-12-24 DIAGNOSIS — I5023 Acute on chronic systolic (congestive) heart failure: Secondary | ICD-10-CM | POA: Diagnosis not present

## 2016-12-24 DIAGNOSIS — J9621 Acute and chronic respiratory failure with hypoxia: Secondary | ICD-10-CM | POA: Diagnosis not present

## 2016-12-24 DIAGNOSIS — E784 Other hyperlipidemia: Secondary | ICD-10-CM | POA: Diagnosis not present

## 2016-12-24 DIAGNOSIS — Z794 Long term (current) use of insulin: Secondary | ICD-10-CM | POA: Diagnosis not present

## 2016-12-24 DIAGNOSIS — K219 Gastro-esophageal reflux disease without esophagitis: Secondary | ICD-10-CM | POA: Diagnosis not present

## 2016-12-24 DIAGNOSIS — I110 Hypertensive heart disease with heart failure: Secondary | ICD-10-CM | POA: Diagnosis not present

## 2016-12-24 DIAGNOSIS — I4891 Unspecified atrial fibrillation: Secondary | ICD-10-CM | POA: Diagnosis not present

## 2016-12-24 DIAGNOSIS — E039 Hypothyroidism, unspecified: Secondary | ICD-10-CM | POA: Diagnosis not present

## 2016-12-24 DIAGNOSIS — E1100 Type 2 diabetes mellitus with hyperosmolarity without nonketotic hyperglycemic-hyperosmolar coma (NKHHC): Secondary | ICD-10-CM | POA: Diagnosis not present

## 2016-12-24 DIAGNOSIS — R0602 Shortness of breath: Secondary | ICD-10-CM | POA: Diagnosis not present

## 2016-12-24 DIAGNOSIS — I130 Hypertensive heart and chronic kidney disease with heart failure and stage 1 through stage 4 chronic kidney disease, or unspecified chronic kidney disease: Secondary | ICD-10-CM | POA: Diagnosis not present

## 2016-12-24 DIAGNOSIS — I5040 Unspecified combined systolic (congestive) and diastolic (congestive) heart failure: Secondary | ICD-10-CM | POA: Diagnosis not present

## 2016-12-24 DIAGNOSIS — J181 Lobar pneumonia, unspecified organism: Secondary | ICD-10-CM | POA: Diagnosis not present

## 2016-12-25 DIAGNOSIS — J45909 Unspecified asthma, uncomplicated: Secondary | ICD-10-CM | POA: Diagnosis not present

## 2016-12-25 DIAGNOSIS — I2510 Atherosclerotic heart disease of native coronary artery without angina pectoris: Secondary | ICD-10-CM | POA: Diagnosis not present

## 2016-12-25 DIAGNOSIS — D631 Anemia in chronic kidney disease: Secondary | ICD-10-CM | POA: Diagnosis not present

## 2016-12-25 DIAGNOSIS — J9621 Acute and chronic respiratory failure with hypoxia: Secondary | ICD-10-CM | POA: Diagnosis not present

## 2016-12-25 DIAGNOSIS — I5033 Acute on chronic diastolic (congestive) heart failure: Secondary | ICD-10-CM | POA: Diagnosis not present

## 2016-12-25 DIAGNOSIS — K219 Gastro-esophageal reflux disease without esophagitis: Secondary | ICD-10-CM | POA: Diagnosis not present

## 2016-12-25 DIAGNOSIS — I4891 Unspecified atrial fibrillation: Secondary | ICD-10-CM | POA: Diagnosis not present

## 2016-12-25 DIAGNOSIS — I130 Hypertensive heart and chronic kidney disease with heart failure and stage 1 through stage 4 chronic kidney disease, or unspecified chronic kidney disease: Secondary | ICD-10-CM | POA: Diagnosis not present

## 2016-12-25 DIAGNOSIS — E039 Hypothyroidism, unspecified: Secondary | ICD-10-CM | POA: Diagnosis not present

## 2016-12-26 DIAGNOSIS — I4891 Unspecified atrial fibrillation: Secondary | ICD-10-CM | POA: Diagnosis not present

## 2016-12-26 DIAGNOSIS — E039 Hypothyroidism, unspecified: Secondary | ICD-10-CM | POA: Diagnosis not present

## 2016-12-26 DIAGNOSIS — I130 Hypertensive heart and chronic kidney disease with heart failure and stage 1 through stage 4 chronic kidney disease, or unspecified chronic kidney disease: Secondary | ICD-10-CM | POA: Diagnosis not present

## 2016-12-26 DIAGNOSIS — J45909 Unspecified asthma, uncomplicated: Secondary | ICD-10-CM | POA: Diagnosis not present

## 2016-12-26 DIAGNOSIS — K219 Gastro-esophageal reflux disease without esophagitis: Secondary | ICD-10-CM | POA: Diagnosis not present

## 2016-12-26 DIAGNOSIS — I509 Heart failure, unspecified: Secondary | ICD-10-CM | POA: Diagnosis not present

## 2016-12-26 DIAGNOSIS — J9621 Acute and chronic respiratory failure with hypoxia: Secondary | ICD-10-CM | POA: Diagnosis not present

## 2016-12-26 DIAGNOSIS — D631 Anemia in chronic kidney disease: Secondary | ICD-10-CM | POA: Diagnosis not present

## 2016-12-26 DIAGNOSIS — R0602 Shortness of breath: Secondary | ICD-10-CM | POA: Diagnosis not present

## 2016-12-26 DIAGNOSIS — I2510 Atherosclerotic heart disease of native coronary artery without angina pectoris: Secondary | ICD-10-CM | POA: Diagnosis not present

## 2016-12-26 DIAGNOSIS — I5033 Acute on chronic diastolic (congestive) heart failure: Secondary | ICD-10-CM | POA: Diagnosis not present

## 2016-12-27 DIAGNOSIS — D631 Anemia in chronic kidney disease: Secondary | ICD-10-CM | POA: Diagnosis not present

## 2016-12-27 DIAGNOSIS — K219 Gastro-esophageal reflux disease without esophagitis: Secondary | ICD-10-CM | POA: Diagnosis not present

## 2016-12-27 DIAGNOSIS — J9621 Acute and chronic respiratory failure with hypoxia: Secondary | ICD-10-CM | POA: Diagnosis not present

## 2016-12-27 DIAGNOSIS — I5033 Acute on chronic diastolic (congestive) heart failure: Secondary | ICD-10-CM | POA: Diagnosis not present

## 2016-12-27 DIAGNOSIS — I4891 Unspecified atrial fibrillation: Secondary | ICD-10-CM | POA: Diagnosis not present

## 2016-12-27 DIAGNOSIS — J45909 Unspecified asthma, uncomplicated: Secondary | ICD-10-CM | POA: Diagnosis not present

## 2016-12-27 DIAGNOSIS — I2510 Atherosclerotic heart disease of native coronary artery without angina pectoris: Secondary | ICD-10-CM | POA: Diagnosis not present

## 2016-12-27 DIAGNOSIS — E039 Hypothyroidism, unspecified: Secondary | ICD-10-CM | POA: Diagnosis not present

## 2016-12-27 DIAGNOSIS — I130 Hypertensive heart and chronic kidney disease with heart failure and stage 1 through stage 4 chronic kidney disease, or unspecified chronic kidney disease: Secondary | ICD-10-CM | POA: Diagnosis not present

## 2016-12-28 DIAGNOSIS — R0602 Shortness of breath: Secondary | ICD-10-CM | POA: Diagnosis not present

## 2016-12-28 DIAGNOSIS — I5033 Acute on chronic diastolic (congestive) heart failure: Secondary | ICD-10-CM | POA: Diagnosis not present

## 2016-12-28 DIAGNOSIS — I2510 Atherosclerotic heart disease of native coronary artery without angina pectoris: Secondary | ICD-10-CM | POA: Diagnosis not present

## 2016-12-28 DIAGNOSIS — J9621 Acute and chronic respiratory failure with hypoxia: Secondary | ICD-10-CM | POA: Diagnosis not present

## 2016-12-28 DIAGNOSIS — D631 Anemia in chronic kidney disease: Secondary | ICD-10-CM | POA: Diagnosis not present

## 2016-12-28 DIAGNOSIS — I4891 Unspecified atrial fibrillation: Secondary | ICD-10-CM | POA: Diagnosis not present

## 2016-12-28 DIAGNOSIS — E039 Hypothyroidism, unspecified: Secondary | ICD-10-CM | POA: Diagnosis not present

## 2016-12-28 DIAGNOSIS — J45909 Unspecified asthma, uncomplicated: Secondary | ICD-10-CM | POA: Diagnosis not present

## 2016-12-28 DIAGNOSIS — K219 Gastro-esophageal reflux disease without esophagitis: Secondary | ICD-10-CM | POA: Diagnosis not present

## 2016-12-28 DIAGNOSIS — I130 Hypertensive heart and chronic kidney disease with heart failure and stage 1 through stage 4 chronic kidney disease, or unspecified chronic kidney disease: Secondary | ICD-10-CM | POA: Diagnosis not present

## 2016-12-29 DIAGNOSIS — I4891 Unspecified atrial fibrillation: Secondary | ICD-10-CM | POA: Diagnosis not present

## 2016-12-29 DIAGNOSIS — K219 Gastro-esophageal reflux disease without esophagitis: Secondary | ICD-10-CM | POA: Diagnosis not present

## 2016-12-29 DIAGNOSIS — J9621 Acute and chronic respiratory failure with hypoxia: Secondary | ICD-10-CM | POA: Diagnosis not present

## 2016-12-29 DIAGNOSIS — I130 Hypertensive heart and chronic kidney disease with heart failure and stage 1 through stage 4 chronic kidney disease, or unspecified chronic kidney disease: Secondary | ICD-10-CM | POA: Diagnosis not present

## 2016-12-29 DIAGNOSIS — I2510 Atherosclerotic heart disease of native coronary artery without angina pectoris: Secondary | ICD-10-CM | POA: Diagnosis not present

## 2016-12-29 DIAGNOSIS — D631 Anemia in chronic kidney disease: Secondary | ICD-10-CM | POA: Diagnosis not present

## 2016-12-29 DIAGNOSIS — I5033 Acute on chronic diastolic (congestive) heart failure: Secondary | ICD-10-CM | POA: Diagnosis not present

## 2016-12-29 DIAGNOSIS — J45909 Unspecified asthma, uncomplicated: Secondary | ICD-10-CM | POA: Diagnosis not present

## 2016-12-29 DIAGNOSIS — E039 Hypothyroidism, unspecified: Secondary | ICD-10-CM | POA: Diagnosis not present

## 2016-12-30 DIAGNOSIS — I5033 Acute on chronic diastolic (congestive) heart failure: Secondary | ICD-10-CM | POA: Diagnosis not present

## 2016-12-30 DIAGNOSIS — D631 Anemia in chronic kidney disease: Secondary | ICD-10-CM | POA: Diagnosis not present

## 2016-12-30 DIAGNOSIS — I4891 Unspecified atrial fibrillation: Secondary | ICD-10-CM | POA: Diagnosis not present

## 2016-12-30 DIAGNOSIS — I130 Hypertensive heart and chronic kidney disease with heart failure and stage 1 through stage 4 chronic kidney disease, or unspecified chronic kidney disease: Secondary | ICD-10-CM | POA: Diagnosis not present

## 2016-12-30 DIAGNOSIS — J9621 Acute and chronic respiratory failure with hypoxia: Secondary | ICD-10-CM | POA: Diagnosis not present

## 2016-12-30 DIAGNOSIS — E039 Hypothyroidism, unspecified: Secondary | ICD-10-CM | POA: Diagnosis not present

## 2016-12-30 DIAGNOSIS — J45909 Unspecified asthma, uncomplicated: Secondary | ICD-10-CM | POA: Diagnosis not present

## 2016-12-30 DIAGNOSIS — K219 Gastro-esophageal reflux disease without esophagitis: Secondary | ICD-10-CM | POA: Diagnosis not present

## 2016-12-30 DIAGNOSIS — I2510 Atherosclerotic heart disease of native coronary artery without angina pectoris: Secondary | ICD-10-CM | POA: Diagnosis not present

## 2016-12-31 DIAGNOSIS — I4891 Unspecified atrial fibrillation: Secondary | ICD-10-CM | POA: Diagnosis not present

## 2016-12-31 DIAGNOSIS — J45909 Unspecified asthma, uncomplicated: Secondary | ICD-10-CM | POA: Diagnosis not present

## 2016-12-31 DIAGNOSIS — I130 Hypertensive heart and chronic kidney disease with heart failure and stage 1 through stage 4 chronic kidney disease, or unspecified chronic kidney disease: Secondary | ICD-10-CM | POA: Diagnosis not present

## 2016-12-31 DIAGNOSIS — E039 Hypothyroidism, unspecified: Secondary | ICD-10-CM | POA: Diagnosis not present

## 2016-12-31 DIAGNOSIS — J9621 Acute and chronic respiratory failure with hypoxia: Secondary | ICD-10-CM | POA: Diagnosis not present

## 2016-12-31 DIAGNOSIS — I2510 Atherosclerotic heart disease of native coronary artery without angina pectoris: Secondary | ICD-10-CM | POA: Diagnosis not present

## 2016-12-31 DIAGNOSIS — I5033 Acute on chronic diastolic (congestive) heart failure: Secondary | ICD-10-CM | POA: Diagnosis not present

## 2016-12-31 DIAGNOSIS — K219 Gastro-esophageal reflux disease without esophagitis: Secondary | ICD-10-CM | POA: Diagnosis not present

## 2016-12-31 DIAGNOSIS — D631 Anemia in chronic kidney disease: Secondary | ICD-10-CM | POA: Diagnosis not present

## 2017-01-01 DIAGNOSIS — I509 Heart failure, unspecified: Secondary | ICD-10-CM | POA: Diagnosis not present

## 2017-01-01 DIAGNOSIS — J9621 Acute and chronic respiratory failure with hypoxia: Secondary | ICD-10-CM | POA: Diagnosis not present

## 2017-01-01 DIAGNOSIS — I5040 Unspecified combined systolic (congestive) and diastolic (congestive) heart failure: Secondary | ICD-10-CM | POA: Diagnosis not present

## 2017-01-01 DIAGNOSIS — J80 Acute respiratory distress syndrome: Secondary | ICD-10-CM | POA: Diagnosis not present

## 2017-01-01 DIAGNOSIS — J45909 Unspecified asthma, uncomplicated: Secondary | ICD-10-CM | POA: Diagnosis not present

## 2017-01-01 DIAGNOSIS — K219 Gastro-esophageal reflux disease without esophagitis: Secondary | ICD-10-CM | POA: Diagnosis not present

## 2017-01-01 DIAGNOSIS — I2510 Atherosclerotic heart disease of native coronary artery without angina pectoris: Secondary | ICD-10-CM | POA: Diagnosis not present

## 2017-01-01 DIAGNOSIS — I5033 Acute on chronic diastolic (congestive) heart failure: Secondary | ICD-10-CM | POA: Diagnosis not present

## 2017-01-01 DIAGNOSIS — Z794 Long term (current) use of insulin: Secondary | ICD-10-CM | POA: Diagnosis not present

## 2017-01-01 DIAGNOSIS — R42 Dizziness and giddiness: Secondary | ICD-10-CM | POA: Diagnosis not present

## 2017-01-01 DIAGNOSIS — D649 Anemia, unspecified: Secondary | ICD-10-CM | POA: Diagnosis not present

## 2017-01-01 DIAGNOSIS — J449 Chronic obstructive pulmonary disease, unspecified: Secondary | ICD-10-CM | POA: Diagnosis not present

## 2017-01-01 DIAGNOSIS — E039 Hypothyroidism, unspecified: Secondary | ICD-10-CM | POA: Diagnosis not present

## 2017-01-01 DIAGNOSIS — R05 Cough: Secondary | ICD-10-CM | POA: Diagnosis not present

## 2017-01-01 DIAGNOSIS — R69 Illness, unspecified: Secondary | ICD-10-CM | POA: Diagnosis not present

## 2017-01-01 DIAGNOSIS — E784 Other hyperlipidemia: Secondary | ICD-10-CM | POA: Diagnosis not present

## 2017-01-01 DIAGNOSIS — E1100 Type 2 diabetes mellitus with hyperosmolarity without nonketotic hyperglycemic-hyperosmolar coma (NKHHC): Secondary | ICD-10-CM | POA: Diagnosis not present

## 2017-01-01 DIAGNOSIS — I130 Hypertensive heart and chronic kidney disease with heart failure and stage 1 through stage 4 chronic kidney disease, or unspecified chronic kidney disease: Secondary | ICD-10-CM | POA: Diagnosis not present

## 2017-01-01 DIAGNOSIS — J189 Pneumonia, unspecified organism: Secondary | ICD-10-CM | POA: Diagnosis not present

## 2017-01-01 DIAGNOSIS — E1122 Type 2 diabetes mellitus with diabetic chronic kidney disease: Secondary | ICD-10-CM | POA: Diagnosis not present

## 2017-01-01 DIAGNOSIS — I480 Paroxysmal atrial fibrillation: Secondary | ICD-10-CM | POA: Diagnosis not present

## 2017-01-01 DIAGNOSIS — J181 Lobar pneumonia, unspecified organism: Secondary | ICD-10-CM | POA: Diagnosis not present

## 2017-01-01 DIAGNOSIS — I4891 Unspecified atrial fibrillation: Secondary | ICD-10-CM | POA: Diagnosis not present

## 2017-01-01 DIAGNOSIS — D631 Anemia in chronic kidney disease: Secondary | ICD-10-CM | POA: Diagnosis not present

## 2017-01-01 DIAGNOSIS — I5030 Unspecified diastolic (congestive) heart failure: Secondary | ICD-10-CM | POA: Diagnosis not present

## 2017-01-01 DIAGNOSIS — I517 Cardiomegaly: Secondary | ICD-10-CM | POA: Diagnosis not present

## 2017-01-01 DIAGNOSIS — N183 Chronic kidney disease, stage 3 (moderate): Secondary | ICD-10-CM | POA: Diagnosis not present

## 2017-01-01 DIAGNOSIS — I5023 Acute on chronic systolic (congestive) heart failure: Secondary | ICD-10-CM | POA: Diagnosis not present

## 2017-01-03 DIAGNOSIS — J449 Chronic obstructive pulmonary disease, unspecified: Secondary | ICD-10-CM | POA: Diagnosis not present

## 2017-01-03 DIAGNOSIS — N183 Chronic kidney disease, stage 3 (moderate): Secondary | ICD-10-CM | POA: Diagnosis not present

## 2017-01-03 DIAGNOSIS — I4891 Unspecified atrial fibrillation: Secondary | ICD-10-CM | POA: Diagnosis not present

## 2017-01-03 DIAGNOSIS — E1122 Type 2 diabetes mellitus with diabetic chronic kidney disease: Secondary | ICD-10-CM | POA: Diagnosis not present

## 2017-01-07 DIAGNOSIS — I4891 Unspecified atrial fibrillation: Secondary | ICD-10-CM | POA: Diagnosis not present

## 2017-01-07 DIAGNOSIS — I5030 Unspecified diastolic (congestive) heart failure: Secondary | ICD-10-CM | POA: Diagnosis not present

## 2017-01-07 DIAGNOSIS — R42 Dizziness and giddiness: Secondary | ICD-10-CM | POA: Diagnosis not present

## 2017-01-07 DIAGNOSIS — R05 Cough: Secondary | ICD-10-CM | POA: Diagnosis not present

## 2017-01-17 DIAGNOSIS — N183 Chronic kidney disease, stage 3 (moderate): Secondary | ICD-10-CM | POA: Diagnosis not present

## 2017-01-17 DIAGNOSIS — E1122 Type 2 diabetes mellitus with diabetic chronic kidney disease: Secondary | ICD-10-CM | POA: Diagnosis not present

## 2017-01-17 DIAGNOSIS — I480 Paroxysmal atrial fibrillation: Secondary | ICD-10-CM | POA: Diagnosis not present

## 2017-01-17 DIAGNOSIS — I5023 Acute on chronic systolic (congestive) heart failure: Secondary | ICD-10-CM | POA: Diagnosis not present

## 2017-01-17 DIAGNOSIS — M6281 Muscle weakness (generalized): Secondary | ICD-10-CM | POA: Diagnosis not present

## 2017-01-21 DIAGNOSIS — N183 Chronic kidney disease, stage 3 (moderate): Secondary | ICD-10-CM | POA: Diagnosis not present

## 2017-01-21 DIAGNOSIS — I480 Paroxysmal atrial fibrillation: Secondary | ICD-10-CM | POA: Diagnosis not present

## 2017-01-21 DIAGNOSIS — I5023 Acute on chronic systolic (congestive) heart failure: Secondary | ICD-10-CM | POA: Diagnosis not present

## 2017-01-21 DIAGNOSIS — M6281 Muscle weakness (generalized): Secondary | ICD-10-CM | POA: Diagnosis not present

## 2017-01-21 DIAGNOSIS — E1122 Type 2 diabetes mellitus with diabetic chronic kidney disease: Secondary | ICD-10-CM | POA: Diagnosis not present

## 2017-01-22 DIAGNOSIS — I480 Paroxysmal atrial fibrillation: Secondary | ICD-10-CM | POA: Diagnosis not present

## 2017-01-22 DIAGNOSIS — E1122 Type 2 diabetes mellitus with diabetic chronic kidney disease: Secondary | ICD-10-CM | POA: Diagnosis not present

## 2017-01-22 DIAGNOSIS — I5023 Acute on chronic systolic (congestive) heart failure: Secondary | ICD-10-CM | POA: Diagnosis not present

## 2017-01-22 DIAGNOSIS — M6281 Muscle weakness (generalized): Secondary | ICD-10-CM | POA: Diagnosis not present

## 2017-01-22 DIAGNOSIS — N183 Chronic kidney disease, stage 3 (moderate): Secondary | ICD-10-CM | POA: Diagnosis not present

## 2017-01-23 DIAGNOSIS — N183 Chronic kidney disease, stage 3 (moderate): Secondary | ICD-10-CM | POA: Diagnosis not present

## 2017-01-23 DIAGNOSIS — M6281 Muscle weakness (generalized): Secondary | ICD-10-CM | POA: Diagnosis not present

## 2017-01-23 DIAGNOSIS — E1122 Type 2 diabetes mellitus with diabetic chronic kidney disease: Secondary | ICD-10-CM | POA: Diagnosis not present

## 2017-01-23 DIAGNOSIS — I5023 Acute on chronic systolic (congestive) heart failure: Secondary | ICD-10-CM | POA: Diagnosis not present

## 2017-01-23 DIAGNOSIS — I480 Paroxysmal atrial fibrillation: Secondary | ICD-10-CM | POA: Diagnosis not present

## 2017-01-24 DIAGNOSIS — I480 Paroxysmal atrial fibrillation: Secondary | ICD-10-CM | POA: Diagnosis not present

## 2017-01-24 DIAGNOSIS — N183 Chronic kidney disease, stage 3 (moderate): Secondary | ICD-10-CM | POA: Diagnosis not present

## 2017-01-24 DIAGNOSIS — I5023 Acute on chronic systolic (congestive) heart failure: Secondary | ICD-10-CM | POA: Diagnosis not present

## 2017-01-24 DIAGNOSIS — M6281 Muscle weakness (generalized): Secondary | ICD-10-CM | POA: Diagnosis not present

## 2017-01-24 DIAGNOSIS — E1122 Type 2 diabetes mellitus with diabetic chronic kidney disease: Secondary | ICD-10-CM | POA: Diagnosis not present

## 2017-01-31 DIAGNOSIS — E1122 Type 2 diabetes mellitus with diabetic chronic kidney disease: Secondary | ICD-10-CM | POA: Diagnosis not present

## 2017-01-31 DIAGNOSIS — I5023 Acute on chronic systolic (congestive) heart failure: Secondary | ICD-10-CM | POA: Diagnosis not present

## 2017-01-31 DIAGNOSIS — I480 Paroxysmal atrial fibrillation: Secondary | ICD-10-CM | POA: Diagnosis not present

## 2017-01-31 DIAGNOSIS — N183 Chronic kidney disease, stage 3 (moderate): Secondary | ICD-10-CM | POA: Diagnosis not present

## 2017-02-07 DIAGNOSIS — J189 Pneumonia, unspecified organism: Secondary | ICD-10-CM | POA: Diagnosis not present

## 2017-02-13 ENCOUNTER — Encounter: Payer: Self-pay | Admitting: *Deleted

## 2017-02-28 DIAGNOSIS — R070 Pain in throat: Secondary | ICD-10-CM | POA: Diagnosis not present

## 2017-02-28 DIAGNOSIS — J180 Bronchopneumonia, unspecified organism: Secondary | ICD-10-CM | POA: Diagnosis not present

## 2017-02-28 DIAGNOSIS — R51 Headache: Secondary | ICD-10-CM | POA: Diagnosis not present

## 2017-03-07 DIAGNOSIS — Z1231 Encounter for screening mammogram for malignant neoplasm of breast: Secondary | ICD-10-CM | POA: Diagnosis not present

## 2017-07-07 IMAGING — CT CT ABD-PELV W/ CM
1 of 3 series · 14 of 32 positions shown, 19 images · IV contrast (omnipaque)
Comparison: 11/19/2012

CLINICAL DATA: Right upper quadrant pain.  Bilious emesis.

EXAM:
CT ABDOMEN AND PELVIS WITH CONTRAST
TECHNIQUE: Multidetector CT imaging of the abdomen and pelvis was performed
using the standard protocol following bolus administration of
intravenous contrast.
CONTRAST:  100mL OMNIPAQUE IOHEXOL 300 MG/ML  SOLN

[Series 2: routine abd pel with · axial · 0.90mm/px · z∈[-460,-45]mm · 14 of 93 slices shown, 19 images]
[im 5/93  soft-tissue]
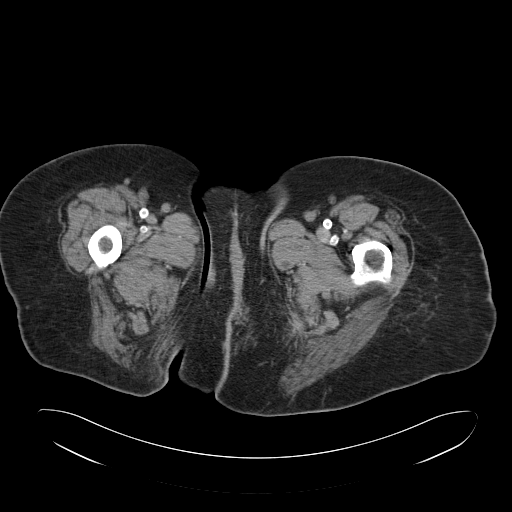
[im 5/93  bone]
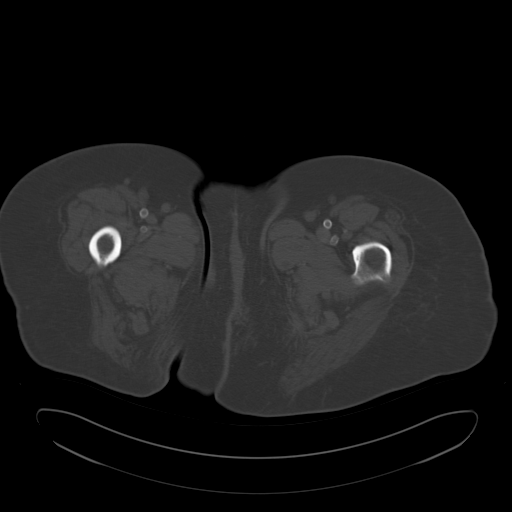
[im 15/93  soft-tissue]
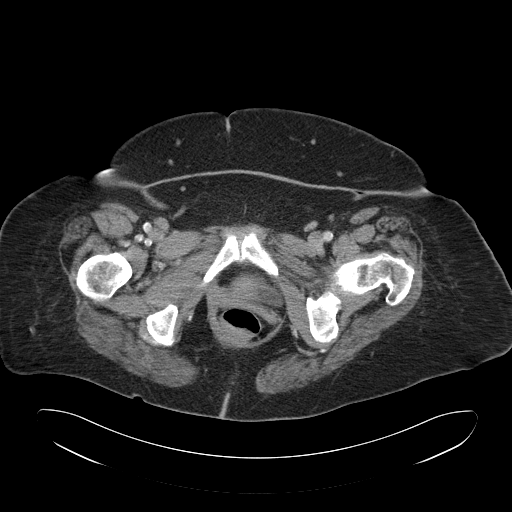
[im 20/93  soft-tissue]
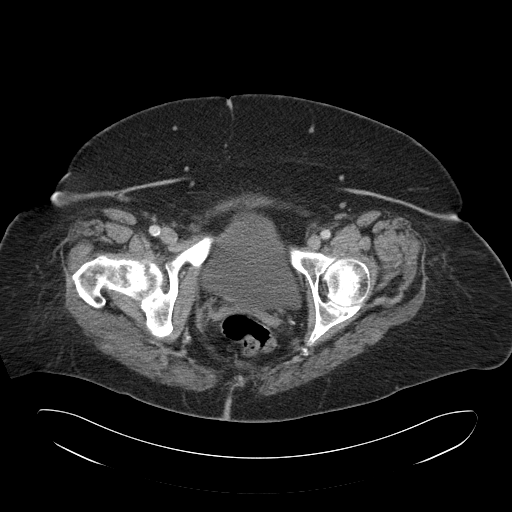
[im 25/93  soft-tissue]
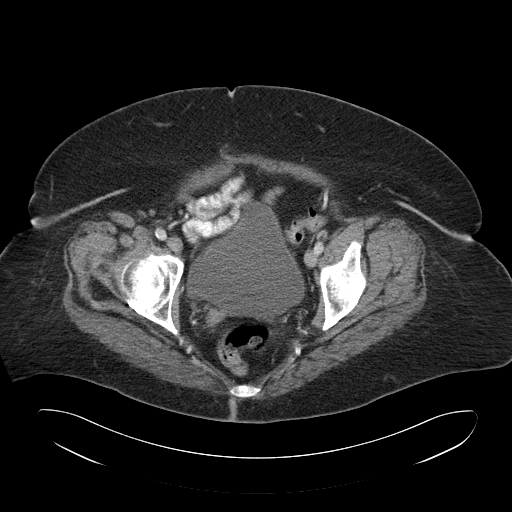
[im 34/93  soft-tissue]
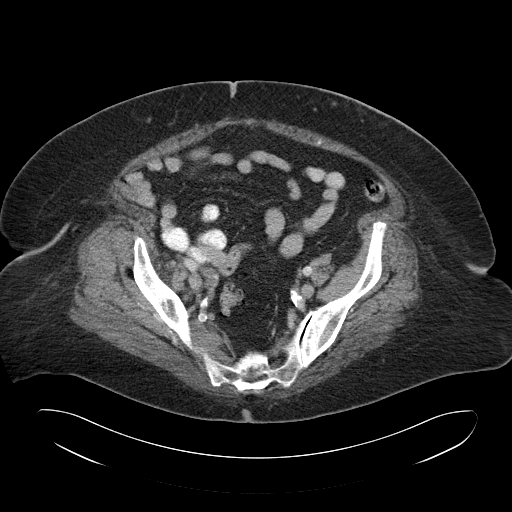
[im 39/93  soft-tissue]
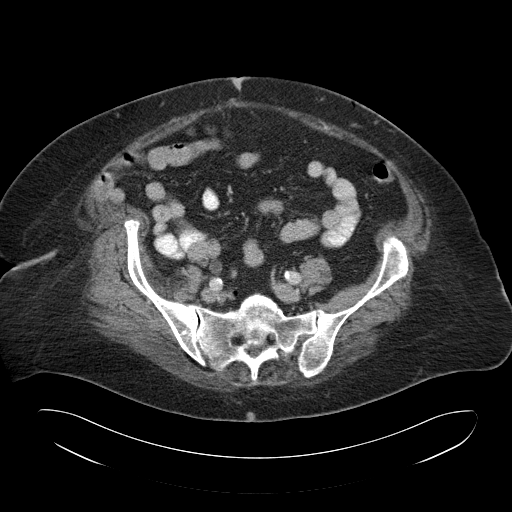
[im 49/93  soft-tissue]
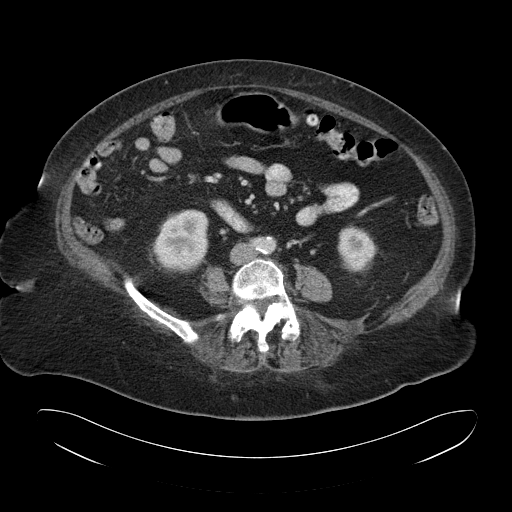
[im 54/93  soft-tissue]
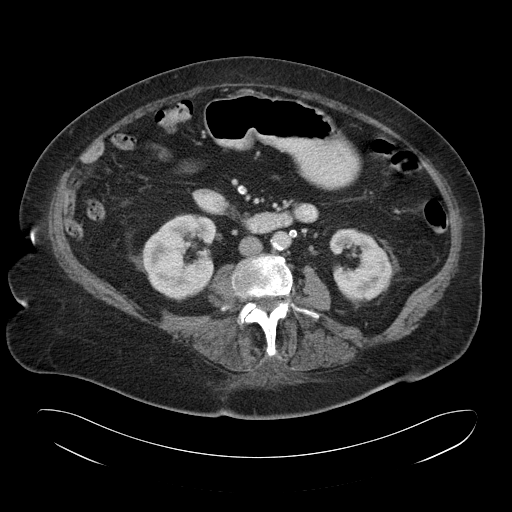
[im 59/93  soft-tissue]
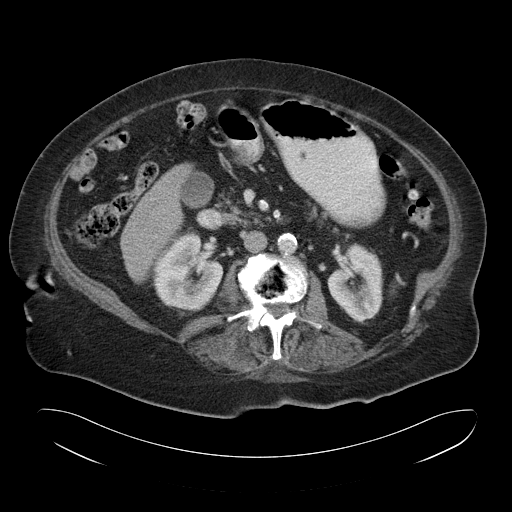
[im 59/93  bone]
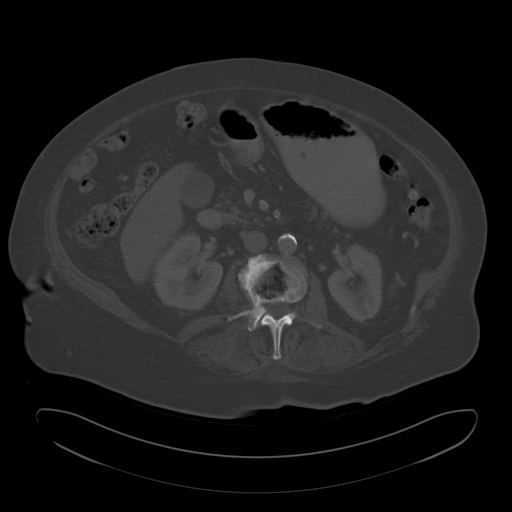
[im 68/93  soft-tissue]
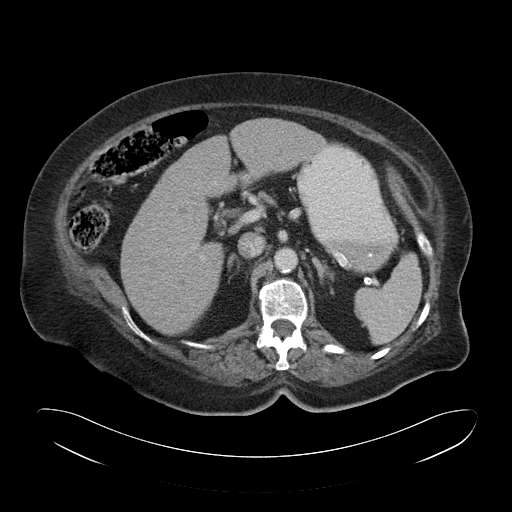
[im 73/93  soft-tissue]
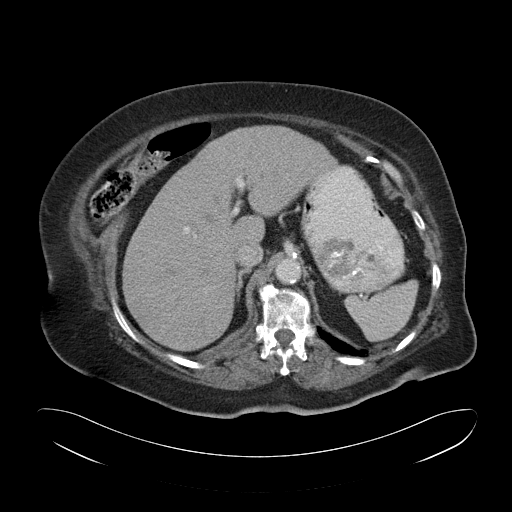
[im 73/93  lung]
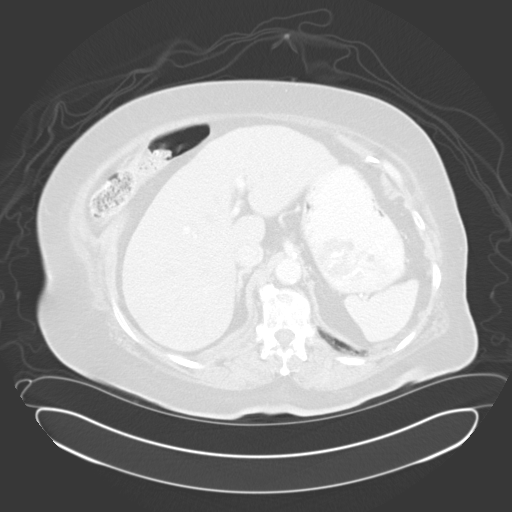
[im 78/93  soft-tissue]
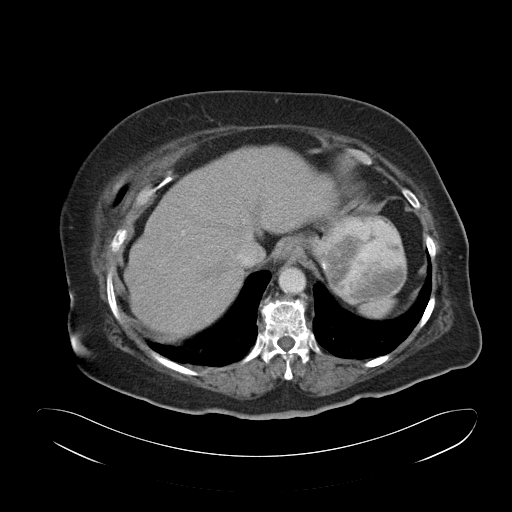
[im 78/93  lung]
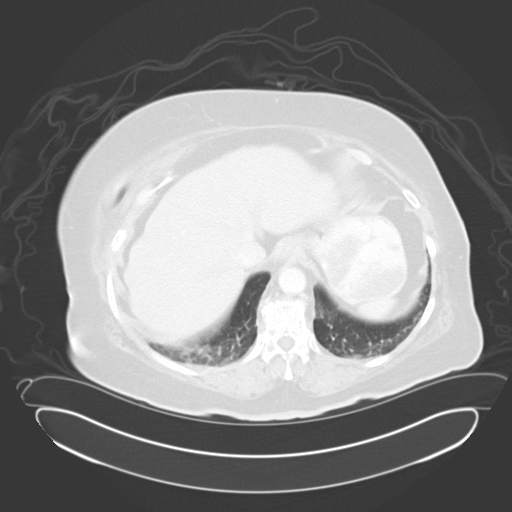
[im 83/93  lung]
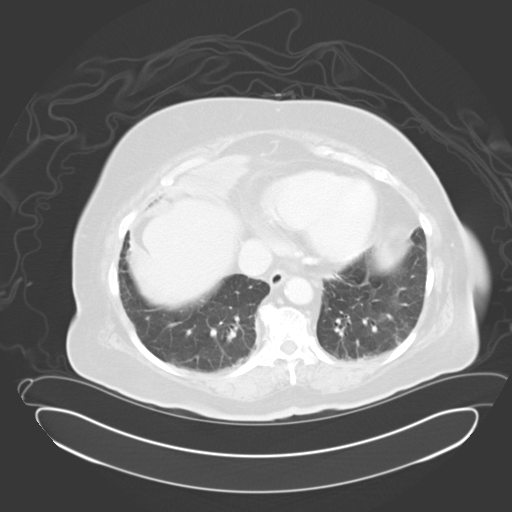
[im 88/93  soft-tissue]
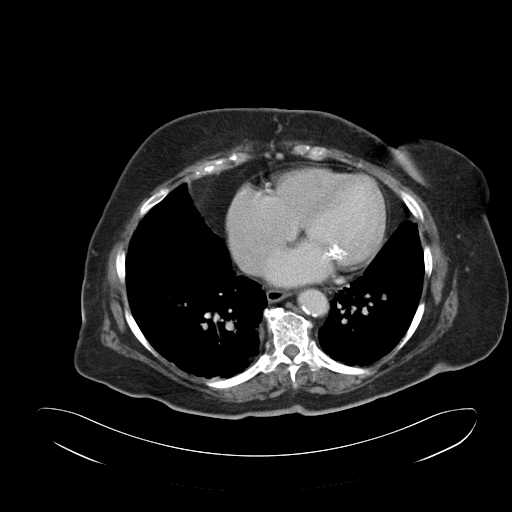
[im 88/93  lung]
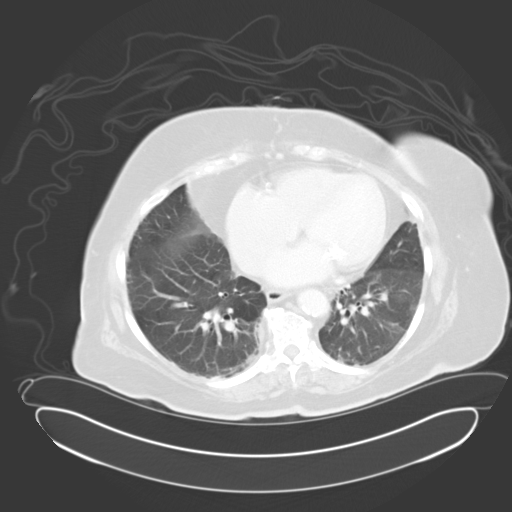

[14 of 32 positions shown; findings below may reference images not displayed]

FINDINGS: Lower chest and abdominal wall:  Small fatty periumbilical hernia.

Chronic cardiomegaly.  Extensive RCA atherosclerotic calcification.

Hepatobiliary: Incidental capsular based calcification along the
upper right liver.Layering calculi or high-density sludge in the
fundus. No evidence of acute cholecystitis. No bile duct
enlargement.

Pancreas: Unremarkable.

Spleen: Unremarkable.

Adrenals/Urinary Tract: Negative adrenals. No hydronephrosis or
stone. Unremarkable bladder.

Reproductive:Hysterectomy with negative adnexa.

Stomach/Bowel: Lax ileocolic mesentery with right upper quadrant
appendix. No bowel obstruction or inflammatory process. Colonic
diverticulosis.

Vascular/Lymphatic: No acute vascular abnormality. No mass or
adenopathy.

Peritoneal: No ascites or pneumoperitoneum.

Musculoskeletal: Nondisplaced lateral right eighth rib fracture.
Diffuse and advanced spinal degeneration with severe L4-5 facet
arthropathy and grade 1 anterolisthesis.
IMPRESSION: 1. Nondisplaced right eighth rib fracture.
2. No acute intra-abdominal finding.
3. Chronic findings are described above.

## 2018-07-16 DIAGNOSIS — I340 Nonrheumatic mitral (valve) insufficiency: Secondary | ICD-10-CM

## 2018-07-16 DIAGNOSIS — I361 Nonrheumatic tricuspid (valve) insufficiency: Secondary | ICD-10-CM

## 2018-12-01 ENCOUNTER — Emergency Department (HOSPITAL_COMMUNITY): Payer: Medicare HMO

## 2018-12-01 ENCOUNTER — Encounter (HOSPITAL_COMMUNITY): Payer: Self-pay

## 2018-12-01 ENCOUNTER — Inpatient Hospital Stay (HOSPITAL_COMMUNITY)
Admission: EM | Admit: 2018-12-01 | Discharge: 2018-12-16 | DRG: 065 | Disposition: A | Discharge status: Skilled Nursing Facility | Payer: Medicare HMO | Attending: Internal Medicine | Admitting: Internal Medicine

## 2018-12-01 ENCOUNTER — Other Ambulatory Visit: Payer: Self-pay

## 2018-12-01 DIAGNOSIS — Z20828 Contact with and (suspected) exposure to other viral communicable diseases: Secondary | ICD-10-CM | POA: Diagnosis present

## 2018-12-01 DIAGNOSIS — Z794 Long term (current) use of insulin: Secondary | ICD-10-CM | POA: Diagnosis not present

## 2018-12-01 DIAGNOSIS — E1165 Type 2 diabetes mellitus with hyperglycemia: Secondary | ICD-10-CM | POA: Diagnosis present

## 2018-12-01 DIAGNOSIS — Z7901 Long term (current) use of anticoagulants: Secondary | ICD-10-CM | POA: Diagnosis not present

## 2018-12-01 DIAGNOSIS — N183 Chronic kidney disease, stage 3 (moderate): Secondary | ICD-10-CM | POA: Diagnosis present

## 2018-12-01 DIAGNOSIS — I6381 Other cerebral infarction due to occlusion or stenosis of small artery: Secondary | ICD-10-CM | POA: Diagnosis not present

## 2018-12-01 DIAGNOSIS — E1122 Type 2 diabetes mellitus with diabetic chronic kidney disease: Secondary | ICD-10-CM | POA: Diagnosis present

## 2018-12-01 DIAGNOSIS — I130 Hypertensive heart and chronic kidney disease with heart failure and stage 1 through stage 4 chronic kidney disease, or unspecified chronic kidney disease: Secondary | ICD-10-CM | POA: Diagnosis present

## 2018-12-01 DIAGNOSIS — E1159 Type 2 diabetes mellitus with other circulatory complications: Secondary | ICD-10-CM | POA: Diagnosis not present

## 2018-12-01 DIAGNOSIS — J9811 Atelectasis: Secondary | ICD-10-CM | POA: Diagnosis present

## 2018-12-01 DIAGNOSIS — W19XXXA Unspecified fall, initial encounter: Secondary | ICD-10-CM | POA: Diagnosis present

## 2018-12-01 DIAGNOSIS — I63513 Cerebral infarction due to unspecified occlusion or stenosis of bilateral middle cerebral arteries: Secondary | ICD-10-CM | POA: Diagnosis not present

## 2018-12-01 DIAGNOSIS — Z9842 Cataract extraction status, left eye: Secondary | ICD-10-CM | POA: Diagnosis not present

## 2018-12-01 DIAGNOSIS — Z96641 Presence of right artificial hip joint: Secondary | ICD-10-CM | POA: Diagnosis present

## 2018-12-01 DIAGNOSIS — R197 Diarrhea, unspecified: Secondary | ICD-10-CM | POA: Diagnosis not present

## 2018-12-01 DIAGNOSIS — N179 Acute kidney failure, unspecified: Secondary | ICD-10-CM

## 2018-12-01 DIAGNOSIS — I480 Paroxysmal atrial fibrillation: Secondary | ICD-10-CM | POA: Diagnosis present

## 2018-12-01 DIAGNOSIS — R1312 Dysphagia, oropharyngeal phase: Secondary | ICD-10-CM | POA: Diagnosis present

## 2018-12-01 DIAGNOSIS — N289 Disorder of kidney and ureter, unspecified: Secondary | ICD-10-CM | POA: Diagnosis not present

## 2018-12-01 DIAGNOSIS — K5730 Diverticulosis of large intestine without perforation or abscess without bleeding: Secondary | ICD-10-CM | POA: Diagnosis present

## 2018-12-01 DIAGNOSIS — Z90711 Acquired absence of uterus with remaining cervical stump: Secondary | ICD-10-CM

## 2018-12-01 DIAGNOSIS — Z9181 History of falling: Secondary | ICD-10-CM | POA: Diagnosis not present

## 2018-12-01 DIAGNOSIS — Z8673 Personal history of transient ischemic attack (TIA), and cerebral infarction without residual deficits: Secondary | ICD-10-CM | POA: Diagnosis not present

## 2018-12-01 DIAGNOSIS — E785 Hyperlipidemia, unspecified: Secondary | ICD-10-CM | POA: Diagnosis not present

## 2018-12-01 DIAGNOSIS — E1136 Type 2 diabetes mellitus with diabetic cataract: Secondary | ICD-10-CM | POA: Diagnosis present

## 2018-12-01 DIAGNOSIS — R4702 Dysphasia: Secondary | ICD-10-CM | POA: Diagnosis not present

## 2018-12-01 DIAGNOSIS — M8580 Other specified disorders of bone density and structure, unspecified site: Secondary | ICD-10-CM | POA: Diagnosis present

## 2018-12-01 DIAGNOSIS — M4186 Other forms of scoliosis, lumbar region: Secondary | ICD-10-CM | POA: Diagnosis present

## 2018-12-01 DIAGNOSIS — I5032 Chronic diastolic (congestive) heart failure: Secondary | ICD-10-CM | POA: Diagnosis present

## 2018-12-01 DIAGNOSIS — Z9841 Cataract extraction status, right eye: Secondary | ICD-10-CM | POA: Diagnosis not present

## 2018-12-01 DIAGNOSIS — R32 Unspecified urinary incontinence: Secondary | ICD-10-CM | POA: Diagnosis not present

## 2018-12-01 DIAGNOSIS — R471 Dysarthria and anarthria: Secondary | ICD-10-CM | POA: Diagnosis present

## 2018-12-01 DIAGNOSIS — M47816 Spondylosis without myelopathy or radiculopathy, lumbar region: Secondary | ICD-10-CM | POA: Diagnosis present

## 2018-12-01 DIAGNOSIS — K439 Ventral hernia without obstruction or gangrene: Secondary | ICD-10-CM | POA: Diagnosis present

## 2018-12-01 DIAGNOSIS — K219 Gastro-esophageal reflux disease without esophagitis: Secondary | ICD-10-CM | POA: Diagnosis present

## 2018-12-01 DIAGNOSIS — Z96651 Presence of right artificial knee joint: Secondary | ICD-10-CM | POA: Diagnosis present

## 2018-12-01 DIAGNOSIS — N189 Chronic kidney disease, unspecified: Secondary | ICD-10-CM | POA: Diagnosis not present

## 2018-12-01 DIAGNOSIS — I639 Cerebral infarction, unspecified: Secondary | ICD-10-CM | POA: Diagnosis present

## 2018-12-01 DIAGNOSIS — M069 Rheumatoid arthritis, unspecified: Secondary | ICD-10-CM | POA: Diagnosis present

## 2018-12-01 DIAGNOSIS — Z833 Family history of diabetes mellitus: Secondary | ICD-10-CM

## 2018-12-01 DIAGNOSIS — M4316 Spondylolisthesis, lumbar region: Secondary | ICD-10-CM | POA: Diagnosis present

## 2018-12-01 DIAGNOSIS — E860 Dehydration: Secondary | ICD-10-CM | POA: Diagnosis present

## 2018-12-01 DIAGNOSIS — I10 Essential (primary) hypertension: Secondary | ICD-10-CM | POA: Diagnosis not present

## 2018-12-01 DIAGNOSIS — I482 Chronic atrial fibrillation, unspecified: Secondary | ICD-10-CM | POA: Diagnosis not present

## 2018-12-01 DIAGNOSIS — R739 Hyperglycemia, unspecified: Secondary | ICD-10-CM

## 2018-12-01 DIAGNOSIS — I4891 Unspecified atrial fibrillation: Secondary | ICD-10-CM | POA: Diagnosis not present

## 2018-12-01 DIAGNOSIS — I63411 Cerebral infarction due to embolism of right middle cerebral artery: Secondary | ICD-10-CM

## 2018-12-01 DIAGNOSIS — R414 Neurologic neglect syndrome: Secondary | ICD-10-CM | POA: Diagnosis present

## 2018-12-01 DIAGNOSIS — Z91120 Patient's intentional underdosing of medication regimen due to financial hardship: Secondary | ICD-10-CM | POA: Diagnosis not present

## 2018-12-01 DIAGNOSIS — E039 Hypothyroidism, unspecified: Secondary | ICD-10-CM | POA: Diagnosis present

## 2018-12-01 DIAGNOSIS — I2510 Atherosclerotic heart disease of native coronary artery without angina pectoris: Secondary | ICD-10-CM | POA: Diagnosis present

## 2018-12-01 DIAGNOSIS — R4701 Aphasia: Secondary | ICD-10-CM | POA: Diagnosis not present

## 2018-12-01 DIAGNOSIS — R531 Weakness: Secondary | ICD-10-CM

## 2018-12-01 DIAGNOSIS — R1310 Dysphagia, unspecified: Secondary | ICD-10-CM | POA: Diagnosis not present

## 2018-12-01 DIAGNOSIS — S0083XA Contusion of other part of head, initial encounter: Secondary | ICD-10-CM | POA: Diagnosis present

## 2018-12-01 DIAGNOSIS — Z6835 Body mass index (BMI) 35.0-35.9, adult: Secondary | ICD-10-CM

## 2018-12-01 DIAGNOSIS — Z955 Presence of coronary angioplasty implant and graft: Secondary | ICD-10-CM

## 2018-12-01 DIAGNOSIS — H53462 Homonymous bilateral field defects, left side: Secondary | ICD-10-CM | POA: Diagnosis present

## 2018-12-01 DIAGNOSIS — Z800 Family history of malignant neoplasm of digestive organs: Secondary | ICD-10-CM

## 2018-12-01 DIAGNOSIS — R29810 Facial weakness: Secondary | ICD-10-CM | POA: Diagnosis present

## 2018-12-01 DIAGNOSIS — I5030 Unspecified diastolic (congestive) heart failure: Secondary | ICD-10-CM | POA: Diagnosis not present

## 2018-12-01 DIAGNOSIS — I361 Nonrheumatic tricuspid (valve) insufficiency: Secondary | ICD-10-CM | POA: Diagnosis not present

## 2018-12-01 DIAGNOSIS — G8194 Hemiplegia, unspecified affecting left nondominant side: Secondary | ICD-10-CM | POA: Diagnosis not present

## 2018-12-01 DIAGNOSIS — M1990 Unspecified osteoarthritis, unspecified site: Secondary | ICD-10-CM | POA: Diagnosis present

## 2018-12-01 DIAGNOSIS — I700 Atherosclerosis of aorta: Secondary | ICD-10-CM | POA: Diagnosis present

## 2018-12-01 DIAGNOSIS — E669 Obesity, unspecified: Secondary | ICD-10-CM | POA: Diagnosis present

## 2018-12-01 DIAGNOSIS — I63511 Cerebral infarction due to unspecified occlusion or stenosis of right middle cerebral artery: Secondary | ICD-10-CM | POA: Diagnosis not present

## 2018-12-01 LAB — COMPREHENSIVE METABOLIC PANEL
ALT: 13 U/L (ref 0–44)
AST: 15 U/L (ref 15–41)
Albumin: 3.4 g/dL — ABNORMAL LOW (ref 3.5–5.0)
Alkaline Phosphatase: 111 U/L (ref 38–126)
Anion gap: 8 (ref 5–15)
BUN: 31 mg/dL — ABNORMAL HIGH (ref 8–23)
CO2: 20 mmol/L — ABNORMAL LOW (ref 22–32)
Calcium: 8.8 mg/dL — ABNORMAL LOW (ref 8.9–10.3)
Chloride: 109 mmol/L (ref 98–111)
Creatinine, Ser: 1.69 mg/dL — ABNORMAL HIGH (ref 0.44–1.00)
GFR calc Af Amer: 32 mL/min — ABNORMAL LOW (ref >60)
GFR calc non Af Amer: 28 mL/min — ABNORMAL LOW (ref >60)
Glucose, Bld: 297 mg/dL — ABNORMAL HIGH (ref 70–99)
Potassium: 4.6 mmol/L (ref 3.5–5.1)
Sodium: 137 mmol/L (ref 135–145)
Total Bilirubin: 0.9 mg/dL (ref 0.3–1.2)
Total Protein: 6.7 g/dL (ref 6.5–8.1)

## 2018-12-01 LAB — URINALYSIS, ROUTINE W REFLEX MICROSCOPIC
Bilirubin Urine: NEGATIVE
Glucose, UA: 150 mg/dL — AB
Ketones, ur: NEGATIVE mg/dL
Nitrite: POSITIVE — AB
Protein, ur: 100 mg/dL — AB
Specific Gravity, Urine: 1.011 (ref 1.005–1.030)
WBC, UA: >50 WBC/hpf — ABNORMAL HIGH (ref 0–5)
pH: 5 (ref 5.0–8.0)

## 2018-12-01 LAB — CBC
HCT: 32.6 % — ABNORMAL LOW (ref 36.0–46.0)
Hemoglobin: 10.6 g/dL — ABNORMAL LOW (ref 12.0–15.0)
MCH: 30.7 pg (ref 26.0–34.0)
MCHC: 32.5 g/dL (ref 30.0–36.0)
MCV: 94.5 fL (ref 80.0–100.0)
Platelets: 237 10*3/uL (ref 150–400)
RBC: 3.45 MIL/uL — ABNORMAL LOW (ref 3.87–5.11)
RDW: 13.1 % (ref 11.5–15.5)
WBC: 9.2 10*3/uL (ref 4.0–10.5)
nRBC: 0 % (ref 0.0–0.2)

## 2018-12-01 LAB — DIFFERENTIAL
Basophils Absolute: 0 10*3/uL (ref 0.0–0.1)
Basophils Relative: 0 %
Eosinophils Absolute: 0.1 10*3/uL (ref 0.0–0.5)
Eosinophils Relative: 1 %
Lymphocytes Relative: 12 %
Lymphs Abs: 1.1 10*3/uL (ref 0.7–4.0)
Monocytes Absolute: 0.7 10*3/uL (ref 0.1–1.0)
Monocytes Relative: 7 %
Neutro Abs: 7.3 10*3/uL (ref 1.7–7.7)
Neutrophils Relative %: 80 %

## 2018-12-01 LAB — CBG MONITORING, ED: Glucose-Capillary: 276 mg/dL — ABNORMAL HIGH (ref 70–99)

## 2018-12-01 LAB — PROTIME-INR
INR: 1.1 (ref 0.8–1.2)
Prothrombin Time: 14.3 seconds (ref 11.4–15.2)

## 2018-12-01 LAB — RAPID URINE DRUG SCREEN, HOSP PERFORMED
Amphetamines: NOT DETECTED
Barbiturates: NOT DETECTED
Benzodiazepines: NOT DETECTED
Cocaine: NOT DETECTED
Opiates: NOT DETECTED
Tetrahydrocannabinol: NOT DETECTED

## 2018-12-01 LAB — ETHANOL: Alcohol, Ethyl (B): <10 mg/dL (ref <10)

## 2018-12-01 LAB — APTT: aPTT: 25 seconds (ref 24–36)

## 2018-12-01 LAB — SARS CORONAVIRUS 2 BY RT PCR (HOSPITAL ORDER, PERFORMED IN ~~LOC~~ HOSPITAL LAB): SARS Coronavirus 2: NEGATIVE

## 2018-12-01 MED ORDER — STROKE: EARLY STAGES OF RECOVERY BOOK
Freq: Once | Status: AC
  Administered 2018-12-02: 01:00:00
  Filled 2018-12-01: qty 1

## 2018-12-01 MED ORDER — SODIUM CHLORIDE 0.9 % IV BOLUS (SEPSIS)
500.0000 mL | Freq: Once | INTRAVENOUS | Status: AC
  Administered 2018-12-01: 500 mL via INTRAVENOUS

## 2018-12-01 MED ORDER — SENNOSIDES-DOCUSATE SODIUM 8.6-50 MG PO TABS: 1.0000 | ORAL_TABLET | Freq: Every evening | ORAL | Status: DC | PRN

## 2018-12-01 MED ORDER — SODIUM CHLORIDE 0.9 % IV SOLN
INTRAVENOUS | Status: AC
  Administered 2018-12-02: 01:00:00 via INTRAVENOUS

## 2018-12-01 MED ORDER — SODIUM CHLORIDE 0.9 % IV SOLN
1000.0000 mL | INTRAVENOUS | Status: DC
  Administered 2018-12-01: 1000 mL via INTRAVENOUS

## 2018-12-01 MED ORDER — ENOXAPARIN SODIUM 30 MG/0.3ML ~~LOC~~ SOLN: 30.0000 mg | SUBCUTANEOUS | Status: DC

## 2018-12-01 MED ORDER — ACETAMINOPHEN 160 MG/5ML PO SOLN
650.0000 mg | ORAL | Status: DC | PRN
  Administered 2018-12-05: 650 mg
  Filled 2018-12-01: qty 20.3

## 2018-12-01 MED ORDER — SODIUM CHLORIDE 0.9% FLUSH
3.0000 mL | Freq: Once | INTRAVENOUS | Status: AC
  Administered 2018-12-01: 3 mL via INTRAVENOUS

## 2018-12-01 MED ORDER — ASPIRIN 300 MG RE SUPP
300.0000 mg | Freq: Once | RECTAL | Status: AC
  Administered 2018-12-02: 300 mg via RECTAL
  Filled 2018-12-01: qty 1

## 2018-12-01 MED ORDER — ACETAMINOPHEN 325 MG PO TABS: 650.0000 mg | ORAL_TABLET | ORAL | Status: DC | PRN

## 2018-12-01 MED ORDER — ASPIRIN 325 MG PO TABS: 325.0000 mg | ORAL_TABLET | Freq: Once | ORAL | Status: AC

## 2018-12-01 MED ORDER — ACETAMINOPHEN 650 MG RE SUPP: 650.0000 mg | RECTAL | Status: DC | PRN

## 2018-12-01 MED ORDER — NYSTATIN 100000 UNIT/GM EX POWD
Freq: Three times a day (TID) | CUTANEOUS | Status: DC
  Administered 2018-12-02 – 2018-12-16 (×41): via TOPICAL
  Filled 2018-12-01: qty 15

## 2018-12-01 NOTE — ED Notes (Signed)
Family updated on plan of care  

## 2018-12-01 NOTE — Consult Note (Signed)
NEURO HOSPITALIST CONSULT NOTE   Requestig physician: Dr. Lynelle Doctor  Reason for Consult: New onset of left sided weakness  History obtained from:   Chart    HPI:                                                                                                                                          [name] [name] is an 84 y.o. female with a history of atrial fibrillation and left MCA stroke who presents to the ED with new onset of left sided weakness after first being noted to be unresponsive at her place of residence. EMS noted generalized weakness on scene. Of note, she has had diarrhea for 1 week and has been complaining of headache and fatigue. CBG was 352 per EMS, decreased to 276 on arrival to the ED.   The patient endorsed walking with her walker and then falling. She denied symptoms of injury other than left hip soreness.   CT head in the ED revealed subtle right parietal hypodensity concerning for possible subacute ischemic infarction. Chronic left MCA stroke was also noted.   Past Medical History:  Diagnosis Date   Asthma    CAD (coronary artery disease)    a. 2000 s/p PCI RCA;  b. 2005 PCI of RCA 2/2 ISR; c. 04/2013 Neg MV;  d. 05/2013 Cath: LM 20, LAD 30p, LCX 20p, OM1 90 small, RCA 43m (PTCA - FFR 0.86), PDA 40.   Cerebral infarction (HCC)    Chronic diastolic CHF (congestive heart failure) (HCC)    a. 10/2013 Echo: EF 60-65%, no rwma, Gr1 DD, mild MR, mildly dil LA.   CKD (chronic kidney disease), stage III (HCC)    DM (diabetes mellitus) (HCC)    Dyslipidemia    GERD (gastroesophageal reflux disease)    Hypertensive heart disease    Hypothyroidism    hx   Obesity    Osteoarthritis    PAF (paroxysmal atrial fibrillation) (HCC)    a. s/p dccv 04/2014;  b. amio/eliquis;  c. 06/2013 bb/ccb d/c 2/2 symptomatic bradycardia; d. 04/2015 recurrent AF->amio increased/bb resumed.   Rheumatoid arthritis(714.0)    Right rib fracture    a. 04/2015.    Shoulder fracture, left    a. Dr. Martha Clan    Past Surgical History:  Procedure Laterality Date   CARDIAC CATHETERIZATION  06/11/2013   cataract surgery     CORONARY ANGIOPLASTY     ELECTROPHYSIOLOGIC STUDY N/A 07/25/2015   Procedure: CARDIOVERSION;  Surgeon: Antonieta Iba, MD;  Location: [facility] ORS;  Service: Cardiovascular;  Laterality: N/A;   partial hysterectomy     RCA stent placement  2000   REPLACEMENT TOTAL KNEE  2013   right   restenosis with PTCA placement  2005   SHOULDER SURGERY  02/04/13    Family History  Problem Relation Age of Onset   Esophageal cancer Mother    Diabetes Mother    Cancer Mother        esophageal               Social History:  reports that she has never smoked. She has never used smokeless tobacco. She reports that she does not drink alcohol or use drugs.  Allergies  Allergen Reactions   Oxycodone Nausea And Vomiting    HOME MEDICATIONS:                                                                                                                        ROS:                                                                                                                                       Limited ROS reveals no fevers, chills, headache, vomiting, SOB or cough. The patient is a poor historian.   Temperature 98.1 F (36.7 C), temperature source Oral, height 5\' 3"  (1.6 m), weight 90.7 kg, SpO2 97 %.   General Examination:                                                                                                       Physical Exam  HEENT-  [place]/AT    Lungs- Wheezing noted.  Extremities- Warm and well-perfused    Neurological Examination Mental Status: Alert, oriented to city, state, day and year. Able to answer basic questions and follow simple commands. Intact naming for common objects. Had difficulty with a 3 step directional command. Mild dysarthria noted. Mild left hemineglect.  Cranial Nerves: II: Temporal  visual fields intact when tested individually. Has extinction on the left with DSS.   III,IV, VI: Gazes to the left and right with conjugate EOM, but has tendency for gaze to drift to the right.  V,VII: Mild left facial droop. Temp sensation equal  bilaterally.  VIII: hearing intact to voice IX,X: No hypophonia XI: Tends to rotate head to right XII: midline tongue extension Motor: Will lift BUE antigravity and hold outstretched without drift. When RUE is passively lifted, it remains elevated; when LUE is passively lifted, it drops back to bed when released. Increased tone of LUE noted in conjunction with weak grip, but will resist with 4/5 strength in flexion and extension at elbow. RUE with 4+/5 strength. Withdraws BLE to plantar stimulation, right with normal response, left with less brisk response.  Sensory: Decreased temp sensation to RUE and BLE at and distal to thighs. FT intact x 4. Unreliable responses regarding DSS.  Deep Tendon Reflexes: 1+ bilateral brachioradialis and biceps. 0 patellae and achilles bilaterally. Downgoing toe on right, upgoing toe on left.  Plantars: Right: downgoing   Left: upgoing Cerebellar: No ataxia with FNF on right. Positive for ataxia with FNF on left. Unable/unwilling to perform HS bilaterally  Gait: Deferred   Lab Results: Basic Metabolic Panel: Recent Labs  Lab 12/01/18 1900  NA 137  K 4.6  CL 109  CO2 20*  GLUCOSE 297*  BUN 31*  CREATININE 1.69*  CALCIUM 8.8*    CBC: Recent Labs  Lab 12/01/18 1900  WBC 9.2  NEUTROABS 7.3  HGB 10.6*  HCT 32.6*  MCV 94.5  PLT 237    Cardiac Enzymes: No results for input(s): CKTOTAL, CKMB, CKMBINDEX, TROPONINI in the last 168 hours.  Lipid Panel: No results for input(s): CHOL, TRIG, HDL, CHOLHDL, VLDL, LDLCALC in the last 168 hours.  Imaging: Dg Chest 1 View  Result Date: 12/01/2018 CLINICAL DATA:  Left-sided weakness and bruising following a fall today. EXAM: CHEST  1 VIEW COMPARISON:   07/15/2018. FINDINGS: No significant change in enlargement of the cardiac silhouette. Interval minimal linear atelectasis or scarring in the left mid lung zone. Otherwise, clear lungs with normal vascularity. No fracture or pneumothorax seen. No pleural fluid. Thoracic spine degenerative changes. IMPRESSION: No acute abnormality. Stable cardiomegaly. Electronically Signed   By: Claudie Revering M.D.   On: 12/01/2018 19:35   Ct Head Wo Contrast  Result Date: 12/01/2018 CLINICAL DATA:  Left-sided weakness. EXAM: CT HEAD WITHOUT CONTRAST TECHNIQUE: Contiguous axial images were obtained from the base of the skull through the vertex without intravenous contrast. COMPARISON:  Head CT 07/15/2018 and MRI 07/16/2018 FINDINGS: Brain: A moderate-sized chronic left MCA infarct is again noted centered in the frontal operculum. Patchy hypodensities are again seen in the cerebral white matter bilaterally, nonspecific but compatible with moderate chronic small vessel ischemic disease. There is the suggestion of new mild asymmetric hypoattenuation of right parietal white matter with questionably diminished gray-white differentiation (for example series 2, images 18 and 19). No acute intracranial hemorrhage, midline shift, or extra-axial fluid collection is identified. There is mild cerebral atrophy. A 1 cm focus of extra-axial calcification posteriorly over the right frontal convexity is unchanged and may represent a meningioma. Vascular: Calcified atherosclerosis at the skull base. No hyperdense vessel. Skull: No fracture or focal osseous lesion. Sinuses/Orbits: Clear paranasal sinuses. Trace mastoid effusions. Bilateral cataract extraction. Other: None. IMPRESSION: 1. Possible early acute right parietal infarct. 2. No acute intracranial hemorrhage. 3. Chronic ischemia including an old left MCA infarct. Electronically Signed   By: Logan Bores M.D.   On: 12/01/2018 20:34   Dg Hip Unilat W Or Wo Pelvis 2-3 Views Left  Result  Date: 12/01/2018 CLINICAL DATA:  Left-sided weakness and bruising following a fall today. EXAM: DG HIP (WITH  OR WITHOUT PELVIS) 2-3V LEFT COMPARISON:  10/23/2015. FINDINGS: Interval right hip prosthesis. Diffuse osteopenia. No fracture or dislocation seen. Lower lumbar spine degenerative changes. Extensive arterial calcifications. IMPRESSION: No acute abnormality. Electronically Signed   By: Beckie SaltsSteven  Reid M.D.   On: 12/01/2018 19:36    Assessment: 84 year old female with prior left MCA stroke presenting with new onset of left sided weakness and corresponding subtle hypodensity within the right parietofrontal region on CT 1. Exam reveals chronic left > right sided weakness and mild left sided neglect, consistent with right parietal-frontal stroke.  2. Stroke risk factors: CAD, CHF, DM, prior stroke, dyslipidemia, PAF  Recommendations: 1. HgbA1c, fasting lipid panel 2. MRI/MRA of the brain without contrast 3. PT consult, OT consult, Speech consult 4. Echocardiogram 5. Carotid ultrasound 6. Prophylactic therapy- Add Plavix to ASA  7. Risk factor modification 8. Telemetry monitoring 9. Frequent neuro checks 10 NPO until passes stroke swallow screen 11. Permissive HTN modified protocol given advanced age: Treat SBP if > 180 then decrease by 15% per day after first 24 hours.  12. Hold off on statin for now as risks most likely outweigh benefits given patient's advanced age 84. please page stroke NP  Or  PA  Or MD from 8am -4 pm  as this patient from this time will be  followed by the stroke.   You can look them up on [email or web address]  Password [email or web address]    Electronically signed: Dr. Caryl PinaEric Nevelyn Mellott 12/01/2018, 9:47 PM

## 2018-12-01 NOTE — ED Provider Notes (Signed)
[facility] EMERGENCY DEPARTMENT Provider Note   CSN: [id number] Arrival date & time: 12/01/18  8099    History   Chief Complaint Chief Complaint  Patient presents with   Weakness    HPI [name] [name] is a 84 y.o. female.     HPI Patient presents to the emergency room for evaluation of weakness.  According to the EMS report they were initially called because of unresponsiveness and questionable left-sided weakness.  On arrival EMS found that the patient was generally weak.  They were told that the patient had been having issues with diarrhea for the past week.  It is difficult to get a clear history from the patient.  She cannot tell me when she started to feel weak.  She does remember trying to walk with her walker and falling.  She denies any injuries from the falls other than her left hip being a little sore.  She denies any headache or diarrhea.  She denies any vomiting or abdominal pain.  No fevers or chills.  No cough or shortness of breath. Past Medical History:  Diagnosis Date   Asthma    CAD (coronary artery disease)    a. 2000 s/p PCI RCA;  b. 2005 PCI of RCA 2/2 ISR; c. 04/2013 Neg MV;  d. 05/2013 Cath: LM 20, LAD 30p, LCX 20p, OM1 90 small, RCA 16m (PTCA - FFR 0.86), PDA 40.   Cerebral infarction (HCC)    Chronic diastolic CHF (congestive heart failure) ([place])    a. 10/2013 Echo: EF 60-65%, no rwma, Gr1 DD, mild MR, mildly dil LA.   CKD (chronic kidney disease), stage III (HCC)    DM (diabetes mellitus) ([place])    Dyslipidemia    GERD (gastroesophageal reflux disease)    Hypertensive heart disease    Hypothyroidism    hx   Obesity    Osteoarthritis    PAF (paroxysmal atrial fibrillation) ([place])    a. s/p dccv 04/2014;  b. amio/eliquis;  c. 06/2013 bb/ccb d/c 2/2 symptomatic bradycardia; d. 04/2015 recurrent AF->amio increased/bb resumed.   Rheumatoid arthritis(714.0)    Right rib fracture    a. 04/2015.   Shoulder fracture, left     a. Dr. Mack Guise    Patient Active Problem List   Diagnosis Date Noted   Stage 2 Pressure ulcer, sacrum 11/25/2015   Infection of right prosthetic hip joint ([place]) 11/24/2015   Foreign body of hip with infection    Essential hypertension    Pulmonary hypertension ([place])    Acute renal failure ([place])    Uncontrolled type 2 diabetes mellitus with complication (HCC)    Postoperative wound infection of right hip    MSSA infection, non-invasive    Infection caused by Enterobacter cloacae    Paroxysmal atrial fibrillation (HCC)    Acute blood loss anemia    Neurogenic bladder    Sinus bradycardia 11/20/2015   HCAP (healthcare-associated pneumonia)    Acute renal failure (ARF) ([place])    Pressure ulcer 11/17/2015   Sepsis ([place]) 11/16/2015   AKI (acute kidney injury) ([place])    Metabolic acidosis    Hypocalcemia    Acute respiratory failure with hypoxia (HCC)    Hypertensive heart disease    Acute on chronic diastolic CHF (congestive heart failure) ([place])    Coronary artery disease involving native coronary artery of native heart without angina pectoris    Persistent atrial fibrillation    Dyspnea 83/38/2505   Follicular acne 39/76/7341   Acute  diastolic heart failure (HCC) 06/16/2015   Routine history and physical examination of adult 01/13/2015   Dermatitis 06/27/2014   CKD (chronic kidney disease), stage III (HCC)    Bradycardia 06/24/2014   Diabetes mellitus type 2, uncontrolled (HCC) 03/25/2014   Depression 03/25/2014   Medicare annual wellness visit, subsequent 01/10/2014   Anxiety state 01/10/2014   Carotid stenosis 11/16/2013   CVA (cerebral infarction) 10/21/2013   Screening for breast cancer 01/14/2013   Poorly controlled type 2 diabetes mellitus with circulatory disorder (HCC) 02/04/2012   Chronic UTI 06/12/2011   GERD (gastroesophageal reflux disease) 06/12/2011   Hyperlipidemia 06/12/2011   Hypertension 05/17/2011    Hypothyroidism 03/07/2011   Chronic back pain 02/21/2011   Morbid obesity (HCC) 08/17/2008   CAD (coronary artery disease) 08/17/2008   Atrial fibrillation (HCC) 08/17/2008   Osteoarthritis, knee 08/17/2008    Past Surgical History:  Procedure Laterality Date   CARDIAC CATHETERIZATION  06/11/2013   cataract surgery     CORONARY ANGIOPLASTY     ELECTROPHYSIOLOGIC STUDY N/A 07/25/2015   Procedure: CARDIOVERSION;  Surgeon: Antonieta Ibaimothy J Gollan, MD;  Location: [facility] ORS;  Service: Cardiovascular;  Laterality: N/A;   partial hysterectomy     RCA stent placement  2000   REPLACEMENT TOTAL KNEE  2013   right   restenosis with PTCA placement  2005   SHOULDER SURGERY  02/04/13     OB History   No obstetric history on file.      Home Medications    Prior to Admission medications   Medication Sig Start Date End Date Taking? Authorizing Provider  albuterol (PROVENTIL HFA;VENTOLIN HFA) 108 (90 Base) MCG/ACT inhaler Inhale 2 puffs into the lungs every 6 (six) hours as needed for wheezing or shortness of breath. 07/27/15   Alford HighlandWieting, Richard, MD  Amino Acids-Protein Hydrolys (FEEDING SUPPLEMENT, PRO-STAT SUGAR FREE 64,) LIQD Take 30 mLs by mouth 3 (three) times daily. 11/27/15   Johnson, Clanford L, MD  amiodarone (PACERONE) 200 MG tablet Take 1 tablet (200 mg total) by mouth daily. 08/15/15   Dunn, Raymon Muttonyan M, PA-C  amLODipine (NORVASC) 10 MG tablet Take 1 tablet (10 mg total) by mouth daily. 10/05/15   Shelia MediaWalker, Jennifer A, MD  apixaban (ELIQUIS) 5 MG TABS tablet Take 1 tablet (5 mg total) by mouth 2 (two) times daily. 10/20/15   Shelia MediaWalker, Jennifer A, MD  atorvastatin (LIPITOR) 20 MG tablet Take 1 tablet (20 mg total) by mouth daily. Patient taking differently: Take 20 mg by mouth daily at 6 PM.  08/31/15   Shelia MediaWalker, Jennifer A, MD  feeding supplement (BOOST / RESOURCE BREEZE) LIQD Take 1 Container by mouth 3 (three) times daily with meals. 11/27/15   Johnson, Clanford L, MD  ferrous sulfate 325 (65 FE)  MG tablet Take 325 mg by mouth daily with breakfast.    [provider]  furosemide (LASIX) 80 MG tablet Take 1 tablet (80 mg total) by mouth daily. 11/27/15   Johnson, Clanford L, MD  insulin glargine (LANTUS) 100 UNIT/ML injection Inject 0.18 mLs (18 Units total) into the skin daily. 11/27/15   Johnson, Clanford L, MD  Lactobacillus (ACIDOPHILUS PO) Take 1 tablet by mouth daily. 30 day course following joint replacement surgery    [provider]  latanoprost (XALATAN) 0.005 % ophthalmic solution Place 1 drop into both eyes at bedtime.     [provider]  levothyroxine (SYNTHROID, LEVOTHROID) 150 MCG tablet Take 1 tablet (150 mcg total) by mouth daily before breakfast. 08/31/15  Shelia Media, MD  nitroGLYCERIN (NITROSTAT) 0.4 MG SL tablet Place 0.4 mg under the tongue every 5 (five) minutes as needed for chest pain.    [provider]  omeprazole (PRILOSEC) 20 MG capsule Take 1 capsule (20 mg total) by mouth 2 (two) times daily. 10/20/15 02/22/17  Shelia Media, MD  OXYGEN Inhale 2 L into the lungs continuous.    [provider]  potassium chloride SA (K-DUR,KLOR-CON) 20 MEQ tablet Take 2 tablets (40 mEq total) by mouth 2 (two) times daily. 11/27/15   Cleora Fleet, MD  RELION INSULIN SYR 0.5ML/31G 31G X 5/16" 0.5 ML MISC  10/17/15   [provider]  sertraline (ZOLOFT) 50 MG tablet Take 1 tablet (50 mg total) by mouth daily. 05/27/14   Shelia Media, MD    Family History Family History  Problem Relation Age of Onset   Esophageal cancer Mother    Diabetes Mother    Cancer Mother        esophageal     Social History Social History   Tobacco Use   Smoking status: Never Smoker   Smokeless tobacco: Never Used   Tobacco comment: former passive smoker  Substance Use Topics   Alcohol use: No   Drug use: No     Allergies   Oxycodone   Review of Systems Review of Systems   Physical Exam Updated Vital  Signs Temp 98.1 F (36.7 C) (Oral)    Ht 1.6 m (5\' 3" )    Wt 90.7 kg    SpO2 97% Comment: ra   BMI 35.43 kg/m   Physical Exam Vitals signs and nursing note reviewed.  Constitutional:      General: She is not in acute distress.    Appearance: She is well-developed.  HENT:     Head: Normocephalic and atraumatic.     Right Ear: External ear normal.     Left Ear: External ear normal.  Eyes:     General: No scleral icterus.       Right eye: No discharge.        Left eye: No discharge.     Conjunctiva/sclera: Conjunctivae normal.  Neck:     Musculoskeletal: Neck supple.     Trachea: No tracheal deviation.  Cardiovascular:     Rate and Rhythm: Normal rate and regular rhythm.  Pulmonary:     Effort: Pulmonary effort is normal. No respiratory distress.     Breath sounds: Normal breath sounds. No stridor. No wheezing or rales.  Abdominal:     General: Bowel sounds are normal. There is no distension.     Palpations: Abdomen is soft.     Tenderness: There is no abdominal tenderness. There is no guarding or rebound.  Musculoskeletal:        General: No tenderness.  Skin:    General: Skin is warm and dry.     Findings: No rash.  Neurological:     Mental Status: She is alert and oriented to person, place, and time.     Cranial Nerves: Cranial nerve deficit (No facial droop, extraocular movements intact, tongue midline, speech slurred  ) present.     Sensory: No sensory deficit.     Motor: No abnormal muscle tone or seizure activity.     Coordination: Coordination normal.     Comments: Pronator  drift left upper extrem, able to lift both legs off the bed, ?weaker on left,  sensation intact in all extremities, no visual field  cuts, no left or right sided neglect, no nystagmus noted       ED Treatments / Results  Labs (all labs ordered are listed, but only abnormal results are displayed) Labs Reviewed  CBC - Abnormal; Notable for the following components:      Result Value   RBC  3.45 (*)    Hemoglobin 10.6 (*)    HCT 32.6 (*)    All other components within normal limits  URINALYSIS, ROUTINE W REFLEX MICROSCOPIC - Abnormal; Notable for the following components:   APPearance HAZY (*)    Glucose, UA 150 (*)    Hgb urine dipstick LARGE (*)    Protein, ur 100 (*)    Nitrite POSITIVE (*)    Leukocytes,Ua MODERATE (*)    WBC, UA >50 (*)    Bacteria, UA MANY (*)    All other components within normal limits  COMPREHENSIVE METABOLIC PANEL - Abnormal; Notable for the following components:   CO2 20 (*)    Glucose, Bld 297 (*)    BUN 31 (*)    Creatinine, Ser 1.69 (*)    Calcium 8.8 (*)    Albumin 3.4 (*)    GFR calc non Af Amer 28 (*)    GFR calc Af Amer 32 (*)    All other components within normal limits  CBG MONITORING, ED - Abnormal; Notable for the following components:   Glucose-Capillary 276 (*)    All other components within normal limits  SARS CORONAVIRUS 2 (HOSPITAL ORDER, PERFORMED IN [facility])  ETHANOL  PROTIME-INR  APTT  DIFFERENTIAL  RAPID URINE DRUG SCREEN, HOSP PERFORMED  CBG MONITORING, ED    EKG EKG Interpretation  Date/Time:  Tuesday December 01 2018 18:28:39 EDT Ventricular Rate:  64 PR Interval:    QRS Duration: 114 QT Interval:  470 QTC Calculation: 493 R Axis:   86 Text Interpretation:  probable sinus rhythm Anterior infarct, old Nonspecific repol abnormality, lateral leads poor data quality Confirmed by Linwood DibblesKnapp, Akashdeep Chuba [phone]) on 12/01/2018 6:54:36 PM   Radiology Dg Chest 1 View  Result Date: 12/01/2018 CLINICAL DATA:  Left-sided weakness and bruising following a fall today. EXAM: CHEST  1 VIEW COMPARISON:  07/15/2018. FINDINGS: No significant change in enlargement of the cardiac silhouette. Interval minimal linear atelectasis or scarring in the left mid lung zone. Otherwise, clear lungs with normal vascularity. No fracture or pneumothorax seen. No pleural fluid. Thoracic spine degenerative changes. IMPRESSION: No acute  abnormality. Stable cardiomegaly. Electronically Signed   By: Beckie SaltsSteven  Reid M.D.   On: 12/01/2018 19:35   Ct Head Wo Contrast  Result Date: 12/01/2018 CLINICAL DATA:  Left-sided weakness. EXAM: CT HEAD WITHOUT CONTRAST TECHNIQUE: Contiguous axial images were obtained from the base of the skull through the vertex without intravenous contrast. COMPARISON:  Head CT 07/15/2018 and MRI 07/16/2018 FINDINGS: Brain: A moderate-sized chronic left MCA infarct is again noted centered in the frontal operculum. Patchy hypodensities are again seen in the cerebral white matter bilaterally, nonspecific but compatible with moderate chronic small vessel ischemic disease. There is the suggestion of new mild asymmetric hypoattenuation of right parietal white matter with questionably diminished gray-white differentiation (for example series 2, images 18 and 19). No acute intracranial hemorrhage, midline shift, or extra-axial fluid collection is identified. There is mild cerebral atrophy. A 1 cm focus of extra-axial calcification posteriorly over the right frontal convexity is unchanged and may represent a meningioma. Vascular: Calcified atherosclerosis at the skull base. No hyperdense vessel. Skull: No  fracture or focal osseous lesion. Sinuses/Orbits: Clear paranasal sinuses. Trace mastoid effusions. Bilateral cataract extraction. Other: None. IMPRESSION: 1. Possible early acute right parietal infarct. 2. No acute intracranial hemorrhage. 3. Chronic ischemia including an old left MCA infarct. Electronically Signed   By: Sebastian Ache M.D.   On: 12/01/2018 20:34   Dg Hip Unilat W Or Wo Pelvis 2-3 Views Left  Result Date: 12/01/2018 CLINICAL DATA:  Left-sided weakness and bruising following a fall today. EXAM: DG HIP (WITH OR WITHOUT PELVIS) 2-3V LEFT COMPARISON:  10/23/2015. FINDINGS: Interval right hip prosthesis. Diffuse osteopenia. No fracture or dislocation seen. Lower lumbar spine degenerative changes. Extensive arterial  calcifications. IMPRESSION: No acute abnormality. Electronically Signed   By: Beckie Salts M.D.   On: 12/01/2018 19:36    Procedures .Critical Care Performed by: Linwood Dibbles, MD Authorized by: Linwood Dibbles, MD   Critical care provider statement:    Critical care time (minutes):  30   Critical care was time spent personally by me on the following activities:  Discussions with consultants, evaluation of patient's response to treatment, examination of patient, ordering and performing treatments and interventions, ordering and review of laboratory studies, ordering and review of radiographic studies, pulse oximetry, re-evaluation of patient's condition, obtaining history from patient or surrogate and review of old charts   (including critical care time)  Medications Ordered in ED Medications  sodium chloride 0.9 % bolus 500 mL (0 mLs Intravenous Stopped 12/01/18 2140)    Followed by  0.9 %  sodium chloride infusion (1,000 mLs Intravenous New Bag/Given 12/01/18 1942)  sodium chloride flush (NS) 0.9 % injection 3 mL (3 mLs Intravenous Given 12/01/18 1845)     Initial Impression / Assessment and Plan / ED Course  I have reviewed the triage vital signs and the nursing notes.  Pertinent labs & imaging results that were available during my care of the patient were reviewed by me and considered in my medical decision making (see chart for details).  Clinical Course as of Nov 30 2225  Tue Dec 01, 2018  2133 Labs reviewed.  Metabolic panel notable for an AKI as well as hyperglycemia   [JK]  2133 Hemoglobin is stable.   [JK]  2134 CT scan does show possible early acute infarct.  Patient not initially activated as a code stroke as last time seen normal is not clear.  I will consult with neurology.  Plan admission for further treatment   [JK]    Clinical Course User Index [JK] Linwood Dibbles, MD     ED work-up was notable for hyperglycemia as well as acute kidney injury.  This may be related to  dehydration and her diarrhea.  CT scan does show the possibility of an early stroke.  Patient is having left-sided symptoms and this does correlate with the CT scan findings.  When the patient arrived initially we were unable to determine her last time normal.  I eventually was able to speak to the patient's daughter who states her symptoms started around 12.  This would have put her outside TPA window on arrival and she is VAN negative.  Consult with neurology, Dr Otelia Limes.  Consult with medical service and Will admit for further workup.   Final Clinical Impressions(s) / ED Diagnoses   Final diagnoses:  Cerebrovascular accident (CVA), unspecified mechanism (HCC)  AKI (acute kidney injury) (HCC)  Hyperglycemia      Linwood Dibbles, MD 12/01/18 2227

## 2018-12-01 NOTE — ED Notes (Signed)
ED TO INPATIENT HANDOFF REPORT  ED Nurse Name and Phone #: Jess F   S Name/Age/Gender [name] BibleMarjorie E Venhuizen 84 y.o. female Room/Bed: 020C/020C  Code Status   Code Status: Prior  Home/SNF/Other Home Patient oriented to: self, place, time and situation Is this baseline? Yes   Triage Complete: Triage complete  Chief Complaint Altered  Triage Note EMS initially called to unresponsive then stroke symptoms of left sided weakness. On scene EMS finds pt that is generalized weakness. Pt had hx of diarrhea x 1 week . C/O diarrhea, headache and fatique. CBG 276 on arrival. EMS states CBG 352 on scene and given 800 cc ns in route. Bruises on left arm from previous fall. Hx of AFIB and stroke.   Allergies Allergies  Allergen Reactions  . Oxycodone Nausea And Vomiting    Level of Care/Admitting Diagnosis ED Disposition    ED Disposition Condition Comment   Admit  The patient appears reasonably stabilized for admission considering the current resources, flow, and capabilities available in the ED at this time, and I doubt any other [facility] requiring further screening and/or treatment in the ED prior to admission is  present.       B Medical/Surgery History Past Medical History:  Diagnosis Date  . Asthma   . CAD (coronary artery disease)    a. 2000 s/p PCI RCA;  b. 2005 PCI of RCA 2/2 ISR; c. 04/2013 Neg MV;  d. 05/2013 Cath: LM 20, LAD 30p, LCX 20p, OM1 90 small, RCA 4715m (PTCA - FFR 0.86), PDA 40.  Marland Kitchen. Cerebral infarction (HCC)   . Chronic diastolic CHF (congestive heart failure) (HCC)    a. 10/2013 Echo: EF 60-65%, no rwma, Gr1 DD, mild MR, mildly dil LA.  . CKD (chronic kidney disease), stage III (HCC)   . DM (diabetes mellitus) (HCC)   . Dyslipidemia   . GERD (gastroesophageal reflux disease)   . Hypertensive heart disease   . Hypothyroidism    hx  . Obesity   . Osteoarthritis   . PAF (paroxysmal atrial fibrillation) (HCC)    a. s/p dccv 04/2014;  b. amio/eliquis;  c. 06/2013 bb/ccb  d/c 2/2 symptomatic bradycardia; d. 04/2015 recurrent AF->amio increased/bb resumed.  . Rheumatoid arthritis(714.0)   . Right rib fracture    a. 04/2015.  Marland Kitchen. Shoulder fracture, left    a. Dr. Martha ClanKrasinski   Past Surgical History:  Procedure Laterality Date  . CARDIAC CATHETERIZATION  06/11/2013  . cataract surgery    . CORONARY ANGIOPLASTY    . ELECTROPHYSIOLOGIC STUDY N/A 07/25/2015   Procedure: CARDIOVERSION;  Surgeon: Antonieta Ibaimothy J Gollan, MD;  Location: [facility] ORS;  Service: Cardiovascular;  Laterality: N/A;  . partial hysterectomy    . RCA stent placement  2000  . REPLACEMENT TOTAL KNEE  2013   right  . restenosis with PTCA placement  2005  . SHOULDER SURGERY  02/04/13     A IV Location/Drains/Wounds Patient Lines/Drains/Airways Status   Active Line/Drains/Airways    Name:   Placement date:   Placement time:   Site:   Days:   Peripheral IV 11/24/15 Right;Anterior Forearm   11/24/15    1059    Forearm   1103   Peripheral IV 12/01/18 Anterior;Proximal;Right Forearm   12/01/18    1829    Forearm   less than 1   Peripheral IV 12/01/18 Right Hand   12/01/18    1835    Hand   less than 1   PICC Double Lumen 11/25/15  PICC Left Basilic 42 cm 1 cm   11/25/15    1115     1102   Negative Pressure Wound Therapy Hip Right   -    -    -      Rectal Tube/Pouch   11/18/15    2045    -   1109   Urethral Catheter   11/14/15    -    -   1113   Pressure Ulcer 11/24/15 Stage I -  Intact skin with non-blanchable redness of a localized area usually over a bony prominence. partial non-blanchable rednish skin over sacrum    11/24/15    1630     1103          Intake/Output Last 24 hours  Intake/Output Summary (Last 24 hours) at 12/01/2018 2229 Last data filed at 12/01/2018 2140 Gross per 24 hour  Intake 503 ml  Output -  Net 503 ml    Labs/Imaging Results for orders placed or performed during the hospital encounter of 12/01/18 (from the past 48 hour(s))  CBG monitoring, ED     Status: Abnormal    Collection Time: 12/01/18  6:24 PM  Result Value Ref Range   Glucose-Capillary 276 (H) 70 - 99 mg/dL   Comment 1 Notify RN    Comment 2 Document in Chart   SARS Coronavirus 2 (CEPHEID - Performed in [facility] hospital lab), Hosp Order     Status: None   Collection Time: 12/01/18  6:49 PM   Specimen: Nasopharyngeal Swab  Result Value Ref Range   SARS Coronavirus 2 NEGATIVE NEGATIVE    Comment: (NOTE) If result is NEGATIVE SARS-CoV-2 target nucleic acids are NOT DETECTED. The SARS-CoV-2 RNA is generally detectable in upper and lower  respiratory specimens during the acute phase of infection. The lowest  concentration of SARS-CoV-2 viral copies this assay can detect is 250  copies / mL. A negative result does not preclude SARS-CoV-2 infection  and should not be used as the sole basis for treatment or other  patient management decisions.  A negative result may occur with  improper specimen collection / handling, submission of specimen other  than nasopharyngeal swab, presence of viral mutation(s) within the  areas targeted by this assay, and inadequate number of viral copies  (<250 copies / mL). A negative result must be combined with clinical  observations, patient history, and epidemiological information. If result is POSITIVE SARS-CoV-2 target nucleic acids are DETECTED. The SARS-CoV-2 RNA is generally detectable in upper and lower  respiratory specimens dur ing the acute phase of infection.  Positive  results are indicative of active infection with SARS-CoV-2.  Clinical  correlation with patient history and other diagnostic information is  necessary to determine patient infection status.  Positive results do  not rule out bacterial infection or co-infection with other viruses. If result is PRESUMPTIVE POSTIVE SARS-CoV-2 nucleic acids MAY BE PRESENT.   A presumptive positive result was obtained on the submitted specimen  and confirmed on repeat testing.  While 2019 novel  coronavirus  (SARS-CoV-2) nucleic acids may be present in the submitted sample  additional confirmatory testing may be necessary for epidemiological  and / or clinical management purposes  to differentiate between  SARS-CoV-2 and other Sarbecovirus currently known to infect humans.  If clinically indicated additional testing with an alternate test  methodology [phone] is advised. The SARS-CoV-2 RNA is generally  detectable in upper and lower respiratory sp ecimens during the acute  phase of  infection. The expected result is Negative. Fact Sheet for Patients:  StrictlyIdeas.no Fact Sheet for Healthcare Providers: BankingDealers.co.za This test is not yet approved or cleared by the [place] FDA and has been authorized for detection and/or diagnosis of SARS-CoV-2 by FDA under an Emergency Use Authorization (EUA).  This EUA will remain in effect (meaning this test can be used) for the duration of the COVID-19 declaration under Section 564(b)(1) of the Act, 21 U.S.C. section 360bbb-3(b)(1), unless the authorization is terminated or revoked sooner. Performed at [facility], [place] [address] [place], [place] [address]   CBC     Status: Abnormal   Collection Time: 12/01/18  7:00 PM  Result Value Ref Range   WBC 9.2 4.0 - 10.5 K/uL   RBC 3.45 (L) 3.87 - 5.11 MIL/uL   Hemoglobin 10.6 (L) 12.0 - 15.0 g/dL   HCT 32.6 (L) 36.0 - 46.0 %   MCV 94.5 80.0 - 100.0 fL   MCH 30.7 26.0 - 34.0 pg   MCHC 32.5 30.0 - 36.0 g/dL   RDW 13.1 11.5 - 15.5 %   Platelets 237 150 - 400 K/uL   nRBC 0.0 0.0 - 0.2 %    Comment: Performed at [facility], [place] [address], [place], [place] [address]  Ethanol     Status: None   Collection Time: 12/01/18  7:00 PM  Result Value Ref Range   Alcohol, Ethyl (B) <10 <10 mg/dL    Comment: (NOTE) Lowest detectable limit for serum alcohol is 10 mg/dL. For medical purposes only. Performed at [facility], [place] [address] [place], [place] [address]   Protime-INR     Status: None   Collection Time: 12/01/18  7:00 PM  Result Value Ref Range   Prothrombin Time 14.3 11.4 - 15.2 seconds   INR 1.1 0.8 - 1.2    Comment: (NOTE) INR goal varies based on device and disease states. Performed at [facility], [place] [address] [place], [place] [address]   APTT     Status: None   Collection Time: 12/01/18  7:00 PM  Result Value Ref Range   aPTT 25 24 - 36 seconds    Comment: Performed at [place] [address] [place], [place] [address]  Comprehensive metabolic panel     Status: Abnormal   Collection Time: 12/01/18  7:00 PM  Result Value Ref Range   Sodium 137 135 - 145 mmol/L   Potassium 4.6 3.5 - 5.1 mmol/L   Chloride 109 98 - 111 mmol/L   CO2 20 (L) 22 - 32 mmol/L   Glucose, Bld 297 (H) 70 - 99 mg/dL   BUN 31 (H) 8 - 23 mg/dL   Creatinine, Ser 1.69 (H) 0.44 - 1.00 mg/dL   Calcium 8.8 (L) 8.9 - 10.3 mg/dL   Total Protein 6.7 6.5 - 8.1 g/dL   Albumin 3.4 (L) 3.5 - 5.0 g/dL   AST 15 15 - 41 U/L   ALT 13 0 - 44 U/L   Alkaline Phosphatase 111 38 - 126 U/L   Total Bilirubin 0.9 0.3 - 1.2 mg/dL   GFR calc non Af Amer 28 (L) >60 mL/min   GFR calc Af Amer 32 (L) >60 mL/min   Anion gap 8 5 - 15    Comment: Performed at [facility] [address], [place], [place] [address]  Differential     Status: None   Collection Time: 12/01/18  7:00 PM  Result Value Ref Range   Neutrophils Relative % 80 %   Neutro Abs 7.3 1.7 - 7.7 K/uL   Lymphocytes Relative 12 %   Lymphs Abs 1.1 0.7 - 4.0 K/uL   Monocytes Relative 7 %   Monocytes Absolute 0.7 0.1 - 1.0 K/uL   Eosinophils Relative 1 %   Eosinophils Absolute 0.1 0.0 - 0.5 K/uL   Basophils Relative 0 %   Basophils Absolute 0.0 0.0 - 0.1 K/uL    Comment: Performed at [facility], [address], [place], [place] [address]  Urinalysis, Routine w reflex microscopic     Status: Abnormal   Collection Time:  12/01/18 10:00 PM  Result Value Ref Range   Color, Urine YELLOW YELLOW   APPearance HAZY (A) CLEAR   Specific Gravity, Urine 1.011 1.005 - 1.030   pH 5.0 5.0 - 8.0   Glucose, UA 150 (A) NEGATIVE mg/dL   Hgb urine dipstick LARGE (A) NEGATIVE   Bilirubin Urine NEGATIVE NEGATIVE   Ketones, ur NEGATIVE NEGATIVE mg/dL   Protein, ur 026 (A) NEGATIVE mg/dL   Nitrite POSITIVE (A) NEGATIVE   Leukocytes,Ua MODERATE (A) NEGATIVE   RBC / HPF 0-5 0 - 5 RBC/hpf   WBC, UA >50 (H) 0 - 5 WBC/hpf   Bacteria, UA MANY (A) NONE SEEN   Mucus PRESENT     Comment: Performed at [facility], [address], [place], [place] [address]   Dg Chest 1 View  Result Date: 12/01/2018 CLINICAL DATA:  Left-sided weakness and bruising following a fall today. EXAM: CHEST  1 VIEW COMPARISON:  07/15/2018. FINDINGS: No significant change in enlargement of the cardiac silhouette. Interval minimal linear atelectasis or scarring in the left mid lung zone. Otherwise, clear lungs with normal vascularity. No fracture or pneumothorax seen. No pleural fluid. Thoracic spine degenerative changes. IMPRESSION: No acute abnormality. Stable cardiomegaly. Electronically Signed   By: Beckie Salts M.D.   On: 12/01/2018 19:35   Ct Head Wo Contrast  Result Date: 12/01/2018 CLINICAL DATA:  Left-sided weakness. EXAM: CT HEAD WITHOUT CONTRAST TECHNIQUE: Contiguous axial images were obtained from the base of the skull through the vertex without intravenous contrast. COMPARISON:  Head CT 07/15/2018 and MRI 07/16/2018 FINDINGS: Brain: A moderate-sized chronic left MCA infarct is again noted centered in the frontal operculum. Patchy hypodensities are again seen in the cerebral white matter bilaterally, nonspecific but compatible with moderate chronic small vessel ischemic disease. There is the suggestion of new mild asymmetric hypoattenuation of right parietal white matter with questionably diminished gray-white differentiation (for example series 2,  images 18 and 19). No acute intracranial hemorrhage, midline shift, or extra-axial fluid collection is identified. There is mild cerebral atrophy. A 1 cm focus of extra-axial calcification posteriorly over the right frontal convexity is unchanged and may represent a meningioma. Vascular: Calcified atherosclerosis at the skull base. No hyperdense vessel. Skull: No fracture or focal osseous lesion. Sinuses/Orbits: Clear paranasal sinuses. Trace mastoid effusions. Bilateral cataract extraction. Other: None. IMPRESSION: 1. Possible early acute right parietal infarct. 2. No acute intracranial hemorrhage. 3. Chronic ischemia including an old left MCA infarct. Electronically Signed   By: Sebastian Ache M.D.   On: 12/01/2018 20:34   Dg Hip Unilat W Or Wo Pelvis 2-3 Views Left  Result Date: 12/01/2018 CLINICAL DATA:  Left-sided weakness and bruising following a fall today. EXAM: DG HIP (WITH OR WITHOUT PELVIS) 2-3V LEFT COMPARISON:  10/23/2015. FINDINGS: Interval right hip prosthesis. Diffuse osteopenia.  No fracture or dislocation seen. Lower lumbar spine degenerative changes. Extensive arterial calcifications. IMPRESSION: No acute abnormality. Electronically Signed   By: Beckie Salts M.D.   On: 12/01/2018 19:36    Pending Labs Unresulted Labs (From admission, onward)    Start     Ordered   12/01/18 1847  Urine rapid drug screen (hosp performed)not at [facility]  ONCE - STAT,   STAT     12/01/18 1847          Vitals/Pain Today's Vitals   12/01/18 1830 12/01/18 1831 12/01/18 2100 12/01/18 2215  Pulse:   (!) 59 63  Resp:   15 15  Temp:      TempSrc:      SpO2:   95% 95%  Weight:  90.7 kg    Height:  5\' 3"  (1.6 m)    PainSc: 0-No pain       Isolation Precautions No active isolations  Medications Medications  sodium chloride 0.9 % bolus 500 mL (0 mLs Intravenous Stopped 12/01/18 2140)    Followed by  0.9 %  sodium chloride infusion (1,000 mLs Intravenous New Bag/Given 12/01/18 1942)  sodium chloride  flush (NS) 0.9 % injection 3 mL (3 mLs Intravenous Given 12/01/18 1845)    Mobility walks High fall risk   Focused Assessments Neuro Assessment Handoff:  Swallow screen pass? No  Cardiac Rhythm: Atrial fibrillation NIH Stroke Scale ( + Modified Stroke Scale Criteria)  Interval: Initial Level of Consciousness (1a.)   : Alert, keenly responsive LOC Questions (1b. )   +: Answers both questions correctly LOC Commands (1c. )   + : Performs both tasks correctly Best Gaze (2. )  +: Normal Visual (3. )  +: No visual loss Facial Palsy (4. )    : Partial paralysis  Motor Arm, Left (5a. )   +: No drift Motor Arm, Right (5b. )   +: No drift Motor Leg, Left (6a. )   +: Some effort against gravity Motor Leg, Right (6b. )   +: No drift Limb Ataxia (7. ): Present in one limb Sensory (8. )   +: Normal, no sensory loss Best Language (9. )   +: No aphasia Dysarthria (10. ): Mild-to-moderate dysarthria, patient slurs at least some words and, at worst, can be understood with some difficulty Extinction/Inattention (11.)   +: Visual/tactile/auditory/spatial/personal inattention Modified SS Total  +: 3 Complete NIHSS TOTAL: 7     Neuro Assessment: Exceptions to WDL Neuro Checks:   Initial (12/01/18 1855)  Last Documented NIHSS Modified Score: 3 (12/01/18 1855) Has TPA been given? No If patient is a Neuro Trauma and patient is going to OR before floor call report to 4N Charge nurse: [phone] or [phone]     R Recommendations: See Admitting Provider Note  Report given to:   Additional Notes: n/a

## 2018-12-01 NOTE — H&P (Signed)
Date: 12/01/2018               Patient Name:  [name] [name] MRN: [id number]  DOB: 1934/10/02 Age / Sex: 84 y.o., female   PCP: Patient, No Pcp Per         Medical Service: Internal Medicine Teaching Service         Attending Physician: Dr. Inez Catalina, MD    First Contact: Dr. Andris Baumann Pager: [phone]  Second Contact: Dr. Ginette Otto Pager: [phone]       After Hours (After 5p/  First Contact Pager: [phone]  weekends / holidays): Second Contact Pager: [phone]   Chief Complaint: weakness  History of Present Illness: [name] is a 84 year old female with a significant PMH of a fib, diabetes, CKD stage III, diastolic CHF, CAD, and left MCA stroke who presented to the ED after having a fall and feeling weak. Pt states that she remembers walking with her walker before she suddenly fell. It is challenging to get a history with patient's dysarthria, and she states her voice does not sound the same and is slurred now. Pt describes a years long history of dizziness and associated lightheadedness when turning her head to the R, and says she has a history of prior falls like this. This evening, she denies any other associated symptoms other than bruising on her arm from today's fall. She remembers calling her son who then called EMS due to concerns of unresponsiveness and left sided weakness. On arrival, EMS noted the pt to have generalized weakness and her blood glucose was 352. Additionally, she has been having diarrhea for a week and complaining of headache and fatigue.  Of note, pt had a left MCA stroke approximately 1 year ago for which she was prescribed Elliquis 5mg  once a day and discharged to a rehab "for a long while." She states that she ran out of her Elliquis prescription about a month ago and it was too expensive, so she has been taking it.  In the ED, pt was afebrile, bradycardic in the 50s, mildly hypertensive to the 130s/60s, and breathing on room air. Lab work showed  Na 137, K 4.6, Cr 1.69, BUN 21, glucose 276, and Hgb 10.6. PT/INR and PTT were all normal. UA showed glucose 150, large Hgb, protein 100, positive nitrites, moderate leukocytes, >50 WBCs, and many bacteria. CT head revealed possible early acute right parietal infarct, no acute intracranial hemorrhage, and chronic ischemia including an old left MCA infarct. It was initially difficult to determine her last known normal time. However, pt's daughter then stated the symptoms began around 11am putting her outside of the window for TPA.   Meds: No current facility-administered medications on file prior to encounter.    Current Outpatient Medications on File Prior to Encounter  Medication Sig Dispense Refill  . amiodarone (PACERONE) 200 MG tablet Take 1 tablet (200 mg total) by mouth daily. 30 tablet 3  . aspirin EC 81 MG tablet Take 81 mg by mouth daily.    atorvastatin (LIPITOR) 20 MG tablet Take 1 tablet (20 mg total) by mouth daily. (Patient taking differently: Take 20 mg by mouth daily at 6 PM. ) 90 tablet 3  . ferrous sulfate 325 (65 FE) MG tablet Take 325 mg by mouth daily with breakfast.    . furosemide (LASIX) 40 MG tablet Take 40 mg by mouth daily.    Marland Kitchen levothyroxine (SYNTHROID, LEVOTHROID) 150 MCG tablet Take 1  tablet (150 mcg total) by mouth daily before breakfast. 90 tablet 1  . lisinopril (ZESTRIL) 10 MG tablet Take 10 mg by mouth daily.    . meclizine (ANTIVERT) 12.5 MG tablet Take 12.5 mg by mouth 2 (two) times daily as needed for dizziness.    . metFORMIN (GLUCOPHAGE) 500 MG tablet Take 500 mg by mouth 2 (two) times daily with a meal.    . metoprolol succinate (TOPROL-XL) 25 MG 24 hr tablet Take 25 mg by mouth daily.    Marland Kitchen omeprazole (PRILOSEC) 20 MG capsule Take 1 capsule (20 mg total) by mouth 2 (two) times daily. (Patient taking differently: Take 20 mg by mouth daily. ) 180 capsule 3  . potassium chloride SA (K-DUR,KLOR-CON) 20 MEQ tablet Take 2 tablets (40 mEq total) by mouth 2 (two)  times daily. (Patient taking differently: Take 20 mEq by mouth 2 (two) times daily. ) 30 tablet 0  . sertraline (ZOLOFT) 50 MG tablet Take 1 tablet (50 mg total) by mouth daily. 90 tablet 0  . albuterol (PROVENTIL HFA;VENTOLIN HFA) 108 (90 Base) MCG/ACT inhaler Inhale 2 puffs into the lungs every 6 (six) hours as needed for wheezing or shortness of breath. 1 Inhaler 0  . Amino Acids-Protein Hydrolys (FEEDING SUPPLEMENT, PRO-STAT SUGAR FREE 64,) LIQD Take 30 mLs by mouth 3 (three) times daily. 900 mL 0  . amLODipine (NORVASC) 10 MG tablet Take 1 tablet (10 mg total) by mouth daily. 30 tablet 5  . apixaban (ELIQUIS) 5 MG TABS tablet Take 1 tablet (5 mg total) by mouth 2 (two) times daily. 60 tablet 3  . feeding supplement (BOOST / RESOURCE BREEZE) LIQD Take 1 Container by mouth 3 (three) times daily with meals. 12 Container 0  . furosemide (LASIX) 80 MG tablet Take 1 tablet (80 mg total) by mouth daily. 30 tablet 0  . insulin glargine (LANTUS) 100 UNIT/ML injection Inject 0.18 mLs (18 Units total) into the skin daily. 10 mL 11  . Lactobacillus (ACIDOPHILUS PO) Take 1 tablet by mouth daily. 30 day course following joint replacement surgery    . latanoprost (XALATAN) 0.005 % ophthalmic solution Place 1 drop into both eyes at bedtime.     . nitroGLYCERIN (NITROSTAT) 0.4 MG SL tablet Place 0.4 mg under the tongue every 5 (five) minutes as needed for chest pain.    . OXYGEN Inhale 2 L into the lungs continuous.    Daryll Brod INSULIN SYR 0.5ML/31G 31G X 5/16" 0.5 ML MISC      Allergies: Allergies as of 12/01/2018 - Review Complete 12/01/2018  Allergen Reaction Noted  . Oxycodone Nausea And Vomiting 02/04/2012   Past Medical History:  Diagnosis Date  . Asthma   . CAD (coronary artery disease)    a. 2000 s/p PCI RCA;  b. 2005 PCI of RCA 2/2 ISR; c. 04/2013 Neg MV;  d. 05/2013 Cath: LM 20, LAD 30p, LCX 20p, OM1 90 small, RCA 77m (PTCA - FFR 0.86), PDA 40.  Marland Kitchen Cerebral infarction ([place])   . Chronic  diastolic CHF (congestive heart failure) ([place])    a. 10/2013 Echo: EF 60-65%, no rwma, Gr1 DD, mild MR, mildly dil LA.  . CKD (chronic kidney disease), stage III ([place])   . DM (diabetes mellitus) ([place])   . Dyslipidemia   . GERD (gastroesophageal reflux disease)   . Hypertensive heart disease   . Hypothyroidism    hx  . Obesity   . Osteoarthritis   . PAF (paroxysmal atrial fibrillation) ([place])  a. s/p dccv 04/2014;  b. amio/eliquis;  c. 06/2013 bb/ccb d/c 2/2 symptomatic bradycardia; d. 04/2015 recurrent AF->amio increased/bb resumed.  . Rheumatoid arthritis(714.0)   . Right rib fracture    a. 04/2015.  Marland Kitchen. Shoulder fracture, left    a. Dr. Martha ClanKrasinski   Family History: Mother with esophageal cancer and diabetes.  Social History: Denies tobacco, alcohol, or drug use.  Review of Systems: Review of Systems  Constitutional: Negative for chills and fever.  HENT: Negative for hearing loss.   Eyes: Negative for blurred vision and double vision.  Respiratory: Negative for shortness of breath.   Cardiovascular: Negative for chest pain.  Gastrointestinal: Positive for abdominal pain.  Musculoskeletal:       R hip soreness after fall  Neurological: Positive for dizziness, speech change and weakness.  Endo/Heme/Allergies:       Bruising from falls.    Physical Exam: Blood pressure (!) 130/58, pulse (!) 48, temperature 98.1 F (36.7 C), temperature source Oral, resp. rate (!) 25, height 5\' 3"  (1.6 m), weight 90.7 kg, SpO2 94 %. Physical Exam Vitals signs reviewed.  Constitutional:      General: She is not in acute distress. HENT:     Head: Normocephalic and atraumatic.     Mouth/Throat:     Mouth: Mucous membranes are moist.  Eyes:     Extraocular Movements:     Right eye: No nystagmus.     Comments: Conjugate gaze. Neglects following finger to the pt's L side.  Cardiovascular:     Rate and Rhythm: Normal rate and regular rhythm.     Pulses:          Dorsalis pedis pulses are 2+  on the right side and 2+ on the left side.     Heart sounds: Normal heart sounds. No murmur. No friction rub. No gallop.   Pulmonary:     Effort: Pulmonary effort is normal.     Breath sounds: No wheezing, rhonchi or rales.  Abdominal:     General: Bowel sounds are normal. There is no distension.     Palpations: Abdomen is soft.     Tenderness: There is no abdominal tenderness.  Musculoskeletal:     Right lower leg: No edema.     Left lower leg: No edema.  Skin:    General: Skin is warm and dry.  Neurological:     General: No focal deficit present.     Mental Status: She is alert and oriented to person, place, and time.     Cranial Nerves: Dysarthria present. No facial asymmetry.     Comments: Able to follow simple commands.  Shoulder shrug weaker on L compared to R Can lift all four extremities against gravity. Neglects finger squeeze on L side. Pt can't discern if sensation is equal bilaterally.  Psychiatric:        Mood and Affect: Mood normal.    EKG: personally reviewed my interpretation is in atrial fibrillation, poor quality data.  CXR: personally reviewed my interpretation is cardiomegaly present. No pneumothorax, pleural effusion, or pulmonary edema. No bony abnormalities.   Assessment & Plan by Problem: Ms. Yetta BarreJones is a [age] year old female with a significant PMH of a fib, diabetes, CKD stage III, diastolic CHF, CAD, and left MCA stroke who presented for weakness after having a fall and was found to have a probable R parietal infarct.  Active Problems: CVA (cerebral vascular accident)  R parietal infarct correlates with pt's exam findings of left hemi-neglect  and most likely due to pt's a fib and being off anticoagulation for the past month. Neuro consulted, appreciate recommendations Stroke workup -MRI brain w/o contrast -echo -bilateral carotid ultrasound -A1c -lipid panel -neuro checks q2 for 12hrs, then q4  Pt failed bedside nursing swallow eval -will remain  NPO till cleared by speech -holding oral home meds while NPO   AKI - most likely due to dehydration with 1 week history of diarrhea pt received 500mL NS bolus in the ED Cr 1.69 on admission, baseline was 0.8 in 2017 -on IVF, 2250mL/hr NS -monitor fluid intake with history of CHF -recheck CMP in AM   History of Falls -PT/OT eval and treat -fall precautions   Diabetes -holding insulin while NPO   Hypothyroidism -ordered levothyroxine IV 100mcg   Diet - NPO Fluids - 5350mL/hr NS DVT ppx- enoxaparin 30mg  subQ daily CODE STATUS - FULL CODE   Dispo: Admit patient to Inpatient with expected length of stay greater than 2 midnights.  Signed: Thom ChimesJones, Liona Wengert, MD 12/01/2018, 11:07 PM  Pager: [phone]

## 2018-12-01 NOTE — ED Notes (Signed)
Discussed VAN screning with Dr. Acquanetta Belling

## 2018-12-01 NOTE — ED Triage Notes (Addendum)
EMS initially called to unresponsive then stroke symptoms of left sided weakness. On scene EMS finds pt that is generalized weakness. Pt had hx of diarrhea x 1 week . C/O diarrhea, headache and fatique. CBG 276 on arrival. EMS states CBG 352 on scene and given 800 cc ns in route. Bruises on left arm from previous fall. Hx of AFIB and stroke.

## 2018-12-02 ENCOUNTER — Inpatient Hospital Stay (HOSPITAL_COMMUNITY): Payer: Medicare HMO

## 2018-12-02 ENCOUNTER — Encounter (HOSPITAL_COMMUNITY): Payer: Self-pay | Admitting: *Deleted

## 2018-12-02 DIAGNOSIS — G8194 Hemiplegia, unspecified affecting left nondominant side: Secondary | ICD-10-CM

## 2018-12-02 DIAGNOSIS — E039 Hypothyroidism, unspecified: Secondary | ICD-10-CM

## 2018-12-02 DIAGNOSIS — Z794 Long term (current) use of insulin: Secondary | ICD-10-CM

## 2018-12-02 DIAGNOSIS — Z8673 Personal history of transient ischemic attack (TIA), and cerebral infarction without residual deficits: Secondary | ICD-10-CM

## 2018-12-02 DIAGNOSIS — I5032 Chronic diastolic (congestive) heart failure: Secondary | ICD-10-CM

## 2018-12-02 DIAGNOSIS — Z885 Allergy status to narcotic agent status: Secondary | ICD-10-CM

## 2018-12-02 DIAGNOSIS — I2510 Atherosclerotic heart disease of native coronary artery without angina pectoris: Secondary | ICD-10-CM

## 2018-12-02 DIAGNOSIS — E1159 Type 2 diabetes mellitus with other circulatory complications: Secondary | ICD-10-CM

## 2018-12-02 DIAGNOSIS — I480 Paroxysmal atrial fibrillation: Secondary | ICD-10-CM

## 2018-12-02 DIAGNOSIS — N289 Disorder of kidney and ureter, unspecified: Secondary | ICD-10-CM

## 2018-12-02 DIAGNOSIS — I361 Nonrheumatic tricuspid (valve) insufficiency: Secondary | ICD-10-CM

## 2018-12-02 DIAGNOSIS — E860 Dehydration: Secondary | ICD-10-CM

## 2018-12-02 DIAGNOSIS — E785 Hyperlipidemia, unspecified: Secondary | ICD-10-CM

## 2018-12-02 DIAGNOSIS — I130 Hypertensive heart and chronic kidney disease with heart failure and stage 1 through stage 4 chronic kidney disease, or unspecified chronic kidney disease: Secondary | ICD-10-CM

## 2018-12-02 DIAGNOSIS — Z7982 Long term (current) use of aspirin: Secondary | ICD-10-CM

## 2018-12-02 DIAGNOSIS — R4702 Dysphasia: Secondary | ICD-10-CM

## 2018-12-02 DIAGNOSIS — Z91120 Patient's intentional underdosing of medication regimen due to financial hardship: Secondary | ICD-10-CM

## 2018-12-02 DIAGNOSIS — I10 Essential (primary) hypertension: Secondary | ICD-10-CM

## 2018-12-02 DIAGNOSIS — Z9181 History of falling: Secondary | ICD-10-CM

## 2018-12-02 DIAGNOSIS — E1122 Type 2 diabetes mellitus with diabetic chronic kidney disease: Secondary | ICD-10-CM

## 2018-12-02 DIAGNOSIS — N183 Chronic kidney disease, stage 3 (moderate): Secondary | ICD-10-CM

## 2018-12-02 DIAGNOSIS — I6381 Other cerebral infarction due to occlusion or stenosis of small artery: Secondary | ICD-10-CM

## 2018-12-02 DIAGNOSIS — N189 Chronic kidney disease, unspecified: Secondary | ICD-10-CM

## 2018-12-02 DIAGNOSIS — Z79890 Hormone replacement therapy (postmenopausal): Secondary | ICD-10-CM

## 2018-12-02 DIAGNOSIS — Z79899 Other long term (current) drug therapy: Secondary | ICD-10-CM

## 2018-12-02 DIAGNOSIS — I63411 Cerebral infarction due to embolism of right middle cerebral artery: Secondary | ICD-10-CM

## 2018-12-02 DIAGNOSIS — R471 Dysarthria and anarthria: Secondary | ICD-10-CM

## 2018-12-02 DIAGNOSIS — R739 Hyperglycemia, unspecified: Secondary | ICD-10-CM | POA: Diagnosis present

## 2018-12-02 LAB — GLUCOSE, CAPILLARY
Glucose-Capillary: 189 mg/dL — ABNORMAL HIGH (ref 70–99)
Glucose-Capillary: 210 mg/dL — ABNORMAL HIGH (ref 70–99)
Glucose-Capillary: 213 mg/dL — ABNORMAL HIGH (ref 70–99)
Glucose-Capillary: 236 mg/dL — ABNORMAL HIGH (ref 70–99)

## 2018-12-02 LAB — LIPID PANEL
Cholesterol: 145 mg/dL (ref 0–200)
HDL: 46 mg/dL (ref >40)
LDL Cholesterol: 78 mg/dL (ref 0–99)
Total CHOL/HDL Ratio: 3.2 RATIO
Triglycerides: 105 mg/dL (ref <150)
VLDL: 21 mg/dL (ref 0–40)

## 2018-12-02 LAB — BASIC METABOLIC PANEL
Anion gap: 9 (ref 5–15)
BUN: 25 mg/dL — ABNORMAL HIGH (ref 8–23)
CO2: 18 mmol/L — ABNORMAL LOW (ref 22–32)
Calcium: 8.9 mg/dL (ref 8.9–10.3)
Chloride: 113 mmol/L — ABNORMAL HIGH (ref 98–111)
Creatinine, Ser: 1.43 mg/dL — ABNORMAL HIGH (ref 0.44–1.00)
GFR calc Af Amer: 40 mL/min — ABNORMAL LOW (ref >60)
GFR calc non Af Amer: 34 mL/min — ABNORMAL LOW (ref >60)
Glucose, Bld: 259 mg/dL — ABNORMAL HIGH (ref 70–99)
Potassium: 4.3 mmol/L (ref 3.5–5.1)
Sodium: 140 mmol/L (ref 135–145)

## 2018-12-02 LAB — ECHOCARDIOGRAM COMPLETE
Height: 63 in
Weight: 3104.08 oz

## 2018-12-02 LAB — HEMOGLOBIN A1C
Hgb A1c MFr Bld: 9.2 % — ABNORMAL HIGH (ref 4.8–5.6)
Mean Plasma Glucose: 217.34 mg/dL

## 2018-12-02 MED ORDER — SODIUM CHLORIDE 0.9 % IV SOLN
INTRAVENOUS | Status: AC
  Administered 2018-12-02 (×2): via INTRAVENOUS

## 2018-12-02 MED ORDER — APIXABAN 5 MG PO TABS: 5.0000 mg | ORAL_TABLET | Freq: Two times a day (BID) | ORAL | Status: DC

## 2018-12-02 MED ORDER — AMIODARONE HCL 200 MG PO TABS
200.0000 mg | ORAL_TABLET | Freq: Every day | ORAL | Status: DC
  Administered 2018-12-03 – 2018-12-07 (×5): 200 mg
  Filled 2018-12-02 (×6): qty 1

## 2018-12-02 MED ORDER — JEVITY 1.2 CAL PO LIQD
1000.0000 mL | ORAL | Status: DC
  Administered 2018-12-02: 1000 mL
  Filled 2018-12-02 (×2): qty 1000

## 2018-12-02 MED ORDER — ASPIRIN 325 MG PO TABS
325.0000 mg | ORAL_TABLET | Freq: Every day | ORAL | Status: DC
  Administered 2018-12-03 – 2018-12-10 (×7): 325 mg via ORAL
  Filled 2018-12-02 (×7): qty 1

## 2018-12-02 MED ORDER — IOHEXOL 300 MG/ML  SOLN
50.0000 mL | Freq: Once | INTRAMUSCULAR | Status: AC | PRN
  Administered 2018-12-02: 20 mL

## 2018-12-02 MED ORDER — LIDOCAINE VISCOUS HCL 2 % MT SOLN
OROMUCOSAL | Status: AC
  Administered 2018-12-02: 4 mL via OROMUCOSAL
  Filled 2018-12-02: qty 15

## 2018-12-02 MED ORDER — AMIODARONE HCL 200 MG PO TABS
200.0000 mg | ORAL_TABLET | Freq: Every day | ORAL | Status: DC
  Administered 2018-12-02: 200 mg via ORAL

## 2018-12-02 MED ORDER — ATORVASTATIN CALCIUM 40 MG PO TABS: 40.0000 mg | ORAL_TABLET | Freq: Every day | ORAL | Status: DC

## 2018-12-02 MED ORDER — ENOXAPARIN SODIUM 30 MG/0.3ML ~~LOC~~ SOLN
30.0000 mg | Freq: Once | SUBCUTANEOUS | Status: AC
  Administered 2018-12-02: 30 mg via SUBCUTANEOUS
  Filled 2018-12-02: qty 0.3

## 2018-12-02 MED ORDER — INSULIN ASPART 100 UNIT/ML ~~LOC~~ SOLN
0.0000 [IU] | SUBCUTANEOUS | Status: DC
  Administered 2018-12-02 (×2): 3 [IU] via SUBCUTANEOUS
  Administered 2018-12-02 (×2): 5 [IU] via SUBCUTANEOUS
  Administered 2018-12-03: 3 [IU] via SUBCUTANEOUS
  Administered 2018-12-03 (×4): 5 [IU] via SUBCUTANEOUS
  Administered 2018-12-03 – 2018-12-04 (×2): 3 [IU] via SUBCUTANEOUS
  Administered 2018-12-04 (×3): 8 [IU] via SUBCUTANEOUS
  Administered 2018-12-04: 11 [IU] via SUBCUTANEOUS
  Administered 2018-12-05: 5 [IU] via SUBCUTANEOUS
  Administered 2018-12-05: 8 [IU] via SUBCUTANEOUS
  Administered 2018-12-05: 3 [IU] via SUBCUTANEOUS
  Administered 2018-12-05: 8 [IU] via SUBCUTANEOUS
  Administered 2018-12-05: 2 [IU] via SUBCUTANEOUS
  Administered 2018-12-05 (×2): 8 [IU] via SUBCUTANEOUS
  Administered 2018-12-06: 5 [IU] via SUBCUTANEOUS
  Administered 2018-12-06: 3 [IU] via SUBCUTANEOUS
  Administered 2018-12-06: 11 [IU] via SUBCUTANEOUS
  Administered 2018-12-07: 8 [IU] via SUBCUTANEOUS
  Administered 2018-12-07: 5 [IU] via SUBCUTANEOUS
  Administered 2018-12-07: 3 [IU] via SUBCUTANEOUS
  Administered 2018-12-07: 5 [IU] via SUBCUTANEOUS

## 2018-12-02 MED ORDER — LEVOTHYROXINE SODIUM 100 MCG/5ML IV SOLN
100.0000 ug | Freq: Every day | INTRAVENOUS | Status: DC
  Administered 2018-12-02 – 2018-12-03 (×2): 100 ug via INTRAVENOUS
  Filled 2018-12-02 (×2): qty 5

## 2018-12-02 MED ORDER — ASPIRIN 300 MG RE SUPP
300.0000 mg | Freq: Every day | RECTAL | Status: DC
  Administered 2018-12-04: 300 mg via RECTAL
  Filled 2018-12-02: qty 1

## 2018-12-02 MED ORDER — INSULIN GLARGINE 100 UNIT/ML ~~LOC~~ SOLN
10.0000 [IU] | Freq: Every day | SUBCUTANEOUS | Status: DC
  Filled 2018-12-02: qty 0.1

## 2018-12-02 MED ORDER — LIDOCAINE VISCOUS HCL 2 % MT SOLN
15.0000 mL | Freq: Once | OROMUCOSAL | Status: AC
  Administered 2018-12-02: 4 mL via OROMUCOSAL
  Filled 2018-12-02: qty 15

## 2018-12-02 NOTE — Progress Notes (Addendum)
Subjective:   Patient seen at bedside. She is alert and oriented, but continues to have significant dysarthria. She states that she has had trouble taking her Eliquis consistently and stop for a period of time about a month ago, but it is unclear how long due to her dysarthria. We discussed the need for a feeding tube for temporary nutrition while she recovers. She states she is willing to have tube feeds. She states her mouth is dry and asks for water, we reminded her we were worried about her risk for aspiration, but she should get oral care from the nursing staff. She has no specific questions at this time.  Objective:  Vital signs in last 24 hours: Vitals:   12/02/18 0400 12/02/18 0600 12/02/18 0800 12/02/18 1206  BP: (!) 108/96 129/85 (!) 198/77 (!) 108/96  Pulse: 66 72 63 88  Resp: 20 18 18 18   Temp: 98.9 F (37.2 C) 98.8 F (37.1 C) 97.8 F (36.6 C) 98 F (36.7 C)  TempSrc: Oral Oral Oral Oral  SpO2: 94% 92% 100% 98%  Weight:      Height:       Physical Exam Vitals signs reviewed.  Constitutional:      General: She is not in acute distress. HENT:     Head: Normocephalic and atraumatic.     Mouth/Throat:     Mouth: Mucous membranes are moist.  Cardiovascular:     Rate and Rhythm: Normal rate. Rhythm irregular.     Pulses:          Dorsalis pedis pulses are 2+ on the right side and 2+ on the left side.     Heart sounds: Normal heart sounds. No murmur. No friction rub. No gallop.   Pulmonary:     Effort: Pulmonary effort is normal.     Breath sounds: No wheezing, rhonchi or rales.  Abdominal:     General: Bowel sounds are normal. There is no distension.     Palpations: Abdomen is soft.     Tenderness: There is no abdominal tenderness.  Musculoskeletal:     Right lower leg: No edema.     Left lower leg: No edema.  Skin:    General: Skin is warm and dry.  Neurological:     General: No focal deficit present.     Mental Status: She is alert and oriented to person,  place, and time.     Cranial Nerves: Dysarthria present. No facial asymmetry.     Comments: A and O x3 Able to follow simple commands.  Left sided neglect, possible L hemianopsia Shoulder shrug weaker on L compared to R Can lift all four extremities against gravity. All four extremities with decreased effort. LUE 3/5 Sensation intact Bilat UE and LE  Psychiatric:        Mood and Affect: Mood normal.    Assessment/Plan:  Active Problems:   CVA (cerebral vascular accident) (HCC)  84 year old female with a significant PMH of a fib, diabetes, CKD stage III, diastolic CHF, CAD, and left MCA stroke who presented for weakness after having a fall and was found to have a probable R parietal infarct.  Active Problems: CVA (cerebral vascular accident)  R parietal infarct correlates with pt's exam findings of left hemi-neglect and most likely due to pt's a fib and being off anticoagulation for the past month. - Appreciate Neurology's recommendations - Right MCA infarct on MRI with petechial hemorrhage, will not pursue treatment dose anticoagulation for afib -  SLP: NPO > Patient agrees start tube feeds - PT/OT Evals Pending - Echo pending - Carotid Dopplers Pending - A1c Elevated to 9.2 - Lipid panel WNL - Neuro checks  AKI: Likely 2/2 dehydration given 1 week of diarrhea.  Cr.1.69 on admit (baseline 0.8, 2017) - Improving, Cr 1.43 on 7/15 - IVF, 38mL/hr NS - I/Os, Daily Wts (Hx CHF) - AM CMP  Afib: NPO, holding home meds. No treatment dose anticoagulation due petechiae on MRI  Diabetes:SSI-M q4h History of Falls: PT/OT, Fall Precautions Hypothyroidism:  levothyroxine IV 117mcg  Diet - NPO, Tube Feeds when tube placed Fluids - 67mL/hr NS DVT ppx- enoxaparin 30mg  subQ daily CODE STATUS - FULL CODE  Dispo: Anticipated discharge in approximately 2-3 day(s).  - Attempted to update daughter, but cell # was not in service and no answer at home phone  Neva Seat, MD  12/02/2018, 1:44 PM

## 2018-12-02 NOTE — Evaluation (Signed)
Speech Language Pathology Evaluation Patient Details Name: [name] [name] MRN: [id number] DOB: Jul 24, 1934 Today's Date: 12/02/2018 Time: 0725-0750 SLP Time Calculation (min) (ACUTE ONLY): 25 min  Problem List:  Patient Active Problem List   Diagnosis Date Noted  . CVA (cerebral vascular accident) (HCC) 12/01/2018  . Stage 2 Pressure ulcer, sacrum 11/25/2015  . 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 Foreign body of hip with infection   . Essential hypertension   . Pulmonary hypertension (HCC)   . Acute renal failure (HCC)   . Uncontrolled type 2 diabetes mellitus with complication (HCC)   . Postoperative wound infection of right hip   . MSSA infection, non-invasive   . Infection caused by Enterobacter cloacae   . Paroxysmal atrial fibrillation (HCC)   . Acute blood loss anemia   . Neurogenic bladder   . Sinus bradycardia 11/20/2015  . HCAP (healthcare-associated pneumonia)   . Acute renal failure (ARF) (HCC)   . Pressure ulcer 11/17/2015  . Sepsis (HCC) 11/16/2015  . AKI (acute kidney injury) (HCC)   . Metabolic acidosis   . Hypocalcemia   . Acute respiratory failure with hypoxia (HCC)   . Hypertensive heart disease   . Acute on chronic diastolic CHF (congestive heart failure) (HCC)   . Coronary artery disease involving native coronary artery of native heart without angina pectoris   . Persistent atrial fibrillation   . Dyspnea 07/18/2015  . Follicular acne 07/07/2015  . Acute diastolic heart failure (HCC) 06/16/2015  . Routine history and physical examination of adult 01/13/2015  . Dermatitis 06/27/2014  . CKD (chronic kidney disease), stage III (HCC)   . Bradycardia 06/24/2014  . Diabetes mellitus type 2, uncontrolled (HCC) 03/25/2014  . Depression 03/25/2014  . Medicare annual wellness visit, subsequent 01/10/2014  . Anxiety state 01/10/2014  . Carotid stenosis 11/16/2013  . CVA (cerebral infarction) 10/21/2013  . Screening for breast cancer  01/14/2013  . Poorly controlled type 2 diabetes mellitus with circulatory disorder (HCC) 02/04/2012  . Chronic UTI 06/12/2011  . GERD (gastroesophageal reflux disease) 06/12/2011  . Hyperlipidemia 06/12/2011  . Hypertension 05/17/2011  . Hypothyroidism 03/07/2011  . Chronic back pain 02/21/2011  . Morbid obesity (HCC) 08/17/2008  . CAD (coronary artery disease) 08/17/2008  . Atrial fibrillation (HCC) 08/17/2008  . Osteoarthritis, knee 08/17/2008   Past Medical History:  Past Medical History:  Diagnosis Date  . Asthma   . CAD (coronary artery disease)    a. 2000 s/p PCI RCA;  b. 2005 PCI of RCA 2/2 ISR; c. 04/2013 Neg MV;  d. 05/2013 Cath: LM 20, LAD 30p, LCX 20p, OM1 90 small, RCA 89m (PTCA - FFR 0.86), PDA 40.  Marland Kitchen Cerebral infarction (HCC)   . Chronic diastolic CHF (congestive heart failure) (HCC)    a. 10/2013 Echo: EF 60-65%, no rwma, Gr1 DD, mild MR, mildly dil LA.  . CKD (chronic kidney disease), stage III (HCC)   . DM (diabetes mellitus) (HCC)   . Dyslipidemia   . GERD (gastroesophageal reflux disease)   . Hypertensive heart disease   . Hypothyroidism    hx  . Obesity   . Osteoarthritis   . PAF (paroxysmal atrial fibrillation) (HCC)    a. s/p dccv 04/2014;  b. amio/eliquis;  c. 06/2013 bb/ccb d/c 2/2 symptomatic bradycardia; d. 04/2015 recurrent AF->amio increased/bb resumed.  . Rheumatoid arthritis(714.0)   . Right rib fracture    a. 04/2015.  Marland Kitchen Shoulder fracture, left    a. Dr. Martha Clan  Past Surgical History:  Past Surgical History:  Procedure Laterality Date  . CARDIAC CATHETERIZATION  06/11/2013  . cataract surgery    . CORONARY ANGIOPLASTY    . ELECTROPHYSIOLOGIC STUDY N/A 07/25/2015   Procedure: CARDIOVERSION;  Surgeon: Minna Merritts, MD;  Location: [facility] ORS;  Service: Cardiovascular;  Laterality: N/A;  . partial hysterectomy    . RCA stent placement  2000  . REPLACEMENT TOTAL KNEE  2013   right  . restenosis with PTCA placement  2005  . SHOULDER SURGERY   02/04/13   HPI:  84 yo female adm to [facility]transfer* with left sided weakness. Large area of acute right MCA territory infarct centered at the posterior insula and posterior frontal lobe. There is petechial hemorrhage superimposed. Large remote left MCA territory infarct affecting insula and lateral frontal lobe as well. Remote lacunar infarct in the left thalamus and right pons.  Pt did not pass [facility] swallow screen and thus SLP eval ordered.Pt also with h/o HTN, dyspnea, pna, GERD, UTI, bradycardia, renal fx.  Pt reports she lives alone in a trailer with a bird.   Assessment / Plan / Recommendation Clinical Impression  Patient presents with severe mixed dysarthria resulting in severely impaired intelligiblity with decreased phonatory strength and decreased lingual strength/ROM.  In addition pt also with decreased sustained attention to tasks and she tends to look to the right.  She will benefit from skilled SLP to maxmize cognitive linguisitic abilities to decrease caregiver burden.    SLP Assessment  SLP Recommendation/Assessment: Patient needs continued Speech Lanaguage Pathology Services SLP Visit Diagnosis: Dysarthria and anarthria (R47.1);Attention and concentration deficit Attention and concentration deficit following: Cerebral infarction    Follow Up Recommendations  Skilled Nursing facility    Frequency and Duration min 2x/week  2 weeks      SLP Evaluation Cognition  Overall Cognitive Status: Impaired/Different from baseline Arousal/Alertness: Awake/alert Orientation Level: Oriented to person;Disoriented to situation;Disoriented to time;Disoriented to place Attention: Sustained Sustained Attention: Impaired Sustained Attention Impairment: Functional basic Awareness: Impaired Awareness Impairment: Intellectual impairment       Comprehension  Auditory Comprehension Overall Auditory Comprehension: Impaired Yes/No Questions: Not tested Commands: (for basic  commands) Interfering Components: Attention;Working Field seismologist: Tour manager: Not tested Reading Comprehension Reading Status: Not tested    Expression Expression Primary Mode of Expression: Verbal Verbal Expression Overall Verbal Expression: Appears within functional limits for tasks assessed Initiation: No impairment Naming: Not tested Interfering Components: Attention;Speech intelligibility Written Expression Dominant Hand: Right Written Expression: Not tested   Oral / Motor  Oral Motor/Sensory Function Overall Oral Motor/Sensory Function: Moderate impairment Facial ROM: Reduced left Facial Symmetry: Abnormal symmetry left Facial Strength: Reduced left Facial Sensation: Reduced left Lingual ROM: Reduced left Lingual Symmetry: Abnormal symmetry left Lingual Strength: Suspected CN XII (hypoglossal) dysfunction Velum: (sluggish but appeared bilateral) Mandible: (dnt) Motor Speech Overall Motor Speech: Impaired Respiration: Impaired Level of Impairment: Word Phonation: Low vocal intensity Articulation: Impaired Level of Impairment: Word Intelligibility: Intelligibility reduced Word: 75-100% accurate Phrase: 50-74% accurate Sentence: 25-49% accurate Conversation: 0-24% accurate Motor Planning: Witnin functional limits Effective Techniques: Slow rate;Increased vocal intensity   GO                    Macario Golds 12/02/2018, 8:57 AM  Luanna Salk, MS [facility] SLP [place] Pager [phone] Office [phone]

## 2018-12-02 NOTE — Progress Notes (Signed)
Echocardiogram 2D Echocardiogram has been performed.  [name] [name] 12/02/2018, 2:12 PM

## 2018-12-02 NOTE — Evaluation (Signed)
Physical Therapy Evaluation Patient Details Name: [name] [name] MRN: [id number] DOB: 1935-02-19 Today's Date: 12/02/2018   History of Present Illness  Pt is an 84 y/o female who presents with stroke-like symptoms. MRI revealed large, acute R MCA infarct with petechial hemorrhage, as well as remote L MCA infarct and remote lacunar infarcts in L thalamus and R pons. PMH significant for L shoulder fracture s/p surgery 2014, RA, PAF, OA, hypothyroidism, DM, CKD, CHF, cerebral infarction, CAD, R TKR.  Clinical Impression  Pt admitted with above diagnosis. Pt currently with functional limitations due to the deficits listed below (see PT Problem List). At the time of PT eval pt was dyspneic and with labored breathing (3/4 on dyspnea scale). On RA O2 sats at 84%. RN notified and pt was put on 2L/min supplemental O2 with improvement to 87%. Pt able to achieve 90% with cues for pursed-lip breathing and increase to 3L/min supplemental O2. Pt very fatigued and OOB mobility was not attempted this session. We were able to observe active movement of BUE's, and minimal active movement of LE's - pt resisting when therapist attempting knee flexion. Pt was apparently functioning at a mod I level at home with RW and intermittent assist from son for errands. Would like to pursue CIR at d/c however based on performance today not sure pt could tolerate the increased intensity of rehab required for a CIR stay. Will continue to assess for most appropriate d/c disposition. Acutely, pt will benefit from skilled PT to increase their independence and safety with mobility to allow discharge to the venue listed below.       Follow Up Recommendations SNF;Supervision/Assistance - 24 hour    Equipment Recommendations  None recommended by PT    Recommendations for Other Services Rehab consult     Precautions / Restrictions Precautions Precautions: Fall Precaution Comments: L inattention Restrictions Weight Bearing  Restrictions: No      Mobility  Bed Mobility Overal bed mobility: Needs Assistance             General bed mobility comments: Pt able to reach with LUE for railing to attempt to roll to R side. Noted pt reaching UE's up overhead to grab for headboard when PT/OT slid pt up in bed.   Transfers                 General transfer comment: Pt with labored breathing (3/4 on dyspnea scale) and O2 sats 89-90% on 3L/min supplemental O2. Clinical decision was made not to advance mobility at this time.   Ambulation/Gait                Stairs            Wheelchair Mobility    Modified Rankin (Stroke Patients Only) Modified Rankin (Stroke Patients Only) Pre-Morbid Rankin Score: Slight disability Modified Rankin: Severe disability     Balance Overall balance assessment: (Unable to assess)                                           Pertinent Vitals/Pain Pain Assessment: Faces Faces Pain Scale: Hurts a little bit Pain Location: Pt appears generally uncomfortable at rest in bed.  Pain Descriptors / Indicators: Grimacing Pain Intervention(s): Limited activity within patient's tolerance;Monitored during session;Repositioned    Home Living Family/patient expects to be discharged to:: Private residence Living Arrangements: Alone Available Help at Discharge: Family;Available  PRN/intermittently(pt states she would have to ask her son) Type of Home: Mobile home Home Access: Stairs to enter Entrance Stairs-Rails: Right Entrance Stairs-Number of Steps: 2 Home Layout: One level Home Equipment: Cane - single point;Walker - 4 wheels;Tub bench      Prior Function Level of Independence: Independent with assistive device(s)         Comments: uses a RW. Pt reports son took her car away, son would drive her.     Hand Dominance   Dominant Hand: Right    Extremity/Trunk Assessment   Upper Extremity Assessment Upper Extremity Assessment: Defer to  OT evaluation    Lower Extremity Assessment Lower Extremity Assessment: Generalized weakness;LLE deficits/detail LLE Deficits / Details: Appears to have less active movement in LLE vs right, however unable to fully assess in bed. Pt attempting to move LE's to command, however when assisting pt into knee flexion, pt resisting.    Cervical / Trunk Assessment Cervical / Trunk Assessment: (Unable to assess)  Communication   Communication: Expressive difficulties  Cognition Arousal/Alertness: Lethargic Behavior During Therapy: Flat affect Overall Cognitive Status: Impaired/Different from baseline Area of Impairment: Orientation;Memory;Following commands;Safety/judgement;Awareness;Problem solving                 Orientation Level: Disoriented to;Situation(Did not get to ask about orientation to time)     Following Commands: Follows one step commands inconsistently Safety/Judgement: Decreased awareness of deficits Awareness: Intellectual Problem Solving: Slow processing;Decreased initiation;Difficulty sequencing;Requires verbal cues;Requires tactile cues        General Comments      Exercises     Assessment/Plan    PT Assessment Patient needs continued PT services  PT Problem List Decreased strength;Decreased activity tolerance;Decreased balance;Decreased mobility;Decreased cognition;Decreased knowledge of use of DME;Decreased safety awareness;Decreased knowledge of precautions;Cardiopulmonary status limiting activity       PT Treatment Interventions DME instruction;Gait training;Stair training;Functional mobility training;Therapeutic activities;Therapeutic exercise;Neuromuscular re-education;Patient/family education;Cognitive remediation;Balance training    PT Goals (Current goals can be found in the Care Plan section)  Acute Rehab PT Goals Patient Stated Goal: None stated PT Goal Formulation: With patient Time For Goal Achievement: 12/09/18 Potential to Achieve Goals:  Fair    Frequency Min 4X/week   Barriers to discharge        Co-evaluation PT/OT/SLP Co-Evaluation/Treatment: Yes Reason for Co-Treatment: Complexity of the patient's impairments (multi-system involvement);Necessary to address cognition/behavior during functional activity;For patient/therapist safety;To address functional/ADL transfers PT goals addressed during session: Strengthening/ROM;Mobility/safety with mobility         AM-PAC PT "6 Clicks" Mobility  Outcome Measure Help needed turning from your back to your side while in a flat bed without using bedrails?: A Little Help needed moving from lying on your back to sitting on the side of a flat bed without using bedrails?: A Lot Help needed moving to and from a bed to a chair (including a wheelchair)?: Total Help needed standing up from a chair using your arms (e.g., wheelchair or bedside chair)?: Total Help needed to walk in hospital room?: Total Help needed climbing 3-5 steps with a railing? : Total 6 Click Score: 9    End of Session Equipment Utilized During Treatment: Gait belt Activity Tolerance: Patient limited by fatigue Patient left: in bed;with call bell/phone within reach;with bed alarm set Nurse Communication: Mobility status;Other (comment)(O2 status (84% on RA at beginning of session)) PT Visit Diagnosis: Other symptoms and signs involving the nervous system (R60.454(R29.898)    Time: [id number] PT Time Calculation (min) (ACUTE ONLY): 27 min  Charges:   PT Evaluation $PT Eval Moderate Complexity: 1 Mod          Rolinda Roan, PT, DPT Acute Rehabilitation Services Pager: [phone] Office: [phone]   Thelma Comp 12/02/2018, 3:01 PM

## 2018-12-02 NOTE — Progress Notes (Signed)
PT Cancellation Note  Patient Details Name: [name] [name] MRN: [id number] DOB: [birth year]   Cancelled Treatment:    Reason Eval/Treat Not Completed: Patient at procedure or test/unavailable. Pt currently off unit for testing. Will continue to follow and initiate PT eval when pt is available and agreeable.   Thelma Comp 12/02/2018, 9:19 AM   Rolinda Roan, PT, DPT Acute Rehabilitation Services Pager: [phone] Office: [phone]

## 2018-12-02 NOTE — Progress Notes (Signed)
Patient came back and had Cortrek place.  Dr.  Ronnald Ramp notified and looked at the post cortrek placement Xray,  Verbal order to start tube feeding.

## 2018-12-02 NOTE — Progress Notes (Signed)
STROKE TEAM PROGRESS NOTE   INTERVAL HISTORY Primary team is at bedside. Pt severe dysarthria but fully orientated, mild respiratory distress.  Still has left arm weakness left hemianopia and neglect.  Vitals:   12/02/18 0400 12/02/18 0600 12/02/18 0800 12/02/18 1206  BP: (!) 108/96 129/85 (!) 198/77 (!) 108/96  Pulse: 66 72 63 88  Resp: 20 18 18 18   Temp: 98.9 F (37.2 C) 98.8 F (37.1 C) 97.8 F (36.6 C) 98 F (36.7 C)  TempSrc: Oral Oral Oral Oral  SpO2: 94% 92% 100% 98%  Weight:      Height:        CBC:  Recent Labs  Lab 12/01/18 1900  WBC 9.2  NEUTROABS 7.3  HGB 10.6*  HCT 32.6*  MCV 94.5  PLT 371    Basic Metabolic Panel:  Recent Labs  Lab 12/01/18 1900 12/02/18 0352  NA 137 140  K 4.6 4.3  CL 109 113*  CO2 20* 18*  GLUCOSE 297* 259*  BUN 31* 25*  CREATININE 1.69* 1.43*  CALCIUM 8.8* 8.9   Lipid Panel:     Component Value Date/Time   CHOL 145 12/02/2018 0352   CHOL 170 05/18/2013 0501   TRIG 105 12/02/2018 0352   TRIG 133 05/18/2013 0501   HDL 46 12/02/2018 0352   HDL 47 05/18/2013 0501   CHOLHDL 3.2 12/02/2018 0352   VLDL 21 12/02/2018 0352   VLDL 27 05/18/2013 0501   LDLCALC 78 12/02/2018 0352   LDLCALC 96 05/18/2013 0501   HgbA1c:  Lab Results  Component Value Date   HGBA1C 9.2 (H) 12/02/2018   Urine Drug Screen:     Component Value Date/Time   LABOPIA NONE DETECTED 12/01/2018 2211   COCAINSCRNUR NONE DETECTED 12/01/2018 2211   LABBENZ NONE DETECTED 12/01/2018 2211   AMPHETMU NONE DETECTED 12/01/2018 2211   THCU NONE DETECTED 12/01/2018 2211   LABBARB NONE DETECTED 12/01/2018 2211    Alcohol Level     Component Value Date/Time   ETH <10 12/01/2018 1900    IMAGING Dg Chest 1 View  Result Date: 12/01/2018 CLINICAL DATA:  Left-sided weakness and bruising following a fall today. EXAM: CHEST  1 VIEW COMPARISON:  07/15/2018. FINDINGS: No significant change in enlargement of the cardiac silhouette. Interval minimal linear  atelectasis or scarring in the left mid lung zone. Otherwise, clear lungs with normal vascularity. No fracture or pneumothorax seen. No pleural fluid. Thoracic spine degenerative changes. IMPRESSION: No acute abnormality. Stable cardiomegaly. Electronically Signed   By: Claudie Revering M.D.   On: 12/01/2018 19:35   Ct Head Wo Contrast  Result Date: 12/01/2018 CLINICAL DATA:  Left-sided weakness. EXAM: CT HEAD WITHOUT CONTRAST TECHNIQUE: Contiguous axial images were obtained from the base of the skull through the vertex without intravenous contrast. COMPARISON:  Head CT 07/15/2018 and MRI 07/16/2018 FINDINGS: Brain: A moderate-sized chronic left MCA infarct is again noted centered in the frontal operculum. Patchy hypodensities are again seen in the cerebral white matter bilaterally, nonspecific but compatible with moderate chronic small vessel ischemic disease. There is the suggestion of new mild asymmetric hypoattenuation of right parietal white matter with questionably diminished gray-white differentiation (for example series 2, images 18 and 19). No acute intracranial hemorrhage, midline shift, or extra-axial fluid collection is identified. There is mild cerebral atrophy. A 1 cm focus of extra-axial calcification posteriorly over the right frontal convexity is unchanged and may represent a meningioma. Vascular: Calcified atherosclerosis at the skull base. No hyperdense vessel. Skull: No fracture  or focal osseous lesion. Sinuses/Orbits: Clear paranasal sinuses. Trace mastoid effusions. Bilateral cataract extraction. Other: None. IMPRESSION: 1. Possible early acute right parietal infarct. 2. No acute intracranial hemorrhage. 3. Chronic ischemia including an old left MCA infarct. Electronically Signed   By: Sebastian Ache M.D.   On: 12/01/2018 20:34   Mr Brain Wo Contrast  Result Date: 12/02/2018 CLINICAL DATA:  Stroke workup EXAM: MRI HEAD WITHOUT CONTRAST TECHNIQUE: Multiplanar, multiecho pulse sequences of the  brain and surrounding structures were obtained without intravenous contrast. COMPARISON:  Head CT from earlier today FINDINGS: Brain: Large area of acute right MCA territory infarct centered at the posterior insula and posterior frontal lobe. There is petechial hemorrhage superimposed. Large remote left MCA territory infarct affecting insula and lateral frontal lobe as well. Remote lacunar infarct in the left thalamus and right pons. Ischemic gliosis in the cerebral white matter. No masslike finding or hydrocephalus. Vascular: Grossly preserved flow voids Skull and upper cervical spine: Negative gross marrow lesion Sinuses/Orbits: Bilateral cataract resection Other: Cystic appearing mass in the right parotid measuring 21 mm, stable since 07/16/2018 and likely incidental given comorbidities. Significantly motion degraded study which could obscure pathology. IMPRESSION: 1. Large, acute right MCA branch infarct with petechial hemorrhage. 2. Remote left MCA branch infarct and remote lacunar infarcts in the left thalamus and right pons. 3. Significantly motion degraded study. Electronically Signed   By: Marnee Spring M.D.   On: 12/02/2018 05:05   Dg Hip Unilat W Or Wo Pelvis 2-3 Views Left  Result Date: 12/01/2018 CLINICAL DATA:  Left-sided weakness and bruising following a fall today. EXAM: DG HIP (WITH OR WITHOUT PELVIS) 2-3V LEFT COMPARISON:  10/23/2015. FINDINGS: Interval right hip prosthesis. Diffuse osteopenia. No fracture or dislocation seen. Lower lumbar spine degenerative changes. Extensive arterial calcifications. IMPRESSION: No acute abnormality. Electronically Signed   By: Beckie Salts M.D.   On: 12/01/2018 19:36    PHYSICAL EXAM  Temp:  [97.8 F (36.6 C)-98.9 F (37.2 C)] 98.2 F (36.8 C) (07/15 1516) Pulse Rate:  [48-88] 69 (07/15 1516) Resp:  [15-25] 18 (07/15 1516) BP: (108-198)/(58-96) 126/65 (07/15 1516) SpO2:  [92 %-100 %] 98 % (07/15 1516) Weight:  [88 kg-90.7 kg] 88 kg (07/15  0000)  General - Well nourished, well developed, in mild respiratory distress.  Ophthalmologic - fundi not visualized due to noncooperation.  Cardiovascular - irregularly irregular heart rate and rhythm.  Neuro - lethargic, mild SOB, but eyes open, awake alert.  AAO x3.  Severe dysarthria, able to name and repeat, follow commands.  Left facial droop, PERRLA, right gaze preference.  Left hemianopia, left neglect.  Tongue midline.  Left arm 2+/5, not against gravity.  Right arm 4/5.  However bilateral lower extremity 2/5 on pain stimulation.  Bilateral Babinski positive.  Sensation symmetrical, coordination not corporative and gait not tested.    ASSESSMENT/PLAN [name] [name] is a 84 y.o. female with history of atrial fibrillation on Eliquis, prior L MCA stroke presenting with new onset left-sided weakness after being found unresponsive at home.  Reported history of headache, fatigue and diarrhea x1 week.  Patient reported a fall at time of onset..   Stroke:   R MCA branch infarct embolic secondary to known atrial fibrillation on Eliquis in setting of diarrhea x1 week   CT head possible early R parietal infarct.  No ICH.  Old L MCA infarct.  MRI large R MCA branch infarct with petechial hemorrhage.  Old L MCA infarct.  Old L thalamic  lacune.  Old R pontine lacune.  MRA  No LVO, no flow limiting anterior stenoses. Mod R and severe L P2 stenosis.   Carotid Doppler  B ICA 1-39% stenosis, VAs antegrade   2D Echo EF 55 to 60%  LDL 78  HgbA1c 9.2  Lovenox 30 mg subcu daily for VTE prophylaxis  aspirin 81 mg daily and Eliquis (apixaban) daily prior to admission, now on aspirin 300 mg suppository daily. Consider change to pradaxa in 5 days to avoid hemorrhagic conversion if remains stable.   Therapy recommendations:  SNF  Disposition:  pending   Atrial Fibrillation / Atrial Flutter  Home anticoagulation:  Eliquis (apixaban) daily   Rate controlled  Patient admitted that  sometimes she runs out of Eliquis  Eliquis on hold in hospital given risk of hemorrhagic conversion from large stroke   Consider change to pradaxa in 5 days if remains stable.   Hx stroke/TIA  10/2013 - admitted for aphasia and right-sided weakness MRI showed L MCA Stroke, etiology likely due to atrial flutter not on anticoagulation, started on Eliquis.  LDL 67 and A1c 7.7.  Also discharged on Lipitor 20  Patient also take aspirin 81 at home  Hypertension  Stable . Permissive hypertension (OK if <180/105) but gradually normalize in 3-5 days . Long-term BP goal normotensive  Hyperlipidemia  Home meds: Lipitor 20  now on Lipitor 40 in hospital  LDL 78, goal < 70  Continue statin at discharge  Diabetes type II Uncontrolled  HgbA1c 9.2, goal < 7.0  CBGs  SSI  On Lantus  DM coordinator consultation and close PCP follow-up after discharge  Dysphagia . Secondary to stroke . NPO . Speech on board   Other Stroke Risk Factors  Advanced age  Obesity, Body mass index is 34.37 kg/m., recommend weight loss, diet and exercise as appropriate   Coronary artery disease s/p PCI, stent  Chronic diastolic Congestive heart failure  Other Active Problems  CKD stage III  GERD  RA  Hospital day # 1  Marvel Plan, MD PhD Stroke Neurology 12/02/2018 4:43 PM  To contact Stroke Continuity provider, please refer to [email or web address]. After hours, contact General Neurology

## 2018-12-02 NOTE — Progress Notes (Addendum)
Inpatient Diabetes Program Recommendations  AACE/ADA: New Consensus Statement on Inpatient Glycemic Control (2015)  Target Ranges:  Prepandial:   less than 140 mg/dL      Peak postprandial:   less than 180 mg/dL (1-2 hours)      Critically ill patients:  140 - 180 mg/dL   Results for [name] [name], [name] [name] (MRN [id number]) as of 12/02/2018 08:49  Ref. Range 12/01/2018 18:24 12/01/2018 23:56  Glucose-Capillary Latest Ref Range: 70 - 99 mg/dL 276 (H) 213 (H)   Results for [name] [name], [name] [name] (MRN [id number]) as of 12/02/2018 08:49  Ref. Range 12/02/2018 03:52  Hemoglobin A1C Latest Ref Range: 4.8 - 5.6 % 9.2 (H)  (217 mg/dl)     Admit with: CVA  History: DM, CKD, CHF, CVA  Home DM Meds: Metformin 500 mg BID       Lantus 18 units Daily  Current Orders: Lantus 10 units QHS      Lantus not given last PM.  No orders for Novolog SSI.      MD- Please consider adding Novolog Moderate Correction Scale/ SSI (0-15 units) TID AC + HS      --Will follow patient during hospitalization--  Wyn Quaker RN, MSN, CDE Diabetes Coordinator Inpatient Glycemic Control Team Team Pager: [phone] (8a-5p)

## 2018-12-02 NOTE — Progress Notes (Signed)
Pt admitted from ED with stroke diagnosis, pt alert and oriented, c/o of pain in both legs, settled in bed with call light within pt's reach, tele monitor put and verified on pt, was however reassured and will continue to monitor, safety measures explained and initiated, v/s stable. Obasogie-Asidi, Annica Marinello Efe

## 2018-12-02 NOTE — Progress Notes (Signed)
Modified Barium Swallow Progress Note  Patient Details  Name: [name] [name] MRN: [id number] Date of Birth: 08/28/1934  Today's Date: 12/02/2018  Modified Barium Swallow completed.  Full report located under Chart Review in the Imaging Section.  Brief recommendations include the following:  Clinical Impression  Patient currently presents with moderately severe oropharyngeal dysphagia with sensorimotor deficits.  Testing was abbreviated due to pt's gross aspiration - therefore only nectar via tsp x2 was tested,  Gross lingual weakness results in grossly impaired = delayed oral transiting.  Pt extends head to aid oral transit which results in premature spillage of boluses into pharynx.  Gross aspiration occured before the swallow as bolus spilled into open airway due to oral discoordination and delay in pharyngeal swallow.  Aspiration followed by delayed weak cough which caused pt mild distress.  Trace residuals in pharynx mixed with retained secretions.  Pt reports some difficulty "catching her breath" after aspiration episode and benefited from verbal encouragement.      Testing was aborted at that time due to pt obvious discomfort. At this time only recommend pt have oral moisture via toothette after oral care.  Prognosis for swallow function to improve to allow a po diet is good however at this time she is at very high risk for aspiration and malnutrition with current respiratory status.      Recommend consider short term feeding tube to allow for spontaneous recovery from current stroke.  Due to silent nature of laryngeal penetration and weak cough, would recommend repeat MBS prior to po initiation.  Pt informed and reports understanding but continued to ask for water.  Recommend oral suction be set up for adequate oral care.  Notified MD of results of testing and recommendations.  Of note, pt does report premorbid "coughing" with liquids and sometimes food.     Swallow Evaluation  Recommendations       SLP Diet Recommendations: NPO(oral moisture via toothette)   Liquid Administration via: (toothette, water only after oral care)                  Oral Care Recommendations: Oral care QID       Luanna Salk, MS [facility] SLP Acute Rehab Services Pager [phone] Office [phone]  Macario Golds 12/02/2018,9:59 AM

## 2018-12-02 NOTE — Evaluation (Signed)
Occupational Therapy Evaluation Patient Details Name: [name] [name] MRN: [id number] DOB: [birth year] Today's Date: 12/02/2018    History of Present Illness Pt is an 84 y/o female who presents with stroke-like symptoms. MRI revealed large, acute R MCA infarct with petechial hemorrhage, as well as remote L MCA infarct and remote lacunar infarcts in L thalamus and R pons. PMH significant for L shoulder fracture s/p surgery 2014, RA, PAF, OA, hypothyroidism, DM, CKD, CHF, cerebral infarction, CAD, R TKR.   Clinical Impression   Pt admitted with above and presents to OT with impairments affecting ability to complete ADLs at [facility].  Limited eval due to dyspneic and with labored breathing (3/4 DOE).  O2 sats 84% on room air, notified RN and pt sats improved to 87% on 2L/min and eventually able to achieve 90% on 3L/O2 with cues for pursed-lip breathing.  Pt extremely fatigued on eval with difficulty maintaining arousal and following commands.  Pt with limited BUE active ROM when asked to wash face or reach for bed rails, however when scooting pt up in bed - pt did reach for rail overhead with RUE.  Per pt report, she was independent with ADLs and IADLs with Rollator.  Pt will benefit from continued OT acutely to improve participation in ADLs and functional mobility to prepare for d/c to venue listed below.      Follow Up Recommendations  SNF;Supervision/Assistance - 24 hour    Equipment Recommendations  None recommended by OT       Precautions / Restrictions Precautions Precautions: Fall Precaution Comments: L inattention, monitor O2 sats Restrictions Weight Bearing Restrictions: No      Mobility Bed Mobility Overal bed mobility: Needs Assistance             General bed mobility comments: Pt able to reach with LUE for railing to attempt to roll to R side. Noted pt reaching RUE up overhead to grab for headboard when PT/OT slid pt up in bed.  Transfers                 General  transfer comment: Pt with labored breathing (3/4 on dyspnea scale) and O2 sats 89-90% on 3L/min supplemental O2. Clinical decision was made not to advance mobility at this time.     Balance Overall balance assessment: (Unable to assess)                                         ADL either performed or assessed with clinical judgement   ADL Overall ADL's : Needs assistance/impaired     Grooming: Wash/dry face;Wash/dry hands;Moderate assistance;Bed level   Upper Body Bathing: Maximal assistance;Bed level   Lower Body Bathing: +2 for physical assistance;Bed level;Maximal assistance   Upper Body Dressing : Maximal assistance;Bed level   Lower Body Dressing: Maximal assistance;+2 for physical assistance;Bed level                 General ADL Comments: Did not assess OOB transfers due to lethargy     Vision  Lt inattention, not formally assessed due to lethargy and increased work of breathing.  However did present with Rt gaze preference and Lt inattention.              Pertinent Vitals/Pain Pain Assessment: Faces Faces Pain Scale: Hurts a little bit Pain Location: Pt appears generally uncomfortable at rest in bed.  Pain Descriptors / Indicators:  Grimacing Pain Intervention(s): Limited activity within patient's tolerance;Monitored during session;Repositioned     Hand Dominance Right   Extremity/Trunk Assessment Upper Extremity Assessment Upper Extremity Assessment: RUE deficits/detail;Generalized weakness;LUE deficits/detail RUE Deficits / Details: minimal shoulder movement. Question if lethargy impacting mobility vs difficulty following directions RUE Coordination: decreased fine motor;decreased gross motor LUE Deficits / Details: minimal shoulder movement.  Question if lethargy impacting mobility vs difficulty following directions LUE Coordination: decreased gross motor;decreased fine motor   Lower Extremity Assessment Lower Extremity Assessment:  Generalized weakness;LLE deficits/detail LLE Deficits / Details: Appears to have less active movement in LLE vs right, however unable to fully assess in bed. Pt attempting to move LE's to command, however when assisting pt into knee flexion, pt resisting.   Cervical / Trunk Assessment Cervical / Trunk Assessment: (Unable to assess)   Communication Communication Communication: Expressive difficulties   Cognition Arousal/Alertness: Lethargic Behavior During Therapy: Flat affect Overall Cognitive Status: Impaired/Different from baseline Area of Impairment: Orientation;Memory;Following commands;Safety/judgement;Awareness;Problem solving                 Orientation Level: Disoriented to;Situation(Did not get to ask about orientation to time)     Following Commands: Follows one step commands inconsistently Safety/Judgement: Decreased awareness of deficits Awareness: Intellectual Problem Solving: Slow processing;Decreased initiation;Difficulty sequencing;Requires verbal cues;Requires tactile cues                Home Living Family/patient expects to be discharged to:: Private residence Living Arrangements: Alone Available Help at Discharge: Family;Available PRN/intermittently(pt states she would have to ask her son) Type of Home: Mobile home Home Access: Stairs to enter Entrance Stairs-Number of Steps: 2 Entrance Stairs-Rails: Right Home Layout: One level     Bathroom Shower/Tub: Chief Strategy Officer: Standard Bathroom Accessibility: Yes How Accessible: Accessible via walker Home Equipment: Cane - single point;Walker - 4 wheels;Tub bench      Lives With: Alone    Prior Functioning/Environment Level of Independence: Independent with assistive device(s)        Comments: uses a RW. Pt reports son took her car away, son would drive her.        OT Problem List: Decreased strength;Decreased range of motion;Decreased activity tolerance;Impaired balance  (sitting and/or standing);Decreased coordination;Decreased safety awareness;Cardiopulmonary status limiting activity;Impaired UE functional use      OT Treatment/Interventions: Self-care/ADL training;Energy conservation;DME and/or AE instruction;Therapeutic activities;Patient/family education;Balance training    OT Goals(Current goals can be found in the care plan section) Acute Rehab OT Goals Patient Stated Goal: None stated OT Goal Formulation: With patient Time For Goal Achievement: 12/16/18 Potential to Achieve Goals: Good  OT Frequency: Min 2X/week   Barriers to D/C: Decreased caregiver support          Co-evaluation PT/OT/SLP Co-Evaluation/Treatment: Yes Reason for Co-Treatment: Complexity of the patient's impairments (multi-system involvement);Necessary to address cognition/behavior during functional activity;For patient/therapist safety;To address functional/ADL transfers PT goals addressed during session: Strengthening/ROM;Mobility/safety with mobility OT goals addressed during session: ADL's and self-care      AM-PAC OT "6 Clicks" Daily Activity     Outcome Measure Help from another person eating meals?: Total Help from another person taking care of personal grooming?: A Lot Help from another person toileting, which includes using toliet, bedpan, or urinal?: Total Help from another person bathing (including washing, rinsing, drying)?: A Lot Help from another person to put on and taking off regular upper body clothing?: A Lot Help from another person to put on and taking off regular lower body clothing?: Total  6 Click Score: 9   End of Session Nurse Communication: (O2 sats)  Activity Tolerance: Patient limited by lethargy;Treatment limited secondary to medical complications (Comment)(DOE and labored breathing) Patient left: in bed;with call bell/phone within reach;with bed alarm set  OT Visit Diagnosis: Unsteadiness on feet (R26.81);Repeated falls (R29.6);Muscle  weakness (generalized) (M62.81);History of falling (Z91.81)                Time: 8527-7824 OT Time Calculation (min): 21 min Charges:  OT General Charges $OT Visit: 1 Visit OT Evaluation $OT Eval Moderate Complexity: 1 Mod   Rosalio Loud , [phone] 12/02/2018, 3:52 PM

## 2018-12-02 NOTE — Progress Notes (Signed)
OT Cancellation Note  Patient Details Name: [name] [name] MRN: [id number] DOB: Sep 22, 1934   Cancelled Treatment:    Reason Eval/Treat Not Completed: Patient at procedure or test/unavailable. Pt currently off unit for testing. Will continue to follow and initiate OT eval when pt is available.  [name] 12/02/2018, 1:55 PM

## 2018-12-02 NOTE — Evaluation (Signed)
Clinical/Bedside Swallow Evaluation Patient Details  Name: [name] [name] MRN: [id number] Date of Birth: Oct 30, 1934  Today's Date: 12/02/2018 Time: SLP Start Time (ACUTE ONLY): 0757 SLP Stop Time (ACUTE ONLY): 0820 SLP Time Calculation (min) (ACUTE ONLY): 23 min  Past Medical History:  Past Medical History:  Diagnosis Date  . Asthma   . CAD (coronary artery disease)    a. 2000 s/p PCI RCA;  b. 2005 PCI of RCA 2/2 ISR; c. 04/2013 Neg MV;  d. 05/2013 Cath: LM 20, LAD 30p, LCX 20p, OM1 90 small, RCA 29m (PTCA - FFR 0.86), PDA 40.  Marland Kitchen Cerebral infarction ([place])   . Chronic diastolic CHF (congestive heart failure) ([place])    a. 10/2013 Echo: EF 60-65%, no rwma, Gr1 DD, mild MR, mildly dil LA.  . CKD (chronic kidney disease), stage III ([place])   . DM (diabetes mellitus) ([place])   . Dyslipidemia   . GERD (gastroesophageal reflux disease)   . Hypertensive heart disease   . Hypothyroidism    hx  . Obesity   . Osteoarthritis   . PAF (paroxysmal atrial fibrillation) ([place])    a. s/p dccv 04/2014;  b. amio/eliquis;  c. 06/2013 bb/ccb d/c 2/2 symptomatic bradycardia; d. 04/2015 recurrent AF->amio increased/bb resumed.  . Rheumatoid arthritis(714.0)   . Right rib fracture    a. 04/2015.  Marland Kitchen Shoulder fracture, left    a. Dr. Mack Guise   Past Surgical History:  Past Surgical History:  Procedure Laterality Date  . CARDIAC CATHETERIZATION  06/11/2013  . cataract surgery    . CORONARY ANGIOPLASTY    . ELECTROPHYSIOLOGIC STUDY N/A 07/25/2015   Procedure: CARDIOVERSION;  Surgeon: Minna Merritts, MD;  Location: [facility] ORS;  Service: Cardiovascular;  Laterality: N/A;  . partial hysterectomy    . RCA stent placement  2000  . REPLACEMENT TOTAL KNEE  2013   right  . restenosis with PTCA placement  2005  . SHOULDER SURGERY  02/04/13   HPI:  84 yo female adm to [facility] *transfer* with left sided weakness. Large area of acute right MCA territory infarct centered at the posterior insula and posterior frontal lobe.  There is petechial hemorrhage superimposed. Large remote left MCA territory infarct affecting insula and lateral frontal lobe as well. Remote lacunar infarct in the left thalamus and right pons.  Pt did not pass [facility] swallow screen and thus SLP eval ordered.Pt also with h/o HTN, dyspnea, pna, GERD, UTI, bradycardia, renal fx.  Pt reports she lives alone in a trailer with a bird.   Assessment / Plan / Recommendation Clinical Impression  Patient presents with clinical indications of significant oropharyngeal dysphagia.  CN deficits include facial, trigeminal, hypoglossal with possible vagal nerve deficits. Sensorimotor deficits present impacting left facial/labial and lingual musculature.  Poor labial closure results in left anterior loss without awareness.  Decreased sustained attention to tasks noted requiring cues for pt to swallow.  She demonstrates indication of aspiration with nectar thick liquids via tsp. SLP Visit Diagnosis: Dysphagia, oropharyngeal phase (R13.12)    Aspiration Risk  Moderate aspiration risk    Diet Recommendation NPO   Postural Changes: Seated upright at 90 degrees    Other  Recommendations Oral Care Recommendations: Oral care QID   Follow up Recommendations Skilled Nursing facility      Frequency and Duration min 2x/week  2 weeks       Prognosis Prognosis for Safe Diet Advancement: Guarded Barriers to Reach Goals: Severity of deficits      Swallow Study  General Date of Onset: 12/01/18 HPI: 84 yo female adm to [facility] *transfer* with left sided weakness. Large area of acute right MCA territory infarct centered at the posterior insula and posterior frontal lobe. There is petechial hemorrhage superimposed. Large remote left MCA territory infarct affecting insula and lateral frontal lobe as well. Remote lacunar infarct in the left thalamus and right pons.  Pt did not pass Yale swallow screen and thus SLP eval ordered.Pt also with h/o HTN, dyspnea, pna, GERD, UTI,  bradycardia, renal fx.  Pt reports she lives alone in a trailer with a bird. Type of Study: Bedside Swallow Evaluation Diet Prior to this Study: NPO Temperature Spikes Noted: No Respiratory Status: Room air History of Recent Intubation: No Behavior/Cognition: Alert;Cooperative;Pleasant mood Oral Cavity Assessment: Dry Oral Care Completed by SLP: Yes Oral Cavity - Dentition: (few dentition intact) Self-Feeding Abilities: Total assist Patient Positioning: Upright in bed Baseline Vocal Quality: Low vocal intensity;Breathy;Suspected CN X (Vagus) involvement Volitional Cough: Weak Volitional Swallow: Unable to elicit    Oral/Motor/Sensory Function Overall Oral Motor/Sensory Function: Moderate impairment Facial ROM: Reduced left Facial Symmetry: Abnormal symmetry left Facial Strength: Reduced left Facial Sensation: Reduced left Lingual ROM: Reduced left Lingual Symmetry: Abnormal symmetry left Lingual Strength: Suspected CN XII (hypoglossal) dysfunction Velum: (sluggish but appeared bilateral) Mandible: (dnt)   Ice Chips Ice chips: Impaired Presentation: Spoon Oral Phase Impairments: Reduced labial seal;Reduced lingual movement/coordination;Poor awareness of bolus Oral Phase Functional Implications: Prolonged oral transit;Left anterior spillage   Thin Liquid Thin Liquid: Not tested    Nectar Thick Nectar Thick Liquid: Impaired Presentation: Spoon Oral Phase Impairments: Reduced labial seal Pharyngeal Phase Impairments: Suspected delayed Swallow;Cough - Immediate   Honey Thick Honey Thick Liquid: Not tested   Puree Puree: Impaired Presentation: Spoon Oral Phase Impairments: Reduced labial seal;Reduced lingual movement/coordination Oral Phase Functional Implications: Prolonged oral transit Pharyngeal Phase Impairments: Suspected delayed Swallow   Solid     Solid: Not tested      Chales Abrahams 12/02/2018,8:44 AM  Donavan Burnet, MS [facility] SLP Acute Rehab Services Pager  [phone] Office [phone]

## 2018-12-03 DIAGNOSIS — R4701 Aphasia: Secondary | ICD-10-CM

## 2018-12-03 DIAGNOSIS — R531 Weakness: Secondary | ICD-10-CM

## 2018-12-03 DIAGNOSIS — I4891 Unspecified atrial fibrillation: Secondary | ICD-10-CM

## 2018-12-03 DIAGNOSIS — N179 Acute kidney failure, unspecified: Secondary | ICD-10-CM

## 2018-12-03 DIAGNOSIS — R1310 Dysphagia, unspecified: Secondary | ICD-10-CM

## 2018-12-03 DIAGNOSIS — Z7901 Long term (current) use of anticoagulants: Secondary | ICD-10-CM

## 2018-12-03 DIAGNOSIS — I482 Chronic atrial fibrillation, unspecified: Secondary | ICD-10-CM

## 2018-12-03 LAB — URINE CULTURE: Culture: >=100000 — AB

## 2018-12-03 LAB — BASIC METABOLIC PANEL
Anion gap: 9 (ref 5–15)
BUN: 25 mg/dL — ABNORMAL HIGH (ref 8–23)
CO2: 19 mmol/L — ABNORMAL LOW (ref 22–32)
Calcium: 8.8 mg/dL — ABNORMAL LOW (ref 8.9–10.3)
Chloride: 113 mmol/L — ABNORMAL HIGH (ref 98–111)
Creatinine, Ser: 1.49 mg/dL — ABNORMAL HIGH (ref 0.44–1.00)
GFR calc Af Amer: 38 mL/min — ABNORMAL LOW (ref >60)
GFR calc non Af Amer: 33 mL/min — ABNORMAL LOW (ref >60)
Glucose, Bld: 179 mg/dL — ABNORMAL HIGH (ref 70–99)
Potassium: 3.9 mmol/L (ref 3.5–5.1)
Sodium: 141 mmol/L (ref 135–145)

## 2018-12-03 LAB — GLUCOSE, CAPILLARY
Glucose-Capillary: 160 mg/dL — ABNORMAL HIGH (ref 70–99)
Glucose-Capillary: 176 mg/dL — ABNORMAL HIGH (ref 70–99)
Glucose-Capillary: 178 mg/dL — ABNORMAL HIGH (ref 70–99)
Glucose-Capillary: 192 mg/dL — ABNORMAL HIGH (ref 70–99)
Glucose-Capillary: 201 mg/dL — ABNORMAL HIGH (ref 70–99)
Glucose-Capillary: 228 mg/dL — ABNORMAL HIGH (ref 70–99)
Glucose-Capillary: 235 mg/dL — ABNORMAL HIGH (ref 70–99)
Glucose-Capillary: 243 mg/dL — ABNORMAL HIGH (ref 70–99)

## 2018-12-03 MED ORDER — ENOXAPARIN SODIUM 30 MG/0.3ML ~~LOC~~ SOLN
30.0000 mg | SUBCUTANEOUS | Status: DC
  Administered 2018-12-03 – 2018-12-05 (×3): 30 mg via SUBCUTANEOUS
  Filled 2018-12-03 (×3): qty 0.3

## 2018-12-03 MED ORDER — LEVOTHYROXINE SODIUM 75 MCG PO TABS
150.0000 ug | ORAL_TABLET | Freq: Every day | ORAL | Status: DC
  Administered 2018-12-04 – 2018-12-07 (×4): 150 ug
  Filled 2018-12-03 (×4): qty 2

## 2018-12-03 MED ORDER — PRO-STAT SUGAR FREE PO LIQD
30.0000 mL | Freq: Every day | ORAL | Status: DC
  Administered 2018-12-03 – 2018-12-08 (×5): 30 mL
  Filled 2018-12-03 (×5): qty 30

## 2018-12-03 MED ORDER — JEVITY 1.2 CAL PO LIQD
1000.0000 mL | ORAL | Status: DC
  Administered 2018-12-03 (×2): 1000 mL
  Filled 2018-12-03 (×5): qty 1000

## 2018-12-03 NOTE — Progress Notes (Signed)
Physical Therapy Treatment Patient Details Name: [name] [name] MRN: [id number] DOB: [birth year] Today's Date: 12/03/2018    History of Present Illness Pt is an 84 y/o female who presents with stroke-like symptoms. MRI revealed large, acute R MCA infarct with petechial hemorrhage, as well as remote L MCA infarct and remote lacunar infarcts in L thalamus and R pons. PMH significant for L shoulder fracture s/p surgery 2014, RA, PAF, OA, hypothyroidism, DM, CKD, CHF, cerebral infarction, CAD, R TKR.    PT Comments    Patient seen for mobility progression. Pt tolerated sitting EOB without DOE on 3L O2 via [place]. Continue to progress as tolerated with anticipated d/c to SNF for further skilled PT services.     Follow Up Recommendations  SNF;Supervision/Assistance - 24 hour     Equipment Recommendations  None recommended by PT    Recommendations for Other Services       Precautions / Restrictions Precautions Precautions: Fall Precaution Comments: L inattention, monitor O2 sats Restrictions Weight Bearing Restrictions: No    Mobility  Bed Mobility Overal bed mobility: Needs Assistance Bed Mobility: Rolling;Sidelying to Sit;Sit to Sidelying Rolling: Mod assist Sidelying to sit: Max assist     Sit to sidelying: Max assist;+2 for physical assistance General bed mobility comments: cues for sequencing and technique; assist with bed pad to bring hips to EOB and to assis to elevate trunk into sitting   Transfers                 General transfer comment: unable to progress to OOB due to pt having large BM and nursing staff notified for hygiene  Ambulation/Gait                 Stairs             Wheelchair Mobility    Modified Rankin (Stroke Patients Only) Modified Rankin (Stroke Patients Only) Pre-Morbid Rankin Score: Slight disability Modified Rankin: Severe disability     Balance Overall balance assessment: Needs assistance Sitting-balance support:  Single extremity supported;Feet supported Sitting balance-Leahy Scale: Poor Sitting balance - Comments: assistance required to maintain balance sitting EOB; L lateral bias; pt did tolerate well                                    Cognition Arousal/Alertness: Awake/alert Behavior During Therapy: Flat affect Overall Cognitive Status: Impaired/Different from baseline(difficulty to assess) Area of Impairment: Following commands;Safety/judgement;Problem solving                       Following Commands: Follows one step commands inconsistently;Follows one step commands with increased time Safety/Judgement: Decreased awareness of deficits   Problem Solving: Slow processing;Decreased initiation;Difficulty sequencing;Requires verbal cues;Requires tactile cues        Exercises      General Comments General comments (skin integrity, edema, etc.): pt on 3L O2 via [place] throughout       Pertinent Vitals/Pain Pain Assessment: Faces Faces Pain Scale: Hurts a little bit Pain Location: discomfort with bed mobility Pain Descriptors / Indicators: Grimacing Pain Intervention(s): Limited activity within patient's tolerance;Monitored during session;Repositioned    Home Living                      Prior Function            PT Goals (current goals can now be found in the care plan section)  Acute Rehab PT Goals Patient Stated Goal: None stated Progress towards PT goals: Progressing toward goals    Frequency    Min 4X/week      PT Plan Current plan remains appropriate    Co-evaluation              AM-PAC PT "6 Clicks" Mobility   Outcome Measure  Help needed turning from your back to your side while in a flat bed without using bedrails?: A Little Help needed moving from lying on your back to sitting on the side of a flat bed without using bedrails?: A Lot Help needed moving to and from a bed to a chair (including a wheelchair)?: A Lot Help needed  standing up from a chair using your arms (e.g., wheelchair or bedside chair)?: A Lot Help needed to walk in hospital room?: Total Help needed climbing 3-5 steps with a railing? : Total 6 Click Score: 11    End of Session   Activity Tolerance: Patient tolerated treatment well Patient left: in bed;with call bell/phone within reach;with bed alarm set Nurse Communication: Mobility status;Other (comment)(needs to be cleaned up) PT Visit Diagnosis: Other symptoms and signs involving the nervous system (Q25.956)     Time: 3875-6433 PT Time Calculation (min) (ACUTE ONLY): 18 min  Charges:  $Therapeutic Activity: 8-22 mins                     Earney Navy, PTA Acute Rehabilitation Services Pager: [phone] Office: [phone]     Darliss Cheney 12/03/2018, 4:00 PM

## 2018-12-03 NOTE — Progress Notes (Signed)
Initial Nutrition Assessment   RD working remotely.  DOCUMENTATION CODES:   Obesity unspecified  INTERVENTION:  Increase Jevity 1.2 formula via post pyloric Cortrak NGT to new goal rate of 60 ml/hr.   30 ml Prostat once daily per tube.    Tube feeding regimen provides 1828 kcal (100% of needs),  95 grams of protein, and 1166 ml of H2O.   NUTRITION DIAGNOSIS:   Inadequate oral intake related to inability to eat as evidenced by NPO status.  GOAL:   Patient will meet greater than or equal to 90% of their needs  MONITOR:   TF tolerance, Skin, Weight trends, Labs, I & O's  REASON FOR ASSESSMENT:   Consult Enteral/tube feeding initiation and management  ASSESSMENT:   84 year old female with a significant PMH of a fib, diabetes, CKD stage III, diastolic CHF, CAD, and left MCA stroke who presented for weakness after having a fall and was found to have a probable R parietal infarct.  RD unable to obtain pt nutrition history during attempted time of contact. Pt underwent MBS yesterday. Pt with moderately severe oropharyngeal dysphagia with sensorimotor deficits. Pt NPO due to aspiration risk. Cortrak NGT placed yesterday by IR. Tip of tube placed post pyloric. Tube feedings initiated yesterday evening.   Unable to complete Nutrition-Focused physical exam at this time.   Labs and medications reviewed.   Diet Order:   Diet Order            Diet NPO time specified  Diet effective now              EDUCATION NEEDS:   Not appropriate for education at this time  Skin:  Skin Assessment: Reviewed RN Assessment  Last BM:  7/15  Height:   Ht Readings from Last 1 Encounters:  12/02/18 5\' 3"  (1.6 m)    Weight:   Wt Readings from Last 1 Encounters:  12/02/18 88 kg    Ideal Body Weight:  52.27 kg  BMI:  Body mass index is 34.37 kg/m.  Estimated Nutritional Needs:   Kcal:  1700-1900  Protein:  85-100 grams  Fluid:  >/= 1.7 L/day    Corrin Parker, MS, RD,  LDN Pager # [phone] After hours/ weekend pager # [phone]

## 2018-12-03 NOTE — Progress Notes (Signed)
  Date: 12/03/2018  Patient name: [name] [name]  Medical record number: [id number]  Date of birth: 06-Oct-1934   I have seen and evaluated this patient and I have discussed the plan of care with the house staff. Please see Dr. Lonzo Candy note for complete details. I concur with his findings and plan.     Sid Falcon, MD 12/03/2018, 4:05 PM

## 2018-12-03 NOTE — Progress Notes (Signed)
STROKE TEAM PROGRESS NOTE   INTERVAL HISTORY Patient lying in bed, sleepy, able to arouse with repetitive stimulation.  Gurgling with increased secretions.  Severe dysarthria.  Has feeding tube on tube feeding.  Vitals:   12/02/18 2342 12/03/18 0336 12/03/18 0900 12/03/18 1100  BP: (!) 184/94 (!) 178/83 (!) 118/94 120/82  Pulse: 63 62 64 68  Resp: 20 20 (!) 23 (!) 23  Temp: 99.2 F (37.3 C) 98.8 F (37.1 C) 98.2 F (36.8 C) 98.8 F (37.1 C)  TempSrc: Oral Oral Axillary Axillary  SpO2: 97% 100% 100% 96%  Weight:      Height:        CBC:  Recent Labs  Lab 12/01/18 1900  WBC 9.2  NEUTROABS 7.3  HGB 10.6*  HCT 32.6*  MCV 94.5  PLT 237    Basic Metabolic Panel:  Recent Labs  Lab 12/02/18 0352 12/03/18 0437  NA 140 141  K 4.3 3.9  CL 113* 113*  CO2 18* 19*  GLUCOSE 259* 179*  BUN 25* 25*  CREATININE 1.43* 1.49*  CALCIUM 8.9 8.8*   Lipid Panel:     Component Value Date/Time   CHOL 145 12/02/2018 0352   CHOL 170 05/18/2013 0501   TRIG 105 12/02/2018 0352   TRIG 133 05/18/2013 0501   HDL 46 12/02/2018 0352   HDL 47 05/18/2013 0501   CHOLHDL 3.2 12/02/2018 0352   VLDL 21 12/02/2018 0352   VLDL 27 05/18/2013 0501   LDLCALC 78 12/02/2018 0352   LDLCALC 96 05/18/2013 0501   HgbA1c:  Lab Results  Component Value Date   HGBA1C 9.2 (H) 12/02/2018   Urine Drug Screen:     Component Value Date/Time   LABOPIA NONE DETECTED 12/01/2018 2211   COCAINSCRNUR NONE DETECTED 12/01/2018 2211   LABBENZ NONE DETECTED 12/01/2018 2211   AMPHETMU NONE DETECTED 12/01/2018 2211   THCU NONE DETECTED 12/01/2018 2211   LABBARB NONE DETECTED 12/01/2018 2211    Alcohol Level     Component Value Date/Time   ETH <10 12/01/2018 1900    IMAGING Dg Chest 1 View  Result Date: 12/01/2018 CLINICAL DATA:  Left-sided weakness and bruising following a fall today. EXAM: CHEST  1 VIEW COMPARISON:  07/15/2018. FINDINGS: No significant change in enlargement of the cardiac silhouette.  Interval minimal linear atelectasis or scarring in the left mid lung zone. Otherwise, clear lungs with normal vascularity. No fracture or pneumothorax seen. No pleural fluid. Thoracic spine degenerative changes. IMPRESSION: No acute abnormality. Stable cardiomegaly. Electronically Signed   By: Beckie Salts M.D.   On: 12/01/2018 19:35   Dg Abd 1 View  Result Date: 12/02/2018 CLINICAL DATA:  Feeding tube placement under fluoroscopic guidance EXAM: ABDOMEN - 1 VIEW COMPARISON:  None. FINDINGS: Feeding tube was placed under fluoroscopic guidance. The tip is in the distal duodenum at the ligament of Treitz. IMPRESSION: Feeding tube tip at the ligament of Treitz. Electronically Signed   By: Charlett Nose M.D.   On: 12/02/2018 18:46   Ct Head Wo Contrast  Result Date: 12/01/2018 CLINICAL DATA:  Left-sided weakness. EXAM: CT HEAD WITHOUT CONTRAST TECHNIQUE: Contiguous axial images were obtained from the base of the skull through the vertex without intravenous contrast. COMPARISON:  Head CT 07/15/2018 and MRI 07/16/2018 FINDINGS: Brain: A moderate-sized chronic left MCA infarct is again noted centered in the frontal operculum. Patchy hypodensities are again seen in the cerebral white matter bilaterally, nonspecific but compatible with moderate chronic small vessel ischemic disease. There is the  suggestion of new mild asymmetric hypoattenuation of right parietal white matter with questionably diminished gray-white differentiation (for example series 2, images 18 and 19). No acute intracranial hemorrhage, midline shift, or extra-axial fluid collection is identified. There is mild cerebral atrophy. A 1 cm focus of extra-axial calcification posteriorly over the right frontal convexity is unchanged and may represent a meningioma. Vascular: Calcified atherosclerosis at the skull base. No hyperdense vessel. Skull: No fracture or focal osseous lesion. Sinuses/Orbits: Clear paranasal sinuses. Trace mastoid effusions. Bilateral  cataract extraction. Other: None. IMPRESSION: 1. Possible early acute right parietal infarct. 2. No acute intracranial hemorrhage. 3. Chronic ischemia including an old left MCA infarct. Electronically Signed   By: Sebastian AcheAllen  Grady M.D.   On: 12/01/2018 20:34   Mr Angio Head Wo Contrast  Result Date: 12/02/2018 CLINICAL DATA:  Stroke follow-up. Acute right and chronic left MCA infarcts. EXAM: MRA HEAD WITHOUT CONTRAST TECHNIQUE: Angiographic images of the Circle of Willis were obtained using MRA technique without intravenous contrast. COMPARISON:  07/16/2018 FINDINGS: The study is moderately to severely motion degraded. The visualized distal vertebral arteries are patent to the basilar with the left being mildly dominant. Cerebellar branch vessel evaluation is limited by motion. The basilar artery is patent without evidence of flow limiting stenosis. Both PCAs are patent with chronic moderate right and severe left P2 stenoses, similar to the prior study. The internal carotid arteries are patent from skull base to carotid termini without evidence of flow limiting stenosis. The ACAs and MCAs are patent with similar appearance of mild distal right M1 stenosis. Assessment for MCA branch vessel stenosis is limited by motion artifact which also limits assessment of the ACAs. No large aneurysm is identified. IMPRESSION: 1. Motion degraded examination.  No large vessel occlusion. 2. No evidence of flow limiting proximal anterior circulation stenosis. 3. Moderate right and severe left P2 stenoses. Electronically Signed   By: Sebastian AcheAllen  Grady M.D.   On: 12/02/2018 13:11   Mr Brain Wo Contrast  Result Date: 12/02/2018 CLINICAL DATA:  Stroke workup EXAM: MRI HEAD WITHOUT CONTRAST TECHNIQUE: Multiplanar, multiecho pulse sequences of the brain and surrounding structures were obtained without intravenous contrast. COMPARISON:  Head CT from earlier today FINDINGS: Brain: Large area of acute right MCA territory infarct centered at  the posterior insula and posterior frontal lobe. There is petechial hemorrhage superimposed. Large remote left MCA territory infarct affecting insula and lateral frontal lobe as well. Remote lacunar infarct in the left thalamus and right pons. Ischemic gliosis in the cerebral white matter. No masslike finding or hydrocephalus. Vascular: Grossly preserved flow voids Skull and upper cervical spine: Negative gross marrow lesion Sinuses/Orbits: Bilateral cataract resection Other: Cystic appearing mass in the right parotid measuring 21 mm, stable since 07/16/2018 and likely incidental given comorbidities. Significantly motion degraded study which could obscure pathology. IMPRESSION: 1. Large, acute right MCA branch infarct with petechial hemorrhage. 2. Remote left MCA branch infarct and remote lacunar infarcts in the left thalamus and right pons. 3. Significantly motion degraded study. Electronically Signed   By: Marnee SpringJonathon  Watts M.D.   On: 12/02/2018 05:05   [place] Renal  Result Date: 12/02/2018 CLINICAL DATA:  Acute on chronic renal insufficiency. EXAM: RENAL / URINARY TRACT ULTRASOUND COMPLETE COMPARISON:  CT abdomen and pelvis 09/04/2017. FINDINGS: Right Kidney: Renal measurements: 11.1 x 4.3 x 6.3 cm = volume: 156.7 mL . Echogenicity within normal limits. No mass or hydronephrosis visualized. Left Kidney: Renal measurements: 10.0 x 4.7 x 5.5 cm = volume: 136.4 mL. Echogenicity  within normal limits. No mass or hydronephrosis visualized. Bladder: Appears normal for degree of bladder distention. Other: Small left pleural effusion. IMPRESSION: Negative for hydronephrosis.  The kidneys appear normal. Small left pleural effusion. Electronically Signed   By: Drusilla Kanner M.D.   On: 12/02/2018 12:32   Dg Swallowing Func-speech Pathology  Result Date: 12/02/2018 Objective Swallowing Evaluation: Type of Study: MBS-Modified Barium Swallow Study  Patient Details Name: SKYANNE WELLE MRN: [id number] Date of Birth:  1935/05/04 Today's Date: 12/02/2018 Time: SLP Start Time (ACUTE ONLY): 4098 -SLP Stop Time (ACUTE ONLY): 0932 SLP Time Calculation (min) (ACUTE ONLY): 27 min Past Medical History: Past Medical History: Diagnosis Date . Asthma  . CAD (coronary artery disease)   a. 2000 s/p PCI RCA;  b. 2005 PCI of RCA 2/2 ISR; c. 04/2013 Neg MV;  d. 05/2013 Cath: LM 20, LAD 30p, LCX 20p, OM1 90 small, RCA 82m (PTCA - FFR 0.86), PDA 40. Marland Kitchen Cerebral infarction (HCC)  . Chronic diastolic CHF (congestive heart failure) (HCC)   a. 10/2013 Echo: EF 60-65%, no rwma, Gr1 DD, mild MR, mildly dil LA. . CKD (chronic kidney disease), stage III (HCC)  . DM (diabetes mellitus) (HCC)  . Dyslipidemia  . GERD (gastroesophageal reflux disease)  . Hypertensive heart disease  . Hypothyroidism   hx . Obesity  . Osteoarthritis  . PAF (paroxysmal atrial fibrillation) (HCC)   a. s/p dccv 04/2014;  b. amio/eliquis;  c. 06/2013 bb/ccb d/c 2/2 symptomatic bradycardia; d. 04/2015 recurrent AF->amio increased/bb resumed. . Rheumatoid arthritis(714.0)  . Right rib fracture   a. 04/2015. Marland Kitchen Shoulder fracture, left   a. Dr. Martha Clan Past Surgical History: Past Surgical History: Procedure Laterality Date . CARDIAC CATHETERIZATION  06/11/2013 . cataract surgery   . CORONARY ANGIOPLASTY   . ELECTROPHYSIOLOGIC STUDY N/A 07/25/2015  Procedure: CARDIOVERSION;  Surgeon: Antonieta Iba, MD;  Location: [facility] ORS;  Service: Cardiovascular;  Laterality: N/A; . partial hysterectomy   . RCA stent placement  2000 . REPLACEMENT TOTAL KNEE  2013  right . restenosis with PTCA placement  2005 . SHOULDER SURGERY  02/04/13 HPI: 84 yo female adm to [facility] *transfer* with left sided weakness. Large area of acute right MCA territory infarct centered at the posterior insula and posterior frontal lobe. There is petechial hemorrhage superimposed. Large remote left MCA territory infarct affecting insula and lateral frontal lobe as well. Remote lacunar infarct in the left thalamus and right pons.  Pt did  not pass [facility] swallow screen and thus SLP eval ordered.Pt also with h/o HTN, dyspnea, pna, GERD, UTI, bradycardia, renal fx.  Pt reports she lives alone in a trailer with a bird.  Subjective: pt awake in bed Assessment / Plan / Recommendation CHL IP CLINICAL IMPRESSIONS 12/02/2018 Clinical Impression Patient currently presents with moderately severe oropharyngeal dysphagia with sensorimotor deficits.  Testing was abbreviated due to pt's gross aspiration - therefore only nectar via tsp x2 was tested,  Gross lingual weakness results in grossly impaired = delayed oral transiting.  Pt extends head to aid oral transit which results in premature spillage of boluses into pharynx.  Gross aspiration occured before the swallow as bolus spilled into open airway due to oral discoordination and delay in pharyngeal swallow.  Aspiration followed by delayed weak cough which caused pt mild distress.  Trace residuals in pharynx mixed with retained secretions.  Pt reports some difficulty "catching her breath" after aspiration episode and benefited from verbal encouragement.    Testing was aborted at that time due  to pt obvious discomfort. At this time only recommend pt have oral moisture via toothette after oral care.  Prognosis for swallow function to improve to allow a po diet is good however at this time she is at very high risk for aspiration and malnutrition with current respiratory status.  Recommend consider short term feeding tube to allow for spontaneous healing from current stroke.  Due to silent nature of laryngeal penetration and weak cough, would recommend repeat MBS prior to po initiation.  Pt informed and reports understanding but continued to ask for water.  Recommend oral suction be set up for adequate oral care.  Notified MD of results of testing and recommendations. SLP Visit Diagnosis Dysphagia, oropharyngeal phase (R13.12) Attention and concentration deficit following Cerebral infarction Frontal lobe and executive  function deficit following -- Impact on safety and function Severe aspiration risk;Risk for inadequate nutrition/hydration   CHL IP TREATMENT RECOMMENDATION 12/02/2018 Treatment Recommendations Therapy as outlined in treatment plan below   Prognosis 12/02/2018 Prognosis for Safe Diet Advancement Guarded Barriers to Reach Goals Severity of deficits Barriers/Prognosis Comment -- CHL IP DIET RECOMMENDATION 12/02/2018 SLP Diet Recommendations NPO Liquid Administration via (No Data) Medication Administration -- Compensations -- Postural Changes --   CHL IP OTHER RECOMMENDATIONS 12/02/2018 Recommended Consults -- Oral Care Recommendations Oral care QID Other Recommendations --   CHL IP FOLLOW UP RECOMMENDATIONS 12/02/2018 Follow up Recommendations Skilled Nursing facility   [facility] IP FREQUENCY AND DURATION 12/02/2018 Speech Therapy Frequency (ACUTE ONLY) min 2x/week Treatment Duration 2 weeks      CHL IP ORAL PHASE 12/02/2018 Oral Phase Impaired Oral - Pudding Teaspoon -- Oral - Pudding Cup -- Oral - Honey Teaspoon -- Oral - Honey Cup -- Oral - Nectar Teaspoon Premature spillage;Weak lingual manipulation;Decreased bolus cohesion;Delayed oral transit;Other (Comment);Reduced posterior propulsion Oral - Nectar Cup -- Oral - Nectar Straw -- Oral - Thin Teaspoon -- Oral - Thin Cup -- Oral - Thin Straw -- Oral - Puree -- Oral - Mech Soft -- Oral - Regular -- Oral - Multi-Consistency -- Oral - Pill -- Oral Phase - Comment pt extended head to assist with oral transiting  CHL IP PHARYNGEAL PHASE 12/02/2018 Pharyngeal Phase Impaired Pharyngeal- Pudding Teaspoon -- Pharyngeal -- Pharyngeal- Pudding Cup -- Pharyngeal -- Pharyngeal- Honey Teaspoon -- Pharyngeal -- Pharyngeal- Honey Cup -- Pharyngeal -- Pharyngeal- Nectar Teaspoon Delayed swallow initiation-vallecula;Significant aspiration (Amount);Penetration/Aspiration before swallow;Pharyngeal residue - valleculae;Pharyngeal residue - pyriform Pharyngeal Material enters airway, passes BELOW  cords and not ejected out despite cough attempt by patient;Material enters airway, CONTACTS cords and not ejected out Pharyngeal- Nectar Cup -- Pharyngeal -- Pharyngeal- Nectar Straw -- Pharyngeal -- Pharyngeal- Thin Teaspoon -- Pharyngeal -- Pharyngeal- Thin Cup -- Pharyngeal -- Pharyngeal- Thin Straw -- Pharyngeal -- Pharyngeal- Puree -- Pharyngeal -- Pharyngeal- Mechanical Soft -- Pharyngeal -- Pharyngeal- Regular -- Pharyngeal -- Pharyngeal- Multi-consistency -- Pharyngeal -- Pharyngeal- Pill -- Pharyngeal -- Pharyngeal Comment laryngeal penetration with first bolus of nectar (tsp), chin tuck posture tested next tsp bolus which pt was unable to perform due to lingual weakness thus resulting in gross aspiration before the swallow with overt coughing and pt reporting some difficulty "catching her breath"  CHL IP CERVICAL ESOPHAGEAL PHASE 12/02/2018 Cervical Esophageal Phase WFL Pudding Teaspoon -- Pudding Cup -- Honey Teaspoon -- Honey Cup -- Nectar Teaspoon -- Nectar Cup -- Nectar Straw -- Thin Teaspoon -- Thin Cup -- Thin Straw -- Puree -- Mechanical Soft -- Regular -- Multi-consistency -- Pill -- Cervical Esophageal Comment -- Luanna Salk  Ann 12/02/2018, 2:11 PM  Donavan Burnet, MS [facility] SLP Acute Rehab Services Pager [phone] Office [phone]             Dg Hip Lucienne Capers Or Wo Pelvis 2-3 Views Left  Result Date: 12/01/2018 CLINICAL DATA:  Left-sided weakness and bruising following a fall today. EXAM: DG HIP (WITH OR WITHOUT PELVIS) 2-3V LEFT COMPARISON:  10/23/2015. FINDINGS: Interval right hip prosthesis. Diffuse osteopenia. No fracture or dislocation seen. Lower lumbar spine degenerative changes. Extensive arterial calcifications. IMPRESSION: No acute abnormality. Electronically Signed   By: Beckie Salts M.D.   On: 12/01/2018 19:36   Vas US Carotid (at [facility])  Result Date: 12/02/2018 Carotid Arterial Duplex Study Indications:       CVA, New left sided weakness and History of left MCA  stroke. Risk Factors:      Hypertension, hyperlipidemia, Diabetes, coronary artery                    disease, prior CVA. Other Factors:     Chronic kidney disease, Paroxysmal A-Fib, obesity. Limitations:       High bifurcations, tortuosity, constant turning of the head,                    and vocal interference caused by snoring Comparison Study:  10/22/2013 Rt 40 to 59%, Lt 1 to 39 % Performing Technologist: [employer]  Examination Guidelines: A complete evaluation includes B-mode imaging, spectral Doppler, color Doppler, and power Doppler as needed of all accessible portions of each vessel. Bilateral testing is considered an integral part of a complete examination. Limited examinations for reoccurring indications may be performed as noted.  Right Carotid Findings: +----------+--------+--------+--------+---------------------+------------------+           PSV cm/sEDV cm/sStenosisDescribe             Comments           +----------+--------+--------+--------+---------------------+------------------+ CCA Prox                                               mild intimal wall                                                         changes            +----------+--------+--------+--------+---------------------+------------------+ CCA Distal45      3                                    mild intimal wall                                                         changes            +----------+--------+--------+--------+---------------------+------------------+ ICA Prox  142     25      1-39%   heterogenous and     oderate plaque  irregular            with acoustic                                                             shadowing          +----------+--------+--------+--------+---------------------+------------------+ ICA Mid   62      11                                   tortuous            +----------+--------+--------+--------+---------------------+------------------+ ICA Distal67      17                                   tortuous           +----------+--------+--------+--------+---------------------+------------------+ ECA       132     5               irregular and        moderate plaque                                      heterogenous                            +----------+--------+--------+--------+---------------------+------------------+ +----------+--------+-------+--------+-------------------+           PSV cm/sEDV cmsDescribeArm Pressure (mmHG) +----------+--------+-------+--------+-------------------+ [id number]                                         +----------+--------+-------+--------+-------------------+ +---------+--------+--+--------+-+ VertebralPSV cm/s34EDV cm/s6 +---------+--------+--+--------+-+ Acoustic shadowing may obscure higher celocities  Left Carotid Findings: +----------+--------+--------+--------+--------------------+-------------------+           PSV cm/sEDV cm/sStenosisDescribe            Comments            +----------+--------+--------+--------+--------------------+-------------------+ CCA Prox  55      8                                                       +----------+--------+--------+--------+--------------------+-------------------+ CCA Distal64      6                                   mild intimal wall                                                         changes             +----------+--------+--------+--------+--------------------+-------------------+ ICA Prox  134  35      1-39%                       difficult to image                                                        due to limitations  +----------+--------+--------+--------+--------------------+-------------------+ ICA Mid   83      7                                   tortuous             +----------+--------+--------+--------+--------------------+-------------------+ ICA Distal102     13                                  tortuous            +----------+--------+--------+--------+--------------------+-------------------+ ECA       111     14              heterogenous and    mild plaque                                           irregular                               +----------+--------+--------+--------+--------------------+-------------------+ +----------+--------+--------+--------+-------------------+ SubclavianPSV cm/sEDV cm/sDescribeArm Pressure (mmHG) +----------+--------+--------+--------+-------------------+           74      5                                   +----------+--------+--------+--------+-------------------+ +---------+--------+--+--------+ VertebralPSV cm/s38EDV cm/s +---------+--------+--+--------+ Acoustic shadowing may obscure higher velocities.  Summary: Right Carotid: Velocities in the right ICA are consistent with a 1-39% stenosis.                See technical comments listed above. Left Carotid: Velocities in the left ICA are consistent with a 1-39% stenosis.               See technical comments listed above. Vertebrals:  Bilateral vertebral arteries demonstrate antegrade flow. Subclavians: Normal flow hemodynamics were seen in bilateral subclavian              arteries. *See table(s) above for measurements and observations.     Preliminary     PHYSICAL EXAM  Temp:  [98.2 F (36.8 C)-99.2 F (37.3 C)] 98.8 F (37.1 C) (07/16 1100) Pulse Rate:  [62-78] 68 (07/16 1100) Resp:  [18-23] 23 (07/16 1100) BP: (118-209)/(65-100) 120/82 (07/16 1100) SpO2:  [95 %-100 %] 96 % (07/16 1100)  General - Well nourished, well developed, in mild respiratory distress.  Ophthalmologic - fundi not visualized due to noncooperation.  Cardiovascular - irregularly irregular heart rate and rhythm.  Neuro - lethargic, mild SOB with gurgling  sound and increased secretion, drowsy but arousable, orientated to self and age, but did not answer other orientation questions.  Severe dysarthria,  follow limited commands.  Left facial droop, PERRLA, right gaze preference.  Left hemianopia, left neglect.  Tongue midline.  Left arm 2+/5, not against gravity.  Right arm 4/5.  However bilateral lower extremity 2/5 on pain stimulation.  Bilateral Babinski positive.  Sensation symmetrical, coordination not corporative and gait not tested.    ASSESSMENT/PLAN Ms. Dennie BibleMarjorie E Wenger is a 84 y.o. female with history of atrial fibrillation on Eliquis, prior L MCA stroke presenting with new onset left-sided weakness after being found unresponsive at home.  Reported history of headache, fatigue and diarrhea x1 week.  Patient reported a fall at time of onset..   Stroke:   R MCA branch infarct embolic secondary to known atrial fibrillation even on Eliquis (not quit sure about compliance)  CT head possible early R parietal infarct.  No ICH.  Old L MCA infarct.  MRI large R MCA branch infarct with petechial hemorrhage.  Old L MCA infarct.  Old L thalamic lacune.  Old R pontine lacune.  MRA  No LVO, no flow limiting anterior stenoses. Mod R and severe L P2 stenosis.   Carotid Doppler  B ICA 1-39% stenosis, VAs antegrade   2D Echo EF 55 to 60%  LDL 78  HgbA1c 9.2  Lovenox 30 mg subcu daily for VTE prophylaxis  aspirin 81 mg daily and Eliquis (apixaban) daily prior to admission, now on aspirin 300 mg suppository daily. Consider change to pradaxa 5 days post stroke (12/06/18) to avoid hemorrhagic conversion if pt remains stable.   Therapy recommendations:  SNF  Disposition:  pending   Atrial Fibrillation / Atrial Flutter  Home anticoagulation:  Eliquis (apixaban) daily   Rate controlled  Patient admitted that sometimes she runs out of Eliquis  Eliquis on hold in hospital given risk of hemorrhagic conversion from large stroke   Consider change to  pradaxa 5 days post stroke (12/06/18) if remains stable.   Hx stroke/TIA  10/2013 - admitted for aphasia and right-sided weakness MRI showed L MCA Stroke, etiology likely due to atrial flutter not on anticoagulation, started on Eliquis.  LDL 67 and A1c 7.7.  Also discharged on Lipitor 20  Patient also take aspirin 81 at home  Hypertension  Stable . Permissive hypertension (OK if <180/105) but gradually normalize in 3-5 days . Long-term BP goal normotensive  Hyperlipidemia  Home meds: Lipitor 20  now on Lipitor 40 in hospital  LDL 78, goal < 70  Continue statin at discharge  Diabetes type II Uncontrolled  HgbA1c 9.2, goal < 7.0  CBGs  SSI  DM coordinator consultation  close PCP follow-up after discharge  Dysphagia . Secondary to stroke . NPO . Cortrak w/ TFs . Speech on board   Other Stroke Risk Factors  Advanced age  Obesity, Body mass index is 34.37 kg/m., recommend weight loss, diet and exercise as appropriate   Coronary artery disease s/p PCI, stent  Chronic diastolic Congestive heart failure  Other Active Problems  CKD stage III  GERD  RA  Hospital day # 2  Neurology will sign off. Please call with questions. [facility]. Thanks for the consult.   Marvel PlanJindong Marcas Bowsher, MD PhD Stroke Neurology 12/03/2018 2:28 PM  To contact Stroke Continuity provider, please refer to [email or web address]. After hours, contact General Neurology

## 2018-12-03 NOTE — Progress Notes (Addendum)
   Subjective: Pt seen on AM rounds today. Difficult to awaken and says she feels tired right now. Dysarthric speech. Difficult to understand everything she is saying, but she says she feels okay. Didn't sleep well last night. No pain this AM. Oriented x3.  Objective:  Vital signs in last 24 hours: Vitals:   12/02/18 1516 12/02/18 1941 12/02/18 2342 12/03/18 0336  BP: 126/65 (!) 209/100 (!) 184/94 (!) 178/83  Pulse: 69 78 63 62  Resp: 18 (!) 21 20 20   Temp: 98.2 F (36.8 C) 98.5 F (36.9 C) 99.2 F (37.3 C) 98.8 F (37.1 C)  TempSrc: Oral Oral Oral Oral  SpO2: 98% 95% 97% 100%  Weight:      Height:       Physical Exam Constitutional:      Comments: Sleeping on approach. Wakes up for exam.   HENT:     Head: Normocephalic and atraumatic.  Cardiovascular:     Rate and Rhythm: Normal rate and regular rhythm.  Pulmonary:     Effort: Pulmonary effort is normal.     Breath sounds: Normal breath sounds.  Neurological:     Mental Status: She is oriented to person, place, and time.     Cranial Nerves: Cranial nerve deficit present.     Motor: Weakness present.     Comments: Dysarthria. L.Side neglect. Weaker on L than R. Able to move all 4 extremities against gravity.       Assessment/Plan:  Active Problems:   CVA (cerebral vascular accident) ([place])   Acute on chronic renal insufficiency   Hyperglycemia  84 year old female with a significant PMH of a fib, diabetes, CKD stage III, diastolic CHF, CAD, and left MCA stroke who presented for weakness after having a fall and was found to have a probable R parietal infarct.  Active Problems: CVA (cerebral vascular accident)  R parietal infarct correlates with pt's exam findings of left hemi-neglect and most likely due to pt's a fib and being off anticoagulation for the past month. - Appreciate Neurology's recommendations - Right MCA infarct on MRI with petechial hemorrhage, will not pursue treatment dose anticoagulation for afib  - SLP: NPO > c/w tube feeds - PT/OT Evals - A1c Elevated to 9.2 - Lipid panel WNL - Neuro checks  AKI: Likely 2/2 dehydration given 1 week of diarrhea.  Cr.1.69 on admit (baseline 0.8, 2017) - Cr 1.49 on 7/16 - IVF, 44mL/hr NS - I/Os, Daily Wts (Hx CHF) - AM CMP  Afib: NPO, holding home meds. No treatment dose anticoagulation due petechiae on MRI  Diabetes:SSI-M q4h History of Falls: PT/OT, Fall Precautions Hypothyroidism:  levothyroxine IV 117mcg  Diet - NPO, Tube Feeds when tube placed Fluids - 32mL/hr NS DVT ppx- enoxaparin 30mg  subQ daily CODE STATUS - FULL CODE   Dispo: Anticipated discharge in approximately 3-4 day(s).   [name], MD PGY1  [phone]

## 2018-12-04 ENCOUNTER — Inpatient Hospital Stay (HOSPITAL_COMMUNITY): Payer: Medicare HMO

## 2018-12-04 DIAGNOSIS — I63511 Cerebral infarction due to unspecified occlusion or stenosis of right middle cerebral artery: Secondary | ICD-10-CM

## 2018-12-04 LAB — GLUCOSE, CAPILLARY
Glucose-Capillary: 272 mg/dL — ABNORMAL HIGH (ref 70–99)
Glucose-Capillary: 350 mg/dL — ABNORMAL HIGH (ref 70–99)
Glucose-Capillary: 278 mg/dL — ABNORMAL HIGH (ref 70–99)
Glucose-Capillary: 163 mg/dL — ABNORMAL HIGH (ref 70–99)
Glucose-Capillary: 251 mg/dL — ABNORMAL HIGH (ref 70–99)

## 2018-12-04 LAB — BASIC METABOLIC PANEL
Anion gap: 6 (ref 5–15)
BUN: 28 mg/dL — ABNORMAL HIGH (ref 8–23)
CO2: 19 mmol/L — ABNORMAL LOW (ref 22–32)
Calcium: 8.6 mg/dL — ABNORMAL LOW (ref 8.9–10.3)
Chloride: 116 mmol/L — ABNORMAL HIGH (ref 98–111)
Creatinine, Ser: 1.53 mg/dL — ABNORMAL HIGH (ref 0.44–1.00)
GFR calc Af Amer: 37 mL/min — ABNORMAL LOW (ref >60)
GFR calc non Af Amer: 32 mL/min — ABNORMAL LOW (ref >60)
Glucose, Bld: 365 mg/dL — ABNORMAL HIGH (ref 70–99)
Potassium: 3.9 mmol/L (ref 3.5–5.1)
Sodium: 141 mmol/L (ref 135–145)

## 2018-12-04 MED ORDER — AMLODIPINE 1 MG/ML ORAL SUSPENSION: 10.0000 mg | Freq: Every day | ORAL | Status: DC

## 2018-12-04 MED ORDER — INSULIN GLARGINE 100 UNIT/ML ~~LOC~~ SOLN
10.0000 [IU] | Freq: Every day | SUBCUTANEOUS | Status: DC
  Administered 2018-12-04 – 2018-12-06 (×3): 10 [IU] via SUBCUTANEOUS
  Filled 2018-12-04 (×3): qty 0.1

## 2018-12-04 MED ORDER — AMLODIPINE BESYLATE 10 MG PO TABS
10.0000 mg | ORAL_TABLET | Freq: Every day | ORAL | Status: DC
  Administered 2018-12-04 – 2018-12-07 (×4): 10 mg
  Filled 2018-12-04 (×4): qty 1

## 2018-12-04 NOTE — Progress Notes (Signed)
   Subjective: [name] [name] seen on rounds this AM. Dysarthric but oriented x3. Says she is having more bowel movements. No pain today. Discussed PEG tube placement, and that NG tube is not long term option for SNF. She is okay with PEG placement.   Spoke by phone with patient's daughter [name] [name].  Phone number [phone].  She knows her mother had a stroke and is recovering while we optimize medical management.  Told mom will have PEG tube placed.  Objective:  Vital signs in last 24 hours: Vitals:   12/04/18 0354 12/04/18 0401 12/04/18 0745 12/04/18 0813  BP: (!) 198/71 (!) 198/71 (!) 179/71   Pulse: 72 (!) 59 (!) 54 72  Resp: (!) 21 (!) 21 (!) 24   Temp: 98 F (36.7 C) 98 F (36.7 C) 98.4 F (36.9 C)   TempSrc: Oral  Oral   SpO2: 100% 97% 99%   Weight:      Height:       Physical Exam Constitutional:      General: She is not in acute distress. HENT:     Head:     Comments: Ecchymosis on left forehead    Mouth/Throat:     Mouth: Mucous membranes are dry.  Eyes:     General: No scleral icterus.    Extraocular Movements: Extraocular movements intact.     Pupils: Pupils are equal, round, and reactive to light.  Cardiovascular:     Rate and Rhythm: Normal rate. Rhythm irregular.     Comments: Irregular irregular Abdominal:     General: Bowel sounds are normal.     Palpations: Abdomen is soft.  Neurological:     Comments: Mental Status: Patient is awake, alert, oriented x3 aphasia or L hemi neglect Cranial Nerves: II: Pupils equal, round, and reactive to light.   III,IV, VI: EOMI without ptosis or diploplia.  V: Facial sensation is symmetric to light touch and  Temperature. VII: Facial movement is symmetric.  VIII: hearing is intact to voice X: Unable to access due to patients limitations  XI: Turns head against hand , equal bilaterally   XII: tongue is midline , unable to move   Motor:  4/5 RUE, 2/5 LUE  , 2/5 bilateral lower extremitiy  Sensory: Sensation is  grossly intact to light touch bilateral UEs & LEs     Assessment/Plan:  Active Problems:   CVA (cerebral vascular accident) ([place])   Acute on chronic renal insufficiency   Hyperglycemia   Weak  84 year old female with a significant PMH of a fib, diabetes, CKD stage III, diastolic CHF, CAD, and left MCA stroke who presented for weakness after having a fall and was found to have a probable R MCA infarct.   Active Problems: CVA  Right MCA infarct in setting of A. fib not taking Eliquis due to cost barrier.-A1cElevated to 9.2 -Lipid panelWNL.  Plans to discharge SNF after after PEG placement. - AppreciateNeurology'srecommendations - SLP: NPO , c/wtube feeds.  - PT/OT Evals - IR PEG tube -Aspirin 325 -Neuro checks  AKI: Likely2/2dehydration given1 week of diarrhea. Cr.1.69 on admit (baseline 0.8,2017)Cr 1.53 on 7/16 Afib: NPO, holding home meds. No treatment dose anticoagulation due petechiae on MRI Diabetes:SSI-M q4h History of Falls:PT/OT, Fall Precautions Hypothyroidism:levothyroxine IV 162mcg  Diet - NPO, Tube Feeds  DVT ppx- enoxaparin 30mg  subQ daily CODE STATUS - FULL CODE   Dispo: Anticipated discharge in approximately 1-2 days  [name], MD PGY1  [phone]

## 2018-12-04 NOTE — Progress Notes (Signed)
Speech Language Pathology Treatment: Dysphagia  Patient Details Name: [name] [name] MRN: [id number] DOB: 05-23-1934 Today's Date: 12/04/2018 Time: 0825-0905 SLP Time Calculation (min) (ACUTE ONLY): 40 min  Assessment / Plan / Recommendation Clinical Impression  Pt currently presents with continued decreased sustained attention impacting her swallow initiation.  She is repeatedly asking for water and coffee.   Aggressive oral care required to clear viscous yellow tinged secretions that are adhered to hard and soft palate.  Pt will be aspiration risk from secretions if oral care not properly provided.    She was willing to consume yogurt and honey thick cranberry juice.   Her swallow initiation is delayed up to 7 seconds per bolus even with verbal cues to swallow.   She continues to demonstrate immediate cough with oral moisture from washcloth and toothette - which is likely indicating aspiration given findings from recent MBS showing aspiration producing cough.  Max verbal/tactile cues also required for pt initiate swallow with 2/8 swallows as pt was verbose with decreased sustained attention.   Pt expresses desire for coffee and states she has had problems swallowing for a "long time".  Advised her that with her new CVA that her dysphagia, vocal and cough weakness are likely worse - which she now confirms.    Note pt agreeable to consider PEG at this time, however she also verbalizing desire for intake.  With pt's severely delayed swallow response and weak cough - she is at high risk of aspiration pneumonia with po intake.  Highly recommend consider a palliative consult given her clear verbalized desire for po intake and premorbid deficits.  PEG tube will not keep her from aspirating but will provide with her nutritional support.  SLP is hopeful for improved swallow function but doubtful to occur during his acute hospital stay.  Highly recommend to consider palliative referral to help pt establish  goals given her h/o dysphagia, multiple comorbitidies and reported desire to consume po.    Pt's moderately severe dysarthria continues and she became emotional which decreased her ability to follow directions to slow rate and over-articulate.  When she is able to focus her articulation her intelligibility increases to approximately 80% for phrase level.   Recommend aggressive oral care and allowing pt 1/2 tsp of water sitting fully upright from RN.      HPI HPI: 84 yo female adm to [facility] *transfer* with left sided weakness. Large area of acute right MCA territory infarct centered at the posterior insula and posterior frontal lobe. There is petechial hemorrhage superimposed. Large remote left MCA territory infarct affecting insula and lateral frontal lobe as well. Remote lacunar infarct in the left thalamus and right pons.  Pt did not pass [facility] swallow screen and thus SLP eval ordered.Pt also with h/o HTN, dyspnea, pna, GERD, UTI, bradycardia, renal fx.  Pt reports she lives alone in a trailer with a bird.      SLP Plan  Continue with current plan of care       Recommendations  Diet recommendations: Other(comment)(if desires po with increased risks - rec dys1/honey OR continue NPO - x oral moisture and 1/2 tsp of water with Tubefeeding continuing) Postural Changes and/or Swallow Maneuvers: Seated upright 90 degrees;Upright 30-60 min after meal                Oral Care Recommendations: Oral care QID Follow up Recommendations: Skilled Nursing facility SLP Visit Diagnosis: Dysphagia, oropharyngeal phase (R13.12) Attention and concentration deficit following: Cerebral infarction Plan: Continue with  current plan of care       McLennan, [place] [facility] SLP Acute Rehab Services Pager [phone] Office [phone]  Macario Golds 12/04/2018, 10:13 AM

## 2018-12-04 NOTE — Progress Notes (Signed)
Cortrak Tube Team Note:  Consult received to place a Cortrak feeding tube. Cortrak placed by diagnostic radiology on 7/15 in the R nare and was secured with tape. RD replaced tape with bridle and secured tube at 110 cm marking.   If the tube becomes dislodged please keep the tube and contact the Cortrak team at [email or web address] (password [email or web address]) for replacement.  If after hours and replacement cannot be delayed, place a NG tube and confirm placement with an abdominal x-ray.    Mariana Single RD, LDN Clinical Nutrition Pager # [phone]

## 2018-12-04 NOTE — Progress Notes (Signed)
  Date: 12/04/2018  Patient name: [name] [name]  Medical record number: [id number]  Date of birth: November 02, 1934   I have seen and evaluated this patient and I have discussed the plan of care with the house staff. Please see Dr. Lonzo Candy note for complete details. I concur with his findings and plan.    Sid Falcon, MD 12/04/2018, 6:49 PM

## 2018-12-04 NOTE — Care Management Important Message (Signed)
Important Message  Patient Details  Name: [name] [name] MRN: [id number] Date of Birth: January 24, 1935   Medicare Important Message Given:  Yes     Shelda Altes 12/04/2018, 3:23 PM

## 2018-12-04 NOTE — Progress Notes (Signed)
   12/04/18 0400  Provider Notification  Provider Name/Title Dr Gilford Rile  Date Provider Notified 12/04/18  Time Provider Notified [phone]  Notification Type Page  Notification Reason Other (Comment) (Pt's BP is 198/71)  Response No new orders  Date of Provider Response 12/04/18  Time of Provider Response 0430

## 2018-12-04 NOTE — Progress Notes (Signed)
Physical Therapy Treatment Patient Details Name: [name] [name] MRN: [id number] DOB: May 07, 1935 Today's Date: 12/04/2018    History of Present Illness Pt is an 84 y/o female who presents with stroke-like symptoms. MRI revealed large, acute R MCA infarct with petechial hemorrhage, as well as remote L MCA infarct and remote lacunar infarcts in L thalamus and R pons. PMH significant for L shoulder fracture s/p surgery 2014, RA, PAF, OA, hypothyroidism, DM, CKD, CHF, cerebral infarction, CAD, R TKR.    PT Comments    Patient seen for mobility progression. Pt is making progress toward PT goals and tolerated OOB mobility well with min/mod A +2 for safety. Continue to progress as tolerated.    Follow Up Recommendations  SNF;Supervision/Assistance - 24 hour     Equipment Recommendations  None recommended by PT    Recommendations for Other Services       Precautions / Restrictions Precautions Precautions: Fall Precaution Comments: L inattention, monitor O2 sats Restrictions Weight Bearing Restrictions: No    Mobility  Bed Mobility Overal bed mobility: Needs Assistance Bed Mobility: Rolling;Sidelying to Sit;Sit to Sidelying Rolling: Mod assist Sidelying to sit: Mod assist       General bed mobility comments: cues for sequencing and technique; assist with bed pad to bring hips to EOB and to assis to elevate trunk into sitting   Transfers Overall transfer level: Needs assistance Equipment used: Rolling walker (2 wheeled) Transfers: Sit to/from [facility] Sit to Stand: Mod assist;+2 physical assistance;+2 safety/equipment;Min assist Stand pivot transfers: Mod assist;+2 physical assistance;+2 safety/equipment       General transfer comment: cues for safe hand placement and sequencing when pivoting EOB to recliner; intiail stand pt required mod A +2 to power up into standing and then next 2 trials min A +2 to power up; stood multiple times due to needing to be  cleaned up; mod A +2 to guide RW and to steady while pivoting  Ambulation/Gait                 Stairs             Wheelchair Mobility    Modified Rankin (Stroke Patients Only)       Balance Overall balance assessment: Needs assistance Sitting-balance support: Single extremity supported;Feet supported Sitting balance-Leahy Scale: Poor Sitting balance - Comments: assistance required to maintain static balance, with pt fluctuating between min guard and mod A Postural control: Posterior lean;Left lateral lean Standing balance support: Bilateral upper extremity supported;During functional activity Standing balance-Leahy Scale: Poor Standing balance comment: mod A+2 during several steps to recliner                             Cognition Arousal/Alertness: Awake/alert Behavior During Therapy: Flat affect Overall Cognitive Status: Impaired/Different from baseline Area of Impairment: Following commands;Safety/judgement;Problem solving                 Orientation Level: Disoriented to;Situation     Following Commands: Follows one step commands inconsistently;Follows one step commands with increased time Safety/Judgement: Decreased awareness of deficits Awareness: Intellectual Problem Solving: Slow processing;Decreased initiation;Difficulty sequencing;Requires verbal cues;Requires tactile cues General Comments: pt repeating "hello?" into remote control      Exercises      General Comments General comments (skin integrity, edema, etc.): Pt on 2L O2 via [place] throughout session, cortrak in place      Pertinent Vitals/Pain Pain Assessment: No/denies pain    Home Living  Prior Function            PT Goals (current goals can now be found in the care plan section) Acute Rehab PT Goals Patient Stated Goal: None stated Progress towards PT goals: Progressing toward goals    Frequency    Min 4X/week      PT Plan  Current plan remains appropriate    Co-evaluation PT/OT/SLP Co-Evaluation/Treatment: Yes Reason for Co-Treatment: For patient/therapist safety;To address functional/ADL transfers;Complexity of the patient's impairments (multi-system involvement) PT goals addressed during session: Mobility/safety with mobility;Balance OT goals addressed during session: ADL's and self-care      AM-PAC PT "6 Clicks" Mobility   Outcome Measure  Help needed turning from your back to your side while in a flat bed without using bedrails?: A Lot Help needed moving from lying on your back to sitting on the side of a flat bed without using bedrails?: A Lot Help needed moving to and from a bed to a chair (including a wheelchair)?: A Lot Help needed standing up from a chair using your arms (e.g., wheelchair or bedside chair)?: A Lot Help needed to walk in hospital room?: A Lot Help needed climbing 3-5 steps with a railing? : Total 6 Click Score: 11    End of Session Equipment Utilized During Treatment: Gait belt;Oxygen Activity Tolerance: Patient tolerated treatment well Patient left: in chair;with call bell/phone within reach;with chair alarm set Nurse Communication: Mobility status PT Visit Diagnosis: Other symptoms and signs involving the nervous system (R29.898)     Time: 1340-1410 PT Time Calculation (min) (ACUTE ONLY): 30 min  Charges:  $Gait Training: 8-22 mins                     [name] [name], PTA Acute Rehabilitation Services Pager: [phone] Office: [phone]     [name] [name] 12/04/2018, 3:38 PM

## 2018-12-04 NOTE — Progress Notes (Signed)
Results for [name], [name] (MRN [id number]) as of 12/04/2018 09:20  Ref. Range 12/03/2018 15:55 12/03/2018 19:57 12/03/2018 23:48 12/04/2018 03:55 12/04/2018 07:35  Glucose-Capillary Latest Ref Range: 70 - 99 mg/dL 228 (H) 243 (H) 235 (H) 272 (H) 350 (H)  Noted that blood sugars continue to be greater than 200 mg/dl.   Recommend starting Novolog 4 units every 4 hours as tube feed coverage if blood sugars continue to be elevated. Continue Novolog MODERATE correction scale every 4 hours as ordered.   Harvel Ricks RN BSN CDE Diabetes Coordinator Pager: [phone]  8am-5pm

## 2018-12-04 NOTE — Progress Notes (Signed)
Occupational Therapy Treatment Patient Details Name: ADRIELLE POLAKOWSKI MRN: [id number] DOB: 1934-11-07 Today's Date: 12/04/2018    History of present illness Pt is an 84 y/o female who presents with stroke-like symptoms. MRI revealed large, acute R MCA infarct with petechial hemorrhage, as well as remote L MCA infarct and remote lacunar infarcts in L thalamus and R pons. PMH significant for L shoulder fracture s/p surgery 2014, RA, PAF, OA, hypothyroidism, DM, CKD, CHF, cerebral infarction, CAD, R TKR.   OT comments  Pt demonstrated improvement in alertness and command following. Pt completed bed mobility with mod A to EOB, with cueing required for sequencing moving LE off bed. Pt completed SPT with mod +2 assistance. Pt alert and oriented this session however still demonstrating confusion overall. SNF remains appropriate d/c.    Follow Up Recommendations  SNF;Supervision/Assistance - 24 hour    Equipment Recommendations  None recommended by OT       Precautions / Restrictions Precautions Precautions: Fall Precaution Comments: L inattention, monitor O2 sats Restrictions Weight Bearing Restrictions: No       Mobility Bed Mobility Overal bed mobility: Needs Assistance Bed Mobility: Rolling;Sidelying to Sit;Sit to Sidelying Rolling: Mod assist Sidelying to sit: Max assist       General bed mobility comments: cues for sequencing and technique; assist with bed pad to bring hips to EOB and to assis to elevate trunk into sitting   Transfers Overall transfer level: Needs assistance Equipment used: Rolling walker (2 wheeled) Transfers: Sit to/from [facility] Sit to Stand: Mod assist;+2 physical assistance;+2 safety/equipment Stand pivot transfers: Mod assist;+2 physical assistance;+2 safety/equipment       General transfer comment: Pt required cueing for sequencing transfer    Balance Overall balance assessment: Needs assistance Sitting-balance support: Single  extremity supported;Feet supported Sitting balance-Leahy Scale: Poor Sitting balance - Comments: assistance required to maintain static balance, with pt fluctuating between min guard and mod A Postural control: Posterior lean;Left lateral lean Standing balance support: Bilateral upper extremity supported;During functional activity Standing balance-Leahy Scale: Poor Standing balance comment: mod A+2 during several steps to recliner                            ADL either performed or assessed with clinical judgement   ADL Overall ADL's : Needs assistance/impaired             Lower Body Bathing: +2 for physical assistance;Cueing for safety;Maximal assistance;Sit to/from stand                       Functional mobility during ADLs: Moderate assistance;+2 for physical assistance;+2 for safety/equipment;Rolling walker General ADL Comments: Pt completed 3x sit <> stands with mod +2. Pt was incontinent of BM multiple times                Cognition Arousal/Alertness: Awake/alert Behavior During Therapy: Flat affect Overall Cognitive Status: Impaired/Different from baseline Area of Impairment: Following commands;Safety/judgement;Problem solving                 Orientation Level: Disoriented to;Situation     Following Commands: Follows one step commands inconsistently;Follows one step commands with increased time Safety/Judgement: Decreased awareness of deficits Awareness: Intellectual Problem Solving: Slow processing;Decreased initiation;Difficulty sequencing;Requires verbal cues;Requires tactile cues                General Comments Pt on 2L O2 via [place] throughout session, cortrak in place  Pertinent Vitals/ Pain       Pain Assessment: No/denies pain     Prior Functioning/Environment              Frequency  Min 2X/week        Progress Toward Goals  OT Goals(current goals can now be found in the care plan section)  Progress towards  OT goals: Progressing toward goals  Acute Rehab OT Goals Patient Stated Goal: None stated OT Goal Formulation: With patient Time For Goal Achievement: 12/16/18 Potential to Achieve Goals: Good  Plan Discharge plan remains appropriate    Co-evaluation    PT/OT/SLP Co-Evaluation/Treatment: Yes Reason for Co-Treatment: To address functional/ADL transfers;Complexity of the patient's impairments (multi-system involvement)   OT goals addressed during session: ADL's and self-care      AM-PAC OT "6 Clicks" Daily Activity     Outcome Measure   Help from another person eating meals?: Total Help from another person taking care of personal grooming?: A Little Help from another person toileting, which includes using toliet, bedpan, or urinal?: A Lot Help from another person bathing (including washing, rinsing, drying)?: A Lot Help from another person to put on and taking off regular upper body clothing?: A Lot Help from another person to put on and taking off regular lower body clothing?: A Lot 6 Click Score: 12    End of Session Equipment Utilized During Treatment: Gait belt;Oxygen  OT Visit Diagnosis: Unsteadiness on feet (R26.81);Repeated falls (R29.6);Muscle weakness (generalized) (M62.81);History of falling (Z91.81)   Activity Tolerance Patient tolerated treatment well   Patient Left in chair;with call bell/phone within reach;with chair alarm set   Nurse Communication Mobility status        Time: 1340-1411 OT Time Calculation (min): 31 min  Charges: OT General Charges $OT Visit: 1 Visit OT Treatments $Self Care/Home Management : 8-22 mins   Crissie Reese OTR/L  12/04/2018, 3:08 PM
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
## 2018-12-05 LAB — GLUCOSE, CAPILLARY
Glucose-Capillary: 131 mg/dL — ABNORMAL HIGH (ref 70–99)
Glucose-Capillary: 189 mg/dL — ABNORMAL HIGH (ref 70–99)
Glucose-Capillary: 202 mg/dL — ABNORMAL HIGH (ref 70–99)
Glucose-Capillary: 242 mg/dL — ABNORMAL HIGH (ref 70–99)
Glucose-Capillary: 254 mg/dL — ABNORMAL HIGH (ref 70–99)
Glucose-Capillary: 286 mg/dL — ABNORMAL HIGH (ref 70–99)
Glucose-Capillary: 288 mg/dL — ABNORMAL HIGH (ref 70–99)
Glucose-Capillary: 297 mg/dL — ABNORMAL HIGH (ref 70–99)

## 2018-12-05 LAB — CBC
HCT: 30.9 % — ABNORMAL LOW (ref 36.0–46.0)
Hemoglobin: 10 g/dL — ABNORMAL LOW (ref 12.0–15.0)
MCH: 30 pg (ref 26.0–34.0)
MCHC: 32.4 g/dL (ref 30.0–36.0)
MCV: 92.8 fL (ref 80.0–100.0)
Platelets: 226 10*3/uL (ref 150–400)
RBC: 3.33 MIL/uL — ABNORMAL LOW (ref 3.87–5.11)
RDW: 13.2 % (ref 11.5–15.5)
WBC: 9.7 10*3/uL (ref 4.0–10.5)
nRBC: 0 % (ref 0.0–0.2)

## 2018-12-05 LAB — BASIC METABOLIC PANEL
Anion gap: 9 (ref 5–15)
BUN: 29 mg/dL — ABNORMAL HIGH (ref 8–23)
CO2: 20 mmol/L — ABNORMAL LOW (ref 22–32)
Calcium: 8.7 mg/dL — ABNORMAL LOW (ref 8.9–10.3)
Chloride: 114 mmol/L — ABNORMAL HIGH (ref 98–111)
Creatinine, Ser: 1.47 mg/dL — ABNORMAL HIGH (ref 0.44–1.00)
GFR calc Af Amer: 38 mL/min — ABNORMAL LOW (ref >60)
GFR calc non Af Amer: 33 mL/min — ABNORMAL LOW (ref >60)
Glucose, Bld: 292 mg/dL — ABNORMAL HIGH (ref 70–99)
Potassium: 3.5 mmol/L (ref 3.5–5.1)
Sodium: 143 mmol/L (ref 135–145)

## 2018-12-05 LAB — NOVEL CORONAVIRUS, NAA (HOSP ORDER, SEND-OUT TO REF LAB; TAT 18-24 HRS): SARS-CoV-2, NAA: NOT DETECTED

## 2018-12-05 MED ORDER — ENOXAPARIN SODIUM 30 MG/0.3ML ~~LOC~~ SOLN
30.0000 mg | SUBCUTANEOUS | Status: DC
  Administered 2018-12-06: 30 mg via SUBCUTANEOUS
  Filled 2018-12-05: qty 0.3

## 2018-12-05 NOTE — Progress Notes (Signed)
  Date: 12/05/2018  Patient name: [name] [name]  Medical record number: [id number]  Date of birth: December 26, 1934   I have seen and evaluated this patient and I have discussed the plan of care with the house staff. Please see Dr. Lonzo Candy note for complete details. I concur with his findings and plan.     PEG tube order has been cancelled.  Will re-order on Monday if she continues to have severe dysphagia.  She is improved on our evaluation today and may have improved by Monday to avoid this procedure.    Sid Falcon, MD 12/05/2018, 7:49 PM

## 2018-12-05 NOTE — Progress Notes (Signed)
   Subjective: Patient alert this morning and her speech is improving.  She is adamant today about going home instead of SNF.  Team shares recommendations from OT/PT and our opinion is that SNF is the right choice for her safety and recovery.  Plans for PEG tube placement on Monday, but with continued improvement we will cancel order.  We will wait for SLP to re-evaluate.  Patient denies abdominal pain or dysuria.  Objective:  Vital signs in last 24 hours: Vitals:   12/05/18 0500 12/05/18 0535 12/05/18 0927 12/05/18 1201  BP:  (!) 154/47 (!) 142/86 (!) 176/118  Pulse:  64 62 72  Resp:  20 20 16   Temp:  99.1 F (37.3 C) 97.9 F (36.6 C) 98.2 F (36.8 C)  TempSrc:  Oral Oral Oral  SpO2:  95% 99% 97%  Weight: 86.3 kg     Height:       Physical Exam Constitutional:      General: She is not in acute distress. HENT:     Mouth/Throat:     Mouth: Mucous membranes are dry.  Cardiovascular:     Rate and Rhythm: Normal rate. Rhythm irregular.  Abdominal:     General: Bowel sounds are normal.     Palpations: Abdomen is soft.  Neurological:     Mental Status: She is alert.     Comments: Mental Status: Patient is awake, alert, oriented x3 aphasia and L sided neglect Cranial Nerves: II: Pupils equal, round, and reactive to light.  III,IV, VI: EOMI without ptosis or diploplia.  V: Facial sensation is symmetric tolight touch andTemperature. VII: Facial movement is asymmetric.  VIII: hearing is intact to voice X: Uvula elevates symmetrically XI: Shoulder shrug is symmetric. XII: tongue is midline without atrophy or fasciculations.  Motor: 3/5 bilateral UE, 4/5 bilateral lower extremitiy Sensory: Sensation is grossly intact to light touch bilateral UEs & LEs       Assessment/Plan:  Active Problems:   CVA (cerebral vascular accident) ([place])   Acute on chronic renal insufficiency   Hyperglycemia   Weak  84 year old female with a significant PMH of a fib, diabetes, CKD stage  III, diastolic CHF, CAD, and left MCA stroke who presented for weakness after having a fall and was found to have a  R MCA infarct.   Active Problems: CVA  Right MCA infarct in setting of A. fib not taking Eliquis due to cost barrier.-A1cElevated to 9.2 -Lipid panelWNL.  Patient speaking clear today and able to open mouth, move tongue.  We will hold off on PEG tube placement for speech eval on Monday.  Held BP meds for permissive hypertension, but will continue to try and lower over the next few days.  Restarted amlodipine on 7/17.   - AppreciateNeurology'srecommendations - SLP: NPO , c/wtube feeds.  - PT/OT/SLP Evals - IR PEG tube -Aspirin 325 -Neuro checks -Amlodipine 10mg   AKI: Likely2/2dehydration given1 week of diarrhea. Cr.1.69 on admit (baseline 0.8,2017)Cr 1.47on 7/18 Afib: NPO, holding home meds. No treatment dose anticoagulation due petechiae on MRI.  Diabetes: Lantus 10 units qhs, SSI-M q4h History of Falls:PT/OT, Fall Precautions Hypothyroidism:levothyroxine IV 198mcg  Diet - NPO, Tube Feeds  DVT ppx- enoxaparin 30mg  subQ daily CODE STATUS - FULL CODE   Dispo: Anticipated discharge in approximately 1-2 days  [name], MD PGY1  [phone]

## 2018-12-06 DIAGNOSIS — R197 Diarrhea, unspecified: Secondary | ICD-10-CM

## 2018-12-06 LAB — BASIC METABOLIC PANEL
Anion gap: 8 (ref 5–15)
BUN: 33 mg/dL — ABNORMAL HIGH (ref 8–23)
CO2: 20 mmol/L — ABNORMAL LOW (ref 22–32)
Calcium: 8.5 mg/dL — ABNORMAL LOW (ref 8.9–10.3)
Chloride: 114 mmol/L — ABNORMAL HIGH (ref 98–111)
Creatinine, Ser: 1.34 mg/dL — ABNORMAL HIGH (ref 0.44–1.00)
GFR calc Af Amer: 43 mL/min — ABNORMAL LOW (ref >60)
GFR calc non Af Amer: 37 mL/min — ABNORMAL LOW (ref >60)
Glucose, Bld: 320 mg/dL — ABNORMAL HIGH (ref 70–99)
Potassium: 4.3 mmol/L (ref 3.5–5.1)
Sodium: 142 mmol/L (ref 135–145)

## 2018-12-06 LAB — GLUCOSE, CAPILLARY
Glucose-Capillary: 108 mg/dL — ABNORMAL HIGH (ref 70–99)
Glucose-Capillary: 222 mg/dL — ABNORMAL HIGH (ref 70–99)
Glucose-Capillary: 308 mg/dL — ABNORMAL HIGH (ref 70–99)
Glucose-Capillary: 173 mg/dL — ABNORMAL HIGH (ref 70–99)
Glucose-Capillary: 106 mg/dL — ABNORMAL HIGH (ref 70–99)
Glucose-Capillary: 110 mg/dL — ABNORMAL HIGH (ref 70–99)

## 2018-12-06 MED ORDER — APIXABAN 5 MG PO TABS
5.0000 mg | ORAL_TABLET | Freq: Two times a day (BID) | ORAL | Status: DC
  Administered 2018-12-07 – 2018-12-12 (×11): 5 mg via NASOGASTRIC
  Filled 2018-12-06 (×11): qty 1

## 2018-12-06 MED ORDER — JEVITY 1.2 CAL PO LIQD
1000.0000 mL | ORAL | Status: DC
  Administered 2018-12-06: 1000 mL
  Filled 2018-12-06 (×4): qty 1000

## 2018-12-06 MED ORDER — LOPERAMIDE HCL 1 MG/7.5ML PO SUSP
2.0000 mg | ORAL | Status: DC | PRN
  Administered 2018-12-06 – 2018-12-12 (×3): 2 mg via ORAL
  Filled 2018-12-06 (×4): qty 15

## 2018-12-06 MED ORDER — LISINOPRIL 10 MG PO TABS
10.0000 mg | ORAL_TABLET | Freq: Every day | ORAL | Status: DC
  Administered 2018-12-06 – 2018-12-07 (×2): 10 mg via NASOGASTRIC
  Filled 2018-12-06 (×2): qty 1

## 2018-12-06 MED ORDER — INSULIN GLARGINE 100 UNIT/ML ~~LOC~~ SOLN
15.0000 [IU] | Freq: Every day | SUBCUTANEOUS | Status: DC
  Administered 2018-12-07 – 2018-12-08 (×2): 15 [IU] via SUBCUTANEOUS
  Filled 2018-12-06 (×2): qty 0.15

## 2018-12-06 NOTE — TOC Progression Note (Signed)
Transition of Care [facility]) - Progression Note    Patient Details  Name: [name] [name] MRN: [id number] Date of Birth: 1934-06-10  Transition of Care [facility]) CM/SW [place], [place] Phone Number: 12/06/2018, 4:30 PM  Clinical Narrative:     CSW met with the patient at bedside. She was sitting up in the bed and was more alert and oriented. The patient's speech has improved since Friday. CSW asked the patient if she had thought anymore about rehab, she stated that she is not going to rehab and is continuing to decline SNF. The patient has chosen [facility] as her home health agency.   CSW will continue to follow and assist. The patient will need max home health services since she is refusing SNF. She will also need home health orders inputted.    Expected Discharge Plan: [place] Barriers to Discharge: Continued Medical Work up  Expected Discharge Plan and Services Expected Discharge Plan: [place] In-house Referral: Clinical Social Work Discharge Planning Services: NA Post Acute Care Choice: [place] arrangements for the past 2 months: Single Family Home                                       Social Determinants of Health (SDOH) Interventions    Readmission Risk Interventions No flowsheet data found.

## 2018-12-06 NOTE — Progress Notes (Signed)
  Date: 12/06/2018  Patient name: [name] [name]  Medical record number: [id number]  Date of birth: 05-Jun-1934   I have seen and evaluated this patient and I have discussed the plan of care with the house staff. Please see Dr. Tally Joe note for complete details. I concur with his findings and plan.  Patients speech is much clearer today.  She is able to name objects and remember her hospital stay.  She is hopeful to eat some eggs after her next evaluation with speech therapy.    Sid Falcon, MD 12/06/2018, 9:11 PM

## 2018-12-06 NOTE — Progress Notes (Signed)
CSW called Dorian Pod with [facility]. They are considering the patient. They will want to have updated progress on how she is doing.   CSW will continue to follow.   Domenic Schwab, MSW, [place] Worker [facility]  [phone]

## 2018-12-06 NOTE — Progress Notes (Signed)
Subjective: Patient alert and oriented this AM. Speech continue to improve as well a strength. She reports diarrhea due to tube feeds. We discussed continued evaluation by SLP to determine if she will be able to have oral diet versus progressing to a g-tube.   When discussing longer term plans she continues to express her desire to return to home. She was informed that this not the safest option for her and she will likely not be able to receive the level of care she will need. His will be discussed with her family and her moving forward as we work on disposition. Would like to try to get to her chair today.  Objective:  Vital signs in last 24 hours: Vitals:   12/06/18 0322 12/06/18 0500 12/06/18 1030 12/06/18 1218  BP: (!) 166/76  (!) 166/84 (!) 170/98  Pulse: 64  75 85  Resp: 18  18 18   Temp: 97.9 F (36.6 C)  97.9 F (36.6 C) 98.4 F (36.9 C)  TempSrc: Oral  Oral Oral  SpO2: 98%  97% 99%  Weight:  86 kg    Height:       Physical Exam Constitutional:      General: She is not in acute distress. Cardiovascular:     Rate and Rhythm: Normal rate. Rhythm irregular.     Pulses: Normal pulses.  Pulmonary:     Effort: Pulmonary effort is normal. No respiratory distress.     Breath sounds: Normal breath sounds.  Abdominal:     General: Bowel sounds are normal. There is no distension.     Palpations: Abdomen is soft.  Neurological:     Mental Status: She is alert.     Comments: Mental Status: Patient is awake, alert, oriented x3 Improving aphasia  Contiune L sided neglect vs hemianopsia V: Facial sensation is symmetric tolight touch VII: Facial movement is asymmetric. VIII: hearing is intact to voice XI: Head turn is symmetric. XII: tongue is midline without atrophy or fasciculations.  Motor: 3/5 LUE, 4/5 RUE and bilateral lower extremitiy Sensory: Sensation is grossly intact to light touch bilateral UEs & LEs    Assessment/Plan:  Active Problems:   CVA (cerebral  vascular accident) (HCC)   Acute on chronic renal insufficiency   Hyperglycemia   Weak  84 year old female with a significant PMH of a fib, diabetes, CKD stage III, diastolic CHF, CAD, and left MCA stroke who presented for weakness after having a fall and was found to have a  R MCA infarct.   Active Problems:  CVA: Right MCA infarct in setting of A. fib off Eliquis 2/2 cost barrier. A1c9.2, LipidsWNL.  Speech continues to improve, will continue SLP evals with hope of avoiding G-Tube. - SLP evals - PT/OT rec SNF - Aspirin 325  Afib: Will resume Eliquis for now, while working on long term affordable anticoagulation plan at discharge. Rate controlled. - Eliquis 5mg  BID - Discontinue Lovenox  Diarrhea: In settign of tube feeds. Rate slowed to 50cc/hr already - Loperamide PRN  HTN: Remains elevated today. Resuming home medication for gradual reduction in BP post stroke. - Amlodipine 10mg  Daily - Start Lisinopril 10mg  Daily  AKI:Cr.1.69 > 1.34. Baseline ~0.9. On going diarrhea contributing to slow resolution. - AM BMP  Diabetes: On Metformin and 18U Lantus at home. CBGs in 200s-300s on tube feeds. - increase Lantus to 15U qhs -  SSI-M q4h  History of Falls:PT/OT, Fall Precautions Hypothyroidism:levothyroxine IV 94  Diet - NPO, Tube Feeds  DVT ppx- enoxaparin 30mg  subQ daily CODE STATUS - FULL CODE  Dispo: Anticipated discharge in approximately 1-3 days  Pearson Grippe, DO IM PGY-3

## 2018-12-07 ENCOUNTER — Inpatient Hospital Stay (HOSPITAL_COMMUNITY): Payer: Medicare HMO

## 2018-12-07 LAB — BASIC METABOLIC PANEL WITH GFR
Anion gap: 6 (ref 5–15)
BUN: 39 mg/dL — ABNORMAL HIGH (ref 8–23)
CO2: 23 mmol/L (ref 22–32)
Calcium: 8.3 mg/dL — ABNORMAL LOW (ref 8.9–10.3)
Chloride: 114 mmol/L — ABNORMAL HIGH (ref 98–111)
Creatinine, Ser: 1.58 mg/dL — ABNORMAL HIGH (ref 0.44–1.00)
GFR calc Af Amer: 35 mL/min — ABNORMAL LOW
GFR calc non Af Amer: 30 mL/min — ABNORMAL LOW
Glucose, Bld: 220 mg/dL — ABNORMAL HIGH (ref 70–99)
Potassium: 4.4 mmol/L (ref 3.5–5.1)
Sodium: 143 mmol/L (ref 135–145)

## 2018-12-07 LAB — GLUCOSE, CAPILLARY
Glucose-Capillary: 175 mg/dL — ABNORMAL HIGH (ref 70–99)
Glucose-Capillary: 204 mg/dL — ABNORMAL HIGH (ref 70–99)
Glucose-Capillary: 202 mg/dL — ABNORMAL HIGH (ref 70–99)
Glucose-Capillary: 109 mg/dL — ABNORMAL HIGH (ref 70–99)
Glucose-Capillary: 290 mg/dL — ABNORMAL HIGH (ref 70–99)

## 2018-12-07 MED ORDER — WHITE PETROLATUM EX OINT
TOPICAL_OINTMENT | CUTANEOUS | Status: AC
  Administered 2018-12-07: 0.2
  Filled 2018-12-07: qty 28.35

## 2018-12-07 MED ORDER — RESOURCE THICKENUP CLEAR PO POWD
ORAL | Status: DC | PRN
  Filled 2018-12-07: qty 125

## 2018-12-07 NOTE — TOC Progression Note (Signed)
Transition of Care [facility]) - Progression Note    Patient Details  Name: [name] [name] MRN: [id number] Date of Birth: 1934/12/25  Transition of Care [facility]) CM/SW [place], [place] Phone Number: 12/07/2018, 4:20 PM  Clinical Narrative:     CSW spoke with the patient at bedside. The patient was finally agreeable to SNF. She stated that she would agree to go for one week. CSW explained to her that if she required any additional needs the team at the SNF can assist her with coordinating that. CSW obtained permission to fax her out and give her bed offers.   CSW will continue to follow and assist with disposition.   Expected Discharge Plan: Skilled Nursing Facility Barriers to Discharge: Ship broker, Continued Medical Work up  Expected Discharge Plan and Services Expected Discharge Plan: [place] In-house Referral: Clinical Social Work Discharge Planning Services: NA Post Acute Care Choice: [place] arrangements for the past 2 months: Single Family Home                                       Social Determinants of Health (SDOH) Interventions    Readmission Risk Interventions No flowsheet data found.

## 2018-12-07 NOTE — Progress Notes (Addendum)
Modified Barium Swallow Progress Note  Patient Details  Name: [name] [name] MRN: [id number] Date of Birth: 11/01/1934  Today's Date: 12/07/2018  Modified Barium Swallow completed.  Full report located under Chart Review in the Imaging Section.  Brief recommendations include the following:  Clinical Impression  Patient presents with improved swallow function compared to prior exam.  She did not aspirate and mild laryngeal penetration with thin, nectar largely cleared with cued cough.  Oral coordination continues to be impaired resulting in lingual pumping, premature spillage and decreased bolus cohesion.  With liquids, patient noted to hold at posterior oral cavity using base of tongue and tensor veli palatini intentionally eventually resulting in liquid spilling into pharynx.    Breath-hold posture noted prior to swallow trigger with thin liquids - at times successfully sealing airway before swallow but also decreasing amount of laryngeal penetration.  Patient's pharyngeal swallow triggered with a delay at vallecular region but she was protective of her airway.    With last bolus of pudding, SlP had repeatedly cued pt to swallow, to which she stated "I have" when she had not- likely cognitive based.  She will require very strict aspiration precautions.  She did not successfully "masticate" cereal bar or orally transit barium tablet with pudding - Pudding boluses aided transit of cereal bar but increased amount of pudding not successful with tablet.  Anterior and posterior oral holding noted    Recommend pt initiate diet of dys1 *creamy purees! With thin with close follow up for tolerance.  She does not sense mild laryngeal penetration of liquids and will need to monitor vitals to assure tolerance.  Highly recommend follow up SLP to help mitigate patient's swallow.    Pt admits to coughing on coffee prior to admission, she reports "years" of coughing with particulate solids and thin liquids.     Swallow Evaluation Recommendations       SLP Diet Recommendations: Dysphagia 1 (Puree) solids;Thin liquid   Liquid Administration via: Cup   Medication Administration: Crushed with puree   Supervision: Full supervision/cueing for compensatory strategies   Compensations: Slow rate;Small sips/bites(start intake with liquids, follow solid w/liquids, dry swall)   Postural Changes: Remain semi-upright after after feeds/meals (Comment);Seated upright at 90 degrees   Oral Care Recommendations: Oral care before and after PO   Other Recommendations: Have oral suction available;Clarify dietary restrictions  Luanna Salk, MS [facility] SLP Acute Rehab Services Pager [phone] Office [phone]  Macario Golds 12/07/2018,10:12 AM

## 2018-12-07 NOTE — Consult Note (Signed)
Pharmacy student rounding on the IMTS/B2/Lane service was requested to confirm the pricing of the patient's Eliquis prescription following patient stating that it was too expensive to fill. Upon contacting the pharmacy listed in her chart - [place], [facility], I have discovered that the patient has not filled this prescription at that pharmacy. At this time, it is unclear whether the patient used an alternative pharmacy for this medication or whether she decided to forgo taking her Eliquis.   The best recommendation I can make to the team is to consult Case Management regarding this patient's situation. With Case Management onboard they can determine the following:  1. Patient's Medicare based insurer and contact them to determine which of the Direct-Oral Anticoagulant (DOAC) medication(s) is preferred on the insurer's plan.  2. Determine the monthly cost for the preferred medication.  3. Make a follow-up recommendation to the IMTS/B2/Lane service regarding the most affordable option moving forward to control the patient's atrial fibrillation.   [place]

## 2018-12-07 NOTE — Progress Notes (Signed)
Physical Therapy Treatment Patient Details Name: [name] [name] MRN: [id number] DOB: 1934-05-28 Today's Date: 12/07/2018    History of Present Illness Pt is an 84 y/o female who presents with stroke-like symptoms. MRI revealed large, acute R MCA infarct with petechial hemorrhage, as well as remote L MCA infarct and remote lacunar infarcts in L thalamus and R pons. PMH significant for L shoulder fracture s/p surgery 2014, RA, PAF, OA, hypothyroidism, DM, CKD, CHF, cerebral infarction, CAD, R TKR.    PT Comments    Patient seen for mobility progression. Pt is making progress toward PT goals and tolerated gait training distance of 55 ft with RW. Continue to progress as tolerated with anticipated d/c to SNF for further skilled PT services.     Follow Up Recommendations  SNF;Supervision/Assistance - 24 hour     Equipment Recommendations  None recommended by PT    Recommendations for Other Services       Precautions / Restrictions Precautions Precautions: Fall Restrictions Weight Bearing Restrictions: No    Mobility  Bed Mobility Overal bed mobility: Needs Assistance Bed Mobility: Supine to Sit     Supine to sit: Min assist     General bed mobility comments: assist to elevate trunk into sitting; use of rails and cues for sequencing   Transfers Overall transfer level: Needs assistance Equipment used: Rolling walker (2 wheeled) Transfers: Sit to/from Stand Sit to Stand: Mod assist         General transfer comment: cues for safe hand placement; assist to power up into standing   Ambulation/Gait Ambulation/Gait assistance: Min assist Gait Distance (Feet): 55 Feet Assistive device: Rolling walker (2 wheeled) Gait Pattern/deviations: Step-through pattern;Decreased step length - right;Decreased step length - left;Trunk flexed Gait velocity: decreased   General Gait Details: assist to steady; cues for upright posture adn increased bilat step lengths; decreased foot  clearance   Stairs             Wheelchair Mobility    Modified Rankin (Stroke Patients Only) Modified Rankin (Stroke Patients Only) Pre-Morbid Rankin Score: Slight disability Modified Rankin: Moderately severe disability     Balance Overall balance assessment: Needs assistance Sitting-balance support: Feet supported Sitting balance-Leahy Scale: Fair     Standing balance support: Bilateral upper extremity supported;During functional activity Standing balance-Leahy Scale: Poor                              Cognition Arousal/Alertness: Awake/alert Behavior During Therapy: WFL for tasks assessed/performed Overall Cognitive Status: Impaired/Different from baseline Area of Impairment: Following commands;Safety/judgement;Problem solving                       Following Commands: Follows one step commands with increased time Safety/Judgement: Decreased awareness of deficits Awareness: Intellectual Problem Solving: Difficulty sequencing;Requires verbal cues;Slow processing        Exercises      General Comments        Pertinent Vitals/Pain Pain Assessment: No/denies pain    Home Living                      Prior Function            PT Goals (current goals can now be found in the care plan section) Acute Rehab PT Goals Patient Stated Goal: None stated Progress towards PT goals: Progressing toward goals    Frequency    Min 4X/week  PT Plan Current plan remains appropriate    Co-evaluation              AM-PAC PT "6 Clicks" Mobility   Outcome Measure  Help needed turning from your back to your side while in a flat bed without using bedrails?: A Little Help needed moving from lying on your back to sitting on the side of a flat bed without using bedrails?: A Lot Help needed moving to and from a bed to a chair (including a wheelchair)?: A Lot Help needed standing up from a chair using your arms (e.g., wheelchair  or bedside chair)?: A Lot Help needed to walk in hospital room?: A Little Help needed climbing 3-5 steps with a railing? : Total 6 Click Score: 13    End of Session Equipment Utilized During Treatment: Gait belt Activity Tolerance: Patient tolerated treatment well Patient left: in chair;with call bell/phone within reach;with chair alarm set Nurse Communication: Mobility status PT Visit Diagnosis: Other symptoms and signs involving the nervous system (R29.898)     Time: 1000-1021 PT Time Calculation (min) (ACUTE ONLY): 21 min  Charges:  $Gait Training: 8-22 mins                     Erline Levine, PTA Acute Rehabilitation Services Pager: [phone] Office: [phone]     Carolynne Edouard 12/07/2018, 12:01 PM

## 2018-12-07 NOTE — Consult Note (Addendum)
[facility] Nurse wound consult note Reason for Consult: Consult requested for buttocks/sacrum/  Pt is sitting up in the recliner chair and turned from side to side to allow a limited assessment of her buttocks/sacrum area.  The skin is red, moist, and macerated; appearance is consistent with moisture associated skin damage. No open wounds noted upon this limited assessment.  Pt states she has been having frequent incontinent stools.   Dressing procedure/placement/frequency: Barrier cream to protect and repel moisture.  Please re-consult if further assistance is needed.  Thank-you,  Julien Girt MSN, [place], [place], [place], [place]

## 2018-12-07 NOTE — Progress Notes (Signed)
Occupational Therapy Treatment Patient Details Name: [name] [name] MRN: [id number] DOB: Sep 20, 1934 Today's Date: 12/07/2018    History of present illness Pt is an 84 y/o female who presents with stroke-like symptoms. MRI revealed large, acute R MCA infarct with petechial hemorrhage, as well as remote L MCA infarct and remote lacunar infarcts in L thalamus and R pons. PMH significant for L shoulder fracture s/p surgery 2014, RA, PAF, OA, hypothyroidism, DM, CKD, CHF, cerebral infarction, CAD, R TKR.   OT comments  Pt has made significant improvement with ADL transfers and toileting tasks. Pt required only min A for functional mobility in room and for hygiene following toileting. Pt remains confused and with significant safety awareness deficits. SNF remains appropriate d/c.    Follow Up Recommendations  SNF;Supervision/Assistance - 24 hour    Equipment Recommendations  None recommended by OT       Precautions / Restrictions Precautions Precautions: Fall Precaution Comments: L inattention, monitor O2 sats Restrictions Weight Bearing Restrictions: No       Mobility Bed Mobility               General bed mobility comments: Pt sitting in recliner upon entry   Transfers Overall transfer level: Needs assistance Equipment used: Rolling walker (2 wheeled) Transfers: Sit to/from Stand Sit to Stand: Min assist         General transfer comment: cues for safe hand placement; assist to power up into standing     Balance Overall balance assessment: Needs assistance Sitting-balance support: Feet supported Sitting balance-Leahy Scale: Fair Sitting balance - Comments: Pt maintained sitting balance in recliner with no UE support   Standing balance support: Bilateral upper extremity supported;During functional activity Standing balance-Leahy Scale: Fair Standing balance comment: Min A during functional mobility to bathroom                           ADL either  performed or assessed with clinical judgement   ADL Overall ADL's : Needs assistance/impaired Eating/Feeding: Minimal assistance;Sitting Eating/Feeding Details (indicate cue type and reason): thin water given via teaspoon. pt coughing with 1/3 trials             Upper Body Dressing : Minimal assistance;Sitting Upper Body Dressing Details (indicate cue type and reason): Donning hospital gown     Toilet Transfer: Cueing for safety;Minimal assistance   Toileting- Clothing Manipulation and Hygiene: Minimal assistance;Sit to/from stand       Functional mobility during ADLs: Minimal assistance;Cueing for safety;Rolling walker General ADL Comments: Pt required cueing for safety and RW management               Cognition Arousal/Alertness: Awake/alert Behavior During Therapy: WFL for tasks assessed/performed Overall Cognitive Status: Impaired/Different from baseline Area of Impairment: Following commands;Safety/judgement;Problem solving                 Orientation Level: Disoriented to;Situation     Following Commands: Follows one step commands consistently;Follows multi-step commands inconsistently Safety/Judgement: Decreased awareness of deficits Awareness: Intellectual Problem Solving: Difficulty sequencing;Requires verbal cues;Slow processing General Comments: Pt demonstrating poor safety awareness and confused/naked upon entering room                   Pertinent Vitals/ Pain       Pain Assessment: No/denies pain         Frequency  Min 2X/week        Progress Toward Goals  OT Goals(current goals can  now be found in the care plan section)  Progress towards OT goals: Progressing toward goals  Acute Rehab OT Goals Patient Stated Goal: None stated OT Goal Formulation: With patient Time For Goal Achievement: 12/16/18 Potential to Achieve Goals: Good  Plan Discharge plan remains appropriate       AM-PAC OT "6 Clicks" Daily Activity     Outcome  Measure   Help from another person eating meals?: A Little Help from another person taking care of personal grooming?: A Little Help from another person toileting, which includes using toliet, bedpan, or urinal?: A Little Help from another person bathing (including washing, rinsing, drying)?: A Little Help from another person to put on and taking off regular upper body clothing?: A Little Help from another person to put on and taking off regular lower body clothing?: A Little 6 Click Score: 18    End of Session Equipment Utilized During Treatment: Rolling walker  OT Visit Diagnosis: Unsteadiness on feet (R26.81);Repeated falls (R29.6);Muscle weakness (generalized) (M62.81);History of falling (Z91.81)   Activity Tolerance Patient tolerated treatment well   Patient Left in chair;with call bell/phone within reach;with chair alarm set   Nurse Communication Mobility status        Time: 1610-9604 OT Time Calculation (min): 19 min  Charges: OT General Charges $OT Visit: 1 Visit OT Treatments $Self Care/Home Management : 8-22 mins   Curtis Sites OTR/L  12/07/2018, 3:50 PM

## 2018-12-07 NOTE — Progress Notes (Signed)
  Speech Language Pathology Treatment: Dysphagia  Patient Details Name: [name] [name] MRN: [id number] DOB: 1934-12-06 Today's Date: 12/07/2018 Time: 3785-8850 SLP Time Calculation (min) (ACUTE ONLY): 36 min  Assessment / Plan / Recommendation Clinical Impression  Patient seen after MBS to educate her to swallow precautions, mitigation strategies and review of MBS flouro loops.  Pt does demonstrate decreased awareness to level of dysphagia and lack of sensation to oral retention on left side of oral cavity.  Using teach back with written instructions and pt demonstrating overt aspiration episode, education continued.  She repeated "I"ll be ok" multiple times and although was able to teach back diet, possible ramification of aspiration including pna - her ability to recall pertinent information is diminished.  She did not recall speaking to SLP in xray re: SLP "limp" nor informing SLP of her own injury.  Question baseline memory deficits that may have been undiagnosed.  Addressed cognition goals of basic awareness and functional memory during session and carryover indicated with goal for generalization of swallow precautions.  Informed RN that pt will require full assist with po intake for adherence to aspiration precautions.     Per NT, pt had difficulty swallowing pills crushed with yogurt - Extensively orally holding.  When SLP went to room to assist, RN reports she "finally got them down".  SlP highly recommends pt continue with her palliative consult to establish her goals of care given her new stroke, prior event, continued disabling condition and code status.                   HPI HPI: 84 yo female adm to [facility] *transfer* with left sided weakness. Large area of acute right MCA territory infarct centered at the posterior insula and posterior frontal lobe. There is petechial hemorrhage superimposed. Large remote left MCA territory infarct affecting insula and lateral frontal lobe as well.  Remote lacunar infarct in the left thalamus and right pons.  Pt did not pass Yale swallow screen and thus SLP eval ordered.Pt also with h/o HTN, dyspnea, pna, GERD, UTI, bradycardia, renal fx.  Pt reports she lives alone in a trailer with a bird.Pt has had a Cortrak and MD desires repeat evaluation to determine readiness for po diet vs PEG.  Pt's dysarthria has improved as well as phonatory strength.      SLP Plan  Continue with current plan of care;Goals updated       Recommendations  Diet recommendations: Dysphagia 1 (puree);Thin liquid;Nectar-thick liquid Liquids provided via: Cup;Teaspoon Supervision: Patient able to self feed Compensations: Slow rate;Small sips/bites;Other (Comment);Monitor for anterior loss;Minimize environmental distractions(clearance of oral residuals with oral suctioning) Postural Changes and/or Swallow Maneuvers: Seated upright 90 degrees;Upright 30-60 min after meal                Oral Care Recommendations: Oral care QID Follow up Recommendations: Skilled Nursing facility SLP Visit Diagnosis: Dysphagia, oropharyngeal phase (R13.12) Attention and concentration deficit following: Cerebral infarction Plan: Continue with current plan of care;Goals updated       GO                Macario Golds 12/07/2018, 11:17 AM   Luanna Salk, MS [facility] SLP Acute Rehab Services Pager [phone] Office [phone]

## 2018-12-07 NOTE — Progress Notes (Signed)
  Date: 12/07/2018  Patient name: [name] [name]  Medical record number: [id number]  Date of birth: 02-Apr-1935   I have seen and evaluated this patient and I have discussed the plan of care with the house staff. Please see Dr. Lonzo Candy note for complete details. I concur with his findings and plan.    Sid Falcon, MD 12/07/2018, 5:47 PM

## 2018-12-07 NOTE — Progress Notes (Addendum)
   Subjective: Pt seen at the bedside on rounds this AM. Says she feels good and wants to go home. She is aware that she has to do a swallow study before she can go home. She feels stronger today than the past days. Follows commands and responds appropriately to all questions.   NGT removed.   Objective:  Vital signs in last 24 hours: Vitals:   12/06/18 1947 12/06/18 2346 12/07/18 0406 12/07/18 0500  BP: 132/73 (!) 136/55 (!) 143/48   Pulse:  (!) 59 (!) 52   Resp: 18 18    Temp: 98.6 F (37 C) 97.7 F (36.5 C) 98 F (36.7 C)   TempSrc: Oral Oral Oral   SpO2: 98% 99% 98%   Weight:    86.6 kg  Height:       Physical Exam Constitutional:      General: She is not in acute distress. HENT:     Head:     Comments:  Ecchymosis on L. Forehead greenish yellow Cardiovascular:     Rate and Rhythm: Normal rate. Rhythm irregular.  Pulmonary:     Effort: Pulmonary effort is normal. No respiratory distress.  Abdominal:     General: Bowel sounds are normal.     Palpations: Abdomen is soft.  Neurological:     Mental Status: She is alert.     Sensory: No sensory deficit.     Comments: 4/5 Strength in all extremities.      Assessment/Plan:  Active Problems:   CVA (cerebral vascular accident) ([place])   Acute on chronic renal insufficiency   Hyperglycemia   Weak  84 year old female with a significant PMH of a fib, diabetes, CKD stage III, diastolic CHF, CAD, and left MCA stroke who presented for weakness after having a fall and was found to have a  RMCA infarct.  Active Problems:  CVA: Right MCA infarct in setting of A. fib off Eliquis 2/2 cost barrier. A1c9.2, LipidsWNL.  Speech continues to improve.  NG tube removed. Barium Swallow today, patient needed multiple cues according to note to swallow but able to swallow pudding.  Patient started on dysphagia 1 diet and SLP will continue with her.  - SLP evals - PT/OT rec SNF - Aspirin 325  Afib: Will resume Eliquis for now,  while working on long term affordable anticoagulation plan at discharge. Rate controlled. - Eliquis 5mg  BID  Diarrhea: In settign of tube feeds.  Tube feeds stopped 7/20. - Loperamide PRN  HTN: Remains elevated today. Resum home medication for gradual reduction in BP post stroke. - Amlodipine 10mg  Daily - Lisinopril 10mg  Daily  AKI:Cr.1.69 > 1.34. Baseline ~0.9. On going diarrhea contributing to slow resolution. - AM BMP  Diabetes: On Metformin and 18U Lantus at home. CBGs in 100s-200s with increase Lantus to 15 U on tube feeds.  Will monitor now the tube feeds have been stopped and adjust accordingly - Lantus 15U qhs -  SSI-M q4h  History of Falls:PT/OT, Fall Precautions Hypothyroidism:levothyroxine IV 169mcg  Diet - Dyspagia 1 diet  DVT ppx- On Elequis CODE STATUS - FULL CODE.  Dispo: Anticipated discharge in approximately 2-3 day(s).   [name], MD PGY1  [phone]

## 2018-12-07 NOTE — Progress Notes (Signed)
  Speech Language Pathology Treatment: Dysphagia  Patient Details Name: [name] [name] MRN: [id number] DOB: 27-Nov-1934 Today's Date: 12/07/2018 Time: 3329-5188 SLP Time Calculation (min) (ACUTE ONLY): 16 min  Assessment / Plan / Recommendation Clinical Impression  Pt with improved timing of swallow as well as speech intelligibility today.  She does state she does not want feeding tube and again purports baseline dysphagia with cough during intake of food and drink.  Pt does continue with "soft signs of aspiration" with initial boluses of yogurt and honey thick, c/b weak cough, however she may have secretion retention.  No clinical s/s of aspiration with tsp boluses of water. She does however require mod cues to cease head extension with swallow - ? If aids oral transiting and/or if habit.    Given level of aspiration on prior MBS and aspiration without clearance as well as premorbid deficits, recommend repeat MBS with Cortrak removed *as it may negatively impact swallow - anticipate pt will be able to consume a diet = although it may be modified.  Pt and Dr Daryll Drown agreeable to plan.  RN pulled tube with SLP in room.     HPI HPI: 84 yo female adm to [facility] *transfer* with left sided weakness. Large area of acute right MCA territory infarct centered at the posterior insula and posterior frontal lobe. There is petechial hemorrhage superimposed. Large remote left MCA territory infarct affecting insula and lateral frontal lobe as well. Remote lacunar infarct in the left thalamus and right pons.  Pt did not pass Yale swallow screen and thus SLP eval ordered.Pt also with h/o HTN, dyspnea, pna, GERD, UTI, bradycardia, renal fx.  Pt reports she lives alone in a trailer with a bird.Pt has had a Cortrak and MD desires repeat evaluation to determine readiness for po diet vs PEG.  Pt's dysarthria has improved as well as phonatory strength.      SLP Plan  New goals to be determined pending instrumental study       Recommendations  Postural Changes and/or Swallow Maneuvers: Seated upright 90 degrees;Upright 30-60 min after meal                Oral Care Recommendations: Oral care QID Follow up Recommendations: Skilled Nursing facility SLP Visit Diagnosis: Dysphagia, oropharyngeal phase (R13.12) Attention and concentration deficit following: Cerebral infarction Plan: New goals to be determined pending instrumental study       GO                [name] [name] 12/07/2018, 9:00 AM Luanna Salk, MS [facility] SLP [place] Pager [phone] Office [phone]

## 2018-12-08 LAB — BASIC METABOLIC PANEL
Anion gap: 7 (ref 5–15)
BUN: 36 mg/dL — ABNORMAL HIGH (ref 8–23)
CO2: 23 mmol/L (ref 22–32)
Calcium: 8.5 mg/dL — ABNORMAL LOW (ref 8.9–10.3)
Chloride: 114 mmol/L — ABNORMAL HIGH (ref 98–111)
Creatinine, Ser: 1.35 mg/dL — ABNORMAL HIGH (ref 0.44–1.00)
GFR calc Af Amer: 43 mL/min — ABNORMAL LOW (ref >60)
GFR calc non Af Amer: 37 mL/min — ABNORMAL LOW (ref >60)
Glucose, Bld: 60 mg/dL — ABNORMAL LOW (ref 70–99)
Potassium: 3.9 mmol/L (ref 3.5–5.1)
Sodium: 144 mmol/L (ref 135–145)

## 2018-12-08 LAB — GLUCOSE, CAPILLARY
Glucose-Capillary: 109 mg/dL — ABNORMAL HIGH (ref 70–99)
Glucose-Capillary: 280 mg/dL — ABNORMAL HIGH (ref 70–99)
Glucose-Capillary: 183 mg/dL — ABNORMAL HIGH (ref 70–99)
Glucose-Capillary: 120 mg/dL — ABNORMAL HIGH (ref 70–99)

## 2018-12-08 MED ORDER — AMLODIPINE BESYLATE 10 MG PO TABS
10.0000 mg | ORAL_TABLET | Freq: Every day | ORAL | Status: DC
  Administered 2018-12-08 – 2018-12-16 (×9): 10 mg via ORAL
  Filled 2018-12-08 (×9): qty 1

## 2018-12-08 MED ORDER — INSULIN ASPART 100 UNIT/ML ~~LOC~~ SOLN
0.0000 [IU] | Freq: Three times a day (TID) | SUBCUTANEOUS | Status: DC
  Administered 2018-12-08 – 2018-12-09 (×2): 2 [IU] via SUBCUTANEOUS
  Administered 2018-12-09: 3 [IU] via SUBCUTANEOUS
  Administered 2018-12-10: 2 [IU] via SUBCUTANEOUS
  Administered 2018-12-10: 3 [IU] via SUBCUTANEOUS
  Administered 2018-12-11: 2 [IU] via SUBCUTANEOUS
  Administered 2018-12-11: 3 [IU] via SUBCUTANEOUS
  Administered 2018-12-12: 1 [IU] via SUBCUTANEOUS
  Administered 2018-12-12: 5 [IU] via SUBCUTANEOUS
  Administered 2018-12-12: 2 [IU] via SUBCUTANEOUS
  Administered 2018-12-13: 3 [IU] via SUBCUTANEOUS
  Administered 2018-12-13: 2 [IU] via SUBCUTANEOUS
  Administered 2018-12-14: 5 [IU] via SUBCUTANEOUS
  Administered 2018-12-14 – 2018-12-15 (×2): 2 [IU] via SUBCUTANEOUS
  Administered 2018-12-15: 1 [IU] via SUBCUTANEOUS
  Administered 2018-12-15: 2 [IU] via SUBCUTANEOUS
  Administered 2018-12-16: 7 [IU] via SUBCUTANEOUS
  Administered 2018-12-16: 2 [IU] via SUBCUTANEOUS

## 2018-12-08 MED ORDER — LEVOTHYROXINE SODIUM 75 MCG PO TABS
150.0000 ug | ORAL_TABLET | Freq: Every day | ORAL | Status: DC
  Administered 2018-12-08 – 2018-12-16 (×9): 150 ug via ORAL
  Filled 2018-12-08 (×9): qty 2

## 2018-12-08 MED ORDER — LACTATED RINGERS IV SOLN
INTRAVENOUS | Status: AC
  Administered 2018-12-08: 12:00:00 via INTRAVENOUS

## 2018-12-08 MED ORDER — AMIODARONE HCL 200 MG PO TABS
200.0000 mg | ORAL_TABLET | Freq: Every day | ORAL | Status: DC
  Administered 2018-12-08 – 2018-12-16 (×9): 200 mg via ORAL
  Filled 2018-12-08 (×9): qty 1

## 2018-12-08 MED ORDER — LISINOPRIL 10 MG PO TABS
10.0000 mg | ORAL_TABLET | Freq: Every day | ORAL | Status: DC
  Administered 2018-12-08 – 2018-12-16 (×9): 10 mg via ORAL
  Filled 2018-12-08 (×3): qty 1
  Filled 2018-12-08: qty 4
  Filled 2018-12-08: qty 1
  Filled 2018-12-08: qty 4
  Filled 2018-12-08 (×3): qty 1

## 2018-12-08 MED ORDER — GLUCERNA SHAKE PO LIQD
237.0000 mL | Freq: Three times a day (TID) | ORAL | Status: DC
  Administered 2018-12-08 – 2018-12-16 (×20): 237 mL via ORAL

## 2018-12-08 MED ORDER — INSULIN ASPART 100 UNIT/ML ~~LOC~~ SOLN
0.0000 [IU] | Freq: Three times a day (TID) | SUBCUTANEOUS | Status: DC
  Administered 2018-12-08: 8 [IU] via SUBCUTANEOUS

## 2018-12-08 MED ORDER — INSULIN GLARGINE 100 UNIT/ML ~~LOC~~ SOLN
12.0000 [IU] | Freq: Every day | SUBCUTANEOUS | Status: DC
  Administered 2018-12-09 – 2018-12-16 (×8): 12 [IU] via SUBCUTANEOUS
  Filled 2018-12-08 (×8): qty 0.12

## 2018-12-08 NOTE — Progress Notes (Signed)
Inpatient Diabetes Program Recommendations  AACE/ADA: New Consensus Statement on Inpatient Glycemic Control (2015)  Target Ranges:  Prepandial:   less than 140 mg/dL      Peak postprandial:   less than 180 mg/dL (1-2 hours)      Critically ill patients:  140 - 180 mg/dL   Lab Results  Component Value Date   GLUCAP 109 (H) 12/08/2018   HGBA1C 9.2 (H) 12/02/2018    Review of Glycemic Control Results for [name] [name], [name] [name] (MRN [id number]) as of 12/08/2018 11:35  Ref. Range 12/07/2018 11:38 12/07/2018 16:37 12/07/2018 20:59 12/08/2018 06:12  Glucose-Capillary Latest Ref Range: 70 - 99 mg/dL 202 (H) 109 (H) 290 (H) 109 (H)   Diabetes history: Type 2 DM Outpatient Diabetes medications: Metformin 500 mg BID, Lantus 18 units QD Current orders for Inpatient glycemic control: Lantus 12 units QD, Novolog 0-15 units Q4H  Inpatient Diabetes Program Recommendations:    Noted hypoglycemic episode this am of 60 mg/dL and subsequent order changes to Lantus. Of note, patient received Novolog 8 units for a CBG of 290 mg/dL at 2059, however blood sugar obtained after meal, thus contributing to lows. Would recommend decreasing correction to Novolog 0-9 units Q4H until adequate oral intake.   Thanks, Bronson Curb, MSN, RNC-OB Diabetes Coordinator [phone] (8a-5p)

## 2018-12-08 NOTE — Progress Notes (Signed)
Speech Language Pathology Treatment: Dysphagia  Patient Details Name: [name] [name] MRN: [id number] DOB: 09-23-1934 Today's Date: 12/08/2018 Time: [id number] SLP Time Calculation (min) (ACUTE ONLY): 46 min  Assessment / Plan / Recommendation Clinical Impression  Pt sitting upright in bed with NT in room upon SLP arrival. She reports she consumed nearly all of her breakfast (x for eggs).   [name]'s speech is much more intelligible fortunately and she appears in good spirits today.  SLP assisted pt to brush her teeth this am - and reviewed importance for pulmonary health to which pt taught back.  Using teach back, reviewed "universal sign of choking".    With encouragement, pt allowed SLP to observe her consuming po intake - reviewed importance of oral transiting/clearance for airway protection as RN had reported significant difficulty with pt swallowing medications yesterday.  Again, pt states she had difficulty of similar type at home prior to admission.   X4 of 15 swallows during session, pt's voice was gurgly or she was clearing her throat - indicative of penetration to vocal folds at least.No severe oral pocketing noted with minimal intake, however RN does report mild amounts with meal.  Provided pt with solids - prolonged mastication with clearance - therefore currently recommend ongoing puree.  Pt continues to hold food/drink in mouth and speak prior to swallowing despite verbal cues- thus increasing her aspiration risk significantly.  Therefore recommend she consume meals alone - with intermittent supervision to assure tolerating.  Informed RN of recommendations.    SLP also observed pt consuming tsps of water after mouth care- Cueing pt to "swallow before talking" is helpful but does not result in decreased delay.  On the 7th tsp bolus of water, pt presented with overt coughing due to + aspiration per MBS study findings.  Recommend continue dys1/nectar and allow tsps of thin between meals  after mouth care. SLP left pt with thickened coffee - to which she stated "It tastes good".    Please order SlP follow up via [facility] or SNF - Of note, pt reports her son "may be working today".  She informed SLP yesterday that he does not work, she advised today that he helps with "odd jobs with a guy"- today possibly landscaping.  Highly recommend pt go to SNF short term to maximize her rehab potential with hopes to return to her home with her bird.  Thanks for allowing me to help care for this most pleasant pt.  Will continue to follow.       HPI HPI: 84 yo female adm to [facility] *transfer* with left sided weakness. Large area of acute right MCA territory infarct centered at the posterior insula and posterior frontal lobe. There is petechial hemorrhage superimposed. Large remote left MCA territory infarct affecting insula and lateral frontal lobe as well. Remote lacunar infarct in the left thalamus and right pons.  Pt did not pass [facility] swallow screen and thus SLP eval ordered.Pt also with h/o HTN, dyspnea, pna, GERD, UTI, bradycardia, renal fx.  Pt reports she lives alone in a trailer with a bird.Pt had a Cortrak which was removed yesterday and she started on dys1/nectar diet.  Intake at breakfast today was everything except the eggs.  No severe oral pocketing reported today.      SLP Plan  Continue with current plan of care       Recommendations  Diet recommendations: Dysphagia 1 (puree);Nectar-thick liquid(thin water between meals via tsp approved) Liquids provided via: Cup;Teaspoon Supervision: Patient able to self  feed;Intermittent supervision to cue for compensatory strategies Compensations: Slow rate;Small sips/bites;Other (Comment);Monitor for anterior loss;Minimize environmental distractions(clearance of oral residuals with oral suctioning) Postural Changes and/or Swallow Maneuvers: Seated upright 90 degrees;Upright 30-60 min after meal                Oral Care Recommendations: Oral care  QID Follow up Recommendations: Skilled Nursing facility;24 hour supervision/assistance SLP Visit Diagnosis: Dysphagia, oropharyngeal phase (R13.12) Attention and concentration deficit following: Cerebral infarction Plan: Continue with current plan of care       GO                Macario Golds 12/08/2018, 10:04 AM   Luanna Salk, MS [facility] SLP Acute Rehab Services Pager [phone] Office [phone]

## 2018-12-08 NOTE — Progress Notes (Signed)
  Date: 12/08/2018  Patient name: [name] [name]  Medical record number: [id number]  Date of birth: May 10, 1935   I have seen and evaluated this patient and I have discussed the plan of care with the house staff. Please see Dr. Lonzo Candy note for complete details. I concur with his findings and plan.  SNF planning ongoing.  Patient would benefit from further SLP and PT.     Sid Falcon, MD 12/08/2018, 5:44 PM

## 2018-12-08 NOTE — Care Management (Signed)
Per Coralyn Mark L. W/ [employer]  Medicare Co-pay amount for Eliquis 31m. twice a day after the deductible has been met $47.00 for a 30 day supply. Deductible of $250. has not been met. PA not required Teir 3 medication Pharmacies in-net work are: [facility]

## 2018-12-08 NOTE — Progress Notes (Signed)
   Subjective: Pt seen at the bedside this AM. Sitting up in bed drinking coffee. She says that its not as good as her coffee. Ate breakfast this AM.  Slightly dysarthric but she is speaking much clearer today. Says she feels good today. Only wants to do 1 week of rehab at the facility.  SNF is recommended by SLP/ OT/PT/MD.  She wants to go home to see her [facility] name Precious after rehab.  Objective:  Vital signs in last 24 hours: Vitals:   12/07/18 1638 12/07/18 1940 12/08/18 0006 12/08/18 0423  BP: (!) 147/75 135/76 132/75 (!) 125/57  Pulse: 61 (!) 56 (!) 53 78  Resp: 17 17 17 17   Temp: 98.3 F (36.8 C) 97.6 F (36.4 C) 98 F (36.7 C) 98.1 F (36.7 C)  TempSrc: Oral Oral Oral Oral  SpO2: 98% 100% 99% 95%  Weight:      Height:       Physical Exam Constitutional:      General: She is not in acute distress. HENT:     Mouth/Throat:     Mouth: Mucous membranes are dry.     Pharynx: No oropharyngeal exudate.  Eyes:     General: No scleral icterus.    Extraocular Movements: Extraocular movements intact.  Cardiovascular:     Rate and Rhythm: Normal rate. Rhythm irregular.  Pulmonary:     Effort: Pulmonary effort is normal.     Breath sounds: Normal breath sounds.  Abdominal:     General: Bowel sounds are normal.     Palpations: Abdomen is soft.  Skin:    General: Skin is warm and dry.  Neurological:     Mental Status: She is alert.     Assessment/Plan:  Active Problems:   CVA (cerebral vascular accident) ([place])   Acute on chronic renal insufficiency   Hyperglycemia   Weak  84 year old female with a significant PMH of a fib, diabetes, CKD stage III, diastolic CHF, CAD, and left MCA stroke who presented for weakness after having a fall and was found to have a RMCA infarct.  Active Problems:  FAO:ZHYQM MCA infarct in setting of A. fiboffEliquis 2/2cost barrier. A1c9.2,LipidsWNL.  Speech continues to improve.  Noted from SLP note, "patient holds  food/drink in mouth and speaks prior to swallowing despite verbal cues". Patient on dysphagia 1 diet.  Patient will need continued rehabilitation at SNF before returning home. - Koda.Lemma - PT/OT rec SNF - Aspirin 325  Afib:Will resume Eliquis for now, while working on long term affordable anticoagulation plan at discharge. Rate controlled. - Eliquis 5mg  BID  HTN: Normotensive on medication regimen below.  - Amlodipine 10mg Daily - Lisinopril 10mg  Daily  AKI:Cr.1.69> 1.35. Baseline ~0.9.  Patient was on tube feeds and having diarrhea.  Creatinine has fluctuated, NG tube is out now.  Will start on maintenance fluids overnight. - LR 100 ml/hr for 8 hrs  - AM BMP  Diabetes:On Metformin and 18U Lantus at home.  Off tube feeds.  Blood sugar around 100 this morning. Fingerstick 1 check at 60.  Not taking in as much at this time and will decrease Lantus to 12 U.  - Lantus 12Uqhs -SSI-M qhs  History of Falls:PT/OT, Fall Precautions Hypothyroidism:levothyroxine IV 166mcg  Diet - Dyspagia 1 diet  DVT ppx- On Elequis CODE STATUS - FULL CODE.  Dispo: Discharge pending SNF placement  [name], MD PGY1  [phone]

## 2018-12-08 NOTE — Discharge Summary (Signed)
Name: [name] BibleMarjorie E Magoon MRN: [id number] DOB: 07/26/1934 84 y.o. PCP: Patient, No Pcp Per  Date of Admission: 12/01/2018  6:22 PM Date of Discharge:  Attending Physician: Anne Shutteraines, [name] N, MD  Discharge Diagnosis: 1. CVA  Discharge Medications: Allergies as of 12/16/2018      Reactions   Oxycodone Nausea And Vomiting      Medication List    STOP taking these medications   apixaban 5 MG Tabs tablet Commonly known as: Eliquis   metoprolol succinate 25 MG 24 hr tablet Commonly known as: TOPROL-XL     TAKE these medications   albuterol 108 (90 Base) MCG/ACT inhaler Commonly known as: VENTOLIN HFA Inhale 2 puffs into the lungs every 6 (six) hours as needed for wheezing or shortness of breath.   amiodarone 200 MG tablet Commonly known as: PACERONE Take 1 tablet (200 mg total) by mouth daily.   amLODipine 10 MG tablet Commonly known as: NORVASC Take 1 tablet (10 mg total) by mouth daily.   aspirin EC 81 MG tablet Take 81 mg by mouth daily.   atorvastatin 20 MG tablet Commonly known as: LIPITOR Take 1 tablet (20 mg total) by mouth daily. What changed: when to take this   enoxaparin 80 MG/0.8ML injection Commonly known as: LOVENOX Inject 0.8 mLs (80 mg total) into the skin daily.   feeding supplement (PRO-STAT SUGAR FREE 64) Liqd Take 30 mLs by mouth 3 (three) times daily.   feeding supplement Liqd Take 1 Container by mouth 3 (three) times daily with meals.   ferrous sulfate 325 (65 FE) MG tablet Take 325 mg by mouth daily with breakfast.   furosemide 40 MG tablet Commonly known as: LASIX Take 1 tablet (40 mg total) by mouth daily as needed for fluid or edema. What changed:   when to take this  reasons to take this   insulin glargine 100 UNIT/ML injection Commonly known as: LANTUS Inject 0.12 mLs (12 Units total) into the skin daily. What changed: how much to take   latanoprost 0.005 % ophthalmic solution Commonly known as: XALATAN Place 1 drop  into both eyes at bedtime.   levothyroxine 150 MCG tablet Commonly known as: SYNTHROID Take 1 tablet (150 mcg total) by mouth daily before breakfast.   lisinopril 10 MG tablet Commonly known as: ZESTRIL Take 10 mg by mouth daily.   meclizine 12.5 MG tablet Commonly known as: ANTIVERT Take 12.5 mg by mouth 2 (two) times daily as needed for dizziness.   metFORMIN 500 MG tablet Commonly known as: GLUCOPHAGE Take 500 mg by mouth 2 (two) times daily with a meal.   nitroGLYCERIN 0.4 MG SL tablet Commonly known as: NITROSTAT Place 0.4 mg under the tongue every 5 (five) minutes as needed for chest pain.   nystatin powder Commonly known as: MYCOSTATIN/NYSTOP Apply topically 3 (three) times daily.   omeprazole 20 MG capsule Commonly known as: PriLOSEC Take 1 capsule (20 mg total) by mouth 2 (two) times daily. What changed: when to take this   oxyCODONE 5 MG immediate release tablet Commonly known as: Oxy IR/ROXICODONE Take 5 mg by mouth every 8 (eight) hours as needed for severe pain.   potassium chloride SA 20 MEQ tablet Commonly known as: K-DUR Take 1 tablet (20 mEq total) by mouth 2 (two) times daily as needed (When taking loop diuretic for volume overload). What changed:   how much to take  when to take this  reasons to take this   Resource ThickenUp Clear Powd  Take 120 g by mouth as needed.   senna-docusate 8.6-50 MG tablet Commonly known as: Senokot-S Take 1 tablet by mouth at bedtime as needed for mild constipation.   sertraline 50 MG tablet Commonly known as: ZOLOFT Take 1 tablet (50 mg total) by mouth daily.   warfarin 7.5 MG tablet Commonly known as: COUMADIN Take 1 tablet (7.5 mg total) by mouth one time only at 6 PM.       Disposition and follow-up:   Ms.Theresa E Gjerde was [facility] in Stable condition.  At the hospital follow up visit please address:   CVA  Right MCA infarct in setting of A.fib off Eliquis 2/2  cost barrier. Dysarthria and left sided weakness has improved. Patient continued needing cues for left-sided pocketing. - Continue dysphagia 3 diet.  - Back on Eliquis during admission, cant afford as outpatient.Stopped. Started heparin to warfarin bridge on 7/25 and PTT 60 INR 1.1 on 7/29. Warfarin 5mg  .Given Lovenox therapeutic dose. Please continue Lovenox to warfarin bridge. Titrated up to Warfarin 7.5 mg on 7/29.   Chronic conditions managed as following in hospital: A.Fib  - anticoagulation as above. - in A.fib, takes Amiodarone 200mg , but normal rate here off of Metoprolol. Stopped metoprolol. - monitor rate before starting any rate control DM Came in on Lantus 20. Controlled on Lantus 12 units and sensitive sliding scale. HTN - continue Amlodipine and Lisinopril    2.  Labs / imaging needed at time of follow-up: PTT, INR. Going forward she will need continued warfarin monitoring,   3.  Pending labs/ test needing follow-up:   Follow-up Appointments:  Contact information for follow-up providers    [facility]. Schedule an appointment as soon as possible for a visit in 4 week(s).   Specialty: Neurology Contact information: [address] [place] [phone]           Contact information for after-discharge care    Destination    [facility] .   Service: Skilled Nursing Contact information: [address] [place] [phone]                  Hospital Course by problem list: 1. Stroke R MCA branch infarct embolic secondary to known A. fib on Eliquis, but patient reports not taking due to cost. Patient initially failed swallow study and fed with NG tube. Patient started to make progress and NG tube removed. Left sided weakness improved with OT/PT. Called patients pharmacy and she was not purchasing Eliquis. Cost analysis by case management and patient said she could not  afford. Decision made to transition to Warfarin. Heparin to Warfarin bridge not complete by discharge. Lovenox treatment dose given and [facility].  Afib  Patient continued on Amiodarone. Found to be bradycardic at times and normal rate otherwise. Stopped home Metoprolol.   HTN:130-155 systolic in hospital.  - Amlodipine 10mg Daily - Lisinopril 10mg  Daily  AKI:Cr.1.69> 1.42. Baseline ~0.9in 2017.Creatinine did notrespond furtherto fluids. GFR ~37  Diabetes:On Metformin and 18U Lantus at home. - Lantus 12Uqhs -SSI-M qhs  Hypothyroidism:levothyroxine IV 100mcg  History of Falls:PT/OT, Fall Precautions   Discharge Vitals:   BP (!) 157/60 (BP Location: Right Arm)    Pulse (!) 50    Temp (!) 97.4 F (36.3 C) (Oral)    Resp 16    Ht 5\' 3"  (1.6 m)    Wt 87.1 kg    SpO2 100%  BMI 34.01 kg/m   Pertinent Labs, Studies, and Procedures:   CBC Latest Ref Rng & Units 12/16/2018 12/15/2018 12/14/2018  WBC 4.0 - 10.5 K/uL 6.8 7.0 6.6  Hemoglobin 12.0 - 15.0 g/dL 8.1(X) 9.1(Y) 10.1(L)  Hematocrit 36.0 - 46.0 % 29.8(L) 28.3(L) 32.2(L)  Platelets 150 - 400 K/uL 339 326 358   CMP Latest Ref Rng & Units 12/12/2018 12/09/2018 12/08/2018  Glucose 70 - 99 mg/dL - 782(N) 56(O)  BUN 8 - 23 mg/dL - 13(Y) 86(V)  Creatinine 0.44 - 1.00 mg/dL 7.84(O) 9.62(X) 5.28(U)  Sodium 135 - 145 mmol/L - 143 144  Potassium 3.5 - 5.1 mmol/L - 4.4 3.9  Chloride 98 - 111 mmol/L - 114(H) 114(H)  CO2 22 - 32 mmol/L - 24 23  Calcium 8.9 - 10.3 mg/dL - 1.3(K) 4.4(W)  Total Protein 6.5 - 8.1 g/dL - - -  Total Bilirubin 0.3 - 1.2 mg/dL - - -  Alkaline Phos 38 - 126 U/L - - -  AST 15 - 41 U/L - - -  ALT 0 - 44 U/L - - -   7/14 Hip unilat w or w/o pelvis  FINDINGS: Interval right hip prosthesis. Diffuse osteopenia. No fracture or dislocation seen. Lower lumbar spine degenerative changes. Extensive arterial calcifications.  IMPRESSION: No acute abnormality. 7/14 Chest  xray FINDINGS: No significant change in enlargement of the cardiac silhouette. Interval minimal linear atelectasis or scarring in the left mid lung zone. Otherwise, clear lungs with normal vascularity. No fracture or pneumothorax seen. No pleural fluid. Thoracic spine degenerative changes.  IMPRESSION: No acute abnormality. Stable cardiomegaly. 7/14 CT Head wo Contrast  FINDINGS: Brain: A moderate-sized chronic left MCA infarct is again noted centered in the frontal operculum. Patchy hypodensities are again seen in the cerebral white matter bilaterally, nonspecific but compatible with moderate chronic small vessel ischemic disease. There is the suggestion of new mild asymmetric hypoattenuation of right parietal white matter with questionably diminished gray-white differentiation (for example series 2, images 18 and 19). No acute intracranial hemorrhage, midline shift, or extra-axial fluid collection is identified. There is mild cerebral atrophy. A 1 cm focus of extra-axial calcification posteriorly over the right frontal convexity is unchanged and may represent a meningioma.  Vascular: Calcified atherosclerosis at the skull base. No hyperdense vessel.  Skull: No fracture or focal osseous lesion.  Sinuses/Orbits: Clear paranasal sinuses. Trace mastoid effusions. Bilateral cataract extraction.  Other: None.  IMPRESSION: 1. Possible early acute right parietal infarct. 2. No acute intracranial hemorrhage. 3. Chronic ischemia including an old left MCA infarct.  7/15 MR Brain WO Contrast FINDINGS: Brain: Large area of acute right MCA territory infarct centered at the posterior insula and posterior frontal lobe. There is petechial hemorrhage superimposed. Large remote left MCA territory infarct affecting insula and lateral frontal lobe as well. Remote lacunar infarct in the left thalamus and right pons. Ischemic gliosis in the cerebral white matter. No masslike finding  or hydrocephalus.  Vascular: Grossly preserved flow voids  Skull and upper cervical spine: Negative gross marrow lesion  Sinuses/Orbits: Bilateral cataract resection  Other: Cystic appearing mass in the right parotid measuring 21 mm, stable since 07/16/2018 and likely incidental given comorbidities.  Significantly motion degraded study which could obscure pathology.  IMPRESSION: 1. Large, acute right MCA branch infarct with petechial hemorrhage. 2. Remote left MCA branch infarct and remote lacunar infarcts in the left thalamus and right pons. 3. Significantly motion degraded study.  7/15 Swallow Func Spech path Patient currently presents with moderately severe  oropharyngeal dysphagia with sensorimotor deficits.  Testing was abbreviated due to pt's gross aspiration - therefore only nectar via tsp x2 was tested,  Gross lingual weakness results in grossly impaired = delayed oral transiting.  Pt extends head to aid oral transit which results in premature spillage of boluses into pharynx.  Gross aspiration occured before the swallow as bolus spilled into open airway due to oral discoordination and delay in pharyngeal swallow.  Aspiration followed by delayed weak cough which caused pt mild distress.  Trace residuals in pharynx mixed with retained secretions.  Pt reports some difficulty "catching her breath" after aspiration episode and benefited from verbal encouragement.      Testing was aborted at that time due to pt obvious discomfort. At this time only recommend pt have oral moisture via toothette after oral care.  Prognosis for swallow function to improve to allow a po diet is good however at this time she is at very high risk for aspiration and malnutrition with current respiratory status.    Recommend consider short term feeding tube to allow for spontaneous healing from current stroke.  Due to silent nature of laryngeal penetration and weak cough, would recommend repeat MBS prior  to po initiation.  Pt informed and reports understanding but continued to ask for water.  Recommend oral suction be set up for adequate oral care.  Notified MD of results of testing and recommendations.   SLP Visit Diagnosis Dysphagia, oropharyngeal phase     7/15 US Renal  FINDINGS: Right Kidney:  Renal measurements: 11.1 x 4.3 x 6.3 cm = volume: 156.7 mL . Echogenicity within normal limits. No mass or hydronephrosis visualized.  Left Kidney:  Renal measurements: 10.0 x 4.7 x 5.5 cm = volume: 136.4 mL. Echogenicity within normal limits. No mass or hydronephrosis visualized.  Bladder:  Appears normal for degree of bladder distention.  Other:  Small left pleural effusion.  IMPRESSION: Negative for hydronephrosis.  The kidneys appear normal.  Small left pleural effusion. 7/15 MR Angio Head Wo Contrast  FINDINGS: The study is moderately to severely motion degraded.  The visualized distal vertebral arteries are patent to the basilar with the left being mildly dominant. Cerebellar branch vessel evaluation is limited by motion. The basilar artery is patent without evidence of flow limiting stenosis. Both PCAs are patent with chronic moderate right and severe left P2 stenoses, similar to the prior study.  The internal carotid arteries are patent from skull base to carotid termini without evidence of flow limiting stenosis. The ACAs and MCAs are patent with similar appearance of mild distal right M1 stenosis. Assessment for MCA branch vessel stenosis is limited by motion artifact which also limits assessment of the ACAs. No large aneurysm is identified.  IMPRESSION: 1. Motion degraded examination.  No large vessel occlusion. 2. No evidence of flow limiting proximal anterior circulation stenosis. 3. Moderate right and severe left P2 stenoses.  7/15 Abd xray  FINDINGS: Feeding tube was placed under fluoroscopic guidance. The tip is in the distal  duodenum at the ligament of Treitz.  IMPRESSION: Feeding tube tip at the ligament of Treitz. 7/17 CT Abd  WO contrast FINDINGS: Lower chest: Small bilateral pleural effusions. Consolidation/atelectasis posteriorly in both lung bases. Coronary calcifications.  Hepatobiliary: Stable 1.1 cm calcification in hepatic segment 8. No new lesion evident on this noncontrast study. No biliary ductal dilatation. Multiple small partially calcified stones layer in the dependent aspect of the nondistended gallbladder.  Pancreas: Mild diffuse atrophy. No mass or ductal dilatation.  Spleen: Vascular calcifications. Normal in size without focal lesion.  Adrenals/Urinary Tract: Normal adrenals. Kidneys unremarkable. No hydronephrosis.  Stomach/Bowel: Feeding tube extends to the proximal jejunum. The stomach is decompressed. There is a small window evident for percutaneous gastrostomy placement. Visualized portions of small bowel and colon are decompressed. Scattered colonic diverticula without adjacent inflammatory/edematous change.  Vascular/Lymphatic: Aortic Atherosclerosis (ICD10-170.0) without aneurysm. No abdominal or mesenteric adenopathy.  Other: No ascites. No free air.  Musculoskeletal: Small ventral hernia containing only mesenteric fat. Mild lumbar levoscoliosis with multilevel spondylitic change. Stable grade 1 anterolisthesis L5-S1 probably secondary to facet DJD. No fracture or worrisome bone lesion.  IMPRESSION: 1. There is a small percutaneous window for gastrostomy placement. Consider barium administration through the feeding tube 2-3 days preop to allow intraprocedural colonic opacification and identification. 2. Small bilateral pleural effusions. 3. Small ventral hernia containing only mesenteric fat. 4. Colonic diverticulosis. 5. Lumbar levoscoliosis with multilevel spondylitic change. 6. Coronary and Aortic Atherosclerosis (ICD10-170.0). Please note that  although the presence of coronary artery calcium documents the presence of coronary artery disease, the severity of this disease and any potential stenosis cannot be assessed on this non-gated CT examination. Assessment for potential risk factor modification, dietary therapy or pharmacologic therapy may be warranted, if clinically indicated. 7/20 Swallow Func Speech  Patient presents with improved swallow function compared to prior exam.  She did not aspirate and mild laryngeal penetration with thin, nectar largely cleared with cued cough.  Oral coordination continues to be impaired resulting in lingual pumping, premature spillage and decreased bolus cohesion.  With liquids, patient noted to hold at posterior oral cavity using base of tongue and tensor veli palatini intentionally eventually resulting in liquid spilling into pharynx.    Breath-hold posture noted prior to swallow trigger with thin liquids - at times successfully sealing airway before swallow but also decreasing amount of laryngeal penetration.  Patient's pharyngeal swallow triggered with a delay at vallecular region but she was protective of her airway.    With last bolus of pudding, SlP had repeatedly cued pt to swallow, to which she stated "I have" when she had not- likely cognitive based.  She will require very strict aspiration precautions.  She did not successfully "masticate" cereal bar or orally transit barium tablet with pudding - Pudding boluses aided transit of cereal bar but increased amount of pudding not successful with tablet.  Anterior oral holding observed frequently.    Recommend pt initiate diet of dys1 *creamy purees! With thin with close follow up for tolerance.  She does not sense mild laryngeal penetration of liquids and will need to monitor vitals to assure tolerance.  Highly recommend follow up SLP to help mitigate patient's swallow.     SLP Visit Diagnosis Dysphagia, oropharyngeal phase (R13.12)       Discharge Instructions: Discharge Instructions    (HEART FAILURE PATIENTS) Call MD:  Anytime you have any of the following symptoms: 1) 3 pound weight gain in 24 hours or 5 pounds in 1 week 2) shortness of breath, with or without a dry hacking cough 3) swelling in the hands, feet or stomach 4) if you have to sleep on extra pillows at night in order to breathe.   Complete by: As directed    Ambulatory referral to Neurology   Complete by: As directed    Follow up with stroke clinic NP (Jessica Vanschaick or Cecille Rubin, if both not available, consider Zachery Dauer, or Ahern) at [facility] in about 4 weeks. Thanks.  Call MD for:  difficulty breathing, headache or visual disturbances   Complete by: As directed    Call MD for:  persistant dizziness or light-headedness   Complete by: As directed    Diet - low sodium heart healthy   Complete by: As directed    Discharge instructions   Complete by: As directed    Thank you for allowing [place] to care for you  Your symptoms were due to a stroke, which was likely due to a clot that formed because you were unable to take your blood thinning medication for your atrial fibrillation - You have been started on a new blood thinner, Warfarin - You will take Lovenox injections until this blood thinner is at the appropriate levels - You will need to have regular monitoring at your doctor's office after leaving the rehab facility  Your Medications have changed on discharge - STOP Metoprolol - Lantus dose reduced to 12 Units Daily - Lasix changed to as needed (for weight gain or edema) - Please follow up with your PCP about possible further changes  Please follow up with your PCP in the next week after discharge. You will need to see them weekly at first for adjustments of your warfarin dose.  Please follow up with neurology as scheduled by them   Increase activity slowly   Complete by: As directed       Signed: Thurmon Fair, MD PGY1   [phone]

## 2018-12-08 NOTE — Care Management Important Message (Signed)
Important Message  Patient Details  Name: [name] [name] MRN: [id number] Date of Birth: 10-16-1934   Medicare Important Message Given:  Yes     Orbie Pyo 12/08/2018, 3:27 PM

## 2018-12-08 NOTE — Progress Notes (Signed)
Nutrition Follow-up  DOCUMENTATION CODES:   Obesity unspecified  INTERVENTION:  Provide Glucerna Shake po TID (thickened to nectar thick consistency), each supplement provides 220 kcal and 10 grams of protein.  Provide Magic cup TID with meals, each supplement provides 290 kcal and 9 grams of protein.  Encourage adequate PO intake.   NUTRITION DIAGNOSIS:   Inadequate oral intake related to inability to eat as evidenced by NPO status; diet advanced; improving  GOAL:   Patient will meet greater than or equal to 90% of their needs; progressing  MONITOR:   TF tolerance, Skin, Weight trends, Labs, I & O's  REASON FOR ASSESSMENT:   Consult Enteral/tube feeding initiation and management  ASSESSMENT:   84 year old female with a significant PMH of a fib, diabetes, CKD stage III, diastolic CHF, CAD, and left MCA stroke who presented for weakness after having a fall and was found to have a probable R parietal infarct.  Pt underwent MBS yesterday. Pt with improved swallow function. Diet has been advanced to a dysphagia 1 diet with nectar thick liquids. Cortrak NGT removed yesterday. Tube feeding has been discontinued. RD to order nutritional supplements to aid in caloric and protein needs.   Labs and medications reviewed.   Diet Order:   Diet Order            DIET - DYS 1 Room service appropriate? Yes; Fluid consistency: Nectar Thick  Diet effective now              EDUCATION NEEDS:   Not appropriate for education at this time  Skin:  Skin Assessment: Reviewed RN Assessment  Last BM:  7/20  Height:   Ht Readings from Last 1 Encounters:  12/02/18 5\' 3"  (1.6 m)    Weight:   Wt Readings from Last 1 Encounters:  12/07/18 86.6 kg    Ideal Body Weight:  52.27 kg  BMI:  Body mass index is 33.82 kg/m.  Estimated Nutritional Needs:   Kcal:  1700-1900  Protein:  85-100 grams  Fluid:  >/= 1.7 L/day    Corrin Parker, MS, RD, LDN Pager # [phone] After  hours/ weekend pager # [phone]

## 2018-12-08 NOTE — Progress Notes (Signed)
Physical Therapy Treatment Patient Details Name: [name] [name] MRN: [id number] DOB: June 13, 1934 Today's Date: 12/08/2018    History of Present Illness Pt is an 84 y/o female who presents with stroke-like symptoms. MRI revealed large, acute R MCA infarct with petechial hemorrhage, as well as remote L MCA infarct and remote lacunar infarcts in L thalamus and R pons. PMH significant for L shoulder fracture s/p surgery 2014, RA, PAF, OA, hypothyroidism, DM, CKD, CHF, cerebral infarction, CAD, R TKR.    PT Comments    Patient seen for mobility progression. Pt is making progress toward PT goals. Pt continues to present with cognitive deficits and impaired mobility. Continue to progress as tolerated with anticipated d/c to SNF for further skilled PT services.    Follow Up Recommendations  SNF;Supervision/Assistance - 24 hour     Equipment Recommendations  None recommended by PT    Recommendations for Other Services       Precautions / Restrictions Precautions Precautions: Fall Restrictions Weight Bearing Restrictions: No    Mobility  Bed Mobility               General bed mobility comments: Pt sitting in recliner upon entry   Transfers Overall transfer level: Needs assistance Equipment used: Rolling walker (2 wheeled) Transfers: Sit to/from Stand Sit to Stand: Min assist;Min guard         General transfer comment: cues for safe hand placement; assist to steady  Ambulation/Gait Ambulation/Gait assistance: Min assist;Min guard Gait Distance (Feet): 75 Feet Assistive device: Rolling walker (2 wheeled) Gait Pattern/deviations: Step-through pattern;Decreased step length - right;Decreased step length - left;Trunk flexed;Decreased dorsiflexion - left Gait velocity: decreased   General Gait Details: SOB noted which pt reports is baseline with mobility; assist to steady; cues for upright posture adn increased bilat step lengths; decreased foot clearance   Stairs              Wheelchair Mobility    Modified Rankin (Stroke Patients Only) Modified Rankin (Stroke Patients Only) Pre-Morbid Rankin Score: Slight disability Modified Rankin: Moderately severe disability     Balance Overall balance assessment: Needs assistance Sitting-balance support: Feet supported Sitting balance-Leahy Scale: Fair     Standing balance support: Bilateral upper extremity supported;During functional activity Standing balance-Leahy Scale: Poor                              Cognition Arousal/Alertness: Awake/alert Behavior During Therapy: WFL for tasks assessed/performed Overall Cognitive Status: No family/caregiver present to determine baseline cognitive functioning Area of Impairment: Following commands;Safety/judgement;Problem solving;Memory;Attention                   Current Attention Level: Sustained Memory: Decreased short-term memory Following Commands: Follows one step commands consistently;Follows multi-step commands inconsistently Safety/Judgement: Decreased awareness of deficits Awareness: Intellectual Problem Solving: Difficulty sequencing;Requires verbal cues;Slow processing        Exercises Other Exercises Other Exercises: sit to stands X 5 with cues/assist for return to sitting due to poor eccentric loading    General Comments        Pertinent Vitals/Pain Pain Assessment: No/denies pain    Home Living                      Prior Function            PT Goals (current goals can now be found in the care plan section) Acute Rehab PT Goals Patient Stated Goal:  None stated Progress towards PT goals: Progressing toward goals    Frequency    Min 4X/week      PT Plan Current plan remains appropriate    Co-evaluation              AM-PAC PT "6 Clicks" Mobility   Outcome Measure  Help needed turning from your back to your side while in a flat bed without using bedrails?: A Little Help needed  moving from lying on your back to sitting on the side of a flat bed without using bedrails?: A Little Help needed moving to and from a bed to a chair (including a wheelchair)?: A Little Help needed standing up from a chair using your arms (e.g., wheelchair or bedside chair)?: A Little Help needed to walk in hospital room?: A Little Help needed climbing 3-5 steps with a railing? : Total 6 Click Score: 16    End of Session Equipment Utilized During Treatment: Gait belt Activity Tolerance: Patient tolerated treatment well Patient left: in chair;with call bell/phone within reach;with chair alarm set Nurse Communication: Mobility status PT Visit Diagnosis: Other symptoms and signs involving the nervous system (R29.898)     Time: 6948-5462 PT Time Calculation (min) (ACUTE ONLY): 24 min  Charges:  $Gait Training: 23-37 mins                     Erline Levine, PTA Acute Rehabilitation Services Pager: [phone] Office: [phone]     Carolynne Edouard 12/08/2018, 2:52 PM

## 2018-12-09 LAB — GLUCOSE, CAPILLARY
Glucose-Capillary: 112 mg/dL — ABNORMAL HIGH (ref 70–99)
Glucose-Capillary: 231 mg/dL — ABNORMAL HIGH (ref 70–99)
Glucose-Capillary: 183 mg/dL — ABNORMAL HIGH (ref 70–99)
Glucose-Capillary: 153 mg/dL — ABNORMAL HIGH (ref 70–99)

## 2018-12-09 LAB — BASIC METABOLIC PANEL
Anion gap: 5 (ref 5–15)
BUN: 36 mg/dL — ABNORMAL HIGH (ref 8–23)
CO2: 24 mmol/L (ref 22–32)
Calcium: 8.4 mg/dL — ABNORMAL LOW (ref 8.9–10.3)
Chloride: 114 mmol/L — ABNORMAL HIGH (ref 98–111)
Creatinine, Ser: 1.42 mg/dL — ABNORMAL HIGH (ref 0.44–1.00)
GFR calc Af Amer: 40 mL/min — ABNORMAL LOW (ref >60)
GFR calc non Af Amer: 35 mL/min — ABNORMAL LOW (ref >60)
Glucose, Bld: 109 mg/dL — ABNORMAL HIGH (ref 70–99)
Potassium: 4.4 mmol/L (ref 3.5–5.1)
Sodium: 143 mmol/L (ref 135–145)

## 2018-12-09 NOTE — Progress Notes (Signed)
  Speech Language Pathology Treatment: Dysphagia  Patient Details Name: [name] [name] MRN: [id number] DOB: February 27, 1935 Today's Date: 12/09/2018 Time: 0810-0827 SLP Time Calculation (min) (ACUTE ONLY): 17 min  Assessment / Plan / Recommendation Clinical Impression  Pt today sitting upright eating breakfast - she is unhappy with her diet and thus SLP observed with cornflakes/solids and she demonstrating significant left oral pocketing without awareness.  She demonstrates decreased sustained attention to task as she is distracted by trying to have conversation and looking around.  Pt used mirror on her tray table to check for buccal region residuals on the left - needing moderate verbal and visual cues to clear with significant delay.  Pt continues with anterior labial spillage - minimal of liquids during intake without awareness.    Following breakfast, pt consume thin water via cup - delayed swallow continued but no indication of aspiration with cup boluses of water  - with excellent tolerance and no indication of aspiration. Recommend continue dys1/nectar diet but advance to allow thin water via cup between meals after oral care.    Highly recommend SLP follow up after dc from hospital to manage dysphagia with hopes of advancement during her coarse in rehab.prior to going home.  Of note, pt continues to reports premorbid dysphagias with oral pocketing and coughing with intake thus suspect possible exacerbation due to current CVA with decrease mobility increasing aspiration pna risk.  Pt agreeable to plan resistantly.    Asked pt to have son bring her dentures and adhesive to her rehab - as she reports she will often use them (but not always) with intake and anticipate will aid in appropriate dietary advancement.      HPI HPI: 84 yo female adm to [facility] *transfer* with left sided weakness. Large area of acute right MCA territory infarct centered at the posterior insula and posterior frontal lobe.  There is petechial hemorrhage superimposed. Large remote left MCA territory infarct affecting insula and lateral frontal lobe as well. Remote lacunar infarct in the left thalamus and right pons.  Pt did not pass [facility] swallow screen and thus SLP eval ordered.Pt also with h/o HTN, dyspnea, pna, GERD, UTI, bradycardia, renal fx.  Pt reports she lives alone in a trailer with a bird.Pt had a Cortrak which was removed yesterday and she started on dys1/nectar diet.      SLP Plan  Continue with current plan of care       Recommendations  Diet recommendations: Dysphagia 1 (puree);Nectar-thick liquid Liquids provided via: Cup;Teaspoon Supervision: Patient able to self feed;Intermittent supervision to cue for compensatory strategies Compensations: Lingual sweep for clearance of pocketing;Slow rate;Small sips/bites Postural Changes and/or Swallow Maneuvers: Seated upright 90 degrees;Upright 30-60 min after meal                Oral Care Recommendations: Oral care QID Follow up Recommendations: Skilled Nursing facility;24 hour supervision/assistance SLP Visit Diagnosis: Dysphagia, oropharyngeal phase (R13.12) Attention and concentration deficit following: Cerebral infarction Plan: Continue with current plan of care       GO                Macario Golds 12/09/2018, 8:36 AM Luanna Salk, MS [facility] SLP Acute Rehab Services Pager [phone] Office [phone]

## 2018-12-09 NOTE — Progress Notes (Signed)
Occupational Therapy Treatment Patient Details Name: [name] [name] MRN: [id number] DOB: [birth year] Today's Date: 12/09/2018    History of present illness Pt is an 84 y/o female who presents with stroke-like symptoms. MRI revealed large, acute R MCA infarct with petechial hemorrhage, as well as remote L MCA infarct and remote lacunar infarcts in L thalamus and R pons. PMH significant for L shoulder fracture s/p surgery 2014, RA, PAF, OA, hypothyroidism, DM, CKD, CHF, cerebral infarction, CAD, R TKR.   OT comments  Pt performing mobility in room with minguardA and supervisionA for grooming at sink and minguardA for toilet hygiene. Pt requiring cues to attend to multi-step commands. O2 sats >90% on RA. Pt unable to comb through hair or put her hair up due to weakness in BUEs at shoulders after several attempts. Pt would benefit from continued OT skilled services for ADL, mobility and safety in SNF setting. OT following acutely.     Follow Up Recommendations  SNF;Supervision/Assistance - 24 hour    Equipment Recommendations  None recommended by OT    Recommendations for Other Services      Precautions / Restrictions Precautions Precautions: Fall Precaution Comments: L inattention, monitor O2 sats Restrictions Weight Bearing Restrictions: No       Mobility Bed Mobility               General bed mobility comments: Pt sitting in recliner upon entry   Transfers Overall transfer level: Needs assistance Equipment used: Rolling walker (2 wheeled) Transfers: Sit to/from Stand Sit to Stand: Min guard         General transfer comment: minguardA for safety    Balance Overall balance assessment: Needs assistance Sitting-balance support: Feet supported Sitting balance-Leahy Scale: Fair Sitting balance - Comments: Pt maintained sitting balance in recliner with no UE support   Standing balance support: Bilateral upper extremity supported;During functional activity Standing  balance-Leahy Scale: Poor Standing balance comment: able to stand with RW and min guard                           ADL either performed or assessed with clinical judgement   ADL Overall ADL's : Needs assistance/impaired     Grooming: Min guard;Standing                               Functional mobility during ADLs: Min guard;Cueing for safety;Cueing for sequencing;Rolling walker General ADL Comments: Pt required cueing for safety and RW management     Vision       Perception     Praxis      Cognition Arousal/Alertness: Awake/alert Behavior During Therapy: WFL for tasks assessed/performed Overall Cognitive Status: No family/caregiver present to determine baseline cognitive functioning Area of Impairment: Safety/judgement;Awareness                 Orientation Level: Disoriented to;Situation Current Attention Level: Sustained Memory: Decreased short-term memory Following Commands: Follows multi-step commands inconsistently Safety/Judgement: Decreased awareness of deficits Awareness: Intellectual Problem Solving: Difficulty sequencing General Comments: Pt following 1 step commands        Exercises     Shoulder Instructions       General Comments      Pertinent Vitals/ Pain       Pain Assessment: No/denies pain  Home Living  Prior Functioning/Environment              Frequency  Min 2X/week        Progress Toward Goals  OT Goals(current goals can now be found in the care plan section)  Progress towards OT goals: Progressing toward goals  Acute Rehab OT Goals Patient Stated Goal: None stated OT Goal Formulation: With patient Time For Goal Achievement: 12/16/18 Potential to Achieve Goals: Good ADL Goals Pt Will Perform Grooming: with supervision Pt Will Perform Upper Body Bathing: with supervision Pt Will Perform Lower Body Bathing: sit to/from stand;with min  assist Pt Will Perform Upper Body Dressing: with supervision Pt Will Perform Lower Body Dressing: sit to/from stand;with min assist Pt Will Transfer to Toilet: with min assist Pt Will Perform Toileting - Clothing Manipulation and hygiene: with min assist  Plan Discharge plan remains appropriate    Co-evaluation                 AM-PAC OT "6 Clicks" Daily Activity     Outcome Measure   Help from another person eating meals?: A Little Help from another person taking care of personal grooming?: A Little Help from another person toileting, which includes using toliet, bedpan, or urinal?: A Little Help from another person bathing (including washing, rinsing, drying)?: A Little Help from another person to put on and taking off regular upper body clothing?: A Little Help from another person to put on and taking off regular lower body clothing?: A Little 6 Click Score: 18    End of Session Equipment Utilized During Treatment: Rolling walker;Gait belt  OT Visit Diagnosis: Unsteadiness on feet (R26.81);Repeated falls (R29.6);Muscle weakness (generalized) (M62.81);History of falling (Z91.81)   Activity Tolerance Patient tolerated treatment well   Patient Left in chair;with call bell/phone within reach;with chair alarm set   Nurse Communication Mobility status        Time: 1430-1455 OT Time Calculation (min): 25 min  Charges: OT General Charges $OT Visit: 1 Visit OT Treatments $Self Care/Home Management : 8-22 mins $Neuromuscular Re-education: 8-22 mins  Cristi Loron) Glendell Docker OTR/L Acute Rehabilitation Services Pager: [phone] Office: [phone]    Lonzo Cloud 12/09/2018, 5:27 PM

## 2018-12-09 NOTE — Progress Notes (Signed)
Physical Therapy Treatment Patient Details Name: [name] [name] MRN: [id number] DOB: [birth year] Today's Date: 12/09/2018    History of Present Illness Pt is an 84 y/o female who presents with stroke-like symptoms. MRI revealed large, acute R MCA infarct with petechial hemorrhage, as well as remote L MCA infarct and remote lacunar infarcts in L thalamus and R pons. PMH significant for L shoulder fracture s/p surgery 2014, RA, PAF, OA, hypothyroidism, DM, CKD, CHF, cerebral infarction, CAD, R TKR.    PT Comments    Patient was able to progress ambulation distance today, however is unable to handle cognitive challenge while ambulating. She performed sit<>stand for neuromuscular re-education. Pt is making progress towards goals and current plan remains appropriate.     Follow Up Recommendations  SNF;Supervision/Assistance - 24 hour     Equipment Recommendations  None recommended by PT    Recommendations for Other Services Rehab consult     Precautions / Restrictions Precautions Precautions: Fall Precaution Comments: L inattention, monitor O2 sats Restrictions Weight Bearing Restrictions: No    Mobility  Bed Mobility               General bed mobility comments: Pt sitting in recliner upon entry   Transfers Overall transfer level: Needs assistance Equipment used: Rolling walker (2 wheeled) Transfers: Sit to/from Stand Sit to Stand: Min guard         General transfer comment: min guard for safety. Sit<>stand performed 10x for neuromuscular re-ed.  Ambulation/Gait Ambulation/Gait assistance: Min assist;Min guard Gait Distance (Feet): 230 Feet Assistive device: Rolling walker (2 wheeled) Gait Pattern/deviations: Step-through pattern;Decreased step length - right;Decreased step length - left;Trunk flexed;Decreased dorsiflexion - left Gait velocity: decreased   General Gait Details: Cues for postural control. Pt unable to come fully upright secondary to back pain.  When given cognitive challenge during ambulation to find room, pt began running into obsticles in hallway, requiring min A to correct.   Stairs             Wheelchair Mobility    Modified Rankin (Stroke Patients Only) Modified Rankin (Stroke Patients Only) Pre-Morbid Rankin Score: Slight disability Modified Rankin: Moderately severe disability     Balance Overall balance assessment: Needs assistance Sitting-balance support: Feet supported Sitting balance-Leahy Scale: Fair Sitting balance - Comments: Pt maintained sitting balance in recliner with no UE support   Standing balance support: Bilateral upper extremity supported;During functional activity Standing balance-Leahy Scale: Poor Standing balance comment: able to stand with RW and min guard                            Cognition Arousal/Alertness: Awake/alert Behavior During Therapy: WFL for tasks assessed/performed Overall Cognitive Status: No family/caregiver present to determine baseline cognitive functioning Area of Impairment: Following commands;Safety/judgement;Problem solving;Attention;Memory                   Current Attention Level: Sustained Memory: Decreased short-term memory Following Commands: Follows one step commands consistently;Follows multi-step commands inconsistently Safety/Judgement: Decreased awareness of deficits Awareness: Intellectual Problem Solving: Difficulty sequencing;Requires verbal cues;Slow processing General Comments: Pt often difficult to understand. Unable to duel task when ambulating in hallway.      Exercises      General Comments        Pertinent Vitals/Pain Pain Assessment: No/denies pain    Home Living  Prior Function            PT Goals (current goals can now be found in the care plan section) Acute Rehab PT Goals Patient Stated Goal: None stated PT Goal Formulation: With patient Time For Goal Achievement:  12/09/18 Potential to Achieve Goals: Fair Progress towards PT goals: Progressing toward goals    Frequency    Min 4X/week      PT Plan Current plan remains appropriate    Co-evaluation              AM-PAC PT "6 Clicks" Mobility   Outcome Measure  Help needed turning from your back to your side while in a flat bed without using bedrails?: A Little Help needed moving from lying on your back to sitting on the side of a flat bed without using bedrails?: A Little Help needed moving to and from a bed to a chair (including a wheelchair)?: A Little Help needed standing up from a chair using your arms (e.g., wheelchair or bedside chair)?: A Little Help needed to walk in hospital room?: A Little Help needed climbing 3-5 steps with a railing? : Total 6 Click Score: 16    End of Session Equipment Utilized During Treatment: Gait belt Activity Tolerance: Patient tolerated treatment well Patient left: in chair;with call bell/phone within reach;with chair alarm set Nurse Communication: Mobility status PT Visit Diagnosis: Other symptoms and signs involving the nervous system (R29.898)     Time: 7124-5809 PT Time Calculation (min) (ACUTE ONLY): 22 min  Charges:  $Neuromuscular Re-education: 8-22 mins                    Benjiman Core, [place] Pager [phone] Acute Rehab  Allena Katz 12/09/2018, 3:33 PM

## 2018-12-09 NOTE — TOC Benefit Eligibility Note (Signed)
Transition of Care [facility]) Benefit Eligibility Note    Patient Details  Name: SAKIYA STEPKA MRN: [id number] Date of Birth: 05/12/1935   Medication/Dose: Arne [place]  2.5 M G BID  Covered?: Yes  Tier: 3 Drug  Prescription Coverage Preferred Pharmacy: [facility] [place] with Person/Company/Phone Number:: [employer]  @ [facility] # [phone]  Co-Pay: $ 172.86  Prior Approval: No  Deductible: Unmet  Additional Notes: XARELTO   20 MG DAILY, COVER- YES, CO-PAY- $172.86 , TIER- 3 DRUG, P/A- NO    [facility] Phone Number: 12/09/2018, 3:59 PM

## 2018-12-09 NOTE — Progress Notes (Signed)
  Date: 12/09/2018  Patient name: [name] [name]  Medical record number: [id number]  Date of birth: [birth year]   I have seen and evaluated this patient and I have discussed the plan of care with the house staff. Please see Dr. Lonzo Candy note for complete details. I concur with his findings and plan.  Hopeful for discharge tomorrow.  Will check with CM as to whether she will need an updated COVID-19 test.   Sid Falcon, MD 12/09/2018, 3:58 PM

## 2018-12-09 NOTE — Progress Notes (Addendum)
   Subjective: Patient was examined while sitting upright in her chair. We discussed her acceptance to a rehab facility. Also, her anticoagulation medication , Eliquis, w/insurance  would have a $250 deductible and payment of ~$50 . Patient said this is too expensive and would like to explore different options.    I will ask case management to see if there is a preferred Ila through her insurance.  Objective:  Vital signs in last 24 hours: Vitals:   12/08/18 1614 12/08/18 1949 12/08/18 2352 12/09/18 0335  BP: 129/69 118/69 119/63 136/75  Pulse: (!) 52 76 68 94  Resp: (!) 24 17 17 17   Temp: 97.6 F (36.4 C) (!) 97.5 F (36.4 C) 97.6 F (36.4 C) 98.4 F (36.9 C)  TempSrc: Oral Axillary Oral Oral  SpO2: 99% 98% 99% 97%  Weight:      Height:       Physical Exam Constitutional:      General: She is not in acute distress.    Appearance: Normal appearance.  Cardiovascular:     Rate and Rhythm: Normal rate. Rhythm irregular.  Pulmonary:     Effort: Pulmonary effort is normal.     Breath sounds: Normal breath sounds.  Neurological:     Mental Status: She is alert and oriented to person, place, and time.     Cranial Nerves: No cranial nerve deficit.     Sensory: No sensory deficit.     Comments: Strength test limited by previous shoulder injuries.  4/5 strength in upper and lower extremities bilaterally.  Psychiatric:        Mood and Affect: Mood normal.        Thought Content: Thought content normal.      Assessment/Plan:  Active Problems:   CVA (cerebral vascular accident) ([place])   Acute on chronic renal insufficiency   Hyperglycemia   Weak  84 year old female with a significant PMH of a fib, diabetes, CKD stage III, diastolic CHF, CAD, and left MCA stroke who presented for weakness after having a fall and was found to have a RMCA infarct.  Active Problems:  SWF:UXNAT MCA infarct in setting of A. fiboffEliquis 2/2cost barrier. A1c9.2,LipidsWNL.  Speech  continues to improve.  SLP still recommending dysphagia 1 diet - SLPevals - PT/OT rec SNF - Aspirin 325  Afib:Will resume Eliquis for now, while working on long term affordable anticoagulation plan at discharge. Rate controlled. - Eliquis 5mg  BID  HTN: Normotensive on medication regimen below.  - Amlodipine 10mg Daily - Lisinopril 10mg  Daily  AKI:Cr.1.69> 1.35. Baseline ~0.9.  Patient was on tube feeds and having diarrhea.  Creatinine has fluctuated, NG tube is out now.    Diabetes:On Metformin and 18U Lantus at home.  Off tube feeds.  Blood glucose 231 this morning.  We will continue to monitor.  Intake is variable at this point.  - Lantus 12Uqhs -SSI-M qhs  History of Falls:PT/OT, Fall Precautions Hypothyroidism:levothyroxine IV 151mcg  Diet -Dyspagia 1 diet DVT ppx-On Elequis CODE STATUS - FULL CODE.  Dispo: Discharge pending SNF placement

## 2018-12-09 NOTE — TOC Progression Note (Signed)
Transition of Care [facility]) - Progression Note    Patient Details  Name: [name] [name] MRN: [id number] Date of Birth: 1935/04/19  Transition of Care [facility]) CM/SW [place], [place] Phone Number: 12/09/2018, 4:31 PM  Clinical Narrative:  CSW contacted [facility] to see if they are able to offer on patient. They are out of network with [employer]. CSW contacted Levada Dy and provided her with information, and discussed bed offers received. CSW informed Levada Dy that she would call her this afternoon for a decision.  CSW called Levada Dy later in the day for decision. Levada Dy wants to discuss with her brother and he has been unavailable. Levada Dy will discuss with her brother tonight and have a decision for CSW by tomorrow.    Expected Discharge Plan: Skilled Nursing Facility Barriers to Discharge: Ship broker, Continued Medical Work up  Expected Discharge Plan and Services Expected Discharge Plan: [place] In-house Referral: Clinical Social Work Discharge Planning Services: NA Post Acute Care Choice: [place] arrangements for the past 2 months: Single Family Home                                       Social Determinants of Health (SDOH) Interventions    Readmission Risk Interventions No flowsheet data found.

## 2018-12-10 DIAGNOSIS — R32 Unspecified urinary incontinence: Secondary | ICD-10-CM

## 2018-12-10 LAB — GLUCOSE, CAPILLARY
Glucose-Capillary: 104 mg/dL — ABNORMAL HIGH (ref 70–99)
Glucose-Capillary: 232 mg/dL — ABNORMAL HIGH (ref 70–99)
Glucose-Capillary: 159 mg/dL — ABNORMAL HIGH (ref 70–99)
Glucose-Capillary: 181 mg/dL — ABNORMAL HIGH (ref 70–99)

## 2018-12-10 MED ORDER — ASPIRIN EC 81 MG PO TBEC
81.0000 mg | DELAYED_RELEASE_TABLET | Freq: Every day | ORAL | Status: DC
  Administered 2018-12-11 – 2018-12-16 (×6): 81 mg via ORAL
  Filled 2018-12-10 (×6): qty 1

## 2018-12-10 NOTE — Progress Notes (Signed)
   Subjective: Pt seen on rounds this AM. Sleeping on entry. Very pleasant this AM. We discussed trying to find an affordable option for blood thinner medication.  No pain today and feels stronger. Worried about bruise on her forehead. She was reassured that it was improving.   Objective:  Vital signs in last 24 hours: Vitals:   12/09/18 1651 12/09/18 2018 12/10/18 0018 12/10/18 0345  BP: (!) 148/72 (!) 96/57 140/73 (!) 163/57  Pulse: (!) 50 (!) 50 61 (!) 56  Resp: 16 17 16 20   Temp: (!) 97.4 F (36.3 C) 97.7 F (36.5 C) (!) 97.4 F (36.3 C) 98.1 F (36.7 C)  TempSrc: Oral  Oral Oral  SpO2: 98% 99% 98% 98%  Weight:   86.5 kg   Height:       Physical Exam Constitutional:      General: She is not in acute distress. Cardiovascular:     Rate and Rhythm: Normal rate. Rhythm irregular.  Pulmonary:     Effort: Pulmonary effort is normal.     Breath sounds: Normal breath sounds.  Abdominal:     General: Bowel sounds are normal.     Palpations: Abdomen is soft.  Neurological:     Mental Status: She is alert.     Assessment/Plan:  Active Problems:   CVA (cerebral vascular accident) ([place])   Acute on chronic renal insufficiency   Hyperglycemia   Weak  84 year old female with a significant PMH of a fib, diabetes, CKD stage III, diastolic CHF, CAD, and left MCA stroke who presented for weakness after having a fall and was found to have a RMCA infarct.  Active Problems:  MPN:TIRWE MCA infarct in setting of A. fiboffEliquis 2/2cost barrier. A1c9.2,LipidsWNL.  PT OT SLP continue to work with patient.  Discharge pending SNF placement.  Going forward patient will need to be on anticoagulation.  Currently working on affordable option with our pharmacy consult and case management. -SLP/PT/OT -Aspirin 81 mg  Afib:On Eliquis.  Patient on amiodarone.  Not on rate control, normal rate. - Eliquis 5mg  BID -Amiodarone 200 mg  RXV:QMGQQPYP blood pressures  per chart.  We  will continue current regimen - Amlodipine 10mg Daily - Lisinopril 10mg  Daily  AKI:Cr.1.69> 1.42. Baseline ~0.9 in 2017.  Creatinine did not respond further to fluids.  GFR ~37 . Appreciate nursing attempts to collect but patient is having incontinence. Patient being ambulated to bathroom and purewick being used.   Diabetes:On Metformin and 18U Lantus at home. - Lantus 12Uqhs -SSI-M qhs  History of Falls:PT/OT, Fall Precautions Hypothyroidism:levothyroxine IV 115mcg  Diet -Dyspagia 1 diet VTE ppx-On Elequis CODE STATUS - FULL CODE.  Dispo:Discharge pending SNF placement  [name], MD PGY1  [phone]

## 2018-12-10 NOTE — Progress Notes (Signed)
  Date: 12/10/2018  Patient name: CAMELIA STELZNER  Medical record number: [id number]  Date of birth: Jan 21, 1935   I have seen and evaluated this patient and I have discussed the plan of care with the house staff. Please see Dr. Lonzo Candy note for complete details. I concur with his findings and plan.  Pending SNF at discharge.  Patient is medically ready to go.  Will need SLP and PT at least at the facility.   Sid Falcon, MD 12/10/2018, 11:31 AM

## 2018-12-10 NOTE — Progress Notes (Signed)
Physical Therapy Treatment Patient Details Name: [name] [name] MRN: [id number] DOB: 1935/01/21 Today's Date: 12/10/2018    History of Present Illness Pt is an 84 y/o female who presents with stroke-like symptoms. MRI revealed large, acute R MCA infarct with petechial hemorrhage, as well as remote L MCA infarct and remote lacunar infarcts in L thalamus and R pons. PMH significant for L shoulder fracture s/p surgery 2014, RA, PAF, OA, hypothyroidism, DM, CKD, CHF, cerebral infarction, CAD, R TKR.    PT Comments    Patient seen for mobility progression. Continue to progress as tolerated with anticipated d/c to SNF for further skilled PT services.     Follow Up Recommendations  SNF;Supervision/Assistance - 24 hour     Equipment Recommendations  None recommended by PT    Recommendations for Other Services       Precautions / Restrictions Precautions Precautions: Fall Precaution Comments: L inattention, monitor O2 sats Restrictions Weight Bearing Restrictions: No    Mobility  Bed Mobility Overal bed mobility: Needs Assistance Bed Mobility: Sit to Supine     Supine to sit: Min guard     General bed mobility comments: increased time and effort; min guard for safety  Transfers Overall transfer level: Needs assistance Equipment used: Rolling walker (2 wheeled) Transfers: Sit to/from Stand Sit to Stand: Min assist         General transfer comment: assist to steady from recliner and assist to power up from commode; cues for safe hand placement  Ambulation/Gait Ambulation/Gait assistance: Min assist Gait Distance (Feet): 140 Feet Assistive device: Rolling walker (2 wheeled) Gait Pattern/deviations: Step-through pattern;Decreased step length - right;Decreased step length - left;Trunk flexed;Decreased dorsiflexion - left Gait velocity: decreased   General Gait Details: cues for upright posture and maintaining safe proximity to RW; assist to steady; cues to redirect in  Engineer, technical sales    Modified Rankin (Stroke Patients Only) Modified Rankin (Stroke Patients Only) Pre-Morbid Rankin Score: Slight disability Modified Rankin: Moderately severe disability     Balance Overall balance assessment: Needs assistance Sitting-balance support: Feet supported Sitting balance-Leahy Scale: Fair     Standing balance support: Bilateral upper extremity supported;During functional activity Standing balance-Leahy Scale: Poor                              Cognition Arousal/Alertness: Awake/alert Behavior During Therapy: WFL for tasks assessed/performed Overall Cognitive Status: No family/caregiver present to determine baseline cognitive functioning Area of Impairment: Following commands;Safety/judgement;Problem solving;Attention;Memory                   Current Attention Level: Sustained Memory: Decreased short-term memory Following Commands: Follows one step commands consistently;Follows multi-step commands inconsistently Safety/Judgement: Decreased awareness of deficits Awareness: Intellectual Problem Solving: Difficulty sequencing;Requires verbal cues;Slow processing General Comments: pt repeats stories often; Pt often difficult to understand. Unable to duel task when ambulating in hallway.      Exercises      General Comments        Pertinent Vitals/Pain Pain Assessment: No/denies pain    Home Living                      Prior Function            PT Goals (current goals can now be found in the care plan section) Progress towards PT goals: Progressing  toward goals    Frequency    Min 4X/week      PT Plan Current plan remains appropriate    Co-evaluation              AM-PAC PT "6 Clicks" Mobility   Outcome Measure  Help needed turning from your back to your side while in a flat bed without using bedrails?: A Little Help needed moving from  lying on your back to sitting on the side of a flat bed without using bedrails?: A Little Help needed moving to and from a bed to a chair (including a wheelchair)?: A Little Help needed standing up from a chair using your arms (e.g., wheelchair or bedside chair)?: A Little Help needed to walk in hospital room?: A Little Help needed climbing 3-5 steps with a railing? : Total 6 Click Score: 16    End of Session Equipment Utilized During Treatment: Gait belt Activity Tolerance: Patient tolerated treatment well Patient left: with call bell/phone within reach;in bed;with bed alarm set Nurse Communication: Mobility status PT Visit Diagnosis: Other symptoms and signs involving the nervous system (R29.898)     Time: 8119-1478 PT Time Calculation (min) (ACUTE ONLY): 29 min  Charges:  $Gait Training: 8-22 mins $Therapeutic Activity: 8-22 mins                     Erline Levine, PTA Acute Rehabilitation Services Pager: [phone] Office: [phone]     Carolynne Edouard 12/10/2018, 4:04 PM

## 2018-12-10 NOTE — Care Management Important Message (Signed)
Important Message  Patient Details  Name: [name] [name] MRN: [id number] Date of Birth: 1934-11-27   Medicare Important Message Given:  Yes     Orbie Pyo 12/10/2018, 2:17 PM

## 2018-12-11 LAB — GLUCOSE, CAPILLARY
Glucose-Capillary: 94 mg/dL (ref 70–99)
Glucose-Capillary: 224 mg/dL — ABNORMAL HIGH (ref 70–99)
Glucose-Capillary: 194 mg/dL — ABNORMAL HIGH (ref 70–99)
Glucose-Capillary: 170 mg/dL — ABNORMAL HIGH (ref 70–99)

## 2018-12-11 NOTE — Progress Notes (Signed)
Pt seen clinically at bedside today, continued improvement with speech intelligibility and swallowing.  Full note to follow.  Still benefits from moderate verbal cues to clear oral pocketing on left.  No indication of aspiration, minimal throat clear x1 and anterior spill x2 with coffee.  Will advance to dys3/thin and call later for family to bring in dentures.  Thanks.  Luanna Salk, [facility] SLP Acute Rehab Services Pager [phone] Office [phone]

## 2018-12-11 NOTE — Progress Notes (Signed)
Occupational Therapy Treatment Patient Details Name: [name] [name] MRN: [id number] DOB: 1934/07/13 Today's Date: 12/11/2018    History of present illness Pt is an 84 y/o female who presents with stroke-like symptoms. MRI revealed large, acute R MCA infarct with petechial hemorrhage, as well as remote L MCA infarct and remote lacunar infarcts in L thalamus and R pons. PMH significant for L shoulder fracture s/p surgery 2014, RA, PAF, OA, hypothyroidism, DM, CKD, CHF, cerebral infarction, CAD, R TKR.   OT comments  Pt seen for ADL and higher level cognitive. Pt admits that she is not 100% back to normal due to weakness and poor safety awareness. Pt performing toilet hygiene and grooming at sink with minguardA to [place]. Pt able to answer all prior level of function questions. Pt would be unsafe to cook due to poor awareness of new deficits in slow to respond and poor mobility. Pt would benefit from continued OT skilled services for ADL, mobility. OT following acutely.   Follow Up Recommendations  SNF    Equipment Recommendations  None recommended by OT    Recommendations for Other Services      Precautions / Restrictions Precautions Precautions: Fall Restrictions Weight Bearing Restrictions: No       Mobility Bed Mobility               General bed mobility comments: up in recliner  Transfers Overall transfer level: Needs assistance Equipment used: Rolling walker (2 wheeled) Transfers: Sit to/from Stand Sit to Stand: Min guard         General transfer comment: increasing progress    Balance Overall balance assessment: Needs assistance Sitting-balance support: Feet supported Sitting balance-Leahy Scale: Fair     Standing balance support: Bilateral upper extremity supported Standing balance-Leahy Scale: Fair                             ADL either performed or assessed with clinical judgement   ADL Overall ADL's : Needs assistance/impaired                                     Functional mobility during ADLs: Min guard;Cueing for safety;Cueing for sequencing;Rolling walker General ADL Comments: Pt required cueing for safety and RW management     Vision       Perception     Praxis      Cognition Arousal/Alertness: Awake/alert Behavior During Therapy: WFL for tasks assessed/performed Overall Cognitive Status: Impaired/Different from baseline Area of Impairment: Awareness                           Awareness: Intellectual   General Comments: Pt repeating stories. Pt continues to have slurred speech.        Exercises     Shoulder Instructions       General Comments      Pertinent Vitals/ Pain       Pain Assessment: No/denies pain  Home Living                                          Prior Functioning/Environment              Frequency  Min 2X/week  Progress Toward Goals  OT Goals(current goals can now be found in the care plan section)  Progress towards OT goals: Progressing toward goals  Acute Rehab OT Goals Patient Stated Goal: to go home OT Goal Formulation: With patient Time For Goal Achievement: 12/16/18 Potential to Achieve Goals: Good ADL Goals Pt Will Perform Grooming: with supervision Pt Will Perform Upper Body Bathing: with supervision Pt Will Perform Lower Body Bathing: sit to/from stand;with min assist Pt Will Perform Upper Body Dressing: with supervision Pt Will Perform Lower Body Dressing: sit to/from stand;with min assist Pt Will Transfer to Toilet: with min assist Pt Will Perform Toileting - Clothing Manipulation and hygiene: with min assist  Plan Discharge plan remains appropriate    Co-evaluation                 AM-PAC OT "6 Clicks" Daily Activity     Outcome Measure   Help from another person eating meals?: None Help from another person taking care of personal grooming?: A Little Help from another  person toileting, which includes using toliet, bedpan, or urinal?: A Little Help from another person bathing (including washing, rinsing, drying)?: A Little Help from another person to put on and taking off regular upper body clothing?: A Little Help from another person to put on and taking off regular lower body clothing?: A Little 6 Click Score: 19    End of Session Equipment Utilized During Treatment: Rolling walker;Gait belt  OT Visit Diagnosis: Unsteadiness on feet (R26.81);Repeated falls (R29.6);Muscle weakness (generalized) (M62.81);History of falling (Z91.81)   Activity Tolerance Patient tolerated treatment well   Patient Left in chair;with call bell/phone within reach;with chair alarm set   Nurse Communication Mobility status        Time: 1420-1445 OT Time Calculation (min): 25 min  Charges: OT General Charges $OT Visit: 1 Visit OT Treatments $Self Care/Home Management : 8-22 mins $Therapeutic Activity: 8-22 mins  [name] [name]) [name] [name] OTR/L Acute Rehabilitation Services Pager: [phone] Office: [phone]    [name] [name] [name] [name] 12/11/2018, 4:02 PM

## 2018-12-11 NOTE — Progress Notes (Signed)
   Subjective: Pt seen on rounds this AM. Eating breakfast, sitting up in the chair at the bedside. Appears comfortable. We discussed her anticoagulation options moving forwards.  Eliquis may be too expensive to afford. We explained that after her deductible kicks in, the Eliquis would become cheaper. Despite that, she still feels like it would be too expensive to afford. We also discussed Warfarin and its frequent INR checks. Also touched on aspirin as well, and how this is the least desirable option.   Option 1: [place] , downside is cost for patient Option 2: Warfarin , one downside is patient needs to be monitored frequently Option 3: High dose Aspirin , reduces stroke risk < other options.   PCP: Dr. Quentin Cornwall in [place]. Her son takes her to those appointments.   Objective:  Vital signs in last 24 hours: Vitals:   12/10/18 1601 12/10/18 2007 12/11/18 0029 12/11/18 0353  BP: (!) 118/54 (!) 147/58 (!) 142/76 (!) 155/58  Pulse: (!) 49 84 (!) 59 (!) 46  Resp: 20 18 18 18   Temp: 97.6 F (36.4 C) 97.8 F (36.6 C) (!) 97.4 F (36.3 C) 97.6 F (36.4 C)  TempSrc: Oral Oral Oral Oral  SpO2: 99% 96% 97% 98%  Weight:      Height:       Physical Exam Constitutional:      General: She is not in acute distress.    Appearance: Normal appearance.  Cardiovascular:     Rate and Rhythm: Normal rate. Rhythm irregular.  Pulmonary:     Effort: Pulmonary effort is normal.     Breath sounds: Normal breath sounds.  Abdominal:     General: Bowel sounds are normal.     Palpations: Abdomen is soft.  Neurological:     Mental Status: She is alert.     Sensory: No sensory deficit.     Comments: 4/5 strength bilateral UE and LE.       Assessment/Plan:  Active Problems:   CVA (cerebral vascular accident) ([place])   Acute on chronic renal insufficiency   Hyperglycemia   Weak  84 year old female with a significant PMH of a fib, diabetes, CKD stage III, diastolic CHF, CAD, and left MCA stroke who  presented for weakness after having a fall and was found to have a RMCA infarct.  Active Problems:  HCW:CBJSE MCA infarct in setting of A. fiboffEliquis 2/2cost barrier. A1c9.2,LipidsWNL.  PT OT SLP continue to work with patient.  Discharge pending SNF placement.  Going forward patient will need to be on anticoagulation.  Currently discussing options. Plans to call family today to further evaluate patients support system once she is home.  -SLP advanced diet today to dysphagia 3 -SLP/PT/OT -Aspirin 81 mg  Afib:On Eliquis.  Patient on amiodarone.  Not on rate control, normal rate. - Eliquis 5mg  BID -Amiodarone 200 mg  HTN:We will continue current regimen - Amlodipine 10mg Daily - Lisinopril 10mg  Daily  AKI:Cr.1.69> 1.42. Baseline ~0.9 in 2017.  Creatinine did not respond further to fluids.  GFR ~37   Diabetes:On Metformin and 18U Lantus at home. - Lantus 12Uqhs -SSI-M qhs  History of Falls:PT/OT, Fall Precautions Hypothyroidism:levothyroxine IV 116mcg  Diet -Dyspagia 3 Diet VTE ppx-On Elequis CODE STATUS - FULL CODE.  Dispo:Discharge pending SNF placement  [name], MD PGY1  [phone]

## 2018-12-11 NOTE — Consult Note (Signed)
  Student Pharmacist rounding with the IMTS/B2/[place] service was asked to consult regarding anticoagulation therapy pending discharge to SNF. I have evaluated her baseline laboratory values as documented in her history and physical exam. The patient states that she is unable to afford her direct-oral anticoagulation therapy as prescribed by our team and her previous provider, upon further investigation into the cost of these medications I offer the following options for treatment moving forward. - Patient can remain on DOAC therapy with Eliquis (Apixaban) and attempt to qualify for a manufacturer's indigent patient assistance program which she may not meet the criteria for.  - Patient can establish care at the Internal Medicine outpatient clinic where she will be commenced on warfarin and followed closely by our IM/Anticoagulation team. This will require frequent visits to ensure safety and efficacy of warfarin therefore the patient will need to establish reliable transportation.  - If the patient is going to have [place] services after discharge from the SNF, she may be able to start warfarin and have the [place] provider monitor her INR at home. This would require the patient to be able to adjust her warfarin dose and/or understand when she needs to change her dose in order to maintain safety and efficacy.  - Finally, if the patient decides that none of the options are feasible the team may decide to commence high-dose aspirin (325 mg enteric coated daily) based upon meta-analysis performed by Tamera Stands. al in a study published in the Annals of Internal Medicine 2007. Aspirin alone provided a 19% relative risk reduction when compared to placebo (no treatment), whereas warfarin provided a 64% RRR when compared to no treatment. - DOAC therapy provides even greater risk reduction and stroke protection.  [place]

## 2018-12-11 NOTE — Progress Notes (Signed)
  Date: 12/11/2018  Patient name: [name] [name]  Medical record number: [id number]  Date of birth: 1934/08/21   I have seen and evaluated this patient and I have discussed the plan of care with the house staff. Please see Dr. Lonzo Candy note for complete details. I concur with his findings and plan.  Patient is medically ready for discharge.  She will likely need to be on coumadin on discharge, or aspirin, however, this will be up to her ultimately.  We went through options with her today.  Coumadin and the DOACs have much better track record for stroke prevention, however, she does not seem willing today to consider INR checks and frequent follow up involved with coumadin.  Continued discussion with her will be ongoing as we wait for SNF placement for her.  Will need discussion with family as well.    Sid Falcon, MD 12/11/2018, 4:22 PM

## 2018-12-11 NOTE — Progress Notes (Signed)
  Speech Language Pathology Treatment: Dysphagia  Patient Details Name: [name] [name] MRN: [id number] DOB: 09/09/1934 Today's Date: 12/11/2018 Time: 0753-0820 SLP Time Calculation (min) (ACUTE ONLY): 27 min  Assessment / Plan / Recommendation Clinical Impression  Patient continues with improved speech intelligiblity thus lingual movement strengthening as well as strength of phonation - indicative of improved airway protection~ She does unfortunatley continue to orally pocket foods and needs cues to clear using purees - but this is improved compared to prior visit.  Pt also continues with decreased attention causing her to be distracted by rain, etc outside of the window. She is verbose and will perform better swallowing if she eats alone to decrease her risk.  Recommend RN check for oral residuals after completion of meal however for maximal airway protection. No indications of airway compromise and more timely swallow continues.  Subtle throat clearing observed which pt states is normal, do suspect again baseline deficit prior to cva from pt's report.  Pt advised she would follow precautions closely and clinical reasoning.  HPI HPI: 84 yo female adm to [facility] *transfer* with left sided weakness. Large area of acute right MCA territory infarct centered at the posterior insula and posterior frontal lobe. There is petechial hemorrhage superimposed. Large remote left MCA territory infarct affecting insula and lateral frontal lobe as well. Remote lacunar infarct in the left thalamus and right pons.  Pt did not pass [facility] swallow screen and thus SLP eval ordered.Pt also with h/o HTN, dyspnea, pna, GERD, UTI, bradycardia, renal fx.  Pt reports she lives alone in a trailer with a bird.Pt had a Cortrak which was removed yesterday and she started on dys1/nectar diet.  Intake at breakfast today was everything except the eggs.  No severe oral pocketing reported today.      SLP Plan  Continue with current plan of  care       Recommendations  Diet recommendations: Dysphagia 3 (mechanical soft);Thin liquid Liquids provided via: Cup;Straw Medication Administration: Whole meds with puree Supervision: Patient able to self feed;Intermittent supervision to cue for compensatory strategies Compensations: Lingual sweep for clearance of pocketing;Slow rate;Small sips/bites Postural Changes and/or Swallow Maneuvers: Seated upright 90 degrees;Upright 30-60 min after meal                Oral Care Recommendations: Oral care QID Follow up Recommendations: Skilled Nursing facility;24 hour supervision/assistance SLP Visit Diagnosis: Dysphagia, oropharyngeal phase (R13.12) Attention and concentration deficit following: Cerebral infarction Plan: Continue with current plan of care       GO                Macario Golds 12/11/2018, 9:22 AM

## 2018-12-12 LAB — APTT: aPTT: 33 seconds (ref 24–36)

## 2018-12-12 LAB — GLUCOSE, CAPILLARY
Glucose-Capillary: 83 mg/dL (ref 70–99)
Glucose-Capillary: 150 mg/dL — ABNORMAL HIGH (ref 70–99)
Glucose-Capillary: 194 mg/dL — ABNORMAL HIGH (ref 70–99)
Glucose-Capillary: 271 mg/dL — ABNORMAL HIGH (ref 70–99)

## 2018-12-12 LAB — CBC
HCT: 32.5 % — ABNORMAL LOW (ref 36.0–46.0)
Hemoglobin: 10.5 g/dL — ABNORMAL LOW (ref 12.0–15.0)
MCH: 30.5 pg (ref 26.0–34.0)
MCHC: 32.3 g/dL (ref 30.0–36.0)
MCV: 94.5 fL (ref 80.0–100.0)
Platelets: 354 10*3/uL (ref 150–400)
RBC: 3.44 MIL/uL — ABNORMAL LOW (ref 3.87–5.11)
RDW: 12.8 % (ref 11.5–15.5)
WBC: 7.1 10*3/uL (ref 4.0–10.5)
nRBC: 0 % (ref 0.0–0.2)

## 2018-12-12 LAB — PROTIME-INR
INR: 1.2 (ref 0.8–1.2)
Prothrombin Time: 15.3 seconds — ABNORMAL HIGH (ref 11.4–15.2)

## 2018-12-12 LAB — HEPARIN LEVEL (UNFRACTIONATED): Heparin Unfractionated: >2.2 IU/mL — ABNORMAL HIGH (ref 0.30–0.70)

## 2018-12-12 LAB — CREATININE, SERUM
Creatinine, Ser: 1.49 mg/dL — ABNORMAL HIGH (ref 0.44–1.00)
GFR calc Af Amer: 38 mL/min — ABNORMAL LOW (ref >60)
GFR calc non Af Amer: 33 mL/min — ABNORMAL LOW (ref >60)

## 2018-12-12 MED ORDER — HEPARIN (PORCINE) 25000 UT/250ML-% IV SOLN: 850.0000 [IU]/h | INTRAVENOUS | Status: DC

## 2018-12-12 MED ORDER — HEPARIN (PORCINE) 25000 UT/250ML-% IV SOLN
850.0000 [IU]/h | INTRAVENOUS | Status: AC
  Administered 2018-12-13: 850 [IU]/h via INTRAVENOUS
  Administered 2018-12-14: 800 [IU]/h via INTRAVENOUS
  Administered 2018-12-16: 850 [IU]/h via INTRAVENOUS
  Filled 2018-12-12 (×3): qty 250

## 2018-12-12 MED ORDER — WARFARIN SODIUM 3 MG PO TABS
3.0000 mg | ORAL_TABLET | Freq: Once | ORAL | Status: AC
  Administered 2018-12-12: 3 mg via ORAL
  Filled 2018-12-12: qty 1

## 2018-12-12 MED ORDER — WARFARIN - PHARMACIST DOSING INPATIENT: Freq: Every day | Status: DC

## 2018-12-12 NOTE — Progress Notes (Signed)
   Subjective: Patient lying in bed rounds.  She reports making good progress with walking yesterday.  No concerns this morning.  Attempted to call Ms. Gosdin daughter, Levada Dy, to discuss anticoagulation plans moving forward however there was no answer.  Objective:  Vital signs in last 24 hours: Vitals:   12/11/18 2107 12/12/18 0016 12/12/18 0418 12/12/18 0530  BP: (!) 143/71 (!) 124/59  125/80  Pulse: 60 (!) 48  95  Resp: 18 18  18   Temp: 97.7 F (36.5 C) 98.6 F (37 C)  97.8 F (36.6 C)  TempSrc: Oral Oral  Oral  SpO2: 98% 97%  97%  Weight:   88.6 kg   Height:       Physical Exam Constitutional:      General: She is not in acute distress. Cardiovascular:     Rate and Rhythm: Normal rate. Rhythm irregular.  Pulmonary:     Effort: Pulmonary effort is normal.     Breath sounds: Normal breath sounds.  Abdominal:     General: Bowel sounds are normal.     Palpations: Abdomen is soft.  Neurological:     Mental Status: She is alert. Mental status is at baseline.      Assessment/Plan:  Active Problems:   CVA (cerebral vascular accident) ([place])   Acute on chronic renal insufficiency   Hyperglycemia   Weak  84 year old female with significant past medical history of A. Fib, DM2, CKD stage III, diastolic CHF, CAD, and left MCA stroke who presented for weakness after having a fall was found to have a right MCA infarct  XNT:ZGYFV MCA infarct in setting of A. fiboffEliquis 2/2cost barrier. A1c9.2,LipidsWNL. PT OT SLP continue to work with patient. Discharge pending SNF placement.  -dysphagia 3 diet -SLP/PT/OT -Aspirin 81 mg  Afib:On Eliquis. Patient on amiodarone. Not on rate control, normal rate. Today we will stop Eliquis.  Pharmacy to start Heparin  bridge to warfarin  . - Heparin bridge to warfarin -stop Eliquis -Amiodarone 200 mg  HTN:We will continue current regimen - Amlodipine 10mg Daily - Lisinopril 10mg  Daily  AKI:Cr.1.69> 1.42.  Baseline ~0.9in 2017.Creatinine did notrespond furtherto fluids. GFR ~37  Diabetes:On Metformin and 18U Lantus at home. - Lantus 12Uqhs -SSI-M qhs  History of Falls:PT/OT, Fall Precautions Hypothyroidism:levothyroxine IV 172mcg  Diet -Dyspagia 3 Diet VTEppx-Heparin bridge to Warfarin  CODE STATUS - FULL CODE.  Dispo:Discharge pending SNF placement  [name], MD PGY1  [phone]

## 2018-12-12 NOTE — Progress Notes (Signed)
ANTICOAGULATION CONSULT NOTE - Initial Consult  Pharmacy Consult for Apixaban >> [place] bridge + Warf Indication: Afib, new CVA  Allergies  Allergen Reactions  . Oxycodone Nausea And Vomiting    Patient Measurements: Height: 5\' 3"  (160 cm) Weight: 195 lb 5.2 oz (88.6 kg) IBW/kg (Calculated) : 52.4 Heparin Dosing Weight: 72.4 kg  Vital Signs: Temp: 97.8 F (36.6 C) (07/25 1339) Temp Source: Oral (07/25 1339) BP: 125/58 (07/25 1339) Pulse Rate: 47 (07/25 1339)  Labs: No results for input(s): HGB, HCT, PLT, APTT, LABPROT, INR, HEPARINUNFRC, HEPRLOWMOCWT, CREATININE, CKTOTAL, CKMB, TROPONINIHS in the last 72 hours.  Estimated Creatinine Clearance: 32.8 mL/min (A) (by C-G formula based on SCr of 1.42 mg/dL (H)).   Medical History: Past Medical History:  Diagnosis Date  . Asthma   . CAD (coronary artery disease)    a. 2000 s/p PCI RCA;  b. 2005 PCI of RCA 2/2 ISR; c. 04/2013 Neg MV;  d. 05/2013 Cath: LM 20, LAD 30p, LCX 20p, OM1 90 small, RCA 74m (PTCA - FFR 0.86), PDA 40.  Marland Kitchen Cerebral infarction ([place])   . Chronic diastolic CHF (congestive heart failure) ([place])    a. 10/2013 Echo: EF 60-65%, no rwma, Gr1 DD, mild MR, mildly dil LA.  . CKD (chronic kidney disease), stage III ([place])   . DM (diabetes mellitus) ([place])   . Dyslipidemia   . GERD (gastroesophageal reflux disease)   . Hypertensive heart disease   . Hypothyroidism    hx  . Obesity   . Osteoarthritis   . PAF (paroxysmal atrial fibrillation) ([place])    a. s/p dccv 04/2014;  b. amio/eliquis;  c. 06/2013 bb/ccb d/c 2/2 symptomatic bradycardia; d. 04/2015 recurrent AF->amio increased/bb resumed.  . Rheumatoid arthritis(714.0)   . Right rib fracture    a. 04/2015.  Marland Kitchen Shoulder fracture, left    a. Dr. Mack Guise    Assessment: 59 YOF with history of Afib noncompliant with Apixaban PTA due to cost admitted on 7/14 with a new CVA. Pharmacy now consulted to start a Heparin bridge with warfarin.   The patient's last Apixaban  dose was 5 mg at 2458 earlier today. Will plan to initiate the Heparin bridge 12 hours after the last Apixaban dose. Baseline INR is 1.2, SCr 1.49, aPTT 33, HL falsely elevated with recent Apix use, with monitor initially with aPTT  Will initiate a lower warfarin dose due to age>80 and expected drug interaction with amiodarone.   Goal of Therapy:  INR 2-3 Heparin level 0.3-0.5 units/ml aPTT 66-85 seconds Monitor platelets by anticoagulation protocol: Yes   Plan:  - Start Heparin at a rate of 850 units/hr (8.5 ml/hr) - Warfarin 3 mg x 1 dose at 1800 today - Daily aPTT/HL - Will continue to monitor for any signs/symptoms of bleeding and will follow up with aPTT in 8 hours after starting and INR in the AM  Thank you for allowing pharmacy to be a part of this patient's care.  Alycia Rossetti, PharmD, BCPS Clinical Pharmacist Clinical phone for 12/12/2018: [phone] 12/12/2018 2:55 PM   **Pharmacist phone directory can now be found on [email or web address] ([email or web address]).  Listed under [place].

## 2018-12-13 DIAGNOSIS — I5030 Unspecified diastolic (congestive) heart failure: Secondary | ICD-10-CM

## 2018-12-13 LAB — GLUCOSE, CAPILLARY
Glucose-Capillary: 92 mg/dL (ref 70–99)
Glucose-Capillary: 167 mg/dL — ABNORMAL HIGH (ref 70–99)
Glucose-Capillary: 205 mg/dL — ABNORMAL HIGH (ref 70–99)
Glucose-Capillary: 196 mg/dL — ABNORMAL HIGH (ref 70–99)

## 2018-12-13 LAB — CBC
HCT: 30 % — ABNORMAL LOW (ref 36.0–46.0)
Hemoglobin: 9.7 g/dL — ABNORMAL LOW (ref 12.0–15.0)
MCH: 30.3 pg (ref 26.0–34.0)
MCHC: 32.3 g/dL (ref 30.0–36.0)
MCV: 93.8 fL (ref 80.0–100.0)
Platelets: 352 10*3/uL (ref 150–400)
RBC: 3.2 MIL/uL — ABNORMAL LOW (ref 3.87–5.11)
RDW: 12.9 % (ref 11.5–15.5)
WBC: 7.3 10*3/uL (ref 4.0–10.5)
nRBC: 0 % (ref 0.0–0.2)

## 2018-12-13 LAB — PROTIME-INR
INR: 1.3 — ABNORMAL HIGH (ref 0.8–1.2)
Prothrombin Time: 15.8 seconds — ABNORMAL HIGH (ref 11.4–15.2)

## 2018-12-13 LAB — APTT
aPTT: 67 seconds — ABNORMAL HIGH (ref 24–36)
aPTT: 82 seconds — ABNORMAL HIGH (ref 24–36)

## 2018-12-13 LAB — HEPARIN LEVEL (UNFRACTIONATED): Heparin Unfractionated: 1.82 IU/mL — ABNORMAL HIGH (ref 0.30–0.70)

## 2018-12-13 MED ORDER — WARFARIN SODIUM 3 MG PO TABS
3.0000 mg | ORAL_TABLET | Freq: Once | ORAL | Status: AC
  Administered 2018-12-13: 3 mg via ORAL
  Filled 2018-12-13: qty 1

## 2018-12-13 NOTE — Progress Notes (Signed)
[place] for Apixaban >> Heparin bridge + Warf Indication: Afib, new CVA  Allergies  Allergen Reactions  . Oxycodone Nausea And Vomiting    Patient Measurements: Height: 5\' 3"  (160 cm) Weight: 195 lb 5.2 oz (88.6 kg) IBW/kg (Calculated) : 52.4 Heparin Dosing Weight: 72.4 kg  Vital Signs:    Labs: Recent Labs    12/12/18 1345 12/13/18 0636 12/13/18 1609  HGB 10.5*  --  9.7*  HCT 32.5*  --  30.0*  PLT 354  --  352  APTT 33 67* 82*  LABPROT 15.3* 15.8*  --   INR 1.2 1.3*  --   HEPARINUNFRC >2.20* 1.82*  --   CREATININE 1.49*  --   --     Estimated Creatinine Clearance: 31.3 mL/min (A) (by C-G formula based on SCr of 1.49 mg/dL (H)).   Medical History: Past Medical History:  Diagnosis Date  . Asthma   . CAD (coronary artery disease)    a. 2000 s/p PCI RCA;  b. 2005 PCI of RCA 2/2 ISR; c. 04/2013 Neg MV;  d. 05/2013 Cath: LM 20, LAD 30p, LCX 20p, OM1 90 small, RCA 60m (PTCA - FFR 0.86), PDA 40.  Marland Kitchen Cerebral infarction ([place])   . Chronic diastolic CHF (congestive heart failure) ([place])    a. 10/2013 Echo: EF 60-65%, no rwma, Gr1 DD, mild MR, mildly dil LA.  . CKD (chronic kidney disease), stage III ([place])   . DM (diabetes mellitus) ([place])   . Dyslipidemia   . GERD (gastroesophageal reflux disease)   . Hypertensive heart disease   . Hypothyroidism    hx  . Obesity   . Osteoarthritis   . PAF (paroxysmal atrial fibrillation) ([place])    a. s/p dccv 04/2014;  b. amio/eliquis;  c. 06/2013 bb/ccb d/c 2/2 symptomatic bradycardia; d. 04/2015 recurrent AF->amio increased/bb resumed.  . Rheumatoid arthritis(714.0)   . Right rib fracture    a. 04/2015.  Marland Kitchen Shoulder fracture, left    a. Dr. Mack Guise    Assessment: 46 YOF with history of Afib noncompliant with Apixaban PTA due to cost admitted on 7/14 with a new CVA. Pharmacy now consulted to start a Heparin bridge with warfarin.   aPTT trend up to upper end of therapeutic at 82 seconds on  850 units/hr  Goal of Therapy:  INR 2-3 Heparin level 0.3-0.5 units/ml aPTT 66-85 seconds Monitor platelets by anticoagulation protocol: Yes   Plan:  Decrease heparin gtt to 800 units/hr APTT/HL with AM labs  Bertis Ruddy, PharmD Clinical Pharmacist Please check AMION for all [place] numbers 12/13/2018 4:57 PM

## 2018-12-13 NOTE — Progress Notes (Signed)
Internal Medicine Teaching Service Attending:   I saw and examined the patient. I reviewed the resident's note and I agree with the resident's findings and plan as documented in the resident's note.  Active Problems:   CVA (cerebral vascular accident) ([place])   Acute on chronic renal insufficiency   Hyperglycemia   Weak  84 year old person hospital day #11 for a right MCA infarct.  Doing well, now on anticoagulation with heparin while the warfarin becomes therapeutic.  We decided to forego DOAC as she cannot afford this medication consistently.  Currently waiting for placement in skilled nursing facility for subacute rehab prior to returning to home.  Lalla Brothers, MD FACP

## 2018-12-13 NOTE — Progress Notes (Signed)
ANTICOAGULATION CONSULT NOTE - Initial Consult  Pharmacy Consult for Apixaban >> Heparin bridge + Warf Indication: Afib, new CVA  Allergies  Allergen Reactions  . Oxycodone Nausea And Vomiting    Patient Measurements: Height: 5\' 3"  (160 cm) Weight: 195 lb 5.2 oz (88.6 kg) IBW/kg (Calculated) : 52.4 Heparin Dosing Weight: 72.4 kg  Vital Signs: Temp: 97.5 F (36.4 C) (07/26 0337) Temp Source: Oral (07/26 0337) BP: 129/50 (07/26 0337) Pulse Rate: 60 (07/26 0337)  Labs: Recent Labs    12/12/18 1345 12/13/18 0636  HGB 10.5*  --   HCT 32.5*  --   PLT 354  --   APTT 33 67*  LABPROT 15.3* 15.8*  INR 1.2 1.3*  HEPARINUNFRC >2.20*  --   CREATININE 1.49*  --     Estimated Creatinine Clearance: 31.3 mL/min (A) (by C-G formula based on SCr of 1.49 mg/dL (H)).   Medical History: Past Medical History:  Diagnosis Date  . Asthma   . CAD (coronary artery disease)    a. 2000 s/p PCI RCA;  b. 2005 PCI of RCA 2/2 ISR; c. 04/2013 Neg MV;  d. 05/2013 Cath: LM 20, LAD 30p, LCX 20p, OM1 90 small, RCA 62m (PTCA - FFR 0.86), PDA 40.  Marland Kitchen Cerebral infarction ([place])   . Chronic diastolic CHF (congestive heart failure) ([place])    a. 10/2013 Echo: EF 60-65%, no rwma, Gr1 DD, mild MR, mildly dil LA.  . CKD (chronic kidney disease), stage III ([place])   . DM (diabetes mellitus) ([place])   . Dyslipidemia   . GERD (gastroesophageal reflux disease)   . Hypertensive heart disease   . Hypothyroidism    hx  . Obesity   . Osteoarthritis   . PAF (paroxysmal atrial fibrillation) ([place])    a. s/p dccv 04/2014;  b. amio/eliquis;  c. 06/2013 bb/ccb d/c 2/2 symptomatic bradycardia; d. 04/2015 recurrent AF->amio increased/bb resumed.  . Rheumatoid arthritis(714.0)   . Right rib fracture    a. 04/2015.  Marland Kitchen Shoulder fracture, left    a. Dr. Mack Guise    Assessment: 15 YOF with history of Afib noncompliant with Apixaban PTA due to cost admitted on 7/14 with a new CVA. Pharmacy now consulted to start a Heparin  bridge with warfarin.   aPTT this morning is therapeutic (67, goal of 66-85).Heparin level remains falsely elevated with recent Apixaban use. INR today remains SUBtherapeutic but trending up (INR 1.3 << 1.2). Will continue with the lower dose with initiation due to age>80 and expected drug interaction with amiodarone. Will consider increasing the dose tomorrow based on INR trends.  Goal of Therapy:  INR 2-3 Heparin level 0.3-0.5 units/ml aPTT 66-85 seconds Monitor platelets by anticoagulation protocol: Yes   Plan:  - Continue Heparin at a rate of 850 units/hr (8.5 ml/hr) - Repeat Warfarin 3 mg x 1 dose at 1800 today - Will continue to monitor for any signs/symptoms of bleeding and will follow up with aPTTl in 8 hours to confirm therapeutic, INR in the AM  Thank you for allowing pharmacy to be a part of this patient's care.  Alycia Rossetti, PharmD, BCPS Clinical Pharmacist Clinical phone for 12/13/2018: [phone] 12/13/2018 7:46 AM   **Pharmacist phone directory can now be found on [email or web address] ([email or web address]).  Listed under [place].

## 2018-12-13 NOTE — Progress Notes (Signed)
   Subjective: Patient resting in chair drinking coffee.  We started her heparin to warfarin bridge yesterday, pharmacy is helping with dosing.  Plan to talk to her daughter and see how she can be taken to follow-up if needed when she is home.  She is ready for discharge and we reassured her our team is working on discharging her soon as insurance approves/bed is available.  She reports looking forward to getting back home to see her bird, named Precious.  Informed her that I made a phone call to her daughter yesterday but was unable to reach her.  I will try to reach her again today.  Objective:  Vital signs in last 24 hours: Vitals:   12/12/18 1924 12/13/18 0032 12/13/18 0337 12/13/18 0410  BP: (!) 142/57 (!) 124/49 (!) 129/50   Pulse: 63 72 60   Resp: 16 16 16    Temp: 98 F (36.7 C) 97.7 F (36.5 C) (!) 97.5 F (36.4 C)   TempSrc: Oral Oral Oral   SpO2:  98% 96%   Weight:    88.6 kg  Height:       Physical Exam Constitutional:      General: She is not in acute distress. Cardiovascular:     Rate and Rhythm: Normal rate. Rhythm irregular.  Abdominal:     General: Bowel sounds are normal.     Palpations: Abdomen is soft.  Neurological:     Mental Status: She is alert and oriented to person, place, and time. Mental status is at baseline.     Comments: 4/5 Strength bilateral in UE and LE     Assessment/Plan:  Active Problems:   CVA (cerebral vascular accident) ([place])   Acute on chronic renal insufficiency   Hyperglycemia   Weak  84 year old female with significant past medical history of A. Fib, DM2, CKD stage III, diastolic CHF, CAD, and left MCA stroke who presented for weakness after having a fall was found to have a right MCA infarct  TML:YYTKP MCA infarct in setting of A. fiboffEliquis 2/2cost barrier. A1c9.2,LipidsWNL. PT OT SLP continue to work with patient. Discharge pending SNF placement.  -dysphagia 3 diet -SLP/PT/OT -Aspirin 81 mg  Afib:Stopped  Eliquis.  Started heparin and warfarin bridge on 7/25, appreciate pharmacy dosing.  Patient on amiodarone. Not on rate control, normal rate. INR 1.3 subtherapeutic today.  PTT 67, therapeutic.  CBC pending, will monitor platelets. - Heparin bridge to warfarin - Amiodarone 200 mg  HTN:We will continue current regimen - Amlodipine 10mg Daily - Lisinopril 10mg  Daily  AKI:Cr.1.69> 1.42. Baseline ~0.9in 2017.Creatinine did notrespond furtherto fluids. GFR ~37  Diabetes:On Metformin and 18U Lantus at home. - Lantus 12Uqhs -SSI-M qhs  History of Falls:PT/OT, Fall Precautions Hypothyroidism:levothyroxine IV 117mcg  Diet -Dyspagia3 Diet VTEppx-Heparin bridge to Warfarin  CODE STATUS - FULL CODE.  Dispo:Discharge pending SNF placement  [name], MD PGY1  [phone]

## 2018-12-14 LAB — GLUCOSE, CAPILLARY
Glucose-Capillary: 109 mg/dL — ABNORMAL HIGH (ref 70–99)
Glucose-Capillary: 266 mg/dL — ABNORMAL HIGH (ref 70–99)
Glucose-Capillary: 198 mg/dL — ABNORMAL HIGH (ref 70–99)
Glucose-Capillary: 166 mg/dL — ABNORMAL HIGH (ref 70–99)

## 2018-12-14 LAB — PROTIME-INR
INR: 1.1 (ref 0.8–1.2)
Prothrombin Time: 14.5 seconds (ref 11.4–15.2)

## 2018-12-14 LAB — CBC
HCT: 32.2 % — ABNORMAL LOW (ref 36.0–46.0)
Hemoglobin: 10.1 g/dL — ABNORMAL LOW (ref 12.0–15.0)
MCH: 30.3 pg (ref 26.0–34.0)
MCHC: 31.4 g/dL (ref 30.0–36.0)
MCV: 96.7 fL (ref 80.0–100.0)
Platelets: 358 10*3/uL (ref 150–400)
RBC: 3.33 MIL/uL — ABNORMAL LOW (ref 3.87–5.11)
RDW: 13 % (ref 11.5–15.5)
WBC: 6.6 10*3/uL (ref 4.0–10.5)
nRBC: 0 % (ref 0.0–0.2)

## 2018-12-14 LAB — APTT: aPTT: 68 seconds — ABNORMAL HIGH (ref 24–36)

## 2018-12-14 LAB — HEPARIN LEVEL (UNFRACTIONATED): Heparin Unfractionated: 1.06 IU/mL — ABNORMAL HIGH (ref 0.30–0.70)

## 2018-12-14 MED ORDER — WARFARIN SODIUM 5 MG PO TABS
5.0000 mg | ORAL_TABLET | Freq: Once | ORAL | Status: AC
  Administered 2018-12-14: 5 mg via ORAL
  Filled 2018-12-14: qty 1

## 2018-12-14 NOTE — Progress Notes (Signed)
ANTICOAGULATION CONSULT NOTE - Initial Consult  Pharmacy Consult for Apixaban >> Heparin bridge + Warf Indication: Afib, new CVA   Patient Measurements: Height: 5\' 3"  (160 cm) Weight: 195 lb 5.2 oz (88.6 kg) IBW/kg (Calculated) : 52.4 Heparin Dosing Weight: 72.4 kg  Vital Signs: Temp: 97.5 F (36.4 C) (07/27 0741) Temp Source: Axillary (07/27 0741) BP: 152/73 (07/27 0741) Pulse Rate: 48 (07/27 0741)  Labs: Recent Labs    12/12/18 1345 12/13/18 0636 12/13/18 1609 12/14/18 0500 12/14/18 0557  HGB 10.5*  --  9.7*  --  10.1*  HCT 32.5*  --  30.0*  --  32.2*  PLT 354  --  352  --  358  APTT 33 67* 82*  --  68*  LABPROT 15.3* 15.8*  --   --  14.5  INR 1.2 1.3*  --   --  1.1  HEPARINUNFRC >2.20* 1.82*  --  1.06*  --   CREATININE 1.49*  --   --   --   --      Assessment: 84 year old with history of Afib noncompliant with Apixaban PTA due to cost admitted on 7/14 with a new CVA. Pharmacy now consulted to start a Heparin bridge with warfarin.   aPTT this morning is therapeutic (68, goal of 66-85). Heparin level remains falsely elevated with recent Apixaban use. INR today remains SUBtherapeutic at 1.1, as expected after only 2 doses of warfarin.     Goal of Therapy:  INR 2-3 Heparin level 0.3-0.5 units/ml aPTT 66-85 seconds Monitor platelets by anticoagulation protocol: Yes    Plan:  - Continue Heparin at a rate of 800 units/hr  - warfarin 5 mg po x1 - Will continue to monitor for any signs/symptoms of bleeding and will follow up with aPTT in 8 hours to confirm therapeutic - daily HL, CBC, INR   Harvel Quale  12/14/2018 8:37 AM

## 2018-12-14 NOTE — Progress Notes (Signed)
  Date: 12/14/2018  Patient name: [name]  Medical record number: [id number]  Date of birth: September 10, 1934   I have seen and evaluated this patient and I have discussed the plan of care with the house staff. Please see their note for complete details. I concur with their findings.  Lenice Pressman, M.D., Ph.D. 12/14/2018, 2:50 PM

## 2018-12-14 NOTE — Progress Notes (Signed)
  Speech Language Pathology Treatment: Dysphagia  Patient Details Name: [name] [name] MRN: [id number] DOB: 06-11-1934 Today's Date: 12/14/2018 Time: 4010-2725 SLP Time Calculation (min) (ACUTE ONLY): 12 min  Assessment / Plan / Recommendation Clinical Impression  Pt has immediate coughing today while drinking water via straw, but not with cup sips. She still has occasional, delayed throat clearing that appears to be more consistent with previously documented SLP notes. Min-Mod cues were provided for awareness of left-sided pocketing, which pt then cleared with a lingual sweep. She did not want to eat many bites, saying that it was too dry, which may also demonstrate some insight into her limitations. Recommend continuing current diet but avoiding straws. Swallow signs in the room were updated to reflect this.   HPI HPI: 84 yo female adm [facility] *transfer* with left sided weakness. Large area of acute right MCA territory infarct centered at the posterior insula and posterior frontal lobe. There is petechial hemorrhage superimposed. Large remote left MCA territory infarct affecting insula and lateral frontal lobe as well. Remote lacunar infarct in the left thalamus and right pons.  Pt did not pass [facility] swallow screen and thus SLP eval ordered.Pt also with h/o HTN, dyspnea, pna, GERD, UTI, bradycardia, renal fx.  Pt reports she lives alone in a trailer with a bird.Pt had a Cortrak which was removed yesterday and she started on dys1/nectar diet.  Intake at breakfast today was everything except the eggs.  No severe oral pocketing reported today.      SLP Plan  Continue with current plan of care       Recommendations  Diet recommendations: Dysphagia 3 (mechanical soft);Thin liquid Liquids provided via: Cup;No straw Medication Administration: Whole meds with puree Supervision: Patient able to self feed;Intermittent supervision to cue for compensatory strategies Compensations: Lingual sweep for  clearance of pocketing;Slow rate;Small sips/bites Postural Changes and/or Swallow Maneuvers: Seated upright 90 degrees;Upright 30-60 min after meal                Oral Care Recommendations: Oral care BID Follow up Recommendations: Skilled Nursing facility;24 hour supervision/assistance SLP Visit Diagnosis: Dysphagia, oropharyngeal phase (R13.12) Plan: Continue with current plan of care       GO                Venita Sheffield Roxana Lai 12/14/2018, 4:07 PM  Pollyann Glen, M.A. [place] Acute Environmental education officer [phone] Office [phone]

## 2018-12-14 NOTE — Progress Notes (Signed)
   Subjective: [name] [name] reports doing well this morning.  She is awaiting discharge to SNF. I spoke to her daughter yesterday who sees [name] Sewer NP at [facility]. This location is close to [name] and I recommended she follow up with them after the SNF. She currently does not have a PCP.   Objective:  Vital signs in last 24 hours: Vitals:   12/13/18 2034 12/13/18 2357 12/14/18 0433 12/14/18 0741  BP: (!) 127/53 (!) 145/82 (!) 135/53 (!) 152/73  Pulse:  (!) 114  (!) 48  Resp: 17 17 17 16   Temp: 98.2 F (36.8 C) 98.3 F (36.8 C) 98 F (36.7 C) (!) 97.5 F (36.4 C)  TempSrc: Oral Oral Oral Axillary  SpO2: 100% 99% 98% 99%  Weight:      Height:       Physical Exam Constitutional:      Appearance: Normal appearance.  Cardiovascular:     Rate and Rhythm: Normal rate. Rhythm irregular.  Pulmonary:     Effort: Pulmonary effort is normal.     Breath sounds: Normal breath sounds.  Skin:    General: Skin is warm and dry.  Neurological:     Mental Status: She is alert. Mental status is at baseline.      Assessment/Plan:  Active Problems:   CVA (cerebral vascular accident) ([place])   Acute on chronic renal insufficiency   Hyperglycemia   Weak  84 year old female with significant past medical history of A. Fib, DM2,CKD stage III,diastolic CHF,CAD, andleft MCA stroke who presented for weakness after having a fall was found to have a right MCA infarct  YFV:[facility] MCA infarct in setting of A. fiboffEliquis 2/2cost barrier. A1c9.2,LipidsWNL. PT OT SLP continue to work with patient. Discharge pending SNF placement. Heparin to Warfarin Bridge started 7/25.  -dysphagia 3diet -SLP/PT/OT -Aspirin 81 mg  Afib:Stopped Eliquis.  Started heparin and warfarin bridge on 7/25, appreciate pharmacy dosing.  Patient on amiodarone. Not on rate control, normal rate. INR 1.1 subtherapeutic today.  PTT 68, therapeutic.  Platelets count 354, 352 ,358 since starting  heparin.  - Heparin bridge to warfarin - Amiodarone 200 mg  HTN:We will continue current regimen - Amlodipine 10mg Daily - Lisinopril 10mg  Daily  AKI:Cr.1.69> 1.42. Baseline ~0.9in 2017.Creatinine did notrespond furtherto fluids. GFR ~37  Diabetes:On Metformin and 18U Lantus at home. - Lantus 12Uqhs -SSI-M qhs  History of Falls:PT/OT, Fall Precautions Hypothyroidism:levothyroxine IV 127mcg  Diet -Dyspagia3 Diet VTEppx-Heparin bridge to Warfarin CODE STATUS - FULL CODE.  Dispo:Discharge pending SNF placement  [name], MD PGY1  [phone]

## 2018-12-14 NOTE — Care Management Important Message (Signed)
Important Message  Patient Details  Name: [name] [name] MRN: [id number] Date of Birth: 07-04-1934   Medicare Important Message Given:  Yes     Orbie Pyo 12/14/2018, 2:48 PM

## 2018-12-14 NOTE — Progress Notes (Signed)
Physical Therapy Treatment Patient Details Name: [name] [name] MRN: [id number] DOB: Oct 27, 1934 Today's Date: 12/14/2018    History of Present Illness Pt is an 84 y/o female who presents with stroke-like symptoms. MRI revealed large, acute R MCA infarct with petechial hemorrhage, as well as remote L MCA infarct and remote lacunar infarcts in L thalamus and R pons. PMH significant for L shoulder fracture s/p surgery 2014, RA, PAF, OA, hypothyroidism, DM, CKD, CHF, cerebral infarction, CAD, R TKR.    PT Comments    Pt is progressing with her gait and mobility, but still needs min guard assist due to balance and safety awareness.  She fatigues quickly.  PT will continue to follow acutely for safe mobility progression.  Goals updated.   Follow Up Recommendations  SNF;Supervision/Assistance - 24 hour     Equipment Recommendations  None recommended by PT    Recommendations for Other Services   NA     Precautions / Restrictions Precautions Precautions: Fall Precaution Comments: L inattention, monitor O2 sats    Mobility  Bed Mobility               General bed mobility comments: Pt was OOB in the recliner chair.   Transfers Overall transfer level: Needs assistance Equipment used: Rolling walker (2 wheeled) Transfers: Sit to/from Stand Sit to Stand: Supervision         General transfer comment: supervision for safety  Ambulation/Gait Ambulation/Gait assistance: Min guard Gait Distance (Feet): 150 Feet Assistive device: Rolling walker (2 wheeled) Gait Pattern/deviations: Step-through pattern;Shuffle;Trunk flexed Gait velocity: decreased Gait velocity interpretation: 1.31 - 2.62 ft/sec, indicative of limited community ambulator General Gait Details: Pt with shuffling gait pattern, DOE 2/4, gait speed slowed with fatiuge.  Min guard assist for safety.           Balance Overall balance assessment: Needs assistance Sitting-balance support: Feet supported;No upper  extremity supported Sitting balance-Leahy Scale: Good     Standing balance support: Bilateral upper extremity supported Standing balance-Leahy Scale: Fair Standing balance comment: close supervision for static standing without UE support.                             Cognition Arousal/Alertness: Awake/alert Behavior During Therapy: WFL for tasks assessed/performed Overall Cognitive Status: Impaired/Different from baseline Area of Impairment: Awareness                   Current Attention Level: Sustained Memory: Decreased short-term memory Following Commands: Follows one step commands consistently;Follows multi-step commands inconsistently Safety/Judgement: Decreased awareness of deficits Awareness: Intellectual   General Comments: Pt referencing her grandson as her son stating she has a 75 y.o. son.        Exercises Other Exercises Other Exercises: deferred exercises as supper was waiting.         Pertinent Vitals/Pain Pain Assessment: No/denies pain Pain Score: 0-No pain           PT Goals (current goals can now be found in the care plan section) Acute Rehab PT Goals Patient Stated Goal: to go home PT Goal Formulation: With patient Time For Goal Achievement: 12/28/18 Potential to Achieve Goals: Good Progress towards PT goals: Progressing toward goals(goals updated)    Frequency    Min 4X/week      PT Plan Current plan remains appropriate       AM-PAC PT "6 Clicks" Mobility   Outcome Measure  Help needed turning from your back to  your side while in a flat bed without using bedrails?: A Little Help needed moving from lying on your back to sitting on the side of a flat bed without using bedrails?: A Little Help needed moving to and from a bed to a chair (including a wheelchair)?: A Little Help needed standing up from a chair using your arms (e.g., wheelchair or bedside chair)?: None Help needed to walk in hospital room?: A Little Help  needed climbing 3-5 steps with a railing? : A Little 6 Click Score: 19    End of Session Equipment Utilized During Treatment: Gait belt Activity Tolerance: Patient limited by fatigue Patient left: in chair;with call bell/phone within reach;with chair alarm set   PT Visit Diagnosis: Other symptoms and signs involving the nervous system (N86.767)     Time: 2094-7096 PT Time Calculation (min) (ACUTE ONLY): 12 min  Charges:  $Gait Training: 8-22 mins          Quintarius Ferns B. Dellas Guard, PT, DPT  Acute Rehabilitation [phone] pager [phone] office  @ [facility]: [phone]            12/14/2018, 5:31 PM

## 2018-12-15 LAB — GLUCOSE, CAPILLARY
Glucose-Capillary: 125 mg/dL — ABNORMAL HIGH (ref 70–99)
Glucose-Capillary: 91 mg/dL (ref 70–99)
Glucose-Capillary: 154 mg/dL — ABNORMAL HIGH (ref 70–99)
Glucose-Capillary: 193 mg/dL — ABNORMAL HIGH (ref 70–99)
Glucose-Capillary: 228 mg/dL — ABNORMAL HIGH (ref 70–99)

## 2018-12-15 LAB — CBC
HCT: 28.3 % — ABNORMAL LOW (ref 36.0–46.0)
Hemoglobin: 9.1 g/dL — ABNORMAL LOW (ref 12.0–15.0)
MCH: 30 pg (ref 26.0–34.0)
MCHC: 32.2 g/dL (ref 30.0–36.0)
MCV: 93.4 fL (ref 80.0–100.0)
Platelets: 326 10*3/uL (ref 150–400)
RBC: 3.03 MIL/uL — ABNORMAL LOW (ref 3.87–5.11)
RDW: 13.2 % (ref 11.5–15.5)
WBC: 7 10*3/uL (ref 4.0–10.5)
nRBC: 0 % (ref 0.0–0.2)

## 2018-12-15 LAB — PROTIME-INR
INR: 1.1 (ref 0.8–1.2)
Prothrombin Time: 14.1 seconds (ref 11.4–15.2)

## 2018-12-15 LAB — APTT
aPTT: 56 seconds — ABNORMAL HIGH (ref 24–36)
aPTT: 65 seconds — ABNORMAL HIGH (ref 24–36)
aPTT: 66 seconds — ABNORMAL HIGH (ref 24–36)

## 2018-12-15 LAB — HEPARIN LEVEL (UNFRACTIONATED): Heparin Unfractionated: 0.69 IU/mL (ref 0.30–0.70)

## 2018-12-15 MED ORDER — WARFARIN VIDEO
Freq: Once | Status: AC
  Administered 2018-12-15: 13:00:00

## 2018-12-15 MED ORDER — COUMADIN BOOK
Freq: Once | Status: AC
  Administered 2018-12-15: 08:00:00
  Filled 2018-12-15: qty 1

## 2018-12-15 MED ORDER — WARFARIN SODIUM 5 MG PO TABS
5.0000 mg | ORAL_TABLET | Freq: Once | ORAL | Status: AC
  Administered 2018-12-15: 5 mg via ORAL
  Filled 2018-12-15: qty 1

## 2018-12-15 NOTE — Progress Notes (Signed)
  Date: 12/15/2018  Patient name: [name] [name]  Medical record number: [id number]  Date of birth: [birth year]   I have seen and evaluated this patient and I have discussed the plan of care with the house staff. Please see their note for complete details. I concur with their findings.  Lenice Pressman, M.D., Ph.D. 12/15/2018, 5:31 PM

## 2018-12-15 NOTE — Progress Notes (Signed)
   Subjective: Patient lying in bed on rounds.  She is more emotional today and would like to go home. Reported to have lost IV access briefly last night and it was replaced. I reiterate we would like for her to go to a SNF for her safety and feel she could benefit from rehabilitation. We continue to await placement.  I have spoken to her daughter, but I have not spoken to her son.  She gives me her son's number and I will give him a call today to follow-up.    Objective:  Vital signs in last 24 hours: Vitals:   12/15/18 0454 12/15/18 0500 12/15/18 1024 12/15/18 1210  BP: (!) 156/78  (!) 140/56 (!) 156/71  Pulse: 69  (!) 46 69  Resp: 19  14 14   Temp: 98.3 F (36.8 C)  (!) 97.5 F (36.4 C) 97.7 F (36.5 C)  TempSrc: Oral  Oral Axillary  SpO2: 99%  98% 96%  Weight:  87.8 kg    Height:       Physical Exam Constitutional:      Appearance: Normal appearance. She is ill-appearing.  Cardiovascular:     Rate and Rhythm: Normal rate. Rhythm irregular.  Pulmonary:     Effort: Pulmonary effort is normal.     Breath sounds: Normal breath sounds.  Abdominal:     General: Bowel sounds are normal.     Palpations: Abdomen is soft.  Neurological:     Mental Status: She is alert.      Assessment/Plan:  Principal Problem:   CVA (cerebral vascular accident) ([place]) Active Problems:   Acute on chronic renal insufficiency   Hyperglycemia   Weak  84 year old female with significant past medical history of A. Fib, DM2,CKD stage III,diastolic CHF,CAD, andleft MCA stroke who presented for weakness after having a fall was found to have a right MCA infarct  DXA:JOINO MCA infarct in setting of A. fiboffEliquis 2/2cost barrier. A1c9.2,LipidsWNL. PT OT SLP continue to work with patient. Discharge pending SNF placement. Heparin to Warfarin Bridge started 7/25.  -dysphagia 3diet -SLP/PT/OT -Aspirin 81 mg  Afib:Stopped Eliquis. Started heparin and warfarin bridge on 7/25,  appreciate pharmacy dosing.Patient on amiodarone. Not on rate control, normal rate.INR 1.1 subtherapeutic today. PTT 65, therapeutic. Platelets count 354, 352 ,358 , 326 since starting heparin.  - Heparin bridge to warfarin -Amiodarone 200 mg  HTN:We will continue current regimen - Amlodipine 10mg Daily - Lisinopril 10mg  Daily  AKI:Cr.1.69> 1.42. Baseline ~0.9in 2017.Creatinine did notrespond furtherto fluids. GFR ~37  Diabetes:On Metformin and 18U Lantus at home. - Lantus 12Uqhs -SSI-M qhs  History of Falls:PT/OT, Fall Precautions Hypothyroidism:levothyroxine IV 174mcg  Diet -Dyspagia3 Diet VTEppx-Heparin bridge to Warfarin CODE STATUS - FULL CODE.  Dispo:Discharge pending SNF placement  [name], MD PGY1  [phone]

## 2018-12-15 NOTE — Progress Notes (Signed)
ANTICOAGULATION CONSULT NOTE - Initial Consult  Pharmacy Consult for Apixaban >> Heparin bridge + Warf Indication: Afib, new CVA  Patient Measurements: Height: 5\' 3"  (160 cm) Weight: 193 lb 9 oz (87.8 kg) IBW/kg (Calculated) : 52.4 Heparin Dosing Weight: 72.4 kg  Vital Signs: Temp: 97.5 F (36.4 C) (07/28 1024) Temp Source: Oral (07/28 1024) BP: 140/56 (07/28 1024) Pulse Rate: 46 (07/28 1024)  Labs: Recent Labs    12/12/18 1345 12/13/18 0636 12/13/18 1609 12/14/18 0500 12/14/18 0557 12/15/18 0456 12/15/18 1006  HGB 10.5*  --  9.7*  --  10.1* 9.1*  --   HCT 32.5*  --  30.0*  --  32.2* 28.3*  --   PLT 354  --  352  --  358 326  --   APTT 33 67* 82*  --  68* 56* 65*  LABPROT 15.3* 15.8*  --   --  14.5 14.1  --   INR 1.2 1.3*  --   --  1.1 1.1  --   HEPARINUNFRC >2.20* 1.82*  --  1.06*  --  0.69  --   CREATININE 1.49*  --   --   --   --   --   --    Assessment: 84 year old with history of Afib noncompliant with Apixaban PTA due to cost admitted on 7/14 with a new CVA. Pharmacy now consulted to start a Heparin bridge with warfarin. Apixaban was continued inpatient, last dose was 7/25 AM.   7/28 - IV line lost overnight and replaced, likely causing aPTT to be slightly below goal at 56. Heparin was continued at 800 units/hr. Repeat aPPT of 65 is slightly below goal, heparin level of 0.69 is falsely elevated by apixaban. INR 1.1. Hgb 9.1 down slightly, no S/Sx bleeding per RN.  Goal of Therapy:  INR 2-3 Heparin level 0.3-0.5 units/ml aPTT 66-85 seconds Monitor platelets by anticoagulation protocol: Yes   Plan:  Increase Heparin to 850 units/hr  Repeat Warfarin 5 mg x1 Confirmatory aPTT in 8hrs  Daily aPTT, HL, CBC, INR   Acey Lav, PharmD  PGY1 Acute Care Pharmacy Resident [phone] 12/15/2018

## 2018-12-15 NOTE — Progress Notes (Signed)
ANTICOAGULATION CONSULT NOTE - Initial Consult  Pharmacy Consult for Apixaban >> Heparin bridge + Warf Indication: Afib, new CVA  Patient Measurements: Height: 5\' 3"  (160 cm) Weight: 193 lb 9 oz (87.8 kg) IBW/kg (Calculated) : 52.4 Heparin Dosing Weight: 72.4 kg  Vital Signs: Temp: 97.9 F (36.6 C) (07/28 1700) Temp Source: Axillary (07/28 1700) BP: 138/126 (07/28 1700) Pulse Rate: 97 (07/28 1700)  Labs: Recent Labs    12/13/18 0636  12/13/18 1609 12/14/18 0500 12/14/18 0557 12/15/18 0456 12/15/18 1006 12/15/18 1859  HGB  --    < > 9.7*  --  10.1* 9.1*  --   --   HCT  --   --  30.0*  --  32.2* 28.3*  --   --   PLT  --   --  352  --  358 326  --   --   APTT 67*  --  82*  --  68* 56* 65* 66*  LABPROT 15.8*  --   --   --  14.5 14.1  --   --   INR 1.3*  --   --   --  1.1 1.1  --   --   HEPARINUNFRC 1.82*  --   --  1.06*  --  0.69  --   --    < > = values in this interval not displayed.   Assessment: 84 year old with history of Afib noncompliant with Apixaban PTA due to cost admitted on 7/14 with a new CVA. Pharmacy now consulted to start a Heparin bridge with warfarin. Apixaban was continued inpatient, last dose was 7/25 AM.   7/28 AM - IV line lost overnight and replaced, likely causing aPTT to be slightly below goal at 56. Heparin was continued at 800 units/hr. Repeat aPPT of 65 was slightly below goal (heparin level of 0.69 is falsely elevated by apixaban). INR 1.1. Hgb 9.1 down slightly, no S/Sx bleeding per RN.  7/28 PM - Heparin level drawn ~7.5 hrs after rate increase to 850 units/hr was 66 sec, which is within the goal range for this patient. Per RN, no issues with IV or signs/sx of bleeding  Goal of Therapy:  INR 2-3 Heparin level 0.3-0.5 units/ml aPTT 66-85 seconds Monitor platelets by anticoagulation protocol: Yes   Plan:  Continue heparin at 850 units/hr  Repeat Warfarin 5 mg x 1 this PM (completed) Daily aPTT, HL, CBC, INR  Monitor for signs/sx of  bleeding  Gillermina Hu, PharmD, BCPS, [facility] Clinical Pharmacist 12/15/2018

## 2018-12-15 NOTE — Discharge Instructions (Signed)

## 2018-12-15 NOTE — Progress Notes (Signed)
ANTICOAGULATION CONSULT NOTE - Initial Consult  Pharmacy Consult for Apixaban >> Heparin bridge + Warf Indication: Afib, new CVA   Patient Measurements: Height: 5\' 3"  (160 cm) Weight: 193 lb 9 oz (87.8 kg) IBW/kg (Calculated) : 52.4 Heparin Dosing Weight: 72.4 kg  Vital Signs: Temp: 98.3 F (36.8 C) (07/28 0454) Temp Source: Oral (07/28 0454) BP: 156/78 (07/28 0454) Pulse Rate: 69 (07/28 0454)  Labs: Recent Labs    12/12/18 1345 12/13/18 0636 12/13/18 1609 12/14/18 0500 12/14/18 0557 12/15/18 0456  HGB 10.5*  --  9.7*  --  10.1* 9.1*  HCT 32.5*  --  30.0*  --  32.2* 28.3*  PLT 354  --  352  --  358 326  APTT 33 67* 82*  --  68* 56*  LABPROT 15.3* 15.8*  --   --  14.5 14.1  INR 1.2 1.3*  --   --  1.1 1.1  HEPARINUNFRC >2.20* 1.82*  --  1.06*  --  0.69  CREATININE 1.49*  --   --   --   --   --      Assessment: 84 year old with history of Afib noncompliant with Apixaban PTA due to cost admitted on 7/14 with a new CVA. Pharmacy now consulted to start a Heparin bridge with warfarin.   Repeat aPTT this morning below goal, heparin level falsely elevated by apixaban. Hgb down slightly, no S/Sx bleeding per RN. IV line lost overnight and replaced, likely causing aPTT to be slightly below goal.   Goal of Therapy:  INR 2-3 Heparin level 0.3-0.5 units/ml aPTT 66-85 seconds Monitor platelets by anticoagulation protocol: Yes    Plan:  -Continue heparin at 800 units/hr for now -Recheck aPTT later today   Arrie Senate, PharmD, BCPS Clinical Pharmacist Please check AMION for all [facility] numbers 12/15/2018

## 2018-12-15 NOTE — Progress Notes (Signed)
  Speech Language Pathology Treatment: Cognitive-Linquistic  Patient Details Name: [name] [name] MRN: [id number] DOB: Jun 06, 1934 Today's Date: 12/15/2018 Time:  -     Assessment / Plan / Recommendation Clinical Impression  Pt became frustrated and tearful during session therefore assisted her with functional goal for her utilizing cognitive strategies.  She stated "I just can't stay here".  She is worried regarding her Ware[employer].  SLP assisted pt with calling electric company.  She benefited from max verbal cues for SLP to look up phone number for her local electric company. Max verbal/visual cues to approve written number and initially max verbal/visual cues to dial number correctly.  Pt became frustrated and tearful during session therefore assisted her with functional goal for her utilizing cognitive strategies.  She was able to reach the electric company and taught back process of making calls.  She may require assist to make phone calls as on her 3rd attempt she missed 3 digits.  Pt appears to get frustrated and anxious during session but with cues she is able to focus for task. Using teach back and written instructions, pt demonstrated process to make call.  Her decr sustained attention impacts her function however.   SLP questions if some of this age related with exacerbation with current event.  She will benefit from follow up for cognitive linguistic abilities to maximize her function in hopes of returning home in the future.    HPI HPI: 84 yo female adm to [facility] *transfer* with left sided weakness. Large area of acute right MCA territory infarct centered at the posterior insula and posterior frontal lobe. There is petechial hemorrhage superimposed. Large remote left MCA territory infarct affecting insula and lateral frontal lobe as well. Remote lacunar infarct in the left thalamus and right pons.  Pt did not pass [facility] swallow screen and thus SLP eval ordered.Pt also with h/o HTN, dyspnea,  pna, GERD, UTI, bradycardia, renal fx.  Pt reports she lives alone in a trailer with a bird.Pt had a Cortrak which was removed yesterday and she started on dys1/nectar diet.  She was advanced to dys3/thin after latest MBS.  Pt being followed for dysphagia and cognitive linguistic skills.      SLP Plan  Continue with current plan of care       Recommendations                   Follow up Recommendations: Skilled Nursing facility;24 hour supervision/assistance SLP Visit Diagnosis: Cognitive communication deficit (L46.503) Plan: Continue with current plan of care       Lee, [facility] SLP Acute Rehab Services Pager [phone] Office [phone]  Macario Golds 12/15/2018, 10:33 AM

## 2018-12-15 NOTE — Progress Notes (Signed)
Nutrition Follow-up   RD working remotely.  DOCUMENTATION CODES:   Obesity unspecified  INTERVENTION:  Provide Glucerna Shake po TID, each supplement provides 220 kcal and 10 grams of protein.  Encourage adequate PO intake.   NUTRITION DIAGNOSIS:   Inadequate oral intake related to inability to eat as evidenced by NPO status, diet advanced; improving  GOAL:   Patient will meet greater than or equal to 90% of their needs; met  MONITOR:   PO intake, Supplement acceptance, Diet advancement, Weight trends, Labs, I & O's, Skin  REASON FOR ASSESSMENT:   Consult Enteral/tube feeding initiation and management  ASSESSMENT:   84 year old female with a significant PMH of a fib, diabetes, CKD stage III, diastolic CHF, CAD, and left MCA stroke who presented for weakness after having a fall and was found to have a probable R parietal infarct.  Pt is currently on a dysphagia 3 diet with thin liquids. Meal completion has been 80%. Pt currently has nutritional supplements ordered and has been consuming them. RD to continue with current orders to aid in caloric and protein needs. SNF placement pending.   Labs and medications reviewed.   Diet Order:   Diet Order            DIET DYS 3 Room service appropriate? Yes with Assist; Fluid consistency: Thin  Diet effective now              EDUCATION NEEDS:   Not appropriate for education at this time  Skin:  Skin Assessment: Reviewed RN Assessment  Last BM:  7/26  Height:   Ht Readings from Last 1 Encounters:  12/02/18 5' 3" (1.6 m)    Weight:   Wt Readings from Last 1 Encounters:  12/15/18 87.8 kg    Ideal Body Weight:  52.27 kg  BMI:  Body mass index is 34.29 kg/m.  Estimated Nutritional Needs:   Kcal:  1700-1900  Protein:  85-100 grams  Fluid:  >/= 1.7 L/day    Corrin Parker, MS, RD, LDN Pager # [phone] After hours/ weekend pager # [phone]

## 2018-12-15 NOTE — Progress Notes (Signed)
Physical Therapy Treatment Patient Details Name: [name] [name] MRN: [id number] DOB: [birth year] Today's Date: 12/15/2018    History of Present Illness Pt is an 84 y/o female who presents with stroke-like symptoms. MRI revealed large, acute R MCA infarct with petechial hemorrhage, as well as remote L MCA infarct and remote lacunar infarcts in L thalamus and R pons. PMH significant for L shoulder fracture s/p surgery 2014, RA, PAF, OA, hypothyroidism, DM, CKD, CHF, cerebral infarction, CAD, R TKR.    PT Comments    Patient seen for mobility progression. Continue to progress as tolerated with anticipated d/c to SNF for further skilled PT services.     Follow Up Recommendations  SNF;Supervision/Assistance - 24 hour     Equipment Recommendations  None recommended by PT    Recommendations for Other Services       Precautions / Restrictions Precautions Precautions: Fall Precaution Comments: L inattention, monitor O2 sats    Mobility  Bed Mobility Overal bed mobility: Needs Assistance Bed Mobility: Supine to Sit     Supine to sit: Min guard;HOB elevated     General bed mobility comments: use of rail; increased time and effort; min guard for safety  Transfers Overall transfer level: Needs assistance Equipment used: Rolling walker (2 wheeled) Transfers: Sit to/from Stand Sit to Stand: Mod assist;Min guard         General transfer comment: cues for safety and hand placement; min guard to stand from EOB and mod A to power up into standing from commode with use of grab bar  Ambulation/Gait Ambulation/Gait assistance: Min guard;Min assist Gait Distance (Feet): 150 Feet Assistive device: Rolling walker (2 wheeled) Gait Pattern/deviations: Step-through pattern;Shuffle;Trunk flexed;Drifts right/left Gait velocity: decreased   General Gait Details: pt with increased gait deviations noted with increased distance/fatigue; cues for taking standing rest break due to 2/4 DOE and  increased drifting L/R; assistance to guide RW around obstacle particularly in small space like room   Stairs             Wheelchair Mobility    Modified Rankin (Stroke Patients Only)       Balance Overall balance assessment: Needs assistance Sitting-balance support: Feet supported;No upper extremity supported Sitting balance-Leahy Scale: Good     Standing balance support: Bilateral upper extremity supported Standing balance-Leahy Scale: Fair                              Cognition Arousal/Alertness: Awake/alert Behavior During Therapy: WFL for tasks assessed/performed Overall Cognitive Status: No family/caregiver present to determine baseline cognitive functioning Area of Impairment: Awareness;Safety/judgement                     Memory: Decreased short-term memory   Safety/Judgement: Decreased awareness of deficits     General Comments: pt attempting to stand multiple times from commode before it was safe and despite cues from therapist to remain seated       Exercises      General Comments General comments (skin integrity, edema, etc.): pt incontinent of urine while ambulating to restroom; pt reports wearing Depends underwear at home      Pertinent Vitals/Pain Pain Assessment: No/denies pain    Home Living                      Prior Function            PT Goals (current goals can now be  found in the care plan section) Acute Rehab PT Goals Patient Stated Goal: to go home Progress towards PT goals: Progressing toward goals    Frequency    Min 4X/week      PT Plan Current plan remains appropriate    Co-evaluation              AM-PAC PT "6 Clicks" Mobility   Outcome Measure  Help needed turning from your back to your side while in a flat bed without using bedrails?: A Little Help needed moving from lying on your back to sitting on the side of a flat bed without using bedrails?: A Little Help needed  moving to and from a bed to a chair (including a wheelchair)?: A Little Help needed standing up from a chair using your arms (e.g., wheelchair or bedside chair)?: None Help needed to walk in hospital room?: A Little Help needed climbing 3-5 steps with a railing? : A Little 6 Click Score: 19    End of Session Equipment Utilized During Treatment: Gait belt Activity Tolerance: Patient limited by fatigue Patient left: in chair;with call bell/phone within reach;with chair alarm set   PT Visit Diagnosis: Other symptoms and signs involving the nervous system (V25.366)     Time: 4403-4742 PT Time Calculation (min) (ACUTE ONLY): 29 min  Charges:  $Gait Training: 8-22 mins $Therapeutic Activity: 8-22 mins                     Erline Levine, PTA Acute Rehabilitation Services Pager: [phone] Office: [phone]     Carolynne Edouard 12/15/2018, 3:18 PM

## 2018-12-16 LAB — CBC
HCT: 29.8 % — ABNORMAL LOW (ref 36.0–46.0)
Hemoglobin: 9.5 g/dL — ABNORMAL LOW (ref 12.0–15.0)
MCH: 30 pg (ref 26.0–34.0)
MCHC: 31.9 g/dL (ref 30.0–36.0)
MCV: 94 fL (ref 80.0–100.0)
Platelets: 339 10*3/uL (ref 150–400)
RBC: 3.17 MIL/uL — ABNORMAL LOW (ref 3.87–5.11)
RDW: 13.1 % (ref 11.5–15.5)
WBC: 6.8 10*3/uL (ref 4.0–10.5)
nRBC: 0 % (ref 0.0–0.2)

## 2018-12-16 LAB — GLUCOSE, CAPILLARY
Glucose-Capillary: 88 mg/dL (ref 70–99)
Glucose-Capillary: 314 mg/dL — ABNORMAL HIGH (ref 70–99)
Glucose-Capillary: 200 mg/dL — ABNORMAL HIGH (ref 70–99)

## 2018-12-16 LAB — PROTIME-INR
INR: 1.1 (ref 0.8–1.2)
Prothrombin Time: 13.7 seconds (ref 11.4–15.2)

## 2018-12-16 LAB — APTT: aPTT: 60 seconds — ABNORMAL HIGH (ref 24–36)

## 2018-12-16 LAB — SARS CORONAVIRUS 2 BY RT PCR (HOSPITAL ORDER, PERFORMED IN ~~LOC~~ HOSPITAL LAB): SARS Coronavirus 2: NEGATIVE

## 2018-12-16 LAB — HEPARIN LEVEL (UNFRACTIONATED): Heparin Unfractionated: 0.43 IU/mL (ref 0.30–0.70)

## 2018-12-16 MED ORDER — WARFARIN SODIUM 5 MG PO TABS: 5.0000 mg | ORAL_TABLET | Freq: Once | ORAL | Status: DC

## 2018-12-16 MED ORDER — ENOXAPARIN SODIUM 80 MG/0.8ML ~~LOC~~ SOLN: 80.0000 mg | SUBCUTANEOUS | 0 refills | Status: DC

## 2018-12-16 MED ORDER — RESOURCE THICKENUP CLEAR PO POWD: 1.0000 | ORAL | 0 refills | Status: DC | PRN

## 2018-12-16 MED ORDER — INSULIN GLARGINE 100 UNIT/ML ~~LOC~~ SOLN: 12.0000 [IU] | Freq: Every day | SUBCUTANEOUS | 11 refills | Status: DC

## 2018-12-16 MED ORDER — SENNOSIDES-DOCUSATE SODIUM 8.6-50 MG PO TABS: 1.0000 | ORAL_TABLET | Freq: Every evening | ORAL | 0 refills | Status: DC | PRN

## 2018-12-16 MED ORDER — WARFARIN SODIUM 7.5 MG PO TABS
7.5000 mg | ORAL_TABLET | Freq: Once | ORAL | Status: AC
  Administered 2018-12-16: 7.5 mg via ORAL
  Filled 2018-12-16: qty 1

## 2018-12-16 MED ORDER — WARFARIN SODIUM 7.5 MG PO TABS: 7.5000 mg | ORAL_TABLET | Freq: Once | ORAL | 0 refills | Status: DC

## 2018-12-16 MED ORDER — ENOXAPARIN SODIUM 80 MG/0.8ML ~~LOC~~ SOLN
80.0000 mg | SUBCUTANEOUS | Status: DC
  Administered 2018-12-16: 80 mg via SUBCUTANEOUS
  Filled 2018-12-16: qty 0.8

## 2018-12-16 MED ORDER — FUROSEMIDE 40 MG PO TABS: 40.0000 mg | ORAL_TABLET | Freq: Every day | ORAL | 0 refills | Status: DC | PRN

## 2018-12-16 MED ORDER — NYSTATIN 100000 UNIT/GM EX POWD: Freq: Three times a day (TID) | CUTANEOUS | 0 refills | Status: DC

## 2018-12-16 MED ORDER — POTASSIUM CHLORIDE CRYS ER 20 MEQ PO TBCR: 20.0000 meq | EXTENDED_RELEASE_TABLET | Freq: Two times a day (BID) | ORAL | 0 refills | Status: DC | PRN

## 2018-12-16 NOTE — Progress Notes (Signed)
  Date: 12/16/2018  Patient name: [name] [name]  Medical record number: [id number]  Date of birth: Jun 30, 1934   I have seen and evaluated this patient and I have discussed the plan of care with the house staff. Please see their note for complete details. I concur with their findings.  Lenice Pressman, M.D., Ph.D. 12/16/2018, 6:01 PM

## 2018-12-16 NOTE — [place] FL2 (Signed)
[place] MEDICAID FL2 LEVEL OF CARE SCREENING TOOL     IDENTIFICATION  Patient Name: [name] [name] Birthdate: 1934/06/02 Sex: female Admission Date (Current Location): 12/01/2018  [facility] and [place] Number:  [employer] and Address:  [facility], [address], [place], [place] [address]      Provider Number: [id number]  Attending Physician Name and Address:  Anne Shutter, MD  Relative Name and Phone Number:       Current Level of Care: Hospital Recommended Level of Care: Skilled Nursing Facility Prior Approval Number:    Date Approved/Denied:   PASRR Number: [id number]  Discharge Plan: SNF    Current Diagnoses: Patient Active Problem List   Diagnosis Date Noted  . Weak   . Acute on chronic renal insufficiency   . Hyperglycemia   . CVA (cerebral vascular accident) (HCC) 12/01/2018  . Stage 2 Pressure ulcer, sacrum 11/25/2015  . 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 Foreign body of hip with infection   . Essential hypertension   . Pulmonary hypertension (HCC)   . Acute renal failure (HCC)   . Uncontrolled type 2 diabetes mellitus with complication (HCC)   . Postoperative wound infection of right hip   . MSSA infection, non-invasive   . Infection caused by Enterobacter cloacae   . Paroxysmal atrial fibrillation (HCC)   . Acute blood loss anemia   . Neurogenic bladder   . Sinus bradycardia 11/20/2015  . HCAP (healthcare-associated pneumonia)   . Acute renal failure (ARF) (HCC)   . Pressure ulcer 11/17/2015  . Sepsis (HCC) 11/16/2015  . AKI (acute kidney injury) (HCC)   . Metabolic acidosis   . Hypocalcemia   . Acute respiratory failure with hypoxia (HCC)   . Hypertensive heart disease   . Acute on chronic diastolic CHF (congestive heart failure) (HCC)   . Coronary artery disease involving native coronary artery of native heart without angina pectoris   . Persistent atrial fibrillation   .  Dyspnea 07/18/2015  . Follicular acne 07/07/2015  . Acute diastolic heart failure (HCC) 06/16/2015  . Routine history and physical examination of adult 01/13/2015  . Dermatitis 06/27/2014  . CKD (chronic kidney disease), stage III (HCC)   . Bradycardia 06/24/2014  . Diabetes mellitus type 2, uncontrolled (HCC) 03/25/2014  . Depression 03/25/2014  . Medicare annual wellness visit, subsequent 01/10/2014  . Anxiety state 01/10/2014  . Carotid stenosis 11/16/2013  . CVA (cerebral infarction) 10/21/2013  . Screening for breast cancer 01/14/2013  . Poorly controlled type 2 diabetes mellitus with circulatory disorder (HCC) 02/04/2012  . Chronic UTI 06/12/2011  . GERD (gastroesophageal reflux disease) 06/12/2011  . Hyperlipidemia 06/12/2011  . Hypertension 05/17/2011  . Hypothyroidism 03/07/2011  . Chronic back pain 02/21/2011  . Morbid obesity (HCC) 08/17/2008  . CAD (coronary artery disease) 08/17/2008  . Atrial fibrillation (HCC) 08/17/2008  . Osteoarthritis, knee 08/17/2008    Orientation RESPIRATION BLADDER Height & Weight     Self, Time, Situation, Place  Normal Incontinent Weight: 192 lb 0.3 oz (87.1 kg) Height:  5\' 3"  (160 cm)  BEHAVIORAL SYMPTOMS/MOOD NEUROLOGICAL BOWEL NUTRITION STATUS      Continent Diet(see DC summary)  AMBULATORY STATUS COMMUNICATION OF NEEDS Skin   Limited Assist Verbally Normal                       Personal Care Assistance Level of Assistance  Bathing, Feeding, Dressing Bathing Assistance: Limited  assistance Feeding assistance: Independent Dressing Assistance: Limited assistance     Functional Limitations Info  Sight, Hearing, Speech Sight Info: Adequate Hearing Info: Adequate Speech Info: Adequate    SPECIAL CARE FACTORS FREQUENCY  PT (By licensed PT), OT (By licensed OT), Speech therapy     PT Frequency: 5x/wk OT Frequency: 5x/wk     Speech Therapy Frequency: 5x/wk      Contractures Contractures Info: Not present     Additional Factors Info  Code Status, Allergies, Insulin Sliding Scale Code Status Info: Full Allergies Info: Oxycodone   Insulin Sliding Scale Info: 0-9 units 3x/day with meals; Lantus 12 units daily       Current Medications (12/16/2018):  This is the current hospital active medication list Current Facility-Administered Medications  Medication Dose Route Frequency Provider Last Rate Last Dose  . acetaminophen (TYLENOL) tablet 650 mg  650 mg Oral Q4H PRN Kathi Ludwig, MD       Or  . acetaminophen (TYLENOL) solution 650 mg  650 mg Per Tube Q4H PRN Kathi Ludwig, MD   650 mg at 12/05/18 2116   Or  . acetaminophen (TYLENOL) suppository 650 mg  650 mg Rectal Q4H PRN Kathi Ludwig, MD      . amiodarone (PACERONE) tablet 200 mg  200 mg Oral Daily Neva Seat, MD   200 mg at 12/16/18 1019  . amLODipine (NORVASC) tablet 10 mg  10 mg Oral Daily Neva Seat, MD   10 mg at 12/16/18 1019  . aspirin EC tablet 81 mg  81 mg Oral Daily Madalyn Rob, MD   81 mg at 12/16/18 1019  . feeding supplement (GLUCERNA SHAKE) (GLUCERNA SHAKE) liquid 237 mL  237 mL Oral TID BM Gilles Chiquito B, MD   237 mL at 12/16/18 1022  . heparin ADULT infusion 100 units/mL (25000 units/237mL sodium chloride 0.45%)  850 Units/hr Intravenous Continuous Gillian Scarce, RPH 8.5 mL/hr at 12/16/18 0029 850 Units/hr at 12/16/18 0029  . insulin aspart (novoLOG) injection 0-9 Units  0-9 Units Subcutaneous TID WC Madalyn Rob, MD   2 Units at 12/15/18 1850  . insulin glargine (LANTUS) injection 12 Units  12 Units Subcutaneous Daily Madalyn Rob, MD   12 Units at 12/16/18 1020  . levothyroxine (SYNTHROID) tablet 150 mcg  150 mcg Oral Q0600 Neva Seat, MD   150 mcg at 12/16/18 0609  . lisinopril (ZESTRIL) tablet 10 mg  10 mg Oral Daily Neva Seat, MD   10 mg at 12/16/18 1019  . loperamide HCl (IMODIUM) 1 MG/7.5ML suspension 2 mg  2 mg Oral PRN Neva Seat, MD   2 mg at 12/12/18 1052  .  nystatin (MYCOSTATIN/NYSTOP) topical powder   Topical TID Kathi Ludwig, MD      . Resource ThickenUp Clear   Oral PRN Sid Falcon, MD      . senna-docusate (Senokot-S) tablet 1 tablet  1 tablet Oral QHS PRN Kathi Ludwig, MD      . warfarin (COUMADIN) tablet 7.5 mg  7.5 mg Oral ONCE-1800 Gillian Scarce, [facility]      . Warfarin - Pharmacist Dosing Inpatient   Does not apply q1800 Rolla Flatten, [facility]         Discharge Medications: Please see discharge summary for a list of discharge medications.  Relevant Imaging Results:  Relevant Lab Results:   Additional Information SS#: [id number]  Geralynn Ochs, LCSW

## 2018-12-16 NOTE — Progress Notes (Signed)
  Speech Language Pathology Treatment: Cognitive-Linquistic  Patient Details Name: [name] [name] MRN: [id number] DOB: 1935-01-03 Today's Date: 12/16/2018 Time: 2094-7096 SLP Time Calculation (min) (ACUTE ONLY): 24 min  Assessment / Plan / Recommendation Clinical Impression  Pt was seen for treatment and was moderately cooperative throughout the session. She denied any baseline deficits in cognition and stated that her speech is currently at baseline. Clarification was frequently needed due to reduced speech intelligibility. She required moderate to maximum support during a medication management (prescription) task. She completed time problems with 20% accuracy when mod-max support was given. She demonstrated 60% accuracy with problem solving related to safety increasing to 100% with mod cues. Mod support was needed for task sequencing. Pt currently has discharge orders and will benefit from continued SLP services upon discharge.    HPI HPI: Pt is an 14 y,o. female adm to [facility] *transfer* with left sided weakness. Large area of acute right MCA territory infarct centered at the posterior insula and posterior frontal lobe. There is petechial hemorrhage superimposed. Large remote left MCA territory infarct affecting insula and lateral frontal lobe as well. Remote lacunar infarct in the left thalamus and right pons.  Pt did not pass [facility] swallow screen and thus SLP eval ordered.Pt also with h/o HTN, dyspnea, pna, GERD, UTI, bradycardia, renal fx.  Pt reports she lives alone in a trailer with a bird.Pt had a Cortrak which was removed yesterday and she started on dys1/nectar diet.  She was advanced to dys3/thin after latest MBS.  Pt being followed for dysphagia and cognitive linguistic skills.      SLP Plan  Continue with current plan of care       Recommendations                   Follow up Recommendations: Skilled Nursing facility;24 hour supervision/assistance SLP Visit Diagnosis: Cognitive  communication deficit (G83.662) Plan: Continue with current plan of care       Costantino Kohlbeck I. Hardin Negus, [place], [place] Office number [phone] Pager [place] 12/16/2018, 5:03 PM

## 2018-12-16 NOTE — Progress Notes (Signed)
ANTICOAGULATION CONSULT NOTE - Follow Up Consult  Pharmacy Consult for enoxaparin bridge Indication: atrial fibrillation  Allergies  Allergen Reactions  . Oxycodone Nausea And Vomiting    Patient Measurements: Height: 5\' 3"  (160 cm) Weight: 192 lb 0.3 oz (87.1 kg) IBW/kg (Calculated) : 52.4  Vital Signs: Temp: 97.4 F (36.3 C) (07/29 1148) Temp Source: Oral (07/29 1148) BP: 157/60 (07/29 1148) Pulse Rate: 50 (07/29 1148)  Labs: Recent Labs    12/14/18 0500 12/14/18 0557 12/15/18 0456 12/15/18 1006 12/15/18 1859 12/16/18 0331  HGB  --  10.1* 9.1*  --   --  9.5*  HCT  --  32.2* 28.3*  --   --  29.8*  PLT  --  358 326  --   --  339  APTT  --  68* 56* 65* 66* 60*  LABPROT  --  14.5 14.1  --   --  13.7  INR  --  1.1 1.1  --   --  1.1  HEPARINUNFRC 1.06*  --  0.69  --   --  0.43    Estimated Creatinine Clearance: 31 mL/min (A) (by C-G formula based on SCr of 1.49 mg/dL (H)).  Assessment: 84 year old with history of Afib noncompliant with Apixaban PTA due to cost admitted on 7/14 with a new CVA. Apixaban was continued inpatient, last dose was 7/25 AM. Patient was transitioned to heparin, now to lovenox bridge to warfarin. Enoxaparin dosed daily due to borderline CrCl and rounded to syringe size for ease of administration.  PLT WNL, H&H low stable; no bleeding reported.  Goal of Therapy:  INR 2-3 Anti-Xa level 0.6-1 units/ml 4hrs after LMWH dose given Monitor platelets by anticoagulation protocol: Yes   Plan:  Discontinue heparin Lovenox 80 mg  daily 1 hour after d/c heparin CBC every 3 days PRN BMET and increase lovenox to BID if CrCl improves   Maila Dukes L. Devin Going, PharmD, [place] PGY1 Pharmacy Resident [place]: [phone]  Mobile: [phone]  12/16/18 1:32 PM  Please check AMION for all [place] phone numbers After 10:00 PM, call the [place] [phone]

## 2018-12-16 NOTE — Progress Notes (Signed)
Pt left the floor at 2215 via [facility] for transport to [facility]. Vital signs stable, COVID swab negative.

## 2018-12-16 NOTE — TOC Transition Note (Signed)
Transition of Care [facility]) - CM/SW Discharge Note   Patient Details  Name: LORANA MAFFEO MRN: [id number] Date of Birth: 03/29/1935  Transition of [facility] CM/SW Contact:  Geralynn Ochs, LCSW Phone Number: 12/16/2018, 4:57 PM   Clinical Narrative:   Nurse to call report to [phone]. Nurse to call for transport after COVID Results.    Final next level of care: Skilled Nursing Facility Barriers to Discharge: Barriers Resolved   Patient Goals and CMS Choice Patient states their goals for this hospitalization and ongoing recovery are:: Pt is agreeable to rehab CMS Medicare.gov Compare Post Acute Care list provided to:: Patient Choice offered to / list presented to : Patient  Discharge Placement              Patient chooses bed at: [facility] Patient to be transferred to facility by: [facility] Name of family member notified: Self Patient and family notified of of transfer: 12/16/18  Discharge Plan and Services In-house Referral: Clinical Social Work Discharge Planning Services: NA Post Acute Care Choice: Home Health                               Social Determinants of Health (SDOH) Interventions     Readmission Risk Interventions Readmission Risk Prevention Plan 12/11/2018  Transportation Screening Complete  PCP or Specialist Appt within 3-5 Days Complete  HRI or Home Care Consult Complete  Social Work Consult for [place] Planning/Counseling Complete  Palliative Care Screening Complete  Medication Review [employer]) Complete  Some recent data might be hidden

## 2018-12-16 NOTE — Consult Note (Signed)
Student Pharmacist rounding with the IMTS/B2/[place] service was requested to provide guidance on transitioning the patient to enoxaparin (Lovenox) from heparin drip so that patient can be discharged to a [facility].  I have evaluated her baseline laboratory values as documented in her history and physical exam.  Patient should be commenced on enoxaparin (Lovenox) at the treatment dose of 80mg  (1 mg/kg) daily. This dose should be given 1-2 hours after the discontinuation of her heparin drip. Patient should continue her LMWH-Warfarin bridge until she has a stable INR between 2-3 for 24 hours. The facility she will be moved to should be contacted by our team to ensure they are up to date on the status of her bridge therapy and the frequency of monitoring. She will require daily INR monitoring until her INR is therapeutic as stated above. Dose adjustments may be required to reach this therapeutic level, for which the SNF will need to complete per their protocol while being mindful of concomitant medications of note, amiodarone. Typically, the co-administration of amiodarone and warfarin requires a 30-50% dose reduction of warfarin due to the enzyme inhibiting nature of amiodarone. Based on the patient's hypoprothrombinemic response to the warfarin-heparin bridge, it is likely she may show an atypical INR change upon dose adjustments. Once her INR is therapeutic and stable for 24 hours, enoxaparin can be discontinued. The patient will require additional monitoring every 3-4 days or as protocol requires to ensure patient remains within the therapeutic INR range.   [place]

## 2018-12-16 NOTE — Progress Notes (Signed)
Occupational Therapy Treatment Patient Details Name: [name] [name] MRN: [id number] DOB: 06-Jun-1934 Today's Date: 12/16/2018    History of present illness Pt is an 84 y/o female who presents with stroke-like symptoms. MRI revealed large, acute R MCA infarct with petechial hemorrhage, as well as remote L MCA infarct and remote lacunar infarcts in L thalamus and R pons. PMH significant for L shoulder fracture s/p surgery 2014, RA, PAF, OA, hypothyroidism, DM, CKD, CHF, cerebral infarction, CAD, R TKR.   OT comments  Pt stood at sink for grooming and hair washing/combing ~10 mins with intermittent assist, set-upA to modA overall. Pt requiring modA for combing hair today as BUE shoulders are sore and 2/5 MM grade for functional tasks.. Pt requiring cues for safety with transfers and mobility using RW.  Pt would greatly benefit from continued OT skilled services for ADL and energy conservation. OT following acutely.     Follow Up Recommendations  SNF    Equipment Recommendations  None recommended by OT    Recommendations for Other Services      Precautions / Restrictions Precautions Precautions: Fall Restrictions Weight Bearing Restrictions: No       Mobility Bed Mobility               General bed mobility comments: in recliner   Transfers Overall transfer level: Needs assistance Equipment used: Rolling walker (2 wheeled) Transfers: Sit to/from Stand Sit to Stand: Min guard         General transfer comment: cues for hand placement    Balance Overall balance assessment: Mild deficits observed, not formally tested                                         ADL either performed or assessed with clinical judgement   ADL Overall ADL's : Needs assistance/impaired Eating/Feeding: Set up;Sitting Eating/Feeding Details (indicate cue type and reason): entered room as pt was spitting out her drink onto her tray- when asked why she had disposed of her drink  like that, she said "I don't know, but i'll clean it up." Grooming: Min guard;Standing Grooming Details (indicate cue type and reason): brushing teeth with cues to add more toothpaste as she barely added any on.                  Toilet Transfer: Min guard;Ambulation;Regular Toilet;Grab bars           Functional mobility during ADLs: Min guard;Cueing for safety;Cueing for sequencing;Rolling walker General ADL Comments: Pt required cueing for safety and RW management     Vision   Vision Assessment?: No apparent visual deficits   Perception     Praxis      Cognition Arousal/Alertness: Awake/alert Behavior During Therapy: WFL for tasks assessed/performed Overall Cognitive Status: No family/caregiver present to determine baseline cognitive functioning Area of Impairment: Awareness;Safety/judgement                     Memory: Decreased short-term memory   Safety/Judgement: Decreased awareness of deficits              Exercises     Shoulder Instructions       General Comments pt attempting to use showercap to wash hair requiring set-upA on head and assist to keep the shower cap on while massaging her scalp. pt brushing hair to best of ability, but nots were too great.  OT required to perform rest of hair brushing to get knots out. Pt;s hair was braided to keep from getting matted again.    Pertinent Vitals/ Pain       Pain Assessment: No/denies pain  Home Living                                          Prior Functioning/Environment              Frequency  Min 2X/week        Progress Toward Goals  OT Goals(current goals can now be found in the care plan section)  Progress towards OT goals: Progressing toward goals  Acute Rehab OT Goals Patient Stated Goal: to go home OT Goal Formulation: With patient Time For Goal Achievement: 12/23/18 Potential to Achieve Goals: Good ADL Goals Pt Will Perform Grooming: with  supervision Pt Will Perform Upper Body Bathing: with supervision Pt Will Perform Lower Body Bathing: sit to/from stand;with min assist Pt Will Perform Upper Body Dressing: with supervision Pt Will Perform Lower Body Dressing: sit to/from stand;with min assist Pt Will Transfer to Toilet: with min assist Pt Will Perform Toileting - Clothing Manipulation and hygiene: with min assist  Plan Discharge plan remains appropriate    Co-evaluation                 AM-PAC OT "6 Clicks" Daily Activity     Outcome Measure   Help from another person eating meals?: None Help from another person taking care of personal grooming?: A Little Help from another person toileting, which includes using toliet, bedpan, or urinal?: A Little Help from another person bathing (including washing, rinsing, drying)?: A Little Help from another person to put on and taking off regular upper body clothing?: None Help from another person to put on and taking off regular lower body clothing?: A Little 6 Click Score: 20    End of Session Equipment Utilized During Treatment: Rolling walker;Gait belt  OT Visit Diagnosis: Unsteadiness on feet (R26.81);Repeated falls (R29.6);Muscle weakness (generalized) (M62.81);History of falling (Z91.81)   Activity Tolerance Patient tolerated treatment well   Patient Left in chair;with call bell/phone within reach;with chair alarm set   Nurse Communication Mobility status        Time: 8502-7741 OT Time Calculation (min): 32 min  Charges: OT General Charges $OT Visit: 1 Visit OT Treatments $Self Care/Home Management : 8-22 mins $Neuromuscular Re-education: 8-22 mins  Darryl Nestle) Marsa Aris OTR/L Acute Rehabilitation Services Pager: [phone] Office: [phone]    Audie Pinto 12/16/2018, 3:58 PM

## 2018-12-16 NOTE — Progress Notes (Signed)
[place] for Apixaban >> Heparin bridge + Warf Indication: Afib, new CVA  Patient Measurements: Height: 5\' 3"  (160 cm) Weight: 192 lb 0.3 oz (87.1 kg) IBW/kg (Calculated) : 52.4 Heparin Dosing Weight: 72.4 kg  Vital Signs: Temp: 98 F (36.7 C) (07/28 2334) Temp Source: Oral (07/28 2334) BP: 145/77 (07/28 2334) Pulse Rate: 46 (07/28 2334)  Labs: Recent Labs    12/14/18 0500 12/14/18 0557 12/15/18 0456 12/15/18 1006 12/15/18 1859 12/16/18 0331  HGB  --  10.1* 9.1*  --   --  9.5*  HCT  --  32.2* 28.3*  --   --  29.8*  PLT  --  358 326  --   --  339  APTT  --  68* 56* 65* 66* 60*  LABPROT  --  14.5 14.1  --   --  13.7  INR  --  1.1 1.1  --   --  1.1  HEPARINUNFRC 1.06*  --  0.69  --   --  0.43   Assessment: 84 year old with history of Afib noncompliant with Apixaban PTA due to cost admitted on 7/14 with a new CVA. Pharmacy now consulted to start a Heparin bridge with warfarin. Apixaban was continued inpatient, last dose was 7/25 AM.   Heparin level this morning 0.43 units/ml  INR 1.1 Hg 9.5, PTLC 339  Goal of Therapy:  INR 2-3 Heparin level 0.3-0.5 units/ml aPTT 66-85 seconds Monitor platelets by anticoagulation protocol: Yes   Plan:  Continue heparin at 850 units/hr  Coumadin 5 mg today  Thanks for allowing pharmacy to be a part of this patient's care.  Excell Seltzer, PharmD Clinical Pharmacist   12/16/2018

## 2018-12-16 NOTE — Progress Notes (Signed)
[place] for Heparin bridge + Warf Indication: Afib, new CVA  Patient Measurements: Height: 5\' 3"  (160 cm) Weight: 192 lb 0.3 oz (87.1 kg) IBW/kg (Calculated) : 52.4 Heparin Dosing Weight: 72.4 kg  Vital Signs: Temp: 97.8 F (36.6 C) (07/29 0805) Temp Source: Oral (07/29 0805) BP: 133/41 (07/29 0805) Pulse Rate: 54 (07/29 0805)  Labs: Recent Labs    12/14/18 0500 12/14/18 0557 12/15/18 0456 12/15/18 1006 12/15/18 1859 12/16/18 0331  HGB  --  10.1* 9.1*  --   --  9.5*  HCT  --  32.2* 28.3*  --   --  29.8*  PLT  --  358 326  --   --  339  APTT  --  68* 56* 65* 66* 60*  LABPROT  --  14.5 14.1  --   --  13.7  INR  --  1.1 1.1  --   --  1.1  HEPARINUNFRC 1.06*  --  0.69  --   --  0.43   Assessment: 84 year old with history of Afib noncompliant with Apixaban PTA due to cost admitted on 7/14 with a new CVA. Pharmacy now consulted for Heparin bridge with warfarin. Apixaban was continued inpatient, last dose was 7/25 AM. Currently on Heparin gtt 850 units/mL, warfarin 5 mg.  HL 0.43 units/ml (Therapeutic). Hg 9.5, PTLC 339; Stable. INR 1.1 unchanged x3 days; warranting warfarin to be increased to Warfarin 7.5 mg.   Goal of Therapy:  INR 2 - 3 Heparin level 0.3 - 0.5 units/mL Monitor platelets by anticoagulation protocol: Yes   Plan:  Continue heparin at 850 units/hr  Increase Coumadin to 7.5 mg today Daily HL, INR, CBC Monitor for bleeding   Thanks for allowing pharmacy to be a part of this patient's care.  Acey Lav, PharmD  PGY1 Acute Care Pharmacy Resident [phone]  12/16/2018

## 2018-12-16 NOTE — Progress Notes (Signed)
   Subjective: Patient is sitting in chair on exam.  She continues to express that she would like to be home as soon as possible.  Team reassures her this is our wish and believe SNF is a good stepping stone for her.  We are working with social work and she may be able to transfer to the SNF today.  Objective:  Vital signs in last 24 hours: Vitals:   12/16/18 0425 12/16/18 0510 12/16/18 0805 12/16/18 1148  BP:  (!) 112/51 (!) 133/41 (!) 157/60  Pulse:  68 (!) 54 (!) 50  Resp:  18 20 16   Temp:  97.9 F (36.6 C) 97.8 F (36.6 C) (!) 97.4 F (36.3 C)  TempSrc:  Oral Oral Oral  SpO2:  100% 99% 100%  Weight: 87.1 kg     Height:       Physical Exam Constitutional:      Appearance: Normal appearance.  Cardiovascular:     Rate and Rhythm: Normal rate and regular rhythm.  Pulmonary:     Effort: Pulmonary effort is normal.     Breath sounds: Normal breath sounds.  Abdominal:     General: Bowel sounds are normal.     Palpations: Abdomen is soft.  Neurological:     Mental Status: She is alert.     Assessment/Plan:  Principal Problem:   CVA (cerebral vascular accident) ([place]) Active Problems:   Acute on chronic renal insufficiency   Hyperglycemia   Weak  84 year old female with significant past medical history of A. Fib, DM2,CKD stage III,diastolic CHF,CAD, andleft MCA stroke who presented for weakness after having a fall was found to have a right MCA infarct.  ZOX:WRUEA MCA infarct in setting of A. fiboffEliquis 2/2cost barrier. A1c9.2,LipidsWNL. PT OT SLP continue to work with patient. Discharge pending SNF placement. Heparin to Warfarin Bridge started 7/25. -dysphagia 3diet -SLP/PT/OT -Aspirin 81 mg  Afib:Stopped Eliquis. Started heparin and warfarin bridge on 7/25, appreciate pharmacy dosing.Patient on amiodarone. Not on rate control, normal rate.INR 1.1subtherapeutic today. PTT 60, therapeutic.Platelets stable since starting heparin.SW has found  placement. Plans to insure patient can continue bridge at SNF. If so, will give treatment Lovenox dose and bridge to Warfarin. Stop Heparin, - Heparin bridge to warfarin -Amiodarone 200 mg  HTN:We will continue current regimen - Amlodipine 10mg Daily - Lisinopril 10mg  Daily  AKI:Cr.1.69> 1.42. Baseline ~0.9in 2017.Creatinine did notrespond furtherto fluids. GFR ~37  Diabetes:On Metformin and 18U Lantus at home. - Lantus 12Uqhs -SSI-M qhs  History of Falls:PT/OT, Fall Precautions Hypothyroidism:levothyroxine IV 174mcg  Diet -Dyspagia3 Diet VTEppx-Heparin bridge to Warfarin CODE STATUS - FULL CODE.  Dispo:Discharge to SNF today  [name], MD PGY1  [phone]

## 2019-03-06 DIAGNOSIS — F0390 Unspecified dementia without behavioral disturbance: Secondary | ICD-10-CM

## 2019-03-06 DIAGNOSIS — B373 Candidiasis of vulva and vagina: Secondary | ICD-10-CM

## 2019-03-06 DIAGNOSIS — I482 Chronic atrial fibrillation, unspecified: Secondary | ICD-10-CM | POA: Diagnosis not present

## 2019-03-06 DIAGNOSIS — E1110 Type 2 diabetes mellitus with ketoacidosis without coma: Secondary | ICD-10-CM | POA: Diagnosis not present

## 2019-03-06 DIAGNOSIS — N390 Urinary tract infection, site not specified: Secondary | ICD-10-CM | POA: Diagnosis not present

## 2019-03-06 DIAGNOSIS — E1122 Type 2 diabetes mellitus with diabetic chronic kidney disease: Secondary | ICD-10-CM | POA: Diagnosis not present

## 2019-03-07 DIAGNOSIS — N390 Urinary tract infection, site not specified: Secondary | ICD-10-CM | POA: Diagnosis not present

## 2019-03-07 DIAGNOSIS — I482 Chronic atrial fibrillation, unspecified: Secondary | ICD-10-CM | POA: Diagnosis not present

## 2019-03-07 DIAGNOSIS — E1110 Type 2 diabetes mellitus with ketoacidosis without coma: Secondary | ICD-10-CM | POA: Diagnosis not present

## 2019-03-07 DIAGNOSIS — E1122 Type 2 diabetes mellitus with diabetic chronic kidney disease: Secondary | ICD-10-CM | POA: Diagnosis not present

## 2019-03-08 DIAGNOSIS — E1110 Type 2 diabetes mellitus with ketoacidosis without coma: Secondary | ICD-10-CM | POA: Diagnosis not present

## 2019-03-08 DIAGNOSIS — E1122 Type 2 diabetes mellitus with diabetic chronic kidney disease: Secondary | ICD-10-CM | POA: Diagnosis not present

## 2019-03-08 DIAGNOSIS — I482 Chronic atrial fibrillation, unspecified: Secondary | ICD-10-CM | POA: Diagnosis not present

## 2019-03-08 DIAGNOSIS — N390 Urinary tract infection, site not specified: Secondary | ICD-10-CM | POA: Diagnosis not present

## 2019-03-09 DIAGNOSIS — E1110 Type 2 diabetes mellitus with ketoacidosis without coma: Secondary | ICD-10-CM | POA: Diagnosis not present

## 2019-03-09 DIAGNOSIS — I482 Chronic atrial fibrillation, unspecified: Secondary | ICD-10-CM | POA: Diagnosis not present

## 2019-03-09 DIAGNOSIS — N390 Urinary tract infection, site not specified: Secondary | ICD-10-CM | POA: Diagnosis not present

## 2019-03-09 DIAGNOSIS — E1122 Type 2 diabetes mellitus with diabetic chronic kidney disease: Secondary | ICD-10-CM | POA: Diagnosis not present

## 2019-03-10 DIAGNOSIS — I482 Chronic atrial fibrillation, unspecified: Secondary | ICD-10-CM | POA: Diagnosis not present

## 2019-03-10 DIAGNOSIS — E1110 Type 2 diabetes mellitus with ketoacidosis without coma: Secondary | ICD-10-CM | POA: Diagnosis not present

## 2019-03-10 DIAGNOSIS — E1122 Type 2 diabetes mellitus with diabetic chronic kidney disease: Secondary | ICD-10-CM | POA: Diagnosis not present

## 2019-03-10 DIAGNOSIS — N390 Urinary tract infection, site not specified: Secondary | ICD-10-CM | POA: Diagnosis not present

## 2019-03-11 DIAGNOSIS — N390 Urinary tract infection, site not specified: Secondary | ICD-10-CM | POA: Diagnosis not present

## 2019-03-11 DIAGNOSIS — I482 Chronic atrial fibrillation, unspecified: Secondary | ICD-10-CM | POA: Diagnosis not present

## 2019-03-11 DIAGNOSIS — E1110 Type 2 diabetes mellitus with ketoacidosis without coma: Secondary | ICD-10-CM | POA: Diagnosis not present

## 2019-03-11 DIAGNOSIS — E1122 Type 2 diabetes mellitus with diabetic chronic kidney disease: Secondary | ICD-10-CM | POA: Diagnosis not present

## 2019-06-01 ENCOUNTER — Emergency Department (HOSPITAL_COMMUNITY): Payer: Medicare Other

## 2019-06-01 ENCOUNTER — Inpatient Hospital Stay (HOSPITAL_COMMUNITY)
Admission: EM | Admit: 2019-06-01 | Discharge: 2019-06-04 | DRG: 064 | Disposition: A | Discharge status: Skilled Nursing Facility | Payer: Medicare Other | Attending: Internal Medicine | Admitting: Internal Medicine

## 2019-06-01 ENCOUNTER — Inpatient Hospital Stay (HOSPITAL_COMMUNITY): Payer: Medicare Other

## 2019-06-01 ENCOUNTER — Other Ambulatory Visit (HOSPITAL_COMMUNITY): Payer: Medicare Other

## 2019-06-01 ENCOUNTER — Encounter (HOSPITAL_COMMUNITY): Payer: Self-pay | Admitting: Emergency Medicine

## 2019-06-01 ENCOUNTER — Other Ambulatory Visit: Payer: Self-pay

## 2019-06-01 DIAGNOSIS — R29810 Facial weakness: Secondary | ICD-10-CM | POA: Diagnosis present

## 2019-06-01 DIAGNOSIS — E039 Hypothyroidism, unspecified: Secondary | ICD-10-CM | POA: Diagnosis present

## 2019-06-01 DIAGNOSIS — I480 Paroxysmal atrial fibrillation: Secondary | ICD-10-CM | POA: Diagnosis present

## 2019-06-01 DIAGNOSIS — R531 Weakness: Secondary | ICD-10-CM

## 2019-06-01 DIAGNOSIS — E785 Hyperlipidemia, unspecified: Secondary | ICD-10-CM | POA: Diagnosis present

## 2019-06-01 DIAGNOSIS — I158 Other secondary hypertension: Secondary | ICD-10-CM | POA: Diagnosis not present

## 2019-06-01 DIAGNOSIS — Z7189 Other specified counseling: Secondary | ICD-10-CM

## 2019-06-01 DIAGNOSIS — I130 Hypertensive heart and chronic kidney disease with heart failure and stage 1 through stage 4 chronic kidney disease, or unspecified chronic kidney disease: Secondary | ICD-10-CM | POA: Diagnosis present

## 2019-06-01 DIAGNOSIS — Z79890 Hormone replacement therapy (postmenopausal): Secondary | ICD-10-CM

## 2019-06-01 DIAGNOSIS — Z955 Presence of coronary angioplasty implant and graft: Secondary | ICD-10-CM

## 2019-06-01 DIAGNOSIS — R4781 Slurred speech: Secondary | ICD-10-CM | POA: Diagnosis not present

## 2019-06-01 DIAGNOSIS — I69398 Other sequelae of cerebral infarction: Secondary | ICD-10-CM

## 2019-06-01 DIAGNOSIS — R569 Unspecified convulsions: Secondary | ICD-10-CM | POA: Diagnosis not present

## 2019-06-01 DIAGNOSIS — N390 Urinary tract infection, site not specified: Secondary | ICD-10-CM | POA: Diagnosis present

## 2019-06-01 DIAGNOSIS — G8194 Hemiplegia, unspecified affecting left nondominant side: Secondary | ICD-10-CM | POA: Diagnosis present

## 2019-06-01 DIAGNOSIS — I340 Nonrheumatic mitral (valve) insufficiency: Secondary | ICD-10-CM | POA: Diagnosis not present

## 2019-06-01 DIAGNOSIS — N1832 Chronic kidney disease, stage 3b: Secondary | ICD-10-CM | POA: Diagnosis present

## 2019-06-01 DIAGNOSIS — I10 Essential (primary) hypertension: Secondary | ICD-10-CM | POA: Diagnosis present

## 2019-06-01 DIAGNOSIS — E1159 Type 2 diabetes mellitus with other circulatory complications: Secondary | ICD-10-CM | POA: Diagnosis present

## 2019-06-01 DIAGNOSIS — R29712 NIHSS score 12: Secondary | ICD-10-CM | POA: Diagnosis present

## 2019-06-01 DIAGNOSIS — M069 Rheumatoid arthritis, unspecified: Secondary | ICD-10-CM | POA: Diagnosis present

## 2019-06-01 DIAGNOSIS — Z515 Encounter for palliative care: Secondary | ICD-10-CM

## 2019-06-01 DIAGNOSIS — R339 Retention of urine, unspecified: Secondary | ICD-10-CM

## 2019-06-01 DIAGNOSIS — I361 Nonrheumatic tricuspid (valve) insufficiency: Secondary | ICD-10-CM | POA: Diagnosis not present

## 2019-06-01 DIAGNOSIS — Z452 Encounter for adjustment and management of vascular access device: Secondary | ICD-10-CM

## 2019-06-01 DIAGNOSIS — R253 Fasciculation: Secondary | ICD-10-CM | POA: Diagnosis present

## 2019-06-01 DIAGNOSIS — Z6832 Body mass index (BMI) 32.0-32.9, adult: Secondary | ICD-10-CM

## 2019-06-01 DIAGNOSIS — E1165 Type 2 diabetes mellitus with hyperglycemia: Secondary | ICD-10-CM | POA: Diagnosis not present

## 2019-06-01 DIAGNOSIS — I25119 Atherosclerotic heart disease of native coronary artery with unspecified angina pectoris: Secondary | ICD-10-CM | POA: Diagnosis not present

## 2019-06-01 DIAGNOSIS — E1122 Type 2 diabetes mellitus with diabetic chronic kidney disease: Secondary | ICD-10-CM | POA: Diagnosis present

## 2019-06-01 DIAGNOSIS — K219 Gastro-esophageal reflux disease without esophagitis: Secondary | ICD-10-CM

## 2019-06-01 DIAGNOSIS — Z66 Do not resuscitate: Secondary | ICD-10-CM | POA: Diagnosis not present

## 2019-06-01 DIAGNOSIS — R64 Cachexia: Secondary | ICD-10-CM | POA: Diagnosis present

## 2019-06-01 DIAGNOSIS — Z7982 Long term (current) use of aspirin: Secondary | ICD-10-CM

## 2019-06-01 DIAGNOSIS — Z885 Allergy status to narcotic agent status: Secondary | ICD-10-CM

## 2019-06-01 DIAGNOSIS — N179 Acute kidney failure, unspecified: Secondary | ICD-10-CM | POA: Diagnosis present

## 2019-06-01 DIAGNOSIS — R4182 Altered mental status, unspecified: Secondary | ICD-10-CM

## 2019-06-01 DIAGNOSIS — Z20822 Contact with and (suspected) exposure to covid-19: Secondary | ICD-10-CM | POA: Diagnosis present

## 2019-06-01 DIAGNOSIS — I4891 Unspecified atrial fibrillation: Secondary | ICD-10-CM | POA: Diagnosis present

## 2019-06-01 DIAGNOSIS — I4819 Other persistent atrial fibrillation: Secondary | ICD-10-CM

## 2019-06-01 DIAGNOSIS — E11649 Type 2 diabetes mellitus with hypoglycemia without coma: Secondary | ICD-10-CM | POA: Diagnosis present

## 2019-06-01 DIAGNOSIS — Z833 Family history of diabetes mellitus: Secondary | ICD-10-CM

## 2019-06-01 DIAGNOSIS — I639 Cerebral infarction, unspecified: Secondary | ICD-10-CM | POA: Diagnosis not present

## 2019-06-01 DIAGNOSIS — R4701 Aphasia: Secondary | ICD-10-CM | POA: Diagnosis present

## 2019-06-01 DIAGNOSIS — G514 Facial myokymia: Secondary | ICD-10-CM | POA: Diagnosis present

## 2019-06-01 DIAGNOSIS — I5032 Chronic diastolic (congestive) heart failure: Secondary | ICD-10-CM | POA: Diagnosis present

## 2019-06-01 DIAGNOSIS — G9341 Metabolic encephalopathy: Secondary | ICD-10-CM | POA: Diagnosis present

## 2019-06-01 DIAGNOSIS — Z794 Long term (current) use of insulin: Secondary | ICD-10-CM

## 2019-06-01 DIAGNOSIS — I4811 Longstanding persistent atrial fibrillation: Secondary | ICD-10-CM | POA: Diagnosis not present

## 2019-06-01 DIAGNOSIS — I2510 Atherosclerotic heart disease of native coronary artery without angina pectoris: Secondary | ICD-10-CM | POA: Diagnosis present

## 2019-06-01 DIAGNOSIS — J45909 Unspecified asthma, uncomplicated: Secondary | ICD-10-CM | POA: Diagnosis present

## 2019-06-01 DIAGNOSIS — Z79899 Other long term (current) drug therapy: Secondary | ICD-10-CM

## 2019-06-01 LAB — CBC WITH DIFFERENTIAL/PLATELET
Abs Immature Granulocytes: 0.07 10*3/uL (ref 0.00–0.07)
Basophils Absolute: 0 10*3/uL (ref 0.0–0.1)
Basophils Relative: 0 %
Eosinophils Absolute: 0 10*3/uL (ref 0.0–0.5)
Eosinophils Relative: 0 %
HCT: 43.3 % (ref 36.0–46.0)
Hemoglobin: 13.7 g/dL (ref 12.0–15.0)
Immature Granulocytes: 1 %
Lymphocytes Relative: 11 %
Lymphs Abs: 1.4 10*3/uL (ref 0.7–4.0)
MCH: 30 pg (ref 26.0–34.0)
MCHC: 31.6 g/dL (ref 30.0–36.0)
MCV: 95 fL (ref 80.0–100.0)
Monocytes Absolute: 0.8 10*3/uL (ref 0.1–1.0)
Monocytes Relative: 7 %
Neutro Abs: 10 10*3/uL — ABNORMAL HIGH (ref 1.7–7.7)
Neutrophils Relative %: 81 %
Platelets: 338 10*3/uL (ref 150–400)
RBC: 4.56 MIL/uL (ref 3.87–5.11)
RDW: 13.2 % (ref 11.5–15.5)
WBC: 12.3 10*3/uL — ABNORMAL HIGH (ref 4.0–10.5)
nRBC: 0 % (ref 0.0–0.2)

## 2019-06-01 LAB — URINALYSIS, ROUTINE W REFLEX MICROSCOPIC
Bilirubin Urine: NEGATIVE
Glucose, UA: NEGATIVE mg/dL
Ketones, ur: NEGATIVE mg/dL
Nitrite: POSITIVE — AB
Protein, ur: 100 mg/dL — AB
Specific Gravity, Urine: 1.013 (ref 1.005–1.030)
pH: 5 (ref 5.0–8.0)

## 2019-06-01 LAB — COMPREHENSIVE METABOLIC PANEL
ALT: 17 U/L (ref 0–44)
AST: 25 U/L (ref 15–41)
Albumin: 3.5 g/dL (ref 3.5–5.0)
Alkaline Phosphatase: 101 U/L (ref 38–126)
Anion gap: 13 (ref 5–15)
BUN: 44 mg/dL — ABNORMAL HIGH (ref 8–23)
CO2: 26 mmol/L (ref 22–32)
Calcium: 9 mg/dL (ref 8.9–10.3)
Chloride: 101 mmol/L (ref 98–111)
Creatinine, Ser: 2.29 mg/dL — ABNORMAL HIGH (ref 0.44–1.00)
GFR calc Af Amer: 22 mL/min — ABNORMAL LOW (ref >60)
GFR calc non Af Amer: 19 mL/min — ABNORMAL LOW (ref >60)
Glucose, Bld: 122 mg/dL — ABNORMAL HIGH (ref 70–99)
Potassium: 4.1 mmol/L (ref 3.5–5.1)
Sodium: 140 mmol/L (ref 135–145)
Total Bilirubin: 0.3 mg/dL (ref 0.3–1.2)
Total Protein: 7.1 g/dL (ref 6.5–8.1)

## 2019-06-01 LAB — GLUCOSE, CAPILLARY
Glucose-Capillary: 32 mg/dL — CL (ref 70–99)
Glucose-Capillary: 156 mg/dL — ABNORMAL HIGH (ref 70–99)

## 2019-06-01 LAB — HEMOGLOBIN A1C
Hgb A1c MFr Bld: 9.7 % — ABNORMAL HIGH (ref 4.8–5.6)
Mean Plasma Glucose: 231.69 mg/dL

## 2019-06-01 LAB — SARS CORONAVIRUS 2 (TAT 6-24 HRS): SARS Coronavirus 2: NEGATIVE

## 2019-06-01 MED ORDER — FUROSEMIDE 10 MG/ML IJ SOLN
20.0000 mg | Freq: Two times a day (BID) | INTRAMUSCULAR | Status: DC
  Administered 2019-06-01: 20 mg via INTRAVENOUS
  Filled 2019-06-01: qty 4

## 2019-06-01 MED ORDER — DEXTROSE 50 % IV SOLN
INTRAVENOUS | Status: AC
  Administered 2019-06-01: 25 g via INTRAVENOUS
  Filled 2019-06-01: qty 50

## 2019-06-01 MED ORDER — AMIODARONE HCL IN DEXTROSE 360-4.14 MG/200ML-% IV SOLN
30.0000 mg/h | INTRAVENOUS | Status: DC
  Administered 2019-06-01: 60 mg/h via INTRAVENOUS
  Administered 2019-06-02: 30 mg/h via INTRAVENOUS
  Administered 2019-06-02: 60 mg/h via INTRAVENOUS
  Filled 2019-06-01 (×3): qty 200

## 2019-06-01 MED ORDER — PANTOPRAZOLE SODIUM 40 MG PO TBEC: 40.0000 mg | DELAYED_RELEASE_TABLET | Freq: Every day | ORAL | Status: DC

## 2019-06-01 MED ORDER — SODIUM CHLORIDE 0.9% FLUSH: 3.0000 mL | INTRAVENOUS | Status: DC | PRN

## 2019-06-01 MED ORDER — FUROSEMIDE 20 MG PO TABS: 100.0000 mg | ORAL_TABLET | Freq: Every day | ORAL | Status: DC

## 2019-06-01 MED ORDER — ASPIRIN EC 81 MG PO TBEC: 81.0000 mg | DELAYED_RELEASE_TABLET | Freq: Every day | ORAL | Status: DC

## 2019-06-01 MED ORDER — INSULIN ASPART 100 UNIT/ML ~~LOC~~ SOLN: 0.0000 [IU] | Freq: Three times a day (TID) | SUBCUTANEOUS | Status: DC

## 2019-06-01 MED ORDER — METOPROLOL TARTRATE 5 MG/5ML IV SOLN
5.0000 mg | Freq: Four times a day (QID) | INTRAVENOUS | Status: DC
  Administered 2019-06-01 – 2019-06-02 (×2): 5 mg via INTRAVENOUS
  Filled 2019-06-01 (×3): qty 5

## 2019-06-01 MED ORDER — DOCUSATE SODIUM 100 MG PO CAPS
100.0000 mg | ORAL_CAPSULE | Freq: Two times a day (BID) | ORAL | Status: DC
  Administered 2019-06-04 (×2): 100 mg via ORAL
  Filled 2019-06-01 (×3): qty 1

## 2019-06-01 MED ORDER — INSULIN DEGLUDEC 100 UNIT/ML ~~LOC~~ SOPN
18.0000 [IU] | PEN_INJECTOR | Freq: Every day | SUBCUTANEOUS | Status: DC
  Filled 2019-06-01: qty 3

## 2019-06-01 MED ORDER — METOPROLOL SUCCINATE ER 25 MG PO TB24: 25.0000 mg | ORAL_TABLET | Freq: Every day | ORAL | Status: DC

## 2019-06-01 MED ORDER — LEVOTHYROXINE SODIUM 75 MCG PO TABS: 150.0000 ug | ORAL_TABLET | Freq: Every day | ORAL | Status: DC

## 2019-06-01 MED ORDER — DEXTROSE 50 % IV SOLN: 25.0000 g | INTRAVENOUS | Status: AC

## 2019-06-01 MED ORDER — ATORVASTATIN CALCIUM 10 MG PO TABS
20.0000 mg | ORAL_TABLET | Freq: Every day | ORAL | Status: DC
  Administered 2019-06-03: 20 mg via ORAL
  Filled 2019-06-01: qty 2

## 2019-06-01 MED ORDER — SODIUM CHLORIDE 0.9% FLUSH
3.0000 mL | Freq: Two times a day (BID) | INTRAVENOUS | Status: DC
  Administered 2019-06-01 – 2019-06-04 (×4): 3 mL via INTRAVENOUS

## 2019-06-01 MED ORDER — ALBUTEROL SULFATE HFA 108 (90 BASE) MCG/ACT IN AERS
2.0000 | INHALATION_SPRAY | Freq: Four times a day (QID) | RESPIRATORY_TRACT | Status: DC | PRN
  Filled 2019-06-01: qty 6.7

## 2019-06-01 MED ORDER — INSULIN ASPART 100 UNIT/ML ~~LOC~~ SOLN: 0.0000 [IU] | Freq: Every day | SUBCUTANEOUS | Status: DC

## 2019-06-01 MED ORDER — AMIODARONE HCL 200 MG PO TABS: 200.0000 mg | ORAL_TABLET | Freq: Every day | ORAL | Status: DC

## 2019-06-01 MED ORDER — SODIUM CHLORIDE 0.45 % IV SOLN: INTRAVENOUS | Status: DC

## 2019-06-01 MED ORDER — POTASSIUM CHLORIDE CRYS ER 20 MEQ PO TBCR: 20.0000 meq | EXTENDED_RELEASE_TABLET | Freq: Two times a day (BID) | ORAL | Status: DC | PRN

## 2019-06-01 MED ORDER — TRAMADOL HCL 50 MG PO TABS: 50.0000 mg | ORAL_TABLET | Freq: Four times a day (QID) | ORAL | Status: DC | PRN

## 2019-06-01 MED ORDER — INSULIN GLARGINE 100 UNIT/ML ~~LOC~~ SOLN
18.0000 [IU] | Freq: Every day | SUBCUTANEOUS | Status: DC
  Administered 2019-06-01: 18 [IU] via SUBCUTANEOUS
  Filled 2019-06-01 (×2): qty 0.18

## 2019-06-01 MED ORDER — SODIUM CHLORIDE 0.9 % IV SOLN
200.0000 mg | Freq: Two times a day (BID) | INTRAVENOUS | Status: DC
  Administered 2019-06-02: 200 mg via INTRAVENOUS
  Filled 2019-06-01 (×6): qty 4

## 2019-06-01 MED ORDER — LORAZEPAM 2 MG/ML IJ SOLN
1.0000 mg | Freq: Once | INTRAMUSCULAR | Status: DC
  Filled 2019-06-01: qty 1

## 2019-06-01 MED ORDER — SODIUM CHLORIDE 0.9 % IV SOLN: 250.0000 mL | INTRAVENOUS | Status: DC | PRN

## 2019-06-01 MED ORDER — SODIUM CHLORIDE 0.9 % IV SOLN
1500.0000 mg | Freq: Once | INTRAVENOUS | Status: AC
  Administered 2019-06-01: 1500 mg via INTRAVENOUS
  Filled 2019-06-01: qty 30

## 2019-06-01 MED ORDER — PANTOPRAZOLE SODIUM 40 MG IV SOLR
40.0000 mg | INTRAVENOUS | Status: DC
  Administered 2019-06-01 – 2019-06-03 (×3): 40 mg via INTRAVENOUS
  Filled 2019-06-01 (×3): qty 40

## 2019-06-01 MED ORDER — SERTRALINE HCL 50 MG PO TABS
50.0000 mg | ORAL_TABLET | Freq: Every day | ORAL | Status: DC
  Administered 2019-06-03 – 2019-06-04 (×2): 50 mg via ORAL
  Filled 2019-06-01 (×2): qty 1

## 2019-06-01 MED ORDER — ENOXAPARIN SODIUM 30 MG/0.3ML ~~LOC~~ SOLN
30.0000 mg | SUBCUTANEOUS | Status: DC
  Administered 2019-06-01: 30 mg via SUBCUTANEOUS
  Filled 2019-06-01: qty 0.3

## 2019-06-01 MED ORDER — LORAZEPAM 2 MG/ML IJ SOLN
2.0000 mg | Freq: Once | INTRAMUSCULAR | Status: AC
  Administered 2019-06-01: 2 mg via INTRAVENOUS

## 2019-06-01 MED ORDER — NITROGLYCERIN 0.4 MG SL SUBL: 0.4000 mg | SUBLINGUAL_TABLET | SUBLINGUAL | Status: DC | PRN

## 2019-06-01 NOTE — H&P (Signed)
History and Physical    [name] [name] DVV:[id number] DOB: 1934-12-07 DOA: 06/01/2019  PCP: Patient, No Pcp Per (Confirm with patient/family/NH records and if not entered, this has to be entered at [facility] point of entry) Patient coming from: Patient is coming from home  I have personally briefly reviewed patient's old medical records in [place]  Chief Complaint: MS change, slurred speech, facial twitch  HPI: [name] [name] is a 85 y.o. female with medical history significant of CAD s/p Stent RCA, poorly controlled DM, CKD III, hypothyroidism, PAF w/ h/o cardioversion, h/o CVA July 2020. She was found by her son today on there floor by her chair. He noted marked change in mental status, weakness more on the left, facial twitch and slurred speech. She was [facility] to [facility]- for evaluation. Patient cannot give a history  ED Course: ON admission in a. Fib. Noted to have facial twitching right, slow to follow commands. Labs revealed AKI on CKDII with Cr 2.29. CT head w/o acute changes. Dr. Earnestine Leys for neurology saw the patient. Opined CVA vs Sz. Recommendation for admission and further work up with EEG and MRI/MRA.   Review of Systems: Patient unable to give hx or answer questions Unacceptable ROS statements: "10 systems reviewed," "Extensive" (without elaboration).  Acceptable ROS statements: "All others negative," "All others reviewed and are negative," and "All others unremarkable," with at [place] documented Can't double dip - if using for HPI can't use for ROS  Past Medical History:  Diagnosis Date  . Asthma   . CAD (coronary artery disease)    a. 2000 s/p PCI RCA;  b. 2005 PCI of RCA 2/2 ISR; c. 04/2013 Neg MV;  d. 05/2013 Cath: LM 20, LAD 30p, LCX 20p, OM1 90 small, RCA 56m (PTCA - FFR 0.86), PDA 40.  Marland Kitchen Cerebral infarction ([place])   . Chronic diastolic CHF (congestive heart failure) ([place])    a. 10/2013 Echo: EF 60-65%, no rwma, Gr1 DD, mild MR, mildly dil LA.  . CKD (chronic  kidney disease), stage III   . DM (diabetes mellitus) ([place])   . Dyslipidemia   . GERD (gastroesophageal reflux disease)   . Hypertensive heart disease   . Hypothyroidism    hx  . Obesity   . Osteoarthritis   . PAF (paroxysmal atrial fibrillation) ([place])    a. s/p dccv 04/2014;  b. amio/eliquis;  c. 06/2013 bb/ccb d/c 2/2 symptomatic bradycardia; d. 04/2015 recurrent AF->amio increased/bb resumed.  . Rheumatoid arthritis(714.0)   . Right rib fracture    a. 04/2015.  Marland Kitchen Shoulder fracture, left    a. Dr. Mack Guise    Past Surgical History:  Procedure Laterality Date  . CARDIAC CATHETERIZATION  06/11/2013  . cataract surgery    . CORONARY ANGIOPLASTY    . ELECTROPHYSIOLOGIC STUDY N/A 07/25/2015   Procedure: CARDIOVERSION;  Surgeon: Minna Merritts, MD;  Location: [facility] ORS;  Service: Cardiovascular;  Laterality: N/A;  . partial hysterectomy    . RCA stent placement  2000  . REPLACEMENT TOTAL KNEE  2013   right  . restenosis with PTCA placement  2005  . SHOULDER SURGERY  02/04/13     reports that she has never smoked. She has never used smokeless tobacco. She reports that she does not drink alcohol or use drugs.  Allergies  Allergen Reactions  . Oxycodone Nausea And Vomiting    Family History  Problem Relation Age of Onset  . Esophageal cancer Mother   . Diabetes Mother   .  Cancer Mother        esophageal    Unacceptable: Noncontributory, unremarkable, or negative. Acceptable: Family history reviewed and not pertinent (If you reviewed it)  Prior to Admission medications   Medication Sig Start Date End Date Taking? Authorizing Provider  amiodarone (PACERONE) 200 MG tablet Take 1 tablet (200 mg total) by mouth daily. 08/15/15  Yes Dunn, Raymon Mutton, PA-C  aspirin EC 81 MG tablet Take 81 mg by mouth daily.   Yes [provider]  atorvastatin (LIPITOR) 20 MG tablet Take 1 tablet (20 mg total) by mouth daily. Patient taking differently: Take 20 mg by mouth daily at 6 PM.   08/31/15  Yes Shelia Media, MD  ferrous sulfate 325 (65 FE) MG tablet Take 325 mg by mouth daily with breakfast.   Yes [provider]  furosemide (LASIX) 20 MG tablet Take 100 mg by mouth daily.   Yes [provider]  insulin degludec (TRESIBA FLEXTOUCH) 100 UNIT/ML SOPN FlexTouch Pen Inject 18 Units into the skin daily.   Yes [provider]  insulin lispro (HUMALOG) 100 UNIT/ML injection Inject 2-10 Units into the skin 3 (three) times daily before meals. Per sliding scale. If sugar is is >200 is 2units and if over 400 is 10 units   Yes [provider]  latanoprost (XALATAN) 0.005 % ophthalmic solution Place 1 drop into both eyes at bedtime.    Yes [provider]  levothyroxine (SYNTHROID, LEVOTHROID) 150 MCG tablet Take 1 tablet (150 mcg total) by mouth daily before breakfast. 08/31/15  Yes Shelia Media, MD  meclizine (ANTIVERT) 12.5 MG tablet Take 12.5 mg by mouth 2 (two) times daily as needed for dizziness.   Yes [provider]  metFORMIN (GLUCOPHAGE) 500 MG tablet Take 500 mg by mouth 2 (two) times daily with a meal.   Yes [provider]  metoprolol succinate (TOPROL-XL) 25 MG 24 hr tablet Take 25 mg by mouth daily.   Yes [provider]  nitroGLYCERIN (NITROSTAT) 0.4 MG SL tablet Place 0.4 mg under the tongue every 5 (five) minutes as needed for chest pain.   Yes [provider]  omeprazole (PRILOSEC) 20 MG capsule Take 1 capsule (20 mg total) by mouth 2 (two) times daily. Patient taking differently: Take 20 mg by mouth daily.  10/20/15 12/01/27 Yes Shelia Media, MD  potassium chloride SA (K-DUR) 20 MEQ tablet Take 1 tablet (20 mEq total) by mouth 2 (two) times daily as needed (When taking loop diuretic for volume overload). 12/16/18  Yes Beola Cord, MD  sertraline (ZOLOFT) 50 MG tablet Take 1 tablet (50 mg total) by mouth daily. 05/27/14  Yes Shelia Media, MD  albuterol (PROVENTIL  HFA;VENTOLIN HFA) 108 (90 Base) MCG/ACT inhaler Inhale 2 puffs into the lungs every 6 (six) hours as needed for wheezing or shortness of breath. 07/27/15   Alford Highland, MD    Physical Exam: Vitals:   06/01/19 1500 06/01/19 1807 06/01/19 1830 06/01/19 1900  BP:  132/80 (!) 154/97 122/64  Pulse:  99 86 84  Resp:  15 14 (!) 24  Temp:      TempSrc:      SpO2:  97% 98% 95%  Weight: 82 kg       Constitutional: NAD, calm, comfortable Vitals:   06/01/19 1500 06/01/19 1807 06/01/19 1830 06/01/19 1900  BP:  132/80 (!) 154/97 122/64  Pulse:  99 86 84  Resp:  15 14 (!) 24  Temp:  TempSrc:      SpO2:  97% 98% 95%  Weight: 82 kg      General -  Haggard appearing woman who is not responsive, very restless in the bed. Eyes: PERRL, lids and conjunctivae normal ENMT: Mucous membranes are moist.  Neck: normal, supple, no masses, no thyromegaly Respiratory: No increased WOB, no rales or wheezing Cardiovascular: IRIR, no murmurs / rubs / gallops. No extremity edema. 2+ pedal pulses. No carotid bruits.  Abdomen: no tenderness, no masses palpated. No hepatosplenomegaly. Bowel sounds positive.  Musculoskeletal: no clubbing / cyanosis. No joint deformity upper. Hammer toe deformity right foot.. Good ROM, no contractures. Normal muscle tone.  Skin: no rashes, lesions. 2 cm decubitus right hip, mild erythema left hip, erythema right and left buttock Neurologic: Very stiff, question of posturing. Not responsive to commands, No communicative. CN: PERRLA, EOMI, flattening right nasolabial fold otherwise without asymmetry, blowing out her cheeks, no choking. Between agitated movement and some rigidity DTRs could not be tested.   Psychiatric: Agitated. Not responsive.    (Anything < 9 systems with 2 bullets each down codes to level 1) (If patient refuses exam can't bill higher level) (Make sure to document decubitus ulcers present on admission -- if possible -- and whether patient has chronic  indwelling catheter at time of admission)  Labs on Admission: I have personally reviewed following labs and imaging studies  CBC: Recent Labs  Lab 06/01/19 1406  WBC 12.3*  NEUTROABS 10.0*  HGB 13.7  HCT 43.3  MCV 95.0  PLT 338   Basic Metabolic Panel: Recent Labs  Lab 06/01/19 1406  NA 140  K 4.1  CL 101  CO2 26  GLUCOSE 122*  BUN 44*  CREATININE 2.29*  CALCIUM 9.0   GFR: CrCl cannot be calculated (Unknown ideal weight.). Liver Function Tests: Recent Labs  Lab 06/01/19 1406  AST 25  ALT 17  ALKPHOS 101  BILITOT 0.3  PROT 7.1  ALBUMIN 3.5   No results for input(s): LIPASE, AMYLASE in the last 168 hours. No results for input(s): AMMONIA in the last 168 hours. Coagulation Profile: No results for input(s): INR, PROTIME in the last 168 hours. Cardiac Enzymes: No results for input(s): CKTOTAL, CKMB, CKMBINDEX, TROPONINI in the last 168 hours. BNP (last 3 results) No results for input(s): PROBNP in the last 8760 hours. HbA1C: No results for input(s): HGBA1C in the last 72 hours. CBG: No results for input(s): GLUCAP in the last 168 hours. Lipid Profile: No results for input(s): CHOL, HDL, LDLCALC, TRIG, CHOLHDL, LDLDIRECT in the last 72 hours. Thyroid Function Tests: No results for input(s): TSH, T4TOTAL, FREET4, T3FREE, THYROIDAB in the last 72 hours. Anemia Panel: No results for input(s): VITAMINB12, FOLATE, FERRITIN, TIBC, IRON, RETICCTPCT in the last 72 hours. Urine analysis:    Component Value Date/Time   COLORURINE YELLOW 06/01/2019 1600   APPEARANCEUR HAZY (A) 06/01/2019 1600   APPEARANCEUR Hazy 05/16/2014 1433   LABSPEC 1.013 06/01/2019 1600   LABSPEC 1.006 05/16/2014 1433   PHURINE 5.0 06/01/2019 1600   GLUCOSEU NEGATIVE 06/01/2019 1600   GLUCOSEU Negative 05/16/2014 1433   HGBUR SMALL (A) 06/01/2019 1600   BILIRUBINUR NEGATIVE 06/01/2019 1600   BILIRUBINUR Negative 05/16/2014 1433   KETONESUR NEGATIVE 06/01/2019 1600   PROTEINUR 100 (A)  06/01/2019 1600   UROBILINOGEN 0.2 06/25/2014 1725   NITRITE POSITIVE (A) 06/01/2019 1600   LEUKOCYTESUR SMALL (A) 06/01/2019 1600   LEUKOCYTESUR Negative 05/16/2014 1433    Radiological Exams on Admission: CT Head  Wo Contrast  Result Date: 06/01/2019 CLINICAL DATA:  Slurred speech left-sided facial droop EXAM: CT HEAD WITHOUT CONTRAST TECHNIQUE: Contiguous axial images were obtained from the base of the skull through the vertex without intravenous contrast. COMPARISON:  CT head 03/06/2019 FINDINGS: Brain: Generalized atrophy. Chronic infarct left frontal lobe and chronic infarct right frontal parietal lobe unchanged. Chronic infarct left thalamus. Negative for acute infarct, hemorrhage, mass. Vascular: Atherosclerotic calcification in the carotid and vertebral arteries. Negative for hyperdense vessel Skull: Negative Sinuses/Orbits: Paranasal sinuses clear.  Bilateral cataract surgery Other: None IMPRESSION: Atrophy and chronic ischemic changes stable from prior study. No acute abnormality Electronically Signed   By: Marlan Palau M.D.   On: 06/01/2019 14:41   DG Hip Unilat With Pelvis 2-3 Views Left  Result Date: 06/01/2019 CLINICAL DATA:  Found by son seated in chair altered with slurred speech and left-sided facial droop EXAM: DG HIP (WITH OR WITHOUT PELVIS) 2-3V LEFT COMPARISON:  Radiograph 12/01/2018 FINDINGS: The osseous structures appear diffusely demineralized which may limit detection of small or nondisplaced fractures. Periprosthetic lucency involving the right hip hemiarthroplasty is similar to comparison radiographs and therefore nonspecific though loosening cannot be fully excluded. Moderate left hip arthrosis. Bilateral SI joint degenerative change as well as additional degenerative features in the lower lumbar spine. The sacrum is poorly assessed given extensive overlying bowel gas. Vascular calcium noted in the medial thighs and pelvis. IMPRESSION: 1. The osseous structures appear  diffusely demineralized which may limit detection of small or nondisplaced fractures. 2. Periprosthetic lucency involving the right hip hemiarthroplasty is similar to comparison radiographs and therefore nonspecific though loosening cannot be fully excluded. Electronically Signed   By: Kreg Shropshire M.D.   On: 06/01/2019 15:56    EKG: Independently reviewed. A. Fib, LVH, ? Old anterior infarct  Assessment/Plan Active Problems:   Facial twitching   Slurred speech   CAD (coronary artery disease)   Hypertension   Poorly controlled type 2 diabetes mellitus with circulatory disorder (HCC)   Hypothyroidism   Atrial fibrillation (HCC)   CVA (cerebrovascular accident) (HCC)  (please populate well all problems here in Problem List. (For example, if patient is on BP meds at home and you resume or decide to hold them, it is a problem that needs to be her. Same for CAD, COPD, HLD and so on)   1. Neuro - patient presenting with MS changes, right facial twitch. At admission exam agitated, restless yet stiff. Per RN no real changes since admission or since Neuro eval.  CT head negative. MRI 12/02/18 large MCA CVA right, remote L MCA CVA, multiple remote lacunar infarcts. MRA 12/02/18 no vascular occlusive disease. Echo 12/02/18 - EF 55-60%, no wall motion abnl, no valvular abnl.NOw with symptoms which may represent Sz vs new CVA. Per Neuro - patient loaded with fosphenytoin. EEG ordered. MRI/MRA ordered but patient to restless for study. Has been given ativan Plan  Tele admit  NPO - converted key meds to IV until able to swallow  Bedside swallow- SLP if fails  IV Phenytoin bid  PT/OT eval  MRI/MRA - ordered for when patient able to have study  EEG ordered and pending  Neurology is on board.  2. A. Fib - h/o PAF s/p ablation. Was not on anticoagulation. Now in a. Fig Plan Continue home meds: betablocker and amiodarone but convert to IV until she can swallow  3. DM - hold metformin Plan Glycemic protocol  with ss  4. HTN - continue home meds - IV  until able to swallow   DVT prophylaxis: lovenox (Lovenox/Heparin/SCD's/anticoagulated/None (if comfort care) Code Status: full code (Full/Partial (specify details) Family Communication: no answer at contacts # (Specify name, relationship. Do not write "discussed with patient". Specify tel # if discussed over the phone) Disposition Plan: To be determined (specify when and where you expect patient to be discharged) Consults called: Dr. Floyde Parkins - neurology (with names) Admission status: inpatient/tele (inpatient / obs / tele / medical floor / SDU)   Illene Regulus MD [facility] Pager [phone]  If 7PM-7AM, please contact night-coverage [email or web address] Password [facility]  06/01/2019, 7:23 PM

## 2019-06-01 NOTE — Progress Notes (Signed)
EEG complete - results pending 

## 2019-06-01 NOTE — ED Notes (Signed)
Patient came in saturated brief from home. Soiled with urine and feces. Bedside cleaning done. Patient has skin breakdown to sacrum, right hip and feet. MD aware.

## 2019-06-01 NOTE — ED Triage Notes (Signed)
Pt arrives via EMS from home with LSN 8 pm last night by son. Son found pt sitting upright in floor by her chair, altered, slurred speech and left sided facial droop. Moving all extremities. Son states she is off her blood thinner since Oct/Nov.

## 2019-06-01 NOTE — ED Provider Notes (Signed)
Medical screening examination/treatment/procedure(s) were conducted as a shared visit with non-physician practitioner(s) and myself.  I personally evaluated the patient during the encounter. Briefly, the patient is a 85 y.o. female is an 85 year old female with history of CAD, stroke but does not appear that she has chronic deficits per family who presents to the ED with altered mental status.  Patient with normal vitals.  No fever.  Last known normal was last night.  Patient was found this morning confused and sitting down on the floor.  She does have atrial fibrillation but does not appear to be on blood thinner.  She is LVO negative as she does not appear to have true unilateral weakness.  She has some twitching of the left side of her face and concern for possible seizure-like activity.  Possibly a stroke but seems less likely.  CT scan of the head was unremarkable.  Lab work showed acute on chronic renal disease.  Otherwise lab work was unremarkable.  Patient given 2 mg of Ativan for suspected focal seizure.  Neurology has been consulted and they recommend loading the patient with fosphenytoin.  Will admit and get an MRI and EEG for further work-up.  She appears to be hemodynamically stable.  She is able to follow commands despite this twitching.  But it appears to be affecting her speech.  Differential includes focal seizures versus stroke.  This chart was dictated using voice recognition software.  Despite best efforts to proofread,  errors can occur which can change the documentation meaning.     EKG Interpretation  Date/Time:  Tuesday June 01 2019 13:47:27 EST Ventricular Rate:  76 PR Interval:    QRS Duration: 116 QT Interval:  391 QTC Calculation: 440 R Axis:   -23 Text Interpretation: Atrial fibrillation Paired ventricular premature complexes LVH with secondary repolarization abnormality Probable anterior infarct, age indeterminate Minimal ST elevation, inferior leads Confirmed by Virgina Norfolk [phone]) on 06/01/2019 1:51:00 PM           Virgina Norfolk, DO 06/01/19 1529

## 2019-06-01 NOTE — ED Notes (Signed)
Admitting at bedside 

## 2019-06-01 NOTE — Progress Notes (Signed)
RN paged NP for hypoglycemic event with CBG of 32 on arrival to floor.  Changed sliding scale to moderate from resistant.  No change in Lantus.

## 2019-06-01 NOTE — ED Notes (Signed)
EEG in progress at bedside

## 2019-06-01 NOTE — ED Provider Notes (Signed)
[facility] EMERGENCY DEPARTMENT Provider Note   CSN: [id number] Arrival date & time: 06/01/19  1336     History Chief Complaint  Patient presents with  . Altered Mental Status    [name] [name] is a 85 y.o. female.  HPI Patient presents to the emergency department with blurred mental status slurred speech and facial twitching.  The son found her this way this morning when he went to check on her.  The patient was normal last night around 8 PM.  The son states that she was sitting in the floor when he found her.  The patient did not appear injured per his report.  The patient can only tell me her name but cannot give me any other information.  Past Medical History:  Diagnosis Date  . Asthma   . CAD (coronary artery disease)    a. 2000 s/p PCI RCA;  b. 2005 PCI of RCA 2/2 ISR; c. 04/2013 Neg MV;  d. 05/2013 Cath: LM 20, LAD 30p, LCX 20p, OM1 90 small, RCA 48m (PTCA - FFR 0.86), PDA 40.  Marland Kitchen Cerebral infarction (HCC)   . Chronic diastolic CHF (congestive heart failure) (HCC)    a. 10/2013 Echo: EF 60-65%, no rwma, Gr1 DD, mild MR, mildly dil LA.  . CKD (chronic kidney disease), stage III   . DM (diabetes mellitus) (HCC)   . Dyslipidemia   . GERD (gastroesophageal reflux disease)   . Hypertensive heart disease   . Hypothyroidism    hx  . Obesity   . Osteoarthritis   . PAF (paroxysmal atrial fibrillation) (HCC)    a. s/p dccv 04/2014;  b. amio/eliquis;  c. 06/2013 bb/ccb d/c 2/2 symptomatic bradycardia; d. 04/2015 recurrent AF->amio increased/bb resumed.  . Rheumatoid arthritis(714.0)   . Right rib fracture    a. 04/2015.  Marland Kitchen Shoulder fracture, left    a. Dr. Martha Clan    Patient Active Problem List   Diagnosis Date Noted  . Weak   . Acute on chronic renal insufficiency   . Hyperglycemia   . CVA (cerebral vascular accident) (HCC) 12/01/2018  . Stage 2 Pressure ulcer, sacrum 11/25/2015  . 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 Foreign  body of hip with infection   . Essential hypertension   . Pulmonary hypertension (HCC)   . Acute renal failure (HCC)   . Uncontrolled type 2 diabetes mellitus with complication (HCC)   . Postoperative wound infection of right hip   . MSSA infection, non-invasive   . Infection caused by Enterobacter cloacae   . Paroxysmal atrial fibrillation (HCC)   . Acute blood loss anemia   . Neurogenic bladder   . Sinus bradycardia 11/20/2015  . HCAP (healthcare-associated pneumonia)   . Acute renal failure (ARF) (HCC)   . Pressure ulcer 11/17/2015  . Sepsis (HCC) 11/16/2015  . AKI (acute kidney injury) (HCC)   . Metabolic acidosis   . Hypocalcemia   . Acute respiratory failure with hypoxia (HCC)   . Hypertensive heart disease   . Acute on chronic diastolic CHF (congestive heart failure) (HCC)   . Coronary artery disease involving native coronary artery of native heart without angina pectoris   . Persistent atrial fibrillation (HCC)   . Dyspnea 07/18/2015  . Follicular acne 07/07/2015  . Acute diastolic heart failure (HCC) 06/16/2015  . Routine history and physical examination of adult 01/13/2015  . Dermatitis 06/27/2014  . CKD (chronic kidney disease), stage III   . Bradycardia 06/24/2014  .  Diabetes mellitus type 2, uncontrolled (HCC) 03/25/2014  . Depression 03/25/2014  . Medicare annual wellness visit, subsequent 01/10/2014  . Anxiety state 01/10/2014  . Carotid stenosis 11/16/2013  . CVA (cerebral infarction) 10/21/2013  . Screening for breast cancer 01/14/2013  . Poorly controlled type 2 diabetes mellitus with circulatory disorder (HCC) 02/04/2012  . Chronic UTI 06/12/2011  . GERD (gastroesophageal reflux disease) 06/12/2011  . Hyperlipidemia 06/12/2011  . Hypertension 05/17/2011  . Hypothyroidism 03/07/2011  . Chronic back pain 02/21/2011  . Morbid obesity (HCC) 08/17/2008  . CAD (coronary artery disease) 08/17/2008  . Atrial fibrillation (HCC) 08/17/2008  . Osteoarthritis,  knee 08/17/2008    Past Surgical History:  Procedure Laterality Date  . CARDIAC CATHETERIZATION  06/11/2013  . cataract surgery    . CORONARY ANGIOPLASTY    . ELECTROPHYSIOLOGIC STUDY N/A 07/25/2015   Procedure: CARDIOVERSION;  Surgeon: Antonieta Iba, MD;  Location: [facility] ORS;  Service: Cardiovascular;  Laterality: N/A;  . partial hysterectomy    . RCA stent placement  2000  . REPLACEMENT TOTAL KNEE  2013   right  . restenosis with PTCA placement  2005  . SHOULDER SURGERY  02/04/13     OB History   No obstetric history on file.     Family History  Problem Relation Age of Onset  . Esophageal cancer Mother   . Diabetes Mother   . Cancer Mother        esophageal     Social History   Tobacco Use  . Smoking status: Never Smoker  . Smokeless tobacco: Never Used  . Tobacco comment: former passive smoker  Substance Use Topics  . Alcohol use: No  . Drug use: No    Home Medications Prior to Admission medications   Medication Sig Start Date End Date Taking? Authorizing Provider  amiodarone (PACERONE) 200 MG tablet Take 1 tablet (200 mg total) by mouth daily. 08/15/15  Yes Dunn, Raymon Mutton, PA-C  aspirin EC 81 MG tablet Take 81 mg by mouth daily.   Yes [provider]  atorvastatin (LIPITOR) 20 MG tablet Take 1 tablet (20 mg total) by mouth daily. Patient taking differently: Take 20 mg by mouth daily at 6 PM.  08/31/15  Yes Shelia Media, MD  ferrous sulfate 325 (65 FE) MG tablet Take 325 mg by mouth daily with breakfast.   Yes [provider]  furosemide (LASIX) 20 MG tablet Take 100 mg by mouth daily.   Yes [provider]  insulin degludec (TRESIBA FLEXTOUCH) 100 UNIT/ML SOPN FlexTouch Pen Inject 18 Units into the skin daily.   Yes [provider]  insulin lispro (HUMALOG) 100 UNIT/ML injection Inject 2-10 Units into the skin 3 (three) times daily before meals. Per sliding scale. If sugar is is >200 is 2units and if over 400 is 10 units    Yes [provider]  latanoprost (XALATAN) 0.005 % ophthalmic solution Place 1 drop into both eyes at bedtime.    Yes [provider]  levothyroxine (SYNTHROID, LEVOTHROID) 150 MCG tablet Take 1 tablet (150 mcg total) by mouth daily before breakfast. 08/31/15  Yes Shelia Media, MD  meclizine (ANTIVERT) 12.5 MG tablet Take 12.5 mg by mouth 2 (two) times daily as needed for dizziness.   Yes [provider]  metFORMIN (GLUCOPHAGE) 500 MG tablet Take 500 mg by mouth 2 (two) times daily with a meal.   Yes [provider]  metoprolol succinate (TOPROL-XL) 25 MG 24 hr tablet Take 25  mg by mouth daily.   Yes [provider]  nitroGLYCERIN (NITROSTAT) 0.4 MG SL tablet Place 0.4 mg under the tongue every 5 (five) minutes as needed for chest pain.   Yes [provider]  omeprazole (PRILOSEC) 20 MG capsule Take 1 capsule (20 mg total) by mouth 2 (two) times daily. Patient taking differently: Take 20 mg by mouth daily.  10/20/15 12/01/27 Yes Jackolyn Confer, MD  potassium chloride SA (K-DUR) 20 MEQ tablet Take 1 tablet (20 mEq total) by mouth 2 (two) times daily as needed (When taking loop diuretic for volume overload). 12/16/18  Yes Neva Seat, MD  sertraline (ZOLOFT) 50 MG tablet Take 1 tablet (50 mg total) by mouth daily. 05/27/14  Yes Jackolyn Confer, MD  albuterol (PROVENTIL HFA;VENTOLIN HFA) 108 (90 Base) MCG/ACT inhaler Inhale 2 puffs into the lungs every 6 (six) hours as needed for wheezing or shortness of breath. 07/27/15   Loletha Grayer, MD    Allergies    Oxycodone  Review of Systems   Review of Systems Level 5 caveat applies due to altered mental status Physical Exam Updated Vital Signs BP 126/80   Pulse 96   Temp 98.4 F (36.9 C) (Oral)   Resp 16   SpO2 96%   Physical Exam Vitals and nursing note reviewed.  Constitutional:      General: She is not in acute distress.    Appearance: She is well-developed.  HENT:      Head: Normocephalic and atraumatic.  Eyes:     Pupils: Pupils are equal, round, and reactive to light.  Cardiovascular:     Rate and Rhythm: Normal rate and regular rhythm.     Heart sounds: Normal heart sounds. No murmur. No friction rub. No gallop.   Pulmonary:     Effort: Pulmonary effort is normal. No respiratory distress.     Breath sounds: Normal breath sounds. No wheezing.  Musculoskeletal:     Cervical back: Normal range of motion and neck supple.  Skin:    General: Skin is warm and dry.     Capillary Refill: Capillary refill takes less than 2 seconds.     Findings: No erythema or rash.  Neurological:     Mental Status: She is alert.     Motor: No abnormal muscle tone.     Coordination: Coordination normal.     Comments: Patient can follow some commands.  She will raise her arms but is very slowed to respond to the instruction.  Patient will say her name but cannot tell me any other information.  Psychiatric:        Behavior: Behavior normal.     ED Results / Procedures / Treatments   Labs (all labs ordered are listed, but only abnormal results are displayed) Labs Reviewed  COMPREHENSIVE METABOLIC PANEL - Abnormal; Notable for the following components:      Result Value   Glucose, Bld 122 (*)    BUN 44 (*)    Creatinine, Ser 2.29 (*)    GFR calc non Af Amer 19 (*)    GFR calc Af Amer 22 (*)    All other components within normal limits  CBC WITH DIFFERENTIAL/PLATELET - Abnormal; Notable for the following components:   WBC 12.3 (*)    Neutro Abs 10.0 (*)    All other components within normal limits  URINE CULTURE  URINALYSIS, ROUTINE W REFLEX MICROSCOPIC    EKG EKG Interpretation  Date/Time:  Tuesday June 01 2019 13:47:27  EST Ventricular Rate:  76 PR Interval:    QRS Duration: 116 QT Interval:  391 QTC Calculation: 440 R Axis:   -23 Text Interpretation: Atrial fibrillation Paired ventricular premature complexes LVH with secondary repolarization  abnormality Probable anterior infarct, age indeterminate Minimal ST elevation, inferior leads Confirmed by Virgina Norfolk [phone]) on 06/01/2019 1:51:00 PM   Radiology CT Head Wo Contrast  Result Date: 06/01/2019 CLINICAL DATA:  Slurred speech left-sided facial droop EXAM: CT HEAD WITHOUT CONTRAST TECHNIQUE: Contiguous axial images were obtained from the base of the skull through the vertex without intravenous contrast. COMPARISON:  CT head 03/06/2019 FINDINGS: Brain: Generalized atrophy. Chronic infarct left frontal lobe and chronic infarct right frontal parietal lobe unchanged. Chronic infarct left thalamus. Negative for acute infarct, hemorrhage, mass. Vascular: Atherosclerotic calcification in the carotid and vertebral arteries. Negative for hyperdense vessel Skull: Negative Sinuses/Orbits: Paranasal sinuses clear.  Bilateral cataract surgery Other: None IMPRESSION: Atrophy and chronic ischemic changes stable from prior study. No acute abnormality Electronically Signed   By: Marlan Palau M.D.   On: 06/01/2019 14:41    Procedures Procedures (including critical care time)  Medications Ordered in ED Medications  fosPHENYtoin (CEREBYX) 20 mg PE/kg in sodium chloride 0.9 % 50 mL IVPB (has no administration in time range)  LORazepam (ATIVAN) injection 2 mg (2 mg Intravenous Given 06/01/19 1513)    ED Course  I have reviewed the triage vital signs and the nursing notes.  Pertinent labs & imaging results that were available during my care of the patient were reviewed by me and considered in my medical decision making (see chart for details).    MDM Rules/Calculators/A&P                     With Dr. Otelia Limes who went and evaluated the patient and felt that she will need admission and he is going to load her with fosphenytoin, do an EEG and we are going to order an MRI and MRA of her head.  The initial CT did not show any acute abnormality.  Started as the patient is having some sort of focal  seizure generating in the right side of her brain. Final Clinical Impression(s) / ED Diagnoses Final diagnoses:  None    Rx / DC Orders ED Discharge Orders    None       Charlestine Night, PA-C 06/01/19 1517    Virgina Norfolk, DO 06/01/19 1545

## 2019-06-01 NOTE — Consult Note (Addendum)
Neurology Consultation  Reason for Consult: Seizure versus stroke  Referring Physician: Dr. Lockie Mola  CC: AMS and possible seizure  History is obtained from: chart  HPI: [name] [name] is a 85 y.o. female with Hx of HTN, CKD, diastolic HF, CAD, PAF and cerebral infarct. Patient found to be in A-fib on arrival on no anticoagulant. Patient was brought to hospital due to confusion, sitting on the floor. Neurology was asked to evaluate for stroke versus seizure.   Patient was found this AM sitting upright on the floor by her chair slurring her words and with left sided facial droop. While in the ED lab work showed acute on chronic renal disease.  Otherwise lab work was unremarkable.  Patient given 2 mg of Ativan for suspected focal seizure.  Neurology was consulted and recommend loading the patient with fosphenytoin.  Will admit and get an MRI and EEG for further work-up.  Initial exam revealed continuous rhythmic alternating with semirhythmic twitching of left side of face. AMS and left sided weakness were also noted.   On follow up exam after fosphenytoin load, twitching had resolved but patient was moving right side greater than left with forced right gaze.    ED course  Labs-to evaluate for infection or electrolyte abnormality CT head to evaluate for Bleed, mass or stroke UA to evaluate for UTI  Chart review (seen back in 12/03/2018 for [facility] branch infarct with petechial hemorrhage embolic secondary to Afib and was on Eliquis at that time. CT at that time showed old LMCA infarct. MRA showed no LVO or flow limiting anterior stenosis. Carotid dopplers 1-39% bilaterally. Echo EF 55-60%. )  LKW: 2000 tpa given?: no, out of window Premorbid modified Rankin scale (mRS): 1   Past Medical History:  Diagnosis Date  . Asthma   . CAD (coronary artery disease)    a. 2000 s/p PCI RCA;  b. 2005 PCI of RCA 2/2 ISR; c. 04/2013 Neg MV;  d. 05/2013 Cath: LM 20, LAD 30p, LCX 20p, OM1 90 small, RCA  19m (PTCA - FFR 0.86), PDA 40.  Marland Kitchen Cerebral infarction (HCC)   . Chronic diastolic CHF (congestive heart failure) (HCC)    a. 10/2013 Echo: EF 60-65%, no rwma, Gr1 DD, mild MR, mildly dil LA.  . CKD (chronic kidney disease), stage III   . DM (diabetes mellitus) (HCC)   . Dyslipidemia   . GERD (gastroesophageal reflux disease)   . Hypertensive heart disease   . Hypothyroidism    hx  . Obesity   . Osteoarthritis   . PAF (paroxysmal atrial fibrillation) (HCC)    a. s/p dccv 04/2014;  b. amio/eliquis;  c. 06/2013 bb/ccb d/c 2/2 symptomatic bradycardia; d. 04/2015 recurrent AF->amio increased/bb resumed.  . Rheumatoid arthritis(714.0)   . Right rib fracture    a. 04/2015.  Marland Kitchen Shoulder fracture, left    a. Dr. Martha Clan     Family History  Problem Relation Age of Onset  . Esophageal cancer Mother   . Diabetes Mother   . Cancer Mother        esophageal    Social History:   reports that she has never smoked. She has never used smokeless tobacco. She reports that she does not drink alcohol or use drugs.  Medications  Current Facility-Administered Medications:  .  fosPHENYtoin (CEREBYX) 1,500 mg PE in sodium chloride 0.9 % 50 mL IVPB, 1,500 mg PE, Intravenous, Once, Curatolo, Adam, DO  Current Outpatient Medications:  .  amiodarone (PACERONE) 200 MG tablet,  Take 1 tablet (200 mg total) by mouth daily., Disp: 30 tablet, Rfl: 3 .  aspirin EC 81 MG tablet, Take 81 mg by mouth daily., Disp: , Rfl:  .  atorvastatin (LIPITOR) 20 MG tablet, Take 1 tablet (20 mg total) by mouth daily. (Patient taking differently: Take 20 mg by mouth daily at 6 PM. ), Disp: 90 tablet, Rfl: 3 .  ferrous sulfate 325 (65 FE) MG tablet, Take 325 mg by mouth daily with breakfast., Disp: , Rfl:  .  furosemide (LASIX) 20 MG tablet, Take 100 mg by mouth daily., Disp: , Rfl:  .  insulin degludec (TRESIBA FLEXTOUCH) 100 UNIT/ML SOPN FlexTouch Pen, Inject 18 Units into the skin daily., Disp: , Rfl:  .  insulin lispro  (HUMALOG) 100 UNIT/ML injection, Inject 2-10 Units into the skin 3 (three) times daily before meals. Per sliding scale. If sugar is is >200 is 2units and if over 400 is 10 units, Disp: , Rfl:  .  latanoprost (XALATAN) 0.005 % ophthalmic solution, Place 1 drop into both eyes at bedtime. , Disp: , Rfl:  .  levothyroxine (SYNTHROID, LEVOTHROID) 150 MCG tablet, Take 1 tablet (150 mcg total) by mouth daily before breakfast., Disp: 90 tablet, Rfl: 1 .  meclizine (ANTIVERT) 12.5 MG tablet, Take 12.5 mg by mouth 2 (two) times daily as needed for dizziness., Disp: , Rfl:  .  metFORMIN (GLUCOPHAGE) 500 MG tablet, Take 500 mg by mouth 2 (two) times daily with a meal., Disp: , Rfl:  .  metoprolol succinate (TOPROL-XL) 25 MG 24 hr tablet, Take 25 mg by mouth daily., Disp: , Rfl:  .  nitroGLYCERIN (NITROSTAT) 0.4 MG SL tablet, Place 0.4 mg under the tongue every 5 (five) minutes as needed for chest pain., Disp: , Rfl:  .  omeprazole (PRILOSEC) 20 MG capsule, Take 1 capsule (20 mg total) by mouth 2 (two) times daily. (Patient taking differently: Take 20 mg by mouth daily. ), Disp: 180 capsule, Rfl: 3 .  potassium chloride SA (K-DUR) 20 MEQ tablet, Take 1 tablet (20 mEq total) by mouth 2 (two) times daily as needed (When taking loop diuretic for volume overload)., Disp: 30 tablet, Rfl: 0 .  sertraline (ZOLOFT) 50 MG tablet, Take 1 tablet (50 mg total) by mouth daily., Disp: 90 tablet, Rfl: 0 .  albuterol (PROVENTIL HFA;VENTOLIN HFA) 108 (90 Base) MCG/ACT inhaler, Inhale 2 puffs into the lungs every 6 (six) hours as needed for wheezing or shortness of breath., Disp: 1 Inhaler, Rfl: 0  GYF:VCBSWH to obtain due to altered mental status.    Exam: Current vital signs: BP 126/80   Pulse 96   Temp 98.4 F (36.9 C) (Oral)   Resp 16   Wt 82 kg   SpO2 96%   BMI 32.02 kg/m  Vital signs in last 24 hours: Temp:  [98.4 F (36.9 C)] 98.4 F (36.9 C) (01/12 1341) Pulse Rate:  [96] 96 (01/12 1341) Resp:  [16] 16  (01/12 1341) BP: (126)/(80) 126/80 (01/12 1341) SpO2:  [96 %] 96 % (01/12 1341) Weight:  [82 kg] 82 kg (01/12 1500)   Constitutional: Appears cachectic Eyes: No scleral injection HENT: No OP obstrucion Head: Normocephalic.  Cardiovascular: irregular irregular.  Respiratory: Effort normal, non-labored breathing GI: Soft.  No distension. There is no tenderness.  Skin: WDI  Neuro:  Initial exam revealed continuous rhythmic alternating with semirhythmic twitching of left side of face. AMS and left sided weakness were also noted.   Follow up exam after  fosphenytoin load: Mental Status: Patient is awake, moving all extremities antigravity but right grater than left. Responds to pain. Not following commands.  Cranial Nerves: II: no blink to threat able to be obtained  III,IV, VI: forced right gaze deviation Pupils equal, round and reactive to light V: winces to pain on the right VII: left facial droop with left air leak XII: tongue is midline  Motor: Able to move bilateral legs and arms antigravity but right is stronger than left.  Sensory: Withdraws from noxious stimuli in all extremities.  Deep Tendon Reflexes: 2+ and symmetric in the biceps and patellae.  Plantars: Up on left and down on the right Cerebellar: No dysmetria noted with arm movements.  Labs I have reviewed labs in epic and the results pertinent to this consultation are:  CBC    Component Value Date/Time   WBC 12.3 (H) 06/01/2019 1406   RBC 4.56 06/01/2019 1406   HGB 13.7 06/01/2019 1406   HGB 10.6 (L) 05/18/2014 0437   HCT 43.3 06/01/2019 1406   HCT 32.6 (L) 05/18/2014 0437   PLT 338 06/01/2019 1406   PLT 237 05/18/2014 0437   MCV 95.0 06/01/2019 1406   MCV 88 05/18/2014 0437   MCH 30.0 06/01/2019 1406   MCHC 31.6 06/01/2019 1406   RDW 13.2 06/01/2019 1406   RDW 15.8 (H) 05/18/2014 0437   LYMPHSABS 1.4 06/01/2019 1406   LYMPHSABS 2.0 05/18/2014 0437   MONOABS 0.8 06/01/2019 1406   MONOABS 0.7  05/18/2014 0437   EOSABS 0.0 06/01/2019 1406   EOSABS 0.3 05/18/2014 0437   BASOSABS 0.0 06/01/2019 1406   BASOSABS 0.1 05/18/2014 0437    CMP     Component Value Date/Time   NA 140 06/01/2019 1406   NA 139 08/15/2015 1455   NA 146 (H) 05/18/2014 0437   K 4.1 06/01/2019 1406   K 3.7 05/18/2014 0437   CL 101 06/01/2019 1406   CL 110 (H) 05/18/2014 0437   CO2 26 06/01/2019 1406   CO2 29 05/18/2014 0437   GLUCOSE 122 (H) 06/01/2019 1406   GLUCOSE 87 05/18/2014 0437   BUN 44 (H) 06/01/2019 1406   BUN 27 08/15/2015 1455   BUN 12 05/18/2014 0437   CREATININE 2.29 (H) 06/01/2019 1406   CREATININE 1.01 05/18/2014 0437   CALCIUM 9.0 06/01/2019 1406   CALCIUM 8.2 (L) 05/18/2014 0437   PROT 7.1 06/01/2019 1406   PROT 7.0 05/16/2014 1008   ALBUMIN 3.5 06/01/2019 1406   ALBUMIN 3.1 (L) 05/16/2014 1008   AST 25 06/01/2019 1406   AST 19 05/16/2014 1008   ALT 17 06/01/2019 1406   ALT 23 05/16/2014 1008   ALKPHOS 101 06/01/2019 1406   ALKPHOS 102 05/16/2014 1008   BILITOT 0.3 06/01/2019 1406   BILITOT 0.5 05/16/2014 1008   GFRNONAA 19 (L) 06/01/2019 1406   GFRNONAA 56 (L) 05/18/2014 0437   GFRNONAA 44 (L) 02/06/2014 1215   GFRAA 22 (L) 06/01/2019 1406   GFRAA >60 05/18/2014 0437   GFRAA 51 (L) 02/06/2014 1215    Lipid Panel     Component Value Date/Time   CHOL 145 12/02/2018 0352   CHOL 170 05/18/2013 0501   TRIG 105 12/02/2018 0352   TRIG 133 05/18/2013 0501   HDL 46 12/02/2018 0352   HDL 47 05/18/2013 0501   CHOLHDL 3.2 12/02/2018 0352   VLDL 21 12/02/2018 0352   VLDL 27 05/18/2013 0501   LDLCALC 78 12/02/2018 0352   LDLCALC 96 05/18/2013 0501  LDLDIRECT 92.7 01/14/2013 1158     Imaging I have reviewed the images obtained:  CT-scan of the brain--Atrophy and chronic ischemic changes stable from prior study. No acute abnormality.  Felicie Morn PA-C [facility] [phone] 06/01/2019, 3:40 PM     Assessment:  85 year old female found with AMS at  house and brought to ED. In ED found to to be in Afib. She was non verbal with left sided weakness and right gaze preference in addition to continuous left facial twitching appearing most consistent with partial complex status epilepticus. She has no prior history of seizure. 1. Twitching resolved after fosphenytoin load. 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  2. Preliminary EEG read after resolution of facial twitching reveals no electrographic seizure.  3. DDx: Focal status, resolved with residual Todd's paralysis, versus subacute stroke not visualized on CT.  4. Elevated BUN/Cr 5. Possible UTI 6. Paroxysmal atrial fibrillation 7. CT head shows chronic right MCA and anterior left frontal lobe strokes, which could serve as seizure foci. The right MCA stroke most likely accounts at least a significant component of her left sided weakness, perhaps more so than Todd's paresis. The left sided old infarct is in Broca's area and likely is associated with some degree of expressive aphasia at baseline.    Recommendations: # Continue Dilantin scheduled at 200 mg IV BID. Dilantin level in AM (ordered) # MRI of the brain without contrast, MRA Head. If negative, no need for further stroke work up # Will eventually need AC if not fall risk, until then PR ASA 300  # BP goal: Modified permissive HTN due to advanced age. Treat if BP > 180/100 mm Hg # HBAIC and Lipid profile # Telemetry monitoring # Frequent neuro checks # NPO until passes stroke swallow screen  I have seen and examined the patient. I have formulated the assessment and recommendations. 85 year old female with acute onset of confusion with left facial twitching. DDx and diagnostic/treatment recommendations as above.  Electronically signed: Dr. Caryl Pina

## 2019-06-01 NOTE — Progress Notes (Signed)
Hypoglycemic Event  CBG: 32  Treatment: dextrose 25mg  Follow-up CBG: Time:2155 CBG Result:156  Possible Reasons for Event: PT NPO on arrival to the unit  Comments/MD notified APP M. A Essien-Akpan

## 2019-06-01 NOTE — ED Notes (Signed)
[name] [name] (son) [phone]

## 2019-06-01 NOTE — ED Notes (Signed)
ED TO INPATIENT HANDOFF REPORT  ED Nurse Name and Phone #: [phone] [name] [name]., RN  S Name/Age/Gender [name] [name] 85 y.o. female Room/Bed: 019C/019C  Code Status   Code Status: Full Code  Home/SNF/Other Home dioriented x4 Is this baseline? No   Triage Complete: Triage complete  Chief Complaint Facial twitching [G51.4] CVA (cerebrovascular accident) [facility]) [I63.9]  Triage Note Pt arrives via EMS from home with LSN 8 pm last night by son. Son found pt sitting upright in floor by her chair, altered, slurred speech and left sided facial droop. Moving all extremities. Son states she is off her blood thinner since Oct/Nov.     Allergies Allergies  Allergen Reactions  . Oxycodone Nausea And Vomiting    Level of Care/Admitting Diagnosis ED Disposition    ED Disposition Condition Comment   Admit  Hospital Area: [facility] [[id number]]  Level of Care: Telemetry Medical [104]  Covid Evaluation: Asymptomatic Screening Protocol (No Symptoms)  Diagnosis: CVA (cerebrovascular accident) [facility]) [[id number]]  Admitting Physician: Jacques Navy [[id number]]  Attending Physician: Illene Regulus E [[id number]]  Estimated length of stay: 5 - 7 days  Certification:: I certify this patient will need inpatient services for at least 2 midnights       B Medical/Surgery History Past Medical History:  Diagnosis Date  . Asthma   . CAD (coronary artery disease)    a. 2000 s/p PCI RCA;  b. 2005 PCI of RCA 2/2 ISR; c. 04/2013 Neg MV;  d. 05/2013 Cath: LM 20, LAD 30p, LCX 20p, OM1 90 small, RCA 43m (PTCA - FFR 0.86), PDA 40.  Marland Kitchen Cerebral infarction (HCC)   . Chronic diastolic CHF (congestive heart failure) (HCC)    a. 10/2013 Echo: EF 60-65%, no rwma, Gr1 DD, mild MR, mildly dil LA.  . CKD (chronic kidney disease), stage III   . DM (diabetes mellitus) (HCC)   . Dyslipidemia   . GERD (gastroesophageal reflux disease)   . Hypertensive heart disease   . Hypothyroidism    hx  . Obesity    . Osteoarthritis   . PAF (paroxysmal atrial fibrillation) (HCC)    a. s/p dccv 04/2014;  b. amio/eliquis;  c. 06/2013 bb/ccb d/c 2/2 symptomatic bradycardia; d. 04/2015 recurrent AF->amio increased/bb resumed.  . Rheumatoid arthritis(714.0)   . Right rib fracture    a. 04/2015.  Marland Kitchen Shoulder fracture, left    a. Dr. Martha Clan   Past Surgical History:  Procedure Laterality Date  . CARDIAC CATHETERIZATION  06/11/2013  . cataract surgery    . CORONARY ANGIOPLASTY    . ELECTROPHYSIOLOGIC STUDY N/A 07/25/2015   Procedure: CARDIOVERSION;  Surgeon: Antonieta Iba, MD;  Location: [facility] ORS;  Service: Cardiovascular;  Laterality: N/A;  . partial hysterectomy    . RCA stent placement  2000  . REPLACEMENT TOTAL KNEE  2013   right  . restenosis with PTCA placement  2005  . SHOULDER SURGERY  02/04/13     A IV Location/Drains/Wounds Patient Lines/Drains/Airways Status   Active Line/Drains/Airways    Name:   Placement date:   Placement time:   Site:   Days:   Peripheral IV 06/01/19 Right Hand   06/01/19    1430    Hand   less than 1   PICC Double Lumen 11/25/15 PICC Left Basilic 42 cm 1 cm   11/25/15    1115     1284   Negative Pressure Wound Therapy Hip Right   --    --    --  Rectal Tube/Pouch   11/18/15    2045    --   1291   External Urinary Catheter   12/05/18    0928    --   178   Pressure Ulcer 11/24/15 Stage I -  Intact skin with non-blanchable redness of a localized area usually over a bony prominence. partial non-blanchable rednish skin over sacrum    11/24/15    1630     1285          Intake/Output Last 24 hours  Intake/Output Summary (Last 24 hours) at 06/01/2019 1952 Last data filed at 06/01/2019 1540 Gross per 24 hour  Intake 80 ml  Output --  Net 80 ml    Labs/Imaging Results for orders placed or performed during the hospital encounter of 06/01/19 (from the past 48 hour(s))  Comprehensive metabolic panel     Status: Abnormal   Collection Time: 06/01/19  2:06 PM   Result Value Ref Range   Sodium 140 135 - 145 mmol/L   Potassium 4.1 3.5 - 5.1 mmol/L   Chloride 101 98 - 111 mmol/L   CO2 26 22 - 32 mmol/L   Glucose, Bld 122 (H) 70 - 99 mg/dL   BUN 44 (H) 8 - 23 mg/dL   Creatinine, Ser 9.92 (H) 0.44 - 1.00 mg/dL   Calcium 9.0 8.9 - 42.6 mg/dL   Total Protein 7.1 6.5 - 8.1 g/dL   Albumin 3.5 3.5 - 5.0 g/dL   AST 25 15 - 41 U/L   ALT 17 0 - 44 U/L   Alkaline Phosphatase 101 38 - 126 U/L   Total Bilirubin 0.3 0.3 - 1.2 mg/dL   GFR calc non Af Amer 19 (L) >60 mL/min   GFR calc Af Amer 22 (L) >60 mL/min   Anion gap 13 5 - 15    Comment: Performed at [facility], [address] [place], [place] [address]  CBC with Differential     Status: Abnormal   Collection Time: 06/01/19  2:06 PM  Result Value Ref Range   WBC 12.3 (H) 4.0 - 10.5 K/uL   RBC 4.56 3.87 - 5.11 MIL/uL   Hemoglobin 13.7 12.0 - 15.0 g/dL   HCT 62.2 29.7 - 98.9 %   MCV 95.0 80.0 - 100.0 fL   MCH 30.0 26.0 - 34.0 pg   MCHC 31.6 30.0 - 36.0 g/dL   RDW 21.1 94.1 - 74.0 %   Platelets 338 150 - 400 K/uL   nRBC 0.0 0.0 - 0.2 %   Neutrophils Relative % 81 %   Neutro Abs 10.0 (H) 1.7 - 7.7 K/uL   Lymphocytes Relative 11 %   Lymphs Abs 1.4 0.7 - 4.0 K/uL   Monocytes Relative 7 %   Monocytes Absolute 0.8 0.1 - 1.0 K/uL   Eosinophils Relative 0 %   Eosinophils Absolute 0.0 0.0 - 0.5 K/uL   Basophils Relative 0 %   Basophils Absolute 0.0 0.0 - 0.1 K/uL   Immature Granulocytes 1 %   Abs Immature Granulocytes 0.07 0.00 - 0.07 K/uL    Comment: Performed at [facility], [address] [place], [place] [address]  Urinalysis, Routine w reflex microscopic     Status: Abnormal   Collection Time: 06/01/19  4:00 PM  Result Value Ref Range   Color, Urine YELLOW YELLOW   APPearance HAZY (A) CLEAR   Specific Gravity, Urine 1.013 1.005 - 1.030   pH 5.0 5.0 - 8.0  Glucose, UA NEGATIVE NEGATIVE mg/dL   Hgb urine dipstick SMALL (A) NEGATIVE   Bilirubin Urine NEGATIVE NEGATIVE    Ketones, ur NEGATIVE NEGATIVE mg/dL   Protein, ur 803 (A) NEGATIVE mg/dL   Nitrite POSITIVE (A) NEGATIVE   Leukocytes,Ua SMALL (A) NEGATIVE   RBC / HPF 0-5 0 - 5 RBC/hpf   WBC, UA 21-50 0 - 5 WBC/hpf   Bacteria, UA MANY (A) NONE SEEN   Squamous Epithelial / LPF 0-5 0 - 5   Mucus PRESENT    Hyaline Casts, UA PRESENT     Comment: Performed at [facility], [address] [place], [place] [address]   CT Head Wo Contrast  Result Date: 06/01/2019 CLINICAL DATA:  Slurred speech left-sided facial droop EXAM: CT HEAD WITHOUT CONTRAST TECHNIQUE: Contiguous axial images were obtained from the base of the skull through the vertex without intravenous contrast. COMPARISON:  CT head 03/06/2019 FINDINGS: Brain: Generalized atrophy. Chronic infarct left frontal lobe and chronic infarct right frontal parietal lobe unchanged. Chronic infarct left thalamus. Negative for acute infarct, hemorrhage, mass. Vascular: Atherosclerotic calcification in the carotid and vertebral arteries. Negative for hyperdense vessel Skull: Negative Sinuses/Orbits: Paranasal sinuses clear.  Bilateral cataract surgery Other: None IMPRESSION: Atrophy and chronic ischemic changes stable from prior study. No acute abnormality Electronically Signed   By: Marlan Palau M.D.   On: 06/01/2019 14:41   DG Hip Unilat With Pelvis 2-3 Views Left  Result Date: 06/01/2019 CLINICAL DATA:  Found by son seated in chair altered with slurred speech and left-sided facial droop EXAM: DG HIP (WITH OR WITHOUT PELVIS) 2-3V LEFT COMPARISON:  Radiograph 12/01/2018 FINDINGS: The osseous structures appear diffusely demineralized which may limit detection of small or nondisplaced fractures. Periprosthetic lucency involving the right hip hemiarthroplasty is similar to comparison radiographs and therefore nonspecific though loosening cannot be fully excluded. Moderate left hip arthrosis. Bilateral SI joint degenerative change as well as additional degenerative  features in the lower lumbar spine. The sacrum is poorly assessed given extensive overlying bowel gas. Vascular calcium noted in the medial thighs and pelvis. IMPRESSION: 1. The osseous structures appear diffusely demineralized which may limit detection of small or nondisplaced fractures. 2. Periprosthetic lucency involving the right hip hemiarthroplasty is similar to comparison radiographs and therefore nonspecific though loosening cannot be fully excluded. Electronically Signed   By: Kreg Shropshire M.D.   On: 06/01/2019 15:56    Pending Labs Unresulted Labs (From admission, onward)    Start     Ordered   06/08/19 0500  Creatinine, serum  (enoxaparin (LOVENOX)    CrCl < 30 ml/min)  Weekly,   R    Comments: while on enoxaparin therapy.    06/01/19 1830   06/01/19 1817  Hemoglobin A1c  Add-on,   AD     06/01/19 1830   06/01/19 1522  SARS CORONAVIRUS 2 (TAT 6-24 HRS) Nasopharyngeal Nasopharyngeal Swab  (Tier 3 (TAT 6-24 hrs))  Once,   STAT    Question Answer Comment  Is this test for diagnosis or screening Screening   Symptomatic for COVID-19 as defined by CDC No   Hospitalized for COVID-19 No   Admitted to ICU for COVID-19 No   Previously tested for COVID-19 Yes   Resident in a congregate (group) care setting No   Employed in healthcare setting No   Pregnant No      06/01/19 1521   06/01/19 1401  Urine culture  ONCE - STAT,   STAT  06/01/19 1400          Vitals/Pain Today's Vitals   06/01/19 1500 06/01/19 1807 06/01/19 1830 06/01/19 1900  BP:  132/80 (!) 154/97 122/64  Pulse:  99 86 84  Resp:  15 14 (!) 24  Temp:      TempSrc:      SpO2:  97% 98% 95%  Weight: 82 kg     PainSc:        Isolation Precautions No active isolations  Medications Medications  aspirin EC tablet 81 mg (81 mg Oral Not Given 06/01/19 1822)  atorvastatin (LIPITOR) tablet 20 mg (has no administration in time range)  nitroGLYCERIN (NITROSTAT) SL tablet 0.4 mg (has no administration in time range)   sertraline (ZOLOFT) tablet 50 mg (50 mg Oral Not Given 06/01/19 1825)  levothyroxine (SYNTHROID) tablet 150 mcg (has no administration in time range)  albuterol (VENTOLIN HFA) 108 (90 Base) MCG/ACT inhaler 2 puff (has no administration in time range)  enoxaparin (LOVENOX) injection 30 mg (has no administration in time range)  sodium chloride flush (NS) 0.9 % injection 3 mL (has no administration in time range)  sodium chloride flush (NS) 0.9 % injection 3 mL (has no administration in time range)  0.9 %  sodium chloride infusion (has no administration in time range)  traMADol (ULTRAM) tablet 50 mg (has no administration in time range)  docusate sodium (COLACE) capsule 100 mg (has no administration in time range)  insulin aspart (novoLOG) injection 0-20 Units (has no administration in time range)  metoprolol tartrate (LOPRESSOR) injection 5 mg (has no administration in time range)  pantoprazole (PROTONIX) injection 40 mg (has no administration in time range)  furosemide (LASIX) injection 20 mg (has no administration in time range)  0.45 % sodium chloride infusion (has no administration in time range)  amiodarone (NEXTERONE PREMIX) 360-4.14 MG/200ML-% (1.8 mg/mL) IV infusion (has no administration in time range)  phenytoin (DILANTIN) 200 mg in sodium chloride 0.9 % 100 mL IVPB (has no administration in time range)  insulin glargine (LANTUS) injection 18 Units (has no administration in time range)  fosPHENYtoin (CEREBYX) 1,500 mg PE in sodium chloride 0.9 % 50 mL IVPB (0 mg PE Intravenous Stopped 06/01/19 1540)  LORazepam (ATIVAN) injection 2 mg (2 mg Intravenous Given 06/01/19 1513)    Mobility non-ambulatory High fall risk   Focused Assessments Neuro Assessment Handoff:  Swallow screen pass? No  Cardiac Rhythm: Atrial fibrillation NIH Stroke Scale ( + Modified Stroke Scale Criteria)  Interval: Initial Level of Consciousness (1a.)   : Alert, keenly responsive LOC Questions (1b. )   +:  Answers neither question correctly LOC Commands (1c. )   + : Performs neither task correctly Best Gaze (2. )  +: Partial gaze palsy Visual (3. )  +: No visual loss Facial Palsy (4. )    : Partial paralysis  Motor Arm, Left (5a. )   +: No drift Motor Arm, Right (5b. )   +: No drift Motor Leg, Left (6a. )   +: No drift Motor Leg, Right (6b. )   +: No drift Limb Ataxia (7. ): Absent Sensory (8. )   +: Normal, no sensory loss Best Language (9. )   +: Mute, global aphasia Dysarthria (10. ): Severe dysarthria, patient's speech is so slurred as to be unintelligible in the absence of or out of proportion to any dysphasia, or is mute/anarthric Extinction/Inattention (11.)   +: No Abnormality Modified SS Total  +: 8 Complete NIHSS TOTAL:  12     Neuro Assessment: Exceptions to WDL Neuro Checks:   Initial (06/01/19 1346)  Last Documented NIHSS Modified Score: 8 (06/01/19 1346) Has TPA been given? No If patient is a Neuro Trauma and patient is going to OR before floor call report to [place] nurse: [phone] or [phone]     R Recommendations: See Admitting Provider Note  Report given to:   Additional Notes:

## 2019-06-02 ENCOUNTER — Inpatient Hospital Stay (HOSPITAL_COMMUNITY): Payer: Medicare Other

## 2019-06-02 ENCOUNTER — Inpatient Hospital Stay: Payer: Self-pay

## 2019-06-02 DIAGNOSIS — G514 Facial myokymia: Secondary | ICD-10-CM

## 2019-06-02 DIAGNOSIS — I4811 Longstanding persistent atrial fibrillation: Secondary | ICD-10-CM

## 2019-06-02 DIAGNOSIS — E039 Hypothyroidism, unspecified: Secondary | ICD-10-CM

## 2019-06-02 LAB — GLUCOSE, CAPILLARY
Glucose-Capillary: 110 mg/dL — ABNORMAL HIGH (ref 70–99)
Glucose-Capillary: 150 mg/dL — ABNORMAL HIGH (ref 70–99)
Glucose-Capillary: 161 mg/dL — ABNORMAL HIGH (ref 70–99)
Glucose-Capillary: 40 mg/dL — CL (ref 70–99)
Glucose-Capillary: 41 mg/dL — CL (ref 70–99)
Glucose-Capillary: 44 mg/dL — CL (ref 70–99)
Glucose-Capillary: 49 mg/dL — ABNORMAL LOW (ref 70–99)
Glucose-Capillary: 55 mg/dL — ABNORMAL LOW (ref 70–99)
Glucose-Capillary: 61 mg/dL — ABNORMAL LOW (ref 70–99)
Glucose-Capillary: 62 mg/dL — ABNORMAL LOW (ref 70–99)
Glucose-Capillary: 73 mg/dL (ref 70–99)
Glucose-Capillary: 77 mg/dL (ref 70–99)
Glucose-Capillary: 97 mg/dL (ref 70–99)

## 2019-06-02 LAB — PHENYTOIN LEVEL, TOTAL: Phenytoin Lvl: 19.2 ug/mL (ref 10.0–20.0)

## 2019-06-02 MED ORDER — SODIUM CHLORIDE 0.9 % IV SOLN
200.0000 mg | Freq: Two times a day (BID) | INTRAVENOUS | Status: DC
  Administered 2019-06-02 – 2019-06-04 (×4): 200 mg via INTRAVENOUS
  Filled 2019-06-02 (×8): qty 4

## 2019-06-02 MED ORDER — DEXTROSE 10 % IV SOLN: INTRAVENOUS | Status: DC

## 2019-06-02 MED ORDER — DEXTROSE 50 % IV SOLN
INTRAVENOUS | Status: AC
  Filled 2019-06-02: qty 50

## 2019-06-02 MED ORDER — SODIUM CHLORIDE 0.9% FLUSH
10.0000 mL | Freq: Two times a day (BID) | INTRAVENOUS | Status: DC
  Administered 2019-06-02 – 2019-06-04 (×2): 10 mL

## 2019-06-02 MED ORDER — LORAZEPAM 2 MG/ML IJ SOLN: 1.0000 mg | Freq: Once | INTRAMUSCULAR | Status: DC

## 2019-06-02 MED ORDER — DEXTROSE 50 % IV SOLN: 25.0000 g | INTRAVENOUS | Status: DC

## 2019-06-02 MED ORDER — ASPIRIN 300 MG RE SUPP
300.0000 mg | Freq: Every day | RECTAL | Status: DC
  Administered 2019-06-02 – 2019-06-03 (×2): 300 mg via RECTAL
  Filled 2019-06-02 (×2): qty 1

## 2019-06-02 MED ORDER — CHLORHEXIDINE GLUCONATE CLOTH 2 % EX PADS
6.0000 | MEDICATED_PAD | Freq: Every day | CUTANEOUS | Status: DC
  Administered 2019-06-03 – 2019-06-04 (×3): 6 via TOPICAL

## 2019-06-02 MED ORDER — DEXTROSE 50 % IV SOLN
12.5000 g | INTRAVENOUS | Status: AC
  Administered 2019-06-02: 12.5 g via INTRAVENOUS
  Filled 2019-06-02: qty 50

## 2019-06-02 MED ORDER — INSULIN ASPART 100 UNIT/ML ~~LOC~~ SOLN: 0.0000 [IU] | Freq: Three times a day (TID) | SUBCUTANEOUS | Status: DC

## 2019-06-02 MED ORDER — DEXTROSE 50 % IV SOLN
25.0000 g | INTRAVENOUS | Status: DC | PRN
  Administered 2019-06-02 – 2019-06-03 (×2): 25 g via INTRAVENOUS
  Filled 2019-06-02: qty 50

## 2019-06-02 MED ORDER — PHENYTOIN SODIUM 50 MG/ML IJ SOLN: 100.0000 mg | Freq: Three times a day (TID) | INTRAMUSCULAR | Status: DC

## 2019-06-02 MED ORDER — SODIUM CHLORIDE 0.9 % IV SOLN
1.0000 g | INTRAVENOUS | Status: AC
  Administered 2019-06-02 – 2019-06-04 (×3): 1 g via INTRAVENOUS
  Filled 2019-06-02 (×3): qty 10

## 2019-06-02 MED ORDER — DEXTROSE-NACL 5-0.45 % IV SOLN: INTRAVENOUS | Status: DC

## 2019-06-02 MED ORDER — LEVOTHYROXINE SODIUM 100 MCG/5ML IV SOLN: 75.0000 ug | Freq: Every day | INTRAVENOUS | Status: DC

## 2019-06-02 MED ORDER — INSULIN ASPART 100 UNIT/ML ~~LOC~~ SOLN: 0.0000 [IU] | Freq: Every day | SUBCUTANEOUS | Status: DC

## 2019-06-02 MED ORDER — METOPROLOL TARTRATE 5 MG/5ML IV SOLN: 5.0000 mg | Freq: Four times a day (QID) | INTRAVENOUS | Status: DC | PRN

## 2019-06-02 MED ORDER — ENOXAPARIN SODIUM 40 MG/0.4ML ~~LOC~~ SOLN
40.0000 mg | SUBCUTANEOUS | Status: DC
  Administered 2019-06-02 – 2019-06-04 (×3): 40 mg via SUBCUTANEOUS
  Filled 2019-06-02 (×3): qty 0.4

## 2019-06-02 MED ORDER — SODIUM CHLORIDE 0.9% FLUSH: 10.0000 mL | INTRAVENOUS | Status: DC | PRN

## 2019-06-02 NOTE — Progress Notes (Signed)
NEUROLOGY PROGRESS NOTE   Subjective: Currently patient is curled in a ball, she is nonverbal other than groaning.  She is reacting to pain.  Overnight she has had multiple episodes of hypoglycemia. 40, 77, 110, 44, 161, 62, 156, 32 have been the last recorded glucose readings.  Exam: Vitals:   06/02/19 0417 06/02/19 0826  BP: 97/68 124/65  Pulse: 66 68  Resp: 15 16  Temp: 97.6 F (36.4 C) 98.3 F (36.8 C)  SpO2: 98% 98%    ROS Unable to obtain as patient is encephalopathic    Physical Exam  Constitutional: Appears well-developed and well-nourished.  Psych: Moaning only to pain Eyes: No scleral injection HENT: No OP obstrucion Head: Normocephalic.  Cardiovascular: Palpable Respiratory: Effort normal, non-labored breathing GI: Soft.  No distension. There is no tenderness.  Skin: WDI   Neuro:  Mental Status: Moaning only to pain Cranial Nerves: II: No blink to threat III,IV, VI: Roving and disconjugate eyes with PERRLA V,VII: Head turned to the right with right facial droop and air leak on the right Motor: Localizing to sternal rub with right greater than left, Withdrawing bilateral legs to noxious stimuli Sensory: Withdraws to noxious stimuli    Medications:  Scheduled: . aspirin EC  81 mg Oral Daily  . atorvastatin  20 mg Oral q1800  . dextrose  12.5 g Intravenous STAT  . dextrose      . docusate sodium  100 mg Oral BID  . enoxaparin (LOVENOX) injection  30 mg Subcutaneous Q24H  . insulin aspart  0-5 Units Subcutaneous QHS  . insulin aspart  0-9 Units Subcutaneous TID WC  . levothyroxine  150 mcg Oral QAC breakfast  . LORazepam  1 mg Intravenous Once  . metoprolol tartrate  5 mg Intravenous Q6H  . pantoprazole (PROTONIX) IV  40 mg Intravenous Q24H  . sertraline  50 mg Oral Daily  . sodium chloride flush  3 mL Intravenous Q12H   Continuous: . sodium chloride    . cefTRIAXone (ROCEPHIN)  IV    . dextrose 5 % and 0.45% NaCl    . phenytoin (DILANTIN) IV  Stopped (06/02/19 0121)    Pertinent Labs/Diagnostics: -Creatinine 2.29 -Fluctuating glucose as above -WBC 12.3 -UA shows positive nitrites many bacteria and small leukocytes  EEG #1: Result Date: 06/02/2019  IMPRESSION: This technically limited study is suggestive of bilateral cortical dysfunction likely secondary to underlying encephalomalacia as well as mild diffuse encephalopathy, nonspecific etiology. No seizures or epileptiform discharges were seen throughout the recording.   EEG #2: This study issuggestive of bilateral cortical dysfunction (right>left)  likely secondary to underlying encephalomalacia as well as mild diffuse encephalopathy, nonspecific etiology. No epileptiform discharges were seen throughout the recording. Multiple episodes of semi-rhythmic left corner of mouth twitching were noted without any EEG change to suggest seizure. However, focal motor seizures may not be seen on scalp eeg. Clinical semiology of the episodes is less likely to be epileptic. Clinical correlation is suggested.   CT Head Wo Contrast Result Date: 06/01/2019  IMPRESSION: Atrophy and chronic ischemic changes stable from prior study. No acute abnormality Electronically Signed   By: Marlan Palau M.D.   On: 06/01/2019 14:41   Felicie Morn PA-C [facility] [phone]  Assessment: 85 year old female found with altered mental status at the house and brought to the ED.  In ED found to be in A. fib.  Initially there was noted to be left facial twitching, which appeared most consistent with partial complex status epilepticus.  1. Initial and follow up EEG showed no electrographic seizure.   2. UA now shows leukocytes and nitrates consistent with UTI.   3. Still awaiting MRI brain and MRA of head.   4. The DDx includes encephalopathy secondary to hypoglycemia and UTI versus seizure with postictal weakness and confusion.  5. CT head reveals a chronic medium-sized ischemic infarction in the  antero-lateral left frontal lobe and a somewhat larger chronic infarct in the right fronto-parietal lobe, both of which are unchanged. The right sided infarct likely accounts for a significant proportion of the patient's weakness. The left sided infarct likely accounts for her aphasia.    6. Paroxysmal atrial fibrillation 7. Dilantin level is therapeutic at 19.2  Impression: -UTI -Partial complex status epilepticus, now resolved.  -Multiple hypoglycemic events -Encephalopathy  Recommendations: -MRI and MRA head when able -Treat UTI --Continue Dilantin scheduled at 200 mg IV BID. Dilantin level in AM (ordered) --Will eventually need AC if not fall risk, until then PR ASA 300   Electronically signed: Dr. Kerney Elbe 06/02/2019, 9:52 AM

## 2019-06-02 NOTE — Progress Notes (Addendum)
SLP Cancellation Note  Patient Details Name: [name] [name] MRN: [id number] DOB: 11-11-1934   Cancelled treatment:       Reason Eval/Treat Not Completed: Patient at procedure or test/unavailable Patient undergoing EEG.   Will re-attempt bedside swallow evaluation as able later this date.   Addendum: Re-attempted BSE at 3:20pm. Patient unavailable, at MRI.   Shella Spearing, M.Ed., CCC-SLP Speech Therapy Acute Rehabilitation [phone]   Shella Spearing 06/02/2019, 11:27 AM

## 2019-06-02 NOTE — Progress Notes (Signed)
   06/02/19 0457  Provider Notification  Provider Name/Title NP M. Katherina Right  Date Provider Notified 06/02/19  Time Provider Notified 0425  Notification Type Page  Notification Reason Other (Comment) (Pt's CBG read 44 at 0420, third hypoglycemic event tonight)  Response No new orders York Spaniel will let the day shift team take care of it)  Date of Provider Response 06/02/19  Time of Provider Response [phone]

## 2019-06-02 NOTE — Evaluation (Signed)
Physical Therapy Evaluation Patient Details Name: [name] [name] MRN: [id number] DOB: Dec 22, 1934 Today's Date: 06/02/2019   History of Present Illness  [name] [name] is a 85 y.o. female with medical history significant of CAD s/p Stent RCA, poorly controlled DM, CKD III, hypothyroidism, PAF w/ h/o cardioversion, h/o CVA July 2020. She was found by her son today on there floor by her chair. He noted marked change in mental status, weakness more on the left, facial twitch and slurred speech. Upon admission, found to be in atrial fibrillation. CT head negative for acute abnormality. EEG negative for seizures. Pt with several noted episodes of hypoglycemia.    Clinical Impression  Pt admitted with above. Pt with poor arousal; nonverbal and unable to follow commands. Became slightly more alert (eyes open) sitting edge of bed, but not visually tracking therapist. Requiring total assist for all aspects of bed mobility. Potentially has increased left upper extremity weakness in comparison to right but difficult to assess due to cognition. Currently recommending SNF at discharge.   Vitals: BP prior to mobility: 124/65 BP sitting edge of bed: 97/64 BP post mobility: 114/66    Follow Up Recommendations SNF;Supervision/Assistance - 24 hour    Equipment Recommendations  Other (comment)(TBA)    Recommendations for Other Services       Precautions / Restrictions Precautions Precautions: Fall Restrictions Weight Bearing Restrictions: No      Mobility  Bed Mobility Overal bed mobility: Needs Assistance Bed Mobility: Sit to Supine;Supine to Sit     Supine to sit: Total assist Sit to supine: Total assist   General bed mobility comments: TotalA for supine <> sit, no initiation noted by pt  Transfers                 General transfer comment: deferred  Ambulation/Gait             General Gait Details: deferred  Stairs            Wheelchair Mobility    Modified  Rankin (Stroke Patients Only)       Balance Overall balance assessment: Needs assistance Sitting-balance support: Feet supported Sitting balance-Leahy Scale: Poor Sitting balance - Comments: requires maxA for sitting balance, minimal balance reactions                                     Pertinent Vitals/Pain Pain Assessment: Faces Faces Pain Scale: No hurt    Home Living Family/patient expects to be discharged to:: Unsure                      Prior Function Level of Independence: Needs assistance   Gait / Transfers Assistance Needed: On 11/2018, pt ambulating 150 feet with RW at a min assist level           Hand Dominance   Dominant Hand: Right    Extremity/Trunk Assessment   Upper Extremity Assessment Upper Extremity Assessment: Defer to OT evaluation    Lower Extremity Assessment Lower Extremity Assessment: Generalized weakness       Communication   Communication: Other (comment)(nonverbal)  Cognition Arousal/Alertness: Lethargic Behavior During Therapy: Flat affect Overall Cognitive Status: Difficult to assess Area of Impairment: Attention;Following commands                   Current Attention Level: Focused   Following Commands: (does not follow simple commands)  General Comments: Pt with poor arousal, eyes open more consistently with sitting EOB, but not visually tracking or following commands. Nonverbal throughout.      General Comments      Exercises     Assessment/Plan    PT Assessment Patient needs continued PT services  PT Problem List Decreased strength;Decreased activity tolerance;Decreased balance;Decreased mobility;Decreased cognition;Decreased safety awareness;Impaired tone       PT Treatment Interventions Gait training;DME instruction;Functional mobility training;Therapeutic activities;Therapeutic exercise;Balance training;Patient/family education    PT Goals (Current goals can be found in  the Care Plan section)  Acute Rehab PT Goals Patient Stated Goal: unable PT Goal Formulation: Patient unable to participate in goal setting Time For Goal Achievement: 06/16/19 Potential to Achieve Goals: Fair    Frequency Min 2X/week   Barriers to discharge        Co-evaluation PT/OT/SLP Co-Evaluation/Treatment: Yes Reason for Co-Treatment: Complexity of the patient's impairments (multi-system involvement);Necessary to address cognition/behavior during functional activity;For patient/therapist safety;To address functional/ADL transfers PT goals addressed during session: Mobility/safety with mobility;Balance         AM-PAC PT "6 Clicks" Mobility  Outcome Measure Help needed turning from your back to your side while in a flat bed without using bedrails?: Total Help needed moving from lying on your back to sitting on the side of a flat bed without using bedrails?: Total Help needed moving to and from a bed to a chair (including a wheelchair)?: Total Help needed standing up from a chair using your arms (e.g., wheelchair or bedside chair)?: Total Help needed to walk in hospital room?: Total Help needed climbing 3-5 steps with a railing? : Total 6 Click Score: 6    End of Session   Activity Tolerance: Patient limited by lethargy Patient left: in bed;with call bell/phone within reach;with bed alarm set Nurse Communication: Mobility status PT Visit Diagnosis: Other abnormalities of gait and mobility (R26.89)    Time: 0272-5366 PT Time Calculation (min) (ACUTE ONLY): 20 min   Charges:   PT Evaluation $PT Eval Moderate Complexity: 1 Mod          Ellamae Sia, PT, DPT Acute Rehabilitation Services Pager [phone] Office [phone]   Willy Eddy 06/02/2019, 10:33 AM

## 2019-06-02 NOTE — Evaluation (Signed)
Occupational Therapy Evaluation Patient Details Name: [name] [name] MRN: [id number] DOB: Aug 11, 1934 Today's Date: 06/02/2019    History of Present Illness [name] [name] is a 85 y.o. female with medical history significant of CAD s/p Stent RCA, poorly controlled DM, CKD III, hypothyroidism, PAF w/ h/o cardioversion, h/o CVA July 2020. She was found by her son today on there floor by her chair. He noted marked change in mental status, weakness more on the left, facial twitch and slurred speech. Upon admission, found to be in atrial fibrillation. CT head negative for acute abnormality. EEG negative for seizures. Pt with several noted episodes of hypoglycemia.     Clinical Impression   Pt presents with poor cognition, level of alertness, generalized weakness and decreased ability to engage in BADL. Unsure of recent PLOF given pt is nonverbal throughout session. She currently requires total A for all bed mobility and BADLs at this point. Pt opening eyes with position change, but not engaging in BADL despite several prompts. Per NT pt CBG at 40 this AM and pt remains NPO. Not currently responding to pain or visual threat. Given current status, recommend SNF at d/c. Will continue to follow per POC listed below.     Follow Up Recommendations  SNF;Supervision/Assistance - 24 hour    Equipment Recommendations  None recommended by OT    Recommendations for Other Services       Precautions / Restrictions Precautions Precautions: Fall Restrictions Weight Bearing Restrictions: No      Mobility Bed Mobility Overal bed mobility: Needs Assistance Bed Mobility: Sit to Supine;Supine to Sit     Supine to sit: Total assist Sit to supine: Total assist   General bed mobility comments: TotalA for supine <> sit, no initiation noted by pt  Transfers                 General transfer comment: deferred due to level of arousal    Balance Overall balance assessment: Needs  assistance Sitting-balance support: Feet supported Sitting balance-Leahy Scale: Poor Sitting balance - Comments: requires maxA for sitting balance, minimal balance reactions                                   ADL either performed or assessed with clinical judgement   ADL Overall ADL's : Needs assistance/impaired Eating/Feeding: NPO                                     General ADL Comments: pt currently total A for all BADL given current level of arousal. Not following simple commands or automatic tasks despite several prompts with BADL items     Vision   Additional Comments: continue to assess, not currently tracking or responding to threat     Perception     Praxis      Pertinent Vitals/Pain Pain Assessment: Faces Faces Pain Scale: No hurt     Hand Dominance Right   Extremity/Trunk Assessment Upper Extremity Assessment Upper Extremity Assessment: Generalized weakness   Lower Extremity Assessment Lower Extremity Assessment: Defer to PT evaluation       Communication Communication Communication: Other (comment)(nonverbal)   Cognition Arousal/Alertness: Lethargic Behavior During Therapy: Flat affect Overall Cognitive Status: Difficult to assess Area of Impairment: Attention;Following commands  Current Attention Level: Focused   Following Commands: (not following any commands)       General Comments: Pt with poor arousal, eyes open more consistently with sitting EOB, but not visually tracking or following commands. Nonverbal throughout. Not responding to pain or noxious stim   General Comments       Exercises     Shoulder Instructions      Home Living Family/patient expects to be discharged to:: Unsure                                 Additional Comments: Unsure of any baseline given pt current cognitive level      Prior Functioning/Environment Level of Independence: Needs assistance   Gait / Transfers Assistance Needed: On 11/2018, pt ambulating 150 feet with RW at a min assist level.              OT Problem List: Decreased strength;Decreased knowledge of use of DME or AE;Decreased activity tolerance;Decreased cognition;Impaired balance (sitting and/or standing);Decreased safety awareness      OT Treatment/Interventions: Self-care/ADL training;Therapeutic exercise;Patient/family education;Neuromuscular education;Balance training;Energy conservation;Therapeutic activities;DME and/or AE instruction;Cognitive remediation/compensation    OT Goals(Current goals can be found in the care plan section) Acute Rehab OT Goals Patient Stated Goal: unable OT Goal Formulation: Patient unable to participate in goal setting Time For Goal Achievement: 06/16/19 Potential to Achieve Goals: Fair  OT Frequency: Min 2X/week   Barriers to D/C:            Co-evaluation PT/OT/SLP Co-Evaluation/Treatment: Yes Reason for Co-Treatment: Complexity of the patient's impairments (multi-system involvement);Necessary to address cognition/behavior during functional activity;For patient/therapist safety;To address functional/ADL transfers PT goals addressed during session: Mobility/safety with mobility;Balance OT goals addressed during session: ADL's and self-care;Strengthening/ROM      AM-PAC OT "6 Clicks" Daily Activity     Outcome Measure Help from another person eating meals?: Total Help from another person taking care of personal grooming?: Total Help from another person toileting, which includes using toliet, bedpan, or urinal?: Total Help from another person bathing (including washing, rinsing, drying)?: Total Help from another person to put on and taking off regular upper body clothing?: Total Help from another person to put on and taking off regular lower body clothing?: Total 6 Click Score: 6   End of Session Nurse Communication: Mobility status  Activity Tolerance: Patient  limited by lethargy Patient left: in bed;with call bell/phone within reach;with bed alarm set  OT Visit Diagnosis: Unsteadiness on feet (R26.81);Other abnormalities of gait and mobility (R26.89);Other symptoms and signs involving cognitive function;Other symptoms and signs involving the nervous system (R29.898);Muscle weakness (generalized) (M62.81)                Time: 1025-8527 OT Time Calculation (min): 19 min Charges:  OT General Charges $OT Visit: 1 Visit OT Evaluation $OT Eval Moderate Complexity: 1 Mod  Dalphine Handing, MSOT, OTR/L Acute Rehabilitation Services [facility] Office Number: [phone]  Dalphine Handing 06/02/2019, 10:56 AM

## 2019-06-02 NOTE — Procedures (Addendum)
Patient Name: [name] [name]  MRN: [id number]  Epilepsy Attending: Charlsie Quest  Referring Physician/Provider: Dr. Caryl Pina Date: 06/01/2019 Duration: 22.03 minutes  Patient history: 85 year old female with chronic right and left MCA infarcts who presented with left-sided facial twitching and left-sided weakness, right gaze preference concerning for seizures.  EEG to evaluate for seizures.  Level of alertness: Awake  AEDs during EEG study: Dilantin  Technical aspects: This EEG study was done with scalp electrodes positioned according to the 10-20 International system of electrode placement. Electrical activity was acquired at a sampling rate of 500Hz  and reviewed with a high frequency filter of 70Hz  and a low frequency filter of 1Hz . EEG data were recorded continuously and digitally stored.   Description: During awake state, no clear posterior dominant rhythm was seen.  EEG showed continuous generalized and lateralized right hemisphere 3 to 6 Hz theta-delta slowing.  Hyperventilation and photic summation were not performed.  Of note, EEG was technically limited due to significant motion artifact.  Abnormality -Continuous slow, generalized and lateralized right hemisphere  IMPRESSION: This technically limited study is suggestive of bilateral cortical dysfunction likely secondary to underlying encephalomalacia as well as mild diffuse encephalopathy, nonspecific etiology. No seizures or epileptiform discharges were seen throughout the recording.

## 2019-06-02 NOTE — Progress Notes (Signed)
Consent obtained from daughter.

## 2019-06-02 NOTE — Progress Notes (Signed)
Peripherally Inserted Central Catheter/Midline Placement  The IV Nurse has discussed with the patient and/or persons authorized to consent for the patient, the purpose of this procedure and the potential benefits and risks involved with this procedure.  The benefits include less needle sticks, lab draws from the catheter, and the patient may be discharged home with the catheter. Risks include, but not limited to, infection, bleeding, blood clot (thrombus formation), and puncture of an artery; nerve damage and irregular heartbeat and possibility to perform a PICC exchange if needed/ordered by physician.  Alternatives to this procedure were also discussed.  Bard Power PICC patient education guide, fact sheet on infection prevention and patient information card has been provided to patient /or left at bedside.  Consent obtained from daughter due to altered mental status.  PICC/Midline Placement Documentation  PICC Double Lumen 11/25/15 PICC Left Basilic 42 cm 1 cm (Active)     PICC Double Lumen 06/02/19 PICC Right Basilic 37 cm 0 cm (Active)  Indication for Insertion or Continuance of Line Poor Vasculature-patient has had multiple peripheral attempts or PIVs lasting less than 24 hours;Limited venous access - need for IV therapy >5 days (PICC only) 06/02/19 2056  Exposed Catheter (cm) 0 cm 06/02/19 2056  Site Assessment Clean;Dry;Intact 06/02/19 2056  Lumen #1 Status Flushed;Saline locked;Blood return noted 06/02/19 2056  Lumen #2 Status Flushed;Saline locked;Blood return noted 06/02/19 2056  Dressing Type Transparent 06/02/19 2056  Dressing Status Clean;Dry;Intact 06/02/19 2056  Dressing Intervention New dressing 06/02/19 2056  Dressing Change Due 06/09/19 06/02/19 2056       Yasmyn Bellisario, Lajean Manes 06/02/2019, 8:57 PM

## 2019-06-02 NOTE — Progress Notes (Signed)
Inpatient Diabetes Program Recommendations  AACE/ADA: New Consensus Statement on Inpatient Glycemic Control (2015)  Target Ranges:  Prepandial:   less than 140 mg/dL      Peak postprandial:   less than 180 mg/dL (1-2 hours)      Critically ill patients:  140 - 180 mg/dL   Lab Results  Component Value Date   GLUCAP 55 (L) 06/02/2019   HGBA1C 9.7 (H) 06/01/2019    Review of Glycemic Control Results for [name] [name], [name] [name] (MRN [id number]) as of 06/02/2019 11:08  Ref. Range 06/02/2019 10:11  Glucose-Capillary Latest Ref Range: 70 - 99 mg/dL 55 (L)   Diabetes history: DM2 Outpatient Diabetes medications: Tresiba 18 units qd + Humalog 2-10 units tid meal coverage Current orders for Inpatient glycemic control: Novolog sensitive correction tid + hs 0-5 units  Inpatient Diabetes Program Recommendations:   Noted hypoglycemia after received lantus 18 units. Evaristo Bury has effect on blood glucose up to 42 hrs so probably overlapped with the basal Lantus. May be able to restart portion of home basal dose tomorrow. Will follow.  Thank you, Billy Fischer. Daltin Crist, RN, MSN, CDE  Diabetes Coordinator Inpatient Glycemic Control Team Team Pager [phone] (8am-5pm) 06/02/2019 11:11 AM

## 2019-06-02 NOTE — Progress Notes (Signed)
Attempted to obtain consent but no answer at listed home number for daughter who has MPOA.  Call placed to son to get cell number as none listed.  [phone] cell number for [name].  Attempted to call with no answer.  Will try again later.  Gasper Lloyd, RN

## 2019-06-02 NOTE — Plan of Care (Signed)
Max assist 

## 2019-06-02 NOTE — Progress Notes (Signed)
Hypoglycemic Event  CBG: 44  Treatment: 50g of dextrose Symptoms: None Follow-up CBG: Time0:508 CBG Result:110  Possible Reasons for Event: Not sure Comments/MD notified:APP M. Arther Abbott A [place]

## 2019-06-02 NOTE — Progress Notes (Signed)
Informed charge RN that PICC is cavoatrial junction and ready to use.  Instructed to removed PIVx2 on right and change tubing prior to attaching to PICC.

## 2019-06-02 NOTE — Progress Notes (Addendum)
Progress Note    [name] [name]  SEG:[id number] DOB: July 28, 1934  DOA: 06/01/2019 PCP: Patient, No Pcp Per    Brief Narrative:     Medical records reviewed and are as summarized below:  [name] [name] is an 85 y.o. female with medical history significant of CAD s/p Stent RCA, poorly controlled DM, CKD III, hypothyroidism, PAF w/ h/o cardioversion, h/o CVA July 2020. She was found by her son today on there floor by her chair. He noted marked change in mental status, weakness more on the left, facial twitch and slurred speech. She was brought to [facility]- for evaluation. Patient cannot give a history  Assessment/Plan:   Active Problems:   CAD (coronary artery disease)   Atrial fibrillation (HCC)   Hypothyroidism   Hypertension   Poorly controlled type 2 diabetes mellitus with circulatory disorder (HCC)   Facial twitching   Slurred speech   CVA (cerebrovascular accident) ([place])   Acute metabolic encephalopathy -MS changes, right facial twitch. At admission exam agitated, restless yet stiff.  -CT head negative.  - differential: Sz vs new CVA - neurology consult: patient loaded with fosphenytoin. EEG ordered but unrevealing- LTM EEG Now - MRI/MRA ordered but patient too restless for study - NPO  - PT/OT eval  A. Fib - h/o PAF  -s/p cardioversion in 2015 per chart -was not on anticoagulation prior to this hospitalization?? -in July was d/c'd with warfarin bridge/lovenox   DM with hypoglycemia -d/c lantus -d5 IVF  -check blood sugars q 1-2 hours  HTN -will allow for permissive HTN -appears to have orthostatic issues with PT today   Overall poor prognosis- -will change to progressive level of care--- spoke with son- his sister is MPOA for [place] -able to do own adls prior to admission, lived on land with son in her own dwelling  - spoke with daughter who is her MPOA-- she would not want to be intubated nor have CPR but would wand treatment as we are doing   Family  Communication/Anticipated D/C date and plan/Code Status   DVT prophylaxis: Lovenox ordered. Code Status: dnr Family Communication: spoke with son/and daughter  Disposition Plan: poor overall prognosis   Medical Consultants:    Neurology  Subjective:   Had several episodes of hypoglycemia overnight  Objective:    Vitals:   06/01/19 2108 06/02/19 0031 06/02/19 0417 06/02/19 0826  BP:  (!) 96/59 97/68 124/65  Pulse:  (!) 50 66 68  Resp:  (!) 22 15 16   Temp:   97.6 F (36.4 C) 98.3 F (36.8 C)  TempSrc:   Oral Oral  SpO2:  99% 98% 98%  Weight: 69.2 kg       Intake/Output Summary (Last 24 hours) at 06/02/2019 1057 Last data filed at 06/02/2019 0900 Gross per 24 hour  Intake 1067.72 ml  Output 550 ml  Net 517.72 ml   Filed Weights   06/01/19 1500 06/01/19 2108  Weight: 82 kg 69.2 kg    Exam: In bed, responds to pain irr but rate controlled-- tele shows low rate at some times +BS, soft, NT Not following commands but will localize to painful stimuli  Data Reviewed:   I have personally reviewed following labs and imaging studies:  Labs: Labs show the following:   Basic Metabolic Panel: Recent Labs  Lab 06/01/19 1406  NA 140  K 4.1  CL 101  CO2 26  GLUCOSE 122*  BUN 44*  CREATININE 2.29*  CALCIUM 9.0   GFR CrCl  cannot be calculated (Unknown ideal weight.). Liver Function Tests: Recent Labs  Lab 06/01/19 1406  AST 25  ALT 17  ALKPHOS 101  BILITOT 0.3  PROT 7.1  ALBUMIN 3.5   No results for input(s): LIPASE, AMYLASE in the last 168 hours. No results for input(s): AMMONIA in the last 168 hours. Coagulation profile No results for input(s): INR, PROTIME in the last 168 hours.  CBC: Recent Labs  Lab 06/01/19 1406  WBC 12.3*  NEUTROABS 10.0*  HGB 13.7  HCT 43.3  MCV 95.0  PLT 338   Cardiac Enzymes: No results for input(s): CKTOTAL, CKMB, CKMBINDEX, TROPONINI in the last 168 hours. BNP (last 3 results) No results for input(s): PROBNP  in the last 8760 hours. CBG: Recent Labs  Lab 06/02/19 0420 06/02/19 0508 06/02/19 0614 06/02/19 0822 06/02/19 1011  GLUCAP 44* 110* 77 40* 55*   D-Dimer: No results for input(s): DDIMER in the last 72 hours. Hgb A1c: Recent Labs    06/01/19 1418  HGBA1C 9.7*   Lipid Profile: No results for input(s): CHOL, HDL, LDLCALC, TRIG, CHOLHDL, LDLDIRECT in the last 72 hours. Thyroid function studies: No results for input(s): TSH, T4TOTAL, T3FREE, THYROIDAB in the last 72 hours.  Invalid input(s): FREET3 Anemia work up: No results for input(s): VITAMINB12, FOLATE, FERRITIN, TIBC, IRON, RETICCTPCT in the last 72 hours. Sepsis Labs: Recent Labs  Lab 06/01/19 1406  WBC 12.3*    Microbiology Recent Results (from the past 240 hour(s))  SARS CORONAVIRUS 2 (TAT 6-24 HRS) Nasopharyngeal Nasopharyngeal Swab     Status: None   Collection Time: 06/01/19  3:22 PM   Specimen: Nasopharyngeal Swab  Result Value Ref Range Status   SARS Coronavirus 2 NEGATIVE NEGATIVE Final    Comment: (NOTE) SARS-CoV-2 target nucleic acids are NOT DETECTED. The SARS-CoV-2 RNA is generally detectable in upper and lower respiratory specimens during the acute phase of infection. Negative results do not preclude SARS-CoV-2 infection, do not rule out co-infections with other pathogens, and should not be used as the sole basis for treatment or other patient management decisions. Negative results must be combined with clinical observations, patient history, and epidemiological information. The expected result is Negative. Fact Sheet for Patients: HairSlick.no Fact Sheet for Healthcare Providers: quierodirigir.com This test is not yet approved or cleared by the [place] FDA and  has been authorized for detection and/or diagnosis of SARS-CoV-2 by FDA under an Emergency Use Authorization (EUA). This EUA will remain  in effect (meaning this test can be  used) for the duration of the COVID-19 declaration under Section 56 4(b)(1) of the Act, 21 U.S.C. section 360bbb-3(b)(1), unless the authorization is terminated or revoked sooner. Performed at [facility], [address] [place], [place] [address]     Procedures and diagnostic studies:  EEG  Result Date: 06/02/2019 [name] Quest, MD     06/02/2019  8:24 AM Patient Name: [name] [name] MRN: [id number] Epilepsy Attending: Charlsie [name] Referring Physician/Provider: Dr. Caryl Pina Date: 06/19/2019 Duration: 22.03 minutes Patient history: 85 year old female with chronic right and left MCA infarcts who presented with left-sided facial twitching and left-sided weakness, right gaze preference concerning for seizures.  EEG to evaluate for seizures. Level of alertness: Awake AEDs during EEG study: Dilantin Technical aspects: This EEG study was done with scalp electrodes positioned according to the 10-20 International system of electrode placement. Electrical activity was acquired at a sampling rate of 500Hz  and reviewed with a high frequency filter of 70Hz  and  a low frequency filter of 1Hz . EEG data were recorded continuously and digitally stored. Description: During awake state, no clear posterior dominant rhythm was seen.  EEG showed continuous generalized and lateralized right hemisphere 3 to 6 Hz theta-delta slowing.  Hyperventilation and photic summation were not performed. Of note, EEG was technically limited due to significant motion artifact. Abnormality -Continuous slow, generalized and lateralized right hemisphere IMPRESSION: This technically limited study is suggestive of bilateral cortical dysfunction likely secondary to underlying encephalomalacia as well as mild diffuse encephalopathy, nonspecific etiology. No seizures or epileptiform discharges were seen throughout the recording.   CT Head Wo Contrast  Result Date: 06/01/2019 CLINICAL DATA:  Slurred speech left-sided facial  droop EXAM: CT HEAD WITHOUT CONTRAST TECHNIQUE: Contiguous axial images were obtained from the base of the skull through the vertex without intravenous contrast. COMPARISON:  CT head 03/06/2019 FINDINGS: Brain: Generalized atrophy. Chronic infarct left frontal lobe and chronic infarct right frontal parietal lobe unchanged. Chronic infarct left thalamus. Negative for acute infarct, hemorrhage, mass. Vascular: Atherosclerotic calcification in the carotid and vertebral arteries. Negative for hyperdense vessel Skull: Negative Sinuses/Orbits: Paranasal sinuses clear.  Bilateral cataract surgery Other: None IMPRESSION: Atrophy and chronic ischemic changes stable from prior study. No acute abnormality Electronically Signed   By: 03/08/2019 M.D.   On: 06/01/2019 14:41   DG Hip Unilat With Pelvis 2-3 Views Left  Result Date: 06/01/2019 CLINICAL DATA:  Found by son seated in chair altered with slurred speech and left-sided facial droop EXAM: DG HIP (WITH OR WITHOUT PELVIS) 2-3V LEFT COMPARISON:  Radiograph 12/01/2018 FINDINGS: The osseous structures appear diffusely demineralized which may limit detection of small or nondisplaced fractures. Periprosthetic lucency involving the right hip hemiarthroplasty is similar to comparison radiographs and therefore nonspecific though loosening cannot be fully excluded. Moderate left hip arthrosis. Bilateral SI joint degenerative change as well as additional degenerative features in the lower lumbar spine. The sacrum is poorly assessed given extensive overlying bowel gas. Vascular calcium noted in the medial thighs and pelvis. IMPRESSION: 1. The osseous structures appear diffusely demineralized which may limit detection of small or nondisplaced fractures. 2. Periprosthetic lucency involving the right hip hemiarthroplasty is similar to comparison radiographs and therefore nonspecific though loosening cannot be fully excluded. Electronically Signed   By: 12/03/2018 M.D.   On:  06/01/2019 15:56    Medications:   . aspirin EC  81 mg Oral Daily  . atorvastatin  20 mg Oral q1800  . dextrose  12.5 g Intravenous STAT  . dextrose      . docusate sodium  100 mg Oral BID  . enoxaparin (LOVENOX) injection  30 mg Subcutaneous Q24H  . insulin aspart  0-5 Units Subcutaneous QHS  . insulin aspart  0-9 Units Subcutaneous TID WC  . levothyroxine  150 mcg Oral QAC breakfast  . LORazepam  1 mg Intravenous Once  . pantoprazole (PROTONIX) IV  40 mg Intravenous Q24H  . sertraline  50 mg Oral Daily  . sodium chloride flush  3 mL Intravenous Q12H   Continuous Infusions: . sodium chloride    . cefTRIAXone (ROCEPHIN)  IV    . dextrose 5 % and 0.45% NaCl    . phenytoin (DILANTIN) IV Stopped (06/02/19 0121)     LOS: 1 day   06/04/19  [facility]   How to contact the [facility] Attending or Consulting provider 7A - 7P or covering provider during after hours 7P -7A, for this patient?  1. Check the  care team in [facility] and look for a) attending/consulting [facility] provider listed and b) the [facility] team listed 2. Log into [email or web address] and use [facility]'s universal password to access. If you do not have the password, please contact the hospital operator. 3. Locate the [facility] provider you are looking for under [facility] and page to a number that you can be directly reached. 4. If you still have difficulty reaching the provider, please page the [facility] (Director on Call) for the Hospitalists listed on amion for assistance.  06/02/2019, 10:57 AM

## 2019-06-02 NOTE — Progress Notes (Signed)
Technically difficult hookup. Pt has very matted hair making placement difficult. EEG completed, results pending

## 2019-06-02 NOTE — Procedures (Addendum)
Patient Name: [name] [name]  MRN: [id number]  Epilepsy Attending: Charlsie Quest  Referring Physician/Provider: Dr. Caryl Pina Date: 06/02/2019 Duration: 26.18 mins  Patient history: 85 year old female with chronic right and left MCA infarcts who presented with left-sided facial twitching and left-sided weakness, right gaze preference concerning for seizures which resolved after AED.  Now having frequent facial twitching in setting of hypoglycemia. EEG to evaluate for seizures.  Level of alertness: Awake  AEDs during EEG study: Dilantin  Technical aspects: This EEG study was done with scalp electrodes positioned according to the 10-20 International system of electrode placement. Electrical activity was acquired at a sampling rate of 500Hz  and reviewed with a high frequency filter of 70Hz  and a low frequency filter of 1Hz . EEG data were recorded continuously and digitally stored.   Description: During awake state, no clear posterior dominant rhythm was seen.  EEG showed continuous generalized and lateralized right hemisphere 3 to 6 Hz theta-delta slowing.  Hyperventilation and photic summation were not performed.  Multiple episodes of semi-rhythmic left corner of mouth twitching were noted during which patient was alert and oriented. Concomitant eeg before during and after the episodes didn't show any change to suggest seizure.  Abnormality -Continuous slow, generalized and lateralized right hemisphere  IMPRESSION: This study is suggestive of bilateral cortical dysfunction ( right>left)  likely secondary to underlying encephalomalacia as well as mild diffuse encephalopathy, nonspecific etiology. No epileptiform discharges were seen throughout the recording.  Multiple episodes of semi-rhythmic left corner of mouth twitching were noted without any EEG change to suggest seizure. However, focal motor seizures may not be seen on scalp eeg. Clinical semiology of the episodes is less  likely to be epileptic. Clinical correlation is suggested.   Breyah Akhter 

## 2019-06-02 NOTE — Progress Notes (Signed)
Hypoglycemic Event  CBG: 62  Treatment: dextrose 12.5g Symptoms: None Follow-up CBG: Time:0142 CBG Result:161  Possible Reasons for Event: Pt NPO, 18 units of LANTUS given @ 2245 Comments/MD notified:APP M. Arther Abbott A [place]

## 2019-06-03 ENCOUNTER — Inpatient Hospital Stay (HOSPITAL_COMMUNITY): Payer: Medicare Other

## 2019-06-03 DIAGNOSIS — I361 Nonrheumatic tricuspid (valve) insufficiency: Secondary | ICD-10-CM

## 2019-06-03 DIAGNOSIS — I340 Nonrheumatic mitral (valve) insufficiency: Secondary | ICD-10-CM

## 2019-06-03 DIAGNOSIS — I639 Cerebral infarction, unspecified: Principal | ICD-10-CM

## 2019-06-03 DIAGNOSIS — I480 Paroxysmal atrial fibrillation: Secondary | ICD-10-CM

## 2019-06-03 LAB — CBC
HCT: 35.9 % — ABNORMAL LOW (ref 36.0–46.0)
Hemoglobin: 11.6 g/dL — ABNORMAL LOW (ref 12.0–15.0)
MCH: 30.3 pg (ref 26.0–34.0)
MCHC: 32.3 g/dL (ref 30.0–36.0)
MCV: 93.7 fL (ref 80.0–100.0)
Platelets: 252 10*3/uL (ref 150–400)
RBC: 3.83 MIL/uL — ABNORMAL LOW (ref 3.87–5.11)
RDW: 13.3 % (ref 11.5–15.5)
WBC: 7.1 10*3/uL (ref 4.0–10.5)
nRBC: 0 % (ref 0.0–0.2)

## 2019-06-03 LAB — GLUCOSE, CAPILLARY
Glucose-Capillary: 135 mg/dL — ABNORMAL HIGH (ref 70–99)
Glucose-Capillary: 144 mg/dL — ABNORMAL HIGH (ref 70–99)
Glucose-Capillary: 149 mg/dL — ABNORMAL HIGH (ref 70–99)
Glucose-Capillary: 155 mg/dL — ABNORMAL HIGH (ref 70–99)
Glucose-Capillary: 158 mg/dL — ABNORMAL HIGH (ref 70–99)
Glucose-Capillary: 165 mg/dL — ABNORMAL HIGH (ref 70–99)
Glucose-Capillary: 165 mg/dL — ABNORMAL HIGH (ref 70–99)
Glucose-Capillary: 170 mg/dL — ABNORMAL HIGH (ref 70–99)
Glucose-Capillary: 212 mg/dL — ABNORMAL HIGH (ref 70–99)
Glucose-Capillary: 216 mg/dL — ABNORMAL HIGH (ref 70–99)
Glucose-Capillary: 220 mg/dL — ABNORMAL HIGH (ref 70–99)
Glucose-Capillary: 224 mg/dL — ABNORMAL HIGH (ref 70–99)
Glucose-Capillary: 278 mg/dL — ABNORMAL HIGH (ref 70–99)
Glucose-Capillary: 283 mg/dL — ABNORMAL HIGH (ref 70–99)
Glucose-Capillary: 296 mg/dL — ABNORMAL HIGH (ref 70–99)
Glucose-Capillary: 48 mg/dL — ABNORMAL LOW (ref 70–99)
Glucose-Capillary: 84 mg/dL (ref 70–99)

## 2019-06-03 LAB — BASIC METABOLIC PANEL
Anion gap: 11 (ref 5–15)
BUN: 33 mg/dL — ABNORMAL HIGH (ref 8–23)
CO2: 27 mmol/L (ref 22–32)
Calcium: 8 mg/dL — ABNORMAL LOW (ref 8.9–10.3)
Chloride: 103 mmol/L (ref 98–111)
Creatinine, Ser: 1.69 mg/dL — ABNORMAL HIGH (ref 0.44–1.00)
GFR calc Af Amer: 32 mL/min — ABNORMAL LOW (ref >60)
GFR calc non Af Amer: 28 mL/min — ABNORMAL LOW (ref >60)
Glucose, Bld: 69 mg/dL — ABNORMAL LOW (ref 70–99)
Potassium: 3.4 mmol/L — ABNORMAL LOW (ref 3.5–5.1)
Sodium: 141 mmol/L (ref 135–145)

## 2019-06-03 LAB — LIPID PANEL
Cholesterol: 175 mg/dL (ref 0–200)
HDL: 40 mg/dL — ABNORMAL LOW (ref >40)
LDL Cholesterol: 112 mg/dL — ABNORMAL HIGH (ref 0–99)
Total CHOL/HDL Ratio: 4.4 RATIO
Triglycerides: 114 mg/dL (ref <150)
VLDL: 23 mg/dL (ref 0–40)

## 2019-06-03 LAB — ECHOCARDIOGRAM COMPLETE: Weight: 2440.93 oz

## 2019-06-03 MED ORDER — MUSCLE RUB 10-15 % EX CREA
TOPICAL_CREAM | CUTANEOUS | Status: DC | PRN
  Administered 2019-06-03 – 2019-06-04 (×2): 1 via TOPICAL
  Filled 2019-06-03: qty 85

## 2019-06-03 NOTE — Evaluation (Addendum)
Clinical/Bedside Swallow Evaluation Patient Details  Name: [name] [name] MRN: [id number] Date of Birth: 03/11/1935  Today's Date: 06/03/2019 Time: SLP Start Time (ACUTE ONLY): 1115 SLP Stop Time (ACUTE ONLY): 1137 SLP Time Calculation (min) (ACUTE ONLY): 22 min  Past Medical History:  Past Medical History:  Diagnosis Date  . Asthma   . CAD (coronary artery disease)    a. 2000 s/p PCI RCA;  b. 2005 PCI of RCA 2/2 ISR; c. 04/2013 Neg MV;  d. 05/2013 Cath: LM 20, LAD 30p, LCX 20p, OM1 90 small, RCA 41m (PTCA - FFR 0.86), PDA 40.  Marland Kitchen Cerebral infarction ([place])   . Chronic diastolic CHF (congestive heart failure) ([place])    a. 10/2013 Echo: EF 60-65%, no rwma, Gr1 DD, mild MR, mildly dil LA.  . CKD (chronic kidney disease), stage III   . DM (diabetes mellitus) ([place])   . Dyslipidemia   . GERD (gastroesophageal reflux disease)   . Hypertensive heart disease   . Hypothyroidism    hx  . Obesity   . Osteoarthritis   . PAF (paroxysmal atrial fibrillation) ([place])    a. s/p dccv 04/2014;  b. amio/eliquis;  c. 06/2013 bb/ccb d/c 2/2 symptomatic bradycardia; d. 04/2015 recurrent AF->amio increased/bb resumed.  . Rheumatoid arthritis(714.0)   . Right rib fracture    a. 04/2015.  Marland Kitchen Shoulder fracture, left    a. Dr. Mack Guise   Past Surgical History:  Past Surgical History:  Procedure Laterality Date  . CARDIAC CATHETERIZATION  06/11/2013  . cataract surgery    . CORONARY ANGIOPLASTY    . ELECTROPHYSIOLOGIC STUDY N/A 07/25/2015   Procedure: CARDIOVERSION;  Surgeon: Minna Merritts, MD;  Location: [facility] ORS;  Service: Cardiovascular;  Laterality: N/A;  . partial hysterectomy    . RCA stent placement  2000  . REPLACEMENT TOTAL KNEE  2013   right  . restenosis with PTCA placement  2005  . SHOULDER SURGERY  02/04/13   HPI:  85yo female admitted 06/01/19 with AMS, dysarthria, facial twitch, left weakness. Pt found on the floor by her son PMH: CAD s/p stent RCA, DM, CKD3, hypothyroidism, PAF,  cardioversion, CVA (11/2018). MRI =Subcentimeter acute infarct right superior cerebellum Atrophy and extensive chronic ischemic changes above.   Assessment / Plan / Recommendation Clinical Impression  Pt presents with confusion. She was seen by ST services in July 2020, and advanced to Dys 3 diet with thin liquids. Today, pt is alert and cooperative, but has difficulty following commands consistently. She is edentulous upper, and has natural teeth lower. Pt accepted trials of ice chips, thin liquid, puree, soft solid and dry solid textures. Pt exhibited discoordinated bolus prep of dry solid, with diffuse oral residue noted. Other textures were tolerated well without significant oral issues or overt s/s aspiration. Will begin dys 2 (finely chopped) diet with thin liquids, crushed meds. Safe swallow precautions posted at [facility], and RN was informed. SLP will follow to assess diet tolerance and appropriateness for advanced textures.    SLP Visit Diagnosis: Dysphagia, unspecified (R13.10)    Aspiration Risk  Mild aspiration risk    Diet Recommendation Dysphagia 2 (Fine chop);Thin liquid   Liquid Administration via: Cup;Straw Medication Administration: Crushed with puree Supervision: Staff to assist with self feeding;Full supervision/cueing for compensatory strategies Compensations: Minimize environmental distractions;Slow rate;Small sips/bites Postural Changes: Seated upright at 90 degrees;Remain upright for at least 30 minutes after po intake    Other  Recommendations Oral Care Recommendations: Oral care QID   Follow up  Recommendations 24 hour supervision/assistance;Skilled Nursing facility      Frequency and Duration min 1 x/week  1 week;2 weeks       Prognosis Prognosis for Safe Diet Advancement: Fair Barriers to Reach Goals: Cognitive deficits      Swallow Study   General Date of Onset: 06/01/19 HPI: 85yo female admitted 06/01/19 with AMS, dysarthria, facial twitch, left weakness. Pt  found on the floor by her son PMH: CAD s/p stent RCA, DM, CKD3, hypothyroidism, PAF, cardioversion, CVA (11/2018). MRI =Subcentimeter acute infarct right superior cerebellum Atrophy and extensive chronic ischemic changes above. Type of Study: Bedside Swallow Evaluation Previous Swallow Assessment: BSE/MBS in July 2020 - advanced to Dys3/thin Diet Prior to this Study: NPO Temperature Spikes Noted: No Respiratory Status: Room air History of Recent Intubation: No Behavior/Cognition: Alert;Cooperative Oral Cavity Assessment: Dry Oral Care Completed by SLP: No Oral Cavity - Dentition: Missing dentition(edentulous upper) Self-Feeding Abilities: Needs assist Patient Positioning: Upright in bed Baseline Vocal Quality: Normal Volitional Cough: Cognitively unable to elicit Volitional Swallow: Unable to elicit    Oral/Motor/Sensory Function Overall Oral Motor/Sensory Function: Within functional limits   Ice Chips Ice chips: Within functional limits Presentation: Spoon   Thin Liquid Thin Liquid: Within functional limits Presentation: Straw;Cup    Nectar Thick Nectar Thick Liquid: Not tested   Honey Thick Honey Thick Liquid: Not tested   Puree Puree: Within functional limits Presentation: Spoon   Solid     Solid: Impaired Oral Phase Impairments: Poor awareness of bolus;Impaired mastication Oral Phase Functional Implications: Oral residue;Impaired mastication;Prolonged oral transit Pharyngeal Phase Impairments: Suspected delayed Olive Bass, MSP, CCC-SLP Speech Language Pathologist Office: [phone] Pager: [phone]  Leigh Aurora 06/03/2019,12:34 PM

## 2019-06-03 NOTE — Progress Notes (Signed)
  Echocardiogram 2D Echocardiogram has been performed.  [name] 06/03/2019, 1:18 PM

## 2019-06-03 NOTE — Progress Notes (Signed)
Patient ID: [name] [name], female   DOB: [birth year], 85 y.o.   MRN: [id number]  PROGRESS NOTE    [name] [name]  GLO:[id number] DOB: 1934-08-11 DOA: 06/01/2019 PCP: Galvin Proffer, MD   Brief Narrative:  85 year old female with history of CAD status post stent to RCA, poorly controlled diabetes mellitus type 2, chronic renal disease stage III, hypothyroidism, paroxysmal A. fib with history of cardioversion, unspecified CVA in July 2020 presented with altered mental status, weakness more on the left side with facial twitching and slurred speech.  CT of the head was negative for any acute intracranial abnormality.  Neurology was consulted.  Assessment & Plan:   Acute right cerebellar infarct Acute metabolic encephalopathy -Presented with altered mental status, weakness more on the left side with facial twitching and slurred speech -CT of the head was negative for acute infarct.  MRI of the brain showed acute right cerebellar infarct. -Neurology following.  Initially there was a concern for seizure as well.  Patient was loaded with fosphenytoin.  EEG did not show any epileptiform discharges. -Continue monitoring mental status.  Currently awake but hardly answers any questions -PT/OT recommend SNF placement. -SLP eval -A1c 9.7.  LDL 112. -Continue aspirin and statin  paroxysmal atrial fibrillation -Status post cardioversion in 2015 per chart -Was not on anticoagulation prior to this hospitalization for unknown reason -Previously was on Eliquis but did not take it because of cost.  Subsequently, during her last hospitalization in July thousand 20, she was discharged on Coumadin and Lovenox. -Might need lifelong anticoagulation.  Acute kidney injury on chronic kidney disease stage IIIb -Baseline creatinine around 1.4-1.5 -Presented with creatinine of 2.29.  Improving to 1.69 today.  Monitor  Diabetes mellitus type 2 with hypoglycemia -Continue CBGs.  Avoid insulin for now.  Still has  episodes of intermittent hypoglycemia.  A1c 9.7.  Hypertension -Monitor.  Allow for permissive hypertension  Generalized deconditioning -Overall prognosis is guarded to poor.  Will request palliative care conversation for goals of care discussion  DVT prophylaxis: Lovenox Code Status: DNR Family Communication: None at bedside Disposition Plan: SNF once clinically improves  Consultants: Neurology  Procedures:  EEG  Antimicrobials: None   Subjective: Patient seen and examined at bedside.  She is awake but hardly able to answer any questions.  No overnight fever, vomiting, seizures reported by nursing staff.  Objective: Vitals:   06/02/19 2010 06/02/19 2337 06/03/19 0441 06/03/19 0824  BP: 124/69 (!) 140/53 (!) 125/52 (!) 121/57  Pulse: 60 64 (!) 46 65  Resp: 13 15 15 15   Temp: 97.6 F (36.4 C) (!) 97.5 F (36.4 C) 98.2 F (36.8 C) 98.2 F (36.8 C)  TempSrc: Oral Axillary Oral Axillary  SpO2: 100% 100%  100%  Weight:        Intake/Output Summary (Last 24 hours) at 06/03/2019 1101 Last data filed at 06/03/2019 0500 Gross per 24 hour  Intake 540.16 ml  Output 150 ml  Net 390.16 ml   Filed Weights   06/01/19 1500 06/01/19 2108  Weight: 82 kg 69.2 kg    Examination:  General exam: Elderly female lying in bed.  Looks chronically ill.  Awake but hardly able to answer any questions Respiratory system: Bilateral decreased breath sounds at bases Cardiovascular system: S1 & S2 heard, intermittently bradycardic Gastrointestinal system: Abdomen is nondistended, soft and nontender. Normal bowel sounds heard. Extremities: No cyanosis, clubbing, edema     Data Reviewed: I have personally reviewed following labs and imaging studies  CBC: Recent  Labs  Lab 06/01/19 1406 06/03/19 0337  WBC 12.3* 7.1  NEUTROABS 10.0*  --   HGB 13.7 11.6*  HCT 43.3 35.9*  MCV 95.0 93.7  PLT 338 252   Basic Metabolic Panel: Recent Labs  Lab 06/01/19 1406 06/03/19 0337  NA 140 141   K 4.1 3.4*  CL 101 103  CO2 26 27  GLUCOSE 122* 69*  BUN 44* 33*  CREATININE 2.29* 1.69*  CALCIUM 9.0 8.0*   GFR: CrCl cannot be calculated (Unknown ideal weight.). Liver Function Tests: Recent Labs  Lab 06/01/19 1406  AST 25  ALT 17  ALKPHOS 101  BILITOT 0.3  PROT 7.1  ALBUMIN 3.5   No results for input(s): LIPASE, AMYLASE in the last 168 hours. No results for input(s): AMMONIA in the last 168 hours. Coagulation Profile: No results for input(s): INR, PROTIME in the last 168 hours. Cardiac Enzymes: No results for input(s): CKTOTAL, CKMB, CKMBINDEX, TROPONINI in the last 168 hours. BNP (last 3 results) No results for input(s): PROBNP in the last 8760 hours. HbA1C: Recent Labs    06/01/19 1418  HGBA1C 9.7*   CBG: Recent Labs  Lab 06/03/19 0637 06/03/19 0728 06/03/19 0840 06/03/19 0941 06/03/19 1034  GLUCAP 135* 144* 155* 158* 165*   Lipid Profile: Recent Labs    06/03/19 0337  CHOL 175  HDL 40*  LDLCALC 112*  TRIG 114  CHOLHDL 4.4   Thyroid Function Tests: No results for input(s): TSH, T4TOTAL, FREET4, T3FREE, THYROIDAB in the last 72 hours. Anemia Panel: No results for input(s): VITAMINB12, FOLATE, FERRITIN, TIBC, IRON, RETICCTPCT in the last 72 hours. Sepsis Labs: No results for input(s): PROCALCITON, LATICACIDVEN in the last 168 hours.  Recent Results (from the past 240 hour(s))  SARS CORONAVIRUS 2 (TAT 6-24 HRS) Nasopharyngeal Nasopharyngeal Swab     Status: None   Collection Time: 06/01/19  3:22 PM   Specimen: Nasopharyngeal Swab  Result Value Ref Range Status   SARS Coronavirus 2 NEGATIVE NEGATIVE Final    Comment: (NOTE) SARS-CoV-2 target nucleic acids are NOT DETECTED. The SARS-CoV-2 RNA is generally detectable in upper and lower respiratory specimens during the acute phase of infection. Negative results do not preclude SARS-CoV-2 infection, do not rule out co-infections with other pathogens, and should not be used as the sole basis  for treatment or other patient management decisions. Negative results must be combined with clinical observations, patient history, and epidemiological information. The expected result is Negative. Fact Sheet for Patients: HairSlick.no Fact Sheet for Healthcare Providers: quierodirigir.com This test is not yet approved or cleared by the [place] FDA and  has been authorized for detection and/or diagnosis of SARS-CoV-2 by FDA under an Emergency Use Authorization (EUA). This EUA will remain  in effect (meaning this test can be used) for the duration of the COVID-19 declaration under Section 56 4(b)(1) of the Act, 21 U.S.C. section 360bbb-3(b)(1), unless the authorization is terminated or revoked sooner. Performed at [facility], [address], [place], [place] [address]          Radiology Studies: EEG  Result Date: 06/02/2019 Charlsie Quest, MD     06/02/2019 12:19 PM Patient Name: KAMAIA QUIOCHO MRN: [id number] Epilepsy Attending: Charlsie Quest Referring Physician/Provider: Dr. Caryl Pina Date: 06/02/2019 Duration: 26.18 mins  Patient history: 85 year old female with chronic right and left MCA infarcts who presented with left-sided facial twitching and left-sided weakness, right gaze preference concerning for seizures which resolved after AED.  Now having  frequent facial twitching in setting of hypoglycemia. EEG to evaluate for seizures.  Level of alertness: Awake  AEDs during EEG study: Dilantin  Technical aspects: This EEG study was done with scalp electrodes positioned according to the 10-20 International system of electrode placement. Electrical activity was acquired at a sampling rate of 500Hz  and reviewed with a high frequency filter of 70Hz  and a low frequency filter of 1Hz . EEG data were recorded continuously and digitally stored.  Description: During awake state, no clear posterior dominant rhythm was  seen.  EEG showed continuous generalized and lateralized right hemisphere 3 to 6 Hz theta-delta slowing.  Hyperventilation and photic summation were not performed.  Multiple episodes of semi-rhythmic left corner of mouth twitching were noted during which patient was alert and oriented. Concomitant eeg before during and after the episodes didn't show any change to suggest seizure. Abnormality -Continuous slow, generalized and lateralized right hemisphere  IMPRESSION: This study is suggestive of bilateral cortical dysfunction ( right>left)  likely secondary to underlying encephalomalacia as well as mild diffuse encephalopathy, nonspecific etiology. No epileptiform discharges were seen throughout the recording. Multiple episodes of semi-rhythmic left corner of mouth twitching were noted without any EEG change to suggest seizure. However, focal motor seizures may not be seen on scalp eeg. Clinical semiology of the episodes is less likely to be epileptic. Clinical correlation is suggested. Lora Havens   EEG  Result Date: 06/02/2019 Lora Havens, MD     06/02/2019 11:23 AM Patient Name: JASMIN WINBERRY MRN: [id number] Epilepsy Attending: Lora Havens Referring Physician/Provider: Dr. Kerney Elbe Date: 06/01/2019 Duration: 22.03 minutes Patient history: 85 year old female with chronic right and left MCA infarcts who presented with left-sided facial twitching and left-sided weakness, right gaze preference concerning for seizures.  EEG to evaluate for seizures. Level of alertness: Awake AEDs during EEG study: Dilantin Technical aspects: This EEG study was done with scalp electrodes positioned according to the 10-20 International system of electrode placement. Electrical activity was acquired at a sampling rate of 500Hz  and reviewed with a high frequency filter of 70Hz  and a low frequency filter of 1Hz . EEG data were recorded continuously and digitally stored. Description: During awake state, no clear  posterior dominant rhythm was seen.  EEG showed continuous generalized and lateralized right hemisphere 3 to 6 Hz theta-delta slowing.  Hyperventilation and photic summation were not performed. Of note, EEG was technically limited due to significant motion artifact. Abnormality -Continuous slow, generalized and lateralized right hemisphere IMPRESSION: This technically limited study is suggestive of bilateral cortical dysfunction likely secondary to underlying encephalomalacia as well as mild diffuse encephalopathy, nonspecific etiology. No seizures or epileptiform discharges were seen throughout the recording.   CT Head Wo Contrast  Result Date: 06/01/2019 CLINICAL DATA:  Slurred speech left-sided facial droop EXAM: CT HEAD WITHOUT CONTRAST TECHNIQUE: Contiguous axial images were obtained from the base of the skull through the vertex without intravenous contrast. COMPARISON:  CT head 03/06/2019 FINDINGS: Brain: Generalized atrophy. Chronic infarct left frontal lobe and chronic infarct right frontal parietal lobe unchanged. Chronic infarct left thalamus. Negative for acute infarct, hemorrhage, mass. Vascular: Atherosclerotic calcification in the carotid and vertebral arteries. Negative for hyperdense vessel Skull: Negative Sinuses/Orbits: Paranasal sinuses clear.  Bilateral cataract surgery Other: None IMPRESSION: Atrophy and chronic ischemic changes stable from prior study. No acute abnormality Electronically Signed   By: Franchot Gallo M.D.   On: 06/01/2019 14:41   MR ANGIO HEAD WO CONTRAST  Result Date: 06/02/2019 CLINICAL DATA:  Focal  neuro deficit greater than 6 hours. Rule out stroke. Atrial fibrillation. EXAM: MRI HEAD WITHOUT CONTRAST MRA HEAD WITHOUT CONTRAST TECHNIQUE: Multiplanar, multiecho pulse sequences of the brain and surrounding structures were obtained without intravenous contrast. Angiographic images of the head were obtained using MRA technique without contrast. COMPARISON:  MRI head  12/02/2018 FINDINGS: MRI HEAD FINDINGS Brain: Subcentimeter acute infarct right superior cerebellum. No other acute infarct. Chronic hemorrhagic infarct left frontal lobe. Chronic hemorrhagic infarct right parietal lobe. Chronic microvascular ischemic changes throughout the white matter. Chronic infarct right paracentral pons. Chronic infarct left thalamus. Moderate atrophy. Negative for mass lesion. Vascular: Normal arterial flow voids. Skull and upper cervical spine: Negative Sinuses/Orbits: Paranasal sinuses clear.  Bilateral cataract surgery Other: None MRA HEAD FINDINGS Both vertebral arteries patent to the basilar. Left PICA patent. Right PICA not visualized. Atherosclerotic irregularity in the basilar without stenosis. Moderate to severe stenosis left P2 segment. Right posterior cerebral artery patent. Superior cerebellar arteries patent. Cavernous carotid mildly narrowed due to atherosclerotic disease bilaterally. Moderate to severe stenosis left MCA bifurcation. Moderate stenosis origin of left anterior cerebral artery which is otherwise patent. Moderate stenosis distal right A1 segment. Multiple areas of stenosis right A2. Diffuse irregularity and mild to moderate stenosis right middle cerebral artery extending into the M2 branches. Negative for cerebral aneurysm. IMPRESSION: Subcentimeter acute infarct right superior cerebellum Atrophy and extensive chronic ischemic changes above. Extensive intracranial atherosclerotic disease as above. Electronically Signed   By: Marlan Palau M.D.   On: 06/02/2019 15:33   MR Brain Wo Contrast (neuro protocol)  Result Date: 06/02/2019 CLINICAL DATA:  Focal neuro deficit greater than 6 hours. Rule out stroke. Atrial fibrillation. EXAM: MRI HEAD WITHOUT CONTRAST MRA HEAD WITHOUT CONTRAST TECHNIQUE: Multiplanar, multiecho pulse sequences of the brain and surrounding structures were obtained without intravenous contrast. Angiographic images of the head were obtained  using MRA technique without contrast. COMPARISON:  MRI head 12/02/2018 FINDINGS: MRI HEAD FINDINGS Brain: Subcentimeter acute infarct right superior cerebellum. No other acute infarct. Chronic hemorrhagic infarct left frontal lobe. Chronic hemorrhagic infarct right parietal lobe. Chronic microvascular ischemic changes throughout the white matter. Chronic infarct right paracentral pons. Chronic infarct left thalamus. Moderate atrophy. Negative for mass lesion. Vascular: Normal arterial flow voids. Skull and upper cervical spine: Negative Sinuses/Orbits: Paranasal sinuses clear.  Bilateral cataract surgery Other: None MRA HEAD FINDINGS Both vertebral arteries patent to the basilar. Left PICA patent. Right PICA not visualized. Atherosclerotic irregularity in the basilar without stenosis. Moderate to severe stenosis left P2 segment. Right posterior cerebral artery patent. Superior cerebellar arteries patent. Cavernous carotid mildly narrowed due to atherosclerotic disease bilaterally. Moderate to severe stenosis left MCA bifurcation. Moderate stenosis origin of left anterior cerebral artery which is otherwise patent. Moderate stenosis distal right A1 segment. Multiple areas of stenosis right A2. Diffuse irregularity and mild to moderate stenosis right middle cerebral artery extending into the M2 branches. Negative for cerebral aneurysm. IMPRESSION: Subcentimeter acute infarct right superior cerebellum Atrophy and extensive chronic ischemic changes above. Extensive intracranial atherosclerotic disease as above. Electronically Signed   By: Marlan Palau M.D.   On: 06/02/2019 15:33   DG CHEST PORT 1 VIEW  Result Date: 06/02/2019 CLINICAL DATA:  85 year old female status post PICC placement EXAM: PORTABLE CHEST 1 VIEW COMPARISON:  Chest radiograph dated 03/05/2019 FINDINGS: Right-sided PICC with tip in the region of the cavoatrial junction. There is no focal consolidation, pleural effusion, or pneumothorax. Stable  cardiomegaly. Atherosclerotic calcification of the aorta. No acute osseous pathology. IMPRESSION: 1.  Right-sided PICC with tip at the cavoatrial junction. 2. No acute cardiopulmonary process. 3. Cardiomegaly. Electronically Signed   By: Elgie Collard M.D.   On: 06/02/2019 21:12   DG Hip Unilat With Pelvis 2-3 Views Left  Result Date: 06/01/2019 CLINICAL DATA:  Found by son seated in chair altered with slurred speech and left-sided facial droop EXAM: DG HIP (WITH OR WITHOUT PELVIS) 2-3V LEFT COMPARISON:  Radiograph 12/01/2018 FINDINGS: The osseous structures appear diffusely demineralized which may limit detection of small or nondisplaced fractures. Periprosthetic lucency involving the right hip hemiarthroplasty is similar to comparison radiographs and therefore nonspecific though loosening cannot be fully excluded. Moderate left hip arthrosis. Bilateral SI joint degenerative change as well as additional degenerative features in the lower lumbar spine. The sacrum is poorly assessed given extensive overlying bowel gas. Vascular calcium noted in the medial thighs and pelvis. IMPRESSION: 1. The osseous structures appear diffusely demineralized which may limit detection of small or nondisplaced fractures. 2. Periprosthetic lucency involving the right hip hemiarthroplasty is similar to comparison radiographs and therefore nonspecific though loosening cannot be fully excluded. Electronically Signed   By: Kreg Shropshire M.D.   On: 06/01/2019 15:56   [place] EKG SITE RITE  Result Date: 06/02/2019 If Site Rite image not attached, placement could not be confirmed due to current cardiac rhythm.       Scheduled Meds: . aspirin  300 mg Rectal Daily  . atorvastatin  20 mg Oral q1800  . Chlorhexidine Gluconate Cloth  6 each Topical Daily  . docusate sodium  100 mg Oral BID  . enoxaparin (LOVENOX) injection  40 mg Subcutaneous Q24H  . LORazepam  1 mg Intravenous Once  . pantoprazole (PROTONIX) IV  40 mg Intravenous  Q24H  . sertraline  50 mg Oral Daily  . sodium chloride flush  10-40 mL Intracatheter Q12H  . sodium chloride flush  3 mL Intravenous Q12H   Continuous Infusions: . sodium chloride    . cefTRIAXone (ROCEPHIN)  IV 1 g (06/02/19 1740)  . dextrose 50 mL/hr at 06/03/19 0500  . phenytoin (DILANTIN) IV Stopped (06/03/19 0154)          Glade Lloyd, MD [facility] 06/03/2019, 11:01 AM

## 2019-06-03 NOTE — Progress Notes (Signed)
Hypoglycemic Event  CBG: 48  Treatment: 25g of dextrose  Symptoms: None  Follow-up CBG: Time:0416 CBG Result:165  Possible Reasons for Event:Not sure Comments/MD notified APP K. Trevor Mace A Essien[place]

## 2019-06-03 NOTE — TOC Initial Note (Addendum)
Transition of Care [facility]) - Initial/Assessment Note    Patient Details  Name: [name] [name] MRN: [id number] Date of Birth: 1934-07-03  Transition of Care [facility]) CM/SW Contact:    Kirstie Peri, [place] Work Phone Number: 06/03/2019, 11:12 AM  Clinical Narrative:                 MSW Intern spoke with pt nurse and verified that pt continues to be disoriented. MSW called pt's daughter and was not able to leave a message. MSW Intern then called Augustin Coupe, pt's son. He noted that his sister is the POA for the PT. He noted that the pt had been to [facility] previously and they would like her to go back there. He requested that his sister be contacted to discuss discharge planning.  MSW Intern contacted Levada Dy again, and she is on board for SNF placement at [facility]. She stated that the PT had a Wheelchair, Walker, and cane, but needed a shower chair. She also confirmed that PT lived alone, but "Augustin Coupe lives eight steps away, on the same land". MSW received permission to contact [facility], and to fax out the PT. FL2 completed, PT faxed out.   Addended 06/03/2019 11:41 Am MSW Intern learned that [facility] is not accepting PT's at this time. Daughter agreed to have PT faxed out to facilities in [place]. MSW Intern will continue to follow. Expected Discharge Plan: Skilled Nursing Facility Barriers to Discharge: Continued Medical Work up   Patient Goals and CMS Choice Patient states their goals for this hospitalization and ongoing recovery are:: Patient unable to participate in goal setting due to altered mental status. CMS Medicare.gov Compare Post Acute Care list provided to:: Patient Represenative (must comment)(Patients son, Augustin Coupe) Choice offered to / list presented to : Adult Children  Expected Discharge Plan and Services Expected Discharge Plan: [place] Acute Care Choice: [place] arrangements for the past 2 months: Single  Family Home                                      Prior Living Arrangements/Services Living arrangements for the past 2 months: Single Family Home Lives with:: Adult Children Patient language and need for interpreter reviewed:: Yes Do you feel safe going back to the place where you live?: Yes      Need for Family Participation in Patient Care: Yes (Comment) Care giver support system in place?: Yes (comment)   Criminal Activity/Legal Involvement Pertinent to Current Situation/Hospitalization: No - Comment as needed  Activities of Daily Living Home Assistive Devices/Equipment: CBG Meter ADL Screening (condition at time of admission) Patient's cognitive ability adequate to safely complete daily activities?: No Is the patient deaf or have difficulty hearing?: No Does the patient have difficulty seeing, even when wearing glasses/contacts?: No Does the patient have difficulty concentrating, remembering, or making decisions?: No Patient able to express need for assistance with ADLs?: No Does the patient have difficulty dressing or bathing?: No Independently performs ADLs?: No Communication: Needs assistance Is this a change from baseline?: Pre-admission baseline Dressing (OT): Dependent Is this a change from baseline?: Change from baseline, expected to last <3days Grooming: Dependent Is this a change from baseline?: Change from baseline, expected to last <3 days Feeding: Dependent Is this a change from baseline?: Change from baseline, expected to last <3 days Bathing: Needs assistance, Dependent Is this  a change from baseline?: Pre-admission baseline Toileting: Dependent Is this a change from baseline?: Change from baseline, expected to last <3 days In/Out Bed: Dependent Is this a change from baseline?: Change from baseline, expected to last <3 days Walks in Home: Needs assistance Is this a change from baseline?: Pre-admission baseline Does the patient have difficulty walking  or climbing stairs?: Yes Weakness of Legs: Both Weakness of Arms/Hands: Both  Permission Sought/Granted Permission sought to share information with : Facility Industrial/product designer granted to share information with : Yes, Verbal Permission Granted  Share Information with NAME: Era Bumpers, Daughter  Permission granted to share info w AGENCY: [facility]  Permission granted to share info w Relationship: Daughter  Permission granted to share info w Contact Information: [phone]  Emotional Assessment Appearance:: Other (Comment Required(Unable to assess) Attitude/Demeanor/Rapport: Unable to Assess Affect (typically observed): Unable to Assess Orientation: : (Disoriented) Alcohol / Substance Use: Not Applicable Psych Involvement: No (comment)  Admission diagnosis:  CVA (cerebrovascular accident) (HCC) [I63.9] Facial twitching [G51.4] Altered mental status, unspecified altered mental status type [R41.82] Patient Active Problem List   Diagnosis Date Noted  . Facial twitching 06/01/2019  . Slurred speech 06/01/2019  . CVA (cerebrovascular accident) (HCC) 06/01/2019  . Weak   . Acute on chronic renal insufficiency   . Hyperglycemia   . CVA (cerebral vascular accident) (HCC) 12/01/2018  . Stage 2 Pressure ulcer, sacrum 11/25/2015  . 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 Foreign body of hip with infection   . Essential hypertension   . Pulmonary hypertension (HCC)   . Acute renal failure (HCC)   . Uncontrolled type 2 diabetes mellitus with complication (HCC)   . Postoperative wound infection of right hip   . MSSA infection, non-invasive   . Infection caused by Enterobacter cloacae   . Paroxysmal atrial fibrillation (HCC)   . Acute blood loss anemia   . Neurogenic bladder   . Sinus bradycardia 11/20/2015  . HCAP (healthcare-associated pneumonia)   . Acute renal failure (ARF) (HCC)   . Pressure ulcer 11/17/2015  . Sepsis (HCC)  11/16/2015  . AKI (acute kidney injury) (HCC)   . Metabolic acidosis   . Hypocalcemia   . Acute respiratory failure with hypoxia (HCC)   . Hypertensive heart disease   . Acute on chronic diastolic CHF (congestive heart failure) (HCC)   . Coronary artery disease involving native coronary artery of native heart without angina pectoris   . Persistent atrial fibrillation (HCC)   . Dyspnea 07/18/2015  . Follicular acne 07/07/2015  . Acute diastolic heart failure (HCC) 06/16/2015  . Routine history and physical examination of adult 01/13/2015  . Dermatitis 06/27/2014  . CKD (chronic kidney disease), stage III   . Bradycardia 06/24/2014  . Diabetes mellitus type 2, uncontrolled (HCC) 03/25/2014  . Depression 03/25/2014  . Medicare annual wellness visit, subsequent 01/10/2014  . Anxiety state 01/10/2014  . Carotid stenosis 11/16/2013  . CVA (cerebral infarction) 10/21/2013  . Screening for breast cancer 01/14/2013  . Poorly controlled type 2 diabetes mellitus with circulatory disorder (HCC) 02/04/2012  . Chronic UTI 06/12/2011  . GERD (gastroesophageal reflux disease) 06/12/2011  . Hyperlipidemia 06/12/2011  . Hypertension 05/17/2011  . Hypothyroidism 03/07/2011  . Chronic back pain 02/21/2011  . Morbid obesity (HCC) 08/17/2008  . CAD (coronary artery disease) 08/17/2008  . Atrial fibrillation (HCC) 08/17/2008  . Osteoarthritis, knee 08/17/2008   PCP:  Galvin Proffer, MD Pharmacy:   [phone] -  [place], [place] - [address] AT [facility] [address] [place] [address] Phone: [phone] Fax: [phone]     Social Determinants of Health (SDOH) Interventions    Readmission Risk Interventions Readmission Risk Prevention Plan 12/11/2018  Transportation Screening Complete  PCP or Specialist Appt within 3-5 Days Complete  HRI or Home Care Consult Complete  Social Work Consult for Recovery Care Planning/Counseling Complete  Palliative  Care Screening Complete  Medication Review Oceanographer) Complete  Some recent data might be hidden

## 2019-06-03 NOTE — Progress Notes (Addendum)
Subjective:  Patient is laying in bed, watching TV, has no complaints.  Perseverating on the fact that she wants the the TV back on after it was turned off in order to facilitate the neurological exam.   Objective:  BP (!) 121/57 (BP Location: Left Arm)   Pulse 65   Temp 98.2 F (36.8 C) (Axillary)   Resp 15   Wt 69.2 kg   SpO2 100%   BMI 27.02 kg/m   General Exam: HEENT: Normocephalic atraumatic Lungs: Clear to auscultation Ext: Warm dry intact  Neurological exam: Ment: Able to follow simple commands such as raising her arms, knows she is in the hospital, answers "1854" when asked for the year.  Able to name my thumb. Slight difficulty with repeating in the context of poor attention during the exam.  No dysarthria and no aphasia. CN:  II. Tracks examiner in the room. PERRL III. EOMI  VII: Slight left facial droop  Motor: Raises both arms; however, left is weaker than right, withdraws bilateral legs to noxious stimuli. Sensory: Intact throughout  EEG (1/13):  This study issuggestive of bilateral cortical dysfunction ( right>left)  likely secondary to underlying encephalomalacia as well as mild diffuse encephalopathy, nonspecific etiology. No epileptiform discharges were seen throughout the recording. Multiple episodes of semi-rhythmic left corner of mouth twitching were noted without any EEG change to suggest seizure. However, focal motor seizures may not be seen on scalp eeg. Clinical semiology of the episodes is less likely to be epileptic.    MRI brain: Subcentimeter acute infarct right superior cerebellum. Also noted are chronic infarcts as follows: Chronic hemorrhagic infarct left frontal lobe, chronic hemorrhagic infarct right parietal lobe, chronic infarct right paracentral pons, chronic infarct left thalamus and chronic microvascular ischemic changes throughout the white matter.   MRA head: Extensive intracranial atherosclerotic disease.   TTE: 1. Left ventricular  ejection fraction, by visual estimation, is 40 to 45%. The left ventricle has mildly decreased function. Left ventricular septal wall thickness was mildly increased. Mildly increased left ventricular posterior wall thickness. There is mildly increased left ventricular hypertrophy.  2. Left ventricular diastolic parameters are indeterminate.  3. The left ventricle demonstrates global hypokinesis.  4. Global right ventricle has normal systolic function.The right ventricular size is normal. No increase in right ventricular wall thickness.  5. Left atrial size was severely dilated.  6. Right atrial size was normal.  7. Moderate mitral annular calcification.  8. Moderate thickening of the mitral valve leaflet(s).  9. The mitral valve is normal in structure. Mild mitral valve regurgitation. No evidence of mitral stenosis. 10. The tricuspid valve is normal in structure. 11. The aortic valve is tricuspid. Aortic valve regurgitation is mild. No evidence of aortic valve sclerosis or stenosis. 12. The pulmonic valve was normal in structure. Pulmonic valve regurgitation is not visualized. 13. Normal pulmonary artery systolic pressure. 14. The inferior vena cava is normal in size with greater than 50% respiratory variability, suggesting right atrial pressure of 3 mmHg.  Carotid ultrasound: -- Right Carotid: Velocities in the right ICA are consistent with a 1-39% stenosis. -- Left Carotid: Velocities in the left ICA are consistent with a 1-39% stenosis. -- Vertebrals:  Bilateral vertebral arteries demonstrate antegrade flow.  A/R: 85 year old female presenting with status epilepticus. Has atrial fibrillation. Acute right cerebellar ischemic stroke seen on MRI. Multiple old strokes seen on MRI as well. MRA head with extensive intracranial atherosclerotic disease.  1. Acute stroke. Predisposing factors include atrial fibrillation, advanced age and  severe intracranial atherosclerosis seen on MRA. TTE revealed  no mural thrombus. Carotid ultrasound without severe stenosis. Will need to be started on anticoagulation if eventually improves with PT such that she is not a fall risk; however, based on current PT recommendations of fall restrictions, would hold off on anticoagulation for now. Until then PR ASA 300mg  qd and add Plavix 75 mg PO (ordered).  2. Seizures. Now resolved. Continue Dilantin scheduled at 200 mg IV BID. 3. Will need outpatient Stroke Neurology follow up, to include monitoring of efficacy of her anticonvulsant regimen as well.  4. Neurohospitalist service will sign off. Please call if there are additional questions.   Electronically signed: Dr. 

## 2019-06-03 NOTE — Progress Notes (Signed)
Carotid duplex exam completed.     Preliminary results can be found under CV proc under chart review.  06/03/2019 2:57 PM  Mariza Bourget, K., RDMS, RVT

## 2019-06-03 NOTE — Progress Notes (Signed)
  Echocardiogram 2D Echocardiogram has been performed.  [name] 06/03/2019, 1:19 PM

## 2019-06-03 NOTE — NC FL2 (Signed)
[place] MEDICAID FL2 LEVEL OF CARE SCREENING TOOL     IDENTIFICATION  Patient Name: [name] [name] Birthdate: [birth year] Sex: female Admission Date (Current Location): 06/01/2019  [facility] and [place] Number:  Producer, television/film/video and Address:  [facility] [place] [address], [place], [place] [address]      Provider Number: [id number]  Attending Physician Name and Address:  Glade Lloyd, MD  Relative Name and Phone Number:  [name], [phone]    Current Level of Care: Hospital Recommended Level of Care: Skilled Nursing Facility Prior Approval Number:    Date Approved/Denied:   PASRR Number: [id number]  Discharge Plan: SNF    Current Diagnoses: Patient Active Problem List   Diagnosis Date Noted  . Facial twitching 06/01/2019  . Slurred speech 06/01/2019  . CVA (cerebrovascular accident) (HCC) 06/01/2019  . Weak   . Acute on chronic renal insufficiency   . Hyperglycemia   . CVA (cerebral vascular accident) (HCC) 12/01/2018  . Stage 2 Pressure ulcer, sacrum 11/25/2015  . 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 Foreign body of hip with infection   . Essential hypertension   . Pulmonary hypertension (HCC)   . Acute renal failure (HCC)   . Uncontrolled type 2 diabetes mellitus with complication (HCC)   . Postoperative wound infection of right hip   . MSSA infection, non-invasive   . Infection caused by Enterobacter cloacae   . Paroxysmal atrial fibrillation (HCC)   . Acute blood loss anemia   . Neurogenic bladder   . Sinus bradycardia 11/20/2015  . HCAP (healthcare-associated pneumonia)   . Acute renal failure (ARF) (HCC)   . Pressure ulcer 11/17/2015  . Sepsis (HCC) 11/16/2015  . AKI (acute kidney injury) (HCC)   . Metabolic acidosis   . Hypocalcemia   . Acute respiratory failure with hypoxia (HCC)   . Hypertensive heart disease   . Acute on chronic diastolic CHF (congestive heart failure) (HCC)   .  Coronary artery disease involving native coronary artery of native heart without angina pectoris   . Persistent atrial fibrillation (HCC)   . Dyspnea 07/18/2015  . Follicular acne 07/07/2015  . Acute diastolic heart failure (HCC) 06/16/2015  . Routine history and physical examination of adult 01/13/2015  . Dermatitis 06/27/2014  . CKD (chronic kidney disease), stage III   . Bradycardia 06/24/2014  . Diabetes mellitus type 2, uncontrolled (HCC) 03/25/2014  . Depression 03/25/2014  . Medicare annual wellness visit, subsequent 01/10/2014  . Anxiety state 01/10/2014  . Carotid stenosis 11/16/2013  . CVA (cerebral infarction) 10/21/2013  . Screening for breast cancer 01/14/2013  . Poorly controlled type 2 diabetes mellitus with circulatory disorder (HCC) 02/04/2012  . Chronic UTI 06/12/2011  . GERD (gastroesophageal reflux disease) 06/12/2011  . Hyperlipidemia 06/12/2011  . Hypertension 05/17/2011  . Hypothyroidism 03/07/2011  . Chronic back pain 02/21/2011  . Morbid obesity (HCC) 08/17/2008  . CAD (coronary artery disease) 08/17/2008  . Atrial fibrillation (HCC) 08/17/2008  . Osteoarthritis, knee 08/17/2008    Orientation RESPIRATION BLADDER Height & Weight     (Disoriented)  Normal Incontinent, External catheter Weight: 152 lb 8.9 oz (69.2 kg) Height:     BEHAVIORAL SYMPTOMS/MOOD NEUROLOGICAL BOWEL NUTRITION STATUS      Incontinent Diet(See discharge summary)  AMBULATORY STATUS COMMUNICATION OF NEEDS Skin   Total Care Verbally Other (Comment)(Skin tear, right Hip)  Personal Care Assistance Level of Assistance  Total care       Total Care Assistance: Maximum assistance   Functional Limitations Info  (Unable to assess)          SPECIAL CARE FACTORS FREQUENCY  PT (By licensed PT), OT (By licensed OT)     PT Frequency: 5x week OT Frequency: 5x week            Contractures Contractures Info: Not present    Additional Factors Info   Code Status, Allergies Code Status Info: DNR Allergies Info: Oxycodone           Current Medications (06/03/2019):  This is the current hospital active medication list Current Facility-Administered Medications  Medication Dose Route Frequency Provider Last Rate Last Admin  . 0.9 %  sodium chloride infusion  250 mL Intravenous PRN Norins, Rosalyn Gess, MD      . albuterol (VENTOLIN HFA) 108 (90 Base) MCG/ACT inhaler 2 puff  2 puff Inhalation Q6H PRN Norins, Rosalyn Gess, MD      . aspirin suppository 300 mg  300 mg Rectal Daily Marlin Canary U, DO   300 mg at 06/03/19 0946  . atorvastatin (LIPITOR) tablet 20 mg  20 mg Oral q1800 Norins, Rosalyn Gess, MD      . cefTRIAXone (ROCEPHIN) 1 g in sodium chloride 0.9 % 100 mL IVPB  1 g Intravenous Q24H Vann, Savanah Bayles U, DO 200 mL/hr at 06/03/19 1104 1 g at 06/03/19 1104  . Chlorhexidine Gluconate Cloth 2 % PADS 6 each  6 each Topical Daily Joseph Art, DO   6 each at 06/03/19 0955  . dextrose 10 % infusion   Intravenous Continuous Marlin Canary U, DO 50 mL/hr at 06/03/19 0500 Rate Verify at 06/03/19 0500  . dextrose 50 % solution 25 g  25 g Intravenous Q4H PRN Marlin Canary U, DO   25 g at 06/03/19 0338  . docusate sodium (COLACE) capsule 100 mg  100 mg Oral BID Norins, Rosalyn Gess, MD   Stopped at 06/02/19 2219  . enoxaparin (LOVENOX) injection 40 mg  40 mg Subcutaneous Q24H Vann, Jemimah Cressy U, DO   40 mg at 06/02/19 1745  . LORazepam (ATIVAN) injection 1 mg  1 mg Intravenous Once Marikay Alar, FNP      . metoprolol tartrate (LOPRESSOR) injection 5 mg  5 mg Intravenous Q6H PRN Vann, Marsean Elkhatib U, DO      . nitroGLYCERIN (NITROSTAT) SL tablet 0.4 mg  0.4 mg Sublingual Q5 min PRN Norins, Rosalyn Gess, MD      . pantoprazole (PROTONIX) injection 40 mg  40 mg Intravenous Q24H Norins, Rosalyn Gess, MD   40 mg at 06/02/19 2007  . phenytoin (DILANTIN) 200 mg in sodium chloride 0.9 % 100 mL IVPB  200 mg Intravenous Q12H Marlin Canary U, DO   Stopped at 06/03/19 0154  .  sertraline (ZOLOFT) tablet 50 mg  50 mg Oral Daily Norins, Rosalyn Gess, MD   Stopped at 06/03/19 1003  . sodium chloride flush (NS) 0.9 % injection 10-40 mL  10-40 mL Intracatheter Q12H Vann, Myha Arizpe U, DO   10 mL at 06/02/19 2219  . sodium chloride flush (NS) 0.9 % injection 10-40 mL  10-40 mL Intracatheter PRN Benjamine Mola, Abdulmalik Darco U, DO      . sodium chloride flush (NS) 0.9 % injection 3 mL  3 mL Intravenous Q12H Norins, Rosalyn Gess, MD   3 mL at 06/02/19 2220  . sodium chloride flush (NS) 0.9 %  injection 3 mL  3 mL Intravenous PRN Norins, Heinz Knuckles, MD         Discharge Medications: Please see discharge summary for a list of discharge medications.  Relevant Imaging Results:  Relevant Lab Results:   Additional Information SS# [id number]  Kirstie Peri, Student-Social Work

## 2019-06-04 DIAGNOSIS — Z515 Encounter for palliative care: Secondary | ICD-10-CM

## 2019-06-04 DIAGNOSIS — R339 Retention of urine, unspecified: Secondary | ICD-10-CM

## 2019-06-04 DIAGNOSIS — N390 Urinary tract infection, site not specified: Secondary | ICD-10-CM

## 2019-06-04 LAB — CBC WITH DIFFERENTIAL/PLATELET
Abs Immature Granulocytes: 0.04 10*3/uL (ref 0.00–0.07)
Basophils Absolute: 0.1 10*3/uL (ref 0.0–0.1)
Basophils Relative: 1 %
Eosinophils Absolute: 0.1 10*3/uL (ref 0.0–0.5)
Eosinophils Relative: 1 %
HCT: 36.5 % (ref 36.0–46.0)
Hemoglobin: 11.7 g/dL — ABNORMAL LOW (ref 12.0–15.0)
Immature Granulocytes: 1 %
Lymphocytes Relative: 17 %
Lymphs Abs: 1.3 10*3/uL (ref 0.7–4.0)
MCH: 29.6 pg (ref 26.0–34.0)
MCHC: 32.1 g/dL (ref 30.0–36.0)
MCV: 92.4 fL (ref 80.0–100.0)
Monocytes Absolute: 0.7 10*3/uL (ref 0.1–1.0)
Monocytes Relative: 9 %
Neutro Abs: 5.4 10*3/uL (ref 1.7–7.7)
Neutrophils Relative %: 71 %
Platelets: 236 10*3/uL (ref 150–400)
RBC: 3.95 MIL/uL (ref 3.87–5.11)
RDW: 13.3 % (ref 11.5–15.5)
WBC: 7.5 10*3/uL (ref 4.0–10.5)
nRBC: 0 % (ref 0.0–0.2)

## 2019-06-04 LAB — BASIC METABOLIC PANEL
Anion gap: 8 (ref 5–15)
BUN: 26 mg/dL — ABNORMAL HIGH (ref 8–23)
CO2: 25 mmol/L (ref 22–32)
Calcium: 8.2 mg/dL — ABNORMAL LOW (ref 8.9–10.3)
Chloride: 104 mmol/L (ref 98–111)
Creatinine, Ser: 1.67 mg/dL — ABNORMAL HIGH (ref 0.44–1.00)
GFR calc Af Amer: 33 mL/min — ABNORMAL LOW (ref >60)
GFR calc non Af Amer: 28 mL/min — ABNORMAL LOW (ref >60)
Glucose, Bld: 245 mg/dL — ABNORMAL HIGH (ref 70–99)
Potassium: 3.6 mmol/L (ref 3.5–5.1)
Sodium: 137 mmol/L (ref 135–145)

## 2019-06-04 LAB — GLUCOSE, CAPILLARY
Glucose-Capillary: 131 mg/dL — ABNORMAL HIGH (ref 70–99)
Glucose-Capillary: 166 mg/dL — ABNORMAL HIGH (ref 70–99)
Glucose-Capillary: 215 mg/dL — ABNORMAL HIGH (ref 70–99)
Glucose-Capillary: 216 mg/dL — ABNORMAL HIGH (ref 70–99)
Glucose-Capillary: 221 mg/dL — ABNORMAL HIGH (ref 70–99)
Glucose-Capillary: 229 mg/dL — ABNORMAL HIGH (ref 70–99)
Glucose-Capillary: 229 mg/dL — ABNORMAL HIGH (ref 70–99)
Glucose-Capillary: 275 mg/dL — ABNORMAL HIGH (ref 70–99)
Glucose-Capillary: 280 mg/dL — ABNORMAL HIGH (ref 70–99)
Glucose-Capillary: 282 mg/dL — ABNORMAL HIGH (ref 70–99)
Glucose-Capillary: 291 mg/dL — ABNORMAL HIGH (ref 70–99)

## 2019-06-04 LAB — MAGNESIUM: Magnesium: 1.5 mg/dL — ABNORMAL LOW (ref 1.7–2.4)

## 2019-06-04 MED ORDER — PHENYTOIN SODIUM EXTENDED 200 MG PO CAPS: 200.0000 mg | ORAL_CAPSULE | Freq: Two times a day (BID) | ORAL | 0 refills | Status: AC

## 2019-06-04 MED ORDER — ATORVASTATIN CALCIUM 80 MG PO TABS
80.0000 mg | ORAL_TABLET | Freq: Every day | ORAL | Status: DC
  Administered 2019-06-04: 80 mg via ORAL
  Filled 2019-06-04: qty 1

## 2019-06-04 MED ORDER — PANTOPRAZOLE SODIUM 40 MG PO TBEC
40.0000 mg | DELAYED_RELEASE_TABLET | ORAL | Status: DC
  Administered 2019-06-04: 40 mg via ORAL
  Filled 2019-06-04: qty 1

## 2019-06-04 MED ORDER — INSULIN ASPART 100 UNIT/ML ~~LOC~~ SOLN: 0.0000 [IU] | Freq: Every day | SUBCUTANEOUS | Status: DC

## 2019-06-04 MED ORDER — ATORVASTATIN CALCIUM 80 MG PO TABS: 80.0000 mg | ORAL_TABLET | Freq: Every day | ORAL | 0 refills | Status: AC

## 2019-06-04 MED ORDER — CLOPIDOGREL BISULFATE 75 MG PO TABS
75.0000 mg | ORAL_TABLET | Freq: Every day | ORAL | Status: DC
  Administered 2019-06-04: 75 mg via ORAL
  Filled 2019-06-04: qty 1

## 2019-06-04 MED ORDER — MAGNESIUM SULFATE 2 GM/50ML IV SOLN
2.0000 g | Freq: Once | INTRAVENOUS | Status: AC
  Administered 2019-06-04: 2 g via INTRAVENOUS
  Filled 2019-06-04: qty 50

## 2019-06-04 MED ORDER — PHENYTOIN SODIUM EXTENDED 100 MG PO CAPS
200.0000 mg | ORAL_CAPSULE | Freq: Two times a day (BID) | ORAL | Status: DC
  Administered 2019-06-04 (×2): 200 mg via ORAL
  Filled 2019-06-04 (×2): qty 2

## 2019-06-04 MED ORDER — SENNA 8.6 MG PO TABS: 1.0000 | ORAL_TABLET | Freq: Every evening | ORAL | 0 refills | Status: AC | PRN

## 2019-06-04 MED ORDER — ASPIRIN 325 MG PO TABS: 325.0000 mg | ORAL_TABLET | Freq: Every day | ORAL | 0 refills | Status: AC

## 2019-06-04 MED ORDER — ASPIRIN 325 MG PO TABS
325.0000 mg | ORAL_TABLET | Freq: Every day | ORAL | Status: DC
  Administered 2019-06-04: 325 mg via ORAL
  Filled 2019-06-04: qty 1

## 2019-06-04 MED ORDER — INSULIN ASPART 100 UNIT/ML ~~LOC~~ SOLN
0.0000 [IU] | Freq: Three times a day (TID) | SUBCUTANEOUS | Status: DC
  Administered 2019-06-04 (×2): 5 [IU] via SUBCUTANEOUS

## 2019-06-04 MED ORDER — OMEPRAZOLE 20 MG PO CPDR: 20.0000 mg | DELAYED_RELEASE_CAPSULE | Freq: Every day | ORAL | Status: AC

## 2019-06-04 MED ORDER — CLOPIDOGREL BISULFATE 75 MG PO TABS: 75.0000 mg | ORAL_TABLET | Freq: Every day | ORAL | 0 refills | Status: DC

## 2019-06-04 NOTE — Progress Notes (Signed)
Chaplain visited after receiving a consult for prayer.  The patient declined.  The chaplain is available to follow-up if needed.  Lavone Neri Chaplain Resident For questions concerning this note please contact me by pager [phone]

## 2019-06-04 NOTE — Progress Notes (Signed)
[facility] notified that patient hasn't voided since 1600 and bladder scan greater 290. Patient also becoming agitated.

## 2019-06-04 NOTE — Progress Notes (Signed)
Patient ID: [name] [name], female   DOB: 07-28-1934, 85 y.o.   MRN: [id number]  PROGRESS NOTE    [name] [name]  LGX:[id number] DOB: Nov 15, 1934 DOA: 06/01/2019 PCP: Galvin Proffer, MD   Brief Narrative:  85 year old female with history of CAD status post stent to RCA, poorly controlled diabetes mellitus type 2, chronic renal disease stage III, hypothyroidism, paroxysmal A. fib with history of cardioversion, unspecified CVA in July 2020 presented with altered mental status, weakness more on the left side with facial twitching and slurred speech.  CT of the head was negative for any acute intracranial abnormality.  Neurology was consulted.  Assessment & Plan:   Acute right cerebellar infarct Acute metabolic encephalopathy -Presented with altered mental status, weakness more on the left side with facial twitching and slurred speech -CT of the head was negative for acute infarct.  MRI of the brain showed acute right cerebellar infarct. -Neurology evaluation appreciated.  Initially there was a concern for seizure as well.  Patient was loaded with fosphenytoin.  EEG did not show any epileptiform discharges. -Continue monitoring mental status.  Currently awake but hardly answers any questions -PT/OT recommend SNF placement. -SLP eval -A1c 9.7.  LDL 112. -Neurology has signed off with recommendations: Continue aspirin, Plavix and statin.  TTE revealed no mural thrombus.  Carotid ultrasound without severe stenosis.  Because patient is a fall risk currently as per PT, neurology recommends to hold off on anticoagulation for now.  Outpatient follow-up with neurology  Question of seizures -Currently on Dilantin 200 twice a day as per neurology.  Will need outpatient neurology follow-up  paroxysmal atrial fibrillation -Status post cardioversion in 2015 per chart -Was not on anticoagulation prior to this hospitalization for unknown reason -Previously was on Eliquis but did not take it because of  cost.  Subsequently, during her last hospitalization in July thousand 20, she was discharged on Coumadin and Lovenox. -Anticoagulation recommendation as above  Acute kidney injury on chronic kidney disease stage IIIb -Baseline creatinine around 1.4-1.5 -Presented with creatinine of 2.29.  Improving to 1.67 today.  Monitor  Probable UTI: Present on admission -Culture negative so far.  Finished 3-day course of Rocephin  Hypomagnesemia -Replace  Diabetes mellitus type 2 with hypoglycemia and hyperglycemia -Continue CBGs.  A1c 9.7. -Blood sugars now elevated.  Might have to restart insulin.  Hypertension -Monitor.  Allow for permissive hypertension  Generalized deconditioning -Overall prognosis is guarded to poor.  Palliative care consultation pending  DVT prophylaxis: Lovenox Code Status: DNR Family Communication: None at bedside Disposition Plan: SNF once bed is available.  Consultants: Neurology  Procedures:  EEG  Antimicrobials: Rocephin from 06/02/2019 onwards   Subjective: Patient seen and examined at bedside.  Very poor historian.  Awake but hardly answers any questions.  No overnight fever, vomiting or seizures reported by nursing staff.  Objective: Vitals:   06/03/19 1627 06/03/19 1957 06/04/19 0002 06/04/19 0416  BP: 134/68 (!) 155/52 96/77 (!) 146/57  Pulse: 72 60 71 61  Resp: 14 14 (!) 22 16  Temp: 98.6 F (37 C) 97.9 F (36.6 C) 97.9 F (36.6 C) (!) 97.5 F (36.4 C)  TempSrc: Axillary Oral Oral Oral  SpO2: 100% 100% 97% 100%  Weight:        Intake/Output Summary (Last 24 hours) at 06/04/2019 0743 Last data filed at 06/04/2019 0229 Gross per 24 hour  Intake 987.1 ml  Output 1300 ml  Net -312.9 ml   Filed Weights   06/01/19 1500 06/01/19 2108  Weight: 82 kg 69.2 kg    Examination:  General exam: Very poor historian.  Elderly female lying in bed.  Looks chronically ill.  Awake but hardly answers any questions Respiratory system: Bilateral  decreased breath sounds at bases with some scattered crackles Cardiovascular system: S1 & S2 heard, currently rate controlled Gastrointestinal system: Abdomen is nondistended, soft and nontender. Normal bowel sounds heard. Extremities: No cyanosis, edema     Data Reviewed: I have personally reviewed following labs and imaging studies  CBC: Recent Labs  Lab 06/01/19 1406 06/03/19 0337 06/04/19 0646  WBC 12.3* 7.1 7.5  NEUTROABS 10.0*  --  5.4  HGB 13.7 11.6* 11.7*  HCT 43.3 35.9* 36.5  MCV 95.0 93.7 92.4  PLT 338 252 236   Basic Metabolic Panel: Recent Labs  Lab 06/01/19 1406 06/03/19 0337  NA 140 141  K 4.1 3.4*  CL 101 103  CO2 26 27  GLUCOSE 122* 69*  BUN 44* 33*  CREATININE 2.29* 1.69*  CALCIUM 9.0 8.0*   GFR: CrCl cannot be calculated (Unknown ideal weight.). Liver Function Tests: Recent Labs  Lab 06/01/19 1406  AST 25  ALT 17  ALKPHOS 101  BILITOT 0.3  PROT 7.1  ALBUMIN 3.5   No results for input(s): LIPASE, AMYLASE in the last 168 hours. No results for input(s): AMMONIA in the last 168 hours. Coagulation Profile: No results for input(s): INR, PROTIME in the last 168 hours. Cardiac Enzymes: No results for input(s): CKTOTAL, CKMB, CKMBINDEX, TROPONINI in the last 168 hours. BNP (last 3 results) No results for input(s): PROBNP in the last 8760 hours. HbA1C: Recent Labs    06/01/19 1418  HGBA1C 9.7*   CBG: Recent Labs  Lab 06/04/19 0225 06/04/19 0324 06/04/19 0434 06/04/19 0539 06/04/19 0639  GLUCAP 131* 229* 229* 221* 215*   Lipid Profile: Recent Labs    06/03/19 0337  CHOL 175  HDL 40*  LDLCALC 112*  TRIG 114  CHOLHDL 4.4   Thyroid Function Tests: No results for input(s): TSH, T4TOTAL, FREET4, T3FREE, THYROIDAB in the last 72 hours. Anemia Panel: No results for input(s): VITAMINB12, FOLATE, FERRITIN, TIBC, IRON, RETICCTPCT in the last 72 hours. Sepsis Labs: No results for input(s): PROCALCITON, LATICACIDVEN in the last 168  hours.  Recent Results (from the past 240 hour(s))  SARS CORONAVIRUS 2 (TAT 6-24 HRS) Nasopharyngeal Nasopharyngeal Swab     Status: None   Collection Time: 06/01/19  3:22 PM   Specimen: Nasopharyngeal Swab  Result Value Ref Range Status   SARS Coronavirus 2 NEGATIVE NEGATIVE Final    Comment: (NOTE) SARS-CoV-2 target nucleic acids are NOT DETECTED. The SARS-CoV-2 RNA is generally detectable in upper and lower respiratory specimens during the acute phase of infection. Negative results do not preclude SARS-CoV-2 infection, do not rule out co-infections with other pathogens, and should not be used as the sole basis for treatment or other patient management decisions. Negative results must be combined with clinical observations, patient history, and epidemiological information. The expected result is Negative. Fact Sheet for Patients: HairSlick.no Fact Sheet for Healthcare Providers: quierodirigir.com This test is not yet approved or cleared by the [place] FDA and  has been authorized for detection and/or diagnosis of SARS-CoV-2 by FDA under an Emergency Use Authorization (EUA). This EUA will remain  in effect (meaning this test can be used) for the duration of the COVID-19 declaration under Section 56 4(b)(1) of the Act, 21 U.S.C. section 360bbb-3(b)(1), unless the authorization is terminated or revoked sooner. Performed  at [facility], [place] [address], [place], [place] [address]          Radiology Studies: EEG  Result Date: 06/02/2019 Lora Havens, MD     06/02/2019 12:19 PM Patient Name: [name] [name] MRN: [id number] Epilepsy Attending: Lora Havens Referring Physician/Provider: Dr. Kerney Elbe Date: 06/02/2019 Duration: 26.18 mins  Patient history: 85 year old female with chronic right and left MCA infarcts who presented with left-sided facial twitching and left-sided weakness, right gaze preference  concerning for seizures which resolved after AED.  Now having frequent facial twitching in setting of hypoglycemia. EEG to evaluate for seizures.  Level of alertness: Awake  AEDs during EEG study: Dilantin  Technical aspects: This EEG study was done with scalp electrodes positioned according to the 10-20 International system of electrode placement. Electrical activity was acquired at a sampling rate of 500Hz  and reviewed with a high frequency filter of 70Hz  and a low frequency filter of 1Hz . EEG data were recorded continuously and digitally stored.  Description: During awake state, no clear posterior dominant rhythm was seen.  EEG showed continuous generalized and lateralized right hemisphere 3 to 6 Hz theta-delta slowing.  Hyperventilation and photic summation were not performed.  Multiple episodes of semi-rhythmic left corner of mouth twitching were noted during which patient was alert and oriented. Concomitant eeg before during and after the episodes didn't show any change to suggest seizure. Abnormality -Continuous slow, generalized and lateralized right hemisphere  IMPRESSION: This study is suggestive of bilateral cortical dysfunction ( right>left)  likely secondary to underlying encephalomalacia as well as mild diffuse encephalopathy, nonspecific etiology. No epileptiform discharges were seen throughout the recording. Multiple episodes of semi-rhythmic left corner of mouth twitching were noted without any EEG change to suggest seizure. However, focal motor seizures may not be seen on scalp eeg. Clinical semiology of the episodes is less likely to be epileptic. Clinical correlation is suggested. Lora Havens   EEG  Result Date: 06/02/2019 Lora Havens, MD     06/02/2019 11:23 AM Patient Name: [name] [name] MRN: [id number] Epilepsy Attending: Lora Havens Referring Physician/Provider: Dr. Kerney Elbe Date: 06/01/2019 Duration: 22.03 minutes Patient history: 85 year old female with  chronic right and left MCA infarcts who presented with left-sided facial twitching and left-sided weakness, right gaze preference concerning for seizures.  EEG to evaluate for seizures. Level of alertness: Awake AEDs during EEG study: Dilantin Technical aspects: This EEG study was done with scalp electrodes positioned according to the 10-20 International system of electrode placement. Electrical activity was acquired at a sampling rate of 500Hz  and reviewed with a high frequency filter of 70Hz  and a low frequency filter of 1Hz . EEG data were recorded continuously and digitally stored. Description: During awake state, no clear posterior dominant rhythm was seen.  EEG showed continuous generalized and lateralized right hemisphere 3 to 6 Hz theta-delta slowing.  Hyperventilation and photic summation were not performed. Of note, EEG was technically limited due to significant motion artifact. Abnormality -Continuous slow, generalized and lateralized right hemisphere IMPRESSION: This technically limited study is suggestive of bilateral cortical dysfunction likely secondary to underlying encephalomalacia as well as mild diffuse encephalopathy, nonspecific etiology. No seizures or epileptiform discharges were seen throughout the recording.   MR ANGIO HEAD WO CONTRAST  Result Date: 06/02/2019 CLINICAL DATA:  Focal neuro deficit greater than 6 hours. Rule out stroke. Atrial fibrillation. EXAM: MRI HEAD WITHOUT CONTRAST MRA HEAD WITHOUT CONTRAST TECHNIQUE: Multiplanar, multiecho pulse sequences of the brain and surrounding  structures were obtained without intravenous contrast. Angiographic images of the head were obtained using MRA technique without contrast. COMPARISON:  MRI head 12/02/2018 FINDINGS: MRI HEAD FINDINGS Brain: Subcentimeter acute infarct right superior cerebellum. No other acute infarct. Chronic hemorrhagic infarct left frontal lobe. Chronic hemorrhagic infarct right parietal lobe. Chronic microvascular  ischemic changes throughout the white matter. Chronic infarct right paracentral pons. Chronic infarct left thalamus. Moderate atrophy. Negative for mass lesion. Vascular: Normal arterial flow voids. Skull and upper cervical spine: Negative Sinuses/Orbits: Paranasal sinuses clear.  Bilateral cataract surgery Other: None MRA HEAD FINDINGS Both vertebral arteries patent to the basilar. Left PICA patent. Right PICA not visualized. Atherosclerotic irregularity in the basilar without stenosis. Moderate to severe stenosis left P2 segment. Right posterior cerebral artery patent. Superior cerebellar arteries patent. Cavernous carotid mildly narrowed due to atherosclerotic disease bilaterally. Moderate to severe stenosis left MCA bifurcation. Moderate stenosis origin of left anterior cerebral artery which is otherwise patent. Moderate stenosis distal right A1 segment. Multiple areas of stenosis right A2. Diffuse irregularity and mild to moderate stenosis right middle cerebral artery extending into the M2 branches. Negative for cerebral aneurysm. IMPRESSION: Subcentimeter acute infarct right superior cerebellum Atrophy and extensive chronic ischemic changes above. Extensive intracranial atherosclerotic disease as above. Electronically Signed   By: Marlan Palau M.D.   On: 06/02/2019 15:33   MR Brain Wo Contrast (neuro protocol)  Result Date: 06/02/2019 CLINICAL DATA:  Focal neuro deficit greater than 6 hours. Rule out stroke. Atrial fibrillation. EXAM: MRI HEAD WITHOUT CONTRAST MRA HEAD WITHOUT CONTRAST TECHNIQUE: Multiplanar, multiecho pulse sequences of the brain and surrounding structures were obtained without intravenous contrast. Angiographic images of the head were obtained using MRA technique without contrast. COMPARISON:  MRI head 12/02/2018 FINDINGS: MRI HEAD FINDINGS Brain: Subcentimeter acute infarct right superior cerebellum. No other acute infarct. Chronic hemorrhagic infarct left frontal lobe. Chronic  hemorrhagic infarct right parietal lobe. Chronic microvascular ischemic changes throughout the white matter. Chronic infarct right paracentral pons. Chronic infarct left thalamus. Moderate atrophy. Negative for mass lesion. Vascular: Normal arterial flow voids. Skull and upper cervical spine: Negative Sinuses/Orbits: Paranasal sinuses clear.  Bilateral cataract surgery Other: None MRA HEAD FINDINGS Both vertebral arteries patent to the basilar. Left PICA patent. Right PICA not visualized. Atherosclerotic irregularity in the basilar without stenosis. Moderate to severe stenosis left P2 segment. Right posterior cerebral artery patent. Superior cerebellar arteries patent. Cavernous carotid mildly narrowed due to atherosclerotic disease bilaterally. Moderate to severe stenosis left MCA bifurcation. Moderate stenosis origin of left anterior cerebral artery which is otherwise patent. Moderate stenosis distal right A1 segment. Multiple areas of stenosis right A2. Diffuse irregularity and mild to moderate stenosis right middle cerebral artery extending into the M2 branches. Negative for cerebral aneurysm. IMPRESSION: Subcentimeter acute infarct right superior cerebellum Atrophy and extensive chronic ischemic changes above. Extensive intracranial atherosclerotic disease as above. Electronically Signed   By: Marlan Palau M.D.   On: 06/02/2019 15:33   DG CHEST PORT 1 VIEW  Result Date: 06/02/2019 CLINICAL DATA:  85 year old female status post PICC placement EXAM: PORTABLE CHEST 1 VIEW COMPARISON:  Chest radiograph dated 03/05/2019 FINDINGS: Right-sided PICC with tip in the region of the cavoatrial junction. There is no focal consolidation, pleural effusion, or pneumothorax. Stable cardiomegaly. Atherosclerotic calcification of the aorta. No acute osseous pathology. IMPRESSION: 1. Right-sided PICC with tip at the cavoatrial junction. 2. No acute cardiopulmonary process. 3. Cardiomegaly. Electronically Signed   By: Elgie Collard M.D.   On: 06/02/2019 21:12  ECHOCARDIOGRAM COMPLETE  Result Date: 06/03/2019   ECHOCARDIOGRAM REPORT   Patient Name:   [name] [name] Date of Exam: 06/03/2019 Medical Rec #:  [id number]        Height:       63.0 in Accession #:    [id number]       Weight:       152.6 lb Date of Birth:  1934-12-17        BSA:          1.72 m Patient Age:    84 years         BP:           149/85 mmHg Patient Gender: F                HR:           52 bpm. Exam Location:  Inpatient Procedure: 2D Echo Indications:    Stroke 434.91/I163.9  History:        Patient has prior history of Echocardiogram examinations, most                 recent 12/02/2018. CAD, Arrythmias:Atrial Fibrillation; Risk                 Factors:Diabetes and Hypertension. Ckd. H/o CVA.  Sonographer:    Ross Ludwig RDCS (AE) Referring Phys: [phone] DAVID R SMITH IMPRESSIONS  1. Left ventricular ejection fraction, by visual estimation, is 40 to 45%. The left ventricle has mildly decreased function. Left ventricular septal wall thickness was mildly increased. Mildly increased left ventricular posterior wall thickness. There is mildly increased left ventricular hypertrophy.  2. Left ventricular diastolic parameters are indeterminate.  3. The left ventricle demonstrates global hypokinesis.  4. Global right ventricle has normal systolic function.The right ventricular size is normal. No increase in right ventricular wall thickness.  5. Left atrial size was severely dilated.  6. Right atrial size was normal.  7. Moderate mitral annular calcification.  8. Moderate thickening of the mitral valve leaflet(s).  9. The mitral valve is normal in structure. Mild mitral valve regurgitation. No evidence of mitral stenosis. 10. The tricuspid valve is normal in structure. 11. The aortic valve is tricuspid. Aortic valve regurgitation is mild. No evidence of aortic valve sclerosis or stenosis. 12. The pulmonic valve was normal in structure. Pulmonic valve regurgitation is not  visualized. 13. Normal pulmonary artery systolic pressure. 14. The inferior vena cava is normal in size with greater than 50% respiratory variability, suggesting right atrial pressure of 3 mmHg. FINDINGS  Left Ventricle: Left ventricular ejection fraction, by visual estimation, is 40 to 45%. The left ventricle has mildly decreased function. The left ventricle demonstrates global hypokinesis. Mildly increased left ventricular posterior wall thickness. There is mildly increased left ventricular hypertrophy. The left ventricular diastology could not be evaluated due to atrial fibrillation. Left ventricular diastolic parameters are indeterminate. Normal left atrial pressure. Right Ventricle: The right ventricular size is normal. No increase in right ventricular wall thickness. Global RV systolic function is has normal systolic function. The tricuspid regurgitant velocity is 2.40 m/s, and with an assumed right atrial pressure  of 3 mmHg, the estimated right ventricular systolic pressure is normal at 25.9 mmHg. Left Atrium: Left atrial size was severely dilated. Right Atrium: Right atrial size was normal in size Pericardium: There is no evidence of pericardial effusion. Mitral Valve: The mitral valve is normal in structure. There is moderate thickening of the mitral valve leaflet(s). Moderate mitral annular calcification. Mild  mitral valve regurgitation. No evidence of mitral valve stenosis by observation. Tricuspid Valve: The tricuspid valve is normal in structure. Tricuspid valve regurgitation is mild. Aortic Valve: The aortic valve is tricuspid. Aortic valve regurgitation is mild. Aortic regurgitation PHT measures 432 msec. The aortic valve is structurally normal, with no evidence of sclerosis or stenosis. Pulmonic Valve: The pulmonic valve was normal in structure. Pulmonic valve regurgitation is not visualized. Pulmonic regurgitation is not visualized. Aorta: The aortic root, ascending aorta and aortic arch are all  structurally normal, with no evidence of dilitation or obstruction. Venous: The inferior vena cava is normal in size with greater than 50% respiratory variability, suggesting right atrial pressure of 3 mmHg. IAS/Shunts: No atrial level shunt detected by color flow Doppler. There is no evidence of a patent foramen ovale. No ventricular septal defect is seen or detected. There is no evidence of an atrial septal defect.  LEFT VENTRICLE PLAX 2D LVIDd:         5.02 cm LVIDs:         4.17 cm LV PW:         1.32 cm LV IVS:        1.23 cm LV SV:         42 ml LV SV Index:   23.76  LV Volumes (MOD) LV area d, A2C:    35.00 cm LV area d, A4C:    33.10 cm LV area s, A2C:    25.70 cm LV area s, A4C:    22.30 cm LV major d, A2C:   7.80 cm LV major d, A4C:   7.14 cm LV major s, A2C:   7.36 cm LV major s, A4C:   6.06 cm LV vol d, MOD A2C: 132.0 ml LV vol d, MOD A4C: 126.0 ml LV vol s, MOD A2C: 76.0 ml LV vol s, MOD A4C: 68.2 ml LV SV MOD A2C:     56.0 ml LV SV MOD A4C:     126.0 ml LV SV MOD BP:      55.3 ml RIGHT VENTRICLE            IVC RV Basal diam:  3.13 cm    IVC diam: 1.62 cm RV S prime:     8.92 cm/s TAPSE (M-mode): 1.6 cm LEFT ATRIUM           Index       RIGHT ATRIUM           Index LA diam:      4.00 cm 2.32 cm/m  RA Area:     19.50 cm LA Vol (A2C): 47.1 ml 27.33 ml/m RA Volume:   49.50 ml  28.72 ml/m LA Vol (A4C): 84.1 ml 48.80 ml/m  AORTIC VALVE LVOT Vmax:   68.40 cm/s LVOT Vmean:  48.375 cm/s LVOT VTI:    0.155 m AI PHT:      432 msec  AORTA Ao Root diam: 3.00 cm Ao Asc diam:  3.10 cm TRICUSPID VALVE TR Peak grad:   22.9 mmHg TR Vmax:        298.00 cm/s  SHUNTS Systemic VTI: 0.15 m  Chilton Si MD Electronically signed by Chilton Si MD Signature Date/Time: 06/03/2019/4:19:06 PM    Final    VAS US CAROTID  Result Date: 06/03/2019 Carotid Arterial Duplex Study Indications:       CVA. Risk Factors:      Hypertension, hyperlipidemia, Diabetes. Limitations        Today's exam was limited  due to the  high bifurcation of the                    carotid, the patient's inability or unwillingness to                    cooperate and tortuous ICA's, vocal interference due to                    snoring. Comparison Study:  Prior carotid exam done 12-02-18 Performing Technologist: Kennedy Bucker ARDMS, RVT  Examination Guidelines: A complete evaluation includes B-mode imaging, spectral Doppler, color Doppler, and power Doppler as needed of all accessible portions of each vessel. Bilateral testing is considered an integral part of a complete examination. Limited examinations for reoccurring indications may be performed as noted.  Right Carotid Findings: +----------+--------+--------+--------+-------------------------+---------+           PSV cm/sEDV cm/sStenosisPlaque Description       Comments  +----------+--------+--------+--------+-------------------------+---------+ CCA Prox  45      12                                                 +----------+--------+--------+--------+-------------------------+---------+ CCA Distal44      8                                                  +----------+--------+--------+--------+-------------------------+---------+ ICA Prox  125     27      1-39%   calcific and heterogenousShadowing +----------+--------+--------+--------+-------------------------+---------+ ICA Distal109     15                                                 +----------+--------+--------+--------+-------------------------+---------+ ECA       93      10                                                 +----------+--------+--------+--------+-------------------------+---------+ +----------+--------+-------+----------------+-------------------+           PSV cm/sEDV cmsDescribe        Arm Pressure (mmHG) +----------+--------+-------+----------------+-------------------+ [id number]     0      Multiphasic, WNL                     +----------+--------+-------+----------------+-------------------+ +---------+--------+--+--------+--+---------+ VertebralPSV cm/s38EDV cm/s10Antegrade +---------+--------+--+--------+--+---------+  Left Carotid Findings: +----------+--------+--------+--------+------------------+---------+           PSV cm/sEDV cm/sStenosisPlaque DescriptionComments  +----------+--------+--------+--------+------------------+---------+ CCA Prox  57      7                                           +----------+--------+--------+--------+------------------+---------+ CCA Distal51      13                                          +----------+--------+--------+--------+------------------+---------+  ICA Prox  111     23      1-39%   calcific          Shadowing +----------+--------+--------+--------+------------------+---------+ ICA Distal86      16                                          +----------+--------+--------+--------+------------------+---------+ ECA       75      19                                          +----------+--------+--------+--------+------------------+---------+ +----------+--------+--------+----------------+-------------------+           PSV cm/sEDV cm/sDescribe        Arm Pressure (mmHG) +----------+--------+--------+----------------+-------------------+ [id number]             Multiphasic, WNL                    +----------+--------+--------+----------------+-------------------+ +---------+--------+--+--------+--+---------+ VertebralPSV cm/s59EDV cm/s13Antegrade +---------+--------+--+--------+--+---------+  Summary: Right Carotid: Velocities in the right ICA are consistent with a 1-39% stenosis. Left Carotid: Velocities in the left ICA are consistent with a 1-39% stenosis.               Limited exam due to patient cooperation, high bifurcations,               tortuous ICAs, and vocal interference due to snoring. Vertebrals:  Bilateral vertebral  arteries demonstrate antegrade flow. Subclavians: Normal flow hemodynamics were seen in bilateral subclavian              arteries. *See table(s) above for measurements and observations.     Preliminary    [place] EKG SITE RITE  Result Date: 06/02/2019 If Site Rite image not attached, placement could not be confirmed due to current cardiac rhythm.       Scheduled Meds: . aspirin  300 mg Rectal Daily  . atorvastatin  20 mg Oral q1800  . Chlorhexidine Gluconate Cloth  6 each Topical Daily  . docusate sodium  100 mg Oral BID  . enoxaparin (LOVENOX) injection  40 mg Subcutaneous Q24H  . LORazepam  1 mg Intravenous Once  . pantoprazole (PROTONIX) IV  40 mg Intravenous Q24H  . sertraline  50 mg Oral Daily  . sodium chloride flush  10-40 mL Intracatheter Q12H  . sodium chloride flush  3 mL Intravenous Q12H   Continuous Infusions: . sodium chloride    . cefTRIAXone (ROCEPHIN)  IV 1 g (06/03/19 1104)  . phenytoin (DILANTIN) IV 200 mg (06/04/19 0013)          Glade Lloyd, MD [facility] 06/04/2019, 7:43 AM

## 2019-06-04 NOTE — Progress Notes (Signed)
All night time medications were administered to patient before discharging; pt was clean and dry with vital signs WDL.  Patient reported no pain at the time of discharge.  Patient was transported via [facility] and all patient belongings were sent with them.

## 2019-06-04 NOTE — Discharge Summary (Signed)
Physician Discharge Summary  [name] [name]:[id number] DOB: 04-Jun-1934 DOA: 06/01/2019  PCP: [name] Proffer, MD  Admit date: 06/01/2019 Discharge date: 06/04/2019  Admitted From: Home Disposition: Home  Recommendations for Outpatient Follow-up:  1. Follow up with SNF provider at earliest convenience 2. Outpatient follow-up with neurology 3. Outpatient follow-up with palliative care 4. Follow up in ED if symptoms worsen or new appear   Home Health: No Equipment/Devices: None  Discharge Condition: Guarded to poor CODE STATUS: Full Diet recommendation: Heart healthy/carb modified/diet as per SLP recommendations  Brief/Interim Summary: 86 year old female with history of CAD status post stent to RCA, poorly controlled diabetes mellitus type 2, chronic renal disease stage III, hypothyroidism, paroxysmal A. fib with history of cardioversion, unspecified CVA in July 2020 presented with altered mental status, weakness more on the left side with facial twitching and slurred speech.  CT of the head was negative for any acute intracranial abnormality.  Neurology was consulted.  MRI of the brain showed acute right cerebellar infarct.  Neurology recommended to continue aspirin, Plavix and statin.  Outpatient follow-up with neurology.  Neurology also recommended Dilantin twice a day.  PT recommended SNF.  She will be discharged to SNF once bed is available.   Discharge Diagnoses:   Acute right cerebellar infarct Acute metabolic encephalopathy -Presented with altered mental status, weakness more on the left side with facial twitching and slurred speech -CT of the head was negative for acute infarct.  MRI of the brain showed acute right cerebellar infarct. -Neurology evaluation appreciated.  Initially there was a concern for seizure as well.  Patient was loaded with fosphenytoin.  EEG did not show any epileptiform discharges. -Continue monitoring mental status.  Currently awake but hardly  answers any questions -PT/OT recommend SNF placement. -SLP eval -A1c 9.7.  LDL 112. -Neurology has signed off with recommendations: Continue aspirin, Plavix and statin.    Will increase Lipitor to 80 mg daily.  TTE revealed no mural thrombus.  Carotid ultrasound without severe stenosis.  Because patient is a fall risk currently as per PT, neurology recommends to hold off on anticoagulation for now.  Outpatient follow-up with neurology  Question of seizures -Currently on Dilantin 200 twice a day as per neurology.  Will need outpatient neurology follow-up  paroxysmal atrial fibrillation -Status post cardioversion in 2015 per chart -Was not on anticoagulation prior to this hospitalization for unknown reason -Previously was on Eliquis but did not take it because of cost.  Subsequently, during her last hospitalization in July thousand 20, she was discharged on Coumadin and Lovenox. -Anticoagulation recommendation as above  Acute kidney injury on chronic kidney disease stage IIIb -Baseline creatinine around 1.4-1.5 -Presented with creatinine of 2.29.  Improving to 1.67 today.    Outpatient follow-up.  Probable UTI: Present on admission -Culture negative so far.  Finished 3-day course of Rocephin today  Hypomagnesemia -Replaced  Diabetes mellitus type 2 with hypoglycemia and hyperglycemia -Continue CBGs.  A1c 9.7. -Carb modified diet.  Continue home regimen.  Hypertension - Allow for permissive hypertension for 3 to 4 days.  Outpatient follow-up  Generalized deconditioning -Overall prognosis is guarded to poor.  Palliative care consultation evaluation appreciated.  Patient will need outpatient follow-up with palliative care.  Discharge Instructions   Allergies as of 06/04/2019      Reactions   Oxycodone Nausea And Vomiting      Medication List    STOP taking these medications   aspirin EC 81 MG tablet Replaced by: aspirin 325 MG  tablet   potassium chloride SA 20 MEQ  tablet Commonly known as: KLOR-CON     TAKE these medications   albuterol 108 (90 Base) MCG/ACT inhaler Commonly known as: VENTOLIN HFA Inhale 2 puffs into the lungs every 6 (six) hours as needed for wheezing or shortness of breath.   amiodarone 200 MG tablet Commonly known as: PACERONE Take 1 tablet (200 mg total) by mouth daily.   aspirin 325 MG tablet Take 1 tablet (325 mg total) by mouth daily. Start taking on: June 05, 2019 Replaces: aspirin EC 81 MG tablet   atorvastatin 80 MG tablet Commonly known as: LIPITOR Take 1 tablet (80 mg total) by mouth daily at 6 PM. What changed:   medication strength  how much to take  when to take this   clopidogrel 75 MG tablet Commonly known as: PLAVIX Take 1 tablet (75 mg total) by mouth daily. Start taking on: June 05, 2019   ferrous sulfate 325 (65 FE) MG tablet Take 325 mg by mouth daily with breakfast.   insulin lispro 100 UNIT/ML injection Commonly known as: HUMALOG Inject 2-10 Units into the skin 3 (three) times daily before meals. Per sliding scale. If sugar is is >200 is 2units and if over 400 is 10 units   latanoprost 0.005 % ophthalmic solution Commonly known as: XALATAN Place 1 drop into both eyes at bedtime.   levothyroxine 150 MCG tablet Commonly known as: SYNTHROID Take 1 tablet (150 mcg total) by mouth daily before breakfast.   meclizine 12.5 MG tablet Commonly known as: ANTIVERT Take 12.5 mg by mouth 2 (two) times daily as needed for dizziness.   metFORMIN 500 MG tablet Commonly known as: GLUCOPHAGE Take 500 mg by mouth 2 (two) times daily with a meal.   nitroGLYCERIN 0.4 MG SL tablet Commonly known as: NITROSTAT Place 0.4 mg under the tongue every 5 (five) minutes as needed for chest pain.   omeprazole 20 MG capsule Commonly known as: PriLOSEC Take 1 capsule (20 mg total) by mouth daily for 5000 doses.   phenytoin 200 MG ER capsule Commonly known as: DILANTIN Take 1 capsule (200 mg total)  by mouth 2 (two) times daily.   senna 8.6 MG Tabs tablet Commonly known as: SENOKOT Take 1 tablet (8.6 mg total) by mouth at bedtime as needed for mild constipation.   sertraline 50 MG tablet Commonly known as: ZOLOFT Take 1 tablet (50 mg total) by mouth daily.   [name] Bury FlexTouch 100 UNIT/ML Sopn FlexTouch Pen Generic drug: insulin degludec Inject 18 Units into the skin daily.        Allergies  Allergen Reactions  . Oxycodone Nausea And Vomiting    Consultations:  Neurology/palliative care   Procedures/Studies: EEG  Result Date: 06/02/2019 Charlsie Quest, MD     06/02/2019 12:19 PM Patient Name: [name] [name] MRN: [id number] Epilepsy Attending: Charlsie Quest Referring Physician/Provider: Dr. Caryl Pina Date: 06/02/2019 Duration: 26.18 mins  Patient history: 85 year old female with chronic right and left MCA infarcts who presented with left-sided facial twitching and left-sided weakness, right gaze preference concerning for seizures which resolved after AED.  Now having frequent facial twitching in setting of hypoglycemia. EEG to evaluate for seizures.  Level of alertness: Awake  AEDs during EEG study: Dilantin  Technical aspects: This EEG study was done with scalp electrodes positioned according to the 10-20 International system of electrode placement. Electrical activity was acquired at a sampling rate of  and reviewed with a high frequency filter  of 70Hz  and a low frequency filter of 1Hz . EEG data were recorded continuously and digitally stored.  Description: During awake state, no clear posterior dominant rhythm was seen.  EEG showed continuous generalized and lateralized right hemisphere 3 to 6 Hz theta-delta slowing.  Hyperventilation and photic summation were not performed.  Multiple episodes of semi-rhythmic left corner of mouth twitching were noted during which patient was alert and oriented. Concomitant eeg before during and after the episodes didn't show  any change to suggest seizure. Abnormality -Continuous slow, generalized and lateralized right hemisphere  IMPRESSION: This study is suggestive of bilateral cortical dysfunction ( right>left)  likely secondary to underlying encephalomalacia as well as mild diffuse encephalopathy, nonspecific etiology. No epileptiform discharges were seen throughout the recording. Multiple episodes of semi-rhythmic left corner of mouth twitching were noted without any EEG change to suggest seizure. However, focal motor seizures may not be seen on scalp eeg. Clinical semiology of the episodes is less likely to be epileptic. Clinical correlation is suggested. Lora Havens   EEG  Result Date: 06/02/2019 Lora Havens, MD     06/02/2019 11:23 AM Patient Name: [name] [name] MRN: [id number] Epilepsy Attending: Lora Havens Referring Physician/Provider: Dr. Kerney Elbe Date: 06/01/2019 Duration: 22.03 minutes Patient history: 85 year old female with chronic right and left MCA infarcts who presented with left-sided facial twitching and left-sided weakness, right gaze preference concerning for seizures.  EEG to evaluate for seizures. Level of alertness: Awake AEDs during EEG study: Dilantin Technical aspects: This EEG study was done with scalp electrodes positioned according to the 10-20 International system of electrode placement. Electrical activity was acquired at a sampling rate of 500Hz  and reviewed with a high frequency filter of 70Hz  and a low frequency filter of 1Hz . EEG data were recorded continuously and digitally stored. Description: During awake state, no clear posterior dominant rhythm was seen.  EEG showed continuous generalized and lateralized right hemisphere 3 to 6 Hz theta-delta slowing.  Hyperventilation and photic summation were not performed. Of note, EEG was technically limited due to significant motion artifact. Abnormality -Continuous slow, generalized and lateralized right hemisphere IMPRESSION:  This technically limited study is suggestive of bilateral cortical dysfunction likely secondary to underlying encephalomalacia as well as mild diffuse encephalopathy, nonspecific etiology. No seizures or epileptiform discharges were seen throughout the recording.   CT Head Wo Contrast  Result Date: 06/01/2019 CLINICAL DATA:  Slurred speech left-sided facial droop EXAM: CT HEAD WITHOUT CONTRAST TECHNIQUE: Contiguous axial images were obtained from the base of the skull through the vertex without intravenous contrast. COMPARISON:  CT head 03/06/2019 FINDINGS: Brain: Generalized atrophy. Chronic infarct left frontal lobe and chronic infarct right frontal parietal lobe unchanged. Chronic infarct left thalamus. Negative for acute infarct, hemorrhage, mass. Vascular: Atherosclerotic calcification in the carotid and vertebral arteries. Negative for hyperdense vessel Skull: Negative Sinuses/Orbits: Paranasal sinuses clear.  Bilateral cataract surgery Other: None IMPRESSION: Atrophy and chronic ischemic changes stable from prior study. No acute abnormality Electronically Signed   By: Franchot Gallo M.D.   On: 06/01/2019 14:41   MR ANGIO HEAD WO CONTRAST  Result Date: 06/02/2019 CLINICAL DATA:  Focal neuro deficit greater than 6 hours. Rule out stroke. Atrial fibrillation. EXAM: MRI HEAD WITHOUT CONTRAST MRA HEAD WITHOUT CONTRAST TECHNIQUE: Multiplanar, multiecho pulse sequences of the brain and surrounding structures were obtained without intravenous contrast. Angiographic images of the head were obtained using MRA technique without contrast. COMPARISON:  MRI head 12/02/2018 FINDINGS: MRI HEAD FINDINGS Brain: Subcentimeter acute infarct  right superior cerebellum. No other acute infarct. Chronic hemorrhagic infarct left frontal lobe. Chronic hemorrhagic infarct right parietal lobe. Chronic microvascular ischemic changes throughout the white matter. Chronic infarct right paracentral pons. Chronic infarct left thalamus.  Moderate atrophy. Negative for mass lesion. Vascular: Normal arterial flow voids. Skull and upper cervical spine: Negative Sinuses/Orbits: Paranasal sinuses clear.  Bilateral cataract surgery Other: None MRA HEAD FINDINGS Both vertebral arteries patent to the basilar. Left PICA patent. Right PICA not visualized. Atherosclerotic irregularity in the basilar without stenosis. Moderate to severe stenosis left P2 segment. Right posterior cerebral artery patent. Superior cerebellar arteries patent. Cavernous carotid mildly narrowed due to atherosclerotic disease bilaterally. Moderate to severe stenosis left MCA bifurcation. Moderate stenosis origin of left anterior cerebral artery which is otherwise patent. Moderate stenosis distal right A1 segment. Multiple areas of stenosis right A2. Diffuse irregularity and mild to moderate stenosis right middle cerebral artery extending into the M2 branches. Negative for cerebral aneurysm. IMPRESSION: Subcentimeter acute infarct right superior cerebellum Atrophy and extensive chronic ischemic changes above. Extensive intracranial atherosclerotic disease as above. Electronically Signed   By: Marlan Palau M.D.   On: 06/02/2019 15:33   MR Brain Wo Contrast (neuro protocol)  Result Date: 06/02/2019 CLINICAL DATA:  Focal neuro deficit greater than 6 hours. Rule out stroke. Atrial fibrillation. EXAM: MRI HEAD WITHOUT CONTRAST MRA HEAD WITHOUT CONTRAST TECHNIQUE: Multiplanar, multiecho pulse sequences of the brain and surrounding structures were obtained without intravenous contrast. Angiographic images of the head were obtained using MRA technique without contrast. COMPARISON:  MRI head 12/02/2018 FINDINGS: MRI HEAD FINDINGS Brain: Subcentimeter acute infarct right superior cerebellum. No other acute infarct. Chronic hemorrhagic infarct left frontal lobe. Chronic hemorrhagic infarct right parietal lobe. Chronic microvascular ischemic changes throughout the white matter. Chronic  infarct right paracentral pons. Chronic infarct left thalamus. Moderate atrophy. Negative for mass lesion. Vascular: Normal arterial flow voids. Skull and upper cervical spine: Negative Sinuses/Orbits: Paranasal sinuses clear.  Bilateral cataract surgery Other: None MRA HEAD FINDINGS Both vertebral arteries patent to the basilar. Left PICA patent. Right PICA not visualized. Atherosclerotic irregularity in the basilar without stenosis. Moderate to severe stenosis left P2 segment. Right posterior cerebral artery patent. Superior cerebellar arteries patent. Cavernous carotid mildly narrowed due to atherosclerotic disease bilaterally. Moderate to severe stenosis left MCA bifurcation. Moderate stenosis origin of left anterior cerebral artery which is otherwise patent. Moderate stenosis distal right A1 segment. Multiple areas of stenosis right A2. Diffuse irregularity and mild to moderate stenosis right middle cerebral artery extending into the M2 branches. Negative for cerebral aneurysm. IMPRESSION: Subcentimeter acute infarct right superior cerebellum Atrophy and extensive chronic ischemic changes above. Extensive intracranial atherosclerotic disease as above. Electronically Signed   By: Marlan Palau M.D.   On: 06/02/2019 15:33   DG CHEST PORT 1 VIEW  Result Date: 06/02/2019 CLINICAL DATA:  85 year old female status post PICC placement EXAM: PORTABLE CHEST 1 VIEW COMPARISON:  Chest radiograph dated 03/05/2019 FINDINGS: Right-sided PICC with tip in the region of the cavoatrial junction. There is no focal consolidation, pleural effusion, or pneumothorax. Stable cardiomegaly. Atherosclerotic calcification of the aorta. No acute osseous pathology. IMPRESSION: 1. Right-sided PICC with tip at the cavoatrial junction. 2. No acute cardiopulmonary process. 3. Cardiomegaly. Electronically Signed   By: Elgie Collard M.D.   On: 06/02/2019 21:12   ECHOCARDIOGRAM COMPLETE  Result Date: 06/03/2019   ECHOCARDIOGRAM REPORT    Patient Name:   [name] [name] Date of Exam: 06/03/2019 Medical Rec #:  [id number]  Height:       63.0 in Accession #:    [id number]       Weight:       152.6 lb Date of Birth:  25-Feb-1935        BSA:          1.72 m Patient Age:    84 years         BP:           149/85 mmHg Patient Gender: F                HR:           52 bpm. Exam Location:  Inpatient Procedure: 2D Echo Indications:    Stroke 434.91/I163.9  History:        Patient has prior history of Echocardiogram examinations, most                 recent 12/02/2018. CAD, Arrythmias:Atrial Fibrillation; Risk                 Factors:Diabetes and Hypertension. Ckd. H/o CVA.  Sonographer:    Ross Ludwig RDCS (AE) Referring Phys: [phone] DAVID R SMITH IMPRESSIONS  1. Left ventricular ejection fraction, by visual estimation, is 40 to 45%. The left ventricle has mildly decreased function. Left ventricular septal wall thickness was mildly increased. Mildly increased left ventricular posterior wall thickness. There is mildly increased left ventricular hypertrophy.  2. Left ventricular diastolic parameters are indeterminate.  3. The left ventricle demonstrates global hypokinesis.  4. Global right ventricle has normal systolic function.The right ventricular size is normal. No increase in right ventricular wall thickness.  5. Left atrial size was severely dilated.  6. Right atrial size was normal.  7. Moderate mitral annular calcification.  8. Moderate thickening of the mitral valve leaflet(s).  9. The mitral valve is normal in structure. Mild mitral valve regurgitation. No evidence of mitral stenosis. 10. The tricuspid valve is normal in structure. 11. The aortic valve is tricuspid. Aortic valve regurgitation is mild. No evidence of aortic valve sclerosis or stenosis. 12. The pulmonic valve was normal in structure. Pulmonic valve regurgitation is not visualized. 13. Normal pulmonary artery systolic pressure. 14. The inferior vena cava is normal in size with greater  than 50% respiratory variability, suggesting right atrial pressure of 3 mmHg. FINDINGS  Left Ventricle: Left ventricular ejection fraction, by visual estimation, is 40 to 45%. The left ventricle has mildly decreased function. The left ventricle demonstrates global hypokinesis. Mildly increased left ventricular posterior wall thickness. There is mildly increased left ventricular hypertrophy. The left ventricular diastology could not be evaluated due to atrial fibrillation. Left ventricular diastolic parameters are indeterminate. Normal left atrial pressure. Right Ventricle: The right ventricular size is normal. No increase in right ventricular wall thickness. Global RV systolic function is has normal systolic function. The tricuspid regurgitant velocity is 2.40 m/s, and with an assumed right atrial pressure  of 3 mmHg, the estimated right ventricular systolic pressure is normal at 25.9 mmHg. Left Atrium: Left atrial size was severely dilated. Right Atrium: Right atrial size was normal in size Pericardium: There is no evidence of pericardial effusion. Mitral Valve: The mitral valve is normal in structure. There is moderate thickening of the mitral valve leaflet(s). Moderate mitral annular calcification. Mild mitral valve regurgitation. No evidence of mitral valve stenosis by observation. Tricuspid Valve: The tricuspid valve is normal in structure. Tricuspid valve regurgitation is mild. Aortic Valve: The aortic valve is tricuspid. Aortic valve regurgitation  is mild. Aortic regurgitation PHT measures 432 msec. The aortic valve is structurally normal, with no evidence of sclerosis or stenosis. Pulmonic Valve: The pulmonic valve was normal in structure. Pulmonic valve regurgitation is not visualized. Pulmonic regurgitation is not visualized. Aorta: The aortic root, ascending aorta and aortic arch are all structurally normal, with no evidence of dilitation or obstruction. Venous: The inferior vena cava is normal in size  with greater than 50% respiratory variability, suggesting right atrial pressure of 3 mmHg. IAS/Shunts: No atrial level shunt detected by color flow Doppler. There is no evidence of a patent foramen ovale. No ventricular septal defect is seen or detected. There is no evidence of an atrial septal defect.  LEFT VENTRICLE PLAX 2D LVIDd:         5.02 cm LVIDs:         4.17 cm LV PW:         1.32 cm LV IVS:        1.23 cm LV SV:         42 ml LV SV Index:   23.76  LV Volumes (MOD) LV area d, A2C:    35.00 cm LV area d, A4C:    33.10 cm LV area s, A2C:    25.70 cm LV area s, A4C:    22.30 cm LV major d, A2C:   7.80 cm LV major d, A4C:   7.14 cm LV major s, A2C:   7.36 cm LV major s, A4C:   6.06 cm LV vol d, MOD A2C: 132.0 ml LV vol d, MOD A4C: 126.0 ml LV vol s, MOD A2C: 76.0 ml LV vol s, MOD A4C: 68.2 ml LV SV MOD A2C:     56.0 ml LV SV MOD A4C:     126.0 ml LV SV MOD BP:      55.3 ml RIGHT VENTRICLE            IVC RV Basal diam:  3.13 cm    IVC diam: 1.62 cm RV S prime:     8.92 cm/s TAPSE (M-mode): 1.6 cm LEFT ATRIUM           Index       RIGHT ATRIUM           Index LA diam:      4.00 cm 2.32 cm/m  RA Area:     19.50 cm LA Vol (A2C): 47.1 ml 27.33 ml/m RA Volume:   49.50 ml  28.72 ml/m LA Vol (A4C): 84.1 ml 48.80 ml/m  AORTIC VALVE LVOT Vmax:   68.40 cm/s LVOT Vmean:  48.375 cm/s LVOT VTI:    0.155 m AI PHT:      432 msec  AORTA Ao Root diam: 3.00 cm Ao Asc diam:  3.10 cm TRICUSPID VALVE TR Peak grad:   22.9 mmHg TR Vmax:        298.00 cm/s  SHUNTS Systemic VTI: 0.15 m  Chilton Si MD Electronically signed by Chilton Si MD Signature Date/Time: 06/03/2019/4:19:06 PM    Final    DG Hip Unilat With Pelvis 2-3 Views Left  Result Date: 06/01/2019 CLINICAL DATA:  Found by son seated in chair altered with slurred speech and left-sided facial droop EXAM: DG HIP (WITH OR WITHOUT PELVIS) 2-3V LEFT COMPARISON:  Radiograph 12/01/2018 FINDINGS: The osseous structures appear diffusely demineralized which may  limit detection of small or nondisplaced fractures. Periprosthetic lucency involving the right hip hemiarthroplasty is similar to comparison radiographs and therefore nonspecific though loosening  cannot be fully excluded. Moderate left hip arthrosis. Bilateral SI joint degenerative change as well as additional degenerative features in the lower lumbar spine. The sacrum is poorly assessed given extensive overlying bowel gas. Vascular calcium noted in the medial thighs and pelvis. IMPRESSION: 1. The osseous structures appear diffusely demineralized which may limit detection of small or nondisplaced fractures. 2. Periprosthetic lucency involving the right hip hemiarthroplasty is similar to comparison radiographs and therefore nonspecific though loosening cannot be fully excluded. Electronically Signed   By: Kreg Shropshire M.D.   On: 06/01/2019 15:56   VAS US CAROTID  Result Date: 06/03/2019 Carotid Arterial Duplex Study Indications:       CVA. Risk Factors:      Hypertension, hyperlipidemia, Diabetes. Limitations        Today's exam was limited due to the high bifurcation of the                    carotid, the patient's inability or unwillingness to                    cooperate and tortuous ICA's, vocal interference due to                    snoring. Comparison Study:  Prior carotid exam done 12-02-18 Performing Technologist: Kennedy Bucker ARDMS, RVT  Examination Guidelines: A complete evaluation includes B-mode imaging, spectral Doppler, color Doppler, and power Doppler as needed of all accessible portions of each vessel. Bilateral testing is considered an integral part of a complete examination. Limited examinations for reoccurring indications may be performed as noted.  Right Carotid Findings: +----------+--------+--------+--------+-------------------------+---------+           PSV cm/sEDV cm/sStenosisPlaque Description       Comments   +----------+--------+--------+--------+-------------------------+---------+ CCA Prox  45      12                                                 +----------+--------+--------+--------+-------------------------+---------+ CCA Distal44      8                                                  +----------+--------+--------+--------+-------------------------+---------+ ICA Prox  125     27      1-39%   calcific and heterogenousShadowing +----------+--------+--------+--------+-------------------------+---------+ ICA Distal109     15                                                 +----------+--------+--------+--------+-------------------------+---------+ ECA       93      10                                                 +----------+--------+--------+--------+-------------------------+---------+ +----------+--------+-------+----------------+-------------------+           PSV cm/sEDV cmsDescribe        Arm Pressure (mmHG) +----------+--------+-------+----------------+-------------------+ [id number]     0  Multiphasic, WNL                    +----------+--------+-------+----------------+-------------------+ +---------+--------+--+--------+--+---------+ VertebralPSV cm/s38EDV cm/s10Antegrade +---------+--------+--+--------+--+---------+  Left Carotid Findings: +----------+--------+--------+--------+------------------+---------+           PSV cm/sEDV cm/sStenosisPlaque DescriptionComments  +----------+--------+--------+--------+------------------+---------+ CCA Prox  57      7                                           +----------+--------+--------+--------+------------------+---------+ CCA Distal51      13                                          +----------+--------+--------+--------+------------------+---------+ ICA Prox  111     23      1-39%   calcific          Shadowing  +----------+--------+--------+--------+------------------+---------+ ICA Distal86      16                                          +----------+--------+--------+--------+------------------+---------+ ECA       75      19                                          +----------+--------+--------+--------+------------------+---------+ +----------+--------+--------+----------------+-------------------+           PSV cm/sEDV cm/sDescribe        Arm Pressure (mmHG) +----------+--------+--------+----------------+-------------------+ [id number]             Multiphasic, WNL                    +----------+--------+--------+----------------+-------------------+ +---------+--------+--+--------+--+---------+ VertebralPSV cm/s59EDV cm/s13Antegrade +---------+--------+--+--------+--+---------+  Summary: Right Carotid: Velocities in the right ICA are consistent with a 1-39% stenosis. Left Carotid: Velocities in the left ICA are consistent with a 1-39% stenosis.               Limited exam due to patient cooperation, high bifurcations,               tortuous ICAs, and vocal interference due to snoring. Vertebrals:  Bilateral vertebral arteries demonstrate antegrade flow. Subclavians: Normal flow hemodynamics were seen in bilateral subclavian              arteries. *See table(s) above for measurements and observations.     Preliminary    [place] EKG SITE RITE  Result Date: 06/02/2019 If Site Rite image not attached, placement could not be confirmed due to current cardiac rhythm.      Subjective: Patient seen and examined at bedside.  Very poor historian.  Awake but hardly answers any questions.  No overnight fever, vomiting or seizures reported by nursing staff.  Discharge Exam: Vitals:   06/04/19 0736 06/04/19 1144  BP: (!) 175/64 (!) 148/89  Pulse: 66 75  Resp: 16 12  Temp: 97.6 F (36.4 C) 98 F (36.7 C)  SpO2: 100% 100%    General exam: Very poor historian.  Elderly female lying  in bed.  Looks chronically ill.  Awake but hardly answers any questions Respiratory system: Bilateral decreased breath sounds at bases  with some scattered crackles Cardiovascular system: S1 & S2 heard, currently rate controlled Gastrointestinal system: Abdomen is nondistended, soft and nontender. Normal bowel sounds heard. Extremities: No cyanosis, edema    The results of significant diagnostics from this hospitalization (including imaging, microbiology, ancillary and laboratory) are listed below for reference.     Microbiology: Recent Results (from the past 240 hour(s))  SARS CORONAVIRUS 2 (TAT 6-24 HRS) Nasopharyngeal Nasopharyngeal Swab     Status: None   Collection Time: 06/01/19  3:22 PM   Specimen: Nasopharyngeal Swab  Result Value Ref Range Status   SARS Coronavirus 2 NEGATIVE NEGATIVE Final    Comment: (NOTE) SARS-CoV-2 target nucleic acids are NOT DETECTED. The SARS-CoV-2 RNA is generally detectable in upper and lower respiratory specimens during the acute phase of infection. Negative results do not preclude SARS-CoV-2 infection, do not rule out co-infections with other pathogens, and should not be used as the sole basis for treatment or other patient management decisions. Negative results must be combined with clinical observations, patient history, and epidemiological information. The expected result is Negative. Fact Sheet for Patients: HairSlick.no Fact Sheet for Healthcare Providers: quierodirigir.com This test is not yet approved or cleared by the [place] FDA and  has been authorized for detection and/or diagnosis of SARS-CoV-2 by FDA under an Emergency Use Authorization (EUA). This EUA will remain  in effect (meaning this test can be used) for the duration of the COVID-19 declaration under Section 56 4(b)(1) of the Act, 21 U.S.C. section 360bbb-3(b)(1), unless the authorization is terminated or revoked  sooner. Performed at [facility], [address], [place], [place] [address]      Labs: BNP (last 3 results) No results for input(s): BNP in the last 8760 hours. Basic Metabolic Panel: Recent Labs  Lab 06/01/19 1406 06/03/19 0337 06/04/19 0646  NA 140 141 137  K 4.1 3.4* 3.6  CL 101 103 104  CO2 GLUCOSE 122* 69* 245*  BUN 44* 33* 26*  CREATININE 2.29* 1.69* 1.67*  CALCIUM 9.0 8.0* 8.2*  MG  --   --  1.5*   Liver Function Tests: Recent Labs  Lab 06/01/19 1406  AST 25  ALT 17  ALKPHOS 101  BILITOT 0.3  PROT 7.1  ALBUMIN 3.5   No results for input(s): LIPASE, AMYLASE in the last 168 hours. No results for input(s): AMMONIA in the last 168 hours. CBC: Recent Labs  Lab 06/01/19 1406 06/03/19 0337 06/04/19 0646  WBC 12.3* 7.1 7.5  NEUTROABS 10.0*  --  5.4  HGB 13.7 11.6* 11.7*  HCT 43.3 35.9* 36.5  MCV 95.0 93.7 92.4  PLT 338 252 236   Cardiac Enzymes: No results for input(s): CKTOTAL, CKMB, CKMBINDEX, TROPONINI in the last 168 hours. BNP: Invalid input(s): POCBNP CBG: Recent Labs  Lab 06/04/19 0434 06/04/19 0539 06/04/19 0639 06/04/19 0756 06/04/19 1139  GLUCAP 229* 221* 215* 216* 275*   D-Dimer No results for input(s): DDIMER in the last 72 hours. Hgb A1c Recent Labs    06/01/19 1418  HGBA1C 9.7*   Lipid Profile Recent Labs    06/03/19 0337  CHOL 175  HDL 40*  LDLCALC 112*  TRIG 114  CHOLHDL 4.4   Thyroid function studies No results for input(s): TSH, T4TOTAL, T3FREE, THYROIDAB in the last 72 hours.  Invalid input(s): FREET3 Anemia work up No results for input(s): VITAMINB12, FOLATE, FERRITIN, TIBC, IRON, RETICCTPCT in the last 72 hours. Urinalysis    Component Value Date/Time  COLORURINE YELLOW 06/01/2019 1600   APPEARANCEUR HAZY (A) 06/01/2019 1600   APPEARANCEUR Hazy 05/16/2014 1433   LABSPEC 1.013 06/01/2019 1600   LABSPEC 1.006 05/16/2014 1433   PHURINE 5.0 06/01/2019 1600   GLUCOSEU NEGATIVE  06/01/2019 1600   GLUCOSEU Negative 05/16/2014 1433   HGBUR SMALL (A) 06/01/2019 1600   BILIRUBINUR NEGATIVE 06/01/2019 1600   BILIRUBINUR Negative 05/16/2014 1433   KETONESUR NEGATIVE 06/01/2019 1600   PROTEINUR 100 (A) 06/01/2019 1600   UROBILINOGEN 0.2 06/25/2014 1725   NITRITE POSITIVE (A) 06/01/2019 1600   LEUKOCYTESUR SMALL (A) 06/01/2019 1600   LEUKOCYTESUR Negative 05/16/2014 1433   Sepsis Labs Invalid input(s): PROCALCITONIN,  WBC,  LACTICIDVEN Microbiology Recent Results (from the past 240 hour(s))  SARS CORONAVIRUS 2 (TAT 6-24 HRS) Nasopharyngeal Nasopharyngeal Swab     Status: None   Collection Time: 06/01/19  3:22 PM   Specimen: Nasopharyngeal Swab  Result Value Ref Range Status   SARS Coronavirus 2 NEGATIVE NEGATIVE Final    Comment: (NOTE) SARS-CoV-2 target nucleic acids are NOT DETECTED. The SARS-CoV-2 RNA is generally detectable in upper and lower respiratory specimens during the acute phase of infection. Negative results do not preclude SARS-CoV-2 infection, do not rule out co-infections with other pathogens, and should not be used as the sole basis for treatment or other patient management decisions. Negative results must be combined with clinical observations, patient history, and epidemiological information. The expected result is Negative. Fact Sheet for Patients: HairSlick.no Fact Sheet for Healthcare Providers: quierodirigir.com This test is not yet approved or cleared by the [place] FDA and  has been authorized for detection and/or diagnosis of SARS-CoV-2 by FDA under an Emergency Use Authorization (EUA). This EUA will remain  in effect (meaning this test can be used) for the duration of the COVID-19 declaration under Section 56 4(b)(1) of the Act, 21 U.S.C. section 360bbb-3(b)(1), unless the authorization is terminated or revoked sooner. Performed at [facility], [address],  [place], [place] [address]      Time coordinating discharge: 35 minutes  SIGNED:   Glade Lloyd, MD  [facility] 06/04/2019, 1:04 PM

## 2019-06-04 NOTE — TOC Transition Note (Signed)
Transition of Care [facility]) - CM/SW Discharge Note   Patient Details  Name: [name] [name] MRN: [id number] Date of Birth: 1935-05-04  [facility]) CM/SW Contact:  Baldemar Lenis, LCSW Phone Number: 06/04/2019, 2:45 PM   Clinical Narrative:   Nurse to call report to [phone], Room 103.    Final next level of care: Skilled Nursing Facility Barriers to Discharge: Barriers Resolved   Patient Goals and CMS Choice Patient states their goals for this hospitalization and ongoing recovery are:: Patient unable to participate in goal setting due to altered mental status. CMS Medicare.gov Compare Post Acute Care list provided to:: Patient Represenative (must comment)(Patients son, [name]) Choice offered to / list presented to : Adult Children  Discharge Placement              Patient chooses bed at: ([facility]) Patient to be transferred to facility by: [facility] Name of family member notified: [name] Patient and family notified of of transfer: 06/04/19  Discharge Plan and Services     Post Acute Care Choice: Skilled Nursing Facility                               Social Determinants of Health (SDOH) Interventions     Readmission Risk Interventions Readmission Risk Prevention Plan 12/11/2018  Transportation Screening Complete  PCP or Specialist Appt within 3-5 Days Complete  HRI or Home Care Consult Complete  Social Work Consult for Recovery Care Planning/Counseling Complete  Palliative Care Screening Complete  Medication Review Oceanographer) Complete  Some recent data might be hidden

## 2019-06-04 NOTE — Consult Note (Signed)
Consultation Note Date: 06/04/2019   Patient Name: [name] [name]  DOB: 1934/06/03  MRN: [id number]  Age / Sex: 85 y.o., female  PCP: Galvin Proffer, MD Referring Physician: Glade Lloyd, MD  Reason for Consultation: Establishing goals of care  HPI/Patient Profile: [name] [name] is a 85 y.o. female with medical history significant of CAD s/p Stent RCA, poorly controlled DM, CKD III, hypothyroidism, PAF w/ h/o cardioversion, h/o CVA July 2020. She was found by her son today on there floor by her chair. He noted marked change in mental status, weakness more on the left, facial twitch and slurred speech. She was [facility] to [facility]- for [facility]. Patient cannot give a history.  Palliative care was asked to get involved to help aid in goals of care conversations.   Clinical Assessment and Goals of Care: Chart reviewed. Patient evaluated, she was disoriented but in fair spirits. Denies chest pain, shortness of breath or nausea.  Called patients HCPOA, [name] (daughter) who vocalized that she was aware of Palliative care as her father had been on it prior to his passing. Clarified the concepts of Palliative care with her. Asked her how her mother had been in recent years she shared  has lived a good life up until the past few years whereby she started having strokes requiring incremental medical interventions. WE discussed why she had not recently been on anticoagulation whereby her brother, [name] had not received them from the pharmacy. Asked her to consider taking over medications management when her mother gets back home.    As far as the patients living situation prior to admission she was living in her own home. Her son, [name] lived across the lawn in a separate home. [name] [name] was walking, talking, and regulary going out to enjoy her back porch. [name] and his girlfriend did cooking, cleaning, helps patient bath, and  helped her get dressed. Patient was managing own medications. From a spiritual perspective she was raised [employer].  [place] and I discussed in greater detail her recent stroke. [name] shared that her greatest hope is that the patient can get back to a place whereby she is able to live back in her home. We discussed if she should rehabilitate poorly what the plan would be. [name] said that she would want to focus on her mother being comfortable, enjoying the time she has left.  Discussed code status. DNAR/DNI, no feeding tube. Continue current interventions for now with the hope of meaningful recovery.   Decision Maker: [name] (Daughter, POA) - [phone]  SUMMARY OF RECOMMENDATIONS   DNAR/DNI, no feeding tubes Plan for OP Palliative follow Up Chaplain Consult  Code Status/Advance Care Planning:  DNR   Symptom Management:   Goals of Care:  As above, goal of family for for patient to rehabilitate. If she does poorly or endures another acute event they would be open to hospice  Urinary Retention:  Recommend prompted toileting  Bladder scans Q6H  Generalized Weakness:  PT/OT consults   Palliative Prophylaxis:   Aspiration,  Bowel Regimen, Delirium Protocol, Eye Care, Frequent Pain Assessment, Oral Care and Turn Reposition  Additional Recommendations (Limitations, Scope, Preferences):  Full Scope Treatment  Psycho-social/Spiritual:   Desire for further Chaplaincy support:yes  Additional Recommendations: Caregiving  Support/Resources  Prognosis:   Unclear, will depend on rehabilitation course from stroke.  Discharge Planning: Skilled Nursing Facility for rehab with Palliative care service follow-up      Primary Diagnoses: Present on Admission: . CAD (coronary artery disease) . Hypothyroidism . Hypertension . Poorly controlled type 2 diabetes mellitus with circulatory disorder (HCC) . Facial twitching . Slurred speech . CVA (cerebrovascular  accident) (HCC) . Atrial fibrillation (HCC)  I have reviewed the medical record, interviewed the patient and family, and examined the patient. The following aspects are pertinent.  Past Medical History:  Diagnosis Date  . Asthma   . CAD (coronary artery disease)    a. 2000 s/p PCI RCA;  b. 2005 PCI of RCA 2/2 ISR; c. 04/2013 Neg MV;  d. 05/2013 Cath: LM 20, LAD 30p, LCX 20p, OM1 90 small, RCA 58m (PTCA - FFR 0.86), PDA 40.  Marland Kitchen Cerebral infarction (HCC)   . Chronic diastolic CHF (congestive heart failure) (HCC)    a. 10/2013 Echo: EF 60-65%, no rwma, Gr1 DD, mild MR, mildly dil LA.  . CKD (chronic kidney disease), stage III   . DM (diabetes mellitus) (HCC)   . Dyslipidemia   . GERD (gastroesophageal reflux disease)   . Hypertensive heart disease   . Hypothyroidism    hx  . Obesity   . Osteoarthritis   . PAF (paroxysmal atrial fibrillation) (HCC)    a. s/p dccv 04/2014;  b. amio/eliquis;  c. 06/2013 bb/ccb d/c 2/2 symptomatic bradycardia; d. 04/2015 recurrent AF->amio increased/bb resumed.  . Rheumatoid arthritis(714.0)   . Right rib fracture    a. 04/2015.  Marland Kitchen Shoulder fracture, left    a. Dr. Martha Clan   Social History   Socioeconomic History  . Marital status: Married    Spouse name: Not on file  . Number of children: 3  . Years of education: Not on file  . Highest education level: Not on file  Occupational History  . Occupation: retired  Tobacco Use  . Smoking status: Never Smoker  . Smokeless tobacco: Never Used  . Tobacco comment: former passive smoker  Substance and Sexual Activity  . Alcohol use: No  . Drug use: No  . Sexual activity: Never  Other Topics Concern  . Not on file  Social History Narrative   Widow; lives in [place]. Three children. 4 grandchildren; 4 great grandchildren   Regular exercise: yes   Caffeine use: tea occasionally   Social Determinants of Health   Financial Resource Strain:   . Difficulty of Paying Living Expenses: Not on file  Food  Insecurity:   . Worried About Programme researcher, broadcasting/film/video in the Last Year: Not on file  . Ran Out of Food in the Last Year: Not on file  Transportation Needs:   . Lack of Transportation (Medical): Not on file  . Lack of Transportation (Non-Medical): Not on file  Physical Activity:   . Days of Exercise per Week: Not on file  . Minutes of Exercise per Session: Not on file  Stress:   . Feeling of Stress : Not on file  Social Connections:   . Frequency of Communication with Friends and Family: Not on file  . Frequency of Social Gatherings with Friends and Family: Not on file  . Attends  Religious Services: Not on file  . Active Member of Clubs or Organizations: Not on file  . Attends Banker Meetings: Not on file  . Marital Status: Not on file   Family History  Problem Relation Age of Onset  . Esophageal cancer Mother   . Diabetes Mother   . Cancer Mother        esophageal    Scheduled Meds: . aspirin  300 mg Rectal Daily  . atorvastatin  20 mg Oral q1800  . Chlorhexidine Gluconate Cloth  6 each Topical Daily  . docusate sodium  100 mg Oral BID  . enoxaparin (LOVENOX) injection  40 mg Subcutaneous Q24H  . LORazepam  1 mg Intravenous Once  . pantoprazole (PROTONIX) IV  40 mg Intravenous Q24H  . sertraline  50 mg Oral Daily  . sodium chloride flush  10-40 mL Intracatheter Q12H  . sodium chloride flush  3 mL Intravenous Q12H   Continuous Infusions: . sodium chloride    . cefTRIAXone (ROCEPHIN)  IV 1 g (06/03/19 1104)  . phenytoin (DILANTIN) IV 200 mg (06/04/19 0013)   PRN Meds:.sodium chloride, albuterol, dextrose, metoprolol tartrate, Muscle Rub, nitroGLYCERIN, sodium chloride flush, sodium chloride flush Medications Prior to Admission:  Prior to Admission medications   Medication Sig Start Date End Date Taking? Authorizing Provider  amiodarone (PACERONE) 200 MG tablet Take 1 tablet (200 mg total) by mouth daily. 08/15/15  Yes Dunn, Raymon Mutton, PA-C  aspirin EC 81 MG tablet  Take 81 mg by mouth daily.   Yes [provider]  atorvastatin (LIPITOR) 20 MG tablet Take 1 tablet (20 mg total) by mouth daily. Patient taking differently: Take 20 mg by mouth daily at 6 PM.  08/31/15  Yes Shelia Media, MD  ferrous sulfate 325 (65 FE) MG tablet Take 325 mg by mouth daily with breakfast.   Yes [provider]  insulin degludec (TRESIBA FLEXTOUCH) 100 UNIT/ML SOPN FlexTouch Pen Inject 18 Units into the skin daily.   Yes [provider]  insulin lispro (HUMALOG) 100 UNIT/ML injection Inject 2-10 Units into the skin 3 (three) times daily before meals. Per sliding scale. If sugar is is >200 is 2units and if over 400 is 10 units   Yes [provider]  latanoprost (XALATAN) 0.005 % ophthalmic solution Place 1 drop into both eyes at bedtime.    Yes [provider]  levothyroxine (SYNTHROID, LEVOTHROID) 150 MCG tablet Take 1 tablet (150 mcg total) by mouth daily before breakfast. 08/31/15  Yes Shelia Media, MD  meclizine (ANTIVERT) 12.5 MG tablet Take 12.5 mg by mouth 2 (two) times daily as needed for dizziness.   Yes [provider]  metFORMIN (GLUCOPHAGE) 500 MG tablet Take 500 mg by mouth 2 (two) times daily with a meal.   Yes [provider]  nitroGLYCERIN (NITROSTAT) 0.4 MG SL tablet Place 0.4 mg under the tongue every 5 (five) minutes as needed for chest pain.   Yes [provider]  omeprazole (PRILOSEC) 20 MG capsule Take 1 capsule (20 mg total) by mouth 2 (two) times daily. Patient taking differently: Take 20 mg by mouth daily.  10/20/15 12/01/27 Yes Shelia Media, MD  potassium chloride SA (K-DUR) 20 MEQ tablet Take 1 tablet (20 mEq total) by mouth 2 (two) times daily as needed (When taking loop diuretic for volume overload). 12/16/18  Yes Beola Cord, MD  sertraline (ZOLOFT) 50 MG tablet Take 1 tablet (50 mg total) by mouth daily. 05/27/14  Yes Jackolyn Confer, MD  albuterol (PROVENTIL  HFA;VENTOLIN HFA) 108 (90 Base) MCG/ACT inhaler Inhale 2 puffs into the lungs every 6 (six) hours as needed for wheezing or shortness of breath. 07/27/15   Loletha Grayer, MD   Allergies  Allergen Reactions  . Oxycodone Nausea And Vomiting   Review of Systems  Constitutional: Positive for activity change.  HENT: Negative.   Eyes: Negative.   Respiratory: Negative.   Cardiovascular: Negative.   Gastrointestinal: Negative.   Endocrine: Negative.   Genitourinary: Negative.   Musculoskeletal: Negative.   Skin: Negative.   Allergic/Immunologic: Negative.   Neurological: Positive for speech difficulty.  Hematological: Negative.   Psychiatric/Behavioral: Negative.    Physical Exam Vitals and nursing note reviewed.  HENT:     Head: Normocephalic.     Nose: Nose normal.     Mouth/Throat:     Mouth: Mucous membranes are dry.  Eyes:     Pupils: Pupils are equal, round, and reactive to light.  Cardiovascular:     Rate and Rhythm: Rhythm irregular.     Pulses: Normal pulses.  Pulmonary:     Effort: Pulmonary effort is normal.  Abdominal:     General: Bowel sounds are normal.  Musculoskeletal:     Cervical back: Normal range of motion.  Skin:    General: Skin is warm.     Capillary Refill: Capillary refill takes less than 2 seconds.  Neurological:     General: No focal deficit present.     Mental Status: She is alert.  Psychiatric:        Mood and Affect: Mood normal.    Vital Signs: BP (!) 146/57 (BP Location: Left Arm)   Pulse 61   Temp (!) 97.5 F (36.4 C) (Oral)   Resp 16   Wt 69.2 kg   SpO2 100%   BMI 27.02 kg/m  Pain Scale: 0-10   Pain Score: 4   SpO2: SpO2: 100 % O2 Device:SpO2: 100 % O2 Flow Rate: .   IO: Intake/output summary:   Intake/Output Summary (Last 24 hours) at 06/04/2019 0718 Last data filed at 06/04/2019 0229 Gross per 24 hour  Intake 987.1 ml  Output 1300 ml  Net -312.9 ml   LBM: Last BM Date: 06/03/19 Baseline Weight: Weight: 82  kg Most recent weight: Weight: 69.2 kg     Palliative Assessment/Data: 40%   Time In: 0645 Time Out: 0800 Time Total: 75 Greater than 50%  of this time was spent counseling and coordinating care related to the above assessment and plan.  Signed by: Rosezella Rumpf, NP   Please contact Palliative Medicine Team phone at [phone] for questions and concerns.  For individual provider: See Shea Evans

## 2019-06-04 NOTE — Progress Notes (Signed)
Physical Therapy Treatment Patient Details Name: [name] [name] MRN: [id number] DOB: 1934/06/29 Today's Date: 06/04/2019    History of Present Illness [name] [name] is a 85 y.o. female with medical history significant of CAD s/p Stent RCA, poorly controlled DM, CKD III, hypothyroidism, PAF w/ h/o cardioversion, h/o CVA July 2020. She was found by her son today on there floor by her chair. He noted marked change in mental status, weakness more on the left, facial twitch and slurred speech. Upon admission, found to be in atrial fibrillation. CT head negative for acute abnormality. EEG negative for seizures. Pt with several noted episodes of hypoglycemia.      PT Comments    Pt supine in bed sleeping.  She was easy to wake and agreeable to PT session.  Pt performed rolling to R side with moderate assistance.  She was able to rise to standing and pivot hips from bed to recliner.  Pt seated eating lunch on arrival.  She has progressed from last session and remains appropriate for skilled rehab at SNF before returning home.    Follow Up Recommendations  SNF;Supervision/Assistance - 24 hour     Equipment Recommendations  Other (comment)(TBA)    Recommendations for Other Services       Precautions / Restrictions Precautions Precautions: Fall Restrictions Weight Bearing Restrictions: No    Mobility  Bed Mobility Overal bed mobility: Needs Assistance Bed Mobility: Rolling;Sidelying to Sit Rolling: Mod assist Sidelying to sit: Mod assist;HOB elevated       General bed mobility comments: Moderate assistance to roll with hand over hand guidance to reach for railing.  Assistance to advance LEs to edge of bed,  Pt able to follow command to elevate trunk into sitting with PTA elevating HOB.  Transfers Overall transfer level: Needs assistance Equipment used: Rolling walker (2 wheeled) Transfers: Stand Pivot Transfers Sit to Stand: Mod assist;From elevated surface Stand pivot  transfers: Mod assist;From elevated surface       General transfer comment: Flexed upper trunk but able to stand and pivot from bed to recliner.  Pt is slow and guarded but does follow commands.  Ambulation/Gait Ambulation/Gait assistance: (NT)               Stairs             Wheelchair Mobility    Modified Rankin (Stroke Patients Only)       Balance Overall balance assessment: Needs assistance Sitting-balance support: Feet supported Sitting balance-Leahy Scale: Poor Sitting balance - Comments: requires maxA for sitting balance, minimal balance reactions                                    Cognition Arousal/Alertness: Awake/alert Behavior During Therapy: Flat affect Overall Cognitive Status: Difficult to assess                                 General Comments: Pt alert during session and followed commands to participate, she was very minimally conversive.      Exercises      General Comments        Pertinent Vitals/Pain Pain Assessment: No/denies pain Faces Pain Scale: Hurts a little bit Pain Location: B feet/ ankles. Pain Descriptors / Indicators: Grimacing;Guarding Pain Intervention(s): Monitored during session;Repositioned    Home Living  Prior Function            PT Goals (current goals can now be found in the care plan section) Acute Rehab PT Goals Patient Stated Goal: unable Potential to Achieve Goals: Fair Progress towards PT goals: Progressing toward goals    Frequency    Min 2X/week      PT Plan Current plan remains appropriate    Co-evaluation              AM-PAC PT "6 Clicks" Mobility   Outcome Measure  Help needed turning from your back to your side while in a flat bed without using bedrails?: A Lot Help needed moving from lying on your back to sitting on the side of a flat bed without using bedrails?: A Lot Help needed moving to and from a bed to a  chair (including a wheelchair)?: A Lot Help needed standing up from a chair using your arms (e.g., wheelchair or bedside chair)?: A Lot Help needed to walk in hospital room?: Total Help needed climbing 3-5 steps with a railing? : Total 6 Click Score: 10    End of Session Equipment Utilized During Treatment: Gait belt Activity Tolerance: Patient limited by lethargy Patient left: in bed;with call bell/phone within reach;with bed alarm set Nurse Communication: Mobility status PT Visit Diagnosis: Other abnormalities of gait and mobility (R26.89)     Time: 1207-1224 PT Time Calculation (min) (ACUTE ONLY): 17 min  Charges:  $Therapeutic Activity: 8-22 mins                     Bonney Leitz , PTA Acute Rehabilitation Services Pager [phone] Office [phone]     Shalita Notte Artis Delay 06/04/2019, 12:35 PM

## 2019-06-10 ENCOUNTER — Ambulatory Visit: Payer: Commercial Managed Care - HMO | Admitting: Neurology

## 2019-06-30 ENCOUNTER — Encounter: Payer: Self-pay | Admitting: Neurology

## 2019-06-30 ENCOUNTER — Ambulatory Visit (INDEPENDENT_AMBULATORY_CARE_PROVIDER_SITE_OTHER): Payer: Medicare Other | Admitting: Neurology

## 2019-06-30 ENCOUNTER — Other Ambulatory Visit: Payer: Self-pay

## 2019-06-30 VITALS — BP 130/68 | HR 78 | Temp 97.3°F | Ht 62.0 in

## 2019-06-30 DIAGNOSIS — I639 Cerebral infarction, unspecified: Secondary | ICD-10-CM | POA: Diagnosis not present

## 2019-06-30 DIAGNOSIS — F0150 Vascular dementia without behavioral disturbance: Secondary | ICD-10-CM | POA: Diagnosis not present

## 2019-06-30 DIAGNOSIS — R569 Unspecified convulsions: Secondary | ICD-10-CM | POA: Diagnosis not present

## 2019-06-30 NOTE — Progress Notes (Signed)
[facility] [address] [place]. [place] [address] [phone]       OFFICE CONSULT NOTE  [name] Date of Birth:  Mar 21, 1935 Medical Record Number:  [id number]   Referring MD: Dolores Lory Reason for Referral:  Stroke and seizure  HPI: [name] [name] is a 85 year old Caucasian lady seen today as a referral from nursing home for initial office consultation visit for stroke and seizures.  Patient is unable to provide history which was obtained from review of electronic medical records and I personally reviewed imaging films in PACS.  She has past medical history of coronary artery disease, hypertension, chronic kidney disease, diastolic heart failure, paroxysmal A. fib and multiple by cerebral infarcts.  She presented on 06/01/2019 to [facility] for altered mental status with left facial droop and slurred speech and was witnessed to have focal twitching from the left side.  She was given Ativan and then fosphenytoin load and twitching stopped.  EEG was obtained twice but on both occasions showed mild diffuse encephalopathy and slowing no ongoing seizures.  MRI scan showed a small right superior cerebellar embolic infarct.  In the past she had history of right MCA branch infarct with petechial hemorrhage in 12/03/2018 while she was on Eliquis.  She had also had an old left MCA infarct prior to that.  Patient was felt to be a poor anticoagulation candidate given fall risk and dementia and hence Eliquis was stopped.  Patient had Plavix added to her aspirin during the recent admission in January and she was discharged to nursing facility where she is living.  She is not accompanied by anybody today.  Patient tells me that she is is essentially in a wheelchair and total care.  She is incontinent.  She wears diapers.  She is unable to get up and walk even with assistance.  She has had no further seizures or any increased weakness.  She is on aspirin and Plavix but complains  of increasing bruising on the skin ROS:   14 system review of systems is positive for stroke, twitching, seizures, memory loss, difficulty walking, incontinence and all other systems negative . PMH:  Past Medical History:  Diagnosis Date   Asthma    CAD (coronary artery disease)    a. 2000 s/p PCI RCA;  b. 2005 PCI of RCA 2/2 ISR; c. 04/2013 Neg MV;  d. 05/2013 Cath: LM 20, LAD 30p, LCX 20p, OM1 90 small, RCA 31m (PTCA - FFR 0.86), PDA 40.   Cerebral infarction (HCC)    Chronic diastolic CHF (congestive heart failure) (HCC)    a. 10/2013 Echo: EF 60-65%, no rwma, Gr1 DD, mild MR, mildly dil LA.   CKD (chronic kidney disease), stage III    DM (diabetes mellitus) (HCC)    Dyslipidemia    GERD (gastroesophageal reflux disease)    Hypertensive heart disease    Hypothyroidism    hx   Obesity    Osteoarthritis    PAF (paroxysmal atrial fibrillation) (HCC)    a. s/p dccv 04/2014;  b. amio/eliquis;  c. 06/2013 bb/ccb d/c 2/2 symptomatic bradycardia; d. 04/2015 recurrent AF->amio increased/bb resumed.   Rheumatoid arthritis(714.0)    Right rib fracture    a. 04/2015.   Shoulder fracture, left    a. Dr. Martha Clan    Social History:  Social History   Socioeconomic History   Marital status: Married    Spouse name: Not on file   Number of children: 3   Years  of education: Not on file   Highest education level: Not on file  Occupational History   Occupation: retired  Tobacco Use   Smoking status: Never Smoker   Smokeless tobacco: Never Used   Tobacco comment: former passive smoker  Substance and Sexual Activity   Alcohol use: No   Drug use: No   Sexual activity: Never  Other Topics Concern   Not on file  Social History Narrative   Widow; lives in [place]. Three children. 4 grandchildren; 4 great grandchildren   Regular exercise: yes   Caffeine use: tea occasionally   Social Determinants of Health   Financial Resource Strain:    Difficulty of  Paying Living Expenses: Not on file  Food Insecurity:    Worried About Programme researcher, broadcasting/film/video in the Last Year: Not on file   [employer] of Food in the Last Year: Not on file  Transportation Needs:    Lack of Transportation (Medical): Not on file   Lack of Transportation (Non-Medical): Not on file  Physical Activity:    Days of Exercise per Week: Not on file   Minutes of Exercise per Session: Not on file  Stress:    Feeling of Stress : Not on file  Social Connections:    Frequency of Communication with Friends and Family: Not on file   Frequency of Social Gatherings with Friends and Family: Not on file   Attends Religious Services: Not on file   Active Member of Clubs or Organizations: Not on file   Attends Banker Meetings: Not on file   Marital Status: Not on file  Intimate Partner Violence:    Fear of Current or Ex-Partner: Not on file   Emotionally Abused: Not on file   Physically Abused: Not on file   Sexually Abused: Not on file    Medications:   Current Outpatient Medications on File Prior to Visit  Medication Sig Dispense Refill   albuterol (PROVENTIL HFA;VENTOLIN HFA) 108 (90 Base) MCG/ACT inhaler Inhale 2 puffs into the lungs every 6 (six) hours as needed for wheezing or shortness of breath. 1 Inhaler 0   amiodarone (PACERONE) 200 MG tablet Take 1 tablet (200 mg total) by mouth daily. 30 tablet 3   aspirin 325 MG tablet Take 1 tablet (325 mg total) by mouth daily. 30 tablet 0   atorvastatin (LIPITOR) 80 MG tablet Take 1 tablet (80 mg total) by mouth daily at 6 PM. 30 tablet 0   ciprofloxacin (CIPRO) 250 MG tablet Take 250 mg by mouth 2 (two) times daily.     clopidogrel (PLAVIX) 75 MG tablet Take 1 tablet (75 mg total) by mouth daily. 30 tablet 0   ferrous sulfate 325 (65 FE) MG tablet Take 325 mg by mouth daily with breakfast.     HUMALOG KWIKPEN 100 UNIT/ML KwikPen      insulin degludec (TRESIBA FLEXTOUCH) 100 UNIT/ML SOPN FlexTouch  Pen Inject 18 Units into the skin daily.     insulin lispro (HUMALOG) 100 UNIT/ML injection Inject 2-10 Units into the skin 3 (three) times daily before meals. Per sliding scale. If sugar is is >200 is 2units and if over 400 is 10 units     latanoprost (XALATAN) 0.005 % ophthalmic solution Place 1 drop into both eyes at bedtime.      levothyroxine (SYNTHROID, LEVOTHROID) 150 MCG tablet Take 1 tablet (150 mcg total) by mouth daily before breakfast. 90 tablet 1   meclizine (ANTIVERT) 12.5 MG tablet Take 12.5 mg  by mouth 2 (two) times daily as needed for dizziness.     metFORMIN (GLUCOPHAGE) 500 MG tablet Take 500 mg by mouth 2 (two) times daily with a meal.     nitroGLYCERIN (NITROSTAT) 0.4 MG SL tablet Place 0.4 mg under the tongue every 5 (five) minutes as needed for chest pain.     omeprazole (PRILOSEC) 20 MG capsule Take 1 capsule (20 mg total) by mouth daily for 5000 doses.     phenytoin (DILANTIN) 200 MG ER capsule Take 1 capsule (200 mg total) by mouth 2 (two) times daily. 60 capsule 0   senna (SENOKOT) 8.6 MG TABS tablet Take 1 tablet (8.6 mg total) by mouth at bedtime as needed for mild constipation. 10 tablet 0   sertraline (ZOLOFT) 50 MG tablet Take 1 tablet (50 mg total) by mouth daily. 90 tablet 0   VITAMIN D PO Take 125 mg by mouth.     No current facility-administered medications on file prior to visit.    Allergies:   Allergies  Allergen Reactions   Oxycodone Nausea And Vomiting    Physical Exam General: Frail elderly Caucasian lady, seated, in no evident distress Head: head normocephalic and atraumatic.   Neck: supple with no carotid or supraclavicular bruits Cardiovascular: regular rate and rhythm, no murmurs Musculoskeletal: no deformity Skin:  no rash but multiple scattered petechiae in upper and lower extremities. Vascular:  Normal pulses all extremities  Neurologic Exam Mental Status: Awake and fully alert. Oriented to person only.. Recent and remote  memory poor. Attention span, concentration and fund of knowledge markedly diminished.  Follows only simple midline and occasional one-step commands.  Mood and affect appropriate.  Able to name only 2 animals which can walk on 4 legs.  Recall 0/3.  Unable to draw clock. Cranial Nerves: Fundoscopic exam not done. Pupils equal, briskly reactive to light. Extraocular movements full without nystagmus. Visual fields full to confrontation. Hearing intact. Facial sensation intact. Face, tongue, palate moves normally and symmetrically.  Motor: Normal bulk and tone. Normal strength in all tested extremity muscles but cooperation is limited.. Sensory.: intact to touch , pinprick , position and vibratory sensation.  Coordination: Rapid alternating movements normal in all extremities. Finger-to-nose and heel-to-shin performed accurately bilaterally. Gait and Station: Unable to test as patient is in a wheelchair and does not walk. Reflexes: 1+ and symmetric. Toes downgoing.     Modified Rankin  4  ASSESSMENT: 85 year old Caucasian lady with history of by cerebral embolic infarcts from A. fib in the past with recent admission in January 2021 for focal seizures with MRI showing small likely incidental cerebellar embolic infarct.  She has had bilateral hemorrhagic infarct in the past despite being on Eliquis and has not been considered a long-term good anticoagulation candidate due to dementia fall risk and failure of anticoagulation.     PLAN: I had a long discussion with the patient regarding her recent hospitalization for stroke and seizures and reviewed her chart and prior history of strokes and hospitalization.  She is not a good long-term candidate for anticoagulation for A. fib and multiple strokes given her significant vascular dementia as well as fall risk and prior history of hemorrhagic infarcts despite being on Eliquis.  I recommend she discontinue Plavix and stay on aspirin 325 mg daily alone due to  significant bruising.  Continue Dilantin ER 200 mg twice daily for seizures.  Continue aggressive risk factor modification.  Continue ongoing care at nursing home and clearly she cannot be cared  for at home.  Greater than 50% time during this 45-minute consultation visit was spent on counseling and coordination of care about her embolic strokes and seizures and dementia.  Follow-up in the future only as needed and no schedule appointment was made. Antony Contras, MD Note: This document was prepared with digital dictation and possible smart phrase technology. Any transcriptional errors that result from this process are unintentional.

## 2019-06-30 NOTE — Patient Instructions (Addendum)
I had a long discussion with the patient regarding her recent hospitalization for stroke and seizures and reviewed her chart and prior history of strokes and hospitalization.  She is not a good long-term candidate for anticoagulation for A. fib and multiple strokes given her significant vascular dementia as well as fall risk and prior history of hemorrhagic infarcts despite being on Eliquis.  I recommend she discontinue Plavix and stay on aspirin 325 mg daily alone due to significant bruising.  Continue Dilantin ER 200 mg twice daily for seizures.  Continue aggressive risk factor modification.  Continue ongoing care at nursing home and clearly she cannot be cared for at home.  Follow-up in the future only as needed and no schedule appointment was made.

## 2019-07-19 DIAGNOSIS — R1314 Dysphagia, pharyngoesophageal phase: Secondary | ICD-10-CM | POA: Diagnosis not present

## 2019-07-19 DIAGNOSIS — R262 Difficulty in walking, not elsewhere classified: Secondary | ICD-10-CM | POA: Diagnosis not present

## 2019-07-19 DIAGNOSIS — Z741 Need for assistance with personal care: Secondary | ICD-10-CM | POA: Diagnosis not present

## 2019-07-19 DIAGNOSIS — R278 Other lack of coordination: Secondary | ICD-10-CM | POA: Diagnosis not present

## 2019-07-19 DIAGNOSIS — Z03818 Encounter for observation for suspected exposure to other biological agents ruled out: Secondary | ICD-10-CM | POA: Diagnosis not present

## 2019-07-20 DIAGNOSIS — Z741 Need for assistance with personal care: Secondary | ICD-10-CM | POA: Diagnosis not present

## 2019-07-20 DIAGNOSIS — R278 Other lack of coordination: Secondary | ICD-10-CM | POA: Diagnosis not present

## 2019-07-20 DIAGNOSIS — R1314 Dysphagia, pharyngoesophageal phase: Secondary | ICD-10-CM | POA: Diagnosis not present

## 2019-07-20 DIAGNOSIS — R262 Difficulty in walking, not elsewhere classified: Secondary | ICD-10-CM | POA: Diagnosis not present

## 2019-07-21 DIAGNOSIS — R1314 Dysphagia, pharyngoesophageal phase: Secondary | ICD-10-CM | POA: Diagnosis not present

## 2019-07-21 DIAGNOSIS — R262 Difficulty in walking, not elsewhere classified: Secondary | ICD-10-CM | POA: Diagnosis not present

## 2019-07-21 DIAGNOSIS — R278 Other lack of coordination: Secondary | ICD-10-CM | POA: Diagnosis not present

## 2019-07-21 DIAGNOSIS — Z741 Need for assistance with personal care: Secondary | ICD-10-CM | POA: Diagnosis not present

## 2019-07-22 DIAGNOSIS — Z741 Need for assistance with personal care: Secondary | ICD-10-CM | POA: Diagnosis not present

## 2019-07-22 DIAGNOSIS — R278 Other lack of coordination: Secondary | ICD-10-CM | POA: Diagnosis not present

## 2019-07-22 DIAGNOSIS — R262 Difficulty in walking, not elsewhere classified: Secondary | ICD-10-CM | POA: Diagnosis not present

## 2019-07-22 DIAGNOSIS — R1314 Dysphagia, pharyngoesophageal phase: Secondary | ICD-10-CM | POA: Diagnosis not present

## 2019-07-23 DIAGNOSIS — R1314 Dysphagia, pharyngoesophageal phase: Secondary | ICD-10-CM | POA: Diagnosis not present

## 2019-07-23 DIAGNOSIS — Z741 Need for assistance with personal care: Secondary | ICD-10-CM | POA: Diagnosis not present

## 2019-07-23 DIAGNOSIS — R262 Difficulty in walking, not elsewhere classified: Secondary | ICD-10-CM | POA: Diagnosis not present

## 2019-07-23 DIAGNOSIS — R278 Other lack of coordination: Secondary | ICD-10-CM | POA: Diagnosis not present

## 2019-07-25 DIAGNOSIS — R278 Other lack of coordination: Secondary | ICD-10-CM | POA: Diagnosis not present

## 2019-07-25 DIAGNOSIS — R262 Difficulty in walking, not elsewhere classified: Secondary | ICD-10-CM | POA: Diagnosis not present

## 2019-07-25 DIAGNOSIS — Z741 Need for assistance with personal care: Secondary | ICD-10-CM | POA: Diagnosis not present

## 2019-07-25 DIAGNOSIS — R1314 Dysphagia, pharyngoesophageal phase: Secondary | ICD-10-CM | POA: Diagnosis not present

## 2019-07-26 DIAGNOSIS — R262 Difficulty in walking, not elsewhere classified: Secondary | ICD-10-CM | POA: Diagnosis not present

## 2019-07-26 DIAGNOSIS — Z03818 Encounter for observation for suspected exposure to other biological agents ruled out: Secondary | ICD-10-CM | POA: Diagnosis not present

## 2019-07-26 DIAGNOSIS — R278 Other lack of coordination: Secondary | ICD-10-CM | POA: Diagnosis not present

## 2019-07-26 DIAGNOSIS — R1314 Dysphagia, pharyngoesophageal phase: Secondary | ICD-10-CM | POA: Diagnosis not present

## 2019-07-26 DIAGNOSIS — Z741 Need for assistance with personal care: Secondary | ICD-10-CM | POA: Diagnosis not present

## 2019-07-27 DIAGNOSIS — Z741 Need for assistance with personal care: Secondary | ICD-10-CM | POA: Diagnosis not present

## 2019-07-27 DIAGNOSIS — R278 Other lack of coordination: Secondary | ICD-10-CM | POA: Diagnosis not present

## 2019-07-27 DIAGNOSIS — R1314 Dysphagia, pharyngoesophageal phase: Secondary | ICD-10-CM | POA: Diagnosis not present

## 2019-07-27 DIAGNOSIS — R262 Difficulty in walking, not elsewhere classified: Secondary | ICD-10-CM | POA: Diagnosis not present

## 2019-07-28 DIAGNOSIS — G301 Alzheimer's disease with late onset: Secondary | ICD-10-CM | POA: Diagnosis not present

## 2019-07-28 DIAGNOSIS — B9729 Other coronavirus as the cause of diseases classified elsewhere: Secondary | ICD-10-CM | POA: Diagnosis not present

## 2019-08-02 DIAGNOSIS — Z03818 Encounter for observation for suspected exposure to other biological agents ruled out: Secondary | ICD-10-CM | POA: Diagnosis not present

## 2019-08-02 DIAGNOSIS — E1122 Type 2 diabetes mellitus with diabetic chronic kidney disease: Secondary | ICD-10-CM | POA: Diagnosis not present

## 2019-08-02 DIAGNOSIS — I5022 Chronic systolic (congestive) heart failure: Secondary | ICD-10-CM | POA: Diagnosis not present

## 2019-08-02 DIAGNOSIS — B9729 Other coronavirus as the cause of diseases classified elsewhere: Secondary | ICD-10-CM | POA: Diagnosis not present

## 2019-08-02 DIAGNOSIS — I110 Hypertensive heart disease with heart failure: Secondary | ICD-10-CM | POA: Diagnosis not present

## 2019-08-03 DIAGNOSIS — N179 Acute kidney failure, unspecified: Secondary | ICD-10-CM | POA: Diagnosis not present

## 2019-08-03 DIAGNOSIS — D649 Anemia, unspecified: Secondary | ICD-10-CM | POA: Diagnosis not present

## 2019-08-04 DIAGNOSIS — Z66 Do not resuscitate: Secondary | ICD-10-CM | POA: Diagnosis not present

## 2019-08-04 DIAGNOSIS — U071 COVID-19: Secondary | ICD-10-CM | POA: Diagnosis not present

## 2019-08-04 DIAGNOSIS — R0902 Hypoxemia: Secondary | ICD-10-CM | POA: Diagnosis not present

## 2019-08-04 DIAGNOSIS — K8020 Calculus of gallbladder without cholecystitis without obstruction: Secondary | ICD-10-CM | POA: Diagnosis not present

## 2019-08-04 DIAGNOSIS — M2550 Pain in unspecified joint: Secondary | ICD-10-CM | POA: Diagnosis not present

## 2019-08-04 DIAGNOSIS — L89159 Pressure ulcer of sacral region, unspecified stage: Secondary | ICD-10-CM | POA: Diagnosis not present

## 2019-08-04 DIAGNOSIS — Z515 Encounter for palliative care: Secondary | ICD-10-CM | POA: Diagnosis not present

## 2019-08-04 DIAGNOSIS — R1310 Dysphagia, unspecified: Secondary | ICD-10-CM | POA: Diagnosis not present

## 2019-08-04 DIAGNOSIS — N182 Chronic kidney disease, stage 2 (mild): Secondary | ICD-10-CM | POA: Diagnosis not present

## 2019-08-04 DIAGNOSIS — N189 Chronic kidney disease, unspecified: Secondary | ICD-10-CM | POA: Diagnosis not present

## 2019-08-04 DIAGNOSIS — E860 Dehydration: Secondary | ICD-10-CM | POA: Diagnosis not present

## 2019-08-04 DIAGNOSIS — I480 Paroxysmal atrial fibrillation: Secondary | ICD-10-CM | POA: Diagnosis not present

## 2019-08-04 DIAGNOSIS — E11622 Type 2 diabetes mellitus with other skin ulcer: Secondary | ICD-10-CM | POA: Diagnosis not present

## 2019-08-04 DIAGNOSIS — K449 Diaphragmatic hernia without obstruction or gangrene: Secondary | ICD-10-CM | POA: Diagnosis not present

## 2019-08-04 DIAGNOSIS — I2721 Secondary pulmonary arterial hypertension: Secondary | ICD-10-CM | POA: Diagnosis not present

## 2019-08-04 DIAGNOSIS — R531 Weakness: Secondary | ICD-10-CM | POA: Diagnosis not present

## 2019-08-04 DIAGNOSIS — E1122 Type 2 diabetes mellitus with diabetic chronic kidney disease: Secondary | ICD-10-CM | POA: Diagnosis not present

## 2019-08-04 DIAGNOSIS — R918 Other nonspecific abnormal finding of lung field: Secondary | ICD-10-CM | POA: Diagnosis not present

## 2019-08-04 DIAGNOSIS — K5730 Diverticulosis of large intestine without perforation or abscess without bleeding: Secondary | ICD-10-CM | POA: Diagnosis not present

## 2019-08-04 DIAGNOSIS — M069 Rheumatoid arthritis, unspecified: Secondary | ICD-10-CM | POA: Diagnosis not present

## 2019-08-04 DIAGNOSIS — G9341 Metabolic encephalopathy: Secondary | ICD-10-CM | POA: Diagnosis not present

## 2019-08-04 DIAGNOSIS — Z7189 Other specified counseling: Secondary | ICD-10-CM | POA: Diagnosis not present

## 2019-08-04 DIAGNOSIS — N3000 Acute cystitis without hematuria: Secondary | ICD-10-CM | POA: Diagnosis not present

## 2019-08-04 DIAGNOSIS — N179 Acute kidney failure, unspecified: Secondary | ICD-10-CM | POA: Diagnosis not present

## 2019-08-04 DIAGNOSIS — K828 Other specified diseases of gallbladder: Secondary | ICD-10-CM | POA: Diagnosis not present

## 2019-08-04 DIAGNOSIS — L89529 Pressure ulcer of left ankle, unspecified stage: Secondary | ICD-10-CM | POA: Diagnosis not present

## 2019-08-04 DIAGNOSIS — Z7401 Bed confinement status: Secondary | ICD-10-CM | POA: Diagnosis not present

## 2019-08-04 DIAGNOSIS — I69391 Dysphagia following cerebral infarction: Secondary | ICD-10-CM | POA: Diagnosis not present

## 2019-08-04 DIAGNOSIS — I482 Chronic atrial fibrillation, unspecified: Secondary | ICD-10-CM | POA: Diagnosis not present

## 2019-08-04 DIAGNOSIS — M6282 Rhabdomyolysis: Secondary | ICD-10-CM | POA: Diagnosis not present

## 2019-08-04 DIAGNOSIS — J1282 Pneumonia due to coronavirus disease 2019: Secondary | ICD-10-CM | POA: Diagnosis not present

## 2019-08-04 DIAGNOSIS — R5381 Other malaise: Secondary | ICD-10-CM | POA: Diagnosis not present

## 2019-08-04 DIAGNOSIS — I5032 Chronic diastolic (congestive) heart failure: Secondary | ICD-10-CM | POA: Diagnosis not present

## 2019-08-04 DIAGNOSIS — E039 Hypothyroidism, unspecified: Secondary | ICD-10-CM | POA: Diagnosis not present

## 2019-08-04 DIAGNOSIS — L89519 Pressure ulcer of right ankle, unspecified stage: Secondary | ICD-10-CM | POA: Diagnosis not present

## 2019-08-04 DIAGNOSIS — Z743 Need for continuous supervision: Secondary | ICD-10-CM | POA: Diagnosis not present

## 2019-08-04 DIAGNOSIS — I2510 Atherosclerotic heart disease of native coronary artery without angina pectoris: Secondary | ICD-10-CM | POA: Diagnosis not present

## 2019-08-04 DIAGNOSIS — I130 Hypertensive heart and chronic kidney disease with heart failure and stage 1 through stage 4 chronic kidney disease, or unspecified chronic kidney disease: Secondary | ICD-10-CM | POA: Diagnosis not present

## 2019-08-19 DEATH — deceased
